# Patient Record
Sex: Female | Born: 1955 | Race: Black or African American | Hispanic: No | Marital: Married | Smoking: Never smoker
Health system: Southern US, Community
[De-identification: ages and names within clinical notes are randomized; demographics above are authoritative.]

## PROBLEM LIST (undated history)

## (undated) DIAGNOSIS — I5032 Chronic diastolic (congestive) heart failure: Secondary | ICD-10-CM

## (undated) DIAGNOSIS — I00 Rheumatic fever without heart involvement: Secondary | ICD-10-CM

## (undated) DIAGNOSIS — I071 Rheumatic tricuspid insufficiency: Secondary | ICD-10-CM

## (undated) DIAGNOSIS — I4819 Other persistent atrial fibrillation: Secondary | ICD-10-CM

## (undated) DIAGNOSIS — Z9289 Personal history of other medical treatment: Secondary | ICD-10-CM

## (undated) DIAGNOSIS — R0989 Other specified symptoms and signs involving the circulatory and respiratory systems: Secondary | ICD-10-CM

## (undated) DIAGNOSIS — K621 Rectal polyp: Secondary | ICD-10-CM

## (undated) DIAGNOSIS — E669 Obesity, unspecified: Secondary | ICD-10-CM

## (undated) DIAGNOSIS — Z954 Presence of other heart-valve replacement: Secondary | ICD-10-CM

## (undated) DIAGNOSIS — I700 Atherosclerosis of aorta: Secondary | ICD-10-CM

## (undated) DIAGNOSIS — I10 Essential (primary) hypertension: Secondary | ICD-10-CM

## (undated) DIAGNOSIS — R9389 Abnormal findings on diagnostic imaging of other specified body structures: Secondary | ICD-10-CM

## (undated) DIAGNOSIS — Z9889 Other specified postprocedural states: Secondary | ICD-10-CM

## (undated) DIAGNOSIS — E7800 Pure hypercholesterolemia, unspecified: Secondary | ICD-10-CM

## (undated) DIAGNOSIS — R9430 Abnormal result of cardiovascular function study, unspecified: Secondary | ICD-10-CM

## (undated) DIAGNOSIS — I4892 Unspecified atrial flutter: Secondary | ICD-10-CM

## (undated) DIAGNOSIS — Z923 Personal history of irradiation: Secondary | ICD-10-CM

## (undated) DIAGNOSIS — I4891 Unspecified atrial fibrillation: Secondary | ICD-10-CM

## (undated) DIAGNOSIS — R0602 Shortness of breath: Secondary | ICD-10-CM

## (undated) DIAGNOSIS — R011 Cardiac murmur, unspecified: Secondary | ICD-10-CM

## (undated) DIAGNOSIS — IMO0002 Reserved for concepts with insufficient information to code with codable children: Secondary | ICD-10-CM

## (undated) DIAGNOSIS — D649 Anemia, unspecified: Secondary | ICD-10-CM

## (undated) DIAGNOSIS — G4730 Sleep apnea, unspecified: Secondary | ICD-10-CM

## (undated) DIAGNOSIS — I517 Cardiomegaly: Secondary | ICD-10-CM

## (undated) DIAGNOSIS — Z8679 Personal history of other diseases of the circulatory system: Secondary | ICD-10-CM

## (undated) DIAGNOSIS — I340 Nonrheumatic mitral (valve) insufficiency: Secondary | ICD-10-CM

## (undated) DIAGNOSIS — C55 Malignant neoplasm of uterus, part unspecified: Secondary | ICD-10-CM

## (undated) DIAGNOSIS — J9801 Acute bronchospasm: Secondary | ICD-10-CM

## (undated) HISTORY — DX: Abnormal findings on diagnostic imaging of other specified body structures: R93.89

## (undated) HISTORY — DX: Acute bronchospasm: J98.01

## (undated) HISTORY — DX: Rheumatic tricuspid insufficiency: I07.1

## (undated) HISTORY — PX: TUBAL LIGATION: SHX77

## (undated) HISTORY — PX: CARDIAC CATHETERIZATION: SHX172

## (undated) HISTORY — DX: Personal history of irradiation: Z92.3

## (undated) HISTORY — DX: Obesity, unspecified: E66.9

## (undated) HISTORY — DX: Cardiomegaly: I51.7

## (undated) HISTORY — DX: Other specified symptoms and signs involving the circulatory and respiratory systems: R09.89

## (undated) HISTORY — DX: Pure hypercholesterolemia, unspecified: E78.00

## (undated) HISTORY — PX: NO PAST SURGERIES: SHX2092

## (undated) HISTORY — DX: Rheumatic fever without heart involvement: I00

## (undated) HISTORY — DX: Chronic diastolic (congestive) heart failure: I50.32

## (undated) HISTORY — DX: Cardiac murmur, unspecified: R01.1

## (undated) HISTORY — DX: Other persistent atrial fibrillation: I48.19

## (undated) HISTORY — DX: Rectal polyp: K62.1

## (undated) HISTORY — DX: Unspecified atrial fibrillation: I48.91

---

## 1996-01-11 DIAGNOSIS — J9801 Acute bronchospasm: Secondary | ICD-10-CM

## 1996-01-11 HISTORY — DX: Acute bronchospasm: J98.01

## 1999-02-16 ENCOUNTER — Encounter: Admission: RE | Admit: 1999-02-16 | Discharge: 1999-02-16 | Payer: Self-pay | Admitting: Family Medicine

## 1999-02-16 ENCOUNTER — Encounter: Payer: Self-pay | Admitting: Family Medicine

## 2000-04-25 ENCOUNTER — Encounter: Admission: RE | Admit: 2000-04-25 | Discharge: 2000-04-25 | Payer: Self-pay | Admitting: Family Medicine

## 2000-04-25 ENCOUNTER — Encounter: Payer: Self-pay | Admitting: Family Medicine

## 2001-04-16 ENCOUNTER — Other Ambulatory Visit: Admission: RE | Admit: 2001-04-16 | Discharge: 2001-04-16 | Payer: Self-pay | Admitting: Family Medicine

## 2002-07-01 ENCOUNTER — Other Ambulatory Visit: Admission: RE | Admit: 2002-07-01 | Discharge: 2002-07-01 | Payer: Self-pay | Admitting: Family Medicine

## 2002-11-11 ENCOUNTER — Encounter: Admission: RE | Admit: 2002-11-11 | Discharge: 2002-11-11 | Payer: Self-pay | Admitting: Family Medicine

## 2003-11-05 ENCOUNTER — Other Ambulatory Visit: Admission: RE | Admit: 2003-11-05 | Discharge: 2003-11-05 | Payer: Self-pay | Admitting: Family Medicine

## 2004-11-04 ENCOUNTER — Other Ambulatory Visit: Admission: RE | Admit: 2004-11-04 | Discharge: 2004-11-04 | Payer: Self-pay | Admitting: Family Medicine

## 2004-12-08 ENCOUNTER — Encounter: Admission: RE | Admit: 2004-12-08 | Discharge: 2004-12-08 | Payer: Self-pay | Admitting: Family Medicine

## 2005-05-24 ENCOUNTER — Other Ambulatory Visit: Admission: RE | Admit: 2005-05-24 | Discharge: 2005-05-24 | Payer: Self-pay | Admitting: Family Medicine

## 2005-12-22 ENCOUNTER — Encounter: Admission: RE | Admit: 2005-12-22 | Discharge: 2005-12-22 | Payer: Self-pay | Admitting: Family Medicine

## 2006-01-05 ENCOUNTER — Ambulatory Visit (HOSPITAL_COMMUNITY): Admission: RE | Admit: 2006-01-05 | Discharge: 2006-01-05 | Payer: Self-pay | Admitting: Interventional Cardiology

## 2006-01-09 ENCOUNTER — Encounter: Admission: RE | Admit: 2006-01-09 | Discharge: 2006-01-09 | Payer: Self-pay | Admitting: Interventional Cardiology

## 2006-11-13 ENCOUNTER — Other Ambulatory Visit: Admission: RE | Admit: 2006-11-13 | Discharge: 2006-11-13 | Payer: Self-pay | Admitting: Family Medicine

## 2007-01-25 ENCOUNTER — Encounter: Admission: RE | Admit: 2007-01-25 | Discharge: 2007-01-25 | Payer: Self-pay | Admitting: Sports Medicine

## 2007-11-26 ENCOUNTER — Other Ambulatory Visit: Admission: RE | Admit: 2007-11-26 | Discharge: 2007-11-26 | Payer: Self-pay | Admitting: Family Medicine

## 2008-01-28 ENCOUNTER — Encounter: Admission: RE | Admit: 2008-01-28 | Discharge: 2008-01-28 | Payer: Self-pay | Admitting: Family Medicine

## 2008-12-15 ENCOUNTER — Other Ambulatory Visit: Admission: RE | Admit: 2008-12-15 | Discharge: 2008-12-15 | Payer: Self-pay | Admitting: Family Medicine

## 2010-02-10 ENCOUNTER — Other Ambulatory Visit (HOSPITAL_COMMUNITY)
Admission: RE | Admit: 2010-02-10 | Discharge: 2010-02-10 | Disposition: A | Discharge status: Home / Self Care | Payer: 59 | Source: Ambulatory Visit | Attending: Advanced Practice Midwife | Admitting: Advanced Practice Midwife

## 2010-02-10 ENCOUNTER — Other Ambulatory Visit: Payer: Self-pay | Admitting: Physician Assistant

## 2010-02-10 DIAGNOSIS — Z01419 Encounter for gynecological examination (general) (routine) without abnormal findings: Secondary | ICD-10-CM | POA: Insufficient documentation

## 2010-05-28 NOTE — Cardiovascular Report (Signed)
[name], [name]                  ACCOUNT NO.:  [id number]   MEDICAL RECORD NO.:  [id number]          PATIENT TYPE:  OIB   LOCATION:  2899                         FACILITY:  [facility]   PHYSICIAN:  Corky Crafts, MDDATE OF BIRTH:  1956/01/04   DATE OF PROCEDURE:  01/05/2006  DATE OF DISCHARGE:                            CARDIAC CATHETERIZATION   REFERRING PHYSICIAN:  Dr. Bryan Lemma. [place].   PROCEDURES PERFORMED:  Left LV gram, left heart catheterization,  coronary angiogram, abdominal angiogram.   OPERATOR:  Corky Crafts, MD   INDICATIONS:  Abnormal stress test with dyspnea on exertion in a patient  with multiple risk factors including diabetes.   PROCEDURE NARRATIVE:  The risks and benefits of cardiac catheterization  were explained to the patient and informed consent was obtained.  The  patient was brought to the cath lab.  She was prepped and draped in the  usual sterile fashion.  The right groin was infiltrated 1% lidocaine.  A  6-French arterial sheath was placed into right femoral artery using  modified Seldinger technique.  Left coronary artery angiography was  performed using a JL-4 catheter.  The catheter was advanced to the  vessel ostium under fluoroscopic guidance.  Digital angiography was  performed in multiple projections using hand injection of contrast.  The  right coronary artery angiography was then performed the catheter was  advanced to the vessel ostium under fluoroscopic guidance.  Digital  angiography was performed in multiple projections using hand injection  of contrast the pigtail catheter was then advanced to the aortic valve  and across into the left ventricle.  A third-degree RAO power injection  of contrast was performed to image the left ventricle.  The catheter was  pulled back under continuous hemodynamic pressure monitoring.  The  catheter was then pulled back to the level of the renal arteries.  AP  projection power injection of  contrast was performed to visualize the  renal arteries and infrarenal abdominal aorta.  The sheath was removed  and a Angio-Seal was deployed for hemostasis.  The patient does a lot of  heavy lifting with her job, hopefully this will increase the stability  of her groin when she returns to work.   FINDINGS:  Left main is a large vessel and angiographically normal.  The  left circumflex is a large vessel and angiographically normal.  The OM-1  is a large vessel and also is angiographically normal.  The ramus was a  medium-sized vessel and angiographically normal.  The left anterior  descending is a large vessel and angiographically normal.  There is a  medium-sized first diagonal which is angiographically normal.  The right  coronary artery is a large dominant vessel and angiographically normal.  The left ventriculogram shows normal left ventricular function.  The  ejection fraction is 60%.  There is no significant mitral regurgitation.  The abdominal aorta shows no abdominal aortic aneurysm.  There are  single renal arteries bilaterally.  There is a 30% left ostial renal  artery stenosis.  The right renal artery is normal.   HEMODYNAMICS:  Left ventricular pressure 140/13 with an LVEDP of 24  mmHg.  Aortic pressure 140/80 with a mean aortic pressure of 105 mmHg.   IMPRESSION:  1. No significant coronary artery disease.  2. Increased left ventricular end-diastolic pressure.  3. Normal left ventricular systolic function.  4. Mild left renal artery stenosis   RECOMMENDATIONS:  Continue aggressive medical therapy including blood  pressure reduction.  I think she would benefit from a higher dose of  diuretic as well.  We will start this after she is recovered from her  catheterization.  She needs to continue aggressive risk factor  modification including blood sugar control and weight loss.  I would  also recommend an 81 mg aspirin if she has no other contraindications.  The patient  will follow-up with myself and Bryan Lemma. Manus Gunning, M.D.      Corky Crafts, MD  Electronically Signed     JSV/MEDQ  D:  01/05/2006  T:  01/05/2006  Job:  [id number]

## 2010-05-28 NOTE — H&P (Signed)
[name], [name]                  ACCOUNT NO.:  [id number]   MEDICAL RECORD NO.:  [id number]          PATIENT TYPE:  OIB   LOCATION:  2899                         FACILITY:  [facility]   PHYSICIAN:  Abelino Derrick, P.A.   DATE OF BIRTH:  27-Dec-1955   DATE OF ADMISSION:  01/05/2006  DATE OF DISCHARGE:  01/05/2006                              HISTORY & PHYSICAL   ADDENDUM:  Ms. Steenson was also on Ranexa 250 mg once a day at home.  I am not sure  about her metoprolol dose; our office records say 100 mg once a day, but  her records say 100 mg twice a day.      Abelino Derrick, P.ALenard Lance  D:  05/10/2006  T:  05/10/2006  Job:  [id number]

## 2010-09-11 HISTORY — PX: CARDIOVASCULAR STRESS TEST: SHX262

## 2010-09-17 ENCOUNTER — Inpatient Hospital Stay (HOSPITAL_COMMUNITY)
Admission: EM | Admit: 2010-09-17 | Discharge: 2010-09-21 | DRG: 308 | Disposition: A | Discharge status: Home / Self Care | Payer: 59 | Attending: Internal Medicine | Admitting: Internal Medicine

## 2010-09-17 ENCOUNTER — Emergency Department (HOSPITAL_COMMUNITY): Payer: 59

## 2010-09-17 DIAGNOSIS — E785 Hyperlipidemia, unspecified: Secondary | ICD-10-CM | POA: Diagnosis present

## 2010-09-17 DIAGNOSIS — Z794 Long term (current) use of insulin: Secondary | ICD-10-CM

## 2010-09-17 DIAGNOSIS — Z7901 Long term (current) use of anticoagulants: Secondary | ICD-10-CM

## 2010-09-17 DIAGNOSIS — I10 Essential (primary) hypertension: Secondary | ICD-10-CM | POA: Diagnosis present

## 2010-09-17 DIAGNOSIS — R82998 Other abnormal findings in urine: Secondary | ICD-10-CM | POA: Diagnosis present

## 2010-09-17 DIAGNOSIS — I4891 Unspecified atrial fibrillation: Principal | ICD-10-CM | POA: Diagnosis present

## 2010-09-17 DIAGNOSIS — IMO0001 Reserved for inherently not codable concepts without codable children: Secondary | ICD-10-CM | POA: Diagnosis present

## 2010-09-17 DIAGNOSIS — I509 Heart failure, unspecified: Secondary | ICD-10-CM | POA: Diagnosis present

## 2010-09-17 DIAGNOSIS — R599 Enlarged lymph nodes, unspecified: Secondary | ICD-10-CM | POA: Diagnosis present

## 2010-09-17 DIAGNOSIS — I5031 Acute diastolic (congestive) heart failure: Secondary | ICD-10-CM | POA: Diagnosis present

## 2010-09-17 DIAGNOSIS — Z79899 Other long term (current) drug therapy: Secondary | ICD-10-CM

## 2010-09-17 HISTORY — DX: Essential (primary) hypertension: I10

## 2010-09-17 LAB — URINALYSIS, ROUTINE W REFLEX MICROSCOPIC
Bilirubin Urine: NEGATIVE
Hgb urine dipstick: NEGATIVE
Ketones, ur: NEGATIVE mg/dL
Protein, ur: 30 mg/dL — AB
Specific Gravity, Urine: 1.032 — ABNORMAL HIGH (ref 1.005–1.030)
pH: 5 (ref 5.0–8.0)

## 2010-09-17 LAB — DIFFERENTIAL
Basophils Absolute: 0 10*3/uL (ref 0.0–0.1)
Eosinophils Absolute: 0.2 10*3/uL (ref 0.0–0.7)
Eosinophils Relative: 3 % (ref 0–5)
Lymphocytes Relative: 30 % (ref 12–46)
Monocytes Relative: 9 % (ref 3–12)
Neutro Abs: 4 10*3/uL (ref 1.7–7.7)
Neutrophils Relative %: 58 % (ref 43–77)

## 2010-09-17 LAB — CARDIAC PANEL(CRET KIN+CKTOT+MB+TROPI): Relative Index: 2.9 — ABNORMAL HIGH (ref 0.0–2.5)

## 2010-09-17 LAB — POCT I-STAT TROPONIN I

## 2010-09-17 LAB — COMPREHENSIVE METABOLIC PANEL
ALT: 30 U/L (ref 0–35)
Albumin: 3.5 g/dL (ref 3.5–5.2)
CO2: 27 mEq/L (ref 19–32)
Creatinine, Ser: 0.67 mg/dL (ref 0.50–1.10)
Glucose, Bld: 234 mg/dL — ABNORMAL HIGH (ref 70–99)
Potassium: 4 mEq/L (ref 3.5–5.1)
Sodium: 142 mEq/L (ref 135–145)

## 2010-09-17 LAB — CBC
HCT: 36.4 % (ref 36.0–46.0)
RDW: 12.9 % (ref 11.5–15.5)

## 2010-09-17 LAB — PRO B NATRIURETIC PEPTIDE: Pro B Natriuretic peptide (BNP): 1088 pg/mL — ABNORMAL HIGH (ref 0–125)

## 2010-09-17 LAB — URINE MICROSCOPIC-ADD ON

## 2010-09-17 LAB — GLUCOSE, CAPILLARY

## 2010-09-18 ENCOUNTER — Encounter (HOSPITAL_COMMUNITY): Payer: Self-pay | Admitting: Radiology

## 2010-09-18 ENCOUNTER — Inpatient Hospital Stay (HOSPITAL_COMMUNITY): Payer: 59

## 2010-09-18 LAB — PROTIME-INR
INR: 1.13 (ref 0.00–1.49)
Prothrombin Time: 14.7 seconds (ref 11.6–15.2)

## 2010-09-18 LAB — DIFFERENTIAL
Basophils Absolute: 0 10*3/uL (ref 0.0–0.1)
Basophils Relative: 0 % (ref 0–1)
Eosinophils Absolute: 0.2 10*3/uL (ref 0.0–0.7)
Eosinophils Relative: 3 % (ref 0–5)
Lymphocytes Relative: 32 % (ref 12–46)
Lymphs Abs: 2.4 10*3/uL (ref 0.7–4.0)
Monocytes Absolute: 0.6 10*3/uL (ref 0.1–1.0)
Monocytes Relative: 8 % (ref 3–12)
Neutro Abs: 4.2 10*3/uL (ref 1.7–7.7)
Neutrophils Relative %: 57 % (ref 43–77)

## 2010-09-18 LAB — CARDIAC PANEL(CRET KIN+CKTOT+MB+TROPI)
CK, MB: 3.4 ng/mL (ref 0.3–4.0)
Relative Index: 2.9 — ABNORMAL HIGH (ref 0.0–2.5)
Total CK: 118 U/L (ref 7–177)
Troponin I: <0.3 ng/mL

## 2010-09-18 LAB — HEPARIN LEVEL (UNFRACTIONATED)
Heparin Unfractionated: 0.28 [IU]/mL — ABNORMAL LOW (ref 0.30–0.70)
Heparin Unfractionated: 0.3 IU/mL (ref 0.30–0.70)
Heparin Unfractionated: 0.39 IU/mL (ref 0.30–0.70)

## 2010-09-18 LAB — GLUCOSE, CAPILLARY: Glucose-Capillary: 216 mg/dL — ABNORMAL HIGH (ref 70–99)

## 2010-09-18 LAB — LIPID PANEL
Cholesterol: 133 mg/dL (ref 0–200)
HDL: 25 mg/dL — ABNORMAL LOW
LDL Cholesterol: 88 mg/dL (ref 0–99)
Total CHOL/HDL Ratio: 5.3 ratio
Triglycerides: 100 mg/dL
VLDL: 20 mg/dL (ref 0–40)

## 2010-09-18 LAB — CBC
RDW: 13 % (ref 11.5–15.5)
WBC: 7.4 10*3/uL (ref 4.0–10.5)

## 2010-09-18 LAB — MAGNESIUM: Magnesium: 2 mg/dL (ref 1.5–2.5)

## 2010-09-18 LAB — COMPREHENSIVE METABOLIC PANEL
AST: 29 U/L (ref 0–37)
Albumin: 3.4 g/dL — ABNORMAL LOW (ref 3.5–5.2)
Alkaline Phosphatase: 110 U/L (ref 39–117)
BUN: 16 mg/dL (ref 6–23)
CO2: 24 mEq/L (ref 19–32)
Chloride: 106 mEq/L (ref 96–112)
Potassium: 4.4 mEq/L (ref 3.5–5.1)
Total Bilirubin: 0.3 mg/dL (ref 0.3–1.2)

## 2010-09-18 LAB — TSH: TSH: 1.864 u[IU]/mL (ref 0.350–4.500)

## 2010-09-18 LAB — APTT: aPTT: 61 seconds — ABNORMAL HIGH (ref 24–37)

## 2010-09-18 MED ORDER — IOHEXOL 300 MG/ML  SOLN
80.0000 mL | Freq: Once | INTRAMUSCULAR | Status: AC | PRN
  Administered 2010-09-18: 80 mL via INTRAVENOUS

## 2010-09-19 DIAGNOSIS — I509 Heart failure, unspecified: Secondary | ICD-10-CM

## 2010-09-19 DIAGNOSIS — R599 Enlarged lymph nodes, unspecified: Secondary | ICD-10-CM

## 2010-09-19 LAB — BASIC METABOLIC PANEL
BUN: 13 mg/dL (ref 6–23)
Creatinine, Ser: 0.6 mg/dL (ref 0.50–1.10)
GFR calc Af Amer: >60 mL/min (ref >60)
GFR calc non Af Amer: >60 mL/min (ref >60)

## 2010-09-19 LAB — URINE CULTURE: Culture  Setup Time: 201209081343

## 2010-09-19 LAB — GLUCOSE, CAPILLARY
Glucose-Capillary: 253 mg/dL — ABNORMAL HIGH (ref 70–99)
Glucose-Capillary: 254 mg/dL — ABNORMAL HIGH (ref 70–99)

## 2010-09-19 LAB — CBC
MCH: 31.3 pg (ref 26.0–34.0)
MCV: 91.5 fL (ref 78.0–100.0)
Platelets: 138 10*3/uL — ABNORMAL LOW (ref 150–400)
RDW: 12.9 % (ref 11.5–15.5)

## 2010-09-20 ENCOUNTER — Inpatient Hospital Stay (HOSPITAL_COMMUNITY): Payer: 59

## 2010-09-20 ENCOUNTER — Other Ambulatory Visit (HOSPITAL_COMMUNITY): Payer: 59

## 2010-09-20 LAB — GLUCOSE, CAPILLARY: Glucose-Capillary: 118 mg/dL — ABNORMAL HIGH (ref 70–99)

## 2010-09-20 NOTE — H&P (Signed)
[name], [name]                  ACCOUNT NO.:  [id number]  MEDICAL RECORD NO.:  [id number]  LOCATION:  [facility]                         FACILITY:  [facility]  PHYSICIAN:  Kathlen Mody, MD       DATE OF BIRTH:  1955/07/20  DATE OF ADMISSION:  09/17/2010 DATE OF DISCHARGE:                             HISTORY & PHYSICAL   PRIMARY CARE PHYSICIAN:  Dr. Gus Puma.  CHIEF COMPLAINT:  Chest pain, shortness of breath since last Friday.  HISTORY OF PRESENT ILLNESS:  This is a 55 year old lady with past medical history of hypertension, hyperlipidemia, and diabetes, came in with worsening chest pain and shortness of breath and palpitations, started last Friday.  The patient was cutting glass and she felt diaphoretic, had trouble breathing with some precordial chest pain, which resolved on rest.  The patient had similar complaints intermittently from Friday until today.  Today, she had worsening chest pain associated with shortness of breath and palpitations and diaphoresis lasted for a few minutes again at work.  She has orthopnea, uses about 3 pillows to sleep, denies any PND.  The patient denies any syncopal episodes.  Denies fever, chills, or cough.  The patient had a cardiac catheterization done in 2007, which was negative for any significant stenosis.  The patient also noticed bilateral pedal edema over the last 1-2 weeks.  The patient denies any nausea, vomiting, abdominal pain, diarrhea, or urinary complaints.  The patient denies any headache or blurry vision.  Denies any tingling or numbness.  Denies any focal weakness.  In the ER, the patient was found to be in atrial fibrillation with RVR.  She does not have a history of AFib.  She was started on IV Cardizem drip.  Her atrial fibrillation is rate controlled.  She is also started on IV heparin, and Cardiology consult from [facility] called and are on board.  REVIEW OF SYSTEMS:  See HPI, otherwise negative.  PAST MEDICAL HISTORY:   Hypertension, hyperlipidemia, and diabetes.  PAST SURGICAL HISTORY:  None.  SOCIAL HISTORY:  Denies smoking, ETOH, or recreational drug use.  ALLERGIES:  No known drug allergies.  HOME MEDICATIONS: 1. Albuterol. 2. Nebivolol 5 mg daily. 3. Lantus 20 mg at bedtime. 4. Lipitor. 5. Diovan 320 mg. 6. Fish oil 1 tablet daily.  PHYSICAL EXAMINATION:  VITAL SIGNS:  Temperature of 98.1; when she came in, her pulse rate was 127, pulse rate at this time is 76 per minute; blood pressure 127/82; respiratory rate 16; saturating 95% on room air. GENERAL:  She is alert, afebrile, oriented x3, in no acute distress. HEENT:  No JVD.  No scleral icterus. CARDIOVASCULAR:  S1, S2 heard.  Irregularly irregular.  No murmurs. RESPIRATORY:  Chest is clear to auscultation bilaterally. ABDOMEN:  Soft, nontender, nondistended.  Bowel sounds are heard. EXTREMITIES:  Pedal edema 1+ present.  No cyanosis or clubbing. NEUROLOGIC:  Intact.  No focal deficits.  DIAGNOSTIC STUDIES:  The patient had a 2-view chest x-ray, shows nodular hila suspicious for hilar and possibly mediastinal adenopathy. Considered CT of chest with contrast.  No pneumonia.  Stable mild cardiomegaly.  Questionable small right pleural effusion.  PERTINENT LABS:  The  patient had a CBC, which was within normal limits. Point-of-care troponins negative.  INR was 1.05.  Comprehensive metabolic panel was significant for a glucose of 234, Pro-BNP elevated with a value of 1088.  Urinalysis showed small leukocytes with many bacteria.  ASSESSMENT/PLAN:  New-onset atrial fibrillation, currently on Cardizem drip and heparin drip.  The patient is rate controlled.  Cardiology consult from [facility] on board.  The patient will be admitted to Telemetry.  Further recommendations as per our clinical course and recommendations from Cardiology.  Chest pain, shortness of breath, elevated Pro-BNP, and also rule out acute coronary syndrome, get 3  sets of cardiac enzymes and an echocardiogram.  She had a cardiac cath in 2007, which showed no significant coronary artery disease with normal left ventricular systolic function.  Abnormal chest x-ray with a possible mediastinal adenopathy.  We will get a CT chest with contrast to evaluate the mediastinal adenopathy.  Abnormal urinalysis.  Urinalysis showing small leukocytes with many bacteria.  She does not have any urinary symptoms right now.  We will get a UA and culture.  Empirically start her on IV Rocephin.  Hypertension, blood pressure controlled.  Continue with her home medications.  Hyperlipidemia.  Get a fasting lipid profile and continue with her Lipitor.  Diabetes.  I am going to get a hemoglobin A1c, continue with her 20 units of Lantus at bedtime, put her on a sliding scale with sensitive scale coverage.  Deep venous thrombosis prophylaxis, on IV heparin.  The patient is full code.          ______________________________ Kathlen Mody, MD    VA/MEDQ  D:  09/17/2010  T:  09/17/2010  Job:  [id number]  Electronically Signed by Kathlen Mody MD on 09/20/2010 02:14:35 PM

## 2010-09-21 ENCOUNTER — Inpatient Hospital Stay (HOSPITAL_COMMUNITY): Payer: 59

## 2010-09-21 LAB — GLUCOSE, CAPILLARY
Glucose-Capillary: 292 mg/dL — ABNORMAL HIGH (ref 70–99)
Glucose-Capillary: 260 mg/dL — ABNORMAL HIGH (ref 70–99)

## 2010-09-21 LAB — BASIC METABOLIC PANEL
BUN: 20 mg/dL (ref 6–23)
CO2: 31 mEq/L (ref 19–32)
Chloride: 105 mEq/L (ref 96–112)
Glucose, Bld: 261 mg/dL — ABNORMAL HIGH (ref 70–99)
Potassium: 4 mEq/L (ref 3.5–5.1)

## 2010-09-21 MED ORDER — TECHNETIUM TC 99M TETROFOSMIN IV KIT
10.0000 | PACK | Freq: Once | INTRAVENOUS | Status: AC | PRN
  Administered 2010-09-21: 10 via INTRAVENOUS

## 2010-09-21 MED ORDER — TECHNETIUM TC 99M TETROFOSMIN IV KIT
30.0000 | PACK | Freq: Once | INTRAVENOUS | Status: AC | PRN
  Administered 2010-09-21: 30 via INTRAVENOUS

## 2010-09-26 NOTE — Consult Note (Signed)
[name], GERVASI                  ACCOUNT NO.:  [id number]  MEDICAL RECORD NO.:  [id number]  LOCATION:  2027                         FACILITY:  [facility]  PHYSICIAN:  Armanda Magic, M.D.     DATE OF BIRTH:  September 08, 1955  DATE OF CONSULTATION:  09/18/2010 DATE OF DISCHARGE:                                CONSULTATION   REFERRING PHYSICIAN:  [facility], Dr. Cleotis Lema.  PRIMARY CARDIOLOGIST:  Corky Crafts, MD  CHIEF COMPLAINT:  Chest pain with atrial fibrillation with RVR.  HISTORY OF PRESENT ILLNESS:  This is a 55 year old African American female with a history of normal coronary arteries by cath in 2007, diabetes mellitus, hypertension, presented with sharp chest pain and shortness of breath.  She states that last Friday she was out, moving her yard and had sharp chest pain and problems breathing which resolved with rest.  She was diaphoretic at that time and said she came in and took shower and felt better after her shower.  She did not have any problems, whereas she had similar complaints intermittently from that day and still the day of admission.  Yesterday, she had worsening chest pain again sharp, associated shortness of breath and palpitations and diaphoresis, lasting a few minutes at work.  She said she was having some orthopnea and uses about 3 pillows to sleep, but had no PND.  She denied any dizziness or syncope, fever, chills, or cough.  She also has noted bilateral pedal edema over the past 1 or 2 weeks.  She presented to the emergency room with rapid AFib, was started on IV Cardizem drip and IV heparin.  We are now asked to consult for further evaluation.  PAST MEDICAL HISTORY:  Hypertension, dyslipidemia, diabetes.  PAST SURGICAL HISTORY:  None.  SOCIAL HISTORY:  She denies any tobacco or alcohol use.  No IV drug use.  ALLERGIES:  She has no known drug allergies.  FAMILY HISTORY:  Noncontributory.  HOME MEDICATION: 1. Albuterol neb. 2.  Bisoprolol 5 mg daily. 3. Lantus 20 mg at bedtime. 4. Lipitor. 5. Diovan 320 mg daily. 6. Fish oil tablets.  REVIEW OF SYSTEMS:  Otherwise stated in the HPI is negative.  PHYSICAL EXAMINATION:  VITAL SIGNS:  Blood pressure is 125/72 and heart rate 80. GENERAL:  She is afebrile with a well-developed, well-nourished female, in no acute distress. HEENT:  Benign. NECK:  Supple without lymphadenopathy.  Carotid upstrokes are +2 bilaterally.  No bruits LUNGS:  Clear to auscultation throughout. HEART:  Rate is regular.  No murmurs, rubs, or gallops.  Normal S1 and S2. ABDOMEN:  Benign. EXTREMITIES:  No edema.  LABORATORY DATA:  Sodium 139, potassium 4.4, chloride 106, bicarb 24, BUN 16, creatinine 0.93, white cell count 7.4, hemoglobin 12.2, hematocrit 36.5, and platelet count 153.  CPKs are 123 and 118.  MB 3.6, 3.4.  Troponin less than 0.3 x2.  BNP 1088.  Chest x-ray shows nodular hilum with questionable mediastinal adenopathy.  EKG shows atrial flutter with variable block.  No ST changes.  QTC 465 milliseconds.  ASSESSMENT: 1. New-onset atrial fibrillation, status post flutter of unknown     duration, rate controlled on  IV Cardizem. 2. Systemic anticoagulation, on IV heparin. 3. Chest pain atypical with negative cardiac enzymes and a history of     normal coronary arteries by cath in 2007. 4. Hypertension controlled. 5. Diabetes. 6. Acute diastolic heart failure secondary to new-onset atrial     fibrillation.  PLAN:  IV diuresis.  Check BNP and BMET in the morning.  Check 2-D echocardiogram to evaluate LV function, await results of chest CT.  If chest CT is okay and 2-D echocardiogram shows normal LV function, we will change IV heparin to Xarelto.  We will go ahead and stop IV Cardizem and put her on Cardizem CD 180 mg daily.     Armanda Magic, M.D.     TT/MEDQ  D:  09/18/2010  T:  09/18/2010  Job:  [id number]  cc:   Corky Crafts, MD  Electronically Signed by  Armanda Magic M.D. on 09/26/2010 03:17:24 PM

## 2010-10-15 ENCOUNTER — Encounter: Payer: Self-pay | Admitting: Emergency Medicine

## 2010-10-18 ENCOUNTER — Encounter: Payer: Self-pay | Admitting: Emergency Medicine

## 2010-10-18 ENCOUNTER — Ambulatory Visit (INDEPENDENT_AMBULATORY_CARE_PROVIDER_SITE_OTHER): Payer: 59 | Admitting: Emergency Medicine

## 2010-10-18 DIAGNOSIS — I10 Essential (primary) hypertension: Secondary | ICD-10-CM

## 2010-10-18 DIAGNOSIS — E7800 Pure hypercholesterolemia, unspecified: Secondary | ICD-10-CM | POA: Insufficient documentation

## 2010-10-18 DIAGNOSIS — R599 Enlarged lymph nodes, unspecified: Secondary | ICD-10-CM

## 2010-10-18 DIAGNOSIS — R590 Localized enlarged lymph nodes: Secondary | ICD-10-CM | POA: Insufficient documentation

## 2010-10-18 DIAGNOSIS — E119 Type 2 diabetes mellitus without complications: Secondary | ICD-10-CM | POA: Insufficient documentation

## 2010-10-18 DIAGNOSIS — J45909 Unspecified asthma, uncomplicated: Secondary | ICD-10-CM | POA: Insufficient documentation

## 2010-10-18 NOTE — Assessment & Plan Note (Signed)
By CT scan 9/12, etiology unclear.  - repeat CT scan now - plan FOB + EBUS if nodes persist

## 2010-10-18 NOTE — Patient Instructions (Signed)
Will repeat your CT scan of the chest Follow up with Dr Delton Coombes to review the results

## 2010-10-18 NOTE — Assessment & Plan Note (Signed)
-   plan PFT at future date

## 2010-10-18 NOTE — Progress Notes (Signed)
Subjective:    Patient ID: [name], female    DOB: 04-May-1955, 55 y.o.   MRN: [id number]  HPI 55 yo woman, hx HTN, DM, never smoker. Also carries dx asthma since 25's. Was admitted to [facility] 9/12 for A Fib + RVR. Part of the eval included CT-PA that showed no PE but paratracheal node and perihiliar nodes enlarged.    Review of Systems  Constitutional: Negative.  Negative for fever and unexpected weight change.  HENT: Negative.  Negative for ear pain, nosebleeds, congestion, sore throat, rhinorrhea, sneezing, trouble swallowing, dental problem, postnasal drip and sinus pressure.   Eyes: Negative.  Negative for redness and itching.  Respiratory: Positive for shortness of breath. Negative for cough, chest tightness and wheezing.   Cardiovascular: Positive for leg swelling. Negative for palpitations.  Gastrointestinal: Negative.  Negative for nausea and vomiting.  Genitourinary: Negative.  Negative for dysuria.  Musculoskeletal: Negative.  Negative for joint swelling.  Skin: Negative.  Negative for rash.  Neurological: Negative.  Negative for headaches.  Hematological: Negative.  Does not bruise/bleed easily.  Psychiatric/Behavioral: Negative.  Negative for dysphoric mood. The patient is not nervous/anxious.     Past Medical History  Diagnosis Date  . Hypertension   . Heart murmur   . Obesity   . Bronchospasm   . Carotid bruit   . Hypercholesterolemia   . Polyp of rectum   . Diabetes mellitus   . CHF (congestive heart failure)   . Abnormal chest CT   . Atrial fibrillation   . Pyuria      Family History  Problem Relation Age of Onset  . Asthma Mother      History   Social History  . Marital Status: Unknown    Spouse Name: N/A    Number of Children: N/A  . 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 Smoking status: Never Smoker   . Smokeless tobacco: Not on file  . Alcohol Use: No  .  Drug Use: No  . Sexually Active: Not on file   Other Topics Concern  . Not on file   Social History Narrative  . No narrative on file     No Known Allergies   Outpatient Prescriptions Prior to Visit  Medication Sig Dispense Refill  . albuterol (VENTOLIN HFA) 108 (90 BASE) MCG/ACT inhaler Inhale 2 puffs into the lungs every 6 (six) hours as needed.        [name] aspirin 81 MG tablet Take 81 mg by mouth daily.        . Calcium Carbonate (SUPER CALCIUM) 1500 MG TABS Take 1 tablet by mouth daily.        . Cholecalciferol (VITAMIN D) 1000 UNITS capsule Take 1,000 Units by mouth daily.        [name] diltiazem (CARDIZEM CD) 180 MG 24 hr capsule Take 180 mg by mouth daily.        . furosemide (LASIX) 20 MG tablet Take 20 mg by mouth daily.        . insulin glargine (LANTUS SOLOSTAR) 100 UNIT/ML injection 30 units daily. Increase by 5 units every 3 days until FBS is 120       . nebivolol (BYSTOLIC) 5 MG tablet Take 5 mg by mouth daily.        . Omega-3 Fatty Acids (FISH OIL) 1000 MG CAPS Take 1 capsule by mouth daily.        [name]  potassium chloride (KLOR-CON) 10 MEQ CR tablet Take 10 mEq by mouth daily.       . ramipril (ALTACE) 10 MG tablet Take 10 mg by mouth daily.        . Rivaroxaban 20 MG TABS Take 1 tablet by mouth daily.               Objective:   Physical Exam  Gen: Pleasant, obese woman, in no distress,  normal affect  ENT: No lesions,  mouth clear,  oropharynx clear, no postnasal drip  Neck: No JVD, no TMG, no carotid bruits  Lungs: No use of accessory muscles, no dullness to percussion, clear without rales or rhonchi  Cardiovascular: RRR, heart sounds normal, no murmur or gallops, no peripheral edema  Musculoskeletal: No deformities, no cyanosis or clubbing  Neuro: alert, non focal  Skin: Warm, no lesions or rashes     Assessment & Plan:  Mediastinal lymphadenopathy By CT scan 9/12, etiology unclear.  - repeat CT scan now - plan FOB + EBUS if nodes persist   Asthma -  plan PFT at future date

## 2010-10-20 NOTE — Discharge Summary (Signed)
NAMESHAYNAH, HUND                  ACCOUNT NO.:  [id number]  MEDICAL RECORD NO.:  [id number]  LOCATION:  2027                         FACILITY:  [facility]  PHYSICIAN:  Baltazar Najjar, MD     DATE OF BIRTH:  Aug 24, 1955  DATE OF ADMISSION:  09/17/2010 DATE OF DISCHARGE:                              DISCHARGE SUMMARY   FINAL DISCHARGE DIAGNOSES: 1. New onset atrial fibrillation. 2. Acute diastolic congestive heart failure. 3. Chest pain with negative cardiac enzymes and negative stress test     for ischemia. 4. Mediastinal/hilar adenopathy. 5. Diabetes mellitus type 2, uncontrolled. 6. History of hyperlipidemia.  CONSULTATIONS DURING THIS HOSPITALIZATION: 1. [facility]. 2. Pulmonary Service.  RADIOLOGY/IMAGING STUDIES: 1. Chest x-ray on September 17, 2010, showed nodular hilar suspicious     for hilar and possibly mediastinal adenopathy.  No pneumonia.     Stable mild cardiomegaly.  Questionable small right pleural     effusion. 2. CT of the chest with contrast done on September 18, 2010, showed     moderate-sized pleural effusion layered independently.  Mild     dependent atelectasis.  Hilar and mediastinal lymphadenopathy with     index node measured as above which was a right paratracheal node,     3.5 cm in diameter, right hilum node 2.4 cm in diameter.  No     primary pulmonary lesion is evident. 3. Lexiscan showed no inducible ischemia.  Calculated ejection     fraction of 65%.  Small fixed septal and lateral wall defect     without wall motion abnormality compatible with a small area of     scar.  BRIEF ADMITTING HISTORY:  Please refer to H and P for more details.  In summary, Ms. Shalae Belmonte is a 55 year old African American woman with a history of diabetes, hypertension, and hyperlipidemia who presented to the ER on September 17, 2010, with a chief complaint of chest pain and shortness of breath.  Please refer to H and P for more details.  HOSPITAL COURSE: 1.  The patient was admitted to the telemetry floor.  Her cardiac     enzymes were cycled, within normal range.  The patient was found to     have new-onset atrial fibrillation on EKG.  She was started     initially on heparin drip and Cardizem drip.  [facility] was     consulted and her medication was changed to diltiazem p.o. and     continued on p.o. Bystolic.  Xarelto was also added by the     Cardiology Service.  The patient is currently rate controlled. 2. Acute diastolic congestive heart failure.  The patient is currently     being diuresed with Lasix with improvement in her symptoms and     peripheral edema.  She had an echocardiogram done on September 18, 2010, that showed an EF of 55% to 60%.  PA pressure of 57 mmHg,     mildly aortic regurgitation and mild-to-moderate mitral valve     regurgitation. 3. Chest pain.  The patient had a stress test done today that showed     no inducible  ischemia, EF of 65%.  Dysmorphic septal and lateral     wall defect without wall motion abnormalities are compatible with a     small area of scar. 4. Mediastinal/hilar adenopathy.  Pulmonary Service was consulted and     they recommended outpatient followup in the office in 4 weeks to     recheck chest x-ray and possible followup CT, and if she has     persistent lymphadenopathy, then EBOS plus or minus mediastinoscopy     will be considered. 5. Diabetes mellitus type 2, uncontrolled.  Lantus dose is being     adjusted.  Lantus was increased to 25 units last night; however,     her CBG is still elevated in the 200 range.  Her hemoglobin A1c was     8.4.  Lantus was increased to 30 units q.h.s.  The patient is to     follow with her PCP for further adjustment of her insulin dose for     optimal glycemic control. 6. Pyuria.  The patient was treated with Rocephin empirically;     however, her urine culture was unremarkable.  Antibiotics will be     discontinued.  DISCHARGE MEDICATIONS: 1.  Diltiazem 180 mg CD/ER 1 tablet p.o. daily. 2. Lasix 20 mg p.o. daily. 3. Potassium chloride 10 mEq p.o. daily. 4. Xarelto at 20 mg p.o. daily with meals. 5. Lantus insulin 30 units subcutaneously q.p.m. 6. Bystolic 5 mg 1 tablet p.o. q.a.m. 7. Diovan 320 mg 1 tablet p.o. daily. 8. Fish oil 1 capsule p.o. daily. 9. Lipitor 40 mg p.o. q.h.s. 10.Ventolin 2 puffs inhaled q.4 h. P.r.n.  DISCHARGE INSTRUCTIONS: 1. The patient is to continue the above medications as prescribed. 2. The patient to follow with [facility], Dr. Eldridge Dace on     October 05, 2010, at 03:00 p.m. as arranged. 3. The patient advised to follow up with her PCP within 1 week of     discharge.  Her PCP Dr. Gus Puma. 4. The patient to follow with the pulmonary service for evaluation of     her mediastinal/hilar lymphadenopathy in 4 weeks.  She will be     provided with the phone numbers to call and make an appointment. 5. The patient is to report any worsening of symptoms or any new     symptoms to PCP or come back to the ER if needed.  Case and report of stress test discussed with Dr. Armanda Magic who cleared her for discharge to home today with arrangement for outpatient followup.  CONDITION ON DISCHARGE:  Improved.          ______________________________ Baltazar Najjar, MD     SA/MEDQ  D:  09/21/2010  T:  09/21/2010  Job:  [id number]  cc:   Dr. Floyce Stakes, MD Barbaraann Share, MD,FCCP  Electronically Signed by Hannah Beat MD on 10/20/2010 08:25:08 PM

## 2010-10-22 ENCOUNTER — Ambulatory Visit (INDEPENDENT_AMBULATORY_CARE_PROVIDER_SITE_OTHER)
Admission: RE | Admit: 2010-10-22 | Discharge: 2010-10-22 | Disposition: A | Discharge status: Home / Self Care | Payer: 59 | Source: Ambulatory Visit | Attending: Emergency Medicine | Admitting: Emergency Medicine

## 2010-10-22 DIAGNOSIS — J45909 Unspecified asthma, uncomplicated: Secondary | ICD-10-CM

## 2010-10-22 MED ORDER — IOHEXOL 300 MG/ML  SOLN
80.0000 mL | Freq: Once | INTRAMUSCULAR | Status: AC | PRN
  Administered 2010-10-22: 80 mL via INTRAVENOUS

## 2010-10-25 NOTE — Consult Note (Signed)
NAMEREGENE, MCCARTHY                  ACCOUNT NO.:  [id number]  MEDICAL RECORD NO.:  [id number]  LOCATION:  2027                         FACILITY:  [facility]  PHYSICIAN:  Barbaraann Share, MD,FCCPDATE OF BIRTH:  1955/12/28  DATE OF CONSULTATION:  09/19/2010 DATE OF DISCHARGE:                                CONSULTATION   HISTORY OF PRESENT ILLNESS:  The patient is a very pleasant 55 year old black female who I have been asked to see for mediastinal lymphadenopathy.  She was in her usual state of health with no pulmonary complaints until the end of August.  She began to have worsening shortness of breath as well as chest pain.  She ultimately presented to the emergency room and was found to have atrial fibrillation with a rapid ventricular response as well as congestive heart failure.  She underwent a CT of chest because of her chest pain, and was found to have bilateral pleural effusions, no parenchymal lung disease, and hilar and mediastinal lymphadenopathy.  There was no more peripheral lymphadenopathy noted.  The patient denies any history of sarcoid, any usual rashes, and again, had no pulmonary symptoms prior to this.  PAST MEDICAL HISTORY:  Significant for: 1. Hypertension. 2. Hyperlipidemia 3. History of diabetes.  SOCIAL HISTORY:  She has no history of smoking, alcohol use, or recreational drug use.  FAMILY HISTORY:  Noncontributory.  She has no known drug allergies.  REVIEW OF SYSTEMS:  A 14-point review of systems was unremarkable except for that listed in the history of present illness.  PHYSICAL EXAMINATION:  GENERAL:  She is an white female in no acute distress. VITAL SIGNS:  Blood pressure 120/76, pulse 69, temperature 98.2, respiratory rate is 22, her O2 sat is 97% on room air. HEENT:  Pupils are equal, round and reactive to light and accommodation. External muscles are intact.  Nares are patent without discharge. Oropharynx is clear. NECK:  Supple without JVD  or lymphadenopathies.  No palpable thyromegaly. CHEST:  Reveals decreased breath sounds in both bases with a few crackles, there was no wheezes noted. CARDIAC:  Reveals a regular rate and rhythm with a 2/6 systolic murmur. ABDOMEN:  Soft, nontender with good bowel sounds. GENITAL:  Not done and not indicated. RECTAL:  Not done and not indicated. BREAST:  Not done and not indicated. Extremities:  Lower extremity shows 1+ edema bilaterally, pulses were intact distally, no cyanosis were noted. NEUROLOGIC:  She was alert and oriented, move all four extremities without obvious deficits.  IMPRESSION:  Mediastinal and hilar lymphadenopathy.  This is most likely due to lymphatic engorgement associated with her congestive heart failure, but certainly I cannot exclude the possibility of sarcoidosis. Lymphoma is unlikely with no additional lymphadenopathy noted outside of her chest.  However, this cannot be 100% excluded.  At this point in time, I would recommend that she have her heart failure and atrial arrhythmia treated aggressively, and give her some time to resolve possible lymphatic engorgement.  If she continues to have significant lymphadenopathy at that point, she will need endobronchial ultrasound with biopsy versus mediastinoscopy.  SUGGESTIONS:  We would treat her atrial fibrillation and congestive heart failure aggressively.  The  patient is to follow up with me in the office in 4 weeks and we will recheck a chest x-ray and possibly a followup CT.  If she has significant persistent lymphadenopathy, will need EBUS plus or minus mediastinoscopy.     Barbaraann Share, MD,FCCP     [facility]  D:  09/19/2010  T:  09/20/2010  Job:  [id number]  cc:   [facility]  Electronically Signed by Marcelyn Bruins MDFCCP on 10/25/2010 01:21:26 PM

## 2010-11-08 ENCOUNTER — Ambulatory Visit (INDEPENDENT_AMBULATORY_CARE_PROVIDER_SITE_OTHER): Payer: 59 | Admitting: Emergency Medicine

## 2010-11-08 ENCOUNTER — Encounter: Payer: Self-pay | Admitting: Emergency Medicine

## 2010-11-08 DIAGNOSIS — R590 Localized enlarged lymph nodes: Secondary | ICD-10-CM

## 2010-11-08 DIAGNOSIS — J45909 Unspecified asthma, uncomplicated: Secondary | ICD-10-CM

## 2010-11-08 DIAGNOSIS — R599 Enlarged lymph nodes, unspecified: Secondary | ICD-10-CM

## 2010-11-08 NOTE — Patient Instructions (Signed)
We will set up a biopsy of your lymph nodes using Endobronchial ultrasound.  We will perform full breathing tests Use your albuterol 2 puffs as needed Follow up with Dr Delton Coombes in 1 month

## 2010-11-08 NOTE — Progress Notes (Signed)
  Subjective:    Patient ID: [name], female    DOB: 1955-10-21, 55 y.o.   MRN: [id number]  HPI 55 yo woman, hx HTN, DM, never smoker. Also carries dx asthma since 45's. Was admitted to [facility] 9/12 for A Fib + RVR. Part of the eval included CT-PA that showed no PE but paratracheal node and perihiliar nodes enlarged.   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 Returns after repeat scan done 10/18/10 as below, shows continued LAD with some decrease in size in the interval. Breathing has been OK, has only needed SABA once since last time.    Review of Systems  Constitutional: Negative.  Negative for fever and unexpected weight change.  HENT: Negative.  Negative for ear pain, nosebleeds, congestion, sore throat, rhinorrhea, sneezing, trouble swallowing, dental problem, postnasal drip and sinus pressure.   Eyes: Negative.  Negative for redness and itching.  Respiratory: Positive for shortness of breath. Negative for cough, chest tightness and wheezing.   Cardiovascular: Positive for leg swelling. Negative for palpitations.  Gastrointestinal: Negative.  Negative for nausea and vomiting.  Genitourinary: Negative.  Negative for dysuria.  Musculoskeletal: Negative.  Negative for joint swelling.  Skin: Negative.  Negative for rash.  Neurological: Negative.  Negative for headaches.  Hematological: Negative.  Does not bruise/bleed easily.  Psychiatric/Behavioral: Negative.  Negative for dysphoric mood. The patient is not nervous/anxious.     CT CHEST WITH CONTRAST  10/18/10:  Technique: Multidetector CT imaging of the chest was performed  following the standard protocol during bolus administration of  intravenous contrast.  Contrast: 80mL OMNIPAQUE IOHEXOL 300 MG/ML IV SOLN  Comparison: 09/18/2010  Findings: Mediastinal and bilateral hilar adenopathy again noted.  Right paratracheal lymph node has decreased from 3.6 cm in short  axis diameter 2.6 cm.  Prevascular adenopathy previously had a  short axis diameter of 1.9 cm compared 1.2 cm currently. Right  hilar adenopathy has a short axis diameter of 1.9 cm, compared to  1.6 cm currently.  Heart is mildly enlarged. Aorta is normal caliber.  Lungs clear. No effusions.  Visualized thyroid and chest wall soft tissues unremarkable.  Imaging into the upper abdomen shows no acute findings.  No acute bony abnormality. Minimal degenerative changes in the  thoracic spine.   IMPRESSION:  Continued mediastinal and bilateral hilar adenopathy, with slight  decrease in size of the index lymph nodes     Objective:   Physical Exam  Gen: Pleasant, obese woman, in no distress,  normal affect  ENT: No lesions,  mouth clear,  oropharynx clear, no postnasal drip  Neck: No JVD, no TMG, no carotid bruits  Lungs: No use of accessory muscles, no dullness to percussion, clear without rales or rhonchi  Cardiovascular: RRR, heart sounds normal, no murmur or gallops, no peripheral edema  Musculoskeletal: No deformities, no cyanosis or clubbing  Neuro: alert, non focal  Skin: Warm, no lesions or rashes     Assessment & Plan:  Asthma - will need full PFT.  - SABA prn   Mediastinal lymphadenopathy Smaller but still present on CT scan from 10/22/10.  - set up FOB + EBUS for needle bx's

## 2010-11-08 NOTE — Assessment & Plan Note (Signed)
Smaller but still present on CT scan from 10/22/10.  - set up FOB + EBUS for needle bx's

## 2010-11-08 NOTE — Assessment & Plan Note (Signed)
-   will need full PFT.  - SABA prn

## 2010-11-15 ENCOUNTER — Encounter (HOSPITAL_COMMUNITY): Payer: Self-pay | Admitting: Pharmacy Technician

## 2010-11-16 ENCOUNTER — Encounter (HOSPITAL_COMMUNITY): Payer: Self-pay

## 2010-11-16 ENCOUNTER — Encounter (HOSPITAL_COMMUNITY)
Admission: RE | Admit: 2010-11-16 | Discharge: 2010-11-16 | Disposition: A | Discharge status: Home / Self Care | Payer: 59 | Source: Ambulatory Visit | Attending: Thoracic Surgery | Admitting: Thoracic Surgery

## 2010-11-16 LAB — CBC
MCV: 89.1 fL (ref 78.0–100.0)
Platelets: 153 10*3/uL (ref 150–400)
RDW: 13.6 % (ref 11.5–15.5)
WBC: 6.2 10*3/uL (ref 4.0–10.5)

## 2010-11-16 LAB — BASIC METABOLIC PANEL
Calcium: 9.9 mg/dL (ref 8.4–10.5)
GFR calc non Af Amer: >90 mL/min (ref >90)
Glucose, Bld: 494 mg/dL — ABNORMAL HIGH (ref 70–99)
Sodium: 137 mEq/L (ref 135–145)

## 2010-11-16 LAB — APTT: aPTT: 40 seconds — ABNORMAL HIGH (ref 24–37)

## 2010-11-16 NOTE — Pre-Procedure Instructions (Addendum)
20 JAIYANA CANALE  11/16/2010   Your procedure is scheduled on:  11/24/10 Wednesday  Report to [facility] at 06:30 AM.  Call this number if you have problems the morning of surgery: [phone]   Remember:   Do not eat food:After Midnight.  Do not drink clear liquids: 4 Hours before arrival. (2:30 AM)  Take these medicines the morning of surgery with A SIP OF WATER: albuterol, diovan, nebivolol, taztia    Do not wear jewelry, make-up or nail polish.  Do not wear lotions, powders, or perfumes. You may wear deodorant.  Do not shave 48 hours prior to surgery.  Do not bring valuables to the hospital.  Contacts, dentures or bridgework may not be worn into surgery.  Leave suitcase in the car. After surgery it may be brought to your room.  For patients admitted to the hospital, checkout time is 11:00 AM the day of discharge.   Patients discharged the day of surgery will not be allowed to drive home.  Name and phone number of your driver: depending on time of discharge son, daughter, or husband will pick up.   Special Instructions: CHG Shower Use Special Wash: 1/2 bottle night before surgery and 1/2 bottle morning of surgery.   Please read over the following fact sheets that you were given: Pain Booklet, Coughing and Deep Breathing and Surgical Site Infection Prevention, Chlorhexidine Antiseptic information.

## 2010-11-23 NOTE — Consult Note (Signed)
Anesthesia:  55 year old female for EBUS on 11/24/10.  Hx + recent onset afib (Dr. Eldridge Dace), poorly controlled DM Type II ([name] [name], PA-C, [place]), HTN, obesity, hyperlipidemia, murmur, and bronchospasm 1998.  She was recently hospitalized in early September for CP and afib with RVR and acute diastolic HF.  Work-up included a chest CT which showed mediastinal/hilar LAD.  Stress test (in Epic) on 09/21/10 showed no inducible ischemia, EF 65%, small fixed septal and lateral wall defects c/w small areas of scar.  Echo doe 09/18/10 (found in E-chart) showed EF 55-60%, mild LVH, no LV regional wall motion abnormalities, mild AR, mild-mod MR, LA severely dilated, pulmonary peak pressure 57mm HG.  EKG noted.  Per Dr. Hoyle Barr last note, she has persistent afib.  Asked to review pre-operative labs.  Glucose 494, INR 2.1, PTT 40.  I was unable to reach patient by telephone, but medications list Xarelto.  I did not see that she was on Coumadin.  She is also on Lantus.  Her AIC in September was 8.4.  She will need coags and glucose rechecked on arrival.  I reviewed results with both Dr. Michelle Piper and Dr. Delton Coombes.  Disposition for OR will depend on follow-up lab results.

## 2010-11-24 ENCOUNTER — Encounter (HOSPITAL_COMMUNITY)
Admission: RE | Disposition: A | Discharge status: Home / Self Care | Payer: Self-pay | Source: Ambulatory Visit | Attending: Emergency Medicine

## 2010-11-24 ENCOUNTER — Other Ambulatory Visit: Payer: Self-pay | Admitting: Emergency Medicine

## 2010-11-24 ENCOUNTER — Encounter (HOSPITAL_COMMUNITY): Payer: Self-pay | Admitting: Vascular Surgery

## 2010-11-24 ENCOUNTER — Ambulatory Visit (HOSPITAL_COMMUNITY)
Admission: RE | Admit: 2010-11-24 | Discharge: 2010-11-24 | Disposition: A | Discharge status: Home / Self Care | Payer: 59 | Source: Ambulatory Visit | Attending: Emergency Medicine | Admitting: Emergency Medicine

## 2010-11-24 ENCOUNTER — Ambulatory Visit (HOSPITAL_COMMUNITY): Payer: 59 | Admitting: Vascular Surgery

## 2010-11-24 ENCOUNTER — Ambulatory Visit (HOSPITAL_COMMUNITY): Payer: 59

## 2010-11-24 ENCOUNTER — Encounter (HOSPITAL_COMMUNITY): Payer: Self-pay | Admitting: *Deleted

## 2010-11-24 ENCOUNTER — Encounter (HOSPITAL_COMMUNITY): Payer: Self-pay | Admitting: Anesthesiology

## 2010-11-24 DIAGNOSIS — R599 Enlarged lymph nodes, unspecified: Secondary | ICD-10-CM | POA: Insufficient documentation

## 2010-11-24 DIAGNOSIS — J45909 Unspecified asthma, uncomplicated: Secondary | ICD-10-CM | POA: Insufficient documentation

## 2010-11-24 DIAGNOSIS — I10 Essential (primary) hypertension: Secondary | ICD-10-CM | POA: Insufficient documentation

## 2010-11-24 DIAGNOSIS — E119 Type 2 diabetes mellitus without complications: Secondary | ICD-10-CM | POA: Insufficient documentation

## 2010-11-24 LAB — CBC
HCT: 43.7 % (ref 36.0–46.0)
Hemoglobin: 14.4 g/dL (ref 12.0–15.0)
RDW: 13.8 % (ref 11.5–15.5)
WBC: 6.5 10*3/uL (ref 4.0–10.5)

## 2010-11-24 LAB — COMPREHENSIVE METABOLIC PANEL
Alkaline Phosphatase: 113 U/L (ref 39–117)
BUN: 13 mg/dL (ref 6–23)
CO2: 26 mEq/L (ref 19–32)
Calcium: 9.9 mg/dL (ref 8.4–10.5)
GFR calc Af Amer: >90 mL/min (ref >90)
GFR calc non Af Amer: >90 mL/min (ref >90)
Glucose, Bld: 308 mg/dL — ABNORMAL HIGH (ref 70–99)
Total Protein: 7.4 g/dL (ref 6.0–8.3)

## 2010-11-24 LAB — PROTIME-INR
INR: 1.15 (ref 0.00–1.49)
Prothrombin Time: 14.9 seconds (ref 11.6–15.2)

## 2010-11-24 LAB — APTT: aPTT: 34 seconds (ref 24–37)

## 2010-11-24 LAB — GLUCOSE, CAPILLARY: Glucose-Capillary: 245 mg/dL — ABNORMAL HIGH (ref 70–99)

## 2010-11-24 SURGERY — BRONCHOSCOPY, WITH EBUS
Anesthesia: General | Wound class: Clean Contaminated

## 2010-11-24 MED ORDER — LACTATED RINGERS IV SOLN
INTRAVENOUS | Status: DC | PRN
  Administered 2010-11-24 (×2): via INTRAVENOUS

## 2010-11-24 MED ORDER — PHENYLEPHRINE HCL 10 MG/ML IJ SOLN
INTRAMUSCULAR | Status: DC | PRN
  Administered 2010-11-24: 80 ug via INTRAVENOUS

## 2010-11-24 MED ORDER — PROPOFOL 10 MG/ML IV EMUL
INTRAVENOUS | Status: DC | PRN
  Administered 2010-11-24: 150 mg via INTRAVENOUS

## 2010-11-24 MED ORDER — LACTATED RINGERS IV SOLN: INTRAVENOUS | Status: DC

## 2010-11-24 MED ORDER — NEOSTIGMINE METHYLSULFATE 1 MG/ML IJ SOLN
INTRAMUSCULAR | Status: DC | PRN
  Administered 2010-11-24: 5 mg via INTRAVENOUS

## 2010-11-24 MED ORDER — HYDROMORPHONE HCL PF 1 MG/ML IJ SOLN: 0.2500 mg | INTRAMUSCULAR | Status: DC | PRN

## 2010-11-24 MED ORDER — MORPHINE SULFATE 2 MG/ML IJ SOLN: 0.0500 mg/kg | INTRAMUSCULAR | Status: DC | PRN

## 2010-11-24 MED ORDER — FENTANYL CITRATE 0.05 MG/ML IJ SOLN
INTRAMUSCULAR | Status: DC | PRN
  Administered 2010-11-24: 100 ug via INTRAVENOUS

## 2010-11-24 MED ORDER — GLYCOPYRROLATE 0.2 MG/ML IJ SOLN
INTRAMUSCULAR | Status: DC | PRN
  Administered 2010-11-24: 1 mg via INTRAVENOUS

## 2010-11-24 MED ORDER — ONDANSETRON HCL 4 MG/2ML IJ SOLN
INTRAMUSCULAR | Status: DC | PRN
  Administered 2010-11-24: 4 mg via INTRAVENOUS

## 2010-11-24 MED ORDER — PROMETHAZINE HCL 25 MG/ML IJ SOLN: 6.2500 mg | INTRAMUSCULAR | Status: DC | PRN

## 2010-11-24 MED ORDER — VECURONIUM BROMIDE 10 MG IV SOLR
INTRAVENOUS | Status: DC | PRN
  Administered 2010-11-24: 6 mg via INTRAVENOUS

## 2010-11-24 MED ORDER — SODIUM CHLORIDE 0.9 % IR SOLN
Status: DC | PRN
  Administered 2010-11-24: 1000 mL

## 2010-11-24 MED ORDER — MIDAZOLAM HCL 5 MG/5ML IJ SOLN
INTRAMUSCULAR | Status: DC | PRN
  Administered 2010-11-24: 1 mg via INTRAVENOUS

## 2010-11-24 SURGICAL SUPPLY — 23 items
BRUSH CYTOL CELLEBRITY 1.5X140 (MISCELLANEOUS) IMPLANT
CANISTER SUCTION 2500CC (MISCELLANEOUS) ×2 IMPLANT
CLOTH BEACON ORANGE TIMEOUT ST (SAFETY) ×2 IMPLANT
CONT SPEC 4OZ CLIKSEAL STRL BL (MISCELLANEOUS) ×2 IMPLANT
COVER TABLE BACK 60X90 (DRAPES) ×2 IMPLANT
FORCEPS BIOP RJ4 1.8 (CUTTING FORCEPS) IMPLANT
GLOVE SKINSENSE STRL SZ6.0 (GLOVE) ×1 IMPLANT
GLOVE SURG SIGNA 7.5 PF LTX (GLOVE) ×2 IMPLANT
KIT ROOM TURNOVER OR (KITS) ×2 IMPLANT
MARKER SKIN DUAL TIP RULER LAB (MISCELLANEOUS) ×2 IMPLANT
NDL BIOPSY TRANSBRONCH 21G (NEEDLE) IMPLANT
NEEDLE BIOPSY TRANSBRONCH 21G (NEEDLE) IMPLANT
NEEDLE SYS SONOTIP II EBUSTBNA (NEEDLE) ×1 IMPLANT
NS IRRIG 1000ML POUR BTL (IV SOLUTION) ×2 IMPLANT
OIL SILICONE PENTAX (PARTS (SERVICE/REPAIRS)) ×1 IMPLANT
PAD ARMBOARD 7.5X6 YLW CONV (MISCELLANEOUS) ×2 IMPLANT
SPONGE GAUZE 4X4 12PLY (GAUZE/BANDAGES/DRESSINGS) ×1 IMPLANT
SYR 20CC LL (SYRINGE) ×2 IMPLANT
SYR 20ML ECCENTRIC (SYRINGE) ×5 IMPLANT
SYR 5ML LUER SLIP (SYRINGE) ×2 IMPLANT
TOWEL OR 17X24 6PK STRL BLUE (TOWEL DISPOSABLE) ×2 IMPLANT
TRAP SPECIMEN MUCOUS 40CC (MISCELLANEOUS) ×3 IMPLANT
TUBE CONNECTING 12X1/4 (SUCTIONS) ×2 IMPLANT

## 2010-11-24 NOTE — Anesthesia Procedure Notes (Addendum)
Performed by: Bronson Ing F   Procedure Name: Intubation Date/Time: 11/24/2010 8:50 AM Performed by: Carmela Rima Pre-anesthesia Checklist: Patient identified, Timeout performed, Emergency Drugs available, Suction available and Patient being monitored Patient Re-evaluated:Patient Re-evaluated prior to inductionOxygen Delivery Method: Circle System Utilized Preoxygenation: Pre-oxygenation with 100% oxygen Intubation Type: IV induction Ventilation: Mask ventilation without difficulty Laryngoscope Size: Mac and 3 Grade View: Grade I Tube type: Oral Tube size: 8.5 mm Number of attempts: 1 Placement Confirmation: ETT inserted through vocal cords under direct vision,  breath sounds checked- equal and bilateral,  positive ETCO2 and CO2 detector Secured at: 22 cm Tube secured with: Tape Dental Injury: Teeth and Oropharynx as per pre-operative assessment

## 2010-11-24 NOTE — Op Note (Signed)
Video Bronchoscopy with Endobronchial Ultrasound Procedure Note  Date of Operation: 11/24/2010   Pre-op Diagnosis: peritracheal and hilar adenopathy  Post-op Diagnosis: same  Surgeon: Levy Pupa  Assistants: none  Anesthesia: General endotracheal anesthesia  Operation: Flexible video fiberoptic bronchoscopy with endobronchial ultrasound and biopsies.  Estimated Blood Loss: 10cc  Complications: none  Indications and History: [name] is a 55 y.o. female with HIlar and mediastinal LAD noted on CT scan of the chest that failed to resolve on serial scans. We decided to pursue [name] needle bx's via FOB + EBUS.  The risks, benefits, complications, treatment options and expected outcomes were discussed with the patient.  The possibilities of pneumothorax, pneumonia, reaction to medication, pulmonary aspiration, perforation of a viscus, bleeding, failure to diagnose a condition and creating a complication requiring transfusion or operation were discussed with the patient who freely signed the consent.    Description of Procedure: The patient was examined in the preoperative area and history and data from the preprocedure consultation were reviewed. It was deemed appropriate to proceed.  The patient was taken to the OR, identified as [name] and the procedure verified as Flexible Video Fiberoptic Bronchoscopy.  A Time Out was held and the above information confirmed. After being taken to the operating room general anesthesia was initiated and the patient  was orally intubated. The video fiberoptic bronchoscope was introduced via the endotracheal tube and a general inspection was performed which showed some scant white mucous but normal airways. The standard scope was then withdrawn and the endobronchial ultrasound was used to identify and characterize the peritracheal, hilar lymph nodes. Inspection showed enlarged nodes at stations 11R, 10R and 4R. The station 7 node was normal in size.  Using real-time ultrasound guidance [name] needle biopsies were take from Station 11R, 10R, 4R, 7 nodes and were sent for cytology. The patient tolerated the procedure well without apparent complications. There was no significant blood loss. The bronchoscope was withdrawn. Anesthesia was reversed and the patient was taken to the PACU for recovery.   Samples: 1. [name] needle biopsies from 11R node 2. [name] needle biopsies from 10R node 3. [name] needle biopsies from 4R node 4. [name] needle biopsies from 7 node 5. RUL quantitative BAL (22cc of 60cc returned)  Plans:  The patient will be discharged from the PACU to home when recovered from anesthesia. We will review the cytology, pathology and microbiology results with the patient when they become available. Outpatient followup will be with Dr Delton Coombes.    [name] S. 11/24/2010 9:43 AM

## 2010-11-24 NOTE — Anesthesia Preprocedure Evaluation (Addendum)
Anesthesia Evaluation  Patient identified by MRN, date of birth, ID band Patient awake    Reviewed: Allergy & Precautions, H&P , NPO status , Patient's Chart, lab work & pertinent test results  Airway Mallampati: II TM Distance: >3 FB Neck ROM: Full    Dental  (+) Teeth Intact   Pulmonary asthma ,  clear to auscultation  Pulmonary exam normal       Cardiovascular hypertension, +CHF + Valvular Problems/Murmurs Regular Normal '07 Cath:  Normal coronaries, normal LVF, mild RA stenosis   Neuro/Psych    GI/Hepatic hiatal hernia, GERD-  Controlled,  Endo/Other  Poorly Controlled, Type 1, Insulin Dependent  Renal/GU      Musculoskeletal   Abdominal (+) obese,   Peds  Hematology   Anesthesia Other Findings   Reproductive/Obstetrics                       Anesthesia Physical Anesthesia Plan  ASA: III  Anesthesia Plan: General   Post-op Pain Management:    Induction: Intravenous  Airway Management Planned: Oral ETT  Additional Equipment:   Intra-op Plan:   Post-operative Plan: Extubation in OR  Informed Consent: I have reviewed the patients History and Physical, chart, labs and discussed the procedure including the risks, benefits and alternatives for the proposed anesthesia with the patient or authorized representative who has indicated his/her understanding and acceptance.   Dental advisory given  Plan Discussed with:   Anesthesia Plan Comments: (Plan routine monitors, GETA)        Anesthesia Quick Evaluation

## 2010-11-24 NOTE — Anesthesia Postprocedure Evaluation (Signed)
  Anesthesia Post-op Note  Patient: [name]  Procedure(s) Performed:  VIDEO BRONCHOSCOPY WITH ENDOBRONCHIAL ULTRASOUND - EBUS  Patient Location: PACU  Anesthesia Type: General  Level of Consciousness: awake and alert   Airway and Oxygen Therapy: Patient Spontanous Breathing and Patient connected to nasal cannula oxygen  Post-op Pain: none  Post-op Assessment: Post-op Vital signs reviewed, Patient's Cardiovascular Status Stable, Respiratory Function Stable, Patent Airway, No signs of Nausea or vomiting and Adequate PO intake  Post-op Vital Signs: Reviewed and stable  Complications: No apparent anesthesia complications

## 2010-11-24 NOTE — Consult Note (Signed)
Updated H&P  Please refer to the attached Consult note from 11/08/10.  Ms [name] has peritracheal and perihilar nodes that are persistent on CT scan. We have elected to perform EBUS and needle bx.  Her exam is unchanged, labs are acceptable to proceed. No barriers to procedure identified.  [name] 11/24/2010 8:25 AM     Subjective:   Patient ID: [name], female DOB: 1955/07/20, 55 y.o. MRN: [id number]  HPI  55 yo woman, hx HTN, DM, never smoker. Also carries dx asthma since 21's. Was admitted to [facility] 9/12 for A Fib + RVR. Part of the eval included CT-PA that showed no PE but paratracheal node and perihiliar nodes enlarged.  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 Returns after repeat scan done 10/18/10 as below, shows continued LAD with some decrease in size in the interval. Breathing has been OK, has only needed SABA once since last time.  Review of Systems  Constitutional: Negative. Negative for fever and unexpected weight change.  HENT: Negative. Negative for ear pain, nosebleeds, congestion, sore throat, rhinorrhea, sneezing, trouble swallowing, dental problem, postnasal drip and sinus pressure.  Eyes: Negative. Negative for redness and itching.  Respiratory: Positive for shortness of breath. Negative for cough, chest tightness and wheezing.  Cardiovascular: Positive for leg swelling. Negative for palpitations.  Gastrointestinal: Negative. Negative for nausea and vomiting.  Genitourinary: Negative. Negative for dysuria.  Musculoskeletal: Negative. Negative for joint swelling.  Skin: Negative. Negative for rash.  Neurological: Negative. Negative for headaches.  Hematological: Negative. Does not bruise/bleed easily.  Psychiatric/Behavioral: Negative. Negative for dysphoric mood. The patient is not nervous/anxious.  CT CHEST WITH CONTRAST 10/18/10:  Technique: Multidetector CT imaging of the chest was performed  following the standard  protocol during bolus administration of  intravenous contrast.  Contrast: 80mL OMNIPAQUE IOHEXOL 300 MG/ML IV SOLN  Comparison: 09/18/2010  Findings: Mediastinal and bilateral hilar adenopathy again noted.  Right paratracheal lymph node has decreased from 3.6 cm in short  axis diameter 2.6 cm. Prevascular adenopathy previously had a  short axis diameter of 1.9 cm compared 1.2 cm currently. Right  hilar adenopathy has a short axis diameter of 1.9 cm, compared to  1.6 cm currently.  Heart is mildly enlarged. Aorta is normal caliber.  Lungs clear. No effusions.  Visualized thyroid and chest wall soft tissues unremarkable.  Imaging into the upper abdomen shows no acute findings.  No acute bony abnormality. Minimal degenerative changes in the  thoracic spine.  IMPRESSION:  Continued mediastinal and bilateral hilar adenopathy, with slight  decrease in size of the index lymph nodes  Objective:   Physical Exam  Gen: Pleasant, obese woman, in no distress, normal affect  ENT: No lesions, mouth clear, oropharynx clear, no postnasal drip  Neck: No JVD, no TMG, no carotid bruits  Lungs: No use of accessory muscles, no dullness to percussion, clear without rales or rhonchi  Cardiovascular: RRR, heart sounds normal, no murmur or gallops, no peripheral edema  Musculoskeletal: No deformities, no cyanosis or clubbing  Neuro: alert, non focal  Skin: Warm, no lesions or rashes  Assessment & Plan:   Asthma  - will need full PFT.  - SABA prn  Mediastinal lymphadenopathy  Smaller but still present on CT scan from 10/22/10.  - set up FOB + EBUS for needle bx's    Asthma - [name], MD 11/08/2010 2:00 PM Signed  - will need full PFT.  - SABA prn  Mediastinal lymphadenopathy - [name], MD 11/08/2010 2:06 PM Signed  Smaller but still present on CT scan from 10/22/10.  - set up FOB + EBUS for needle bx's

## 2010-11-24 NOTE — Transfer of Care (Signed)
Immediate Anesthesia Transfer of Care Note  Patient: [name]  Procedure(s) Performed:  VIDEO BRONCHOSCOPY WITH ENDOBRONCHIAL ULTRASOUND - EBUS  Patient Location: PACU  Anesthesia Type: General  Level of Consciousness: awake, sedated and lethargic  Airway & Oxygen Therapy: Patient Spontanous Breathing and Patient connected to face mask oxygen  Post-op Assessment: Report given to PACU RN, Post -op Vital signs reviewed and stable and Patient moving all extremities  Post vital signs: Reviewed and stable  Complications: No apparent anesthesia complications

## 2010-11-26 LAB — CULTURE, RESPIRATORY W GRAM STAIN

## 2010-11-29 ENCOUNTER — Telehealth: Payer: Self-pay | Admitting: Emergency Medicine

## 2010-11-29 NOTE — Telephone Encounter (Signed)
Attempted to call pt regarding [name] needle bx's done last week. No answer on either line. The biopsies all show benign nodal tissue. I will attempt to call her back.

## 2010-12-01 NOTE — Telephone Encounter (Signed)
Spoke with patient today and explained that bx's are negative. We will meet in Dec to plan next steps for f/u.  [name] S.

## 2010-12-01 NOTE — Telephone Encounter (Signed)
Called pt's daughter Anell Barr to get the right phone # for pt = [phone]. Reviewd bx results w her - all bx's negative for malignancy. No answer - LMOM.

## 2010-12-13 ENCOUNTER — Encounter: Payer: 59 | Admitting: Emergency Medicine

## 2010-12-13 ENCOUNTER — Encounter: Payer: Self-pay | Admitting: Emergency Medicine

## 2010-12-24 LAB — FUNGUS CULTURE W SMEAR: Fungal Smear: NONE SEEN

## 2011-01-05 ENCOUNTER — Inpatient Hospital Stay (HOSPITAL_COMMUNITY)
Admission: EM | Admit: 2011-01-05 | Discharge: 2011-01-07 | DRG: 293 | Disposition: A | Discharge status: Home / Self Care | Payer: 59 | Attending: Interventional Cardiology | Admitting: Interventional Cardiology

## 2011-01-05 ENCOUNTER — Encounter (HOSPITAL_COMMUNITY): Payer: Self-pay | Admitting: Emergency Medicine

## 2011-01-05 ENCOUNTER — Other Ambulatory Visit: Payer: Self-pay

## 2011-01-05 ENCOUNTER — Emergency Department (HOSPITAL_COMMUNITY): Payer: 59

## 2011-01-05 DIAGNOSIS — R0902 Hypoxemia: Secondary | ICD-10-CM | POA: Diagnosis present

## 2011-01-05 DIAGNOSIS — Z794 Long term (current) use of insulin: Secondary | ICD-10-CM

## 2011-01-05 DIAGNOSIS — I509 Heart failure, unspecified: Secondary | ICD-10-CM | POA: Diagnosis present

## 2011-01-05 DIAGNOSIS — J811 Chronic pulmonary edema: Secondary | ICD-10-CM

## 2011-01-05 DIAGNOSIS — E119 Type 2 diabetes mellitus without complications: Secondary | ICD-10-CM | POA: Diagnosis present

## 2011-01-05 DIAGNOSIS — J45909 Unspecified asthma, uncomplicated: Secondary | ICD-10-CM | POA: Diagnosis present

## 2011-01-05 DIAGNOSIS — R0602 Shortness of breath: Secondary | ICD-10-CM | POA: Diagnosis present

## 2011-01-05 DIAGNOSIS — E7800 Pure hypercholesterolemia, unspecified: Secondary | ICD-10-CM | POA: Diagnosis present

## 2011-01-05 DIAGNOSIS — I4891 Unspecified atrial fibrillation: Secondary | ICD-10-CM

## 2011-01-05 DIAGNOSIS — I10 Essential (primary) hypertension: Secondary | ICD-10-CM | POA: Diagnosis present

## 2011-01-05 DIAGNOSIS — Z6836 Body mass index (BMI) 36.0-36.9, adult: Secondary | ICD-10-CM

## 2011-01-05 DIAGNOSIS — I519 Heart disease, unspecified: Secondary | ICD-10-CM | POA: Diagnosis present

## 2011-01-05 DIAGNOSIS — E669 Obesity, unspecified: Secondary | ICD-10-CM | POA: Diagnosis present

## 2011-01-05 DIAGNOSIS — I5023 Acute on chronic systolic (congestive) heart failure: Principal | ICD-10-CM | POA: Diagnosis present

## 2011-01-05 DIAGNOSIS — Z7982 Long term (current) use of aspirin: Secondary | ICD-10-CM

## 2011-01-05 HISTORY — DX: Unspecified atrial fibrillation: I48.91

## 2011-01-05 LAB — GLUCOSE, CAPILLARY: Glucose-Capillary: 231 mg/dL — ABNORMAL HIGH (ref 70–99)

## 2011-01-05 LAB — CBC
HCT: 41.1 % (ref 36.0–46.0)
Hemoglobin: 13.4 g/dL (ref 12.0–15.0)
MCH: 30.9 pg (ref 26.0–34.0)
MCHC: 32.6 g/dL (ref 30.0–36.0)
MCV: 94.7 fL (ref 78.0–100.0)

## 2011-01-05 LAB — COMPREHENSIVE METABOLIC PANEL
Albumin: 3.3 g/dL — ABNORMAL LOW (ref 3.5–5.2)
Albumin: 3.2 g/dL — ABNORMAL LOW (ref 3.5–5.2)
BUN: 13 mg/dL (ref 6–23)
BUN: 12 mg/dL (ref 6–23)
Creatinine, Ser: 0.72 mg/dL (ref 0.50–1.10)
Creatinine, Ser: 0.71 mg/dL (ref 0.50–1.10)
GFR calc Af Amer: >90 mL/min (ref >90)
Glucose, Bld: 302 mg/dL — ABNORMAL HIGH (ref 70–99)
Total Bilirubin: 0.5 mg/dL (ref 0.3–1.2)
Total Bilirubin: 0.7 mg/dL (ref 0.3–1.2)
Total Protein: 7.6 g/dL (ref 6.0–8.3)
Total Protein: 7.2 g/dL (ref 6.0–8.3)

## 2011-01-05 LAB — DIFFERENTIAL
Basophils Relative: 0 % (ref 0–1)
Eosinophils Absolute: 0.1 10*3/uL (ref 0.0–0.7)
Monocytes Absolute: 0.4 10*3/uL (ref 0.1–1.0)
Monocytes Relative: 6 % (ref 3–12)

## 2011-01-05 LAB — PROTIME-INR
INR: 1.27 (ref 0.00–1.49)
Prothrombin Time: 16.2 seconds — ABNORMAL HIGH (ref 11.6–15.2)

## 2011-01-05 LAB — MRSA PCR SCREENING: MRSA by PCR: NEGATIVE

## 2011-01-05 LAB — PRO B NATRIURETIC PEPTIDE: Pro B Natriuretic peptide (BNP): 656.3 pg/mL — ABNORMAL HIGH (ref 0–125)

## 2011-01-05 LAB — CARDIAC PANEL(CRET KIN+CKTOT+MB+TROPI)
CK, MB: 5.2 ng/mL — ABNORMAL HIGH (ref 0.3–4.0)
Total CK: 188 U/L — ABNORMAL HIGH (ref 7–177)

## 2011-01-05 MED ORDER — VITAMIN D3 25 MCG (1000 UNIT) PO TABS
1000.0000 [IU] | ORAL_TABLET | Freq: Every day | ORAL | Status: DC
  Filled 2011-01-05: qty 1

## 2011-01-05 MED ORDER — ASPIRIN 81 MG PO CHEW
81.0000 mg | CHEWABLE_TABLET | Freq: Every day | ORAL | Status: DC
  Filled 2011-01-05: qty 1

## 2011-01-05 MED ORDER — SODIUM CHLORIDE 0.9 % IV SOLN
250.0000 mL | INTRAVENOUS | Status: DC | PRN
  Administered 2011-01-05: 10 mL via INTRAVENOUS

## 2011-01-05 MED ORDER — CALCIUM CARBONATE 1250 (500 CA) MG PO TABS
1.0000 | ORAL_TABLET | Freq: Every day | ORAL | Status: DC
  Administered 2011-01-05 – 2011-01-07 (×3): 500 mg via ORAL
  Filled 2011-01-05 (×3): qty 1

## 2011-01-05 MED ORDER — RIVAROXABAN 20 MG PO TABS: 1.0000 | ORAL_TABLET | Freq: Every day | ORAL | Status: DC

## 2011-01-05 MED ORDER — RAMIPRIL 10 MG PO CAPS
10.0000 mg | ORAL_CAPSULE | Freq: Every day | ORAL | Status: DC
  Administered 2011-01-05: 10 mg via ORAL
  Filled 2011-01-05 (×2): qty 1

## 2011-01-05 MED ORDER — VITAMIN D 1000 UNITS PO CAPS
1000.0000 [IU] | ORAL_CAPSULE | Freq: Every day | ORAL | Status: DC
Start: 2011-01-05 — End: 2011-01-05

## 2011-01-05 MED ORDER — POTASSIUM CHLORIDE CRYS ER 10 MEQ PO TBCR
10.0000 meq | EXTENDED_RELEASE_TABLET | Freq: Every day | ORAL | Status: DC
  Administered 2011-01-05 – 2011-01-06 (×2): 10 meq via ORAL
  Filled 2011-01-05 (×3): qty 1

## 2011-01-05 MED ORDER — INSULIN GLARGINE 100 UNIT/ML ~~LOC~~ SOLN: 30.0000 [IU] | SUBCUTANEOUS | Status: DC

## 2011-01-05 MED ORDER — ONDANSETRON HCL 4 MG/2ML IJ SOLN: 4.0000 mg | Freq: Four times a day (QID) | INTRAMUSCULAR | Status: DC | PRN

## 2011-01-05 MED ORDER — INSULIN GLARGINE 100 UNIT/ML ~~LOC~~ SOLN
35.0000 [IU] | Freq: Every day | SUBCUTANEOUS | Status: DC
  Administered 2011-01-05 – 2011-01-06 (×2): 35 [IU] via SUBCUTANEOUS
  Filled 2011-01-05 (×5): qty 0.35

## 2011-01-05 MED ORDER — ASPIRIN 81 MG PO CHEW
81.0000 mg | CHEWABLE_TABLET | Freq: Every day | ORAL | Status: DC
  Administered 2011-01-05 – 2011-01-07 (×3): 81 mg via ORAL
  Filled 2011-01-05 (×2): qty 1

## 2011-01-05 MED ORDER — SODIUM CHLORIDE 0.9 % IJ SOLN
3.0000 mL | Freq: Two times a day (BID) | INTRAMUSCULAR | Status: DC
  Administered 2011-01-05 – 2011-01-06 (×2): 3 mL via INTRAVENOUS
  Administered 2011-01-06: 10:00:00 via INTRAVENOUS

## 2011-01-05 MED ORDER — NEBIVOLOL HCL 5 MG PO TABS
5.0000 mg | ORAL_TABLET | Freq: Every day | ORAL | Status: DC
Start: 2011-01-05 — End: 2011-01-07
  Administered 2011-01-05 – 2011-01-07 (×3): 5 mg via ORAL
  Filled 2011-01-05 (×4): qty 1

## 2011-01-05 MED ORDER — DILTIAZEM LOAD VIA INFUSION
10.0000 mg | Freq: Once | INTRAVENOUS | Status: AC
  Administered 2011-01-05: 10 mg via INTRAVENOUS
  Filled 2011-01-05: qty 10

## 2011-01-05 MED ORDER — OLMESARTAN MEDOXOMIL 40 MG PO TABS
40.0000 mg | ORAL_TABLET | Freq: Every day | ORAL | Status: DC
  Administered 2011-01-05 – 2011-01-07 (×3): 40 mg via ORAL
  Filled 2011-01-05 (×3): qty 1

## 2011-01-05 MED ORDER — INSULIN GLARGINE 100 UNIT/ML ~~LOC~~ SOLN
30.0000 [IU] | Freq: Every day | SUBCUTANEOUS | Status: DC
  Administered 2011-01-06: 30 [IU] via SUBCUTANEOUS
  Filled 2011-01-05 (×4): qty 0.3

## 2011-01-05 MED ORDER — ASPIRIN 81 MG PO TABS: 81.0000 mg | ORAL_TABLET | Freq: Every day | ORAL | Status: DC

## 2011-01-05 MED ORDER — RIVAROXABAN 10 MG PO TABS
20.0000 mg | ORAL_TABLET | Freq: Every day | ORAL | Status: DC
  Administered 2011-01-05 – 2011-01-06 (×2): 20 mg via ORAL
  Filled 2011-01-05 (×4): qty 2

## 2011-01-05 MED ORDER — NITROGLYCERIN IN D5W 200-5 MCG/ML-% IV SOLN
5.0000 ug/min | INTRAVENOUS | Status: DC
  Administered 2011-01-05: 5 ug/min via INTRAVENOUS

## 2011-01-05 MED ORDER — VITAMIN D3 25 MCG (1000 UNIT) PO TABS
1000.0000 [IU] | ORAL_TABLET | Freq: Every day | ORAL | Status: DC
  Administered 2011-01-05 – 2011-01-07 (×3): 1000 [IU] via ORAL
  Filled 2011-01-05 (×3): qty 1

## 2011-01-05 MED ORDER — ACETAMINOPHEN 325 MG PO TABS: 650.0000 mg | ORAL_TABLET | ORAL | Status: DC | PRN

## 2011-01-05 MED ORDER — FUROSEMIDE 10 MG/ML IJ SOLN
40.0000 mg | Freq: Two times a day (BID) | INTRAMUSCULAR | Status: DC
  Administered 2011-01-05 – 2011-01-06 (×2): 40 mg via INTRAVENOUS
  Filled 2011-01-05 (×4): qty 4

## 2011-01-05 MED ORDER — SODIUM CHLORIDE 0.9 % IJ SOLN: 3.0000 mL | INTRAMUSCULAR | Status: DC | PRN

## 2011-01-05 MED ORDER — ROSUVASTATIN CALCIUM 20 MG PO TABS
20.0000 mg | ORAL_TABLET | Freq: Every day | ORAL | Status: DC
  Administered 2011-01-05 – 2011-01-07 (×3): 20 mg via ORAL
  Filled 2011-01-05 (×3): qty 1

## 2011-01-05 MED ORDER — DILTIAZEM HCL 100 MG IV SOLR
5.0000 mg/h | INTRAVENOUS | Status: DC
  Administered 2011-01-05: 5 mg/h via INTRAVENOUS
  Filled 2011-01-05: qty 100

## 2011-01-05 NOTE — ED Notes (Signed)
X-ray called for portable chest and they said that they are on their way

## 2011-01-05 NOTE — ED Notes (Signed)
Started getting sob today,increasingly worse.history of aifb here on cpap. rr 32, bil upper and lower crackles.

## 2011-01-05 NOTE — ED Provider Notes (Signed)
History     CSN: [id number]  Arrival date & time 01/05/11  1124   None     Chief Complaint  Patient presents with  . Shortness of Breath    (Consider location/radiation/quality/duration/timing/severity/associated sxs/prior treatment) HPI Comments: Patient was recently admitted for afib.  This morning was walking from work to the car, then became acutely short of breath.  She is significant respiratory distress when ems arrived, and was eventually placed on bipap.  She denies cough or fever.  No chest pain.  Patient is a 55 y.o. female presenting with shortness of breath.  Shortness of Breath  The current episode started today. The problem occurs occasionally. The problem has been rapidly worsening. The problem is severe. The symptoms are relieved by nothing. The symptoms are aggravated by nothing. Associated symptoms include shortness of breath.    Past Medical History  Diagnosis Date  . Diabetes mellitus   . Asthma   . Hypertension   . Obesity   . Bronchospasm   . Carotid bruit   . Hypercholesterolemia   . Polyp of rectum   . Diabetes mellitus   . CHF (congestive heart failure)   . Abnormal chest CT   . Atrial fibrillation   . Pyuria   . Heart murmur     Past Surgical History  Procedure Date  . Cardiovascular stress test 09/2010  . Cardiac catheterization >5 years    Family History  Problem Relation Age of Onset  . Asthma Mother     History  Substance Use Topics  . Smoking status: Never Smoker   . Smokeless tobacco: Never Used  . Alcohol Use: No    OB History    Grav Para Term Preterm Abortions TAB SAB Ect Mult Living                  Review of Systems  Respiratory: Positive for shortness of breath.   All other systems reviewed and are negative.    Allergies  Review of patient's allergies indicates no known allergies.  Home Medications   Current Outpatient Rx  Name Route Sig Dispense Refill  . ALBUTEROL SULFATE HFA 108 (90 BASE) MCG/ACT  IN AERS Inhalation Inhale 2 puffs into the lungs every 6 (six) hours as needed. For shortness of breath    . ASPIRIN 81 MG PO TABS Oral Take 81 mg by mouth daily.     [name] CALCIUM CARBONATE 1500 MG PO TABS Oral Take 1 tablet by mouth daily.     [name] VITAMIN D 1000 UNITS PO CAPS Oral Take 1,000 Units by mouth daily.     [name] DIOVAN 320 MG PO TABS Oral Take 320 mg by mouth daily. Once daily    . FUROSEMIDE 20 MG PO TABS Oral Take 20 mg by mouth daily.     . INSULIN GLARGINE 100 UNIT/ML Scribner SOLN Subcutaneous Inject 35 Units into the skin at bedtime. 30 units daily. Increase by 5 units every 3 days until FBS is 120    . LIPITOR 40 MG PO TABS Oral Take 40 mg by mouth daily. Once daily    . NEBIVOLOL HCL 5 MG PO TABS Oral Take 5 mg by mouth daily.     [name] FISH OIL 1000 MG PO CAPS Oral Take 1 capsule by mouth daily.     [name] POTASSIUM CHLORIDE 10 MEQ PO TBCR Oral Take 10 mEq by mouth daily.     [name] RAMIPRIL 10 MG PO TABS Oral Take 10 mg by  mouth daily.     [name] RIVAROXABAN 20 MG PO TABS Oral Take 1 tablet by mouth daily.     [name] TAZTIA XT 180 MG PO CP24 Oral Take 180 mg by mouth Daily.      BP 147/101  Pulse 120  Resp 24  SpO2 100%  Physical Exam  Nursing note and vitals reviewed. Constitutional: She appears well-developed and well-nourished.       Appears anxious.  Neck: Normal range of motion. Neck supple.  Cardiovascular: Normal rate and regular rhythm.   No murmur heard. Pulmonary/Chest:       There are rales in the bases bilaterally.    Abdominal: Soft. Bowel sounds are normal. She exhibits no distension. There is no tenderness.  Musculoskeletal: Normal range of motion. She exhibits edema.       Edema is 2+  Lymphadenopathy:    She has no cervical adenopathy.  Skin: Skin is warm and dry.    ED Course  Procedures (including critical care time)   Labs Reviewed  CBC  DIFFERENTIAL  COMPREHENSIVE METABOLIC PANEL  CARDIAC PANEL(CRET KIN+CKTOT+MB+TROPI)  PRO B NATRIURETIC PEPTIDE   No results  found.   No diagnosis found.   Date: 01/05/2011  Rate: 114  Rhythm: Atrial Fibrillation  QRS Axis: normal  Intervals: normal  ST/T Wave abnormalities: normal  Conduction Disutrbances:none  Narrative Interpretation:   Old EKG Reviewed: unchanged    MDM  The patient arrived to the ED on Bipap which was removed and placed on 3 LNC.  This did not maintain her oxygen sats and she became dyspneic and hypoxic.  Workup shows what appears to be pulmonary edema.  I have consulted Dr. Mayford Knife from Cardiology who will come to see the patient.          Geoffery Lyons, MD 01/05/11 [phone]

## 2011-01-05 NOTE — H&P (Signed)
Admit date: 01/05/2011 Primary Cardiologist Dr. Eldridge Dace Chief complaint/reason for admission:Acute SOB, CHF, atrial fibrillation  HPI: This is a 55yo african [place] female with history of normal coronary arteries byt cath 2007, DM, HTN, CHF and atrial fibrillation who presented to the ER with complaints of SOB.  She says she has felt her heart beating irregular since her admit in September for afib and has also had some mild SOB but today after work she had severe SOB.  She denied any chest pain.  She called EMS and on their arrival she was hypoxic and they applied BiPAP.  In ER chest xray showed acute pulmonary edema.  We are now asked to admit.      PMH:    Past Medical History  Diagnosis Date  . Diabetes mellitus   . Asthma   . Hypertension   . Obesity   . Bronchospasm   . Carotid bruit   . Hypercholesterolemia   . Polyp of rectum   . Diabetes mellitus   . CHF (congestive heart failure) - Diastolic   . Abnormal chest CT   . Atrial fibrillation   . Pyuria   . Heart murmur Normal coronary arteries by cath 2007     PSH:    Past Surgical History  Procedure Date  . Cardiovascular stress test 09/2010  . Cardiac catheterization >5 years    ALLERGIES:   Review of patient's allergies indicates no known allergies.  Prior to Admit Meds:   (Not in a hospital admission) Family HX:    Family History  Problem Relation Age of Onset  . Asthma Mother    Social HX:    History   Social History  . Marital Status: Married    Spouse Name: N/A    Number of Children: N/A  . 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 Smoking status: Never Smoker   . Smokeless tobacco: Never Used  . Alcohol Use: No  . Drug Use: No  . Sexually Active:    Other Topics Concern  . Not on file   Social History Narrative   ** Merged History Encounter **      ROS:  All 11 ROS were addressed and are negative except  what is stated in the HPI  PHYSICAL EXAM Filed Vitals:   01/05/11 1357  BP: 136/87  Pulse: 115  Resp: 27   General: Well developed, well nourished, in no acute distress Head: Eyes PERRLA, No xanthomas.   Normal cephalic and atramatic  Lungs:   Crackles about 1/3 way up bilaterally. Heart:    S1 S2 Pulses are 2+ & equal .Ireegularly irregular            No carotid bruit. No JVD.  No abdominal bruits. No femoral bruits. Abdomen: Bowel sounds are positive, abdomen soft and non-tender without masses Extremities:   No clubbing, cyanosis or edema.  DP +1 Neuro: Alert and oriented X 3. Psych:  Good affect, responds appropriately   Labs:   Lab Results  Component Value Date   WBC 7.4 01/05/2011   HGB 13.4 01/05/2011   HCT 41.1 01/05/2011   MCV 94.7 01/05/2011   PLT 157 01/05/2011    Lab 01/05/11 1129  NA 139  K 3.5  CL 101  CO2 29  BUN 13  CREATININE 0.72  CALCIUM 9.1  PROT 7.6  BILITOT 0.5  ALKPHOS 122*  ALT 32  AST  31  GLUCOSE 302*   Lab Results  Component Value Date   CKTOTAL 188* 01/05/2011   CKMB 5.2* 01/05/2011   TROPONINI <0.30 01/05/2011   No results found for this basename: PTT   Lab Results  Component Value Date   INR 1.15 11/24/2010   INR 2.10* 11/16/2010   INR 1.13 09/18/2010     Lab Results  Component Value Date   CHOL 133 09/18/2010   Lab Results  Component Value Date   HDL 25* 09/18/2010   Lab Results  Component Value Date   LDLCALC 88 09/18/2010   Lab Results  Component Value Date   TRIG 100 09/18/2010   Lab Results  Component Value Date   CHOLHDL 5.3 09/18/2010       Radiology:    Study Result     *RADIOLOGY REPORT*  Clinical Data: Shortness of breath.  PORTABLE CHEST - 1 VIEW  Comparison: Chest x-ray 11/24/2010.  Findings: The heart is enlarged but stable. There is a diffuse  bilateral airspace process which could reflect pulmonary edema or  bilateral infiltrates. Small effusions are suspected. No  pneumothorax. The bony thorax  is intact.  IMPRESSION:  Bilateral airspace process, likely pulmonary edema. Bilateral  infiltrates are also possible.  Original Report Authenticated By: P. Loralie Champagne, M.D.      EKG:  Atrial fibrillation with RVR at 114bpm with no ST changes  ASSESSMENT:  1.  Acute on chronic diastolic heart failure most likely secondary to atrial fib with RVR 2.  Atrial fibrillation with RVR 3.  Normal coronary arteries by cath 2007 4.  DM 5.  HTN  PLAN:   1.  Admit to step down tele bed 2.  Cycle cardiac enzymes 3.  BiPAP per respiratory therapy 4.  IV Lasix 5.  DDimer 6.  BNP/BMET in am 7.  IV Cardizem gtt for heart rate control  Quintella Reichert, MD  01/05/2011  2:06 PM

## 2011-01-05 NOTE — Progress Notes (Signed)
Transferred patient to 2928. Patient was transferred off of Bipap.  Patient tolerating well.  RT will continue to monitor.

## 2011-01-05 NOTE — Progress Notes (Signed)
Took patient off of NIV.  Patient o2 sats are 100% on RA. Hr 63. bp 1324/89. Patient bbs are diminshed. Patients rr 18.  wob is a lot better than when patient arrived. Rt will continue to monitor.

## 2011-01-06 ENCOUNTER — Inpatient Hospital Stay (HOSPITAL_COMMUNITY): Payer: 59

## 2011-01-06 ENCOUNTER — Other Ambulatory Visit: Payer: Self-pay

## 2011-01-06 LAB — CARDIAC PANEL(CRET KIN+CKTOT+MB+TROPI)
CK, MB: 2.8 ng/mL (ref 0.3–4.0)
Relative Index: INVALID (ref 0.0–2.5)
Total CK: 127 U/L (ref 7–177)
Total CK: 87 U/L (ref 7–177)
Total CK: 99 U/L (ref 7–177)
Troponin I: <0.3 ng/mL (ref <0.30)

## 2011-01-06 LAB — GLUCOSE, CAPILLARY
Glucose-Capillary: 329 mg/dL — ABNORMAL HIGH (ref 70–99)
Glucose-Capillary: 406 mg/dL — ABNORMAL HIGH (ref 70–99)

## 2011-01-06 LAB — AFB CULTURE WITH SMEAR (NOT AT ARMC)

## 2011-01-06 LAB — BASIC METABOLIC PANEL WITH GFR
BUN: 13 mg/dL (ref 6–23)
CO2: 33 meq/L — ABNORMAL HIGH (ref 19–32)
Calcium: 9.6 mg/dL (ref 8.4–10.5)
Chloride: 98 meq/L (ref 96–112)
Creatinine, Ser: 0.71 mg/dL (ref 0.50–1.10)
GFR calc Af Amer: >90 mL/min (ref >90)
GFR calc non Af Amer: >90 mL/min (ref >90)
Glucose, Bld: 267 mg/dL — ABNORMAL HIGH (ref 70–99)
Potassium: 3.9 meq/L (ref 3.5–5.1)
Sodium: 139 meq/L (ref 135–145)

## 2011-01-06 LAB — TSH: TSH: 1.614 u[IU]/mL (ref 0.350–4.500)

## 2011-01-06 MED ORDER — INSULIN ASPART 100 UNIT/ML ~~LOC~~ SOLN
0.0000 [IU] | SUBCUTANEOUS | Status: DC
  Administered 2011-01-06 (×2): 15 [IU] via SUBCUTANEOUS
  Administered 2011-01-07: 5 [IU] via SUBCUTANEOUS
  Filled 2011-01-06: qty 3

## 2011-01-06 MED ORDER — DILTIAZEM HCL 60 MG PO TABS
60.0000 mg | ORAL_TABLET | Freq: Four times a day (QID) | ORAL | Status: DC
  Administered 2011-01-06 – 2011-01-07 (×6): 60 mg via ORAL
  Filled 2011-01-06 (×9): qty 1

## 2011-01-06 MED ORDER — FUROSEMIDE 40 MG PO TABS
40.0000 mg | ORAL_TABLET | Freq: Two times a day (BID) | ORAL | Status: DC
  Administered 2011-01-06 – 2011-01-07 (×2): 40 mg via ORAL
  Filled 2011-01-06 (×4): qty 1

## 2011-01-06 MED ORDER — FLECAINIDE ACETATE 100 MG PO TABS
100.0000 mg | ORAL_TABLET | Freq: Two times a day (BID) | ORAL | Status: DC
  Administered 2011-01-06 – 2011-01-07 (×3): 100 mg via ORAL
  Filled 2011-01-06 (×4): qty 1

## 2011-01-06 MED FILL — Furosemide Inj 10 MG/ML: INTRAMUSCULAR | Qty: 6 | Status: AC

## 2011-01-06 MED FILL — Albuterol Sulfate Soln Nebu 0.5% (5 MG/ML): RESPIRATORY_TRACT | Qty: 0.5 | Status: AC

## 2011-01-06 NOTE — Progress Notes (Signed)
Inpatient Diabetes Program Recommendations  AACE/ADA: New Consensus Statement on Inpatient Glycemic Control (2009)  Target Ranges:  Prepandial:   less than 140 mg/dL      Peak postprandial:   less than 180 mg/dL (1-2 hours)      Critically ill patients:  140 - 180 mg/dL    Inpatient Diabetes Program Recommendations Correction (SSI): ADD Novolog correction scale moderate TID+HS

## 2011-01-06 NOTE — Progress Notes (Signed)
UR Completed.  [name] 01/06/2011 [phone]  

## 2011-01-06 NOTE — Progress Notes (Signed)
SUBJECTIVE:  Feels better  OBJECTIVE:   Vitals:   Filed Vitals:   01/06/11 0500 01/06/11 0600 01/06/11 0700 01/06/11 0721  BP: 110/74 110/70 112/57   Pulse: 72 63 71   Temp:    97.4 F (36.3 C)  TempSrc:    Oral  Resp: 19 18 20    Height:      Weight: 106.1 kg (233 lb 14.5 oz)     SpO2: 100% 100% 100%    I&O's:   Intake/Output Summary (Last 24 hours) at 01/06/11 2130 Last data filed at 01/06/11 0600  Gross per 24 hour  Intake    744 ml  Output   4075 ml  Net  -3331 ml   TELEMETRY: Reviewed telemetry pt in AFib with controlled response:     PHYSICAL EXAM General: Well developed, well nourished, in no acute distress Head: Eyes PERRLA, No xanthomas.   Normal cephalic and atramatic  Lungs:   Scant basilar crackles Heart:  Irregularly irregular            . No JVD.  No abdominal bruits. No femoral bruits. Abdomen: Bowel sounds are positive, abdomen soft and non-tender without masses or                  Hernia's noted. Msk:  Back normal, normal gait. Normal strength and tone for age. Extremities:   1+ edema.  DP +1 Neuro: Alert and oriented X 3. Psych:  Good affect, responds appropriately   LABS: Basic Metabolic Panel:  Basename 01/05/11 1625 01/05/11 1129  NA 141 139  K 4.7 3.5  CL 104 101  CO2 30 29  GLUCOSE 265* 302*  BUN 12 13  CREATININE 0.71 0.72  CALCIUM 9.3 9.1  MG 1.9 --  PHOS -- --   Liver Function Tests:  [facility] 01/05/11 1625 01/05/11 1129  AST 26 31  ALT 30 32  ALKPHOS 107 122*  BILITOT 0.7 0.5  PROT 7.2 7.6  ALBUMIN 3.2* 3.3*   No results found for this basename: LIPASE:2,AMYLASE:2 in the last 72 hours CBC:  Basename 01/05/11 1129  WBC 7.4  NEUTROABS 5.0  HGB 13.4  HCT 41.1  MCV 94.7  PLT 157   Cardiac Enzymes:  Basename 01/05/11 2346 01/05/11 1138  CKTOTAL 127 188*  CKMB 4.1* 5.2*  CKMBINDEX -- --  TROPONINI <0.30 <0.30   BNP: No components found with this basename: POCBNP:3 D-Dimer: No results found for this basename:  DDIMER:2 in the last 72 hours Hemoglobin A1C: No results found for this basename: HGBA1C in the last 72 hours Fasting Lipid Panel: No results found for this basename: CHOL,HDL,LDLCALC,TRIG,CHOLHDL,LDLDIRECT in the last 72 hours Thyroid Function Tests:  Basename 01/05/11 1625  TSH 1.614  T4TOTAL --  T3FREE --  THYROIDAB --   Anemia Panel: No results found for this basename: VITAMINB12,FOLATE,FERRITIN,TIBC,IRON,RETICCTPCT in the last 72 hours Coag Panel:   Lab Results  Component Value Date   INR 1.27 01/05/2011   INR 1.15 11/24/2010   INR 2.10* 11/16/2010    RADIOLOGY: Dg Chest Port 1 View  01/06/2011  *RADIOLOGY REPORT*  Clinical Data: Shortness of breath  PORTABLE CHEST - 1 VIEW  Comparison: 01/05/2011; 11/24/2010  Findings:  Grossly unchanged enlarged cardiac silhouette and mediastinal contours.  The pulmonary vasculature remains indistinct with cephalization of flow.  Grossly unchanged small bilateral effusions and perihilar and basilar heterogeneous opacities.  No pneumothorax.  Unchanged bones.  IMPRESSION: 1.  Grossly unchanged findings of pulmonary edema. 2.  Bibasilar opacities, left  greater than right, possibly atelectasis though infection is not excluded.  Original Report Authenticated By: Waynard Reeds, M.D.   Dg Chest Portable 1 View  01/05/2011  *RADIOLOGY REPORT*  Clinical Data: Shortness of breath.  PORTABLE CHEST - 1 VIEW  Comparison: Chest x-ray 11/24/2010.  Findings: The heart is enlarged but stable.  There is a diffuse bilateral airspace process which could reflect pulmonary edema or bilateral infiltrates.  Small effusions are suspected.  No pneumothorax.  The bony thorax is intact.  IMPRESSION: Bilateral airspace process, likely pulmonary edema.  Bilateral infiltrates are also possible.  Original Report Authenticated By: P. Loralie Champagne, M.D.      ASSESSMENT: AFib with RVR, diastolic dysfunction; pulmonary edema.  PLAN:  1) AFib.  She does not tolerate AFib  with RVR.  Will start Flecainide to maintain NSR.  WIll wean IV diltiazem and start oral diltiazem.  No ischemia on nuclear study.  On Xarelto for stroke prevention. 2) Continue diuresis.  Low salt diet. Check BMEt.  Ruled out for MI. 3) Aggressive BP control.  D/C ramipril since already on ARB.   Beck Cofer S.  01/06/2011  8:14 AM

## 2011-01-07 LAB — BASIC METABOLIC PANEL
BUN: 17 mg/dL (ref 6–23)
CO2: 31 mEq/L (ref 19–32)
Calcium: 9.5 mg/dL (ref 8.4–10.5)
Creatinine, Ser: 0.76 mg/dL (ref 0.50–1.10)
GFR calc non Af Amer: >90 mL/min (ref >90)
Glucose, Bld: 96 mg/dL (ref 70–99)
Sodium: 140 mEq/L (ref 135–145)

## 2011-01-07 LAB — GLUCOSE, CAPILLARY
Glucose-Capillary: 104 mg/dL — ABNORMAL HIGH (ref 70–99)
Glucose-Capillary: 107 mg/dL — ABNORMAL HIGH (ref 70–99)
Glucose-Capillary: 201 mg/dL — ABNORMAL HIGH (ref 70–99)

## 2011-01-07 LAB — HEMOGLOBIN A1C: Mean Plasma Glucose: 309 mg/dL — ABNORMAL HIGH (ref <117)

## 2011-01-07 MED ORDER — POTASSIUM CHLORIDE CRYS ER 20 MEQ PO TBCR
40.0000 meq | EXTENDED_RELEASE_TABLET | Freq: Once | ORAL | Status: AC
  Administered 2011-01-07: 40 meq via ORAL
  Filled 2011-01-07: qty 2

## 2011-01-07 MED ORDER — FLECAINIDE ACETATE 100 MG PO TABS: 100.0000 mg | ORAL_TABLET | Freq: Two times a day (BID) | ORAL | Status: DC

## 2011-01-07 MED ORDER — DILTIAZEM HCL ER BEADS 240 MG PO CP24: 240.0000 mg | ORAL_CAPSULE | Freq: Every day | ORAL | Status: DC

## 2011-01-07 MED ORDER — FUROSEMIDE 40 MG PO TABS: 40.0000 mg | ORAL_TABLET | Freq: Every day | ORAL | Status: DC

## 2011-01-07 MED ORDER — POTASSIUM CHLORIDE 10 MEQ PO TBCR: 20.0000 meq | EXTENDED_RELEASE_TABLET | Freq: Every day | ORAL | Status: DC

## 2011-01-07 NOTE — Discharge Summary (Signed)
Patient ID: [name] MRN: [id number] DOB/AGE: March 06, 1955 55 y.o.  Admit date: 01/05/2011 Discharge date: 01/07/2011  Primary Discharge Diagnosis diastolic heart failure Secondary Discharge Diagnosis atrial fibrillation, hypertension, diabetes, obesity  Significant Diagnostic Studies: radiology: CXR: pulmonary edema  Consults: none  Hospital Course: The patient was admitted with shortness of breath.  She was found in atrial fibrillation with rapid ventricular response.  She was also severely hypoxic.  She required BiPAP while in the emergency room.  After diuresis, her breathing improved.  Her IV Lasix was switched to oral.  She was rate controlled with additional diltiazem.  Given that she is symptomatic when she is in atrial fibrillation, we decided to start her on antiarrhythmic to avoid atrial fibrillation future.  She was started on flecainide.  She tolerated this medicine well.  She remained rate-controlled A. fib.  There is no evidence of a prolonged QT interval.  Her potassium was somewhat low at times and this was supplemented.  This is likely related to her Lasix.  She wanted to go home on the day of discharge and felt ready.  Her shortness of breath had resolved completely.   Discharge Exam: Blood pressure 131/88, pulse 60, temperature 97.9 F (36.6 C), temperature source Oral, resp. rate 17, height 5\' 8"  (1.727 m), weight 103.7 kg (228 lb 9.9 oz), SpO2 98.00%.    Irregularly irregular, rate controlled Clear to auscultation bilaterally Soft nontender 1+ edema bilaterally in the lower extremities  Labs:   Lab Results  Component Value Date   WBC 7.4 01/05/2011   HGB 13.4 01/05/2011   HCT 41.1 01/05/2011   MCV 94.7 01/05/2011   PLT 157 01/05/2011    Lab 01/07/11 0505 01/05/11 1625  NA 140 --  K 3.3* --  CL 101 --  CO2 31 --  BUN 17 --  CREATININE 0.76 --  CALCIUM 9.5 --  PROT -- 7.2  BILITOT -- 0.7  ALKPHOS -- 107  ALT -- 30  AST -- 26  GLUCOSE 96 --    Lab Results  Component Value Date   CKTOTAL 87 01/06/2011   CKMB 2.8 01/06/2011   TROPONINI <0.30 01/06/2011    Lab Results  Component Value Date   CHOL 133 09/18/2010   Lab Results  Component Value Date   HDL 25* 09/18/2010   Lab Results  Component Value Date   LDLCALC 88 09/18/2010   Lab Results  Component Value Date   TRIG 100 09/18/2010   Lab Results  Component Value Date   CHOLHDL 5.3 09/18/2010   No results found for this basename: LDLDIRECT       EKG: Atrial fibrillation, no significant ST segment changes  FOLLOW UP PLANS AND APPOINTMENTS Discharge Orders    Future Appointments: Provider: Department: Dept Phone: Center:   01/14/2011 1:00 PM [facility] [phone] None   01/14/2011 2:15 PM Leslye Peer, MD [facility] [phone] None     Current Discharge Medication List    START taking these medications   Details  flecainide (TAMBOCOR) 100 MG tablet Take 1 tablet (100 mg total) by mouth every 12 (twelve) hours. Qty: 60 tablet, Refills: 11      CONTINUE these medications which have CHANGED   Details  diltiazem (TAZTIA XT) 240 MG 24 hr capsule Take 1 capsule (240 mg total) by mouth daily. Qty: 30 capsule, Refills: 11    furosemide (LASIX) 40 MG tablet Take 1 tablet (40 mg total) by mouth daily. Qty: 30 tablet,  Refills: 11    potassium chloride (KLOR-CON) 10 MEQ CR tablet Take 2 tablets (20 mEq total) by mouth daily. Qty: 60 tablet, Refills: 11      CONTINUE these medications which have NOT CHANGED   Details  albuterol (VENTOLIN HFA) 108 (90 BASE) MCG/ACT inhaler Inhale 2 puffs into the lungs every 6 (six) hours as needed. For shortness of breath    calcium carbonate (OS-CAL - DOSED IN MG OF ELEMENTAL CALCIUM) 1250 MG tablet Take 1 tablet by mouth daily.      Cholecalciferol (VITAMIN D) 1000 UNITS capsule Take 1,000 Units by mouth daily.     DIOVAN 320 MG tablet Take 320 mg by mouth daily. Once daily    insulin  glargine (LANTUS SOLOSTAR) 100 UNIT/ML injection Inject 30-35 Units into the skin See admin instructions. Inject 30 units once daily and inject 35 units daily at bedtime.    LIPITOR 40 MG tablet Take 40 mg by mouth daily. Once daily    nebivolol (BYSTOLIC) 5 MG tablet Take 5 mg by mouth daily.     Omega-3 Fatty Acids (FISH OIL) 1000 MG CAPS Take 1 capsule by mouth daily.     Rivaroxaban 20 MG TABS Take 1 tablet by mouth daily.       STOP taking these medications     aspirin 81 MG tablet      ramipril (ALTACE) 10 MG tablet        Follow-up Information    Follow up with Corky Crafts., MD in 1 week.   Contact information:   [address] [place] [address] [phone]          BRING ALL MEDICATIONS WITH YOU TO FOLLOW UP APPOINTMENTS  Time spent with patient to include physician time: 25 minutes Signed: Sota Hetz S. 01/07/2011, 8:45 AM

## 2011-01-07 NOTE — Progress Notes (Signed)
Pt ready for discharge per MD. VSS. IVs removed, catheters intact. Family at bedside. Pt discharged to home via wheelchair. Lamarr Lulas

## 2011-01-14 ENCOUNTER — Ambulatory Visit (INDEPENDENT_AMBULATORY_CARE_PROVIDER_SITE_OTHER): Payer: BC Managed Care – PPO | Admitting: Emergency Medicine

## 2011-01-14 ENCOUNTER — Encounter: Payer: Self-pay | Admitting: Emergency Medicine

## 2011-01-14 DIAGNOSIS — R599 Enlarged lymph nodes, unspecified: Secondary | ICD-10-CM

## 2011-01-14 DIAGNOSIS — J45909 Unspecified asthma, uncomplicated: Secondary | ICD-10-CM

## 2011-01-14 DIAGNOSIS — R590 Localized enlarged lymph nodes: Secondary | ICD-10-CM

## 2011-01-14 LAB — PULMONARY FUNCTION TEST

## 2011-01-14 NOTE — Assessment & Plan Note (Signed)
Likely mild. Her PFT's show largely restriction. Suspect her SOB has ben relayted to he Afib + RVR, now on flecainide + anticoag w xarelto.

## 2011-01-14 NOTE — Assessment & Plan Note (Signed)
Smaller on CT from 10/12, AND [name] needle bx';s negative on ENB - plan repeat scan in 10/2011

## 2011-01-14 NOTE — Patient Instructions (Signed)
Please continue to use your albuterol inhaler 2 puffs if needed for shortness of breath We will need to repeat your CT scan of the chest in October 2013.  Follow with Dr Delton Coombes in 6 months or sooner if you have any problems

## 2011-01-14 NOTE — Progress Notes (Signed)
PFT done today. 

## 2011-01-14 NOTE — Progress Notes (Signed)
  Subjective:    Patient ID: [name], female    DOB: 04/30/1955, 56 y.o.   MRN: [id number]  HPI 56 yo woman, hx HTN, DM, never smoker. Also carries dx asthma since 25's. Was admitted to [facility] 9/12 for A Fib + RVR. Part of the eval included CT-PA that showed no PE but paratracheal node and perihiliar nodes enlarged.   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 Returns after repeat scan done 10/18/10 as below, shows continued LAD with some decrease in size in the interval. Breathing has been OK, has only needed SABA once since last time.   ROV 01/14/11 -- hx ? asthma, A Fib RVR, mediastinal LAD, s/p [name] needle bx's >> showed normal lymphocytes. Has had some dyspnea, uses SABA prn. [facility] Fib + RVR - started flecainide and xarelto. PFFT today with restriction and possible obstruction.     Objective:   Physical Exam  Gen: Pleasant, obese woman, in no distress,  normal affect  ENT: No lesions,  mouth clear,  oropharynx clear, no postnasal drip  Neck: No JVD, no TMG, no carotid bruits  Lungs: No use of accessory muscles, no dullness to percussion, clear without rales or rhonchi  Cardiovascular: RRR, heart sounds normal, no murmur or gallops, no peripheral edema  Musculoskeletal: No deformities, no cyanosis or clubbing  Neuro: alert, non focal  Skin: Warm, no lesions or rashes     Assessment & Plan:  Mediastinal lymphadenopathy Smaller on CT from 10/12, AND [name] needle bx';s negative on ENB - plan repeat scan in 10/2011  Asthma Likely mild. Her PFT's show largely restriction. Suspect her SOB has ben relayted to he Afib + RVR, now on flecainide + anticoag w xarelto.

## 2011-02-18 ENCOUNTER — Encounter: Payer: Self-pay | Admitting: Internal Medicine

## 2011-02-24 ENCOUNTER — Ambulatory Visit (INDEPENDENT_AMBULATORY_CARE_PROVIDER_SITE_OTHER): Payer: BC Managed Care – PPO | Admitting: Internal Medicine

## 2011-02-24 VITALS — BP 138/72 | HR 166 | Ht 66.0 in | Wt 230.0 lb

## 2011-02-24 DIAGNOSIS — I509 Heart failure, unspecified: Secondary | ICD-10-CM

## 2011-02-24 DIAGNOSIS — R0989 Other specified symptoms and signs involving the circulatory and respiratory systems: Secondary | ICD-10-CM

## 2011-02-24 DIAGNOSIS — I4891 Unspecified atrial fibrillation: Secondary | ICD-10-CM

## 2011-02-24 DIAGNOSIS — R0683 Snoring: Secondary | ICD-10-CM

## 2011-02-24 MED ORDER — DILTIAZEM HCL ER BEADS 360 MG PO CP24: 240.0000 mg | ORAL_CAPSULE | Freq: Every day | ORAL | Status: DC

## 2011-02-24 MED ORDER — NEBIVOLOL HCL 10 MG PO TABS: 10.0000 mg | ORAL_TABLET | Freq: Every day | ORAL | Status: DC

## 2011-02-24 NOTE — Patient Instructions (Signed)
Your physician recommends that you schedule a follow-up appointment in: 6 weeks with Dr Johney Frame  Dr Hoyle Barr office is going to contact you regarding sleep study and a Cardioversion  Your physician has recommended you make the following change in your medication:  1) Increase Diltiazem to 360mg  daily 2) Increase Bystolic to 10mg  daily

## 2011-02-24 NOTE — Progress Notes (Signed)
Primary Care Physician: [name],[name], PA, PA Referring Physician:  Dr Marye Round is a 56 y.o. female with a h/o persistent atrial fibrillation who presents today for EP consultation.  She initially presented 9/12 to [facility] with CP and SOB.  She was found to have afib with RVR.  She was placed on cardizem for rate control as well as xarelto.  She did well until 12/12 when she returned with afib and RVR.  She presented with diastolic CHF for which she required BiPAP.  She was subsequently placed on flecainide.  She remains in afib with flecainide.  She has not been cardioverted previously.  Her exercise tolerance is chronically depressed.  She snores at night and is tired in the am.  Today, she denies symptoms of palpitations, chest pain, shortness of breath, orthopnea, PND, lower extremity edema, dizziness, presyncope, syncope, or neurologic sequela. The patient is tolerating medications without difficulties and is otherwise without complaint today.   Past Medical History  Diagnosis Date  . Diabetes mellitus   . Asthma   . Hypertension   . Obesity   . Chronic diastolic congestive heart failure   . Carotid bruit   . Hypercholesterolemia   . Polyp of rectum   . Abnormal chest CT   . Persistent atrial fibrillation   . Bronchospasm 1998   Past Surgical History  Procedure Date  . Cardiovascular stress test 09/2010  . Cardiac catheterization >5 years    Current Outpatient Prescriptions  Medication Sig Dispense Refill  . albuterol (VENTOLIN HFA) 108 (90 BASE) MCG/ACT inhaler Inhale 2 puffs into the lungs every 6 (six) hours as needed. For shortness of breath      . calcium carbonate (OS-CAL - DOSED IN MG OF ELEMENTAL CALCIUM) 1250 MG tablet Take 1 tablet by mouth daily.        . Cholecalciferol (VITAMIN D) 1000 UNITS capsule Take 1,000 Units by mouth daily.       [name] diltiazem (TAZTIA XT) 240 MG 24 hr capsule Take 1 capsule (240 mg total) by mouth daily.  30 capsule  11  .  DIOVAN 320 MG tablet Take 320 mg by mouth daily. Once daily      . flecainide (TAMBOCOR) 100 MG tablet Take 1 tablet (100 mg total) by mouth every 12 (twelve) hours.  60 tablet  11  . furosemide (LASIX) 40 MG tablet Take 1 tablet (40 mg total) by mouth daily.  30 tablet  11  . insulin glargine (LANTUS SOLOSTAR) 100 UNIT/ML injection Inject 30-35 Units into the skin See admin instructions. Inject 30 units once daily and inject 35 units daily at bedtime.      [name] LIPITOR 40 MG tablet Take 40 mg by mouth daily. Once daily      . nebivolol (BYSTOLIC) 5 MG tablet Take 5 mg by mouth daily.       . Omega-3 Fatty Acids (FISH OIL) 1000 MG CAPS Take 1 capsule by mouth daily.       . potassium chloride (KLOR-CON) 10 MEQ CR tablet Take 2 tablets (20 mEq total) by mouth daily.  60 tablet  11  . Rivaroxaban 20 MG TABS Take 1 tablet by mouth daily.         No Known Allergies  History   Social History  . Marital Status: Married    Spouse Name: N/A    Number of Children: N/A  . Years of Education: N/A   Occupational History  . 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 Smoking status: Never Smoker   . Smokeless tobacco: Never Used  . Alcohol Use: No  . Drug Use: No  . Sexually Active:    Other Topics Concern  . Not on file   Social History Narrative   Pt lives in [place] with spouse.  Works at [employer]. 2 grown children, 1 grandchild    Family History  Problem Relation Age of Onset  . Asthma Mother     ROS- All systems are reviewed and negative except as per the HPI above  Physical Exam: Filed Vitals:   02/24/11 1457  BP: 138/72  Pulse: 166  Height: 5\' 6"  (1.676 m)  Weight: 230 lb (104.327 kg)    GEN- The patient is overweight appearing, alert and oriented x 3 today.   Head- normocephalic, atraumatic Eyes-  Sclera clear, conjunctiva pink Ears- hearing intact Oropharynx- clear Neck- supple, no JVP Lymph- no cervical  lymphadenopathy Lungs- Clear to ausculation bilaterally, normal work of breathing Heart- irregular rate and rhythm,  GI- soft, NT, ND, + BS Extremities- no clubbing, cyanosis, or edema  EKG today reveals afib, V rate 166 bpm, nonspecific St/T changes  Assessment and Plan:

## 2011-02-28 ENCOUNTER — Telehealth: Payer: Self-pay | Admitting: Internal Medicine

## 2011-02-28 DIAGNOSIS — R0683 Snoring: Secondary | ICD-10-CM | POA: Insufficient documentation

## 2011-02-28 NOTE — Assessment & Plan Note (Signed)
The patient has persistent atrial fibrillation.  She has been initiated on flecainide but has not been cardioverted.  Her LA size is 57mm by recent echo suggesting probably longstanding afib.  I suspect that our ability to maintain sinus rhythm long term is quite limited. At this time, I would recommend cardioversion by Dr Eldridge Dace.  We will then determine whether she can be maintained in sinus on flecainide long term or whether she will require a more aggressive effort with amiodarone.  Ultimately, she may require a convergent procedure at [facility] or rate control as our long term strategy given her severe LA enlargement. I will increase rate control medications today.

## 2011-02-28 NOTE — Telephone Encounter (Signed)
Called pt back no answer  

## 2011-02-28 NOTE — Assessment & Plan Note (Signed)
Sleep study and weight loss advised She will need sleep study with Dr Mayford Knife arranged by Dr Eldridge Dace.

## 2011-02-28 NOTE — Telephone Encounter (Signed)
Pt having trouble getting her medication per insurance the dose is too high

## 2011-02-28 NOTE — Assessment & Plan Note (Signed)
Likely due to elevated V rates Increase bystolic and diltiazem today

## 2011-03-02 NOTE — Telephone Encounter (Signed)
She is to have a DCCV with Dr Nechama Guard and also arrange a sleep study through that office

## 2011-03-02 NOTE — Telephone Encounter (Signed)
Called again. No answer

## 2011-03-23 ENCOUNTER — Other Ambulatory Visit: Payer: Self-pay | Admitting: Interventional Cardiology

## 2011-03-23 ENCOUNTER — Other Ambulatory Visit: Payer: Self-pay | Admitting: Cardiology

## 2011-03-23 NOTE — H&P (Signed)
PRE CARDIOVERSION H&P.       HPI:  General:  [name] is a 56 yo female followed by Dr Eldridge Dace who was hospitalized with initial DX of PAF 09/2010 after admission with chest pain. At that time cardiac enzymes were negative and nuclear stress test without ischemia. Echocardiogram with EF 55-60%, PAP 57 and mild to moderate MR. She was later readmitted 12/12 due to diastolic heart failure with pulmonary edema. She was diuresed and started on Flecainide. After recent consultation with Dr Johney Frame, she had a sleep study with noted severe sleep apnea and plans to also proceed with a cardioversion from the Atrial fibrillation. She has been on Xarelto therapy since 9/12..  Patient denies chest pain, palpitations, dizziness, syncope, swelling, nor PND. She does have SOB when walking a long distance, walking into office without difficulty.       ROS:  as noted in HPi, no GI complaitns, no black or bloody BMs, no neurological changes, no headaches, no fever, chills, nor congestion,.       Medical History: HTN, Murmur, Carotid Bruits, ADOM - 05/2005, Bronchospasm - 1998, Obesity, Rectal polyps, High Cholesterol, A fib (2012), Chronic Diastolic Heart Failure.        Surgical History: BTL 1997, colonoscopy with polyp resection 10/2005 .        Hospitalization/Major Diagnostic Procedure: None .        Family History: Father: deceased 72 yrs Mother: NiDDM, Elevated BP  Negative from GI standpoint.       Social History:  General: History of smoking cigarettes: Never smoked. no Alcohol. Exercise: nothing structured. Occupation: employed, [employer]. Marital Status: married.        Medications: Diltiazem HCl ER Beads 240 MG Capsule Extended Release 24 Hour 1 capsule Once a day, Bystolic 5 MG Tablet 1 tablet Once a day, Diovan 320 MG Tablet 1 tablet Once a day, Flecainide Acetate 100 MG Tablet 1 tablet every 12 hrs, Xarelto 20 mg . tablet 1 tablet once a day, Lasix 40 MG Tablet 1 tablet (New rx  01/07/11) Once a day, Potassium Chloride 10 MEQ Capsule Extended Release 2 capsules Once a day, Lantus SoloStar 100 UNIT/ML Solution 30 units am/34 units pm Once a day, Ventolin HFA 108 (90 Base) MCG/ACT Aerosol Solution 2 puffs as needed every 4 hrs, Calcium 1500 MG Tablet 1 tablet with food Once a day, Vitamin D Capsule 1 capsule Once a day, Fish Oil 1000MG  Capsule 1 tablet once a day, One touch ultra test strips . strips use to check your blood sugar before your meal once a day, OneTouch Delica Lancets Delica Lancets as directed once a day, BD Pen Needle Nano U/F 32G X 4 MM Miscellaneous as directed with Lantus SoloStar once a day, Lipitor 40 MG Tablet 1 tablet Once a day, OneTouch Ultra Test Strip CHECK BLOOD SUGAR ONCE DAILY BEFORE A MEAL , Medication List reviewed and reconciled with the patient       Allergies: N.K.D.A.      Objective:     Vitals: Wt 227, Wt change 2 lb, Ht 66, BMI 36.63, Pulse sitting 80, BP sitting 130/90.       Examination:  General Examination:  GENERAL APPEARANCE alert, oriented, NAD, pleasant. HEAD: [place]/AT. EYES: EOMI, Conjunctiva clear, no drainage. LUNGS: clear to auscultation bilaterally, no wheezes, rhonchi, rales. HEART: irregularly irregular rhythm, regular rate,, normal S1S2. ABDOMEN: soft and not tender, non-distended, + bowels sounds. EXTREMITIES: trace pretibial edema. NEUROLOGIC EXAM: non-focal exam. PERIPHERAL PULSES: normal (  2+) bilaterally. PSYCH appropriate mood and affect .        Assessment:     Assessment:  1. Atrial fibrillation - 427.31 (Primary), for cardioversion later this week, remains in AF rate controlled  2. Essential hypertension, benign - 401.1  3. Hypercholesterolemia - 272.0  4. Sleep apnea - 780.57    Plan:     1. Atrial fibrillation Continue Diltiazem HCl ER Beads Capsule Extended Release 24 Hour, 240 MG, 1 capsule, Orally (New rx 12/28/10), Once a day ; Continue Bystolic Tablet, 5 MG, 1 tablet, Orally, Once a day ; Continue  Flecainide Acetate Tablet, 100 MG, 1 tablet, Orally (new ex 01/07/11), every 12 hrs ; Continue Xarelto 20 mg tablet, ., 1 tablet, orally, once a day .  LAB: Basic Metabolic    GLUCOSE 354 70-99 - mg/dL H   BUN 11 1-61 - mg/dL    CREATININE 0.96 0.45-4.09 - mg/dl    eGFR (NON-AFRICAN AMERICAN) 66 >60 - calc    eGFR (AFRICAN AMERICAN) 80 >60 - calc    SODIUM 135 136-145 - mmol/L L   POTASSIUM 3.9 3.5-5.5 - mmol/L    CHLORIDE 98 98-107 - mmol/L    C02 31 22-32 - mg/dL    ANION GAP 9.7 8.1-19.1 - mmol/L    CALCIUM 9.5 8.6-10.3 - mg/dL     [name] A 03/22/2011 04:35:18 PM > ok for cardioversion but please send to PCP for her hyperglycemia Harward,Amy 03/22/2011 04:37:57 PM > To Dr. Sherlyn Lick.    LAB: CBC with Diff    WBC 7.0 4.0-11.0 - K/ul    RBC 4.62 4.20-5.40 - M/uL    HGB 14.4 12.0-16.0 - g/dL    HCT 47.8 29.5-62.1 - %    MCH 31.2 27.0-33.0 - pg    MPV 9.5 7.5-10.7 - fL    MCV 95.1 81.0-99.0 - fL    MCHC 32.8 32.0-36.0 - g/dL    RDW 30.8 65.7-84.6 - %    PLT 157 150-400 - K/uL    NEUT % 58.8 43.3-71.9 - %    LYMPH% 30.9 16.8-43.5 - %    MONO % 8.3 4.6-12.4 - %    EOS % 1.6 0.0-7.8 - %    BASO % 0.4 0.0-1.0 - %    NEUT # 4.1 1.9-7.2 - K/uL    LYMPH# 2.20 1.10-2.70 - K/uL    MONO # 0.6 0.3-0.8 - K/uL    EOS # 0.1 0.0-0.6 - K/uL    BASO # 0.0 0.0-0.1 - K/uL     [name] A 03/22/2011 04:35:59 PM > ok for cardioversion    LAB: PT and PTT ([id number])    aPTT 33 24-33 - SEC    INR 1.3 0.8-1.2 - H   Prothrombin Time 13.5 9.1-12.0 - SEC H    [name] A 03/23/2011 12:06:19 PM > ok, on Xarelto for cardioversion    Reviewed Cardioversion procedure including potential risk including skin irritation, strokes, arrthymias, and reaction to sedatives, pt states she has not missed any of her Xarelto. she is willing to proceed with cardioversion, I have also encouraged her to follow up with 2nd portion of sleep study after OSA noted, she agrees to as Dr Johney Frame ordered.         2. Essential hypertension, benign Continue Diovan Tablet, 320 MG, 1 tablet, Orally, Once a day ; Continue Lasix Tablet, 40 MG, 1 tablet (New rx 01/07/11), Orally, Once a day ; Continue Potassium Chloride Capsule Extended Release, 10 MEQ, 2 capsules,  Orally (new rx 01/07/11), Once a day .       3. Hypercholesterolemia Continue Fish Oil Capsule, 1000MG , 1 tablet, Orally, once a day ; Continue Lipitor Tablet, 40 MG, 1 tablet, Orally, Once a day .       4. Others Continue Lantus SoloStar Solution, 100 UNIT/ML, 30 units am/34 units pm, Subcutaneous, Once a day .        Immunizations:        Labs:        Procedure Codes: 16109 ECL BMP, 85025 ECL CBC PLATELET DIFF, 60454 BLOOD COLLECTION ROUTINE VENIPUNCTURE       Preventive:         Follow Up: JV pening cardioversion (Reason: Atrial fibrillation, CHF)

## 2011-03-25 ENCOUNTER — Other Ambulatory Visit: Payer: Self-pay

## 2011-03-25 ENCOUNTER — Ambulatory Visit (HOSPITAL_COMMUNITY)
Admission: RE | Admit: 2011-03-25 | Discharge: 2011-03-25 | Disposition: A | Discharge status: Home / Self Care | Payer: BC Managed Care – PPO | Source: Ambulatory Visit | Attending: Interventional Cardiology | Admitting: Interventional Cardiology

## 2011-03-25 ENCOUNTER — Encounter (HOSPITAL_COMMUNITY)
Admission: RE | Disposition: A | Discharge status: Home / Self Care | Payer: Self-pay | Source: Ambulatory Visit | Attending: Interventional Cardiology

## 2011-03-25 ENCOUNTER — Encounter (HOSPITAL_COMMUNITY): Payer: Self-pay | Admitting: Anesthesiology

## 2011-03-25 ENCOUNTER — Ambulatory Visit (HOSPITAL_COMMUNITY): Payer: BC Managed Care – PPO | Admitting: Anesthesiology

## 2011-03-25 DIAGNOSIS — I4891 Unspecified atrial fibrillation: Secondary | ICD-10-CM | POA: Insufficient documentation

## 2011-03-25 HISTORY — PX: CARDIOVERSION: SHX1299

## 2011-03-25 SURGERY — CARDIOVERSION
Anesthesia: Monitor Anesthesia Care | Wound class: Clean

## 2011-03-25 MED ORDER — HYDROCORTISONE 1 % EX CREA: 1.0000 "application " | TOPICAL_CREAM | Freq: Three times a day (TID) | CUTANEOUS | Status: DC | PRN

## 2011-03-25 MED ORDER — PROPOFOL 10 MG/ML IV BOLUS
INTRAVENOUS | Status: DC | PRN
  Administered 2011-03-25: 80 mg via INTRAVENOUS

## 2011-03-25 MED ORDER — SODIUM CHLORIDE 0.9 % IJ SOLN: 3.0000 mL | INTRAMUSCULAR | Status: DC | PRN

## 2011-03-25 MED ORDER — SODIUM CHLORIDE 0.9 % IV SOLN: 250.0000 mL | INTRAVENOUS | Status: DC

## 2011-03-25 MED ORDER — SODIUM CHLORIDE 0.9 % IJ SOLN: 3.0000 mL | Freq: Two times a day (BID) | INTRAMUSCULAR | Status: DC

## 2011-03-25 NOTE — Discharge Instructions (Addendum)
Make sure to continue Xarelto and other meds without interruption  Electrical Cardioversion Cardioversion is the delivery of a jolt of electricity to change the rhythm of the heart. Sticky patches or metal paddles are placed on the chest to deliver the electricity from a special device. This is done to restore a normal rhythm. A rhythm that is too fast or not regular keeps the heart from pumping well. Compared to medicines used to change an abnormal rhythm, cardioversion is faster and works better. It is also unpleasant and may dislodge blood clots from the heart. WHEN WOULD THIS BE DONE?  In an emergency:   There is low or no blood pressure as a result of the heart rhythm.   Normal rhythm must be restored as fast as possible to protect the brain and heart from further damage.   It may save a life.   For less serious heart rhythms, such as atrial fibrillation or flutter, in which:   The heart is beating too fast or is not regular.   The heart is still able to pump enough blood, but not as well as it should.   Medicine to change the rhythm has not worked.   It is safe to wait in order to allow time for preparation.  LET YOUR CAREGIVER KNOW ABOUT:   Every medicine you are taking. It is very important to do this! Know when to take or stop taking any of them.   Any time in the past that you have felt your heart was not beating normally.  RISKS AND COMPLICATIONS   Clots may form in the chambers of the heart if it is beating too fast. These clots may be dislodged during the procedure and travel to other parts of the body.   There is risk of a stroke during and after the procedure if a clot moves. Blood thinners lower this risk.   You may have a special test of your heart (TEE) to make sure there are no clots in your heart.  BEFORE THE PROCEDURE   You may have some tests to see how well your heart is working.   You may start taking blood thinners so your blood does not clot as  easily.   Other drugs may be given to help your heart work better.  PROCEDURE (SCHEDULED)  The procedure is typically done in a hospital by a heart doctor (cardiologist).   You will be told when and where to go.   You may be given some medicine through an intravenous (IV) access to reduce discomfort and make you sleepy before the procedure.   Your whole body may move when the shock is delivered. Your chest may feel sore.   You may be able to go home after a few hours. Your heart rhythm will be watched to make sure it does not change.  HOME CARE INSTRUCTIONS   Only take medicine as directed by your caregiver. Be sure you understand how and when to take your medicine.   Learn how to feel your pulse and check it often.   Limit your activity for 48 hours.   Avoid caffeine and other stimulants as directed.  SEEK MEDICAL CARE IF:   You feel like your heart is beating too fast or your pulse is not regular.   You have any questions about your medicines.   You have bleeding that will not stop.  SEEK IMMEDIATE MEDICAL CARE IF:   You are dizzy or feel faint.   It  is hard to breathe or you feel short of breath.   There is a change in discomfort in your chest.   Your speech is slurred or you have trouble moving your arm or leg on one side.   You get a muscle cramp.   Your fingers or toes turn cold or blue.  MAKE SURE YOU:   Understand these instructions.   Will watch your condition.   Will get help right away if you are not doing well or get worse.  Document Released: 12/17/2001 Document Revised: 12/16/2010 Document Reviewed: 04/18/2007 [facility] Patient Information 2012 [place], [place].

## 2011-03-25 NOTE — Transfer of Care (Signed)
Immediate Anesthesia Transfer of Care Note  Patient: [name]  Procedure(s) Performed: Procedure(s) (LRB): CARDIOVERSION (N/A)  Patient Location: PACU and Short Stay  Anesthesia Type: MAC  Level of Consciousness: awake, alert , oriented and patient cooperative  Airway & Oxygen Therapy: Patient Spontanous Breathing and Patient connected to nasal cannula oxygen  Post-op Assessment: Report given to PACU RN and Post -op Vital signs reviewed and stable  Post vital signs: Reviewed and stable  Complications: No apparent anesthesia complications

## 2011-03-25 NOTE — Anesthesia Procedure Notes (Signed)
Procedure Name: MAC Date/Time: 03/25/2011 12:13 PM Performed by: Leona Singleton A Pre-anesthesia Checklist: Patient identified Patient Re-evaluated:Patient Re-evaluated prior to inductionOxygen Delivery Method: Ambu bag Preoxygenation: Pre-oxygenation with 100% oxygen Intubation Type: IV induction Ventilation: Mask ventilation without difficulty and Oral airway inserted - appropriate to patient size

## 2011-03-25 NOTE — Preoperative (Signed)
Beta Blockers   Reason not to administer Beta Blockers:Not Applicable 

## 2011-03-25 NOTE — CV Procedure (Signed)
Electrical Cardioversion Procedure Note [name] [id number] 01/08/1956  Procedure: Electrical Cardioversion Indications:  Atrial Fibrillation  Time Out: Verified patient identification, verified procedure,medications/allergies/relevent history reviewed, required imaging and test results available.  Performed  Procedure Details  The patient was NPO after midnight. Anesthesia was administered at the beside  by Dr. Malen Gauze with 80 mg of propofol.  Cardioversion was done with synchronized biphasic defibrillation with AP pads with 120 J and then successfully with 150 J.  The patient converted to normal sinus rhythm. The patient tolerated the procedure well.  Likely sleep apnea based on response to anesthesia.  IMPRESSION:  Successful cardioversion of atrial fibrillation.  Continue Xarelto for stroke prevention.    Earnstine Meinders S. 03/25/2011, 12:25PM

## 2011-03-25 NOTE — Anesthesia Preprocedure Evaluation (Addendum)
Anesthesia Evaluation  Patient identified by MRN, date of birth, ID band Patient awake    Reviewed: Allergy & Precautions, H&P , NPO status , Patient's Chart, lab work & pertinent test results, reviewed documented beta blocker date and time   History of Anesthesia Complications Negative for: history of anesthetic complications  Airway Mallampati: II TM Distance: >3 FB Neck ROM: Full    Dental  (+) Teeth Intact and Dental Advisory Given   Pulmonary asthma , sleep apnea (severe OSA; has not had fit test yet) ,          Cardiovascular Exercise Tolerance: Poor hypertension, Pt. on medications and Pt. on home beta blockers +CHF + dysrhythmias Atrial Fibrillation + Valvular Problems/Murmurs MR     Neuro/Psych negative neurological ROS  negative psych ROS   GI/Hepatic negative GI ROS, Neg liver ROS,   Endo/Other  Diabetes mellitus-, Type 2, Insulin DependentMorbid obesity  Renal/GU negative Renal ROS     Musculoskeletal negative musculoskeletal ROS (+)   Abdominal (+) + obese,   Peds negative pediatric ROS (+)  Hematology  (+) Blood dyscrasia (currently on blood thinner), ,   Anesthesia Other Findings   Reproductive/Obstetrics negative OB ROS                          Anesthesia Physical Anesthesia Plan  ASA: III  Anesthesia Plan: MAC   Post-op Pain Management:    Induction: Intravenous  Airway Management Planned: Mask  Additional Equipment:   Intra-op Plan:   Post-operative Plan:   Informed Consent: I have reviewed the patients History and Physical, chart, labs and discussed the procedure including the risks, benefits and alternatives for the proposed anesthesia with the patient or authorized representative who has indicated his/her understanding and acceptance.   Dental advisory given  Plan Discussed with: Anesthesiologist  Anesthesia Plan Comments:         Anesthesia Quick  Evaluation

## 2011-03-25 NOTE — H&P (Signed)
  Date of Initial H&P: 03/22/11  History reviewed, patient examined, no change in status, stable for surgery.

## 2011-03-27 NOTE — Anesthesia Postprocedure Evaluation (Signed)
  Anesthesia Post-op Note  Patient: [name]  Procedure(s) Performed: Procedure(s) (LRB): CARDIOVERSION (N/A)  Patient Location: Short Stay C  Anesthesia Type: MAC  Level of Consciousness: awake, alert  and oriented  Airway and Oxygen Therapy: Patient Spontanous Breathing  Post-op Pain: none  Post-op Assessment: Post-op Vital signs reviewed, Patient's Cardiovascular Status Stable, Respiratory Function Stable, Patent Airway and No signs of Nausea or vomiting  Post-op Vital Signs: Reviewed and stable  Complications: No apparent anesthesia complications

## 2011-03-29 ENCOUNTER — Encounter (HOSPITAL_COMMUNITY): Payer: Self-pay | Admitting: Interventional Cardiology

## 2011-04-04 ENCOUNTER — Encounter: Payer: Self-pay | Admitting: Internal Medicine

## 2011-04-04 ENCOUNTER — Ambulatory Visit (INDEPENDENT_AMBULATORY_CARE_PROVIDER_SITE_OTHER): Payer: BC Managed Care – PPO | Admitting: Internal Medicine

## 2011-04-04 VITALS — BP 125/85 | HR 111 | Resp 18 | Ht 67.0 in | Wt 220.1 lb

## 2011-04-04 DIAGNOSIS — I10 Essential (primary) hypertension: Secondary | ICD-10-CM

## 2011-04-04 DIAGNOSIS — I4891 Unspecified atrial fibrillation: Secondary | ICD-10-CM

## 2011-04-04 MED ORDER — NEBIVOLOL HCL 20 MG PO TABS: ORAL_TABLET | ORAL | Status: DC

## 2011-04-04 NOTE — Assessment & Plan Note (Addendum)
The patient has persistent atrial fibrillation.  Her LA size is 57mm by recent echo suggesting probably longstanding afib.  She has failed medical therapy with flecainide.  I have taken the liberty of stopping flecainide at this point. As she is minimally symptomatic, I would recommend rate control going forward.  If we are unable to achieve rate control, then we could consider amiodarone or convergent procedure at [facility] in the future.  She is not a candidate for typical endovascular afib ablation due to severe LA enlargement.  I will increase bystolic again today to 20mg  daily. She should continue long term anticoagulation.  I will see her again in 6 weeks.  IF she is doing well at that time, I would anticipate returning her care to Dr Eldridge Dace.

## 2011-04-04 NOTE — Progress Notes (Signed)
PCP:  [name],[name], PA, PA Primary Cardiologist:  Dr Eldridge Dace  The patient presents today for routine electrophysiology followup.  Since last being seen in our clinic, she was cardioverted by Dr Eldridge Dace.  Unfortunately, she has returned to an atypical atrial flutter at this time.  She is unaware of being out of rhythm today and does not recall feeling significantly better when in sinus rhythm.  Today, she denies symptoms of palpitations, chest pain, shortness of breath, lower extremity edema, dizziness, presyncope, syncope, or neurologic sequela.  The patient feels that she is tolerating medications without difficulties and is otherwise without complaint today.   Past Medical History  Diagnosis Date  . Diabetes mellitus   . Asthma   . Hypertension   . Obesity   . Chronic diastolic congestive heart failure   . Carotid bruit   . Hypercholesterolemia   . Polyp of rectum   . Abnormal chest CT   . Persistent atrial fibrillation   . Bronchospasm 1998  . Atrial enlargement, left     severe   Past Surgical History  Procedure Date  . Cardiovascular stress test 09/2010  . Cardiac catheterization >5 years  . Cardioversion 03/25/2011    Procedure: CARDIOVERSION;  Surgeon: Corky Crafts, MD;  Location: [facility] OR;  Service: Cardiovascular;  Laterality: N/A;    Current Outpatient Prescriptions  Medication Sig Dispense Refill  . albuterol (VENTOLIN HFA) 108 (90 BASE) MCG/ACT inhaler Inhale 2 puffs into the lungs every 6 (six) hours as needed. For shortness of breath      . calcium carbonate (OS-CAL - DOSED IN MG OF ELEMENTAL CALCIUM) 1250 MG tablet Take 1 tablet by mouth daily.        . Cholecalciferol (VITAMIN D) 1000 UNITS capsule Take 1,000 Units by mouth daily.       [name] diltiazem (TIAZAC) 360 MG 24 hr capsule Take 1 capsule (360 mg total) by mouth daily.  30 capsule  11  . DIOVAN 320 MG tablet Take 320 mg by mouth daily. Once daily      . furosemide (LASIX) 40 MG tablet Take 1 tablet (40  mg total) by mouth daily.  30 tablet  11  . hydrocortisone cream 1 % Apply 1 application topically 3 (three) times daily as needed (skin irritation).  30 g    . insulin glargine (LANTUS SOLOSTAR) 100 UNIT/ML injection Inject 30-35 Units into the skin See admin instructions. Inject 30 units once daily and inject 35 units daily at bedtime.      [name] LIPITOR 40 MG tablet Take 40 mg by mouth daily. Once daily      . nebivolol 20 MG TABS Take 2 by mouth daily  60 tablet  11  . Omega-3 Fatty Acids (FISH OIL) 1000 MG CAPS Take 1 capsule by mouth daily.       . potassium chloride (KLOR-CON) 10 MEQ CR tablet Take 2 tablets (20 mEq total) by mouth daily.  60 tablet  11  . Rivaroxaban 20 MG TABS Take 1 tablet by mouth daily.       [name] DISCONTD: nebivolol (BYSTOLIC) 10 MG tablet Take 1 tablet (10 mg total) by mouth daily.  30 tablet  11    No Known Allergies  History   Social History  . Marital Status: Married    Spouse Name: N/A    Number of Children: N/A  . 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 Smoking status: Never Smoker   . Smokeless tobacco: Never Used  . Alcohol Use: No  . Drug Use: No  . Sexually Active:    Other Topics Concern  . Not on file   Social History Narrative   Pt lives in [place] with spouse.  Works at [employer]. 2 grown children, 1 grandchild    Family History  Problem Relation Age of Onset  . Asthma Mother     Physical Exam: Filed Vitals:   04/04/11 1422  BP: 125/85  Pulse: 111  Resp: 18  Height: 5\' 7"  (1.702 m)  Weight: 220 lb 1.9 oz (99.846 kg)    GEN- The patient is well appearing, alert and oriented x 3 today.   Head- normocephalic, atraumatic Eyes-  Sclera clear, conjunctiva pink Ears- hearing intact Oropharynx- clear Neck- supple, no JVP Lymph- no cervical lymphadenopathy Lungs- Clear to ausculation bilaterally, normal work of breathing Heart-  irregular rate and rhythm, no murmurs, rubs or gallops, PMI not laterally displaced GI- soft, NT, ND, + BS Extremities- no clubbing, cyanosis, trace edema  ekg today reveals atypical atrial flutter, V rate 105 bpm,   Assessment and Plan:

## 2011-04-04 NOTE — Assessment & Plan Note (Signed)
Stable No change required today  

## 2011-04-04 NOTE — Patient Instructions (Signed)
Your physician recommends that you schedule a follow-up appointment in: 6 weeks with Dr Johney Frame  Your physician has recommended you make the following change in your medication:  1) Stop Flecainide 2) Increase Bystolic 20mg  daily

## 2011-04-07 ENCOUNTER — Ambulatory Visit: Payer: BC Managed Care – PPO | Admitting: Internal Medicine

## 2011-04-09 ENCOUNTER — Encounter: Payer: Self-pay | Admitting: Internal Medicine

## 2011-05-11 ENCOUNTER — Encounter: Payer: Self-pay | Admitting: Internal Medicine

## 2011-05-11 ENCOUNTER — Ambulatory Visit (INDEPENDENT_AMBULATORY_CARE_PROVIDER_SITE_OTHER): Payer: BC Managed Care – PPO | Admitting: Internal Medicine

## 2011-05-11 VITALS — BP 118/78 | HR 66 | Resp 18 | Ht 67.0 in | Wt 223.0 lb

## 2011-05-11 DIAGNOSIS — T50904A Poisoning by unspecified drugs, medicaments and biological substances, undetermined, initial encounter: Secondary | ICD-10-CM

## 2011-05-11 DIAGNOSIS — I4891 Unspecified atrial fibrillation: Secondary | ICD-10-CM

## 2011-05-11 NOTE — Patient Instructions (Signed)
Your physician recommends that you schedule a follow-up appointment as needed  

## 2011-05-15 ENCOUNTER — Encounter: Payer: Self-pay | Admitting: Internal Medicine

## 2011-05-15 NOTE — Assessment & Plan Note (Signed)
The patient has persistent atrial fibrillation.  Her LA size is 57mm by recent echo suggesting probably longstanding afib.  She should continue long term anticoagulation.  She is presently well rate controlled and asymptomatic.  I have made no changes today. She will continue to follow with Dr Eldridge Dace and I will see as needed.

## 2011-05-15 NOTE — Progress Notes (Signed)
PCP:  [name],[name], PA, PA Primary Cardiologist:  Dr Eldridge Dace  The patient presents today for routine electrophysiology followup.  She has done well with rate control.  Today, she denies symptoms of palpitations, chest pain, shortness of breath, lower extremity edema, dizziness, presyncope, syncope, or neurologic sequela.  The patient feels that she is tolerating medications without difficulties and is otherwise without complaint today.   Past Medical History  Diagnosis Date  . Diabetes mellitus   . Asthma   . Hypertension   . Obesity   . Chronic diastolic congestive heart failure   . Carotid bruit   . Hypercholesterolemia   . Polyp of rectum   . Abnormal chest CT   . Persistent atrial fibrillation   . Bronchospasm 1998  . Atrial enlargement, left     severe   Past Surgical History  Procedure Date  . Cardiovascular stress test 09/2010  . Cardiac catheterization >5 years  . Cardioversion 03/25/2011    Procedure: CARDIOVERSION;  Surgeon: Corky Crafts, MD;  Location: [facility] OR;  Service: Cardiovascular;  Laterality: N/A;    Current Outpatient Prescriptions  Medication Sig Dispense Refill  . albuterol (VENTOLIN HFA) 108 (90 BASE) MCG/ACT inhaler Inhale 2 puffs into the lungs every 6 (six) hours as needed. For shortness of breath      . calcium carbonate (OS-CAL - DOSED IN MG OF ELEMENTAL CALCIUM) 1250 MG tablet Take 1 tablet by mouth daily.        . Cholecalciferol (VITAMIN D) 1000 UNITS capsule Take 1,000 Units by mouth daily.       [name] diltiazem (CARDIZEM CD) 240 MG 24 hr capsule       . diltiazem (TIAZAC) 360 MG 24 hr capsule Take 1 capsule (360 mg total) by mouth daily.  30 capsule  11  . DIOVAN 320 MG tablet Take 320 mg by mouth daily. Once daily      . furosemide (LASIX) 40 MG tablet Take 1 tablet (40 mg total) by mouth daily.  30 tablet  11  . hydrocortisone cream 1 % Apply 1 application topically 3 (three) times daily as needed (skin irritation).  30 g    . insulin  glargine (LANTUS SOLOSTAR) 100 UNIT/ML injection Inject 30-35 Units into the skin See admin instructions. Inject 30 units once daily and inject 35 units daily at bedtime.      [name] JANUMET 50-1000 MG per tablet       . LIPITOR 40 MG tablet Take 40 mg by mouth daily. Once daily      . nebivolol 20 MG TABS Take 2 by mouth daily  60 tablet  11  . Omega-3 Fatty Acids (FISH OIL) 1000 MG CAPS Take 1 capsule by mouth daily.       . ONE TOUCH ULTRA TEST test strip       . potassium chloride (KLOR-CON) 10 MEQ CR tablet Take 2 tablets (20 mEq total) by mouth daily.  60 tablet  11  . Rivaroxaban 20 MG TABS Take 1 tablet by mouth daily.       [name] DISCONTD: flecainide (TAMBOCOR) 100 MG tablet Take 1 tablet (100 mg total) by mouth every 12 (twelve) hours.  60 tablet  11    No Known Allergies  History   Social History  . Marital Status: Married    Spouse Name: N/A    Number of Children: N/A  . 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 Smoking status: Never Smoker   . Smokeless tobacco: Never Used  . Alcohol Use: No  . Drug Use: No  . Sexually Active:    Other Topics Concern  . Not on file   Social History Narrative   Pt lives in [place] with spouse.  Works at [employer]. 2 grown children, 1 grandchild    Family History  Problem Relation Age of Onset  . Asthma Mother     Physical Exam: Filed Vitals:   05/11/11 1239  BP: 118/78  Pulse: 66  Resp: 18  Height: 5\' 7"  (1.702 m)  Weight: 223 lb (101.152 kg)    GEN- The patient is well appearing, alert and oriented x 3 today.   Head- normocephalic, atraumatic Eyes-  Sclera clear, conjunctiva pink Ears- hearing intact Oropharynx- clear Neck- supple, no JVP Lymph- no cervical lymphadenopathy Lungs- Clear to ausculation bilaterally, normal work of breathing Heart- irregular rate and rhythm, no murmurs, rubs or gallops, PMI not laterally  displaced GI- soft, NT, ND, + BS Extremities- no clubbing, cyanosis, trace edema  ekg today reveals afib, V rate 52 bpm,   Assessment and Plan:

## 2011-06-14 NOTE — Progress Notes (Signed)
Addended by: Reine Just on: 06/14/2011 07:08 PM   Modules accepted: Orders

## 2011-07-26 ENCOUNTER — Telehealth: Payer: Self-pay | Admitting: Emergency Medicine

## 2011-07-26 NOTE — Telephone Encounter (Signed)
Forward 4 pages to Dr. Levy Pupa for review on 07-26-11 ym

## 2011-12-19 ENCOUNTER — Inpatient Hospital Stay (HOSPITAL_COMMUNITY)
Admission: EM | Admit: 2011-12-19 | Discharge: 2011-12-21 | DRG: 127 | Disposition: A | Discharge status: Home / Self Care | Payer: BC Managed Care – PPO | Attending: Interventional Cardiology | Admitting: Interventional Cardiology

## 2011-12-19 ENCOUNTER — Emergency Department (HOSPITAL_COMMUNITY): Payer: BC Managed Care – PPO

## 2011-12-19 DIAGNOSIS — Z860100 Personal history of colon polyps, unspecified: Secondary | ICD-10-CM

## 2011-12-19 DIAGNOSIS — I10 Essential (primary) hypertension: Secondary | ICD-10-CM

## 2011-12-19 DIAGNOSIS — I5032 Chronic diastolic (congestive) heart failure: Secondary | ICD-10-CM | POA: Diagnosis present

## 2011-12-19 DIAGNOSIS — I5033 Acute on chronic diastolic (congestive) heart failure: Secondary | ICD-10-CM

## 2011-12-19 DIAGNOSIS — R739 Hyperglycemia, unspecified: Secondary | ICD-10-CM

## 2011-12-19 DIAGNOSIS — R0603 Acute respiratory distress: Secondary | ICD-10-CM

## 2011-12-19 DIAGNOSIS — E119 Type 2 diabetes mellitus without complications: Secondary | ICD-10-CM | POA: Diagnosis present

## 2011-12-19 DIAGNOSIS — G4733 Obstructive sleep apnea (adult) (pediatric): Secondary | ICD-10-CM | POA: Diagnosis present

## 2011-12-19 DIAGNOSIS — I4891 Unspecified atrial fibrillation: Secondary | ICD-10-CM

## 2011-12-19 DIAGNOSIS — E669 Obesity, unspecified: Secondary | ICD-10-CM | POA: Diagnosis present

## 2011-12-19 DIAGNOSIS — J45909 Unspecified asthma, uncomplicated: Secondary | ICD-10-CM | POA: Diagnosis present

## 2011-12-19 DIAGNOSIS — Z79899 Other long term (current) drug therapy: Secondary | ICD-10-CM

## 2011-12-19 DIAGNOSIS — I059 Rheumatic mitral valve disease, unspecified: Secondary | ICD-10-CM | POA: Diagnosis present

## 2011-12-19 DIAGNOSIS — I509 Heart failure, unspecified: Secondary | ICD-10-CM

## 2011-12-19 DIAGNOSIS — I340 Nonrheumatic mitral (valve) insufficiency: Secondary | ICD-10-CM

## 2011-12-19 DIAGNOSIS — Z794 Long term (current) use of insulin: Secondary | ICD-10-CM

## 2011-12-19 DIAGNOSIS — E7800 Pure hypercholesterolemia, unspecified: Secondary | ICD-10-CM | POA: Diagnosis present

## 2011-12-19 HISTORY — DX: Sleep apnea, unspecified: G47.30

## 2011-12-19 HISTORY — DX: Shortness of breath: R06.02

## 2011-12-19 HISTORY — DX: Nonrheumatic mitral (valve) insufficiency: I34.0

## 2011-12-19 LAB — COMPREHENSIVE METABOLIC PANEL
Albumin: 3.6 g/dL (ref 3.5–5.2)
Alkaline Phosphatase: 121 U/L — ABNORMAL HIGH (ref 39–117)
BUN: 12 mg/dL (ref 6–23)
Chloride: 102 mEq/L (ref 96–112)
Creatinine, Ser: 0.66 mg/dL (ref 0.50–1.10)
GFR calc Af Amer: >90 mL/min (ref >90)
Glucose, Bld: 266 mg/dL — ABNORMAL HIGH (ref 70–99)
Total Bilirubin: 0.7 mg/dL (ref 0.3–1.2)
Total Protein: 7.7 g/dL (ref 6.0–8.3)

## 2011-12-19 LAB — PRO B NATRIURETIC PEPTIDE
Pro B Natriuretic peptide (BNP): 1004 pg/mL — ABNORMAL HIGH (ref 0–125)
Pro B Natriuretic peptide (BNP): 1138 pg/mL — ABNORMAL HIGH (ref 0–125)

## 2011-12-19 LAB — POCT I-STAT, CHEM 8
BUN: 16 mg/dL (ref 6–23)
Calcium, Ion: 1.13 mmol/L (ref 1.12–1.23)
TCO2: 25 mmol/L (ref 0–100)

## 2011-12-19 LAB — CBC WITH DIFFERENTIAL/PLATELET
Basophils Absolute: 0 10*3/uL (ref 0.0–0.1)
Basophils Relative: 0 % (ref 0–1)
Eosinophils Absolute: 0.1 10*3/uL (ref 0.0–0.7)
Eosinophils Relative: 3 % (ref 0–5)
HCT: 42.6 % (ref 36.0–46.0)
HCT: 39.2 % (ref 36.0–46.0)
Hemoglobin: 12.6 g/dL (ref 12.0–15.0)
Lymphocytes Relative: 32 % (ref 12–46)
Lymphs Abs: 1.6 10*3/uL (ref 0.7–4.0)
MCH: 30 pg (ref 26.0–34.0)
MCHC: 32.1 g/dL (ref 30.0–36.0)
MCV: 94.9 fL (ref 78.0–100.0)
MCV: 93.3 fL (ref 78.0–100.0)
Monocytes Absolute: 0.6 10*3/uL (ref 0.1–1.0)
Monocytes Absolute: 0.3 10*3/uL (ref 0.1–1.0)
Monocytes Relative: 4 % (ref 3–12)
RBC: 4.2 MIL/uL (ref 3.87–5.11)
RDW: 13.8 % (ref 11.5–15.5)
WBC: 7.2 10*3/uL (ref 4.0–10.5)

## 2011-12-19 LAB — TSH: TSH: 2.479 u[IU]/mL (ref 0.350–4.500)

## 2011-12-19 LAB — PROTIME-INR: INR: 1.46 (ref 0.00–1.49)

## 2011-12-19 LAB — APTT: aPTT: 38 seconds — ABNORMAL HIGH (ref 24–37)

## 2011-12-19 LAB — POCT I-STAT TROPONIN I: Troponin i, poc: 0.01 ng/mL (ref 0.00–0.08)

## 2011-12-19 LAB — GLUCOSE, CAPILLARY

## 2011-12-19 MED ORDER — FUROSEMIDE 10 MG/ML IJ SOLN
40.0000 mg | Freq: Two times a day (BID) | INTRAMUSCULAR | Status: DC
  Administered 2011-12-19 – 2011-12-21 (×5): 40 mg via INTRAVENOUS
  Filled 2011-12-19 (×6): qty 4

## 2011-12-19 MED ORDER — SODIUM CHLORIDE 0.9 % IV SOLN: 250.0000 mL | INTRAVENOUS | Status: DC | PRN

## 2011-12-19 MED ORDER — NEBIVOLOL HCL 20 MG PO TABS: 1.0000 | ORAL_TABLET | Freq: Every day | ORAL | Status: DC

## 2011-12-19 MED ORDER — ASPIRIN 81 MG PO CHEW
324.0000 mg | CHEWABLE_TABLET | Freq: Once | ORAL | Status: AC
  Administered 2011-12-19: 324 mg via ORAL
  Filled 2011-12-19: qty 4

## 2011-12-19 MED ORDER — DILTIAZEM HCL ER BEADS 240 MG PO CP24
240.0000 mg | ORAL_CAPSULE | Freq: Every day | ORAL | Status: DC
  Administered 2011-12-19 – 2011-12-21 (×3): 240 mg via ORAL
  Filled 2011-12-19 (×3): qty 1

## 2011-12-19 MED ORDER — SITAGLIPTIN PHOS-METFORMIN HCL 50-1000 MG PO TABS: 1.0000 | ORAL_TABLET | Freq: Every day | ORAL | Status: DC

## 2011-12-19 MED ORDER — FUROSEMIDE 10 MG/ML IJ SOLN
INTRAMUSCULAR | Status: AC
  Administered 2011-12-19: 40 mg via INTRAVENOUS
  Filled 2011-12-19: qty 4

## 2011-12-19 MED ORDER — SODIUM CHLORIDE 0.9 % IJ SOLN: 3.0000 mL | INTRAMUSCULAR | Status: DC | PRN

## 2011-12-19 MED ORDER — IRBESARTAN 300 MG PO TABS
300.0000 mg | ORAL_TABLET | Freq: Every day | ORAL | Status: DC
  Administered 2011-12-19 – 2011-12-21 (×3): 300 mg via ORAL
  Filled 2011-12-19 (×3): qty 1

## 2011-12-19 MED ORDER — ATORVASTATIN CALCIUM 40 MG PO TABS
40.0000 mg | ORAL_TABLET | Freq: Every day | ORAL | Status: DC
  Administered 2011-12-19 – 2011-12-21 (×3): 40 mg via ORAL
  Filled 2011-12-19 (×3): qty 1

## 2011-12-19 MED ORDER — INSULIN GLARGINE 100 UNIT/ML ~~LOC~~ SOLN
35.0000 [IU] | Freq: Every day | SUBCUTANEOUS | Status: DC
  Administered 2011-12-19 – 2011-12-20 (×2): 35 [IU] via SUBCUTANEOUS

## 2011-12-19 MED ORDER — POTASSIUM CHLORIDE CRYS ER 20 MEQ PO TBCR
20.0000 meq | EXTENDED_RELEASE_TABLET | Freq: Every day | ORAL | Status: DC
  Administered 2011-12-19 – 2011-12-21 (×3): 20 meq via ORAL
  Filled 2011-12-19 (×3): qty 1

## 2011-12-19 MED ORDER — RIVAROXABAN 20 MG PO TABS
20.0000 mg | ORAL_TABLET | Freq: Every day | ORAL | Status: DC
  Administered 2011-12-19 – 2011-12-20 (×2): 20 mg via ORAL
  Filled 2011-12-19 (×2): qty 1

## 2011-12-19 MED ORDER — IPRATROPIUM BROMIDE 0.02 % IN SOLN
0.5000 mg | Freq: Once | RESPIRATORY_TRACT | Status: AC
  Administered 2011-12-19: 0.5 mg via RESPIRATORY_TRACT

## 2011-12-19 MED ORDER — VITAMIN D3 25 MCG (1000 UNIT) PO TABS
1000.0000 [IU] | ORAL_TABLET | Freq: Every day | ORAL | Status: DC
  Administered 2011-12-19 – 2011-12-21 (×3): 1000 [IU] via ORAL
  Filled 2011-12-19 (×3): qty 1

## 2011-12-19 MED ORDER — ALBUTEROL SULFATE (5 MG/ML) 0.5% IN NEBU
5.0000 mg | INHALATION_SOLUTION | Freq: Once | RESPIRATORY_TRACT | Status: AC
  Administered 2011-12-19: 5 mg via RESPIRATORY_TRACT

## 2011-12-19 MED ORDER — LINAGLIPTIN 5 MG PO TABS
5.0000 mg | ORAL_TABLET | Freq: Every day | ORAL | Status: DC
  Administered 2011-12-19 – 2011-12-21 (×3): 5 mg via ORAL
  Filled 2011-12-19 (×3): qty 1

## 2011-12-19 MED ORDER — ONDANSETRON HCL 4 MG/2ML IJ SOLN: 4.0000 mg | Freq: Four times a day (QID) | INTRAMUSCULAR | Status: DC | PRN

## 2011-12-19 MED ORDER — SODIUM CHLORIDE 0.9 % IV SOLN
Freq: Once | INTRAVENOUS | Status: AC
  Administered 2011-12-19: 05:00:00 via INTRAVENOUS

## 2011-12-19 MED ORDER — INSULIN GLARGINE 100 UNIT/ML ~~LOC~~ SOLN
30.0000 [IU] | Freq: Every day | SUBCUTANEOUS | Status: DC
  Administered 2011-12-19 – 2011-12-20 (×2): 30 [IU] via SUBCUTANEOUS

## 2011-12-19 MED ORDER — CALCIUM CARBONATE 1250 (500 CA) MG PO TABS
1.0000 | ORAL_TABLET | Freq: Every day | ORAL | Status: DC
  Administered 2011-12-19 – 2011-12-21 (×3): 500 mg via ORAL
  Filled 2011-12-19 (×4): qty 1

## 2011-12-19 MED ORDER — METFORMIN HCL 500 MG PO TABS
1000.0000 mg | ORAL_TABLET | Freq: Every day | ORAL | Status: DC
  Administered 2011-12-19 – 2011-12-21 (×3): 1000 mg via ORAL
  Filled 2011-12-19 (×4): qty 2

## 2011-12-19 MED ORDER — ALBUTEROL SULFATE HFA 108 (90 BASE) MCG/ACT IN AERS
2.0000 | INHALATION_SPRAY | Freq: Four times a day (QID) | RESPIRATORY_TRACT | Status: DC | PRN
  Filled 2011-12-19: qty 6.7

## 2011-12-19 MED ORDER — SODIUM CHLORIDE 0.9 % IJ SOLN
3.0000 mL | Freq: Two times a day (BID) | INTRAMUSCULAR | Status: DC
  Administered 2011-12-19 – 2011-12-21 (×4): 3 mL via INTRAVENOUS

## 2011-12-19 MED ORDER — NEBIVOLOL HCL 10 MG PO TABS
20.0000 mg | ORAL_TABLET | Freq: Every day | ORAL | Status: DC
  Administered 2011-12-19 – 2011-12-21 (×3): 20 mg via ORAL
  Filled 2011-12-19 (×3): qty 2

## 2011-12-19 MED ORDER — ACETAMINOPHEN 325 MG PO TABS: 650.0000 mg | ORAL_TABLET | ORAL | Status: DC | PRN

## 2011-12-19 MED ORDER — VITAMIN D 1000 UNITS PO CAPS: 1000.0000 [IU] | ORAL_CAPSULE | Freq: Every day | ORAL | Status: DC

## 2011-12-19 NOTE — ED Notes (Signed)
Pt calmer at present. Tolerating Bipap well

## 2011-12-19 NOTE — Progress Notes (Signed)
Patient taken off of BIPAP and placed on 2L [place].  Sats 100% HR 108 RR 22.  BBS clear and diminished in the bases.  Patient states that she is comfortable and her breathing feels normal to her.  RT will continue to monitor patient.  RN made aware.

## 2011-12-19 NOTE — ED Notes (Signed)
resp in pt to Bi PAP. Pt on BSC for BM and voiding

## 2011-12-19 NOTE — ED Provider Notes (Addendum)
History     CSN: [id number]  Arrival date & time 12/19/11  0423   First MD Initiated Contact with Patient 12/19/11 0424      Chief Complaint  Patient presents with  . Respiratory Distress    (Consider location/radiation/quality/duration/timing/severity/associated sxs/prior treatment) HPI Comments: Pt presents with acute Respiratory Distress - she states that last night at 11:00, she was watching the night time news and had no c/o - she then over the next couple of hours developed severe SOB which has been persistent, worse with laying down - not associated with a cough or fever.  She has mild leg swelling.  The sx are severe and she is unable to give history due to inability to speak 2/2 the severe SOB.  The MR shows that the pt does have Diastolic CHF as well as a long time hx of afib for which she is on xarelto and rate control with cardizem.    Level 5 caveat applies secondary to severe respiratory distress  The history is provided by the patient and medical records.    Past Medical History  Diagnosis Date  . Diabetes mellitus   . Asthma   . Hypertension   . Obesity   . Chronic diastolic congestive heart failure   . Carotid bruit   . Hypercholesterolemia   . Polyp of rectum   . Abnormal chest CT   . Persistent atrial fibrillation   . Bronchospasm 1998  . Atrial enlargement, left     severe    Past Surgical History  Procedure Date  . Cardiovascular stress test 09/2010  . Cardiac catheterization >5 years  . Cardioversion 03/25/2011    Procedure: CARDIOVERSION;  Surgeon: Corky Crafts, MD;  Location: [facility] OR;  Service: Cardiovascular;  Laterality: N/A;    Family History  Problem Relation Age of Onset  . Asthma Mother     History  Substance Use Topics  . Smoking status: Never Smoker   . Smokeless tobacco: Never Used  . Alcohol Use: No    OB History    Grav Para Term Preterm Abortions TAB SAB Ect Mult Living                  Review of Systems   Unable to perform ROS: Unstable vital signs    Allergies  Review of patient's allergies indicates no known allergies.  Home Medications   Current Outpatient Rx  Name  Route  Sig  Dispense  Refill  . ALBUTEROL SULFATE HFA 108 (90 BASE) MCG/ACT IN AERS   Inhalation   Inhale 2 puffs into the lungs every 6 (six) hours as needed. For shortness of breath         . CALCIUM CARBONATE 1250 MG PO TABS   Oral   Take 1 tablet by mouth daily.           [name] VITAMIN D 1000 UNITS PO CAPS   Oral   Take 1,000 Units by mouth daily.          [name] DILTIAZEM HCL ER BEADS 360 MG PO CP24   Oral   Take 1 capsule (360 mg total) by mouth daily.   30 capsule   11   . DIOVAN 320 MG PO TABS   Oral   Take 320 mg by mouth daily. Once daily         . FUROSEMIDE 40 MG PO TABS   Oral   Take 1 tablet (40 mg total) by mouth daily.  30 tablet   11   . INSULIN GLARGINE 100 UNIT/ML  SOLN   Subcutaneous   Inject 30-35 Units into the skin See admin instructions. Inject 30 units once daily and inject 35 units daily at bedtime.         [name] JANUMET 50-1000 MG PO TABS   Oral   Take 1 tablet by mouth daily.          [name] LIPITOR 40 MG PO TABS   Oral   Take 40 mg by mouth daily. Once daily         . NEBIVOLOL HCL 20 MG PO TABS   Oral   Take 2 tablets by mouth 2 (two) times daily. Take 2 by mouth daily         . FISH OIL 1000 MG PO CAPS   Oral   Take 1 capsule by mouth daily.          [name] POTASSIUM CHLORIDE CRYS ER 20 MEQ PO TBCR   Oral   Take 20 mEq by mouth daily.         [name] RIVAROXABAN 20 MG PO TABS   Oral   Take 1 tablet by mouth daily.            BP 115/77  Pulse 44  Temp 96.3 F (35.7 C) (Axillary)  Resp 23  SpO2 100%  Physical Exam  Nursing note and vitals reviewed. Constitutional:       Diaphoretic and in respiratory distress  HENT:       NCAT, OP clear, MMM  Eyes: Conjunctivae normal are normal. Right eye exhibits no discharge. Left eye exhibits no discharge. No  scleral icterus.  Neck:       Supple, no JVD (obese neck)  Cardiovascular:       afib with rvr, good pulses at radial arteries bilaterally, tachycardia  Pulmonary/Chest:       Diffuse rales, severe respiratory distress, speaks in 1-2 word sentences, increased WOB, accessory muscle use  Abdominal: Soft. Bowel sounds are normal. She exhibits no distension. There is no tenderness. There is no rebound.  Musculoskeletal: Normal range of motion. She exhibits edema ( 1+ pittind edema bialterall, no asymetry). She exhibits no tenderness.  Neurological: She is alert. Coordination normal.  Skin: Skin is warm. No rash noted. She is diaphoretic. No erythema.    ED Course  Procedures (including critical care time)  Labs Reviewed  APTT - Abnormal; Notable for the following:    aPTT 38 (*)     All other components within normal limits  PROTIME-INR - Abnormal; Notable for the following:    Prothrombin Time 17.3 (*)     All other components within normal limits  PRO B NATRIURETIC PEPTIDE - Abnormal; Notable for the following:    Pro B Natriuretic peptide (BNP) 1004.0 (*)     All other components within normal limits  POCT I-STAT, CHEM 8 - Abnormal; Notable for the following:    Glucose, Bld 319 (*)     Hemoglobin 15.3 (*)     All other components within normal limits  CBC WITH DIFFERENTIAL  POCT I-STAT TROPONIN I   Dg Chest Port 1 View  12/19/2011  *RADIOLOGY REPORT*  Clinical Data: Shortness of breath.  Respiratory distress.  PORTABLE CHEST - 1 VIEW  Comparison: 04/19/2011  Findings: Cardiac enlargement with bilateral perihilar and lower lung infiltration or edema.  No blunting of costophrenic angles. No pneumothorax.  Mediastinal contours appear intact.  Changes are progressing  since the previous study.  IMPRESSION: Cardiac enlargement with bilateral parenchymal infiltration or edema.   Original Report Authenticated By: Burman Nieves, M.D.      1. Respiratory distress   2. Congestive heart  failure   3.  Hyperglycemia     MDM  ED ECG REPORT  I personally interpreted this EKG   Date: 12/19/2011   Rate: 132  Rhythm: atrial fibrillation and with RVR  QRS Axis: normal  Intervals: normal  ST/T Wave abnormalities: nonspecific T wave changes  Conduction Disutrbances:none  Narrative Interpretation:   Old EKG Reviewed: c/w 03/25/11 NSR is replaced with afib with rvr  SEvere resp distress, requiring BiPAP secondary to her severe distress and hypoxia on nonrebreather. After BiPAP was initiated the patient had a significant improvement in her respiratory rate and effort, her pulse and her blood pressure which has come down to 136/84. Oxygen saturations are 100%.  Labs show a normal troponin, elevated BNP at 1000, normal blood counts and normal electrolytes and renal function. She is hyperglycemic at 319.  PA and lateral views of the chest were obtained by digital radiography. I have personally interpreted these x-rays and find her to be cardiomegaly with significant pulmonary edema bilaterally, no pneumothorax, no subdiaphragmatic air.  The patient will need to be admitted to the cardiology service. - discussed with Dr. Mayford Knife at [phone], waiting for oncoming team to admit  Pt much improved on last check at 0630.  CRITICAL CARE Performed by: Vida Roller   Total critical care time: 30  Critical care time was exclusive of separately billable procedures and treating other patients.  Critical care was necessary to treat or prevent imminent or life-threatening deterioration.  Critical care was time spent personally by me on the following activities: development of treatment plan with patient and/or surrogate as well as nursing, discussions with consultants, evaluation of patient's response to treatment, examination of patient, obtaining history from patient or surrogate, ordering and performing treatments and interventions, ordering and review of laboratory studies, ordering and  review of radiographic studies, pulse oximetry and re-evaluation of patient's condition.   Vida Roller, MD 12/19/11 1914  Vida Roller, MD 12/19/11 [phone]

## 2011-12-19 NOTE — ED Notes (Signed)
Pt had soft formed BM in BSC voided large amt in BSC. Assisted back to bed. Family members at bedside. Pt resting quietly. resp even and non labored. Bi Pap remains in place

## 2011-12-19 NOTE — H&P (Signed)
Admit date: 12/19/2011 Referring Physician  Dr. Hyacinth Meeker Primary Cardiologist  Dr. Eldridge Dace Chief complaint/reason for admission:  SOB  HPI: This is a 56yo AAF with a history of diastolic CHF, asthma, HTN, DM and chronic atrial fibrillation who was in her USOH until this am when she got up to go to work.  Around 3:30am when starting her car she started having severe SOB.  She denied any chest pain or nausea or diaphoresis.  She went on to work  but then became weak walking from her car and had to stop.  Her friend put her in her car and brought her to [facility] ER.  In ER she was given IV Lasix and started on BiPAP with significant improvement in symptoms.  Currently she is off BiPAP and breathing comfortably on O2.  She denies any LE edema.      PMH:    Past Medical History  Diagnosis Date  . Diabetes mellitus   . Asthma   . Hypertension   . Obesity   . Chronic diastolic congestive heart failure   . Carotid bruit   . Hypercholesterolemia   . Polyp of rectum   . Abnormal chest CT   . Persistent atrial fibrillation   . Bronchospasm 1998  . Atrial enlargement, left     severe    PSH:    Past Surgical History  Procedure Date  . Cardiovascular stress test 09/2010  . Cardiac catheterization >5 years  . Cardioversion 03/25/2011    Procedure: CARDIOVERSION;  Surgeon: Corky Crafts, MD;  Location: [facility] OR;  Service: Cardiovascular;  Laterality: N/A;    ALLERGIES:   Review of patient's allergies indicates no known allergies.  Prior to Admit Meds:   (Not in a hospital admission) Family HX:    Family History  Problem Relation Age of Onset  . Asthma Mother    Social HX:    History   Social History  . Marital Status: Married    Spouse Name: N/A    Number of Children: N/A  . 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 Smoking status: Never Smoker   . Smokeless tobacco: Never Used  . Alcohol Use: No  .  Drug Use: No  . Sexually Active:    Other Topics Concern  . Not on file   Social History Narrative   Pt lives in [place] with spouse.  Works at [employer]. 2 grown children, 1 grandchild     ROS:  All 11 ROS were addressed and are negative except what is stated in the HPI  PHYSICAL EXAM Filed Vitals:   12/19/11 0700  BP: 115/77  Pulse: 67  Temp:   Resp: 23   General: Well developed, well nourished, in no acute distress Head: Eyes PERRLA, No xanthomas.   Normal cephalic and atramatic  Lungs:   Crackles at bases bilaterally Heart:   HRRR S1 S2 Pulses are 2+ & equal.            No carotid bruit. No JVD.  No abdominal bruits. No femoral bruits. Abdomen: Bowel sounds are positive, abdomen soft and non-tender without masses Extremities:   No clubbing, cyanosis or edema.  DP +1 Neuro: Alert and oriented X 3. Psych:  Good affect, responds appropriately   Labs:   Lab Results  Component Value Date   WBC 7.2 12/19/2011   HGB 15.3* 12/19/2011   HCT 45.0  12/19/2011   MCV 94.9 12/19/2011   PLT 158 12/19/2011    Lab 12/19/11 0440  NA 143  K 4.0  CL 106  CO2 --  BUN 16  CREATININE 0.80  CALCIUM --  PROT --  BILITOT --  ALKPHOS --  ALT --  AST --  GLUCOSE 319*   Lab Results  Component Value Date   CKTOTAL 87 01/06/2011   CKMB 2.8 01/06/2011   TROPONINI <0.30 01/06/2011   No results found for this basename: PTT   Lab Results  Component Value Date   INR 1.46 12/19/2011   INR 1.27 01/05/2011   INR 1.15 11/24/2010     Lab Results  Component Value Date   CHOL 133 09/18/2010   Lab Results  Component Value Date   HDL 25* 09/18/2010   Lab Results  Component Value Date   LDLCALC 88 09/18/2010   Lab Results  Component Value Date   TRIG 100 09/18/2010   Lab Results  Component Value Date   CHOLHDL 5.3 09/18/2010   No results found for this basename: LDLDIRECT      Radiology:  *RADIOLOGY REPORT*  Clinical Data: Shortness of breath. Respiratory  distress.  PORTABLE CHEST - 1 VIEW  Comparison: 04/19/2011  Findings: Cardiac enlargement with bilateral perihilar and lower  lung infiltration or edema. No blunting of costophrenic angles.  No pneumothorax. Mediastinal contours appear intact. Changes are  progressing since the previous study.  IMPRESSION:  Cardiac enlargement with bilateral parenchymal infiltration or  edema.  Original Report Authenticated By: Burman Nieves, M.D.   EKG:  Atrial fibrillation with RVR in 130's  ASSESSMENT:  1.  Acute on chronic diastolic CHF 2.  Chronic Atrial fibrillation with RVR now rate controlled 3.  DM 4.  HTN 5.  Obesity 6.  Remote diagnosis on OSA but not on CPAP  PLAN:   1.  Admit to tele bed 2.  IV Lasix 3.  Follow I&O's and weights daily 4.  Continue current home meds 5.  Needs to get on CPAP - she never started it  Quintella Reichert, MD  12/19/2011  8:41 AM

## 2011-12-19 NOTE — ED Notes (Signed)
Pt states she woke up with severe shob. Pt pale and sweaty on arrival to ED with room air sat of 88%,

## 2011-12-19 NOTE — ED Notes (Signed)
NS infusing a KVO

## 2011-12-19 NOTE — ED Notes (Signed)
Pt sitting on BSC. States she wants to stay there because she thinks she has to void again.  Pt tolerating Bipap well. Family outside door

## 2011-12-19 NOTE — ED Notes (Signed)
Multiple family members at bedside

## 2011-12-19 NOTE — Progress Notes (Signed)
  Echocardiogram 2D Echocardiogram has been performed.  [name] FRANCES 12/19/2011, 12:25 PM

## 2011-12-19 NOTE — ED Notes (Signed)
Patient removed from bipap per RT.  Patient tolerating well, O2 sats 100 %

## 2011-12-20 ENCOUNTER — Encounter (HOSPITAL_COMMUNITY): Payer: Self-pay | Admitting: General Practice

## 2011-12-20 DIAGNOSIS — I340 Nonrheumatic mitral (valve) insufficiency: Secondary | ICD-10-CM | POA: Insufficient documentation

## 2011-12-20 LAB — GLUCOSE, CAPILLARY
Glucose-Capillary: 135 mg/dL — ABNORMAL HIGH (ref 70–99)
Glucose-Capillary: 126 mg/dL — ABNORMAL HIGH (ref 70–99)
Glucose-Capillary: 165 mg/dL — ABNORMAL HIGH (ref 70–99)

## 2011-12-20 LAB — BASIC METABOLIC PANEL
BUN: 13 mg/dL (ref 6–23)
Chloride: 106 mEq/L (ref 96–112)
Creatinine, Ser: 0.81 mg/dL (ref 0.50–1.10)
GFR calc Af Amer: >90 mL/min (ref >90)
Glucose, Bld: 140 mg/dL — ABNORMAL HIGH (ref 70–99)
Potassium: 4.1 mEq/L (ref 3.5–5.1)

## 2011-12-20 LAB — TROPONIN I: Troponin I: <0.3 ng/mL (ref <0.30)

## 2011-12-20 MED ORDER — RIVAROXABAN 20 MG PO TABS
20.0000 mg | ORAL_TABLET | Freq: Every day | ORAL | Status: DC
  Administered 2011-12-21: 20 mg via ORAL
  Filled 2011-12-20 (×2): qty 1

## 2011-12-20 MED ORDER — SODIUM CHLORIDE 0.9 % IV SOLN: INTRAVENOUS | Status: DC

## 2011-12-20 NOTE — Progress Notes (Signed)
SUBJECTIVE: Breathing better  OBJECTIVE:   Vitals:   Filed Vitals:   12/19/11 2124 12/20/11 0549 12/20/11 1029 12/20/11 1400  BP: 117/81 103/69 128/82 129/81  Pulse: 82 67  78  Temp: 97.5 F (36.4 C) 98 F (36.7 C)  98.2 F (36.8 C)  TempSrc: Oral Oral  Oral  Resp: 20 18  18   Height:      Weight:  106.414 kg (234 lb 9.6 oz)    SpO2: 98% 97%  100%   I&O's:   Intake/Output Summary (Last 24 hours) at 12/20/11 1859 Last data filed at 12/20/11 1300  Gross per 24 hour  Intake    683 ml  Output   1400 ml  Net   -717 ml   TELEMETRY: Reviewed telemetry pt in AFib     PHYSICAL EXAM General: Well developed, well nourished, in no acute distress Head: Eyes PERRLA, No xanthomas.   Normal cephalic and atramatic  Lungs:  Basilar crackles. Heart:   HRRR S1 S2 , 2/6 murmur systolic Abdomen: Bowel sounds are positive, abdomen soft and non-tender Msk:  . Normal strength and tone for age. Extremities:   No  edema.  DP +1 Neuro: Alert and oriented X 3. Psych:  Normal affect, responds appropriately   LABS: Basic Metabolic Panel:  Basename 12/20/11 0542 12/19/11 1143  NA 142 141  K 4.1 3.8  CL 106 102  CO2 30 28  GLUCOSE 140* 266*  BUN 13 12  CREATININE 0.81 0.66  CALCIUM 9.3 9.2  MG -- --  PHOS -- --   Liver Function Tests:  Basename 12/19/11 1143  AST 20  ALT 16  ALKPHOS 121*  BILITOT 0.7  PROT 7.7  ALBUMIN 3.6   No results found for this basename: LIPASE:2,AMYLASE:2 in the last 72 hours CBC:  Basename 12/19/11 1143 12/19/11 0440 12/19/11 0431  WBC 6.5 -- 7.2  NEUTROABS 4.6 -- 4.1  HGB 12.6 15.3* --  HCT 39.2 45.0 --  MCV 93.3 -- 94.9  PLT 146* -- 158   Cardiac Enzymes:  Basename 12/19/11 2318 12/19/11 1604 12/19/11 1145  CKTOTAL -- -- --  CKMB -- -- --  CKMBINDEX -- -- --  TROPONINI <0.30 <0.30 <0.30   BNP: No components found with this basename: POCBNP:3 D-Dimer: No results found for this basename: DDIMER:2 in the last 72 hours Hemoglobin  A1C: No results found for this basename: HGBA1C in the last 72 hours Fasting Lipid Panel: No results found for this basename: CHOL,HDL,LDLCALC,TRIG,CHOLHDL,LDLDIRECT in the last 72 hours Thyroid Function Tests:  Basename 12/19/11 1143  TSH 2.479  T4TOTAL --  T3FREE --  THYROIDAB --   Anemia Panel: No results found for this basename: VITAMINB12,FOLATE,FERRITIN,TIBC,IRON,RETICCTPCT in the last 72 hours Coag Panel:   Lab Results  Component Value Date   INR 1.46 12/19/2011   INR 1.27 01/05/2011   INR 1.15 11/24/2010    RADIOLOGY: Dg Chest Port 1 View  12/19/2011  *RADIOLOGY REPORT*  Clinical Data: Shortness of breath.  Respiratory distress.  PORTABLE CHEST - 1 VIEW  Comparison: 04/19/2011  Findings: Cardiac enlargement with bilateral perihilar and lower lung infiltration or edema.  No blunting of costophrenic angles. No pneumothorax.  Mediastinal contours appear intact.  Changes are progressing since the previous study.  IMPRESSION: Cardiac enlargement with bilateral parenchymal infiltration or edema.   Original Report Authenticated By: Burman Nieves, M.D.       ASSESSMENT: Significant MR, Diastolic dysfunction, AFib  PLAN:  Likely cause of pulmonary edema is MR in  combination with AFib.  Echo shows moderate to severe MR.  Will plan for TEE. Procedure explained. Questions answered.    COntinue Lasix.  Check BMet.  Rate control AFib.  Xarelto for stroke prevention.  Corky Crafts., MD  12/20/2011  6:59 PM

## 2011-12-20 NOTE — Progress Notes (Signed)
Inpatient Diabetes Program Recommendations  AACE/ADA: New Consensus Statement on Inpatient Glycemic Control (2013)  Target Ranges:  Prepandial:   less than 140 mg/dL      Peak postprandial:   less than 180 mg/dL (1-2 hours)      Critically ill patients:  140 - 180 mg/dL   Reason for Visit: Elevated blood sugars  Results for SALLE, BRANDLE (MRN [id number]) as of 12/20/2011 16:11  Ref. Range 01/07/2011 04:26 01/07/2011 07:23 03/25/2011 10:35 12/19/2011 10:42 12/19/2011 17:11 12/19/2011 21:22 12/20/2011 07:43 12/20/2011 11:16  Glucose-Capillary Latest Range: 70-99 mg/dL 119 (H) 147 (H) 829 (H) 238 (H) 182 (H) 246 (H) 135 (H) 126 (H)  Results for ELODIE, PANAMENO (MRN [id number]) as of 12/20/2011 16:11  Ref. Range 01/07/2011 05:05  Hemoglobin A1C Latest Range: <5.7 % 12.4 (H)    Inpatient Diabetes Program Recommendations HgbA1C: Check HgbA1C to assess glycemic control prior to hospitalization  Note: Will follow.

## 2011-12-20 NOTE — Plan of Care (Signed)
Problem: Phase II Progression Outcomes Goal: Begin discharge teaching Outcome: Completed/Met Date Met:  12/20/11 Discussed weighing every morning and fluid restriction.

## 2011-12-21 ENCOUNTER — Encounter (HOSPITAL_COMMUNITY)
Admission: EM | Disposition: A | Discharge status: Home / Self Care | Payer: Self-pay | Source: Home / Self Care | Attending: Interventional Cardiology

## 2011-12-21 ENCOUNTER — Encounter (HOSPITAL_COMMUNITY): Payer: Self-pay

## 2011-12-21 HISTORY — PX: TEE WITHOUT CARDIOVERSION: SHX5443

## 2011-12-21 LAB — GLUCOSE, CAPILLARY: Glucose-Capillary: 62 mg/dL — ABNORMAL LOW (ref 70–99)

## 2011-12-21 LAB — BASIC METABOLIC PANEL
BUN: 15 mg/dL (ref 6–23)
CO2: 31 mEq/L (ref 19–32)
Calcium: 9.4 mg/dL (ref 8.4–10.5)
Chloride: 105 mEq/L (ref 96–112)
Creatinine, Ser: 0.85 mg/dL (ref 0.50–1.10)
GFR calc Af Amer: 86 mL/min — ABNORMAL LOW (ref >90)

## 2011-12-21 SURGERY — ECHOCARDIOGRAM, TRANSESOPHAGEAL
Anesthesia: Moderate Sedation

## 2011-12-21 MED ORDER — MIDAZOLAM HCL 10 MG/2ML IJ SOLN
INTRAMUSCULAR | Status: DC | PRN
  Administered 2011-12-21 (×2): 2 mg via INTRAVENOUS

## 2011-12-21 MED ORDER — FENTANYL CITRATE 0.05 MG/ML IJ SOLN
INTRAMUSCULAR | Status: AC
  Filled 2011-12-21: qty 2

## 2011-12-21 MED ORDER — FUROSEMIDE 40 MG PO TABS: 40.0000 mg | ORAL_TABLET | Freq: Two times a day (BID) | ORAL | Status: DC

## 2011-12-21 MED ORDER — FUROSEMIDE 40 MG PO TABS
40.0000 mg | ORAL_TABLET | Freq: Two times a day (BID) | ORAL | Status: DC
  Filled 2011-12-21 (×2): qty 1

## 2011-12-21 MED ORDER — DIPHENHYDRAMINE HCL 50 MG/ML IJ SOLN
INTRAMUSCULAR | Status: AC
  Filled 2011-12-21: qty 1

## 2011-12-21 MED ORDER — FENTANYL CITRATE 0.05 MG/ML IJ SOLN
INTRAMUSCULAR | Status: DC | PRN
  Administered 2011-12-21 (×3): 25 ug via INTRAVENOUS

## 2011-12-21 MED ORDER — LIDOCAINE VISCOUS 2 % MT SOLN
OROMUCOSAL | Status: AC
  Filled 2011-12-21: qty 15

## 2011-12-21 MED ORDER — POTASSIUM CHLORIDE CRYS ER 20 MEQ PO TBCR: 20.0000 meq | EXTENDED_RELEASE_TABLET | Freq: Every day | ORAL | Status: DC

## 2011-12-21 MED ORDER — SODIUM CHLORIDE 0.9 % IV SOLN
INTRAVENOUS | Status: DC
  Administered 2011-12-21: 15:00:00 via INTRAVENOUS

## 2011-12-21 MED ORDER — MIDAZOLAM HCL 5 MG/ML IJ SOLN
INTRAMUSCULAR | Status: AC
  Filled 2011-12-21: qty 3

## 2011-12-21 MED ORDER — DEXTROSE 50 % IV SOLN
INTRAVENOUS | Status: AC
  Administered 2011-12-21: 50 mL
  Filled 2011-12-21: qty 50

## 2011-12-21 NOTE — Discharge Summary (Signed)
Patient ID: [name] MRN: [id number] DOB/AGE: [birth year] 56 y.o.  Admit date: 12/19/2011 Discharge date: 12/21/2011  Primary Discharge Diagnosis Acute diastolic heart failure Secondary Discharge Diagnosis Severe mitral regurgitation, atrial fibrillation, hypertension  Significant Diagnostic Studies: TEE on 12/21/11 showing severe mitral regurgitation, normal LV function, moderate TR, mild AI  Consults: None  Hospital Course: 56 year old who is admitted with flash pulmonary edema.  She has had an episode of this in the past and was thought to be due to atrial fibrillation and diastolic dysfunction.  Repeat echocardiogram revealed significant mitral regurgitation.  She was diuresed.  A transesophageal echo was performed revealing severe mitral regurgitation with some pulmonary vein flow reversal during systole.  Her heart rate was controlled.  She was weaned from oxygen.  She felt well and strongly requested to go home on December 11.  Given the findings from the TEE, we have outlined a plan including outpatient cardiac catheterization next week.  She will hold her anticoagulation for a few days prior to this.  We've increased her home dose of Lasix from 40 mg daily to 40 mg twice a day.  Continue rate control medicine.  Continue anticoagulation for stroke prevention.  We will obtain a consultation from Dr. Cornelius Moras regarding possible mitral valve repair and maze procedure.  She will contact our office if she has any further symptoms.  Our office will contact her to schedule the cath and for followup lab work.  Discharge Exam: Blood pressure 104/69, pulse 71, temperature 97.8 F (36.6 C), temperature source Oral, resp. rate 19, height 5\' 6"  (1.676 m), weight 105.235 kg (232 lb), SpO2 96.00%.  Labs:   Lab Results  Component Value Date   WBC 6.5 12/19/2011   HGB 12.6 12/19/2011   HCT 39.2 12/19/2011   MCV 93.3 12/19/2011   PLT 146* 12/19/2011    Lab 12/21/11 0647 12/19/11 1143  NA 142 --  K  3.9 --  CL 105 --  CO2 31 --  BUN 15 --  CREATININE 0.85 --  CALCIUM 9.4 --  PROT -- 7.7  BILITOT -- 0.7  ALKPHOS -- 121*  ALT -- 16  AST -- 20  GLUCOSE 106* --   Lab Results  Component Value Date   CKTOTAL 87 01/06/2011   CKMB 2.8 01/06/2011   TROPONINI <0.30 12/19/2011    Lab Results  Component Value Date   CHOL 133 09/18/2010   Lab Results  Component Value Date   HDL 25* 09/18/2010   Lab Results  Component Value Date   LDLCALC 88 09/18/2010   Lab Results  Component Value Date   TRIG 100 09/18/2010   Lab Results  Component Value Date   CHOLHDL 5.3 09/18/2010   No results found for this basename: LDLDIRECT      Radiology: bilateral pulmonary edema EKG: AFib with RVR  FOLLOW UP PLANS AND APPOINTMENTS    Medication List     As of 12/21/2011  5:01 PM    TAKE these medications         calcium carbonate 1250 MG tablet   Commonly known as: OS-CAL - dosed in mg of elemental calcium   Take 1 tablet by mouth daily.      diltiazem 360 MG 24 hr capsule   Commonly known as: TIAZAC   Take 1 capsule (360 mg total) by mouth daily.      DIOVAN 320 MG tablet   Generic drug: valsartan   Take 320 mg by mouth daily. Once daily  Fish Oil 1000 MG Caps   Take 1 capsule by mouth daily.      furosemide 40 MG tablet   Commonly known as: LASIX   Take 1 tablet (40 mg total) by mouth 2 (two) times daily.      JANUMET 50-1000 MG per tablet   Generic drug: sitaGLIPtan-metformin   Take 1 tablet by mouth daily.      LANTUS SOLOSTAR 100 UNIT/ML injection   Generic drug: insulin glargine   Inject 30-35 Units into the skin See admin instructions. Inject 30 units once daily and inject 35 units daily at bedtime.      LIPITOR 40 MG tablet   Generic drug: atorvastatin   Take 40 mg by mouth daily. Once daily      Nebivolol HCl 20 MG Tabs   Take 1 tablet by mouth daily. Take 2 by mouth daily      potassium chloride SA 20 MEQ tablet   Commonly known as: K-DUR,KLOR-CON   Take  1 tablet (20 mEq total) by mouth daily.      Rivaroxaban 20 MG Tabs   Commonly known as: XARELTO   Take 1 tablet by mouth daily.      VENTOLIN HFA 108 (90 BASE) MCG/ACT inhaler   Generic drug: albuterol   Inhale 2 puffs into the lungs every 6 (six) hours as needed. For shortness of breath      Vitamin D 1000 UNITS capsule   Take 1,000 Units by mouth daily.           Follow-up Information    Follow up with Corky Crafts., MD. In 5 days. (for labs)    Contact information:   [address] [place] [address] [phone]          BRING ALL MEDICATIONS WITH YOU TO FOLLOW UP APPOINTMENTS  Time spent with patient to include physician time:35 minutes going over test results, plan for cath and likely mitral valve repair in the near future. SignedCorky Crafts. 12/21/2011, 5:01 PM

## 2011-12-21 NOTE — CV Procedure (Signed)
TEE performed without complications.  Normal LV function.  Anterior leaflet MVP.  Severe mitral regurgitation, posteriorly directed with systolic flow reversal in the LUPV.  Moderate tricuspid regurgitation.  Mild aortic insufficiency.  Prominent eustacian valve.  Abdominal aorta not well seen.    Will need cath and consultation with surgeon regarding mitral valve repair.

## 2011-12-21 NOTE — Progress Notes (Signed)
SUBJECTIVE: Breathing better. No longer requiring oxygen  OBJECTIVE:   Vitals:   Filed Vitals:   12/20/11 1029 12/20/11 1400 12/20/11 2138 12/21/11 0554  BP: 128/82 129/81 114/78 121/87  Pulse:  78 63 64  Temp:  98.2 F (36.8 C) 97.4 F (36.3 C) 97.8 F (36.6 C)  TempSrc:  Oral Oral Oral  Resp:  18 18 18   Height:      Weight:    105.235 kg (232 lb)  SpO2:  100% 98% 98%   I&O's:    Intake/Output Summary (Last 24 hours) at 12/21/11 1013 Last data filed at 12/20/11 1900  Gross per 24 hour  Intake    680 ml  Output   1700 ml  Net  -1020 ml   TELEMETRY: Reviewed telemetry pt in AFib, 2.6 second pause noted.     PHYSICAL EXAM General: Well developed, well nourished, in no acute distress Head: Eyes PERRLA, No xanthomas.   Normal cephalic and atramatic  Lungs:  Basilar crackles. Heart:   HRRR S1 S2 , 2/6 murmur systolic Abdomen: Bowel sounds are positive, abdomen soft and non-tender Msk:  . Normal strength and tone for age. Extremities:   No  edema.  DP +1 Neuro: Alert and oriented X 3. Psych:  Normal affect, responds appropriately   LABS: Basic Metabolic Panel:  Basename 12/21/11 0647 12/20/11 0542  NA 142 142  K 3.9 4.1  CL 105 106  CO2 31 30  GLUCOSE 106* 140*  BUN 15 13  CREATININE 0.85 0.81  CALCIUM 9.4 9.3  MG -- --  PHOS -- --   Liver Function Tests:  Basename 12/19/11 1143  AST 20  ALT 16  ALKPHOS 121*  BILITOT 0.7  PROT 7.7  ALBUMIN 3.6   No results found for this basename: LIPASE:2,AMYLASE:2 in the last 72 hours CBC:  Basename 12/19/11 1143 12/19/11 0440 12/19/11 0431  WBC 6.5 -- 7.2  NEUTROABS 4.6 -- 4.1  HGB 12.6 15.3* --  HCT 39.2 45.0 --  MCV 93.3 -- 94.9  PLT 146* -- 158   Cardiac Enzymes:  Basename 12/19/11 2318 12/19/11 1604 12/19/11 1145  CKTOTAL -- -- --  CKMB -- -- --  CKMBINDEX -- -- --  TROPONINI <0.30 <0.30 <0.30   BNP: No components found with this basename: POCBNP:3 D-Dimer: No results found for this basename:  DDIMER:2 in the last 72 hours Hemoglobin A1C: No results found for this basename: HGBA1C in the last 72 hours Fasting Lipid Panel: No results found for this basename: CHOL,HDL,LDLCALC,TRIG,CHOLHDL,LDLDIRECT in the last 72 hours Thyroid Function Tests:  Basename 12/19/11 1143  TSH 2.479  T4TOTAL --  T3FREE --  THYROIDAB --   Anemia Panel: No results found for this basename: VITAMINB12,FOLATE,FERRITIN,TIBC,IRON,RETICCTPCT in the last 72 hours Coag Panel:   Lab Results  Component Value Date   INR 1.46 12/19/2011   INR 1.27 01/05/2011   INR 1.15 11/24/2010    RADIOLOGY: Dg Chest Port 1 View  12/19/2011  *RADIOLOGY REPORT*  Clinical Data: Shortness of breath.  Respiratory distress.  PORTABLE CHEST - 1 VIEW  Comparison: 04/19/2011  Findings: Cardiac enlargement with bilateral perihilar and lower lung infiltration or edema.  No blunting of costophrenic angles. No pneumothorax.  Mediastinal contours appear intact.  Changes are progressing since the previous study.  IMPRESSION: Cardiac enlargement with bilateral parenchymal infiltration or edema.   Original Report Authenticated By: Burman Nieves, M.D.       ASSESSMENT: Significant MR, Diastolic dysfunction, AFib  PLAN:  Likely cause  of pulmonary edema is MR in combination with AFib.  Echo shows moderate to severe MR.  Will plan for TEE today.    COntinue Lasix.  Check BMet.  Rate control AFib.  Xarelto for stroke prevention.  She would like to go home after the TEE.  Corky Crafts., MD  12/21/2011  10:13 AM

## 2011-12-21 NOTE — Progress Notes (Signed)
*  PRELIMINARY RESULTS* Echocardiogram Echocardiogram Transesophageal has been performed.  [name] 12/21/2011, 3:48 PM

## 2011-12-22 ENCOUNTER — Encounter (HOSPITAL_COMMUNITY): Payer: Self-pay | Admitting: Interventional Cardiology

## 2011-12-27 ENCOUNTER — Other Ambulatory Visit: Payer: Self-pay | Admitting: Interventional Cardiology

## 2011-12-28 ENCOUNTER — Other Ambulatory Visit: Payer: Self-pay | Admitting: Cardiology

## 2011-12-28 NOTE — H&P (Signed)
PRE CATH WORK UP/JV/MV REPAIR/LABS/SEE LINDA.       HPI:  General:  [name] is a 56 yo female followed by Dr Eldridge Dace with hx of Atrial fibrillation since 2012 and had cardioversion but return of the AFib.She also has hx of diastolic CHF with recent admissiuoin 12/19/11 due to acute heart failure/ pulmonary edema, Atrial fibrillation and noted severe Mitral regurgitation identified by TEE with normal LV function, moderate TR, She was diuresed. DR Eldridge Dace plans to proceed with cardiac cath later this week with possible mitral valve repair by Dr Cornelius Moras and MAZE procedure. She states she is feeling well at this time without SOB/PND, no chest pain, dizziness, syncope nor palpitaitons. Swellling is mild and unchanged. SHe denies any missed medications..        ROS:  as noted in HPI she denies any complaints, no GI problems no signs of bleeding, no fever, chills nor congestion, no neurological changes,.       Medical History: HTN, Murmur, Carotid Bruits, ADOM - 05/2005, Obesity, Rectal polyps, High Cholesterol, A fib (2012), Chronic Diastolic Heart Failure, severe OSA with AHI 71/hr now on BiPAP at 23/19cm H2O.        Surgical History: BTL 1997, colonoscopy with polyp resection 10/2005 .        Hospitalization/Major Diagnostic Procedure: None .        Family History: Father: deceased 52 yrs Mother: NiDDM, Elevated BP  Negative from GI standpoint.       Social History:  General: History of smoking cigarettes: Never smoked. no Alcohol. Exercise: nothing structured. Occupation: employed, [employer]. Marital Status: married.        Medications: Ventolin HFA 108 (90 Base) MCG/ACT Aerosol Solution 2 puffs as needed every 4 hrs, Vitamin D Capsule 1 capsule Once a day, One touch ultra test strips . strips use to check your blood sugar before your meal once a day, OneTouch Delica Lancets Delica Lancets as directed once a day, BD Pen Needle Nano U/F 32G X 4 MM Miscellaneous as directed with  Lantus SoloStar once a day, Lasix 40 MG Tablet 1 tablet BID, Potassium Chloride 20 MEQ Capsule Extended Release 1 capsule Once a day, Fish Oil 1000MG  Capsule 1 tablet once a day, Lipitor 40 MG Tablet 1 tablet Once a day, Diltiazem HCl ER Beads 360 mg Capsule Extended Release 24 Hour 1 capsule Once a day, Janumet 50-1000 MG Tablet 1 tablet with meals Once a day, Lantus SoloStar 100 UNIT/ML Solution 30 in am and 35 at night twice a day, Diovan 320 MG Tablet 1 tablet Once a day, Bystolic 20 MG Tablet 1 tablet Once a day, Xarelto 20 mg . tablet 1 tablet once a day, Oscal 500/200 D-3 500-200 MG-UNIT Tablet 1 tablet with food Once a day, Medication List reviewed and reconciled with the patient       Allergies: N.K.D.A.      Objective:     Vitals: Wt 238.4, Wt change 14.4 lb, Ht 66, BMI 38.47, Pulse sitting 76, BP sitting 110/80.       Examination:  General Examination:  GENERAL APPEARANCE alert, oriented, NAD, pleasant. NECK: soft bilateral bruits. LUNGS: clear to auscultation bilaterally, no wheezes, rhonchi, rales. HEART: no murmurs, irregularly irregular rhythm, regular rate,, normal S1S2. ABDOMEN: soft and not tender, non-distended, + bowel sounds. EXTREMITIES: bilateral 1+ pitting edema. NEUROLOGIC EXAM: non-focal exam. PSYCH appropriate mood and affect .  records and labs from admission 12/19/11 reviewed.       Assessment:  Assessment:  1. Mitral Regurgitation - 424.0 (Primary)  2. CHF - 428.0  3. Atrial fibrillation - 427.31  4. Hypertension - 401.9    Plan:     1. Mitral Regurgitation  LAB: Basic Metabolic    GLUCOSE 210 70-99 - mg/dL H   BUN 21 1-61 - mg/dL    CREATININE 0.96 0.45-4.09 - mg/dl    eGFR (NON-AFRICAN AMERICAN) 52 >60 - calc L   eGFR (AFRICAN AMERICAN) 63 >60 - calc    SODIUM 140 136-145 - mmol/L    POTASSIUM 4.1 3.5-5.5 - mmol/L    CHLORIDE 103 98-107 - mmol/L    C02 30 22-32 - mmol/L    ANION GAP 10.7 6.0-20.0 - mmol/L    CALCIUM 9.4 8.6-10.3 - mg/dL      Antonio Woodhams A 12/26/2011 04:57:27 PM > stable for cath, McVey,Linda 12/26/2011 05:11:17 PM >    LAB: CBC with Diff    WBC 7.8 4.0-11.0 - K/ul    RBC 3.85 4.20-5.40 - M/uL L   HGB 12.4 12.0-16.0 - g/dL    HCT 81.1 91.4-78.2 - % L   MCH 32.2 27.0-33.0 - pg    MPV 9.4 7.5-10.7 - fL    MCV 95.4 81.0-99.0 - fL    MCHC 33.8 32.0-36.0 - g/dL    RDW 95.6 21.3-08.6 - %    NRBC# 0.01 -    PLT 173 150-400 - K/uL    NEUT % 53.1 43.3-71.9 - %    NRBC% 0.10 - %    LYMPH% 33.8 16.8-43.5 - %    MONO % 9.9 4.6-12.4 - %    EOS % 2.8 0.0-7.8 - %    BASO % 0.4 0.0-1.0 - %    NEUT # 4.2 1.9-7.2 - K/uL    LYMPH# 2.70 1.10-2.70 - K/uL    MONO # 0.8 0.3-0.8 - K/uL    EOS # 0.2 0.0-0.6 - K/uL    BASO # 0.0 0.0-0.1 - K/uL     Roshonda Sperl A 12/26/2011 05:00:07 PM > ok     LAB: PT (Prothrombin Time) ([id number])     Prothrombin Time 16.2 9.1-12.0 - SEC H    INR 1.5 0.8-1.2 - H     Damonte Frieson A 12/27/2011 12:10:26 PM > ok, pt is to hold Xarelto for 3 days prior to cardiac cath.       2. CHF Continue Lasix Tablet, 40 MG, 1 tablet, Orally, BID ; Continue Potassium Chloride Capsule Extended Release, 20 MEQ, 1 capsule, Orally (new rx 01/07/11), Once a day ; Continue Diovan Tablet, 320 MG, 1 tablet, Orally, Once a day .       3. Atrial fibrillation Continue Diltiazem HCl ER Beads Capsule Extended Release 24 Hour, 360 mg, 1 capsule, Orally (New rx 12/28/10), Once a day ; Continue Xarelto 20 mg tablet, ., 1 tablet, orally, once a day to hold after tonight (12/16/11) for cardiac catheterization .  Risks and benefits of cardiac catheterization have been reviewed including risk of stroke, heart attack, death, bleeding, renal impariment and arterial damage. There was ample oppurtuny to answer questions. Alternatives were discussed. Patient understands and wishes to proceed. pt will not take Xarelto after tonight until after heart cath this week, then restart as Dr Eldridge Dace directs.       4. Hypertension  Continue Bystolic Tablet, 20 MG, 1 tablet, Orally, Once a day .        Procedures:  Venipuncture:  Venipuncture: Smith,Michele 12/26/2011 04:52:58 PM > , performed in  right arm.        Immunizations:        Labs:        Procedure Codes: 11914 ECL BMP, 85025 ECL CBC PLATELET DIFF, 78295 BLOOD COLLECTION ROUTINE VENIPUNCTURE       Preventive:         Follow Up: JV pending cath (Reason: Mitralk regurg, CHF)      Provider: Michaell Cowing. Emelda Fear, NP  Patient: [name], [name] DOB: [birth year] Date: 12/26/2011

## 2011-12-30 ENCOUNTER — Inpatient Hospital Stay (HOSPITAL_BASED_OUTPATIENT_CLINIC_OR_DEPARTMENT_OTHER)
Admission: RE | Admit: 2011-12-30 | Discharge: 2011-12-30 | Disposition: A | Discharge status: Home / Self Care | Payer: BC Managed Care – PPO | Source: Ambulatory Visit | Attending: Interventional Cardiology | Admitting: Interventional Cardiology

## 2011-12-30 ENCOUNTER — Encounter (HOSPITAL_BASED_OUTPATIENT_CLINIC_OR_DEPARTMENT_OTHER)
Admission: RE | Disposition: A | Discharge status: Home / Self Care | Payer: Self-pay | Source: Ambulatory Visit | Attending: Interventional Cardiology

## 2011-12-30 ENCOUNTER — Encounter (HOSPITAL_BASED_OUTPATIENT_CLINIC_OR_DEPARTMENT_OTHER): Payer: Self-pay | Admitting: Interventional Cardiology

## 2011-12-30 DIAGNOSIS — I2789 Other specified pulmonary heart diseases: Secondary | ICD-10-CM | POA: Insufficient documentation

## 2011-12-30 DIAGNOSIS — I10 Essential (primary) hypertension: Secondary | ICD-10-CM

## 2011-12-30 DIAGNOSIS — I340 Nonrheumatic mitral (valve) insufficiency: Secondary | ICD-10-CM

## 2011-12-30 DIAGNOSIS — Z01810 Encounter for preprocedural cardiovascular examination: Secondary | ICD-10-CM

## 2011-12-30 LAB — POCT I-STAT 3, VENOUS BLOOD GAS (G3P V)
Acid-base deficit: 1 mmol/L (ref 0.0–2.0)
Bicarbonate: 26 mEq/L — ABNORMAL HIGH (ref 20.0–24.0)
O2 Saturation: 43 %
O2 Saturation: 45 %
TCO2: 27 mmol/L (ref 0–100)

## 2011-12-30 LAB — POCT I-STAT 3, ART BLOOD GAS (G3+)
O2 Saturation: 87 %
pCO2 arterial: 41.4 mmHg (ref 35.0–45.0)
pH, Arterial: 7.366 (ref 7.350–7.450)
pO2, Arterial: 55 mmHg — ABNORMAL LOW (ref 80.0–100.0)

## 2011-12-30 LAB — POCT I-STAT GLUCOSE
Glucose, Bld: 166 mg/dL — ABNORMAL HIGH (ref 70–99)
Operator id: 221371

## 2011-12-30 SURGERY — JV LEFT AND RIGHT HEART CATHETERIZATION WITH CORONARY ANGIOGRAM
Anesthesia: Moderate Sedation

## 2011-12-30 MED ORDER — DIAZEPAM 5 MG PO TABS
5.0000 mg | ORAL_TABLET | ORAL | Status: AC
  Administered 2011-12-30: 5 mg via ORAL

## 2011-12-30 MED ORDER — SITAGLIPTIN PHOS-METFORMIN HCL 50-1000 MG PO TABS: 1.0000 | ORAL_TABLET | Freq: Every day | ORAL | Status: DC

## 2011-12-30 MED ORDER — RIVAROXABAN 20 MG PO TABS: 20.0000 mg | ORAL_TABLET | Freq: Every day | ORAL | Status: DC

## 2011-12-30 MED ORDER — ASPIRIN 81 MG PO CHEW: 81.0000 mg | CHEWABLE_TABLET | Freq: Every day | ORAL | Status: DC

## 2011-12-30 MED ORDER — SODIUM CHLORIDE 0.9 % IJ SOLN: 3.0000 mL | Freq: Two times a day (BID) | INTRAMUSCULAR | Status: DC

## 2011-12-30 MED ORDER — SODIUM CHLORIDE 0.9 % IV SOLN: 1.0000 mL/kg/h | INTRAVENOUS | Status: AC

## 2011-12-30 MED ORDER — SODIUM CHLORIDE 0.9 % IV SOLN: INTRAVENOUS | Status: DC

## 2011-12-30 MED ORDER — ASPIRIN 81 MG PO CHEW
324.0000 mg | CHEWABLE_TABLET | ORAL | Status: AC
  Administered 2011-12-30: 324 mg via ORAL

## 2011-12-30 MED ORDER — SODIUM CHLORIDE 0.9 % IV SOLN: 250.0000 mL | INTRAVENOUS | Status: DC | PRN

## 2011-12-30 MED ORDER — SODIUM CHLORIDE 0.9 % IJ SOLN: 3.0000 mL | INTRAMUSCULAR | Status: DC | PRN

## 2011-12-30 MED ORDER — ACETAMINOPHEN 325 MG PO TABS: 650.0000 mg | ORAL_TABLET | ORAL | Status: DC | PRN

## 2011-12-30 MED ORDER — ONDANSETRON HCL 4 MG/2ML IJ SOLN: 4.0000 mg | Freq: Four times a day (QID) | INTRAMUSCULAR | Status: DC | PRN

## 2011-12-30 NOTE — OR Nursing (Signed)
Discharge instructions reviewed and signed, pt stated understanding, ambulated in hall without difficulty, site level 0, transported to son's car via wheelchair 

## 2011-12-30 NOTE — OR Nursing (Signed)
Tegaderm and pressure dressing applied, site level 0, bedrest begins at 1015

## 2011-12-30 NOTE — CV Procedure (Addendum)
PROCEDURE:  Left heart catheterization with selective coronary angiography, left ventriculogram.  Right heart catheterization.  Abdominal aortogram.  INDICATIONS:   Mitral regurgitation, preoperative evaluation  The risks, benefits, and details of the procedure were explained to the patient.  The patient verbalized understanding and wanted to proceed.  Informed written consent was obtained.  PROCEDURE TECHNIQUE:  After Xylocaine anesthesia a 6F sheath was placed in the right femoral artery and a 7Fr in the with a single anterior needle wall stick.   Left coronary angiography was done using a Judkins L4 guide catheter.  Right coronary angiography was done using a Judkins R4 guide catheter.  Left ventriculography was done using a pigtail catheter.    CONTRAST:  Total of 100 cc.  COMPLICATIONS:  None.    HEMODYNAMICS:  Aortic pressure was 117/74; LV pressure was 115/17; LVEDP 26.  There was no gradient between the left ventricle and aorta.  RA 30/30, RV 60/17, RVEDP 26 mm Hg; PA pressure 56/25, PCWP 35/38, mPCWP 28 mm Hg.  PA sat 43%.  ANGIOGRAPHIC DATA:   The left main coronary artery is angiographically normal.  The left anterior descending artery is angiographically normal.  There is a medium sized diagonal which is widely patent.  The left circumflex artery is angiographically normal.  There is a large ramus vessel which is widely patent.  Large OM1 is angiographically normal.  The right coronary artery is a large dominant vessel which is angiographically normal.  LEFT VENTRICULOGRAM:  Left ventricular angiogram was done in the 30 RAO projection and revealed normal left ventricular wall motion and systolic function with an estimated ejection fraction of 60%.  LVEDP was 26 mmHg.  There is 3+ mitral regurgitation.  ABDOMINAL AORTOGRAM: No AAA.  No renal artery stenosis.  Bilateral single renal arteries appear widely patent.  IMPRESSIONS:  1. Normal left main coronary artery. 2. Normal  left anterior descending artery and its branches. 3. Normal left circumflex artery and its branches. 4. Normal right coronary artery. 5. Normal left ventricular systolic function.  LVEDP 26 mmHg.  Ejection fraction 60%. 6.  Moderate pulmonary hypertension.  CI 1.7.  RECOMMENDATION:  She will followup with Dr. Cornelius Moras for mitral valve repair.  No CAD.  Continue with medical therapy including diuretics.

## 2011-12-30 NOTE — OR Nursing (Signed)
Meal served 

## 2011-12-30 NOTE — H&P (Addendum)
  Date of Initial H&P: 12/28/11  History reviewed, patient examined, no change in status, stable for surgery.

## 2011-12-30 NOTE — OR Nursing (Signed)
Dr Varanasi at bedside to discuss results and treatment plan with pt and family 

## 2012-01-02 ENCOUNTER — Encounter: Payer: BC Managed Care – PPO | Admitting: Thoracic Surgery (Cardiothoracic Vascular Surgery)

## 2012-01-11 DIAGNOSIS — I639 Cerebral infarction, unspecified: Secondary | ICD-10-CM

## 2012-01-11 HISTORY — DX: Cerebral infarction, unspecified: I63.9

## 2012-01-16 ENCOUNTER — Encounter: Payer: Self-pay | Admitting: Thoracic Surgery (Cardiothoracic Vascular Surgery)

## 2012-01-16 ENCOUNTER — Institutional Professional Consult (permissible substitution) (INDEPENDENT_AMBULATORY_CARE_PROVIDER_SITE_OTHER): Payer: BC Managed Care – PPO | Admitting: Thoracic Surgery (Cardiothoracic Vascular Surgery)

## 2012-01-16 ENCOUNTER — Other Ambulatory Visit: Payer: Self-pay | Admitting: *Deleted

## 2012-01-16 VITALS — BP 147/93 | HR 78 | Resp 20 | Ht 66.0 in | Wt 238.0 lb

## 2012-01-16 DIAGNOSIS — I071 Rheumatic tricuspid insufficiency: Secondary | ICD-10-CM

## 2012-01-16 DIAGNOSIS — I509 Heart failure, unspecified: Secondary | ICD-10-CM

## 2012-01-16 DIAGNOSIS — I079 Rheumatic tricuspid valve disease, unspecified: Secondary | ICD-10-CM

## 2012-01-16 DIAGNOSIS — I4891 Unspecified atrial fibrillation: Secondary | ICD-10-CM

## 2012-01-16 DIAGNOSIS — I5033 Acute on chronic diastolic (congestive) heart failure: Secondary | ICD-10-CM

## 2012-01-16 DIAGNOSIS — I351 Nonrheumatic aortic (valve) insufficiency: Secondary | ICD-10-CM

## 2012-01-16 DIAGNOSIS — I340 Nonrheumatic mitral (valve) insufficiency: Secondary | ICD-10-CM

## 2012-01-16 DIAGNOSIS — E669 Obesity, unspecified: Secondary | ICD-10-CM

## 2012-01-16 DIAGNOSIS — I00 Rheumatic fever without heart involvement: Secondary | ICD-10-CM

## 2012-01-16 DIAGNOSIS — I359 Nonrheumatic aortic valve disorder, unspecified: Secondary | ICD-10-CM

## 2012-01-16 DIAGNOSIS — I272 Pulmonary hypertension, unspecified: Secondary | ICD-10-CM

## 2012-01-16 DIAGNOSIS — I059 Rheumatic mitral valve disease, unspecified: Secondary | ICD-10-CM

## 2012-01-16 DIAGNOSIS — I2789 Other specified pulmonary heart diseases: Secondary | ICD-10-CM

## 2012-01-16 HISTORY — DX: Rheumatic tricuspid insufficiency: I07.1

## 2012-01-16 HISTORY — DX: Rheumatic fever without heart involvement: I00

## 2012-01-16 HISTORY — DX: Obesity, unspecified: E66.9

## 2012-01-16 NOTE — Patient Instructions (Signed)
Schedule breathing function test.  Return with family in 3 weeks to make final plans for surgery

## 2012-01-16 NOTE — Progress Notes (Signed)
[address]            [name] [address]          [phone]     CARDIOTHORACIC SURGERY CONSULTATION REPORT  Referring Provider is Corky Crafts *MD PCP is Hulda Humphrey, NP  Chief Complaint  Patient presents with  . Mitral Regurgitation    Referral from Dr Eldridge Dace for surgical eval on severe MR and AI, Cardiac Cath on 12/30/11, Echo on 12/21/11  . Aortic Insuffiency  . Congestive Heart Failure    HPI:  Patient is an obese 57yo African American female from [place] with reported history of rheumatic fever during childhood and a long h/o heart murmur, who has been followed for several years by Dr Eldridge Dace with chronic diastolic congestive heart failure, mitral regurgitation, and chronic persistent atrial fibrillation.  The patient states that she first began to go downhill more than 10 years ago when she first began to experience chronic exertional shortness of breath. Prior to that she used to work 3 jobs and remain quite active physically. Over the past 10 years she describes slow gradual progression of worsening exertional shortness of breath and fatigue. In April 2012 she was first diagnosed with persistent atrial fibrillation. She apparently underwent cardioversion at that time but rapidly returned to atrial fibrillation. She states that over the past year or 2 symptoms have continued to get worse, and she was hospitalized a month ago with acute exacerbation of chronic diastolic heart failure with acute pulmonary edema. Transthoracic and subsequent transesophageal echocardiograms performed during that hospitalization documented the presence of severe mitral regurgitation. She was treated medically and later underwent cardiac catheterization that revealed normal coronary artery anatomy with no significant coronary artery disease but at least moderate pulmonary hypertension. The patient has been referred to consider elective surgical  intervention.  The patient currently describes chronic stable symptoms of severe exertional shortness of breath. She gets short of breath with very mild activity and is now having trouble getting through the day at work.. Over the last few weeks she has not had resting shortness of breath, although she did prior to her hospitalization last month. She describes chronic orthopnea denies any history of PND. She has chronic mild lower extremity edema that is somewhat improved on diuretic therapy. She denies abdominal swelling or bloating. She has never had any chest pain. She denies any palpitations, dizzy spells or syncope.  Past Medical History  Diagnosis Date  . Asthma   . Hypertension   . Obesity   . Chronic diastolic congestive heart failure   . Carotid bruit   . Hypercholesterolemia   . Polyp of rectum   . Abnormal chest CT   . Persistent atrial fibrillation   . Bronchospasm 1998  . Atrial enlargement, left     severe  . Heart murmur   . Shortness of breath   . Sleep apnea     DOES NOT HAVE CPAP  . Diabetes mellitus     insulin dependent  . Mitral regurgitation   . Tricuspid regurgitation 01/16/2012  . Rheumatic fever 01/16/2012    Reported during childhood  . Atrial fibrillation 01/05/2011    Chronic persistent, failed DCCV   . Obesity (BMI 30-39.9) 01/16/2012    Past Surgical History  Procedure Date  . Cardiovascular stress test 09/2010  . Cardiac catheterization >5 years  . Cardioversion 03/25/2011  Procedure: CARDIOVERSION;  Surgeon: Corky Crafts, MD;  Location: [facility] OR;  Service: Cardiovascular;  Laterality: N/A;  . Tee without cardioversion 12/21/2011    Procedure: TRANSESOPHAGEAL ECHOCARDIOGRAM (TEE);  Surgeon: Corky Crafts, MD;  Location: [facility] ENDOSCOPY;  Service: Cardiovascular;  Laterality: N/A;    Family History  Problem Relation Age of Onset  . Asthma Mother     History   Social History  . Marital Status: Married    Spouse Name: N/A    Number  of Children: N/A  . 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 Smoking status: Never Smoker   . Smokeless tobacco: Never Used  . Alcohol Use: No  . Drug Use: No  . Sexually Active:    Other Topics Concern  . Not on file   Social History Narrative   Pt lives in [place] with spouse.  Works at [employer]. 2 grown children, 1 grandchild    Current Outpatient Prescriptions  Medication Sig Dispense Refill  . albuterol (VENTOLIN HFA) 108 (90 BASE) MCG/ACT inhaler Inhale 2 puffs into the lungs every 6 (six) hours as needed. For shortness of breath      . calcium carbonate (OS-CAL - DOSED IN MG OF ELEMENTAL CALCIUM) 1250 MG tablet Take 1 tablet by mouth daily.        . Cholecalciferol (VITAMIN D) 1000 UNITS capsule Take 1,000 Units by mouth daily.       [name] diltiazem (TIAZAC) 360 MG 24 hr capsule Take 1 capsule (360 mg total) by mouth daily.  30 capsule  11  . DIOVAN 320 MG tablet Take 320 mg by mouth daily. Once daily      . furosemide (LASIX) 40 MG tablet Take 1 tablet (40 mg total) by mouth 2 (two) times daily.  60 tablet  11  . insulin glargine (LANTUS SOLOSTAR) 100 UNIT/ML injection Inject 30-35 Units into the skin See admin instructions. Inject 30 units once daily and inject 35 units daily at bedtime.      [name] LIPITOR 40 MG tablet Take 40 mg by mouth daily. Once daily      . Nebivolol HCl 20 MG TABS Take 1 tablet by mouth daily. Take 2 by mouth daily      . Omega-3 Fatty Acids (FISH OIL) 1000 MG CAPS Take 1 capsule by mouth daily.       . potassium chloride SA (K-DUR,KLOR-CON) 20 MEQ tablet Take 1 tablet (20 mEq total) by mouth daily.  30 tablet  11  . Rivaroxaban (XARELTO) 20 MG TABS Take 1 tablet (20 mg total) by mouth daily.  30 tablet    . sitaGLIPtan-metformin (JANUMET) 50-1000 MG per tablet Take 1 tablet by mouth daily.      . [DISCONTINUED] flecainide (TAMBOCOR) 100 MG tablet Take 1 tablet  (100 mg total) by mouth every 12 (twelve) hours.  60 tablet  11    No Known Allergies    Review of Systems:   General:  normal appetite, decreased energy, no weight gain, no weight loss, no fever  Cardiac:  no chest pain with exertion, no chest pain at rest, +SOB with mild exertion, occasional resting SOB, no PND, + orthopnea, no palpitations, + arrhythmia, + atrial fibrillation, + LE edema, no dizzy spells, no syncope  Respiratory:  + chronic shortness of breath, no home oxygen, no productive cough, no dry cough, no bronchitis, + wheezing, no hemoptysis,  no asthma, no pain with inspiration or cough, + sleep apnea, does not use CPAP at night  GI:   no difficulty swallowing, no reflux, no frequent heartburn, no hiatal hernia, no abdominal pain, no constipation, no diarrhea, no hematochezia, no hematemesis, no melena  GU:   no dysuria,  no frequency, no urinary tract infection, no hematuria, no kidney stones, no kidney disease  Vascular:  no pain suggestive of claudication, no pain in feet, no leg cramps, no varicose veins, no DVT, no non-healing foot ulcer  Neuro:   no stroke, no TIA's, no seizures, no headaches, no temporary blindness one eye,  no slurred speech, no peripheral neuropathy, no chronic pain, no instability of gait, no memory/cognitive dysfunction  Musculoskeletal: no arthritis, no joint swelling, no myalgias, no difficulty walking, normal mobility   Skin:   no rash, no itching, no skin infections, no pressure sores or ulcerations  Psych:   no anxiety, no depression, no nervousness, no unusual recent stress  Eyes:   no blurry vision, no floaters, no recent vision changes, + wears glasses   ENT:   no hearing loss, no loose or painful teeth, no dentures, last saw dentist last year - dentist aware of heart murmur  Hematologic:  no easy bruising, no abnormal bleeding, no clotting disorder, no frequent epistaxis  Endocrine:  + diabetes, does check CBG's at home     Physical  Exam:   BP 147/93  Pulse 78  Resp 20  Ht 5\' 6"  (1.676 m)  Wt 238 lb (107.956 kg)  BMI 38.41 kg/m2  SpO2 97%  General:  Obese but o/w  well-appearing  HEENT:  Unremarkable   Neck:   no JVD, no bruits, no adenopathy   Chest:   clear to auscultation, symmetrical breath sounds, no wheezes, no rhonchi   CV:   RRR, grade III/VI systolic murmur best LSB  Abdomen:  soft, non-tender, no masses   Extremities:  warm, well-perfused, pulses not palpable, mild bilateral LE edema  Rectal/GU  Deferred  Neuro:   Grossly non-focal and symmetrical throughout  Skin:   Clean and dry, no rashes, no breakdown   Diagnostic Tests:  Transthoracic Echocardiography  Patient:    [name], [name] MR #:       [id number] Study Date: 12/19/2011 Gender:     F Age:        47 Height: Weight: BSA: Pt. Status: Room:       3W41C    Deniece Portela, Traci  PERFORMING   [facility]  SONOGRAPHER  Silvano Bilis, RCS  ATTENDING    Lance Muss cc:  ------------------------------------------------------------ LV EF: 55%  ------------------------------------------------------------ Indications:      CHF - 428.0.  ------------------------------------------------------------ History:   PMH:   Dyspnea.  Atrial fibrillation.  Risk factors:  Hypertension. Diabetes mellitus. Obese. Dyslipidemia.  ------------------------------------------------------------ Study Conclusions  - Left ventricle: The cavity size was normal. There was mild   concentric hypertrophy. The estimated ejection fraction   was 55%. Wall motion was normal; there were no regional   wall motion abnormalities. Left ventricular diastolic   function parameters were normal. - Mitral valve: Moderate thickening of the anterior leaflet,   consistent with myxomatous proliferation. Moderate,   holosystolicprolapse, involving the anterior leaflet.   Moderate to severe regurgitation directed  eccentrically   and posteriorly. - Left atrium: The atrium was moderately dilated. - Right ventricle: The cavity size was moderately dilated. -  Right atrium: The atrium was mildly dilated. - Atrial septum: No defect or patent foramen ovale was   identified. - Tricuspid valve: Moderate regurgitation. - Pulmonary arteries: PA peak pressure: 63mm Hg (S). Impressions:  - The right ventricular systolic pressure was increased   consistent with moderate pulmonary hypertension. Transthoracic echocardiography.  M-mode, complete 2D, spectral Doppler, and color Doppler.  Blood pressure: 134/86.  Patient status:  Inpatient.  Location:  Bedside.   ------------------------------------------------------------  ------------------------------------------------------------ Left ventricle:  The cavity size was normal. There was mild concentric hypertrophy. The estimated ejection fraction was 55%. Wall motion was normal; there were no regional wall motion abnormalities. The transmitral flow pattern was normal. The deceleration time of the early transmitral flow velocity was normal. The pulmonary vein flow pattern was normal. The tissue Doppler parameters were normal. Left ventricular diastolic function parameters were normal.  ------------------------------------------------------------ Aortic valve:   Mildly thickened, mildly calcified leaflets.   ------------------------------------------------------------ Aorta:  The aorta was normal, not dilated, and non-diseased.   ------------------------------------------------------------ Mitral valve:   Moderate thickening of the anterior leaflet, consistent with myxomatous proliferation.  Moderate, holosystolicprolapse, involving the anterior leaflet. Doppler:   Moderate to severe regurgitation directed eccentrically and posteriorly.    Mean gradient: 16mm Hg (D). Peak gradient: 35mm Hg  (D).  ------------------------------------------------------------ Left atrium:  The atrium was moderately dilated.  ------------------------------------------------------------ Atrial septum:  No defect or patent foramen ovale was identified.  ------------------------------------------------------------ Right ventricle:  The cavity size was moderately dilated.   ------------------------------------------------------------ Pulmonic valve:    Structurally normal valve.   Cusp separation was normal.  Doppler:  Transvalvular velocity was within the normal range.  No regurgitation.  ------------------------------------------------------------ Tricuspid valve:   Structurally normal valve.   Leaflet separation was normal.  Doppler:  Transvalvular velocity was within the normal range.  Moderate regurgitation.  ------------------------------------------------------------ Right atrium:  The atrium was mildly dilated.  ------------------------------------------------------------ Pericardium:  The pericardium was normal in appearance.  ------------------------------------------------------------ Systemic veins: Inferior vena cava: The vessel was dilated; the respirophasic diameter changes were blunted (< 50%); findings are consistent with elevated central venous pressure.  ------------------------------------------------------------ Post procedure conclusions Ascending Aorta:  - The aorta was normal, not dilated, and non-diseased.  ------------------------------------------------------------  2D measurements        Normal  Doppler measurements   Normal Left ventricle                 Main pulmonary LVID ED,     41.5 mm   43-52   artery chord, PLAX                    Pressure, S    63 mm   =30 LVID ES,     27.1 mm   23-38                     Hg chord, PLAX                    Aortic valve FS, chord,     35 %    >29     Regurg vel,   417 cm/s ------ PLAX                            ED LVPW, ED     13.9 mm   ------  Regurg PHT    445 ms   ------ IVS/LVPW     0.91      <  1.3    Regurg         70 mm   ------ ratio, ED                      gradient, ED      Hg Ventricular septum             Mitral valve IVS, ED      12.6 mm   ------  Mean vel, D   180 cm/s ------ Aorta                          Mean           16 mm   ------ Root diam,     29 mm   ------  gradient, D       Hg ED                             Peak           35 mm   ------ Left atrium                    gradient, D       Hg AP dim         50 mm   ------  Annulus VTI  53.6 cm   ------                                Tricuspid valve                                Regurg peak   365 cm/s ------                                vel                                Peak RV-RA     53 mm   ------                                gradient, S       Hg                                Systemic veins                                Estimated      10 mm   ------                                CVP               Hg                                Right ventricle  Pressure, S    63 mm   <30                                                  Hg   ------------------------------------------------------------ Prepared and Electronically Authenticated by  Armanda Magic 2013-12-09T16:58:08.587    Transesophageal Echocardiography  Patient:    [name], [name] MR #:       [id number] Study Date: 12/21/2011 Gender:     F Age:        40 Height: Weight: BSA: Pt. Status: Room:       3W41C    Ethelle Lyon  PERFORMING   [facility]  SONOGRAPHER  Jeryl Columbia  ATTENDING    Lance Muss  [place], [place] cc:  ------------------------------------------------------------ LV EF: 55% -   60%  ------------------------------------------------------------ History:   PMH:  mitral regurgitation  ------------------------------------------------------------ Study  Conclusions  - Left ventricle: Systolic function was normal. The   estimated ejection fraction was in the range of 55% to   60%. - Aortic valve: Mild regurgitation. - Mitral valve: Moderate prolapse, involving the anterior   leaflet. Severe regurgitation. - Left atrium: The atrium was mildly to moderately dilated.   No evidence of thrombus in the atrial cavity or appendage. - Right atrium: The atrium was dilated. - Tricuspid valve: Moderate regurgitation. Transesophageal echocardiography.  2D and color Doppler. Patient status:  Inpatient.  Location:  Endoscopy.  ------------------------------------------------------------  ------------------------------------------------------------ Left ventricle:  Systolic function was normal. The estimated ejection fraction was in the range of 55% to 60%.  ------------------------------------------------------------ Aortic valve:   Trileaflet.  Doppler:   There was no stenosis.    Mild regurgitation.  ------------------------------------------------------------ Aorta:  Aortic arch: The aortic arch had no disease.  ------------------------------------------------------------ Mitral valve:   Moderately thickened leaflets .  Moderate prolapse, involving the anterior leaflet.  Doppler:   Severe regurgitation.  ------------------------------------------------------------ Left atrium:  The atrium was mildly to moderately dilated. No evidence of thrombus in the atrial cavity or appendage.   ------------------------------------------------------------ Pulmonary veins: Left upper pulmonary vein:  There was systolic flow reversal.  ------------------------------------------------------------ Pulmonic valve:    The valve appears to be grossly normal.   ------------------------------------------------------------ Tricuspid valve:   Mildly thickened leaflets.  Doppler: Moderate  regurgitation.  ------------------------------------------------------------ Right atrium:  The atrium was dilated.  ------------------------------------------------------------ Pericardium:  There was no pericardial effusion.   ------------------------------------------------------------ Prepared and Electronically Authenticated by  Everette Rank 2013-12-11T16:36:[id number]    CARDIAC CATHETERIZATION   PROCEDURE:  Left heart catheterization with selective coronary angiography, left ventriculogram.  Right heart catheterization.  Abdominal aortogram.  INDICATIONS:   Mitral regurgitation, preoperative evaluation  The risks, benefits, and details of the procedure were explained to the patient.  The patient verbalized understanding and wanted to proceed.  Informed written consent was obtained.  PROCEDURE TECHNIQUE:  After Xylocaine anesthesia a 39F sheath was placed in the right femoral artery and a 7Fr in the with a single anterior needle wall stick.   Left coronary angiography was done using a Judkins L4 guide catheter.  Right coronary angiography was done using a Judkins R4 guide catheter.  Left ventriculography was done using a pigtail catheter.      CONTRAST:  Total of 100 cc.  COMPLICATIONS:  None.    HEMODYNAMICS:  Aortic pressure was 117/74; LV  pressure was 115/17; LVEDP 26.  There was no gradient between the left ventricle and aorta.  RA 30/30, RV 60/17, RVEDP 26 mm Hg; PA pressure 56/25, PCWP 35/38, mPCWP 28 mm Hg.  PA sat 43%.  ANGIOGRAPHIC DATA:   The left main coronary artery is angiographically normal.  The left anterior descending artery is angiographically normal.  There is a medium sized diagonal which is widely patent.  The left circumflex artery is angiographically normal.  There is a large ramus vessel which is widely patent.  Large OM1 is angiographically normal.  The right coronary artery is a large dominant vessel which is angiographically normal.  LEFT  VENTRICULOGRAM:  Left ventricular angiogram was done in the 30 RAO projection and revealed normal left ventricular wall motion and systolic function with an estimated ejection fraction of 60%.  LVEDP was 26 mmHg.  There is 3+ mitral regurgitation.  ABDOMINAL AORTOGRAM: No AAA.  No renal artery stenosis.  Bilateral single renal arteries appear widely patent.  IMPRESSIONS:    1. Normal left main coronary artery.  2. Normal left anterior descending artery and its branches.  3. Normal left circumflex artery and its branches.  4. Normal right coronary artery.  5. Normal left ventricular systolic function.  LVEDP 26 mmHg.  Ejection fraction 60%. 6.  Moderate pulmonary hypertension.  CI 1.7.   Impression:  Patient has severe symptomatic mitral regurgitation with slow gradual progression of chronic diastolic congestive heart failure that is further complicated by the presence of chronic persistent atrial fibrillation, moderate tricuspid regurgitation, mild aortic insufficiency and obesity.  The patient has reported history of rheumatic fever during childhood, and functional anatomy of the mitral valve may be consistent with underlying rheumatic disease due to severe restriction of the posterior leaflet causing overriding or pseudo-prolapse of the anterior leaflet with posteriorly directed jet of regurgitation coursing around the severely dilated left atrium.  There remains a possibility that the valve could be repairable as the anterior leaflet is only mildly thickened and appears reasonably mobile. However, there remains a significant possibility that the valve would need to be replaced. Risks of surgery will be somewhat increased do to the patient's obesity and fairly significant chronic shortness of breath. She might benefit from concomitant Maze procedure.  Tricuspid valve repair may be indicated as well depending upon severity of tricuspid regurgitation and/or dilatation of the tricuspid  annulus.   Plan:  The rationale for elective mitral valve repair surgery has been explained, including a comparison between surgery and continued medical therapy with close follow-up.  The likelihood of successful and durable valve repair has been discussed with particular reference to the findings of their recent echocardiogram.  Based upon these findings and previous experience, I have quoted them a 50 percent likelihood of successful valve repair.  In the unlikely event that their valve cannot be successfully repaired, we discussed the possibility of replacing the mitral valve using a mechanical prosthesis with the attendant need for long-term anticoagulation versus the alternative of replacing it using a bioprosthetic tissue valve with its potential for late structural valve deterioration and failure, depending upon the patient's longevity.  Because of the patient's relatively young age I would tend to favor use of a mechanical prosthesis if the mitral valve cannot be repaired.  Alternative surgical approaches have been discussed, including a comparison between conventional sternotomy and minimally-invasive techniques.  The relative risks and benefits of each have been reviewed as they pertain to the patient's specific circumstances, and all of their  questions have been addressed.  The patient wants to wait a few weeks before proceeding with elective surgery so that she can make certain that appropriate arrangements will be in place at home and at work. We will plan to see her back in 3 weeks' time. I've asked that she have her husband and family come with her for her next office visit so that we can reviewed surgical options and make final plans at that time. All of her questions been addressed.    Salvatore Decent. Cornelius Moras, MD 01/16/2012 10:56 AM

## 2012-01-24 ENCOUNTER — Ambulatory Visit (HOSPITAL_COMMUNITY)
Admission: RE | Admit: 2012-01-24 | Discharge: 2012-01-24 | Disposition: A | Discharge status: Home / Self Care | Payer: BC Managed Care – PPO | Source: Ambulatory Visit | Attending: Thoracic Surgery (Cardiothoracic Vascular Surgery) | Admitting: Thoracic Surgery (Cardiothoracic Vascular Surgery)

## 2012-01-24 DIAGNOSIS — I340 Nonrheumatic mitral (valve) insufficiency: Secondary | ICD-10-CM

## 2012-01-24 DIAGNOSIS — Z01811 Encounter for preprocedural respiratory examination: Secondary | ICD-10-CM | POA: Insufficient documentation

## 2012-01-24 LAB — PULMONARY FUNCTION TEST

## 2012-01-24 MED ORDER — ALBUTEROL SULFATE (5 MG/ML) 0.5% IN NEBU
2.5000 mg | INHALATION_SOLUTION | Freq: Once | RESPIRATORY_TRACT | Status: AC
  Administered 2012-01-24: 2.5 mg via RESPIRATORY_TRACT

## 2012-02-06 ENCOUNTER — Ambulatory Visit (INDEPENDENT_AMBULATORY_CARE_PROVIDER_SITE_OTHER): Payer: BC Managed Care – PPO | Admitting: Thoracic Surgery (Cardiothoracic Vascular Surgery)

## 2012-02-06 ENCOUNTER — Encounter: Payer: Self-pay | Admitting: Thoracic Surgery (Cardiothoracic Vascular Surgery)

## 2012-02-06 ENCOUNTER — Other Ambulatory Visit: Payer: Self-pay | Admitting: *Deleted

## 2012-02-06 VITALS — BP 147/92 | HR 90 | Resp 18 | Ht 66.0 in | Wt 234.0 lb

## 2012-02-06 DIAGNOSIS — Z0279 Encounter for issue of other medical certificate: Secondary | ICD-10-CM

## 2012-02-06 DIAGNOSIS — I071 Rheumatic tricuspid insufficiency: Secondary | ICD-10-CM

## 2012-02-06 DIAGNOSIS — I079 Rheumatic tricuspid valve disease, unspecified: Secondary | ICD-10-CM

## 2012-02-06 DIAGNOSIS — I340 Nonrheumatic mitral (valve) insufficiency: Secondary | ICD-10-CM | POA: Insufficient documentation

## 2012-02-06 DIAGNOSIS — I059 Rheumatic mitral valve disease, unspecified: Secondary | ICD-10-CM

## 2012-02-06 DIAGNOSIS — I509 Heart failure, unspecified: Secondary | ICD-10-CM | POA: Insufficient documentation

## 2012-02-06 DIAGNOSIS — I4891 Unspecified atrial fibrillation: Secondary | ICD-10-CM

## 2012-02-06 MED ORDER — AMIODARONE HCL 200 MG PO TABS: 200.0000 mg | ORAL_TABLET | Freq: Two times a day (BID) | ORAL | Status: DC

## 2012-02-06 NOTE — Progress Notes (Signed)
[address]            [name] [address]          [phone]     CARDIOTHORACIC SURGERY OFFICE NOTE  Referring Provider is Corky Crafts., *MD PCP is Hulda Humphrey, NP   HPI:  Patient returns for followup of mitral regurgitation, tricuspid regurgitation, and atrial fibrillation. She was originally seen in consultation 3 weeks ago. Since then she reports that she remains clinically stable with significant exertional shortness of breath. She states that she's also been having more sensation of tightness across her chest. She denies resting shortness of breath. The tightness across her chest is mild and seems to be unrelated to physical activity. It seems to wax and wane sporadically. She has not had any dizzy spells or syncope. Remainder of her review of systems is unchanged from previously.   Current Outpatient Prescriptions  Medication Sig Dispense Refill  . albuterol (VENTOLIN HFA) 108 (90 BASE) MCG/ACT inhaler Inhale 2 puffs into the lungs every 6 (six) hours as needed. For shortness of breath      . calcium carbonate (OS-CAL - DOSED IN MG OF ELEMENTAL CALCIUM) 1250 MG tablet Take 1 tablet by mouth daily.        . Cholecalciferol (VITAMIN D) 1000 UNITS capsule Take 1,000 Units by mouth daily.       [name] diltiazem (TIAZAC) 360 MG 24 hr capsule Take 1 capsule (360 mg total) by mouth daily.  30 capsule  11  . DIOVAN 320 MG tablet Take 320 mg by mouth daily. Once daily      . furosemide (LASIX) 40 MG tablet Take 1 tablet (40 mg total) by mouth 2 (two) times daily.  60 tablet  11  . insulin glargine (LANTUS SOLOSTAR) 100 UNIT/ML injection Inject 30-35 Units into the skin See admin instructions. Inject 30 units once daily and inject 35 units daily at bedtime.      [name] LIPITOR 40 MG tablet Take 40 mg by mouth daily. Once daily      . Nebivolol HCl 20 MG TABS Take 1 tablet by mouth daily. Take 2 by mouth daily      . Omega-3 Fatty Acids (FISH OIL)  1000 MG CAPS Take 1 capsule by mouth daily.       . potassium chloride SA (K-DUR,KLOR-CON) 20 MEQ tablet Take 1 tablet (20 mEq total) by mouth daily.  30 tablet  11  . Rivaroxaban (XARELTO) 20 MG TABS Take 1 tablet (20 mg total) by mouth daily.  30 tablet    . sitaGLIPtan-metformin (JANUMET) 50-1000 MG per tablet Take 1 tablet by mouth daily.      . [DISCONTINUED] flecainide (TAMBOCOR) 100 MG tablet Take 1 tablet (100 mg total) by mouth every 12 (twelve) hours.  60 tablet  11      Physical Exam:   BP 147/92  Pulse 90  Resp 18  Ht 5\' 6"  (1.676 m)  Wt 234 lb (106.142 kg)  BMI 37.77 kg/m2  SpO2 96%  General:  Obese but well-appearing  Chest:   Clear to auscultation with a few basilar crackles  CV:   Regular rhythm with prominent systolic murmur  Incisions:  n/a  Abdomen:  Soft and nontender  Extremities:  Warm and well-perfused  Diagnostic Tests:  n/a    Impression:   Patient has severe symptomatic mitral regurgitation with slow  gradual progression of chronic diastolic congestive heart failure that is further complicated by the presence of chronic persistent atrial fibrillation, moderate tricuspid regurgitation, mild aortic insufficiency and obesity. The patient has reported history of rheumatic fever during childhood, and functional anatomy of the mitral valve may be consistent with underlying rheumatic disease due to severe restriction of the posterior leaflet causing overriding or pseudo-prolapse of the anterior leaflet with posteriorly directed jet of regurgitation coursing around the severely dilated left atrium. There remains a possibility that the valve could be repairable as the anterior leaflet is only mildly thickened and appears reasonably mobile. However, there remains a significant possibility that the valve would need to be replaced. Risks of surgery will be somewhat increased do to the patient's obesity and fairly significant chronic shortness of breath. She might benefit  from concomitant Maze procedure. Tricuspid valve repair may be indicated as well depending upon severity of tricuspid regurgitation and/or dilatation of the tricuspid annulus.    Plan:   I spent in excess of 30 minutes with the patient and her husband discussing the indications, risks, and potential benefits of surgery.  The rationale for elective mitral valve surgery has been explained, including a comparison between surgery and continued medical therapy with close follow-up. The likelihood of successful and durable valve repair has been discussed with particular reference to the findings of their recent echocardiogram. Based upon these findings and previous experience, I have quoted them a 50 percent likelihood of successful valve repair. In the event that their valve cannot be successfully repaired, we discussed the possibility of replacing the mitral valve using a mechanical prosthesis with the attendant need for long-term anticoagulation versus the alternative of replacing it using a bioprosthetic tissue valve with its potential for late structural valve deterioration and failure, depending upon the patient's longevity. Because of the patient's relatively young age I would tend to favor use of a mechanical prosthesis if the mitral valve cannot be repaired. Alternative surgical approaches have been discussed, including a comparison between conventional sternotomy and minimally-invasive techniques. The relative risks and benefits of each have been reviewed as they pertain to the patient's specific circumstances, and all of their questions have been addressed.  Because of her morbid obesity and significant pulmonary hypertension with signs of worsening right ventricular dysfunction, I feel that conventional sternotomy might be more prudent under the circumstances.  The patient and her husband understand and accept all potential associated risks of surgery including but not limited to risk of death, stroke,  myocardial infarction, congestive heart failure, respiratory failure, renal failure, bleeding requiring blood transfusion and/or reexploration, arrhythmia, heart block or bradycardia requiring permanent pacemaker, pneumonia, pleural effusion, wound infection, pulmonary embolus or other thromboembolic complication, chronic pain or other delayed complications including possible late complications related to valve repair or replacement.  All questions answered.  We plan to proceed with surgery on Wednesday, February 5. I have given the patient a prescription for amiodarone to begin one week prior to surgery. She has been instructed to stop taking Xarelto after she takes her dose this Friday.      Salvatore Decent. Cornelius Moras, MD 02/06/2012 10:18 AM

## 2012-02-06 NOTE — Patient Instructions (Signed)
Begin amiodarone 1 week prior to surgery (Wednesday)  Stop taking your blood thinner - Xarelto (rivaroxaban) - after taking your pill on Friday  Continue all other medications as currently prescribed through the night before surgery  On the morning of surgery take only diltiazem (Tiazac) and Nebivolol with a sip of water

## 2012-02-13 ENCOUNTER — Ambulatory Visit (HOSPITAL_COMMUNITY): Admission: RE | Admit: 2012-02-13 | Payer: BC Managed Care – PPO | Source: Ambulatory Visit

## 2012-02-13 ENCOUNTER — Encounter (HOSPITAL_COMMUNITY): Payer: Self-pay

## 2012-02-13 ENCOUNTER — Encounter (HOSPITAL_COMMUNITY)
Admission: RE | Admit: 2012-02-13 | Discharge: 2012-02-13 | Disposition: A | Discharge status: Home / Self Care | Payer: BC Managed Care – PPO | Source: Ambulatory Visit | Attending: Thoracic Surgery (Cardiothoracic Vascular Surgery) | Admitting: Thoracic Surgery (Cardiothoracic Vascular Surgery)

## 2012-02-13 ENCOUNTER — Ambulatory Visit (HOSPITAL_COMMUNITY)
Admission: RE | Admit: 2012-02-13 | Discharge: 2012-02-13 | Disposition: A | Discharge status: Home / Self Care | Payer: BC Managed Care – PPO | Source: Ambulatory Visit | Attending: Thoracic Surgery (Cardiothoracic Vascular Surgery) | Admitting: Thoracic Surgery (Cardiothoracic Vascular Surgery)

## 2012-02-13 ENCOUNTER — Encounter (HOSPITAL_COMMUNITY): Payer: Self-pay | Admitting: Pharmacy Technician

## 2012-02-13 VITALS — BP 139/95 | HR 108 | Temp 98.7°F | Resp 20 | Ht 67.0 in | Wt 229.3 lb

## 2012-02-13 DIAGNOSIS — I071 Rheumatic tricuspid insufficiency: Secondary | ICD-10-CM

## 2012-02-13 DIAGNOSIS — Z01818 Encounter for other preprocedural examination: Secondary | ICD-10-CM | POA: Insufficient documentation

## 2012-02-13 DIAGNOSIS — Z01812 Encounter for preprocedural laboratory examination: Secondary | ICD-10-CM | POA: Insufficient documentation

## 2012-02-13 DIAGNOSIS — E119 Type 2 diabetes mellitus without complications: Secondary | ICD-10-CM | POA: Insufficient documentation

## 2012-02-13 DIAGNOSIS — I4430 Unspecified atrioventricular block: Secondary | ICD-10-CM | POA: Insufficient documentation

## 2012-02-13 DIAGNOSIS — R9431 Abnormal electrocardiogram [ECG] [EKG]: Secondary | ICD-10-CM | POA: Insufficient documentation

## 2012-02-13 DIAGNOSIS — I059 Rheumatic mitral valve disease, unspecified: Secondary | ICD-10-CM

## 2012-02-13 DIAGNOSIS — I4891 Unspecified atrial fibrillation: Secondary | ICD-10-CM

## 2012-02-13 DIAGNOSIS — I10 Essential (primary) hypertension: Secondary | ICD-10-CM | POA: Insufficient documentation

## 2012-02-13 DIAGNOSIS — Z01810 Encounter for preprocedural cardiovascular examination: Secondary | ICD-10-CM

## 2012-02-13 HISTORY — DX: Personal history of other medical treatment: Z92.89

## 2012-02-13 LAB — CBC
HCT: 37.7 % (ref 36.0–46.0)
Hemoglobin: 12.2 g/dL (ref 12.0–15.0)
MCH: 29.7 pg (ref 26.0–34.0)
MCHC: 32.4 g/dL (ref 30.0–36.0)

## 2012-02-13 LAB — BLOOD GAS, ARTERIAL
Drawn by: 344381
TCO2: 27.4 mmol/L (ref 0–100)
pCO2 arterial: 40.7 mmHg (ref 35.0–45.0)
pH, Arterial: 7.423 (ref 7.350–7.450)

## 2012-02-13 LAB — HEMOGLOBIN A1C: Hgb A1c MFr Bld: 8.7 % — ABNORMAL HIGH (ref <5.7)

## 2012-02-13 LAB — URINE MICROSCOPIC-ADD ON

## 2012-02-13 LAB — URINALYSIS, ROUTINE W REFLEX MICROSCOPIC
Hgb urine dipstick: NEGATIVE
Nitrite: NEGATIVE
Specific Gravity, Urine: 1.026 (ref 1.005–1.030)
Urobilinogen, UA: 4 mg/dL — ABNORMAL HIGH (ref 0.0–1.0)

## 2012-02-13 LAB — COMPREHENSIVE METABOLIC PANEL
BUN: 17 mg/dL (ref 6–23)
Calcium: 9.4 mg/dL (ref 8.4–10.5)
GFR calc Af Amer: 88 mL/min — ABNORMAL LOW (ref >90)
Glucose, Bld: 133 mg/dL — ABNORMAL HIGH (ref 70–99)
Total Protein: 7.9 g/dL (ref 6.0–8.3)

## 2012-02-13 LAB — PROTIME-INR: INR: 1.05 (ref 0.00–1.49)

## 2012-02-13 LAB — SURGICAL PCR SCREEN: Staphylococcus aureus: NEGATIVE

## 2012-02-13 NOTE — Pre-Procedure Instructions (Signed)
MATTHEW PAIS  02/13/2012   Your procedure is scheduled on:  February 5th.                           Report to [facility] 6:30 AM.  Call this number if you have problems the morning of surgery: [phone]   Remember:   Do not eat food or drink liquids after midnight.    Take these medicines the morning of surgery with A SIP OF WATER: Amiodarone (Pacerone), Diltiazem (Tiazac), Nebivolol   Do not wear jewelry, make-up or nail polish.  Do not wear lotions, powders, or perfumes. You may wear deodorant.  Do not shave 48 hours prior to surgery. Men may shave face and neck.  Do not bring valuables to the hospital.  Contacts, dentures or bridgework may not be worn into surgery.  Leave suitcase in the car. After surgery it may be brought to your room.  For patients admitted to the hospital, checkout time is 11:00 AM the day of  discharge.   Patients discharged the day of surgery will not be allowed to drive  home.  Name and phone number of your driver: NA  Special Instructions: Shower using CHG 2 nights before surgery and the night before surgery.  If you shower the day of surgery use CHG.  Use special wash - you have one bottle of CHG for all showers.  You should use approximately 1/3 of the bottle for each shower.   Please read over the following fact sheets that you were given: Pain Booklet, Coughing and Deep Breathing and Surgical Site Infection Prevention

## 2012-02-13 NOTE — Progress Notes (Signed)
I have requested stress test from [facility]  and sleep study from [place] at [facility].

## 2012-02-13 NOTE — Pre-Procedure Instructions (Signed)
MURL ZOGG  02/13/2012   Your procedure is scheduled on:  Wednesday , February 5th.  Report to [facility] at 6:30 AM.  Call this number if you have problems the morning of surgery: [phone]   Remember:   Do not eat food or drink liquids after midnight.    Take these medicines the morning of surgery with A SIP OF WATER: Amiodarone (Pacerone) Diltiazem (Tiazac), Diltiazem (Tiazac), Nebivolol .   Do not wear jewelry, make-up or nail polish.  Do not wear lotions, powders, or perfumes. You may wear deodorant.  Do not shave 48 hours prior to surgery. Men may shave face and neck.  Do not bring valuables to the hospital.  Contacts, dentures or bridgework may not be worn into surgery.  Leave suitcase in the car. After surgery it may be brought to your room.  For patients admitted to the hospital, checkout time is 11:00 AM the day of  discharge.   Patients discharged the day of surgery will not be allowed to drive home.  Name and phone number of your driver: NA   Special Instructions: Shower using CHG 2 nights before surgery and the night before surgery.  If you shower the day of surgery use CHG.  Use special wash - you have one bottle of CHG for all showers.  You should use approximately 1/3 of the bottle for each shower.   Please read over the following fact sheets that you were given: Pain Booklet, Coughing and Deep Breathing, Blood Transfusion Information and Surgical Site Infection Prevention

## 2012-02-14 MED ORDER — EPINEPHRINE HCL 1 MG/ML IJ SOLN
0.5000 ug/min | INTRAVENOUS | Status: DC
  Filled 2012-02-14: qty 4

## 2012-02-14 MED ORDER — SODIUM CHLORIDE 0.9 % IV SOLN
INTRAVENOUS | Status: AC
  Administered 2012-02-15: 2.9 [IU]/h via INTRAVENOUS
  Filled 2012-02-14: qty 1

## 2012-02-14 MED ORDER — VANCOMYCIN HCL 10 G IV SOLR
1250.0000 mg | INTRAVENOUS | Status: AC
  Administered 2012-02-15: 1250 mg via INTRAVENOUS
  Filled 2012-02-14 (×2): qty 1250

## 2012-02-14 MED ORDER — POTASSIUM CHLORIDE 2 MEQ/ML IV SOLN
80.0000 meq | INTRAVENOUS | Status: DC
  Filled 2012-02-14: qty 40

## 2012-02-14 MED ORDER — MAGNESIUM SULFATE 50 % IJ SOLN
40.0000 meq | INTRAMUSCULAR | Status: DC
  Filled 2012-02-14: qty 10

## 2012-02-14 MED ORDER — GLUTARALDEHYDE 0.625% SOAKING SOLUTION
Freq: Once | TOPICAL | Status: DC
  Filled 2012-02-14: qty 50

## 2012-02-14 MED ORDER — DOPAMINE-DEXTROSE 3.2-5 MG/ML-% IV SOLN
2.0000 ug/kg/min | INTRAVENOUS | Status: AC
  Administered 2012-02-15: 3 ug/kg/min via INTRAVENOUS
  Filled 2012-02-14: qty 250

## 2012-02-14 MED ORDER — PHENYLEPHRINE HCL 10 MG/ML IJ SOLN
30.0000 ug/min | INTRAVENOUS | Status: AC
  Administered 2012-02-15: 25 ug/min via INTRAVENOUS
  Administered 2012-02-15: 35 ug/min via INTRAVENOUS
  Filled 2012-02-14: qty 2

## 2012-02-14 MED ORDER — DEXTROSE 5 % IV SOLN
1.5000 g | INTRAVENOUS | Status: AC
  Administered 2012-02-15: 1.5 g via INTRAVENOUS
  Administered 2012-02-15: .75 g via INTRAVENOUS
  Filled 2012-02-14 (×2): qty 1.5

## 2012-02-14 MED ORDER — DEXTROSE 5 % IV SOLN
750.0000 mg | INTRAVENOUS | Status: DC
  Filled 2012-02-14: qty 750

## 2012-02-14 MED ORDER — NITROGLYCERIN IN D5W 200-5 MCG/ML-% IV SOLN
2.0000 ug/min | INTRAVENOUS | Status: DC
  Filled 2012-02-14: qty 250

## 2012-02-14 MED ORDER — AMINOCAPROIC ACID 250 MG/ML IV SOLN
INTRAVENOUS | Status: AC
  Administered 2012-02-15: 13:00:00 via INTRAVENOUS
  Administered 2012-02-15: 69.8 mL/h via INTRAVENOUS
  Filled 2012-02-14: qty 40

## 2012-02-14 MED ORDER — DEXMEDETOMIDINE HCL IN NACL 400 MCG/100ML IV SOLN
0.1000 ug/kg/h | INTRAVENOUS | Status: AC
  Administered 2012-02-15: 0.3 ug/kg/h via INTRAVENOUS
  Administered 2012-02-15: 15:00:00 via INTRAVENOUS
  Filled 2012-02-14: qty 100

## 2012-02-14 MED ORDER — VERAPAMIL HCL 2.5 MG/ML IV SOLN
INTRAVENOUS | Status: DC
  Filled 2012-02-14: qty 2.5

## 2012-02-14 NOTE — Consult Note (Signed)
Anesthesia Chart Review:  Patient is a 57 year old female scheduled for MV repair versus replacement, TV repair and MAZE on 02/15/12 by Dr. Cornelius Moras.  Other history includes recurrent afib/flutter s/p cardioversion, obesity, non-smoker, DM2, Rheumatic fever, asthma, OSA, normal coronaries and LV function with moderate pulmonary HTN by 12/2011 cath. PCP is Dr. Cristopher Peru.  Cardiologist is Dr. Eldridge Dace.  Preoperative labs, CXR noted.  Carotid duplex showed no ICA stenosis.  PFTs showed an FEV1 1.34 (57%).  EKG shows a-flutter at 106 bpm, cannot rule out anterior infarct.  Rate is faster, but otherwise not significantly changed since 01/26/11 Deboraha Sprang).  Recent 2D echo, TEE, cardiac cath are in Epic for review.   Dr. Cornelius Moras has evaluated patient and felt she is an appropriate candidate for the above mentioned procedure.  She will be evaluated by her assigned anesthesiologist on the day of surgery.  If no significant changes then would anticipate she could proceed as planned.  Shonna Chock, PA-C 02/14/12 1043

## 2012-02-14 NOTE — H&P (Signed)
CARDIOTHORACIC SURGERY HISTORY AND PHYSICAL EXAM  Referring Provider is Corky Crafts *MD PCP is Hulda Humphrey, NP    Chief Complaint   Patient presents with   .  Mitral Regurgitation       Referral from Dr Eldridge Dace for surgical eval on severe MR and AI, Cardiac Cath on 12/30/11, Echo on 12/21/11   .  Aortic Insuffiency   .  Congestive Heart Failure     HPI:  Patient is an obese 57yo African American female from [place] with reported history of rheumatic fever during childhood and a long h/o heart murmur, who has been followed for several years by Dr Eldridge Dace with chronic diastolic congestive heart failure, mitral regurgitation, and chronic persistent atrial fibrillation.  The patient states that she first began to go downhill more than 10 years ago when she first began to experience chronic exertional shortness of breath. Prior to that she used to work 3 jobs and remain quite active physically. Over the past 10 years she describes slow gradual progression of worsening exertional shortness of breath and fatigue. In April 2012 she was first diagnosed with persistent atrial fibrillation. She apparently underwent cardioversion at that time but rapidly returned to atrial fibrillation. She states that over the past year or 2 symptoms have continued to get worse, and she was hospitalized a month ago with acute exacerbation of chronic diastolic heart failure with acute pulmonary edema. Transthoracic and subsequent transesophageal echocardiograms performed during that hospitalization documented the presence of severe mitral regurgitation. She was treated medically and later underwent cardiac catheterization that revealed normal coronary artery anatomy with no significant coronary artery disease but at least moderate pulmonary hypertension. The patient was referred to consider elective surgical intervention.  The patient currently describes chronic stable symptoms of severe  exertional shortness of breath. She gets short of breath with very mild activity and is now having trouble getting through the day at work.. Over the last few weeks she has not had resting shortness of breath, although she did prior to her hospitalization last month. She describes chronic orthopnea denies any history of PND. She has chronic mild lower extremity edema that is somewhat improved on diuretic therapy. She denies abdominal swelling or bloating. She has never had any chest pain. She denies any palpitations,    Past Medical History  Diagnosis Date  . Asthma   . Hypertension   . Obesity   . Chronic diastolic congestive heart failure   . Carotid bruit   . Hypercholesterolemia   . Polyp of rectum   . Abnormal chest CT   . Persistent atrial fibrillation   . Bronchospasm 1998  . Atrial enlargement, left     severe  . Heart murmur   . Sleep apnea     DOES NOT HAVE CPAP  . Diabetes mellitus     insulin dependent  . Mitral regurgitation   . Tricuspid regurgitation 01/16/2012  . Rheumatic fever 01/16/2012    Reported during childhood  . Atrial fibrillation 01/05/2011    Chronic persistent, failed DCCV   . Obesity (BMI 30-39.9) 01/16/2012  . Shortness of breath     with exertion  . History of blood transfusion     Past Surgical History  Procedure Date  . Cardiovascular stress test 09/2010  . Cardiac catheterization >5 years  . Cardioversion 03/25/2011    Procedure: CARDIOVERSION;  Surgeon: Corky Crafts, MD;  [facility];  Service: Cardiovascular;  Laterality:  N/A;  . Tee without cardioversion 12/21/2011    Procedure: TRANSESOPHAGEAL ECHOCARDIOGRAM (TEE);  Surgeon: Corky Crafts, MD;  Location: [facility] ENDOSCOPY;  Service: Cardiovascular;  Laterality: N/A;  . No past surgeries     Family History  Problem Relation Age of Onset  . Asthma Mother     Social History History  Substance Use Topics  . Smoking status: Never Smoker   . Smokeless tobacco: Never Used    . Alcohol Use: No    Prior to Admission medications   Medication Sig Start Date End Date Taking? Authorizing Provider  albuterol (PROVENTIL HFA;VENTOLIN HFA) 108 (90 BASE) MCG/ACT inhaler Inhale 2 puffs into the lungs every 6 (six) hours as needed.    Historical Provider, MD  amiodarone (PACERONE) 200 MG tablet Take 1 tablet (200 mg total) by mouth 2 (two) times daily. Begin 7 days prior to surgery. 02/06/12   Purcell Nails, MD  calcium carbonate (OS-CAL - DOSED IN MG OF ELEMENTAL CALCIUM) 1250 MG tablet Take 1 tablet by mouth daily.      Historical Provider, MD  Cholecalciferol (VITAMIN D) 1000 UNITS capsule Take 1,000 Units by mouth daily.     Historical Provider, MD  diltiazem (TIAZAC) 360 MG 24 hr capsule Take 1 capsule (360 mg total) by mouth daily. 02/24/11   Hillis Range, MD  DIOVAN 320 MG tablet Take 320 mg by mouth daily. Once daily 10/26/10   Historical Provider, MD  furosemide (LASIX) 40 MG tablet Take 1 tablet (40 mg total) by mouth 2 (two) times daily. 12/21/11   Corky Crafts, MD  insulin glargine (LANTUS SOLOSTAR) 100 UNIT/ML injection Inject 30-35 Units into the skin See admin instructions. Inject 30 units once daily and inject 35 units daily at bedtime.    Historical Provider, MD  LIPITOR 40 MG tablet Take 40 mg by mouth every evening. Once daily 10/20/10   Historical Provider, MD  Nebivolol HCl 20 MG TABS Take 20 mg by mouth daily.  04/04/11   Hillis Range, MD  Omega-3 Fatty Acids (FISH OIL) 1000 MG CAPS Take 1 capsule by mouth daily.     Historical Provider, MD  potassium chloride SA (K-DUR,KLOR-CON) 20 MEQ tablet Take 1 tablet (20 mEq total) by mouth daily. 12/21/11   Corky Crafts, MD  Rivaroxaban (XARELTO) 20 MG TABS Take 1 tablet (20 mg total) by mouth daily. 01/01/12   Corky Crafts, MD  sitaGLIPtan-metformin (JANUMET) 50-1000 MG per tablet Take 1 tablet by mouth daily. 01/01/12   Corky Crafts, MD    No Known Allergies   Review of  Systems:              General:                      normal appetite, decreased energy, no weight gain, no weight loss, no fever             Cardiac:                      no chest pain with exertion, no chest pain at rest, +SOB with mild exertion, occasional resting SOB, no PND, + orthopnea, no palpitations, + arrhythmia, + atrial fibrillation, + LE edema, no dizzy spells, no syncope             Respiratory:                + chronic shortness of breath, no home  oxygen, no productive cough, no dry cough, no bronchitis, + wheezing, no hemoptysis, no asthma, no pain with inspiration or cough, + sleep apnea, does not use CPAP at night             GI:                                no difficulty swallowing, no reflux, no frequent heartburn, no hiatal hernia, no abdominal pain, no constipation, no diarrhea, no hematochezia, no hematemesis, no melena             GU:                              no dysuria,  no frequency, no urinary tract infection, no hematuria, no kidney stones, no kidney disease             Vascular:                     no pain suggestive of claudication, no pain in feet, no leg cramps, no varicose veins, no DVT, no non-healing foot ulcer             Neuro:                         no stroke, no TIA's, no seizures, no headaches, no temporary blindness one eye,  no slurred speech, no peripheral neuropathy, no chronic pain, no instability of gait, no memory/cognitive dysfunction             Musculoskeletal:         no arthritis, no joint swelling, no myalgias, no difficulty walking, normal mobility               Skin:                            no rash, no itching, no skin infections, no pressure sores or ulcerations             Psych:                         no anxiety, no depression, no nervousness, no unusual recent stress             Eyes:                           no blurry vision, no floaters, no recent vision changes, + wears glasses               ENT:                            no  hearing loss, no loose or painful teeth, no dentures, last saw dentist last year - dentist aware of heart murmur             Hematologic:               no easy bruising, no abnormal bleeding, no clotting disorder, no frequent epistaxis             Endocrine:                   + diabetes, does  check CBG's at home                           Physical Exam:              BP 147/93  Pulse 78  Resp 20  Ht 5\' 6"  (1.676 m)  Wt 238 lb (107.956 kg)  BMI 38.41 kg/m2  SpO2 97%             General:                      Obese but o/w  well-appearing             HEENT:                       Unremarkable               Neck:                           no JVD, no bruits, no adenopathy               Chest:                         clear to auscultation, symmetrical breath sounds, no wheezes, no rhonchi               CV:                              RRR, grade III/VI systolic murmur best LSB             Abdomen:                    soft, non-tender, no masses               Extremities:                 warm, well-perfused, pulses not palpable, mild bilateral LE edema             Rectal/GU                   Deferred             Neuro:                         Grossly non-focal and symmetrical throughout             Skin:                            Clean and dry, no rashes, no breakdown   Diagnostic Tests:  Transthoracic Echocardiography  Patient:    Honestii, Marton MR #:       [id number] Study Date: 12/19/2011 Gender:     F Age:        25 Height: Weight: BSA: Pt. Status: Room:       3W41C    Deniece Portela, Traci  PERFORMING   [facility]  SONOGRAPHER  Silvano Bilis, RCS  ATTENDING    Lance Muss cc:  ------------------------------------------------------------ LV EF: 55%  ------------------------------------------------------------ Indications:      CHF - 428.0.  ------------------------------------------------------------  History:   PMH:    Dyspnea.  Atrial fibrillation.  Risk factors:  Hypertension. Diabetes mellitus. Obese. Dyslipidemia.  ------------------------------------------------------------ Study Conclusions  - Left ventricle: The cavity size was normal. There was mild   concentric hypertrophy. The estimated ejection fraction   was 55%. Wall motion was normal; there were no regional   wall motion abnormalities. Left ventricular diastolic   function parameters were normal. - Mitral valve: Moderate thickening of the anterior leaflet,   consistent with myxomatous proliferation. Moderate,   holosystolicprolapse, involving the anterior leaflet.   Moderate to severe regurgitation directed eccentrically   and posteriorly. - Left atrium: The atrium was moderately dilated. - Right ventricle: The cavity size was moderately dilated. - Right atrium: The atrium was mildly dilated. - Atrial septum: No defect or patent foramen ovale was   identified. - Tricuspid valve: Moderate regurgitation. - Pulmonary arteries: PA peak pressure: 63mm Hg (S). Impressions:  - The right ventricular systolic pressure was increased   consistent with moderate pulmonary hypertension. Transthoracic echocardiography.  M-mode, complete 2D, spectral Doppler, and color Doppler.  Blood pressure: 134/86.  Patient status:  Inpatient.  Location:  Bedside.   ------------------------------------------------------------  ------------------------------------------------------------ Left ventricle:  The cavity size was normal. There was mild concentric hypertrophy. The estimated ejection fraction was 55%. Wall motion was normal; there were no regional wall motion abnormalities. The transmitral flow pattern was normal. The deceleration time of the early transmitral flow velocity was normal. The pulmonary vein flow pattern was normal. The tissue Doppler parameters were normal. Left ventricular diastolic function parameters were  normal.  ------------------------------------------------------------ Aortic valve:   Mildly thickened, mildly calcified leaflets.   ------------------------------------------------------------ Aorta:  The aorta was normal, not dilated, and non-diseased.   ------------------------------------------------------------ Mitral valve:   Moderate thickening of the anterior leaflet, consistent with myxomatous proliferation.  Moderate, holosystolicprolapse, involving the anterior leaflet. Doppler:   Moderate to severe regurgitation directed eccentrically and posteriorly.    Mean gradient: 16mm Hg (D). Peak gradient: 35mm Hg (D).  ------------------------------------------------------------ Left atrium:  The atrium was moderately dilated.  ------------------------------------------------------------ Atrial septum:  No defect or patent foramen ovale was identified.  ------------------------------------------------------------ Right ventricle:  The cavity size was moderately dilated.   ------------------------------------------------------------ Pulmonic valve:    Structurally normal valve.   Cusp separation was normal.  Doppler:  Transvalvular velocity was within the normal range.  No regurgitation.  ------------------------------------------------------------ Tricuspid valve:   Structurally normal valve.   Leaflet separation was normal.  Doppler:  Transvalvular velocity was within the normal range.  Moderate regurgitation.  ------------------------------------------------------------ Right atrium:  The atrium was mildly dilated.  ------------------------------------------------------------ Pericardium:  The pericardium was normal in appearance.  ------------------------------------------------------------ Systemic veins: Inferior vena cava: The vessel was dilated; the respirophasic diameter changes were blunted (< 50%); findings are consistent with elevated central  venous pressure.  ------------------------------------------------------------ Post procedure conclusions Ascending Aorta:  - The aorta was normal, not dilated, and non-diseased.  ------------------------------------------------------------  2D measurements        Normal  Doppler measurements   Normal Left ventricle                 Main pulmonary LVID ED,     41.5 mm   43-52   artery chord, PLAX                    Pressure, S    63 mm   =30 LVID ES,     27.1 mm   23-38  Hg chord, PLAX                    Aortic valve FS, chord,     35 %    >29     Regurg vel,   417 cm/s ------ PLAX                           ED LVPW, ED     13.9 mm   ------  Regurg PHT    445 ms   ------ IVS/LVPW     0.91      <1.3    Regurg         70 mm   ------ ratio, ED                      gradient, ED      Hg Ventricular septum             Mitral valve IVS, ED      12.6 mm   ------  Mean vel, D   180 cm/s ------ Aorta                          Mean           16 mm   ------ Root diam,     29 mm   ------  gradient, D       Hg ED                             Peak           35 mm   ------ Left atrium                    gradient, D       Hg AP dim         50 mm   ------  Annulus VTI  53.6 cm   ------                                Tricuspid valve                                Regurg peak   365 cm/s ------                                vel                                Peak RV-RA     53 mm   ------                                gradient, S       Hg                                Systemic veins  Estimated      10 mm   ------                                CVP               Hg                                Right ventricle                                Pressure, S    63 mm   <30                                                  Hg   ------------------------------------------------------------ Prepared and Electronically Authenticated by  Armanda Magic 2013-12-09T16:58:[id number]    Transesophageal Echocardiography  Patient:    Denaisha, Swango MR #:       [id number] Study Date: 12/21/2011 Gender:     F Age:        1 Height: Weight: BSA: Pt. Status: Room:       3W41C    Ethelle Lyon  PERFORMING   [facility]  SONOGRAPHER  Jeryl Columbia  ATTENDING    Lance Muss  [place], [place] cc:  ------------------------------------------------------------ LV EF: 55% -   60%  ------------------------------------------------------------ History:   PMH:  mitral regurgitation  ------------------------------------------------------------ Study Conclusions  - Left ventricle: Systolic function was normal. The   estimated ejection fraction was in the range of 55% to   60%. - Aortic valve: Mild regurgitation. - Mitral valve: Moderate prolapse, involving the anterior   leaflet. Severe regurgitation. - Left atrium: The atrium was mildly to moderately dilated.   No evidence of thrombus in the atrial cavity or appendage. - Right atrium: The atrium was dilated. - Tricuspid valve: Moderate regurgitation. Transesophageal echocardiography.  2D and color Doppler. Patient status:  Inpatient.  Location:  Endoscopy.  ------------------------------------------------------------  ------------------------------------------------------------ Left ventricle:  Systolic function was normal. The estimated ejection fraction was in the range of 55% to 60%.  ------------------------------------------------------------ Aortic valve:   Trileaflet.  Doppler:   There was no stenosis.    Mild regurgitation.  ------------------------------------------------------------ Aorta:  Aortic arch: The aortic arch had no disease.  ------------------------------------------------------------ Mitral valve:   Moderately thickened leaflets .  Moderate prolapse, involving the anterior leaflet.  Doppler:    Severe regurgitation.  ------------------------------------------------------------ Left atrium:  The atrium was mildly to moderately dilated. No evidence of thrombus in the atrial cavity or appendage.   ------------------------------------------------------------ Pulmonary veins: Left upper pulmonary vein:  There was systolic flow reversal.  ------------------------------------------------------------ Pulmonic valve:    The valve appears to be grossly normal.   ------------------------------------------------------------ Tricuspid valve:   Mildly thickened leaflets.  Doppler: Moderate regurgitation.  ------------------------------------------------------------ Right atrium:  The atrium was dilated.  ------------------------------------------------------------ Pericardium:  There was no pericardial effusion.   ------------------------------------------------------------ Prepared and Electronically Authenticated by  Everette Rank 2013-12-11T16:36:[id number]    CARDIAC CATHETERIZATION   PROCEDURE:  Left heart catheterization with selective coronary angiography, left ventriculogram.  Right heart catheterization.  Abdominal aortogram.  INDICATIONS:  Mitral regurgitation, preoperative evaluation  The risks, benefits, and details of the procedure were explained to the patient.  The patient verbalized understanding and wanted to proceed.  Informed written consent was obtained.  PROCEDURE TECHNIQUE:  After Xylocaine anesthesia a 56F sheath was placed in the right femoral artery and a 7Fr in the with a single anterior needle wall stick.   Left coronary angiography was done using a Judkins L4 guide catheter.  Right coronary angiography was done using a Judkins R4 guide catheter.  Left ventriculography was done using a pigtail catheter.      CONTRAST:  Total of 100 cc.  COMPLICATIONS:  None.    HEMODYNAMICS:  Aortic pressure was 117/74; LV pressure was 115/17; LVEDP 26.  There was no  gradient between the left ventricle and aorta.  RA 30/30, RV 60/17, RVEDP 26 mm Hg; PA pressure 56/25, PCWP 35/38, mPCWP 28 mm Hg.  PA sat 43%.  ANGIOGRAPHIC DATA:   The left main coronary artery is angiographically normal.  The left anterior descending artery is angiographically normal.  There is a medium sized diagonal which is widely patent.  The left circumflex artery is angiographically normal.  There is a large ramus vessel which is widely patent.  Large OM1 is angiographically normal.  The right coronary artery is a large dominant vessel which is angiographically normal.  LEFT VENTRICULOGRAM:  Left ventricular angiogram was done in the 30 RAO projection and revealed normal left ventricular wall motion and systolic function with an estimated ejection fraction of 60%.  LVEDP was 26 mmHg.  There is 3+ mitral regurgitation.  ABDOMINAL AORTOGRAM: No AAA.  No renal artery stenosis.  Bilateral single renal arteries appear widely patent.  IMPRESSIONS:    1. Normal left main coronary artery.  2. Normal left anterior descending artery and its branches.  3. Normal left circumflex artery and its branches.  4. Normal right coronary artery.  5. Normal left ventricular systolic function.  LVEDP 26 mmHg.  Ejection fraction 60%. 6.  Moderate pulmonary hypertension.  CI 1.7.   Impression:   Patient has severe symptomatic mitral regurgitation with slow gradual progression of chronic diastolic congestive heart failure that is further complicated by the presence of chronic persistent atrial fibrillation, moderate tricuspid regurgitation, mild aortic insufficiency and obesity. The patient has reported history of rheumatic fever during childhood, and functional anatomy of the mitral valve may be consistent with underlying rheumatic disease due to severe restriction of the posterior leaflet causing overriding or pseudo-prolapse of the anterior leaflet with posteriorly directed jet of regurgitation coursing  around the severely dilated left atrium. There remains a possibility that the valve could be repairable as the anterior leaflet is only mildly thickened and appears reasonably mobile. However, there remains a significant possibility that the valve would need to be replaced. Risks of surgery will be somewhat increased do to the patient's obesity and fairly significant chronic shortness of breath. She might benefit from concomitant Maze procedure. Tricuspid valve repair may be indicated as well depending upon severity of tricuspid regurgitation and/or dilatation of the tricuspid annulus.    Plan:   I spent in excess of 30 minutes with the patient and her husband discussing the indications, risks, and potential benefits of surgery.  The rationale for elective mitral valve surgery has been explained, including a comparison between surgery and continued medical therapy with close follow-up. The likelihood of successful and durable valve repair has been discussed with particular reference to the findings of their recent echocardiogram.  Based upon these findings and previous experience, I have quoted them a 50 percent likelihood of successful valve repair. In the event that their valve cannot be successfully repaired, we discussed the possibility of replacing the mitral valve using a mechanical prosthesis with the attendant need for long-term anticoagulation versus the alternative of replacing it using a bioprosthetic tissue valve with its potential for late structural valve deterioration and failure, depending upon the patient's longevity. Because of the patient's relatively young age I would tend to favor use of a mechanical prosthesis if the mitral valve cannot be repaired. Alternative surgical approaches have been discussed, including a comparison between conventional sternotomy and minimally-invasive techniques. The relative risks and benefits of each have been reviewed as they pertain to the patient's specific  circumstances, and all of their questions have been addressed. Because of her morbid obesity and significant pulmonary hypertension with signs of worsening right ventricular dysfunction, I feel that conventional sternotomy might be more prudent under the circumstances.  The patient and her husband understand and accept all potential associated risks of surgery including but not limited to risk of death, stroke, myocardial infarction, congestive heart failure, respiratory failure, renal failure, bleeding requiring blood transfusion and/or reexploration, arrhythmia, heart block or bradycardia requiring permanent pacemaker, pneumonia, pleural effusion, wound infection, pulmonary embolus or other thromboembolic complication, chronic pain or other delayed complications including possible late complications related to valve repair or replacement.  All questions answered.  We plan to proceed with surgery on Wednesday, February 5. I have given the patient a prescription for amiodarone to begin one week prior to surgery. She has been instructed to stop taking Xarelto after she takes her dose this Friday.      Salvatore Decent. Cornelius Moras, MD

## 2012-02-15 ENCOUNTER — Inpatient Hospital Stay (HOSPITAL_COMMUNITY): Payer: BC Managed Care – PPO | Admitting: Vascular Surgery

## 2012-02-15 ENCOUNTER — Encounter (HOSPITAL_COMMUNITY)
Admission: RE | Disposition: A | Discharge status: Home / Self Care | Payer: Self-pay | Source: Ambulatory Visit | Attending: Thoracic Surgery (Cardiothoracic Vascular Surgery)

## 2012-02-15 ENCOUNTER — Inpatient Hospital Stay (HOSPITAL_COMMUNITY): Payer: BC Managed Care – PPO

## 2012-02-15 ENCOUNTER — Encounter (HOSPITAL_COMMUNITY): Payer: Self-pay | Admitting: Thoracic Surgery (Cardiothoracic Vascular Surgery)

## 2012-02-15 ENCOUNTER — Encounter (HOSPITAL_COMMUNITY): Payer: Self-pay | Admitting: Vascular Surgery

## 2012-02-15 ENCOUNTER — Inpatient Hospital Stay (HOSPITAL_COMMUNITY)
Admission: RE | Admit: 2012-02-15 | Discharge: 2012-02-25 | DRG: 545 | Disposition: A | Discharge status: Home / Self Care | Payer: BC Managed Care – PPO | Source: Ambulatory Visit | Attending: Thoracic Surgery (Cardiothoracic Vascular Surgery) | Admitting: Thoracic Surgery (Cardiothoracic Vascular Surgery)

## 2012-02-15 ENCOUNTER — Encounter (HOSPITAL_COMMUNITY): Payer: Self-pay | Admitting: *Deleted

## 2012-02-15 DIAGNOSIS — G4730 Sleep apnea, unspecified: Secondary | ICD-10-CM | POA: Diagnosis present

## 2012-02-15 DIAGNOSIS — J40 Bronchitis, not specified as acute or chronic: Secondary | ICD-10-CM | POA: Diagnosis present

## 2012-02-15 DIAGNOSIS — I509 Heart failure, unspecified: Secondary | ICD-10-CM | POA: Diagnosis present

## 2012-02-15 DIAGNOSIS — I498 Other specified cardiac arrhythmias: Secondary | ICD-10-CM | POA: Diagnosis not present

## 2012-02-15 DIAGNOSIS — I5033 Acute on chronic diastolic (congestive) heart failure: Secondary | ICD-10-CM | POA: Diagnosis not present

## 2012-02-15 DIAGNOSIS — Z7901 Long term (current) use of anticoagulants: Secondary | ICD-10-CM

## 2012-02-15 DIAGNOSIS — E119 Type 2 diabetes mellitus without complications: Secondary | ICD-10-CM | POA: Diagnosis present

## 2012-02-15 DIAGNOSIS — I079 Rheumatic tricuspid valve disease, unspecified: Secondary | ICD-10-CM

## 2012-02-15 DIAGNOSIS — D62 Acute posthemorrhagic anemia: Secondary | ICD-10-CM | POA: Diagnosis not present

## 2012-02-15 DIAGNOSIS — J45909 Unspecified asthma, uncomplicated: Secondary | ICD-10-CM | POA: Diagnosis present

## 2012-02-15 DIAGNOSIS — Z952 Presence of prosthetic heart valve: Secondary | ICD-10-CM

## 2012-02-15 DIAGNOSIS — Z860100 Personal history of colon polyps, unspecified: Secondary | ICD-10-CM

## 2012-02-15 DIAGNOSIS — I10 Essential (primary) hypertension: Secondary | ICD-10-CM | POA: Diagnosis present

## 2012-02-15 DIAGNOSIS — E7800 Pure hypercholesterolemia, unspecified: Secondary | ICD-10-CM | POA: Diagnosis present

## 2012-02-15 DIAGNOSIS — Z794 Long term (current) use of insulin: Secondary | ICD-10-CM

## 2012-02-15 DIAGNOSIS — E669 Obesity, unspecified: Secondary | ICD-10-CM | POA: Diagnosis present

## 2012-02-15 DIAGNOSIS — I059 Rheumatic mitral valve disease, unspecified: Secondary | ICD-10-CM

## 2012-02-15 DIAGNOSIS — Z9889 Other specified postprocedural states: Secondary | ICD-10-CM

## 2012-02-15 DIAGNOSIS — I071 Rheumatic tricuspid insufficiency: Secondary | ICD-10-CM

## 2012-02-15 DIAGNOSIS — Z23 Encounter for immunization: Secondary | ICD-10-CM

## 2012-02-15 DIAGNOSIS — I4891 Unspecified atrial fibrillation: Secondary | ICD-10-CM

## 2012-02-15 DIAGNOSIS — Z954 Presence of other heart-valve replacement: Secondary | ICD-10-CM

## 2012-02-15 DIAGNOSIS — R5381 Other malaise: Secondary | ICD-10-CM | POA: Diagnosis not present

## 2012-02-15 DIAGNOSIS — Z6830 Body mass index (BMI) 30.0-30.9, adult: Secondary | ICD-10-CM

## 2012-02-15 DIAGNOSIS — Z8679 Personal history of other diseases of the circulatory system: Secondary | ICD-10-CM

## 2012-02-15 DIAGNOSIS — E876 Hypokalemia: Secondary | ICD-10-CM | POA: Diagnosis not present

## 2012-02-15 DIAGNOSIS — I080 Rheumatic disorders of both mitral and aortic valves: Principal | ICD-10-CM | POA: Diagnosis present

## 2012-02-15 HISTORY — DX: Personal history of other diseases of the circulatory system: Z86.79

## 2012-02-15 HISTORY — DX: Other specified postprocedural states: Z98.890

## 2012-02-15 HISTORY — PX: MAZE: SHX5063

## 2012-02-15 HISTORY — PX: INTRAOPERATIVE TRANSESOPHAGEAL ECHOCARDIOGRAM: SHX5062

## 2012-02-15 HISTORY — DX: Presence of other heart-valve replacement: Z95.4

## 2012-02-15 HISTORY — PX: TRICUSPID VALVE REPLACEMENT: SHX816

## 2012-02-15 HISTORY — PX: MITRAL VALVE REPLACEMENT: SHX147

## 2012-02-15 LAB — POCT I-STAT 3, ART BLOOD GAS (G3+)
Bicarbonate: 22.6 mEq/L (ref 20.0–24.0)
Bicarbonate: 25 mEq/L — ABNORMAL HIGH (ref 20.0–24.0)
O2 Saturation: 99 %
O2 Saturation: 91 %
TCO2: 26 mmol/L (ref 0–100)
pCO2 arterial: 30.1 mmHg — ABNORMAL LOW (ref 35.0–45.0)
pCO2 arterial: 36.7 mmHg (ref 35.0–45.0)
pCO2 arterial: 45.5 mmHg — ABNORMAL HIGH (ref 35.0–45.0)
pH, Arterial: 7.358 (ref 7.350–7.450)
pO2, Arterial: 162 mmHg — ABNORMAL HIGH (ref 80.0–100.0)
pO2, Arterial: 62 mmHg — ABNORMAL LOW (ref 80.0–100.0)

## 2012-02-15 LAB — PROTIME-INR
INR: 1.46 (ref 0.00–1.49)
Prothrombin Time: 17.3 seconds — ABNORMAL HIGH (ref 11.6–15.2)

## 2012-02-15 LAB — POCT I-STAT 4, (NA,K, GLUC, HGB,HCT)
Glucose, Bld: 139 mg/dL — ABNORMAL HIGH (ref 70–99)
Glucose, Bld: 153 mg/dL — ABNORMAL HIGH (ref 70–99)
Glucose, Bld: 163 mg/dL — ABNORMAL HIGH (ref 70–99)
Glucose, Bld: 165 mg/dL — ABNORMAL HIGH (ref 70–99)
HCT: 36 % (ref 36.0–46.0)
HCT: 28 % — ABNORMAL LOW (ref 36.0–46.0)
HCT: 34 % — ABNORMAL LOW (ref 36.0–46.0)
HCT: 26 % — ABNORMAL LOW (ref 36.0–46.0)
HCT: 26 % — ABNORMAL LOW (ref 36.0–46.0)
Hemoglobin: 12.2 g/dL (ref 12.0–15.0)
Hemoglobin: 11.6 g/dL — ABNORMAL LOW (ref 12.0–15.0)
Hemoglobin: 8.2 g/dL — ABNORMAL LOW (ref 12.0–15.0)
Potassium: 4.2 mEq/L (ref 3.5–5.1)
Potassium: 4 mEq/L (ref 3.5–5.1)
Potassium: 4 mEq/L (ref 3.5–5.1)
Potassium: 4.6 mEq/L (ref 3.5–5.1)
Sodium: 137 mEq/L (ref 135–145)
Sodium: 138 mEq/L (ref 135–145)
Sodium: 138 mEq/L (ref 135–145)

## 2012-02-15 LAB — GLUCOSE, CAPILLARY: Glucose-Capillary: 195 mg/dL — ABNORMAL HIGH (ref 70–99)

## 2012-02-15 LAB — CBC
Platelets: 120 10*3/uL — ABNORMAL LOW (ref 150–400)
Platelets: 113 10*3/uL — ABNORMAL LOW (ref 150–400)
RBC: 3.41 MIL/uL — ABNORMAL LOW (ref 3.87–5.11)
RDW: 13.1 % (ref 11.5–15.5)
RDW: 12.8 % (ref 11.5–15.5)
WBC: 8.9 10*3/uL (ref 4.0–10.5)
WBC: 8.2 10*3/uL (ref 4.0–10.5)

## 2012-02-15 LAB — CREATININE, SERUM
Creatinine, Ser: 0.6 mg/dL (ref 0.50–1.10)
GFR calc non Af Amer: >90 mL/min (ref >90)

## 2012-02-15 LAB — APTT: aPTT: 41 seconds — ABNORMAL HIGH (ref 24–37)

## 2012-02-15 LAB — MAGNESIUM: Magnesium: 3 mg/dL — ABNORMAL HIGH (ref 1.5–2.5)

## 2012-02-15 SURGERY — TRICUSPID VALVE REPAIR
Anesthesia: General | Site: Chest | Wound class: Clean

## 2012-02-15 MED ORDER — MORPHINE SULFATE 2 MG/ML IJ SOLN
1.0000 mg | INTRAMUSCULAR | Status: AC | PRN
  Filled 2012-02-15: qty 1

## 2012-02-15 MED ORDER — METOPROLOL TARTRATE 12.5 MG HALF TABLET
12.5000 mg | ORAL_TABLET | Freq: Two times a day (BID) | ORAL | Status: DC
  Administered 2012-02-16 – 2012-02-18 (×2): 12.5 mg via ORAL
  Filled 2012-02-15 (×9): qty 1

## 2012-02-15 MED ORDER — FENTANYL CITRATE 0.05 MG/ML IJ SOLN
INTRAMUSCULAR | Status: DC | PRN
  Administered 2012-02-15: 250 ug via INTRAVENOUS
  Administered 2012-02-15: 150 ug via INTRAVENOUS
  Administered 2012-02-15 (×2): 250 ug via INTRAVENOUS
  Administered 2012-02-15: 50 ug via INTRAVENOUS
  Administered 2012-02-15 (×2): 250 ug via INTRAVENOUS
  Administered 2012-02-15: 50 ug via INTRAVENOUS
  Administered 2012-02-15: 250 ug via INTRAVENOUS

## 2012-02-15 MED ORDER — PROPOFOL 10 MG/ML IV BOLUS
INTRAVENOUS | Status: DC | PRN
  Administered 2012-02-15: 90 mg via INTRAVENOUS

## 2012-02-15 MED ORDER — VECURONIUM BROMIDE 10 MG IV SOLR
INTRAVENOUS | Status: DC | PRN
  Administered 2012-02-15 (×2): 10 mg via INTRAVENOUS

## 2012-02-15 MED ORDER — ONDANSETRON HCL 4 MG/2ML IJ SOLN: 4.0000 mg | Freq: Four times a day (QID) | INTRAMUSCULAR | Status: DC | PRN

## 2012-02-15 MED ORDER — LACTATED RINGERS IV SOLN: INTRAVENOUS | Status: DC

## 2012-02-15 MED ORDER — ASPIRIN EC 325 MG PO TBEC
325.0000 mg | DELAYED_RELEASE_TABLET | Freq: Every day | ORAL | Status: DC
  Administered 2012-02-16: 325 mg via ORAL
  Filled 2012-02-15 (×2): qty 1

## 2012-02-15 MED ORDER — SUCCINYLCHOLINE CHLORIDE 20 MG/ML IJ SOLN
INTRAMUSCULAR | Status: DC | PRN
  Administered 2012-02-15: 150 mg via INTRAVENOUS

## 2012-02-15 MED ORDER — ASPIRIN 81 MG PO CHEW: 324.0000 mg | CHEWABLE_TABLET | Freq: Every day | ORAL | Status: DC

## 2012-02-15 MED ORDER — DEXTROSE 5 % IV SOLN
1.5000 g | Freq: Two times a day (BID) | INTRAVENOUS | Status: DC
  Administered 2012-02-15 – 2012-02-16 (×3): 1.5 g via INTRAVENOUS
  Filled 2012-02-15 (×4): qty 1.5

## 2012-02-15 MED ORDER — ACETAMINOPHEN 10 MG/ML IV SOLN
1000.0000 mg | Freq: Once | INTRAVENOUS | Status: AC
  Administered 2012-02-15: 1000 mg via INTRAVENOUS
  Filled 2012-02-15: qty 100

## 2012-02-15 MED ORDER — LIDOCAINE HCL (CARDIAC) 20 MG/ML IV SOLN
INTRAVENOUS | Status: DC | PRN
  Administered 2012-02-15: 100 mg via INTRAVENOUS

## 2012-02-15 MED ORDER — POTASSIUM CHLORIDE 10 MEQ/50ML IV SOLN
10.0000 meq | INTRAVENOUS | Status: AC
  Administered 2012-02-15 (×3): 10 meq via INTRAVENOUS

## 2012-02-15 MED ORDER — NITROGLYCERIN IN D5W 200-5 MCG/ML-% IV SOLN: 0.0000 ug/min | INTRAVENOUS | Status: DC

## 2012-02-15 MED ORDER — DOCUSATE SODIUM 100 MG PO CAPS
200.0000 mg | ORAL_CAPSULE | Freq: Every day | ORAL | Status: DC
  Administered 2012-02-16 – 2012-02-24 (×9): 200 mg via ORAL
  Filled 2012-02-15 (×10): qty 2

## 2012-02-15 MED ORDER — DOPAMINE-DEXTROSE 3.2-5 MG/ML-% IV SOLN: 0.0000 ug/kg/min | INTRAVENOUS | Status: DC

## 2012-02-15 MED ORDER — LACTATED RINGERS IV SOLN
INTRAVENOUS | Status: DC | PRN
  Administered 2012-02-15: 08:00:00 via INTRAVENOUS

## 2012-02-15 MED ORDER — BISACODYL 10 MG RE SUPP: 10.0000 mg | Freq: Every day | RECTAL | Status: DC

## 2012-02-15 MED ORDER — CALCIUM CHLORIDE 10 % IV SOLN
1.0000 g | Freq: Once | INTRAVENOUS | Status: AC | PRN
  Filled 2012-02-15: qty 10

## 2012-02-15 MED ORDER — ROCURONIUM BROMIDE 100 MG/10ML IV SOLN
INTRAVENOUS | Status: DC | PRN
  Administered 2012-02-15 (×2): 50 mg via INTRAVENOUS

## 2012-02-15 MED ORDER — MIDAZOLAM HCL 5 MG/5ML IJ SOLN
INTRAMUSCULAR | Status: DC | PRN
  Administered 2012-02-15 (×2): 2 mg via INTRAVENOUS
  Administered 2012-02-15: 3 mg via INTRAVENOUS
  Administered 2012-02-15: 1 mg via INTRAVENOUS
  Administered 2012-02-15: 2 mg via INTRAVENOUS

## 2012-02-15 MED ORDER — MIDAZOLAM HCL 2 MG/2ML IJ SOLN: 2.0000 mg | INTRAMUSCULAR | Status: DC | PRN

## 2012-02-15 MED ORDER — MILRINONE IN DEXTROSE 20 MG/100ML IV SOLN
0.0000 ug/kg/min | INTRAVENOUS | Status: DC
  Administered 2012-02-16: 0.2 ug/kg/min via INTRAVENOUS
  Filled 2012-02-15: qty 100

## 2012-02-15 MED ORDER — INSULIN REGULAR BOLUS VIA INFUSION
0.0000 [IU] | Freq: Three times a day (TID) | INTRAVENOUS | Status: DC
  Administered 2012-02-16: 6 [IU] via INTRAVENOUS
  Filled 2012-02-15: qty 10

## 2012-02-15 MED ORDER — PHENYLEPHRINE HCL 10 MG/ML IJ SOLN
0.0000 ug/min | INTRAMUSCULAR | Status: DC
  Filled 2012-02-15: qty 2

## 2012-02-15 MED ORDER — MORPHINE SULFATE 2 MG/ML IJ SOLN
2.0000 mg | INTRAMUSCULAR | Status: DC | PRN
  Administered 2012-02-16 (×2): 2 mg via INTRAVENOUS
  Filled 2012-02-15: qty 1

## 2012-02-15 MED ORDER — ALBUTEROL SULFATE HFA 108 (90 BASE) MCG/ACT IN AERS
INHALATION_SPRAY | RESPIRATORY_TRACT | Status: DC | PRN
  Administered 2012-02-15: 2 via RESPIRATORY_TRACT

## 2012-02-15 MED ORDER — ALBUMIN HUMAN 5 % IV SOLN
250.0000 mL | INTRAVENOUS | Status: AC | PRN
  Administered 2012-02-15 (×3): 250 mL via INTRAVENOUS
  Filled 2012-02-15: qty 500

## 2012-02-15 MED ORDER — SODIUM CHLORIDE 0.9 % IJ SOLN
OROMUCOSAL | Status: DC | PRN
  Administered 2012-02-15 (×6): via TOPICAL

## 2012-02-15 MED ORDER — METOPROLOL TARTRATE 25 MG/10 ML ORAL SUSPENSION
12.5000 mg | Freq: Two times a day (BID) | ORAL | Status: DC
  Filled 2012-02-15 (×3): qty 5

## 2012-02-15 MED ORDER — BISACODYL 5 MG PO TBEC
10.0000 mg | DELAYED_RELEASE_TABLET | Freq: Every day | ORAL | Status: DC
  Administered 2012-02-16 – 2012-02-21 (×6): 10 mg via ORAL
  Filled 2012-02-15 (×7): qty 2

## 2012-02-15 MED ORDER — MILRINONE IN DEXTROSE 20 MG/100ML IV SOLN
0.1250 ug/kg/min | INTRAVENOUS | Status: AC
  Administered 2012-02-15: .2 ug/kg/min via INTRAVENOUS
  Filled 2012-02-15: qty 100

## 2012-02-15 MED ORDER — DEXMEDETOMIDINE HCL IN NACL 200 MCG/50ML IV SOLN: 0.1000 ug/kg/h | INTRAVENOUS | Status: DC

## 2012-02-15 MED ORDER — DEXMEDETOMIDINE HCL IN NACL 200 MCG/50ML IV SOLN
0.4000 ug/kg/h | INTRAVENOUS | Status: DC
  Filled 2012-02-15: qty 50

## 2012-02-15 MED ORDER — INSULIN REGULAR HUMAN 100 UNIT/ML IJ SOLN
INTRAMUSCULAR | Status: DC
  Administered 2012-02-16: 0.6 [IU]/h via INTRAVENOUS
  Filled 2012-02-15: qty 1

## 2012-02-15 MED ORDER — ACETAMINOPHEN 160 MG/5ML PO SOLN
975.0000 mg | Freq: Four times a day (QID) | ORAL | Status: DC
  Administered 2012-02-16: 975 mg
  Filled 2012-02-15: qty 40.6

## 2012-02-15 MED ORDER — OXYCODONE HCL 5 MG PO TABS
5.0000 mg | ORAL_TABLET | ORAL | Status: DC | PRN
  Administered 2012-02-16 – 2012-02-17 (×5): 5 mg via ORAL
  Administered 2012-02-17 – 2012-02-18 (×5): 10 mg via ORAL
  Administered 2012-02-19 – 2012-02-20 (×6): 5 mg via ORAL
  Administered 2012-02-20 – 2012-02-23 (×9): 10 mg via ORAL
  Administered 2012-02-23: 5 mg via ORAL
  Administered 2012-02-23 – 2012-02-24 (×3): 10 mg via ORAL
  Administered 2012-02-24: 5 mg via ORAL
  Administered 2012-02-25: 10 mg via ORAL
  Filled 2012-02-15: qty 1
  Filled 2012-02-15 (×2): qty 2
  Filled 2012-02-15: qty 1
  Filled 2012-02-15 (×2): qty 2
  Filled 2012-02-15: qty 1
  Filled 2012-02-15: qty 2
  Filled 2012-02-15 (×4): qty 1
  Filled 2012-02-15 (×4): qty 2
  Filled 2012-02-15 (×3): qty 1
  Filled 2012-02-15 (×2): qty 2
  Filled 2012-02-15: qty 1
  Filled 2012-02-15 (×2): qty 2
  Filled 2012-02-15: qty 1
  Filled 2012-02-15 (×6): qty 2

## 2012-02-15 MED ORDER — PROTAMINE SULFATE 10 MG/ML IV SOLN
INTRAVENOUS | Status: DC | PRN
  Administered 2012-02-15 (×3): 50 mg via INTRAVENOUS
  Administered 2012-02-15: 30 mg via INTRAVENOUS
  Administered 2012-02-15 (×2): 50 mg via INTRAVENOUS

## 2012-02-15 MED ORDER — MAGNESIUM SULFATE 40 MG/ML IJ SOLN
4.0000 g | Freq: Once | INTRAMUSCULAR | Status: AC
  Administered 2012-02-15: 4 g via INTRAVENOUS
  Filled 2012-02-15: qty 100

## 2012-02-15 MED ORDER — PANTOPRAZOLE SODIUM 40 MG PO TBEC
40.0000 mg | DELAYED_RELEASE_TABLET | Freq: Every day | ORAL | Status: DC
  Administered 2012-02-17 – 2012-02-25 (×9): 40 mg via ORAL
  Filled 2012-02-15 (×9): qty 1

## 2012-02-15 MED ORDER — SODIUM CHLORIDE 0.9 % IR SOLN
Status: DC | PRN
  Administered 2012-02-15: 6000 mL

## 2012-02-15 MED ORDER — METOPROLOL TARTRATE 12.5 MG HALF TABLET: 12.5000 mg | ORAL_TABLET | Freq: Once | ORAL | Status: DC

## 2012-02-15 MED ORDER — METOPROLOL TARTRATE 1 MG/ML IV SOLN: 2.5000 mg | INTRAVENOUS | Status: DC | PRN

## 2012-02-15 MED ORDER — SODIUM CHLORIDE 0.9 % IJ SOLN
3.0000 mL | Freq: Two times a day (BID) | INTRAMUSCULAR | Status: DC
  Administered 2012-02-16 (×2): 3 mL via INTRAVENOUS

## 2012-02-15 MED ORDER — ARTIFICIAL TEARS OP OINT
TOPICAL_OINTMENT | OPHTHALMIC | Status: DC | PRN
  Administered 2012-02-15: 1 via OPHTHALMIC

## 2012-02-15 MED ORDER — AMINOCAPROIC ACID 250 MG/ML IV SOLN
0.5000 g/h | INTRAVENOUS | Status: DC
  Filled 2012-02-15: qty 20

## 2012-02-15 MED ORDER — SODIUM CHLORIDE 0.9 % IV SOLN: INTRAVENOUS | Status: DC

## 2012-02-15 MED ORDER — VANCOMYCIN HCL IN DEXTROSE 1-5 GM/200ML-% IV SOLN
1000.0000 mg | Freq: Once | INTRAVENOUS | Status: AC
  Administered 2012-02-15: 1000 mg via INTRAVENOUS
  Filled 2012-02-15: qty 200

## 2012-02-15 MED ORDER — CHLORHEXIDINE GLUCONATE 4 % EX LIQD: 30.0000 mL | CUTANEOUS | Status: DC

## 2012-02-15 MED ORDER — SODIUM CHLORIDE 0.9 % IJ SOLN: 3.0000 mL | INTRAMUSCULAR | Status: DC | PRN

## 2012-02-15 MED ORDER — ACETAMINOPHEN 500 MG PO TABS
1000.0000 mg | ORAL_TABLET | Freq: Four times a day (QID) | ORAL | Status: AC
  Administered 2012-02-16 – 2012-02-20 (×18): 1000 mg via ORAL
  Filled 2012-02-15 (×18): qty 2

## 2012-02-15 MED ORDER — FAMOTIDINE IN NACL 20-0.9 MG/50ML-% IV SOLN
20.0000 mg | Freq: Two times a day (BID) | INTRAVENOUS | Status: AC
  Administered 2012-02-15 (×2): 20 mg via INTRAVENOUS
  Filled 2012-02-15: qty 50

## 2012-02-15 MED ORDER — SODIUM CHLORIDE 0.9 % IV SOLN: 250.0000 mL | INTRAVENOUS | Status: DC | PRN

## 2012-02-15 MED ORDER — HEPARIN SODIUM (PORCINE) 1000 UNIT/ML IJ SOLN
INTRAMUSCULAR | Status: DC | PRN
  Administered 2012-02-15: 31000 [IU] via INTRAVENOUS

## 2012-02-15 MED ORDER — SODIUM CHLORIDE 0.45 % IV SOLN: INTRAVENOUS | Status: DC

## 2012-02-15 SURGICAL SUPPLY — 105 items
ADAPTER CARDIO PERF ANTE/RETRO (ADAPTER) ×6 IMPLANT
ADPR PRFSN 84XANTGRD RTRGD (ADAPTER) ×4
APPLICATOR COTTON TIP 6IN STRL (MISCELLANEOUS) IMPLANT
ATTRACTOMAT 16X20 MAGNETIC DRP (DRAPES) ×6 IMPLANT
BAG DECANTER FOR FLEXI CONT (MISCELLANEOUS) ×6 IMPLANT
BLADE STERNUM SYSTEM 6 (BLADE) ×6 IMPLANT
BLADE SURG 11 STRL SS (BLADE) ×6 IMPLANT
BOOT SUTURE AID YELLOW STND (SUTURE) ×1 IMPLANT
CANISTER SUCTION 2500CC (MISCELLANEOUS) ×6 IMPLANT
CANN PRFSN 3/8X14X24FR PCFC (MISCELLANEOUS)
CANN PRFSN 3/8XCNCT ST RT ANG (MISCELLANEOUS)
CANNULA GUNDRY RCSP 15FR (MISCELLANEOUS) ×3 IMPLANT
CANNULA PRFSN 3/8X14X24FR PCFC (MISCELLANEOUS) IMPLANT
CANNULA PRFSN 3/8XCNCT RT ANG (MISCELLANEOUS) IMPLANT
CANNULA VEN MTL TIP RT (MISCELLANEOUS)
CARDIOBLATE CARDIAC ABLATION (MISCELLANEOUS)
CATH FOLEY 2WAY SLVR  5CC 14FR (CATHETERS)
CATH FOLEY 2WAY SLVR 5CC 14FR (CATHETERS) IMPLANT
CATH ROBINSON RED A/P 18FR (CATHETERS) ×1 IMPLANT
CATH THORACIC 28FR RT ANG (CATHETERS) IMPLANT
CATH THORACIC 36FR (CATHETERS) IMPLANT
CLAMP ISOLATOR SYNERGY LG (MISCELLANEOUS) ×5 IMPLANT
CLIP FOGARTY SPRING 6M (CLIP) IMPLANT
CLOTH BEACON ORANGE TIMEOUT ST (SAFETY) ×6 IMPLANT
CONN 1/2X1/2X1/2  BEN (MISCELLANEOUS) ×3
CONN 1/2X1/2X1/2 BEN (MISCELLANEOUS) ×4 IMPLANT
CONN 3/8X1/2 ST GISH (MISCELLANEOUS) ×12 IMPLANT
CONN ST 1/4X3/8  BEN (MISCELLANEOUS) ×1
CONN ST 1/4X3/8 BEN (MISCELLANEOUS) IMPLANT
CONNECTOR 1/2X3/8X1/2 3 WAY (MISCELLANEOUS) ×1
CONNECTOR 1/2X3/8X1/2 3WAY (MISCELLANEOUS) IMPLANT
COVER SURGICAL LIGHT HANDLE (MISCELLANEOUS) ×12 IMPLANT
CRADLE DONUT ADULT HEAD (MISCELLANEOUS) ×6 IMPLANT
DEVICE CARDIOBLATE CARDIAC ABL (MISCELLANEOUS) IMPLANT
DRAIN CHANNEL 32F RND 10.7 FF (WOUND CARE) ×3 IMPLANT
DRAPE CARDIOVASCULAR INCISE (DRAPES) ×3
DRAPE SLUSH/WARMER DISC (DRAPES) ×3 IMPLANT
DRAPE SRG 135X102X78XABS (DRAPES) ×2 IMPLANT
DRSG COVADERM 4X14 (GAUZE/BANDAGES/DRESSINGS) ×6 IMPLANT
ELECT REM PT RETURN 9FT ADLT (ELECTROSURGICAL) ×12
ELECTRODE REM PT RTRN 9FT ADLT (ELECTROSURGICAL) ×8 IMPLANT
GLOVE BIO SURGEON STRL SZ 6 (GLOVE) IMPLANT
GLOVE BIO SURGEON STRL SZ 6.5 (GLOVE) IMPLANT
GLOVE BIO SURGEON STRL SZ7 (GLOVE) IMPLANT
GLOVE BIO SURGEON STRL SZ7.5 (GLOVE) IMPLANT
GLOVE ORTHO TXT STRL SZ7.5 (GLOVE) ×6 IMPLANT
GOWN STRL NON-REIN LRG LVL3 (GOWN DISPOSABLE) ×24 IMPLANT
HEMOSTAT POWDER SURGIFOAM 1G (HEMOSTASIS) ×15 IMPLANT
INSERT FOGARTY XLG (MISCELLANEOUS) ×3 IMPLANT
KIT BASIN OR (CUSTOM PROCEDURE TRAY) ×6 IMPLANT
KIT DILATOR VASC 18G NDL (KITS) ×1 IMPLANT
KIT PAIN CUSTOM (MISCELLANEOUS) IMPLANT
KIT ROOM TURNOVER OR (KITS) ×6 IMPLANT
KIT SUCTION CATH 14FR (SUCTIONS) ×14 IMPLANT
LINE VENT (MISCELLANEOUS) ×2 IMPLANT
LOOP VESSEL SUPERMAXI WHITE (MISCELLANEOUS) ×3 IMPLANT
MARKER GRAFT CORONARY BYPASS (MISCELLANEOUS) IMPLANT
NS IRRIG 1000ML POUR BTL (IV SOLUTION) ×27 IMPLANT
PACK OPEN HEART (CUSTOM PROCEDURE TRAY) ×6 IMPLANT
PAD ARMBOARD 7.5X6 YLW CONV (MISCELLANEOUS) ×12 IMPLANT
PROBE CRYO2-ABLATION MALLABLE (MISCELLANEOUS) ×2 IMPLANT
SET CARDIOPLEGIA MPS 5001102 (MISCELLANEOUS) ×1 IMPLANT
SET IRRIG TUBING LAPAROSCOPIC (IRRIGATION / IRRIGATOR) ×3 IMPLANT
SPONGE GAUZE 4X4 12PLY (GAUZE/BANDAGES/DRESSINGS) ×12 IMPLANT
SPONGE LAP 4X18 X RAY DECT (DISPOSABLE) ×1 IMPLANT
SUCKER INTRACARDIAC WEIGHTED (SUCKER) ×6 IMPLANT
SUT BONE WAX W31G (SUTURE) ×3 IMPLANT
SUT ETHIBOND (SUTURE) ×1 IMPLANT
SUT ETHIBOND 2 0 SH (SUTURE) ×15 IMPLANT
SUT ETHIBOND 2 0 SH 36X2 (SUTURE) ×8 IMPLANT
SUT ETHIBOND 2 0 V4 (SUTURE) IMPLANT
SUT ETHIBOND 2 0V4 GREEN (SUTURE) IMPLANT
SUT ETHIBOND 2-0 RB-1 WHT (SUTURE) ×2 IMPLANT
SUT ETHIBOND 4 0 TF (SUTURE) IMPLANT
SUT ETHIBOND 5 0 C 1 30 (SUTURE) ×6 IMPLANT
SUT ETHIBOND NAB MH 2-0 36IN (SUTURE) ×1 IMPLANT
SUT ETHIBOND X763 2 0 SH 1 (SUTURE) ×7 IMPLANT
SUT MNCRL AB 3-0 PS2 18 (SUTURE) ×6 IMPLANT
SUT PDS AB 1 CTX 36 (SUTURE) ×6 IMPLANT
SUT PROLENE 3 0 SH 1 (SUTURE) ×6 IMPLANT
SUT PROLENE 3 0 SH DA (SUTURE) ×3 IMPLANT
SUT PROLENE 4 0 RB 1 (SUTURE) ×21
SUT PROLENE 4 0 SH DA (SUTURE) ×12 IMPLANT
SUT PROLENE 4-0 RB1 .5 CRCL 36 (SUTURE) ×8 IMPLANT
SUT SILK  1 MH (SUTURE) ×1
SUT SILK 1 MH (SUTURE) ×2 IMPLANT
SUT STEEL 6MS V (SUTURE) IMPLANT
SUT STEEL STERNAL CCS#1 18IN (SUTURE) IMPLANT
SUT STEEL SZ 6 DBL 3X14 BALL (SUTURE) IMPLANT
SUT VIC AB 1 CTX 18 (SUTURE) ×1 IMPLANT
SUT VIC AB 1 CTX 36 (SUTURE) ×6
SUT VIC AB 1 CTX36XBRD ANBCTR (SUTURE) IMPLANT
SUT VIC AB 2-0 CTX 27 (SUTURE) IMPLANT
SUT VIC AB 3-0 SH 8-18 (SUTURE) ×3 IMPLANT
SYS ATRICLIP LAA EXCLUSION 40 (Clip) ×1 IMPLANT
SYS ATRICLIP LAA EXCLUSION 45 (CLIP) IMPLANT
SYSTEM ANNULOPLASTY MC3 (Orthopedic Implant) ×1 IMPLANT
SYSTEM SAHARA CHEST DRAIN ATS (WOUND CARE) ×6 IMPLANT
TOWEL OR 17X24 6PK STRL BLUE (TOWEL DISPOSABLE) ×9 IMPLANT
TOWEL OR 17X26 10 PK STRL BLUE (TOWEL DISPOSABLE) ×9 IMPLANT
TRAY FOLEY IC TEMP SENS 14FR (CATHETERS) ×3 IMPLANT
TUBING INSUFFLATION 10FT LAP (TUBING) ×6 IMPLANT
UNDERPAD 30X30 INCONTINENT (UNDERPADS AND DIAPERS) ×6 IMPLANT
VALVE MITRAL 31MM (Prosthesis & Implant Heart) ×1 IMPLANT
WATER STERILE IRR 1000ML POUR (IV SOLUTION) ×12 IMPLANT

## 2012-02-15 NOTE — Progress Notes (Signed)
Metoprolol not given this am due to pt. taking  her beta blocker this am .

## 2012-02-15 NOTE — Preoperative (Signed)
Beta Blockers   Reason not to administer Beta Blockers:Not Applicable 

## 2012-02-15 NOTE — Anesthesia Preprocedure Evaluation (Addendum)
Anesthesia Evaluation  Patient identified by MRN, date of birth, ID band Patient awake    Reviewed: Allergy & Precautions, H&P , NPO status , Patient's Chart, lab work & pertinent test results  History of Anesthesia Complications Negative for: history of anesthetic complications  Airway Mallampati: II TM Distance: >3 FB Neck ROM: Full    Dental  (+) Teeth Intact and Dental Advisory Given   Pulmonary shortness of breath and with exertion, asthma , sleep apnea ,    + wheezing      Cardiovascular hypertension, +CHF + dysrhythmias Atrial Fibrillation Rhythm:Irregular Rate:Tachycardia     Neuro/Psych negative neurological ROS  negative psych ROS   GI/Hepatic negative GI ROS, Neg liver ROS,   Endo/Other  diabetes, Insulin DependentMorbid obesity  Renal/GU negative Renal ROS     Musculoskeletal   Abdominal   Peds  Hematology negative hematology ROS (+)   Anesthesia Other Findings   Reproductive/Obstetrics                          Anesthesia Physical Anesthesia Plan  ASA: IV  Anesthesia Plan: General   Post-op Pain Management:    Induction: Intravenous  Airway Management Planned: Oral ETT  Additional Equipment: Arterial line, 3D TEE, Ultrasound Guidance Line Placement and PA Cath  Intra-op Plan:   Post-operative Plan: Post-operative intubation/ventilation  Informed Consent:   Plan Discussed with:   Anesthesia Plan Comments:         Anesthesia Quick Evaluation

## 2012-02-15 NOTE — Transfer of Care (Signed)
Immediate Anesthesia Transfer of Care Note  Patient: [name]  Procedure(s) Performed: Procedure(s) (LRB) with comments: TRICUSPID VALVE REPAIR (N/A) MAZE (N/A) INTRAOPERATIVE TRANSESOPHAGEAL ECHOCARDIOGRAM (N/A) MITRAL VALVE (MV) REPLACEMENT (N/A)  Patient Location: SICU  Anesthesia Type:General  Level of Consciousness: sedated and Patient remains intubated per anesthesia plan  Airway & Oxygen Therapy: Patient remains intubated per anesthesia plan and Patient placed on Ventilator (see vital sign flow sheet for setting)  Post-op Assessment: Report given to PACU RN and Post -op Vital signs reviewed and stable  Post vital signs: Reviewed and stable  Complications: No apparent anesthesia complications

## 2012-02-15 NOTE — Interval H&P Note (Signed)
History and Physical Interval Note:  02/15/2012 8:00 AM  [name]  has presented today for surgery, with the diagnosis of MR TR A-fib  The various methods of treatment have been discussed with the patient and family. After consideration of risks, benefits and other options for treatment, the patient has consented to  Procedure(s) (LRB) with comments: MITRAL VALVE REPAIR (MVR) (N/A) - or replacement TRICUSPID VALVE REPAIR (N/A) MAZE (N/A) INTRAOPERATIVE TRANSESOPHAGEAL ECHOCARDIOGRAM (N/A) as a surgical intervention .  The patient's history has been reviewed, patient examined, no change in status, stable for surgery.  I have reviewed the patient's chart and labs.  Questions were answered to the patient's satisfaction.     Jarmaine Ehrler H

## 2012-02-15 NOTE — Op Note (Addendum)
CARDIOTHORACIC SURGERY OPERATIVE NOTE  Date of Procedure:  02/15/2012  Preoperative Diagnosis:   Severe Mitral Regurgitation  Moderate Tricuspid Regurgitation  Chronic Persistent Atrial Fibrillation  Postoperative Diagnosis: Same   Procedure:   Mitral Valve Replacement  Sorin [employer] Optiform mechanical prosthesis (size 31mm, catalog #[id number], serial #[id number])  Tricuspid Valve Repair  Edwards mc3 ring annuloplasty (size 28mm, model #4900, serial #[id number])  Maze Procedure   complete biatrial lesion set using bipolar radiofrequency and cryothermy ablation with clipping of left atrial appendage    Surgeon: Salvatore Decent. 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  The patient states that she first began to go downhill more than 10 years ago when she first began to experience chronic exertional shortness of breath. Prior to that she used to work 3 jobs and remain quite active physically. Over the past 10 years she describes slow gradual progression of worsening exertional shortness of breath and fatigue. In April 2012 she was first diagnosed with persistent atrial fibrillation. She apparently underwent cardioversion at that time but rapidly returned to atrial fibrillation. She states that over the past year or 2 symptoms have continued to get worse, and she was hospitalized a month  ago with acute exacerbation of chronic diastolic heart failure with acute pulmonary edema. Transthoracic and subsequent transesophageal echocardiograms performed during that hospitalization documented the presence of severe mitral regurgitation. She was treated medically and later underwent cardiac catheterization that revealed normal coronary artery anatomy with no significant coronary artery disease but at least moderate pulmonary hypertension. The patient was referred to consider elective surgical intervention.  The patient has been seen in consultation and counseled at length regarding the indications, risks and potential benefits of surgery.  All questions have been answered, and the patient provides full informed consent for the operation as described.      DETAILS OF THE OPERATIVE PROCEDURE  The patient is brought to the operating room on the above mentioned date and central monitoring was established by the anesthesia team including placement of Swan-Ganz catheter and radial arterial line. The patient is placed in the supine position on the operating table.  Intravenous antibiotics are administered. General endotracheal anesthesia is induced uneventfully. A Foley catheter is placed.  Baseline transesophageal echocardiogram was performed.  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 There is type IIIa dysfunction of the mitral valve with severe restriction of both the anterior and posterior leaflets. Both leaflets are severely thickened and fibrotic. The posterior leaflet is essentially immobile. There is left atrial enlargement. There is normal left ventricular systolic function. The aortic valve is tricuspid. There is mild aortic insufficiency. There is moderate tricuspid regurgitation with moderate dilatation of the tricuspid annulus.  The patient's chest, abdomen, both groins, and both lower extremities are prepared and draped in a sterile  manner. A time out procedure is performed.  A median sternotomy incision was performed and the pericardium is  opened. The ascending aorta is normal in appearance. The right common femoral vein is cannulated using the Seldinger technique and a guidewire advanced into the right atrium using TEE guidance.  The patient is heparinized systemically and the femoral vein cannulated using a 22 Fr long femoral venous cannula.  The ascending aorta is cannulated for cardiopulmonary bypass.  Adequate heparinization is verified.   A retrograde cardioplegia cannula is placed through the right atrium into the coronary sinus.   The entire pre-bypass portion of the operation was notable for stable hemodynamics.  Cardiopulmonary bypass was begun and the surface of the heart is inspected.  A second venous cannula is placed directly into the superior vena cava.   A cardioplegia cannula is placed in the ascending aorta.  A temperature probe was placed in the interventricular septum.  The patient is cooled to 28C systemic temperature.  The aortic cross clamp is applied and cold blood cardioplegia is delivered initially in an antegrade fashion through the aortic root.   Supplemental cardioplegia is given retrograde through the coronary sinus catheter.  Iced saline slush is applied for topical hypothermia.  The initial cardioplegic arrest is rapid with early diastolic arrest.  Repeat doses of cardioplegia are administered intermittently throughout the entire cross clamp portion of the operation through the aortic root and through the coronary sinus catheter in order to maintain completely flat electrocardiogram and septal myocardial temperature below 15C.  Myocardial protection was felt to be excellent.  The Atricure bipolar radiofrequency ablation clamp is used for all radiofrequency ablation lesions for the maze procedure.  The heart is retracted towards the surgeon's side and the left sided pulmonary veins exposed.  An  elliptical ablation lesion is created around the base of the left sided pulmonary veins.  A similar elliptical lesion was created around the base of the left atrial appendage.  The left atrial appendage was obliterated by deploying a 40 mm Atricure left atrial appendage clip across the appendage.  The heart was replaced into the pericardial sac.  A left atriotomy incision was performed through the interatrial groove and extended partially across the back wall of the left atrium after opening the oblique sinus inferiorly.  The floor of the left atrium and the mitral valve were exposed using a self-retaining retractor.  The mitral valve was inspected and notable for classical rheumatic mitral valve disease with severe fibrosis and scarring of both the anterior and posterior leaflets.  The posterior leaflet was essentially frozen and completely immobile. There was severe calcification near the anterior commissure. There is severe thickening and foreshortening of all the subvalvular apparatus. Mitral valve repair is felt not likely to be durable.  An ablation lesion was placed around the right sided pulmonary veins using the bipolar clamp with one limb of the clamp along the endocardial surface and one along the epicardial surface posteriorly.  A bipolar ablation lesion was placed across the dome of the left atrium from the cephalad apex of the atriotomy incision to reach the cephalad apex of the elliptical lesion around the left sided pulmonary veins.  A similar bipolar lesion was placed across the back wall of the left atrium from the caudad apex of the atriotomy incision to reach the caudad apex of the elliptical lesion around the left sided pulmonary veins, thereby completing a box.  Finally another bipolar lesion was placed across the back wall of the left atrium from the caudad apex of the atriotomy incision towards the posterior mitral valve annulus.  This lesion  was completed along the endocardial surface  onto the posterior mitral annulus with a 3 minute duration cryothermy lesion, followed by a second cryothermy lesion along the posterior epicardial surface of the left atrium to the coronary sinus.  This completes the entire left side lesion set of the Cox maze procedure.  The anterior leaflet of the mitral valve was excised sharply, leaving a small rim of the free margin and the associated primary chords.  The posterior leaflet split in the midline.  The mitral annulus was sized to accept a 31 mm prosthesis.  The left ventricle was irrigated with copious cold saline solution.  Mitral valve replacement was performed using interrupted horizontal mattress 2-0 Ethibond pledgeted sutures with pledgets in the supraannular position.  The remaining portions of the anterior leaflet were incorporated into the suture line laterally, thereby preserving chords to both the anterior and posterior leaflet.  A Sorin [employer] Optiform mechanical heart valve (size 31 mm, catalog #[id number], serial # [id number]) was implanted uneventfully. The valve was tested to make sure that the leaflets open close easily without any sign of impingement on the subvalvular apparatus.  The atriotomy was closed using a 2-layer closure of running 3-0 Prolene suture after placing a sump drain across the mitral valve to serve as a left ventricular vent.    The inferior vena cava cannula was pulled down until the tip was just below the junction between the right atrium and the inferior vena cava. An oblique incision is made in the right atrium. Traction sutures are placed to facilitate exposure of the tricuspid valve. The tricuspid valve is inspected carefully. The tricuspid valve leaflets appear normal with normal mobility and no sign of any fibrosis or thickening.   The right sided components of the Cox maze IV procedure is performed.  A bipolar clamp is utilized to create a linear lesion from the posterior apex of the atriotomy incision to  reach the posterior portion of the superior vena cava. A second lesion is placed in the opposite direction from the posterior apex of the atriotomy incision to reach the posterior portion of the inferior vena cava.  The tip of the right atrial appendage is amputated. Another linear lesion is placed from the tip of the right atrial appendage directing towards the lateral wall of the right atrium approximately half the way to the oblique atriotomy incision. The cryotherapy probe was then utilized to place across from a lesion from the anterior apex of the atriotomy incision along the endocardial surface of the right atrium to reach the tricuspid annulus at the 2 o'clock position. One final cryotherapy lesion is then placed through the open tip of the right atrial appendage along the endocardial surface towards the acute margin of the heart to reach the tricuspid annulus at the 10 o'clock position. This completes the right side lesion set.  One final dose of warm retrograde "hot shot" cardioplegia was administered retrograde through the coronary sinus catheter while all air was evacuated through the aortic root.  The aortic cross clamp was removed after a total cross clamp time of 130 minutes.  Tricuspid ring annuloplasty is performed using interrupted 2-0 Ethibond horizontal mattress sutures placed circumferentially around the tricuspid annulus with exception of the area immediately below the triangle of Koch. The valve was sized to accept a 28mm annuloplasty ring based upon the overall surface area of the combined anterior and posterior leaflets. An [facility] 3 annuloplasty ring (size 28mm, model #4900, serial [id number]) is implanted uneventfully. After  completion of the annuloplasty the valve was tested with saline and appears to be competent. The right atriotomy incision is closed using a 2 layer closure of running 4-0 Prolene suture.  Epicardial pacing wires are fixed to the right ventricular outflow tract  and to the right atrial appendage. The patient is rewarmed to 37C temperature. The aortic and left ventricular vents are removed.  The patient is weaned and disconnected from cardiopulmonary bypass.  The patient's rhythm at separation from bypass was AV paced.  The patient was weaned from cardioplegic bypass on low dose milrinone and dopamine infusions.  Total cardiopulmonary bypass time for the operation was 204 minutes.  Followup transesophageal echocardiogram performed after separation from bypass revealed a well-seated mechanical mitral valve prosthesis that was functioning normally and without any sign of perivalvular leak.  There was a well-seated tricuspid annuloplasty ring with mild residual tricuspid regurgitation.  Left ventricular function was unchanged from preoperatively.  The aortic and superior vena cava cannula were removed uneventfully. Protamine was administered to reverse the anticoagulation. The femoral venous cannula was removed and manual pressure held on the groin for 30 minutes.  The mediastinum and pleural space were inspected for hemostasis and irrigated with saline solution. The patient was transfused 2 packs of adult platelets and 2 units fresh frozen plasma for thrombocytopenia and coagulopathy following reversal of heparin with protamine.  The mediastinum was drained using 2 chest tubes placed through separate stab incisions inferiorly.  The soft tissues anterior to the aorta were reapproximated loosely. The sternum is closed with double strength sternal wire. The soft tissues anterior to the sternum were closed in multiple layers and the skin is closed with a running subcuticular skin closure.  The patient tolerated the procedure well and is transported to the surgical intensive care in stable condition. There are no intraoperative complications. All sponge instrument and needle counts are verified correct at completion of the operation.  The post-bypass portion of the  operation was notable for stable rhythm and hemodynamics.    Salvatore Decent. Cornelius Moras MD

## 2012-02-15 NOTE — Anesthesia Postprocedure Evaluation (Signed)
  Anesthesia Post-op Note  Patient: [name]  Procedure(s) Performed: Procedure(s) (LRB) with comments: TRICUSPID VALVE REPAIR (N/A) MAZE (N/A) INTRAOPERATIVE TRANSESOPHAGEAL ECHOCARDIOGRAM (N/A) MITRAL VALVE (MV) REPLACEMENT (N/A)  Patient Location: SICU  Anesthesia Type:General  Level of Consciousness: sedated and Patient remains intubated per anesthesia plan  Airway and Oxygen Therapy: Patient remains intubated per anesthesia plan and Patient placed on Ventilator (see vital sign flow sheet for setting)  Post-op Pain: pt sedated, remains intubated  Post-op Assessment: Post-op Vital signs reviewed, Patient's Cardiovascular Status Stable, Respiratory Function Stable and Patent Airway  Post-op Vital Signs: Reviewed and stable  Complications: No apparent anesthesia complications

## 2012-02-15 NOTE — Brief Op Note (Signed)
02/15/2012  3:39 PM  PATIENT:  [name]  57 y.o. female  PRE-OPERATIVE DIAGNOSIS:   Mitral Regurgitation  Tricuspid Regurgitation  Chronic Persistent Atrial Fibrillation  POST-OPERATIVE DIAGNOSIS:    Mitral Regurgitation  Tricuspid Regurgitation  Chronic Persistent Atrial Fibrillation  PROCEDURE:  Procedure(s) (LRB) with comments: TRICUSPID VALVE REPAIR (N/A) MAZE (N/A) INTRAOPERATIVE TRANSESOPHAGEAL ECHOCARDIOGRAM (N/A) MITRAL VALVE (MV) REPLACEMENT (N/A)  SURGEON:    Purcell Nails, MD  ASSISTANTS:  Coral Ceo, PA-C  ANESTHESIA:   Remonia Richter, MD  CROSSCLAMP TIME:   130'  CARDIOPULMONARY BYPASS TIME: 204'  FINDINGS:  Rheumatic heart disease  Type IIIA mitral valve dysfunction with severe mitral regurgitation  Moderate tricuspid regurgitation  Normal LV systolic function  COMPLICATIONS: none  PATIENT DISPOSITION:   TO SICU IN STABLE CONDITION  OWEN,CLARENCE H 02/15/2012 3:39 PM

## 2012-02-15 NOTE — Anesthesia Procedure Notes (Signed)
Procedure Name: Intubation Date/Time: 02/15/2012 8:46 AM Performed by: Gayla Medicus Pre-anesthesia Checklist: Timeout performed, Patient identified, Emergency Drugs available, Suction available and Patient being monitored Patient Re-evaluated:Patient Re-evaluated prior to inductionOxygen Delivery Method: Circle system utilized Preoxygenation: Pre-oxygenation with 100% oxygen Intubation Type: IV induction Ventilation: Mask ventilation without difficulty Laryngoscope Size: Mac and 3 Grade View: Grade I Tube type: Subglottic suction tube Tube size: 8.0 mm Number of attempts: 1 Airway Equipment and Method: Stylet Placement Confirmation: ETT inserted through vocal cords under direct vision,  positive ETCO2 and breath sounds checked- equal and bilateral Secured at: 21 cm Tube secured with: Tape Dental Injury: Teeth and Oropharynx as per pre-operative assessment

## 2012-02-15 NOTE — Progress Notes (Signed)
  Echocardiogram Echocardiogram Transesophageal (OR) has been performed.  [name] 02/15/2012, 10:45 AM

## 2012-02-15 NOTE — Progress Notes (Signed)
TCTS BRIEF SICU PROGRESS NOTE  Day of Surgery  S/P Procedure(s) (LRB): TRICUSPID VALVE REPAIR (N/A) MAZE (N/A) INTRAOPERATIVE TRANSESOPHAGEAL ECHOCARDIOGRAM (N/A) MITRAL VALVE (MV) REPLACEMENT (N/A)   Sedated on vent AV paced w/ stable hemodynamics Chest tube output minimal UOP excellent Labs okay  Plan: Continue routine early postop  [name] H 02/15/2012 6:09 PM

## 2012-02-16 ENCOUNTER — Inpatient Hospital Stay (HOSPITAL_COMMUNITY): Payer: BC Managed Care – PPO

## 2012-02-16 LAB — GLUCOSE, CAPILLARY
Glucose-Capillary: 106 mg/dL — ABNORMAL HIGH (ref 70–99)
Glucose-Capillary: 109 mg/dL — ABNORMAL HIGH (ref 70–99)
Glucose-Capillary: 110 mg/dL — ABNORMAL HIGH (ref 70–99)
Glucose-Capillary: 125 mg/dL — ABNORMAL HIGH (ref 70–99)
Glucose-Capillary: 128 mg/dL — ABNORMAL HIGH (ref 70–99)
Glucose-Capillary: 129 mg/dL — ABNORMAL HIGH (ref 70–99)
Glucose-Capillary: 131 mg/dL — ABNORMAL HIGH (ref 70–99)
Glucose-Capillary: 135 mg/dL — ABNORMAL HIGH (ref 70–99)
Glucose-Capillary: 152 mg/dL — ABNORMAL HIGH (ref 70–99)
Glucose-Capillary: 81 mg/dL (ref 70–99)
Glucose-Capillary: 91 mg/dL (ref 70–99)
Glucose-Capillary: 92 mg/dL (ref 70–99)
Glucose-Capillary: 93 mg/dL (ref 70–99)

## 2012-02-16 LAB — CBC
HCT: 28.2 % — ABNORMAL LOW (ref 36.0–46.0)
MCH: 30.4 pg (ref 26.0–34.0)
MCHC: 33.1 g/dL (ref 30.0–36.0)
MCHC: 33.7 g/dL (ref 30.0–36.0)
Platelets: 112 10*3/uL — ABNORMAL LOW (ref 150–400)
RDW: 13.2 % (ref 11.5–15.5)

## 2012-02-16 LAB — PREPARE FRESH FROZEN PLASMA: Unit division: 0

## 2012-02-16 LAB — POCT I-STAT 3, ART BLOOD GAS (G3+)
Acid-base deficit: 2 mmol/L (ref 0.0–2.0)
Bicarbonate: 23 mEq/L (ref 20.0–24.0)
Bicarbonate: 23 mEq/L (ref 20.0–24.0)
O2 Saturation: 90 %
Patient temperature: 36.8
TCO2: 24 mmol/L (ref 0–100)
pCO2 arterial: 42.2 mmHg (ref 35.0–45.0)
pCO2 arterial: 46.1 mmHg — ABNORMAL HIGH (ref 35.0–45.0)
pCO2 arterial: 43.6 mmHg (ref 35.0–45.0)
pH, Arterial: 7.321 — ABNORMAL LOW (ref 7.350–7.450)
pH, Arterial: 7.335 — ABNORMAL LOW (ref 7.350–7.450)
pH, Arterial: 7.35 (ref 7.350–7.450)
pO2, Arterial: 70 mmHg — ABNORMAL LOW (ref 80.0–100.0)
pO2, Arterial: 67 mmHg — ABNORMAL LOW (ref 80.0–100.0)

## 2012-02-16 LAB — MAGNESIUM: Magnesium: 2.1 mg/dL (ref 1.5–2.5)

## 2012-02-16 LAB — PREPARE PLATELET PHERESIS: Unit division: 0

## 2012-02-16 LAB — POCT I-STAT, CHEM 8
BUN: 13 mg/dL (ref 6–23)
Calcium, Ion: 1.17 mmol/L (ref 1.12–1.23)
Chloride: 106 mEq/L (ref 96–112)
HCT: 30 % — ABNORMAL LOW (ref 36.0–46.0)
Hemoglobin: 10.2 g/dL — ABNORMAL LOW (ref 12.0–15.0)
Potassium: 4.3 mEq/L (ref 3.5–5.1)
Sodium: 137 mEq/L (ref 135–145)
TCO2: 25 mmol/L (ref 0–100)
TCO2: 24 mmol/L (ref 0–100)

## 2012-02-16 LAB — BASIC METABOLIC PANEL
Calcium: 8.2 mg/dL — ABNORMAL LOW (ref 8.4–10.5)
GFR calc non Af Amer: >90 mL/min (ref >90)
Glucose, Bld: 150 mg/dL — ABNORMAL HIGH (ref 70–99)
Sodium: 139 mEq/L (ref 135–145)

## 2012-02-16 LAB — HEMOGLOBIN AND HEMATOCRIT, BLOOD: Hemoglobin: 8.3 g/dL — ABNORMAL LOW (ref 12.0–15.0)

## 2012-02-16 LAB — CREATININE, SERUM: GFR calc non Af Amer: 79 mL/min — ABNORMAL LOW (ref >90)

## 2012-02-16 MED ORDER — AMIODARONE HCL 200 MG PO TABS
200.0000 mg | ORAL_TABLET | Freq: Two times a day (BID) | ORAL | Status: DC
  Administered 2012-02-16 – 2012-02-17 (×3): 200 mg via ORAL
  Filled 2012-02-16 (×4): qty 1

## 2012-02-16 MED ORDER — INSULIN ASPART 100 UNIT/ML ~~LOC~~ SOLN
0.0000 [IU] | SUBCUTANEOUS | Status: DC
  Administered 2012-02-16 (×2): 2 [IU] via SUBCUTANEOUS

## 2012-02-16 MED ORDER — INSULIN DETEMIR 100 UNIT/ML ~~LOC~~ SOLN
30.0000 [IU] | Freq: Two times a day (BID) | SUBCUTANEOUS | Status: DC
  Administered 2012-02-16 (×2): 30 [IU] via SUBCUTANEOUS

## 2012-02-16 MED ORDER — MORPHINE SULFATE 2 MG/ML IJ SOLN
2.0000 mg | INTRAMUSCULAR | Status: DC | PRN
  Administered 2012-02-16 – 2012-02-17 (×3): 2 mg via INTRAVENOUS
  Filled 2012-02-16 (×4): qty 1

## 2012-02-16 MED ORDER — AZITHROMYCIN 500 MG PO TABS
500.0000 mg | ORAL_TABLET | Freq: Every day | ORAL | Status: AC
  Administered 2012-02-16: 500 mg via ORAL
  Filled 2012-02-16: qty 1

## 2012-02-16 MED ORDER — AZITHROMYCIN 250 MG PO TABS
250.0000 mg | ORAL_TABLET | Freq: Every day | ORAL | Status: AC
  Administered 2012-02-17 – 2012-02-20 (×4): 250 mg via ORAL
  Filled 2012-02-16 (×4): qty 1

## 2012-02-16 MED ORDER — FUROSEMIDE 10 MG/ML IJ SOLN
20.0000 mg | Freq: Four times a day (QID) | INTRAMUSCULAR | Status: AC
  Administered 2012-02-16 (×3): 20 mg via INTRAVENOUS
  Filled 2012-02-16: qty 2

## 2012-02-16 MED ORDER — WARFARIN SODIUM 2.5 MG PO TABS
2.5000 mg | ORAL_TABLET | Freq: Every day | ORAL | Status: DC
  Administered 2012-02-16: 2.5 mg via ORAL
  Filled 2012-02-16 (×2): qty 1

## 2012-02-16 MED ORDER — WARFARIN - PHYSICIAN DOSING INPATIENT
Freq: Every day | Status: DC
  Administered 2012-02-18: 18:00:00

## 2012-02-16 MED FILL — Sodium Bicarbonate IV Soln 8.4%: INTRAVENOUS | Qty: 50 | Status: AC

## 2012-02-16 MED FILL — Sodium Chloride Irrigation Soln 0.9%: Qty: 3000 | Status: AC

## 2012-02-16 MED FILL — Magnesium Sulfate Inj 50%: INTRAMUSCULAR | Qty: 10 | Status: AC

## 2012-02-16 MED FILL — Heparin Sodium (Porcine) Inj 1000 Unit/ML: INTRAMUSCULAR | Qty: 30 | Status: AC

## 2012-02-16 MED FILL — Mannitol IV Soln 20%: INTRAVENOUS | Qty: 500 | Status: AC

## 2012-02-16 MED FILL — Sodium Chloride IV Soln 0.9%: INTRAVENOUS | Qty: 1000 | Status: AC

## 2012-02-16 MED FILL — Electrolyte-R (PH 7.4) Solution: INTRAVENOUS | Qty: 5000 | Status: AC

## 2012-02-16 MED FILL — Potassium Chloride Inj 2 mEq/ML: INTRAVENOUS | Qty: 40 | Status: AC

## 2012-02-16 MED FILL — Heparin Sodium (Porcine) Inj 1000 Unit/ML: INTRAMUSCULAR | Qty: 10 | Status: AC

## 2012-02-16 MED FILL — Lidocaine HCl IV Inj 20 MG/ML: INTRAVENOUS | Qty: 5 | Status: AC

## 2012-02-16 NOTE — Plan of Care (Signed)
Problem: Phase II Progression Outcomes Goal: Patient extubated within - Outcome: Completed/Met Date Met:  02/16/12 Extubated within 12 hours

## 2012-02-16 NOTE — Progress Notes (Signed)
T. CTS  Out of bed today in chair with wet cough Fever 102 this morning now afebrile Antibiotics continue because of fever and suspected tracheal bronchitis Maintaining sinus rhythm on low-dose dopamine , milrinone wean off Evening labs adequate with potassium 4.5 hematocrit 28 glucose 137

## 2012-02-16 NOTE — Progress Notes (Signed)
   CARDIOTHORACIC SURGERY PROGRESS NOTE   R1 Day Post-Op Procedure(s) (LRB): TRICUSPID VALVE REPAIR (N/A) MAZE (N/A) INTRAOPERATIVE TRANSESOPHAGEAL ECHOCARDIOGRAM (N/A) MITRAL VALVE (MV) REPLACEMENT (N/A)  Subjective: Looks good.  Minimal pain in chest.  Breathing comfortably and denies SOB.  + non-productive cough and low grade fever overnight, now reporting cough and low grade fever last week PTA!!  No nausea.  Objective: Vital signs: BP Readings from Last 1 Encounters:  02/16/12 115/48   Pulse Readings from Last 1 Encounters:  02/16/12 91   Resp Readings from Last 1 Encounters:  02/16/12 24   Temp Readings from Last 1 Encounters:  02/16/12 101.5 F (38.6 C) Core (Comment)    Hemodynamics: PAP: (41-63)/(21-38) 55/26 mmHg CO:  [3.2 L/min-5 L/min] 5 L/min CI:  [1.5 L/min/m2-2.4 L/min/m2] 2.4 L/min/m2  Physical Exam:  Rhythm:   Sinus 70's  Breath sounds: Few rhonchi  Heart sounds:  RRR  Incisions:  Dressings dry, intact  Abdomen:  soft  Extremities:  Warm, well perfused   Intake/Output from previous day: 02/05 0701 - 02/06 0700 In: 8170.5 [I.V.:4835.5; Blood:1865; IV Piggyback:1470] Out: 5290 [Urine:3570; Blood:1400; Chest Tube:320] Intake/Output this shift:    Lab Results:  Basename 02/16/12 0430 02/15/12 2201 02/15/12 2200  WBC 7.6 -- 8.2  HGB 9.4* 10.2* --  HCT 28.4* 30.0* --  PLT 112* -- 113*   BMET:  Basename 02/16/12 0430 02/15/12 2201 02/13/12 1516  NA 139 141 --  K 4.3 4.3 --  CL 107 106 --  CO2 23 -- 23  GLUCOSE 150* 100* --  BUN 10 9 --  CREATININE 0.62 0.70 --  CALCIUM 8.2* -- 9.4    CBG (last 3)   Basename 02/16/12 0430 02/16/12 0336 02/16/12 0233  GLUCAP 152* 106* 109*   ABG    Component Value Date/Time   PHART 7.338* 02/16/2012 0432   HCO3 23.0 02/16/2012 0432   TCO2 24 02/16/2012 0432   ACIDBASEDEF 2.0 02/16/2012 0432   O2SAT 90.0 02/16/2012 0432   CXR: Mild CHF and bibasilar atelectasis  Assessment/Plan: S/P Procedure(s)  (LRB): TRICUSPID VALVE REPAIR (N/A) MAZE (N/A) INTRAOPERATIVE TRANSESOPHAGEAL ECHOCARDIOGRAM (N/A) MITRAL VALVE (MV) REPLACEMENT (N/A)  Doing well POD1 Maintaining NSR w/ stable hemodynamics Expected post op acute blood loss anemia, mild, stable Expected post op volume excess w/ chronic diastolic CHF PTA Type II diabetes mellitus, excellent glycemic control on insulin drip Cough and low grade fever, ? preop tracheobronchitis Morbid obesity   Mobilize  Wean drips  Start diuretics  Resume amiodarone  Add levemir insulin and wean drip  Start coumadin  Empiric azithromycin    [name] H 02/16/2012 7:54 AM

## 2012-02-16 NOTE — Progress Notes (Deleted)
Wean attempt #1.  Patient was awake to speech, follow commands, was able to hold her head off the bed.  She met the weaning criteria (no bleedings, hemodynamic stable, CI > 1.8).  Vent settings from [facility] 50/5/12/500 were changed to 40/4 and then CPAP.    ABG results:  pH 7.32 PCO2 53.7 PO2 61 BE -1 HCO3 27.8 SaO2 89  Per ABG, patient did not meet weaning parameters.  Will return patient to vent support at 50/5/12/550 per RT

## 2012-02-16 NOTE — Procedures (Signed)
Extubation Procedure Note  Patient Details:   Name: [name] DOB: July 14, 1955 MRN: [id number]   Airway Documentation:     Evaluation  O2 sats: stable throughout Complications: No apparent complications Patient did tolerate procedure well. Bilateral Breath Sounds: Rhonchi Suctioning: Oral Yes Placed on 5l [place] for Sats. NIF -40 VC 700 with good efforts. Patient has lung history.  ABG    Component Value Date/Time   PHART 7.350 02/16/2012 0318   PCO2ART 42.2 02/16/2012 0318   PO2ART 71.0* 02/16/2012 0318   HCO3 23.0 02/16/2012 0318   TCO2 24 02/16/2012 0318   ACIDBASEDEF 2.0 02/16/2012 0318   O2SAT 92.0 02/16/2012 0318     Vilinda Blanks, Burna Cash 02/16/2012, 4:25 AM

## 2012-02-16 NOTE — Progress Notes (Signed)
Wean Attempt #3  Patient is fully awake, fully responsive. Met all weaning criteria.  ABG obtained, results WNL, except SaO2 was 92. Per discussing with RT, decided to extubate patient. Patient extubated, tolerated well, administered oxygen at 5L via nasal cannula, sats were > than 93%.  Patient did IS, achieved 500.  Will continue to encourage use of IS.  Results for AUDREA, BOLTE (MRN [id number]) as of 02/16/2012 04:44  Ref. Range 02/16/2012 03:18  Sample type No range found ARTERIAL  pH, Arterial Latest Range: 7.350-7.450  7.350  pCO2 arterial Latest Range: 35.0-45.0 mmHg 42.2  pO2, Arterial Latest Range: 80.0-100.0 mmHg 71.0 (L)  Bicarbonate Latest Range: 20.0-24.0 mEq/L 23.0  TCO2 Latest Range: 0-100 mmol/L 24  Acid-base deficit Latest Range: 0.0-2.0 mmol/L 2.0  O2 Saturation No range found 92.0  Patient temperature No range found 38.3 C

## 2012-02-16 NOTE — Progress Notes (Signed)
Wean Attempt # 2 Patient was awake to speech, follow commands, was able to hold her head off the bed. She met the weaning criteria (no bleedings, hemodynamic stable, CI > 1.8). Vent settings from [facility] 50/5/12/550 were changed to 40/4 and then CPAP.  ABG results below, per ABG, did not take patient off vent, discussed with other RNs and RT, will put patient back on vent support at 40/5/12/600   ABG Results  Ref. Range 02/16/2012 00:56  Sample type No range found ARTERIAL  pH, Arterial Latest Range: 7.350-7.450  7.335 (L)  pCO2 arterial Latest Range: 35.0-45.0 mmHg 46.1 (H)  pO2, Arterial Latest Range: 80.0-100.0 mmHg 70.0 (L)  Bicarbonate Latest Range: 20.0-24.0 mEq/L 24.4 (H)  TCO2 Latest Range: 0-100 mmol/L 26  Acid-base deficit Latest Range: 0.0-2.0 mmol/L 1.0  O2 Saturation No range found 92.0  Patient temperature No range found 37.6 C

## 2012-02-16 NOTE — Progress Notes (Signed)
Wean attempt #1. Patient was awake to speech, follow commands, was able to hold her head off the bed. She met the weaning criteria (no bleedings, hemodynamic stable, CI > 1.8). Vent settings from [facility] 50/5/12/500 were changed to 40/4 and then CPAP.  ABG results:  pH 7.32  PCO2 53.7  PO2 61  BE -1  HCO3 27.8  SaO2 89  Per ABG, patient did not meet weaning parameters. Will return patient to vent support at 50/5/12/550 per RT.  Will continue to keep patient engaged, awake, and will re-attempt once patient is more awake

## 2012-02-17 ENCOUNTER — Inpatient Hospital Stay (HOSPITAL_COMMUNITY): Payer: BC Managed Care – PPO

## 2012-02-17 ENCOUNTER — Encounter (HOSPITAL_COMMUNITY): Payer: Self-pay | Admitting: Thoracic Surgery (Cardiothoracic Vascular Surgery)

## 2012-02-17 LAB — CBC
HCT: 27.8 % — ABNORMAL LOW (ref 36.0–46.0)
Hemoglobin: 9 g/dL — ABNORMAL LOW (ref 12.0–15.0)
MCV: 93.3 fL (ref 78.0–100.0)
RBC: 2.98 MIL/uL — ABNORMAL LOW (ref 3.87–5.11)
RDW: 13.3 % (ref 11.5–15.5)
WBC: 9.7 10*3/uL (ref 4.0–10.5)

## 2012-02-17 LAB — GLUCOSE, CAPILLARY
Glucose-Capillary: 108 mg/dL — ABNORMAL HIGH (ref 70–99)
Glucose-Capillary: 109 mg/dL — ABNORMAL HIGH (ref 70–99)
Glucose-Capillary: 75 mg/dL (ref 70–99)

## 2012-02-17 LAB — PROTIME-INR: INR: 1.32 (ref 0.00–1.49)

## 2012-02-17 LAB — BASIC METABOLIC PANEL
CO2: 26 mEq/L (ref 19–32)
Chloride: 102 mEq/L (ref 96–112)
Creatinine, Ser: 1 mg/dL (ref 0.50–1.10)
GFR calc Af Amer: 71 mL/min — ABNORMAL LOW (ref >90)
Potassium: 4.3 mEq/L (ref 3.5–5.1)
Sodium: 136 mEq/L (ref 135–145)

## 2012-02-17 MED ORDER — ENOXAPARIN SODIUM 40 MG/0.4ML ~~LOC~~ SOLN
40.0000 mg | SUBCUTANEOUS | Status: DC
  Administered 2012-02-17 – 2012-02-19 (×3): 40 mg via SUBCUTANEOUS
  Filled 2012-02-17 (×4): qty 0.4

## 2012-02-17 MED ORDER — AMIODARONE HCL 200 MG PO TABS
400.0000 mg | ORAL_TABLET | Freq: Two times a day (BID) | ORAL | Status: DC
  Administered 2012-02-17 – 2012-02-23 (×13): 400 mg via ORAL
  Filled 2012-02-17 (×15): qty 2

## 2012-02-17 MED ORDER — INSULIN DETEMIR 100 UNIT/ML ~~LOC~~ SOLN: 20.0000 [IU] | Freq: Two times a day (BID) | SUBCUTANEOUS | Status: DC

## 2012-02-17 MED ORDER — SODIUM CHLORIDE 0.9 % IJ SOLN
10.0000 mL | Freq: Two times a day (BID) | INTRAMUSCULAR | Status: DC
  Administered 2012-02-17 – 2012-02-21 (×7): 10 mL

## 2012-02-17 MED ORDER — SODIUM CHLORIDE 0.9 % IJ SOLN: 3.0000 mL | INTRAMUSCULAR | Status: DC | PRN

## 2012-02-17 MED ORDER — VANCOMYCIN HCL IN DEXTROSE 1-5 GM/200ML-% IV SOLN
1000.0000 mg | Freq: Two times a day (BID) | INTRAVENOUS | Status: DC
  Administered 2012-02-17 – 2012-02-19 (×6): 1000 mg via INTRAVENOUS
  Filled 2012-02-17 (×8): qty 200

## 2012-02-17 MED ORDER — WARFARIN SODIUM 5 MG PO TABS
5.0000 mg | ORAL_TABLET | Freq: Every day | ORAL | Status: DC
  Administered 2012-02-17 – 2012-02-20 (×4): 5 mg via ORAL
  Filled 2012-02-17 (×5): qty 1

## 2012-02-17 MED ORDER — SODIUM CHLORIDE 0.9 % IJ SOLN
10.0000 mL | INTRAMUSCULAR | Status: DC | PRN
  Administered 2012-02-18: 10 mL
  Filled 2012-02-17: qty 10

## 2012-02-17 MED ORDER — FUROSEMIDE 10 MG/ML IJ SOLN
10.0000 mg/h | INTRAVENOUS | Status: DC
  Administered 2012-02-17: 10 mg/h via INTRAVENOUS
  Filled 2012-02-17 (×5): qty 25

## 2012-02-17 MED ORDER — SODIUM CHLORIDE 0.9 % IJ SOLN
3.0000 mL | Freq: Two times a day (BID) | INTRAMUSCULAR | Status: DC
  Administered 2012-02-17: 3 mL via INTRAVENOUS

## 2012-02-17 MED ORDER — INSULIN ASPART 100 UNIT/ML ~~LOC~~ SOLN
0.0000 [IU] | Freq: Three times a day (TID) | SUBCUTANEOUS | Status: DC
  Administered 2012-02-17 (×2): 2 [IU] via SUBCUTANEOUS
  Administered 2012-02-18: 4 [IU] via SUBCUTANEOUS
  Administered 2012-02-18 – 2012-02-19 (×2): 2 [IU] via SUBCUTANEOUS
  Administered 2012-02-19: 4 [IU] via SUBCUTANEOUS
  Administered 2012-02-21: 2 [IU] via SUBCUTANEOUS

## 2012-02-17 MED ORDER — ASPIRIN EC 81 MG PO TBEC
81.0000 mg | DELAYED_RELEASE_TABLET | Freq: Every day | ORAL | Status: DC
  Administered 2012-02-17 – 2012-02-23 (×7): 81 mg via ORAL
  Filled 2012-02-17 (×8): qty 1

## 2012-02-17 MED ORDER — SODIUM CHLORIDE 0.9 % IV SOLN
250.0000 mL | INTRAVENOUS | Status: DC | PRN
  Administered 2012-02-20: 250 mL via INTRAVENOUS

## 2012-02-17 MED ORDER — MOVING RIGHT ALONG BOOK
Freq: Once | Status: AC
  Administered 2012-02-17: 11:00:00
  Filled 2012-02-17: qty 1

## 2012-02-17 MED ORDER — TRAMADOL HCL 50 MG PO TABS: 50.0000 mg | ORAL_TABLET | ORAL | Status: DC | PRN

## 2012-02-17 MED ORDER — INSULIN DETEMIR 100 UNIT/ML ~~LOC~~ SOLN
20.0000 [IU] | Freq: Two times a day (BID) | SUBCUTANEOUS | Status: DC
  Administered 2012-02-17 – 2012-02-24 (×16): 20 [IU] via SUBCUTANEOUS

## 2012-02-17 MED ORDER — DEXTROSE 5 % IV SOLN
1.0000 g | INTRAVENOUS | Status: AC
  Administered 2012-02-17 – 2012-02-23 (×7): 1 g via INTRAVENOUS
  Filled 2012-02-17 (×7): qty 10

## 2012-02-17 NOTE — Progress Notes (Addendum)
CARDIOTHORACIC SURGERY PROGRESS NOTE   R2 Days Post-Op Procedure(s) (LRB): TRICUSPID VALVE REPAIR (N/A) MAZE (N/A) INTRAOPERATIVE TRANSESOPHAGEAL ECHOCARDIOGRAM (N/A) MITRAL VALVE (MV) REPLACEMENT (N/A)  Subjective: Reports feeling well.  Minimal pain.  Denies SOB.  Still with wet sounding cough but not coughing anything up.  Objective: Vital signs: BP Readings from Last 1 Encounters:  02/17/12 117/63   Pulse Readings from Last 1 Encounters:  02/17/12 70   Resp Readings from Last 1 Encounters:  02/17/12 19   Temp Readings from Last 1 Encounters:  02/17/12 97.8 F (36.6 C) Oral    Hemodynamics:    Physical Exam:  Rhythm:   Sinus 70's  Breath sounds: Coarse bilateral rhonchi  Heart sounds:  RRR  Incisions:  Dressings dry  Abdomen:  soft  Extremities:  Warm, well perfused   Intake/Output from previous day: 02/06 0701 - 02/07 0700 In: 933 [P.O.:200; I.V.:627; IV Piggyback:106] Out: 4098 [JXBJY:7829; Chest Tube:170] Intake/Output this shift: Total I/O In: 140 [P.O.:120; I.V.:20] Out: 30 [Urine:30]  Lab Results:  Basename 02/17/12 0400 02/16/12 1704 02/16/12 1700  WBC 9.7 -- 11.2*  HGB 9.0* 9.5* --  HCT 27.8* 28.0* --  PLT 106* -- 113*   BMET:  Basename 02/17/12 0400 02/16/12 1704 02/16/12 0430  NA 136 137 --  K 4.3 4.3 --  CL 102 106 --  CO2 26 -- 23  GLUCOSE 82 137* --  BUN 19 13 --  CREATININE 1.00 1.00 --  CALCIUM 8.6 -- 8.2*    CBG (last 3)   Basename 02/17/12 0747 02/17/12 0421 02/16/12 2347  GLUCAP 68* 75 108*   ABG    Component Value Date/Time   PHART 7.338* 02/16/2012 0432   HCO3 23.0 02/16/2012 0432   TCO2 24 02/16/2012 1704   ACIDBASEDEF 2.0 02/16/2012 0432   O2SAT 90.0 02/16/2012 0432   CXR: *RADIOLOGY REPORT*  Clinical Data: Postop CABG (tricuspid valve repair)  PORTABLE CHEST - 1 VIEW  Comparison: 02/16/2012; 02/13/2012; 02/19/2011  Findings:  Grossly unchanged enlarged cardiac silhouette and mediastinal  contours with  prominence of the central pulmonary vasculature.  Interval removal of PA catheter with vascular sheath tip projected  over the superior/mid SVC. Minimally improved aeration of the left  mid lung. Pulmonary vasculature otherwise remains indistinct.  Likely unchanged trace left-sided effusion. No definite  pneumothorax. Unchanged bones.  IMPRESSION:  1. Interval removal of PA catheter with gastric sheath tip  projecting at the superior/mid SVC.  2. Minimal improved aeration of the left mid lung with persistent  findings of pulmonary edema and trace right-sided effusion.  Original Report Authenticated By: Tacey Ruiz, MD   Assessment/Plan: S/P Procedure(s) (LRB): TRICUSPID VALVE REPAIR (N/A) MAZE (N/A) INTRAOPERATIVE TRANSESOPHAGEAL ECHOCARDIOGRAM (N/A) MITRAL VALVE (MV) REPLACEMENT (N/A)  Overall stable POD2 Maintaining NSR w/ stable BP off all drips Expected post op acute blood loss anemia, mild, stable Expected post op volume excess with long h/o chronic diastolic CHF, not diuresing much Probable tracheobronchitis Type II diabetes mellitus w/ excellent glycemic control Morbid obesity   Mobilize  Will try lasix drip  Leave foley in place with lasix drip  Continue amiodarone and metoprolol  Restart ARB (Diovan) in 2-3 days if renal function stable and BP will allow  Start Vanc + Rocephin for presumed preop tracheobronchitis and check sputum culture  Continue coumadin for mechanical mitral valve, 5 mg daily for now  Low dose lovenox until INR > 1.8  Continue levemir and change CBG's to ac/hs, restart oral agents in a  few days once stable  Keep in ICU today  Place PICC for IV access    [name] H 02/17/2012 10:09 AM

## 2012-02-17 NOTE — Care Management Note (Signed)
    Page 1 of 2   02/24/2012     4:37:18 PM   CARE MANAGEMENT NOTE 02/24/2012  Patient:  TAMARRA, GEISELMAN   Account Number:  [id number]  Date Initiated:  02/16/2012  Documentation initiated by:  [facility]  Subjective/Objective Assessment:   Post op TVR, Maze and AVR.     Action/Plan:   PTA, PT INDEPENDENT, LIVES WITH SPOUSE.   Anticipated DC Date:  02/25/2012   Anticipated DC Plan:  HOME W HOME HEALTH SERVICES      DC Planning Services  CM consult      Choice offered to / List presented to:     DME arranged  Levan Hurst      DME agency  [facility]        Status of service:  Completed, signed off Medicare Important Message given?   (If response is "NO", the following Medicare IM given date fields will be blank) Date Medicare IM given:   Date Additional Medicare IM given:    Discharge Disposition:  HOME/SELF CARE  Per UR Regulation:  Reviewed for med. necessity/level of care/duration of stay  If discussed at Long Length of Stay Meetings, dates discussed:    Comments:  ContactBarbra, Miner Daughter [phone]                [place], [place] [phone]                Seckinger,Wilbert Spouse [phone]  02/24/12 Asuna Peth,RN,BSN [phone] PT REQUESTS RW FOR HOME.  REFERRAL TO [facility] FOR DME NEEDS.  02-20-12 10:30am Avie Arenas, RNBSN [phone] Fluid overload - has been on lasix drip - plan for dcing drip today and placing on po.  02-17-12 2pm Avie Arenas, RNBSN [phone] Sleepy - plan for discharge is to go home with husband and children who will be with her 24/7. CM will continue to follow for further dc needs.  02-16-12 Milagros Loll, RNBSN [phone] Post op yesterday - extubated this am.  CM will continue to follow for progression.

## 2012-02-17 NOTE — Progress Notes (Signed)
RT assessment completed 02/16/12.  No further respiratory interventions needed at this time.

## 2012-02-17 NOTE — Progress Notes (Signed)
POD # 2  BP 106/45  Pulse 69  Temp 99.9 F (37.7 C) (Oral)  Resp 25  Ht 5\' 6"  (1.676 m)  Wt 244 lb 8 oz (110.904 kg)  BMI 39.46 kg/m2  SpO2 99%   Intake/Output Summary (Last 24 hours) at 02/17/12 1741 Last data filed at 02/17/12 1700  Gross per 24 hour  Intake 1781.9 ml  Output    940 ml  Net  841.9 ml    Pulmonary status still marginal  On lasix gtt

## 2012-02-17 NOTE — Progress Notes (Signed)
ANTIBIOTIC CONSULT NOTE - INITIAL  Pharmacy Consult for Vancomycin Indication: suspected pre-op tracheobronchitis?  No Known Allergies  Patient Measurements: Height: 5\' 6"  (167.6 cm) Weight: 244 lb 8 oz (110.904 kg) IBW/kg (Calculated) : 59.3  Vital Signs: Temp: 97.8 F (36.6 C) (02/07 0749) Temp src: Oral (02/07 0749) BP: 117/63 mmHg (02/07 0900) Pulse Rate: 70  (02/07 0900) Intake/Output from previous day: 02/06 0701 - 02/07 0700 In: 933 [P.O.:200; I.V.:627; IV Piggyback:106] Out: 1610 [RUEAV:4098; Chest Tube:170] Intake/Output from this shift: Total I/O In: 420 [P.O.:360; I.V.:60] Out: 90 [Urine:90]  Labs:  Basename 02/17/12 0400 02/16/12 1704 02/16/12 1700 02/16/12 0430  WBC 9.7 -- 11.2* 7.6  HGB 9.0* 9.5* 9.5* --  PLT 106* -- 113* 112*  LABCREA -- -- -- --  CREATININE 1.00 1.00 0.81 --   Estimated Creatinine Clearance: 78.3 ml/min (by C-G formula based on Cr of 1).  Microbiology: Recent Results (from the past 720 hour(s))  SURGICAL PCR SCREEN     Status: Normal   Collection Time   02/13/12  3:01 PM      Component Value Range Status Comment   MRSA, PCR NEGATIVE  NEGATIVE Final    Staphylococcus aureus NEGATIVE  NEGATIVE Final     Medical History: Past Medical History  Diagnosis Date  . Asthma   . Hypertension   . Obesity   . Chronic diastolic congestive heart failure   . Carotid bruit   . Hypercholesterolemia   . Polyp of rectum   . Abnormal chest CT   . Persistent atrial fibrillation   . Bronchospasm 1998  . Atrial enlargement, left     severe  . Heart murmur   . Sleep apnea     DOES NOT HAVE CPAP  . Diabetes mellitus     insulin dependent  . Mitral regurgitation   . Tricuspid regurgitation 01/16/2012  . Rheumatic fever 01/16/2012    Reported during childhood  . Atrial fibrillation 01/05/2011    Chronic persistent, failed DCCV   . Obesity (BMI 30-39.9) 01/16/2012  . Shortness of breath     with exertion  . History of blood transfusion   . 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 S/P Maze operation for atrial fibrillation 02/15/2012    Complete biatrial lesion set using bipolar radiofrequency and cryothermy ablation with clipping of LA appendage   Assessment: 56 YOF s/p MVR and tricuspid valve repair on 2/5 now on Vancomycin per RX dosing, Rocephin, and Azithromycin for presumed preop tracheobronchitis. WBC is wnl. Patient is afebrile. Sputum culture is pending.   Antibiotic Hx: Vanc 2/5>>2/6; 2/7>> Ceftaz 2/5>>2/6 CTX 2/7 >> Azith 2/6 >> 2/10  Goal of Therapy:  Vancomycin trough level 10-15 mcg/ml  Plan:  1. Vancomycin 1g IV q12h.  2. Follow-up ability to narrow abx.  3. Vancomycin trough at Css if continue.  4. Follow-up culture, renal function, and clinical status.   Fayne Norrie 02/17/2012,10:40 AM

## 2012-02-17 NOTE — Progress Notes (Signed)
Peripherally Inserted Central Catheter/Midline Placement  The IV Nurse has discussed with the patient and/or persons authorized to consent for the patient, the purpose of this procedure and the potential benefits and risks involved with this procedure.  The benefits include less needle sticks, lab draws from the catheter and patient may be discharged home with the catheter.  Risks include, but not limited to, infection, bleeding, blood clot (thrombus formation), and puncture of an artery; nerve damage and irregular heat beat.  Alternatives to this procedure were also discussed.  PICC/Midline Placement Documentation        [name] 02/17/2012, 11:15 AM

## 2012-02-18 ENCOUNTER — Inpatient Hospital Stay (HOSPITAL_COMMUNITY): Payer: BC Managed Care – PPO

## 2012-02-18 LAB — CBC
HCT: 25.6 % — ABNORMAL LOW (ref 36.0–46.0)
Hemoglobin: 8.4 g/dL — ABNORMAL LOW (ref 12.0–15.0)
MCH: 31.1 pg (ref 26.0–34.0)
MCHC: 32.8 g/dL (ref 30.0–36.0)
MCV: 94.8 fL (ref 78.0–100.0)
RBC: 2.7 MIL/uL — ABNORMAL LOW (ref 3.87–5.11)

## 2012-02-18 LAB — BASIC METABOLIC PANEL
BUN: 24 mg/dL — ABNORMAL HIGH (ref 6–23)
CO2: 29 mEq/L (ref 19–32)
Calcium: 8.4 mg/dL (ref 8.4–10.5)
Creatinine, Ser: 0.96 mg/dL (ref 0.50–1.10)
GFR calc non Af Amer: 64 mL/min — ABNORMAL LOW (ref >90)
Glucose, Bld: 87 mg/dL (ref 70–99)

## 2012-02-18 LAB — GLUCOSE, CAPILLARY
Glucose-Capillary: 79 mg/dL (ref 70–99)
Glucose-Capillary: 168 mg/dL — ABNORMAL HIGH (ref 70–99)
Glucose-Capillary: 91 mg/dL (ref 70–99)

## 2012-02-18 LAB — PROTIME-INR: Prothrombin Time: 15.7 seconds — ABNORMAL HIGH (ref 11.6–15.2)

## 2012-02-18 LAB — EXPECTORATED SPUTUM ASSESSMENT W GRAM STAIN, RFLX TO RESP C

## 2012-02-18 MED ORDER — POTASSIUM CHLORIDE 10 MEQ/50ML IV SOLN
10.0000 meq | INTRAVENOUS | Status: AC
  Administered 2012-02-18 (×3): 10 meq via INTRAVENOUS
  Filled 2012-02-18 (×2): qty 50

## 2012-02-18 MED ORDER — POTASSIUM CHLORIDE 10 MEQ/50ML IV SOLN
INTRAVENOUS | Status: AC
  Administered 2012-02-18: 16:00:00
  Filled 2012-02-18: qty 50

## 2012-02-18 NOTE — Progress Notes (Signed)
Pt to receive Amiodarone 400mg  po this pm. Pt currently being paced AAI 70, SB 50's underneath. Called Dr Dorris Fetch to verify that pt is to receive Amiodarone, Dr Dorris Fetch said it's okay to administer Amiodarone as ordered.

## 2012-02-18 NOTE — Progress Notes (Signed)
3 Days Post-Op Procedure(s) (LRB): TRICUSPID VALVE REPAIR (N/A) MAZE (N/A) INTRAOPERATIVE TRANSESOPHAGEAL ECHOCARDIOGRAM (N/A) MITRAL VALVE (MV) REPLACEMENT (N/A) Subjective: Feels a little better today  Objective: Vital signs in last 24 hours: Temp:  [98 F (36.7 [name])-99.9 F (37.7 [name])] 99 F (37.2 [name]) (02/08 0803) Pulse Rate:  [40-74] 72 (02/08 0800) Cardiac Rhythm:  [-] Atrial paced (02/08 0800) Resp:  [17-30] 21 (02/08 0800) BP: (84-126)/(37-90) 107/46 mmHg (02/08 0800) SpO2:  [88 %-100 %] 99 % (02/08 0800) Arterial Line BP: (133)/(60) 133/60 mmHg (02/07 0900) Weight:  [236 lb 15.9 oz (107.5 kg)] 236 lb 15.9 oz (107.5 kg) (02/08 0500)  Hemodynamic parameters for last 24 hours:    Intake/Output from previous day: 02/07 0701 - 02/08 0700 In: 2360 [P.O.:970; I.V.:940; IV Piggyback:450] Out: 1415 [Urine:1415] Intake/Output this shift: Total I/O In: 280 [P.O.:240; I.V.:40] Out: 125 [Urine:125]  General appearance: alert and no distress Heart: regular rate and rhythm Lungs: diminished breath sounds bilaterally  Lab Results:  Recent Labs  02/17/12 0400 02/18/12 0401  WBC 9.7 9.6  HGB 9.0* 8.4*  HCT 27.8* 25.6*  PLT 106* 111*   BMET:  Recent Labs  02/17/12 0400 02/18/12 0401  NA 136 134*  K 4.3 3.8  CL 102 99  CO2 26 29  GLUCOSE 82 87  BUN 19 24*  CREATININE 1.00 0.96  CALCIUM 8.6 8.4    PT/INR:  Recent Labs  02/18/12 0401  LABPROT 15.7*  INR 1.28   ABG    Component Value Date/Time   PHART 7.338* 02/16/2012 0432   HCO3 23.0 02/16/2012 0432   TCO2 24 02/16/2012 1704   ACIDBASEDEF 2.0 02/16/2012 0432   O2SAT 90.0 02/16/2012 0432   CBG (last 3)   Recent Labs  02/17/12 2035 02/17/12 2347 02/18/12 0801  GLUCAP 124* 133* 79    Assessment/Plan: S/P Procedure(s) (LRB): TRICUSPID VALVE REPAIR (N/A) MAZE (N/A) INTRAOPERATIVE TRANSESOPHAGEAL ECHOCARDIOGRAM (N/A) MITRAL VALVE (MV) REPLACEMENT (N/A) -POD # 3 MVR, TV repair, maze CV- some bradycardia  last night- a paced  Coumadin for mechanical valve  RESP- pulmonary hygiene  Presumed pneumonia- on Vanco + Fortaz- sputum culture pending  RENAL- UO 75- 125/ hr on lasix gtt  Deconditioning- OOB ambulate   LOS: 3 days    [name] 02/18/2012

## 2012-02-18 NOTE — Progress Notes (Signed)
Feels a little better this PM  BP 99/63  Pulse 70  Temp(Src) 98.6 F (37 C) (Oral)  Resp 24  Ht 5\' 6"  (1.676 m)  Wt 236 lb 15.9 oz (107.5 kg)  BMI 38.27 kg/m2  SpO2 100%   Intake/Output Summary (Last 24 hours) at 02/18/12 1802 Last data filed at 02/18/12 1700  Gross per 24 hour  Intake   2600 ml  Output   1855 ml  Net    745 ml    Modest diuresis with 10 mg/ hr lasix gtt

## 2012-02-19 ENCOUNTER — Inpatient Hospital Stay (HOSPITAL_COMMUNITY): Payer: BC Managed Care – PPO

## 2012-02-19 LAB — TYPE AND SCREEN
DAT, IgG: NEGATIVE
Donor AG Type: NEGATIVE
PT AG Type: NEGATIVE
Unit division: 0
Unit division: 0
Unit division: 0

## 2012-02-19 LAB — GLUCOSE, CAPILLARY
Glucose-Capillary: 136 mg/dL — ABNORMAL HIGH (ref 70–99)
Glucose-Capillary: 108 mg/dL — ABNORMAL HIGH (ref 70–99)
Glucose-Capillary: 98 mg/dL (ref 70–99)

## 2012-02-19 LAB — BASIC METABOLIC PANEL
BUN: 22 mg/dL (ref 6–23)
Creatinine, Ser: 0.79 mg/dL (ref 0.50–1.10)
GFR calc Af Amer: >90 mL/min (ref >90)
GFR calc non Af Amer: >90 mL/min (ref >90)
Potassium: 3.9 mEq/L (ref 3.5–5.1)

## 2012-02-19 LAB — CBC
HCT: 25.3 % — ABNORMAL LOW (ref 36.0–46.0)
MCHC: 32 g/dL (ref 30.0–36.0)
MCV: 94.8 fL (ref 78.0–100.0)
Platelets: 122 10*3/uL — ABNORMAL LOW (ref 150–400)
RDW: 13.2 % (ref 11.5–15.5)

## 2012-02-19 LAB — PROTIME-INR: INR: 1.64 — ABNORMAL HIGH (ref 0.00–1.49)

## 2012-02-19 MED ORDER — DEXTROMETHORPHAN POLISTIREX 30 MG/5ML PO LQCR
30.0000 mg | Freq: Four times a day (QID) | ORAL | Status: DC | PRN
  Filled 2012-02-19: qty 5

## 2012-02-19 MED ORDER — METOPROLOL TARTRATE 25 MG PO TABS
25.0000 mg | ORAL_TABLET | Freq: Two times a day (BID) | ORAL | Status: DC
  Administered 2012-02-19 – 2012-02-25 (×10): 25 mg via ORAL
  Filled 2012-02-19 (×14): qty 1

## 2012-02-19 MED ORDER — POTASSIUM CHLORIDE CRYS ER 20 MEQ PO TBCR
20.0000 meq | EXTENDED_RELEASE_TABLET | Freq: Two times a day (BID) | ORAL | Status: DC
  Administered 2012-02-19 – 2012-02-23 (×10): 20 meq via ORAL
  Filled 2012-02-19 (×13): qty 1

## 2012-02-19 MED ORDER — GUAIFENESIN ER 600 MG PO TB12
1200.0000 mg | ORAL_TABLET | Freq: Two times a day (BID) | ORAL | Status: DC
  Administered 2012-02-19 – 2012-02-25 (×13): 1200 mg via ORAL
  Filled 2012-02-19 (×14): qty 2

## 2012-02-19 NOTE — Progress Notes (Signed)
Ambulated twice today  BP 92/64  Pulse 137  Temp(Src) 97.8 F (36.6 C) (Oral)  Resp 18  Ht 5\' 6"  (1.676 m)  Wt 234 lb 2.1 oz (106.2 kg)  BMI 37.81 kg/m2  SpO2 99%   Intake/Output Summary (Last 24 hours) at 02/19/12 1831 Last data filed at 02/19/12 1700  Gross per 24 hour  Intake   1600 ml  Output   2815 ml  Net  -1215 ml    Diuresing better  Looks better

## 2012-02-19 NOTE — Progress Notes (Signed)
4 Days Post-Op Procedure(s) (LRB): TRICUSPID VALVE REPAIR (N/A) MAZE (N/A) INTRAOPERATIVE TRANSESOPHAGEAL ECHOCARDIOGRAM (N/A) MITRAL VALVE (MV) REPLACEMENT (N/A) Subjective: Feels better but still having difficulty clearing mucous  Objective: Vital signs in last 24 hours: Temp:  [98.2 F (36.8 C)-99.4 F (37.4 C)] 98.2 F (36.8 C) (02/09 0804) Pulse Rate:  [61-147] 95 (02/09 0800) Cardiac Rhythm:  [-] Ventricular paced (02/09 0800) Resp:  [16-27] 20 (02/09 0800) BP: (87-122)/(38-71) 106/67 mmHg (02/09 0800) SpO2:  [93 %-100 %] 100 % (02/09 0800) Weight:  [234 lb 2.1 oz (106.2 kg)] 234 lb 2.1 oz (106.2 kg) (02/09 0600)  Hemodynamic parameters for last 24 hours:    Intake/Output from previous day: 02/08 0701 - 02/09 0700 In: 2390 [P.O.:1060; I.V.:730; IV Piggyback:600] Out: 2175 [Urine:2175] Intake/Output this shift: Total I/O In: 30 [I.V.:30] Out: 350 [Urine:350]  General appearance: alert and no distress Heart: irregularly irregular rhythm Lungs: congested with mild rhochi bilaterally  Lab Results:  Recent Labs  02/18/12 0401 02/19/12 0414  WBC 9.6 8.4  HGB 8.4* 8.1*  HCT 25.6* 25.3*  PLT 111* 122*   BMET:  Recent Labs  02/18/12 0401 02/19/12 0414  NA 134* 137  K 3.8 3.9  CL 99 100  CO2 29 31  GLUCOSE 87 112*  BUN 24* 22  CREATININE 0.96 0.79  CALCIUM 8.4 8.5    PT/INR:  Recent Labs  02/19/12 0414  LABPROT 18.9*  INR 1.64*   ABG    Component Value Date/Time   PHART 7.338* 02/16/2012 0432   HCO3 23.0 02/16/2012 0432   TCO2 24 02/16/2012 1704   ACIDBASEDEF 2.0 02/16/2012 0432   O2SAT 90.0 02/16/2012 0432   CBG (last 3)   Recent Labs  02/18/12 1731 02/18/12 2156 02/19/12 0803  GLUCAP 91 140* 179*    Assessment/Plan: S/P Procedure(s) (LRB): TRICUSPID VALVE REPAIR (N/A) MAZE (N/A) INTRAOPERATIVE TRANSESOPHAGEAL ECHOCARDIOGRAM (N/A) MITRAL VALVE (MV) REPLACEMENT (N/A) - CV- in a fib on amiodarone and lopressor- rate is controlled.  Increase lopressor  Anticoagulation for mechanical valve- INR increasing - continue coumadin  RESP- on Vanco and fortaz, still with a lot of secretions- will add delsyn and mucinex  RENAL- starting to diurese, continue lasix gtt, creatinine OK  DM- CBG control adequate  Increase mobilization   LOS: 4 days    Lizette Pazos C 02/19/2012

## 2012-02-20 ENCOUNTER — Inpatient Hospital Stay (HOSPITAL_COMMUNITY): Payer: BC Managed Care – PPO

## 2012-02-20 LAB — CBC
Platelets: 142 10*3/uL — ABNORMAL LOW (ref 150–400)
RBC: 2.52 MIL/uL — ABNORMAL LOW (ref 3.87–5.11)
RDW: 13.2 % (ref 11.5–15.5)
WBC: 7.8 10*3/uL (ref 4.0–10.5)

## 2012-02-20 LAB — VANCOMYCIN, TROUGH: Vancomycin Tr: 13.7 ug/mL (ref 10.0–20.0)

## 2012-02-20 LAB — GLUCOSE, CAPILLARY
Glucose-Capillary: 104 mg/dL — ABNORMAL HIGH (ref 70–99)
Glucose-Capillary: 100 mg/dL — ABNORMAL HIGH (ref 70–99)

## 2012-02-20 LAB — CULTURE, RESPIRATORY W GRAM STAIN

## 2012-02-20 LAB — BASIC METABOLIC PANEL
CO2: 34 mEq/L — ABNORMAL HIGH (ref 19–32)
Chloride: 99 mEq/L (ref 96–112)
GFR calc Af Amer: >90 mL/min (ref >90)
Potassium: 3.3 mEq/L — ABNORMAL LOW (ref 3.5–5.1)

## 2012-02-20 LAB — PROTIME-INR: INR: 2.26 — ABNORMAL HIGH (ref 0.00–1.49)

## 2012-02-20 MED ORDER — FUROSEMIDE 10 MG/ML IJ SOLN
40.0000 mg | Freq: Two times a day (BID) | INTRAMUSCULAR | Status: DC
  Administered 2012-02-20 – 2012-02-21 (×3): 40 mg via INTRAVENOUS
  Filled 2012-02-20 (×5): qty 4

## 2012-02-20 MED ORDER — POTASSIUM CHLORIDE 10 MEQ/50ML IV SOLN
10.0000 meq | INTRAVENOUS | Status: AC
  Administered 2012-02-20 (×3): 10 meq via INTRAVENOUS
  Filled 2012-02-20: qty 150

## 2012-02-20 MED ORDER — VANCOMYCIN HCL 10 G IV SOLR
1250.0000 mg | Freq: Two times a day (BID) | INTRAVENOUS | Status: DC
  Administered 2012-02-20 – 2012-02-24 (×8): 1250 mg via INTRAVENOUS
  Filled 2012-02-20 (×9): qty 1250

## 2012-02-20 NOTE — Progress Notes (Addendum)
TCTS DAILY PROGRESS NOTE                   [address]            [place] [address]          [phone]      5 Days Post-Op Procedure(s) (LRB): TRICUSPID VALVE REPAIR (N/A) MAZE (N/A) INTRAOPERATIVE TRANSESOPHAGEAL ECHOCARDIOGRAM (N/A) MITRAL VALVE (MV) REPLACEMENT (N/A)  Total Length of Stay:  LOS: 5 days   Subjective: OOB in chair.  Stable weekend.  Walked in halls, +Productive cough, but improving.  No additional complaints.   Objective: Vital signs in last 24 hours: Temp:  [97.6 F (36.4 C)-98.5 F (36.9 C)] 97.6 F (36.4 C) (02/10 0400) Pulse Rate:  [48-95] 67 (02/10 0700) Cardiac Rhythm:  [-] Atrial fibrillation (02/10 0300) Resp:  [16-27] 16 (02/10 0700) BP: (86-124)/(45-104) 114/54 mmHg (02/10 0700) SpO2:  [97 %-100 %] 100 % (02/10 0700) Weight:  [231 lb 4.2 oz (104.9 kg)] 231 lb 4.2 oz (104.9 kg) (02/10 0500)  Filed Weights   02/18/12 0500 02/19/12 0600 02/20/12 0500  Weight: 236 lb 15.9 oz (107.5 kg) 234 lb 2.1 oz (106.2 kg) 231 lb 4.2 oz (104.9 kg)    Weight change: -2 lb 13.9 oz (-1.3 kg)   CBGs 136-108-98-165    Intake/Output from previous day: 02/09 0701 - 02/10 0700 In: 1590 [P.O.:480; I.V.:660; IV Piggyback:450] Out: 3735 [Urine:3735]  Intake/Output this shift:    Current Meds: Scheduled Meds: . acetaminophen  1,000 mg Oral Q6H  . amiodarone  400 mg Oral BID  . aspirin EC  81 mg Oral Daily  . azithromycin  250 mg Oral Daily  . bisacodyl  10 mg Oral Daily   Or  . bisacodyl  10 mg Rectal Daily  . cefTRIAXone (ROCEPHIN)  IV  1 g Intravenous Q24H  . docusate sodium  200 mg Oral Daily  . enoxaparin (LOVENOX) injection  40 mg Subcutaneous Q24H  . guaiFENesin  1,200 mg Oral BID  . insulin aspart  0-24 Units Subcutaneous TID AC & HS  . insulin detemir  20 Units Subcutaneous BID  . metoprolol tartrate  25 mg Oral BID  . pantoprazole  40 mg Oral Daily  . potassium chloride  20 mEq Oral BID  . sodium chloride  10-40 mL  Intracatheter Q12H  . sodium chloride  3 mL Intravenous Q12H  . vancomycin  1,000 mg Intravenous Q12H  . warfarin  5 mg Oral q1800  . Warfarin - Physician Dosing Inpatient   Does not apply q1800   Continuous Infusions: . furosemide (LASIX) infusion 10 mg/hr (02/20/12 0200)   PRN Meds:.sodium chloride, dextromethorphan, morphine injection, oxyCODONE, sodium chloride, sodium chloride, traMADol   Physical Exam: General appearance: alert, cooperative and no distress Heart: regular rate and rhythm Lungs: Few coarse BS bilaterally Extremities: Mild LE edema Wound: Clean and dry  Lab Results: CBC: Recent Labs  02/19/12 0414 02/20/12 0413  WBC 8.4 7.8  HGB 8.1* 7.6*  HCT 25.3* 23.8*  PLT 122* 142*   BMET:  Recent Labs  02/19/12 0414 02/20/12 0413  NA 137 138  K 3.9 3.3*  CL 100 99  CO2 31 34*  GLUCOSE 112* 165*  BUN 22 14  CREATININE 0.79 0.64  CALCIUM 8.5 8.3*    PT/INR:  Recent Labs  02/20/12 0413  LABPROT 24.0*  INR 2.26*   Radiology: Dg Chest Port 1 View  02/19/2012  *RADIOLOGY REPORT*  Clinical Data:  Follow up pulmonary edema  PORTABLE CHEST - 1 VIEW  Comparison: Prior chest x-ray 02/18/2012  Findings: Stable position of right upper extremity PICC.  The catheter tip projects over the lower SVC.  Surgical changes of prior median sternotomy and metallic valve replacement.  Stable cardiomegaly.  Similar to slightly improved pulmonary edema. Central vascular congestion persists.  IMPRESSION:  1.  Similar to slightly improved interstitial edema with persistent pulmonary vascular congestion. 2.  Stable cardiomegaly   Original Report Authenticated By: Malachy Moan, M.D.    Sputum Cx= few Candida albicans  Assessment/Plan: S/P Procedure(s) (LRB): TRICUSPID VALVE REPAIR (N/A) MAZE (N/A) INTRAOPERATIVE TRANSESOPHAGEAL ECHOCARDIOGRAM (N/A) MITRAL VALVE (MV) REPLACEMENT (N/A) CV- Maintaining rate controlled AF. Continue Amio, Lopressor, Coumadin for mechanical  valve. Vol overload- excellent diuresis.  Will d/c IV Lasix and switch to 40 bid. Hypokalemia- Replace K+. Pulm- continue pulm toilet,IS.  On Vanc/Rocephin. May need to add Diflucan- will d/w MD. Expected postop blood loss anemia- monitor.  H/H down slightly today. Deconditioning -CRPI. Leave in unit one more day, continue Foley for diuresis.       [name] H 02/20/2012 7:43 AM

## 2012-02-20 NOTE — Progress Notes (Signed)
Patient ID: [name], female   DOB: 07/23/1955, 57 y.o.   MRN: [id number]                   [address]            [place] [address]          [phone]     5 Days Post-Op Procedure(s) (LRB): TRICUSPID VALVE REPAIR (N/A) MAZE (N/A) INTRAOPERATIVE TRANSESOPHAGEAL ECHOCARDIOGRAM (N/A) MITRAL VALVE (MV) REPLACEMENT (N/A)  Total Length of Stay:  LOS: 5 days  BP 111/63  Pulse 93  Temp(Src) 97.9 F (36.6 C) (Oral)  Resp 15  Ht 5\' 6"  (1.676 m)  Wt 231 lb 4.2 oz (104.9 kg)  BMI 37.34 kg/m2  SpO2 100%  .Intake/Output     02/09 0701 - 02/10 0700 02/10 0701 - 02/11 0700   P.O. 480 300   I.V. (mL/kg) 660 (6.3) 80 (0.8)   IV Piggyback 450 450   Total Intake(mL/kg) 1590 (15.2) 830 (7.9)   Urine (mL/kg/hr) 3735 (1.5) 1430 (1.2)   Stool  2 (0)   Total Output 3735 1432   Net -2145 -602        Stool Occurrence 2 x          Lab Results  Component Value Date   WBC 7.8 02/20/2012   HGB 7.6* 02/20/2012   HCT 23.8* 02/20/2012   PLT 142* 02/20/2012   GLUCOSE 165* 02/20/2012   CHOL 133 09/18/2010   TRIG 100 09/18/2010   HDL 25* 09/18/2010   LDLCALC 88 09/18/2010   ALT 13 02/13/2012   AST 24 02/13/2012   NA 138 02/20/2012   K 3.3* 02/20/2012   CL 99 02/20/2012   CREATININE 0.64 02/20/2012   BUN 14 02/20/2012   CO2 34* 02/20/2012   TSH 2.479 12/19/2011   INR 2.26* 02/20/2012   HGBA1C 8.7* 02/13/2012  off lasix drip Up to chair On coumadin Lab Results  Component Value Date   INR 2.26* 02/20/2012   INR 1.64* 02/19/2012   INR 1.28 02/18/2012       Delight Ovens MD  Beeper [phone] Office [phone] 02/20/2012 6:34 PM

## 2012-02-20 NOTE — Progress Notes (Signed)
ANTIBIOTIC CONSULT NOTE - FOLLOW UP  Pharmacy Consult for vancomycin Indication: suspected pre-op tracheobronchitis?  No Known Allergies  Patient Measurements: Height: 5\' 6"  (167.6 cm) Weight: 231 lb 4.2 oz (104.9 kg) IBW/kg (Calculated) : 59.3   Vital Signs: Temp: 98 F (36.7 C) (02/10 0749) Temp src: Oral (02/10 0749) BP: 118/67 mmHg (02/10 1000) Pulse Rate: 93 (02/10 1000) Intake/Output from previous day: 02/09 0701 - 02/10 0700 In: 1590 [P.O.:480; I.V.:660; IV Piggyback:450] Out: 3735 [Urine:3735] Intake/Output from this shift: Total I/O In: 360 [P.O.:150; I.V.:60; IV Piggyback:150] Out: 1200 [Urine:1200]  Labs:  Recent Labs  02/18/12 0401 02/19/12 0414 02/20/12 0413  WBC 9.6 8.4 7.8  HGB 8.4* 8.1* 7.6*  PLT 111* 122* 142*  CREATININE 0.96 0.79 0.64   Estimated Creatinine Clearance: 94.9 ml/min (by C-G formula based on Cr of 0.64).  Recent Labs  02/20/12 0952  VANCOTROUGH 13.7     Microbiology: Recent Results (from the past 720 hour(s))  CULTURE, EXPECTORATED SPUTUM-ASSESSMENT     Status: None   Collection Time    02/18/12  2:26 AM      Result Value Range Status   Specimen Description SPUTUM   Final   Special Requests NONE   Final   Sputum evaluation     Final   Value: THIS SPECIMEN IS ACCEPTABLE. RESPIRATORY CULTURE REPORT TO FOLLOW.   Report Status 02/18/2012 FINAL   Final  CULTURE, RESPIRATORY (NON-EXPECTORATED)     Status: None   Collection Time    02/18/12  2:26 AM      Result Value Range Status   Specimen Description SPUTUM   Final   Special Requests NONE   Final   Gram Stain PENDING   Incomplete   Culture MODERATE CANDIDA ALBICANS   Final   Report Status PENDING   Incomplete    Anti-infectives   Start     Dose/Rate Route Frequency Ordered Stop   02/17/12 1130  cefTRIAXone (ROCEPHIN) 1 g in dextrose 5 % 50 mL IVPB     1 g 100 mL/hr over 30 Minutes Intravenous Every 24 hours 02/17/12 1022 02/24/12 1129   02/17/12 1100  vancomycin  (VANCOCIN) IVPB 1000 mg/200 mL premix     1,000 mg 200 mL/hr over 60 Minutes Intravenous Every 12 hours 02/17/12 1035     02/17/12 1000  azithromycin (ZITHROMAX) tablet 250 mg     250 mg Oral Daily 02/16/12 0801 02/20/12 1003   02/16/12 1000  azithromycin (ZITHROMAX) tablet 500 mg     500 mg Oral Daily 02/16/12 0801 02/16/12 0939   02/15/12 2345  vancomycin (VANCOCIN) IVPB 1000 mg/200 mL premix     1,000 mg 200 mL/hr over 60 Minutes Intravenous  Once 02/15/12 1610 02/15/12 2313   02/15/12 2200  cefUROXime (ZINACEF) 1.5 g in dextrose 5 % 50 mL IVPB  Status:  Discontinued     1.5 g 100 mL/hr over 30 Minutes Intravenous Every 12 hours 02/15/12 1610 02/17/12 1017   02/15/12 0400  vancomycin (VANCOCIN) 1,250 mg in sodium chloride 0.9 % 250 mL IVPB     1,250 mg 166.7 mL/hr over 90 Minutes Intravenous To Surgery 02/14/12 1620 02/15/12 0830   02/15/12 0400  cefUROXime (ZINACEF) 1.5 g in dextrose 5 % 50 mL IVPB     1.5 g 100 mL/hr over 30 Minutes Intravenous To Surgery 02/14/12 1620 02/15/12 1429   02/15/12 0400  cefUROXime (ZINACEF) 750 mg in dextrose 5 % 50 mL IVPB  Status:  Discontinued  750 mg 100 mL/hr over 30 Minutes Intravenous To Surgery 02/14/12 1620 02/15/12 1535      Assessment: Patient is a 57 y.o F on vancomycin day #6 for suspected PNA.  Patient is afebrile, wbc wnl, and all cultures have been negative thus far.  Renal function is stable.  Vancomycin trough now back slightly subtherapeutic at 13.7.  Goal of Therapy:  Vancomycin trough level 15-20 mcg/ml  Plan:  1) Increase vancomycin dose to 1250mg  IV q12  Chinmayi Rumer P 02/20/2012,12:35 PM

## 2012-02-21 LAB — PROTIME-INR
INR: 3.17 — ABNORMAL HIGH (ref 0.00–1.49)
Prothrombin Time: 30.8 seconds — ABNORMAL HIGH (ref 11.6–15.2)

## 2012-02-21 LAB — GLUCOSE, CAPILLARY
Glucose-Capillary: 71 mg/dL (ref 70–99)
Glucose-Capillary: 89 mg/dL (ref 70–99)
Glucose-Capillary: 97 mg/dL (ref 70–99)

## 2012-02-21 LAB — BASIC METABOLIC PANEL
Chloride: 101 mEq/L (ref 96–112)
Creatinine, Ser: 0.53 mg/dL (ref 0.50–1.10)
GFR calc Af Amer: >90 mL/min (ref >90)
Potassium: 3.7 mEq/L (ref 3.5–5.1)
Sodium: 140 mEq/L (ref 135–145)

## 2012-02-21 LAB — CBC
HCT: 25.4 % — ABNORMAL LOW (ref 36.0–46.0)
RBC: 2.68 MIL/uL — ABNORMAL LOW (ref 3.87–5.11)
RDW: 13.4 % (ref 11.5–15.5)
WBC: 9.3 10*3/uL (ref 4.0–10.5)

## 2012-02-21 MED ORDER — MOVING RIGHT ALONG BOOK
Freq: Once | Status: AC
  Administered 2012-02-21: 18:00:00
  Filled 2012-02-21: qty 1

## 2012-02-21 MED ORDER — SODIUM CHLORIDE 0.9 % IJ SOLN
3.0000 mL | INTRAMUSCULAR | Status: DC | PRN
  Administered 2012-02-24: 10 mL via INTRAVENOUS

## 2012-02-21 MED ORDER — LINAGLIPTIN 5 MG PO TABS
5.0000 mg | ORAL_TABLET | Freq: Every day | ORAL | Status: DC
  Administered 2012-02-22 – 2012-02-25 (×4): 5 mg via ORAL
  Filled 2012-02-21 (×5): qty 1

## 2012-02-21 MED ORDER — METFORMIN HCL ER 500 MG PO TB24
1000.0000 mg | ORAL_TABLET | Freq: Every day | ORAL | Status: DC
  Administered 2012-02-22 – 2012-02-25 (×4): 1000 mg via ORAL
  Filled 2012-02-21 (×5): qty 2

## 2012-02-21 MED ORDER — WARFARIN SODIUM 2.5 MG PO TABS
2.5000 mg | ORAL_TABLET | Freq: Every day | ORAL | Status: DC
  Filled 2012-02-21 (×2): qty 1

## 2012-02-21 MED ORDER — FLUCONAZOLE 100 MG PO TABS
100.0000 mg | ORAL_TABLET | Freq: Every day | ORAL | Status: AC
  Administered 2012-02-21 – 2012-02-25 (×5): 100 mg via ORAL
  Filled 2012-02-21 (×5): qty 1

## 2012-02-21 MED ORDER — FUROSEMIDE 40 MG PO TABS
40.0000 mg | ORAL_TABLET | Freq: Two times a day (BID) | ORAL | Status: DC
  Administered 2012-02-21 – 2012-02-25 (×8): 40 mg via ORAL
  Filled 2012-02-21 (×10): qty 1

## 2012-02-21 MED ORDER — SITAGLIPTIN PHOS-METFORMIN HCL 50-1000 MG PO TABS: 1.0000 | ORAL_TABLET | Freq: Every day | ORAL | Status: DC

## 2012-02-21 MED ORDER — SODIUM CHLORIDE 0.9 % IV SOLN: 250.0000 mL | INTRAVENOUS | Status: DC | PRN

## 2012-02-21 MED ORDER — SODIUM CHLORIDE 0.9 % IJ SOLN: 3.0000 mL | Freq: Two times a day (BID) | INTRAMUSCULAR | Status: DC

## 2012-02-21 NOTE — Progress Notes (Addendum)
[facility] DAILY PROGRESS NOTE                   [address]            [place] [address]          [phone]      6 Days Post-Op Procedure(s) (LRB): TRICUSPID VALVE REPAIR (N/A) MAZE (N/A) INTRAOPERATIVE TRANSESOPHAGEAL ECHOCARDIOGRAM (N/A) MITRAL VALVE (MV) REPLACEMENT (N/A)  Total Length of Stay:  LOS: 6 days   Subjective: Feels well this am. Not coughing as much.  Walked several times yesterday and already walked this morning. In SR this am.   Objective: Vital signs in last 24 hours: Temp:  [97.4 F (36.3 C)-98.5 F (36.9 C)] 97.4 F (36.3 C) (02/11 0741) Pulse Rate:  [78-111] 95 (02/11 0700) Cardiac Rhythm:  [-] Atrial fibrillation (02/11 0400) Resp:  [15-31] 16 (02/11 0700) BP: (87-126)/(45-78) 101/62 mmHg (02/11 0700) SpO2:  [92 %-100 %] 100 % (02/11 0700) Weight:  [228 lb 13.4 oz (103.8 kg)] 228 lb 13.4 oz (103.8 kg) (02/11 0600)  Filed Weights   02/19/12 0600 02/20/12 0500 02/21/12 0600  Weight: 234 lb 2.1 oz (106.2 kg) 231 lb 4.2 oz (104.9 kg) 228 lb 13.4 oz (103.8 kg)  2/11 Wt= 103.8 kg  Weight change: -2 lb 6.8 oz (-1.1 kg)   CBGs: 97-100-86     Intake/Output from previous day: 02/10 0701 - 02/11 0700 In: 1785 [P.O.:880; I.V.:205; IV Piggyback:700] Out: 2238 [Urine:2235; Stool:3]  Intake/Output this shift:    Current Meds: Scheduled Meds: . amiodarone  400 mg Oral BID  . aspirin EC  81 mg Oral Daily  . bisacodyl  10 mg Oral Daily   Or  . bisacodyl  10 mg Rectal Daily  . cefTRIAXone (ROCEPHIN)  IV  1 g Intravenous Q24H  . docusate sodium  200 mg Oral Daily  . furosemide  40 mg Intravenous BID  . guaiFENesin  1,200 mg Oral BID  . insulin aspart  0-24 Units Subcutaneous TID AC & HS  . insulin detemir  20 Units Subcutaneous BID  . metoprolol tartrate  25 mg Oral BID  . pantoprazole  40 mg Oral Daily  . potassium chloride  20 mEq Oral BID  . sodium chloride  10-40 mL Intracatheter Q12H  . sodium chloride  3 mL Intravenous Q12H    . vancomycin  1,250 mg Intravenous Q12H  . warfarin  5 mg Oral q1800  . Warfarin - Physician Dosing Inpatient   Does not apply q1800   Continuous Infusions:  PRN Meds:.sodium chloride, dextromethorphan, morphine injection, oxyCODONE, sodium chloride, sodium chloride, traMADol   Physical Exam: General appearance: alert, cooperative and no distress Heart: regular rate and rhythm Lungs: rhonchi bilaterally Extremities: edema Mild bilateral LE Wound: Clean and dry    Lab Results: CBC: Recent Labs  02/20/12 0413 02/21/12 0453  WBC 7.8 9.3  HGB 7.6* 8.1*  HCT 23.8* 25.4*  PLT 142* 177   BMET:  Recent Labs  02/20/12 0413 02/21/12 0453  NA 138 140  K 3.3* 3.7  CL 99 101  CO2 34* 33*  GLUCOSE 165* 86  BUN 14 11  CREATININE 0.64 0.53  CALCIUM 8.3* 8.8    PT/INR:  Recent Labs  02/21/12 0453  LABPROT 30.8*  INR 3.17*     Radiology: Dg Chest Port 1 View  02/20/2012  *RADIOLOGY REPORT*  Clinical Data: Follow up atelectasis.  Status post tricuspid valve repair.  PORTABLE CHEST - 1  VIEW  Comparison: One-view chest 02/19/2012.  Findings: Postoperative changes of the heart are again noted including a tricuspid valve repair and clipping of the atrial appendage.  The right-sided PICC line is stable in position.  The lung volumes remain low.  Lung volumes are slightly improved with some improvement in bibasilar atelectasis as well.  Mild pulmonary vascular congestion persists.  IMPRESSION:  1.  Slightly improved aeration at the lung bases with mild persistent atelectasis. 2.  Support apparatus is stable. 3.  Persistent low lung volumes and mild pulmonary vascular congestion.   Original Report Authenticated By: Marin Roberts, M.D.      Assessment/Plan: S/P Procedure(s) (LRB): TRICUSPID VALVE REPAIR (N/A) MAZE (N/A) INTRAOPERATIVE TRANSESOPHAGEAL ECHOCARDIOGRAM (N/A) MITRAL VALVE (MV) REPLACEMENT (N/A)  CV- In and out of AF overnight, but in SR this am.  SBPs have been  low, 80s-110s, but she is tolerating it well.  May need to decrease Lopressor dose.    Anticoagulation- INR therapeutic.  May need to hold dose so we can d/c pacing wires.  Vol overload- diuresing well with IV Lasix.  Hypokalemia- replace K+.  DM- sugars stable on Levemir.  Restart po meds as able.  Expected postop blood loss anemia- H/H stable.  Pulm- Sputum cx + Moderate candida albicans.  Will add Diflucan. She is still on Vanc/Rocephin/Zithromax.  Hopefully can d/c Foley, transfer to [facility] today.  Continue CRPI, pulm toilet.       COLLINS,GINA H 02/21/2012 7:42 AM  Patient seen and examined. Agree with above Will change lasix to PO Dc foley Hold coumadin today Restart at 2.5 mg daily tomorrow

## 2012-02-22 ENCOUNTER — Inpatient Hospital Stay (HOSPITAL_COMMUNITY): Payer: BC Managed Care – PPO

## 2012-02-22 LAB — BASIC METABOLIC PANEL
CO2: 32 mEq/L (ref 19–32)
Calcium: 8.9 mg/dL (ref 8.4–10.5)
Creatinine, Ser: 0.58 mg/dL (ref 0.50–1.10)
GFR calc Af Amer: >90 mL/min (ref >90)
GFR calc non Af Amer: >90 mL/min (ref >90)
Sodium: 138 mEq/L (ref 135–145)

## 2012-02-22 LAB — CBC
MCH: 30.5 pg (ref 26.0–34.0)
MCV: 95 fL (ref 78.0–100.0)
Platelets: 211 10*3/uL (ref 150–400)
RBC: 2.59 MIL/uL — ABNORMAL LOW (ref 3.87–5.11)
RDW: 13.5 % (ref 11.5–15.5)
WBC: 10.3 10*3/uL (ref 4.0–10.5)

## 2012-02-22 LAB — PROTIME-INR: Prothrombin Time: 34.6 seconds — ABNORMAL HIGH (ref 11.6–15.2)

## 2012-02-22 LAB — GLUCOSE, CAPILLARY
Glucose-Capillary: 100 mg/dL — ABNORMAL HIGH (ref 70–99)
Glucose-Capillary: 86 mg/dL (ref 70–99)
Glucose-Capillary: 94 mg/dL (ref 70–99)

## 2012-02-22 MED ORDER — IRBESARTAN 150 MG PO TABS
150.0000 mg | ORAL_TABLET | Freq: Every day | ORAL | Status: DC
  Administered 2012-02-22 – 2012-02-25 (×4): 150 mg via ORAL
  Filled 2012-02-22 (×4): qty 1

## 2012-02-22 MED ORDER — ATORVASTATIN CALCIUM 40 MG PO TABS
40.0000 mg | ORAL_TABLET | Freq: Every evening | ORAL | Status: DC
  Administered 2012-02-22 – 2012-02-24 (×3): 40 mg via ORAL
  Filled 2012-02-22 (×4): qty 1

## 2012-02-22 NOTE — Progress Notes (Signed)
Pt and belongings transferred to 2035. Family updated. [name]

## 2012-02-22 NOTE — Progress Notes (Addendum)
7 Days Post-Op Procedure(s) (LRB): TRICUSPID VALVE REPAIR (N/A) MAZE (N/A) INTRAOPERATIVE TRANSESOPHAGEAL ECHOCARDIOGRAM (N/A) MITRAL VALVE (MV) REPLACEMENT (N/A) Subjective:  [name] has no complaints this morning.  She is ambulating +BM  Objective: Vital signs in last 24 hours: Temp:  [97.7 F (36.5 C)-98.4 F (36.9 C)] 98 F (36.7 C) (02/12 0440) Pulse Rate:  [93-102] 95 (02/12 0440) Cardiac Rhythm:  [-] Normal sinus rhythm (02/11 2030) Resp:  [18-23] 18 (02/12 0440) BP: (112-132)/(65-85) 132/82 mmHg (02/12 0440) SpO2:  [93 %-100 %] 98 % (02/12 0440) Weight:  [226 lb 4.8 oz (102.649 kg)] 226 lb 4.8 oz (102.649 kg) (02/12 0440)  Intake/Output from previous day: 02/11 0701 - 02/12 0700 In: 250 [P.O.:120; I.V.:30; IV Piggyback:100] Out: 770 [Urine:770]  General appearance: alert, cooperative and no distress Heart: regular rate and rhythm Lungs: clear to auscultation bilaterally Abdomen: soft, non-tender; bowel sounds normal; no masses,  no organomegaly Extremities: edema 1-2+ Wound: clean and dry  Lab Results:  Recent Labs  02/21/12 0453 02/22/12 0540  WBC 9.3 10.3  HGB 8.1* 7.9*  HCT 25.4* 24.6*  PLT 177 211   BMET:  Recent Labs  02/21/12 0453 02/22/12 0540  NA 140 138  K 3.7 3.8  CL 101 102  CO2 33* 32  GLUCOSE 86 101*  BUN 11 11  CREATININE 0.53 0.58  CALCIUM 8.8 8.9    PT/INR:  Recent Labs  02/22/12 0540  LABPROT 34.6*  INR 3.71*   ABG    Component Value Date/Time   PHART 7.338* 02/16/2012 0432   HCO3 23.0 02/16/2012 0432   TCO2 24 02/16/2012 1704   ACIDBASEDEF 2.0 02/16/2012 0432   O2SAT 90.0 02/16/2012 0432   CBG (last 3)   Recent Labs  02/21/12 1656 02/21/12 2157 02/22/12 0608  GLUCAP 89 97 100*    Assessment/Plan: S/P Procedure(s) (LRB): TRICUSPID VALVE REPAIR (N/A) MAZE (N/A) INTRAOPERATIVE TRANSESOPHAGEAL ECHOCARDIOGRAM (N/A) MITRAL VALVE (MV) REPLACEMENT (N/A)  1. CV- Previous Atrial Fibrillation, currently NSR on  Amiodarone, Lopressor 2. Pulm- + Candidiasis on sputum culture, on Vanc, Rocephin, Zithromax and Diflucan 3. INR 3.71- will decrease Coumadin to 2.5 mg daily 4. Expected Blood Loss Anemia- Hgb 7.9, stable 5. DM-sugars well controlled 6. Volume Overload- + LE edema, weight stable continue Lasix 7. Dispo- patient doing well, continue current care   LOS: 7 days    [name] 02/22/2012  I have seen and examined the patient and agree with the assessment and plan as outlined.  [name] 02/22/2012 9:44 AM

## 2012-02-22 NOTE — Progress Notes (Signed)
Pt INR elevated. Notfied MD on call; orders to hold evening dose of coumadin. Will continue to monitor.

## 2012-02-22 NOTE — Progress Notes (Signed)
CARDIAC REHAB PHASE I   PRE:  Rate/Rhythm: 98 SR    BP: sitting 124/75    SaO2: 94 RA  MODE:  Ambulation: 400 ft   POST:  Rate/Rhythm: 93 SR    BP: sitting 120/70     SaO2: 93 RA  Tolerated well with RW. Steady. Congested, not productive. No major c/o but pt quiet. To recliner, will f/u. [id number]  Harriet Masson CES, ACSM

## 2012-02-23 LAB — GLUCOSE, CAPILLARY
Glucose-Capillary: 91 mg/dL (ref 70–99)
Glucose-Capillary: 102 mg/dL — ABNORMAL HIGH (ref 70–99)

## 2012-02-23 LAB — PROTIME-INR
INR: 2.59 — ABNORMAL HIGH (ref 0.00–1.49)
Prothrombin Time: 26.5 seconds — ABNORMAL HIGH (ref 11.6–15.2)

## 2012-02-23 MED ORDER — ALTEPLASE 2 MG IJ SOLR
2.0000 mg | Freq: Once | INTRAMUSCULAR | Status: AC
  Administered 2012-02-23: 2 mg
  Filled 2012-02-23: qty 2

## 2012-02-23 MED ORDER — WARFARIN SODIUM 4 MG PO TABS
4.0000 mg | ORAL_TABLET | Freq: Every day | ORAL | Status: DC
  Administered 2012-02-23 – 2012-02-24 (×2): 4 mg via ORAL
  Filled 2012-02-23 (×3): qty 1

## 2012-02-23 NOTE — Progress Notes (Signed)
ANTIBIOTIC CONSULT NOTE - FOLLOW UP  Pharmacy Consult for vancomycin Indication: suspected pre-op tracheobronchitis?  No Known Allergies  Patient Measurements: Height: 5\' 6"  (167.6 cm) Weight: 227 lb 11.8 oz (103.3 kg) IBW/kg (Calculated) : 59.3   Vital Signs: Temp: 97.9 F (36.6 C) (02/13 0621) Temp src: Oral (02/13 0621) BP: 132/71 mmHg (02/13 0621) Pulse Rate: 93 (02/13 0621) Intake/Output from previous day: 02/12 0701 - 02/13 0700 In: 120 [P.O.:120] Out: 200 [Urine:200] Intake/Output from this shift:    Labs:  Recent Labs  02/21/12 0453 02/22/12 0540  WBC 9.3 10.3  HGB 8.1* 7.9*  PLT 177 211  CREATININE 0.53 0.58   Estimated Creatinine Clearance: 94.2 ml/min (by C-G formula based on Cr of 0.58).  Recent Labs  02/20/12 0952  VANCOTROUGH 13.7     Microbiology: 2/8 Sputum = moderate candida albicans  Antibiotics:  Vanc 2/5>>2/6; 2/7>> Ceftaz 2/5>>2/6 Azith 2/6 >> 2/10 CTX 2/7 >> stop date 2/14 Diflucan 2/11 >> stop date 2/16   Assessment: Patient is a 57 y.o F on vancomycin day #9 for suspected PNA.  Patient is afebrile, wbc wnl, and only positive culture is for candida for which patient is also receiving floconazole for 5 days.  Renal function is stable.    Noted Rocephin has stop date of 2/14.  Goal of Therapy:  Vancomycin trough level 15-20 mcg/ml  Plan:  No change to Vancomycin dose at this time.  Please address length of therapy.  Consider discontinuing 2/14 with completion of Rocephin course as well.  [employer], Pharm.D., BCPS Clinical Pharmacist Pager [phone] 02/23/2012 9:31 AM

## 2012-02-23 NOTE — Progress Notes (Signed)
Brief Nutrition Note  Malnutrition Screening Tool result is inaccurate.  Please consult if nutrition needs are identified.  Maureen Chatters, RD, LDN Pager #: [phone] After-Hours Pager #: [phone]

## 2012-02-23 NOTE — Progress Notes (Addendum)
8 Days Post-Op Procedure(s) (LRB): TRICUSPID VALVE REPAIR (N/A) MAZE (N/A) INTRAOPERATIVE TRANSESOPHAGEAL ECHOCARDIOGRAM (N/A) MITRAL VALVE (MV) REPLACEMENT (N/A) Subjective:  Ms. Evans continues to complain of cough.  She has been unable to cough anything up. +BM  Objective: Vital signs in last 24 hours: Temp:  [97.6 F (36.4 C)-99.6 F (37.6 C)] 97.9 F (36.6 C) (02/13 0621) Pulse Rate:  [84-97] 93 (02/13 0621) Cardiac Rhythm:  [-] Junctional rhythm (02/12 2045) Resp:  [18-19] 18 (02/13 0621) BP: (110-137)/(67-86) 132/71 mmHg (02/13 0621) SpO2:  [92 %-100 %] 100 % (02/13 0621) Weight:  [227 lb 11.8 oz (103.3 kg)] 227 lb 11.8 oz (103.3 kg) (02/13 0619)  Intake/Output from previous day: 02/12 0701 - 02/13 0700 In: 120 [P.O.:120] Out: 200 [Urine:200]  General appearance: alert, cooperative and no distress Heart: regular rate and rhythm Lungs: clear to auscultation bilaterally Abdomen: soft, non-tender; bowel sounds normal; no masses,  no organomegaly Wound: clean and dry  Lab Results:  Recent Labs  02/21/12 0453 02/22/12 0540  WBC 9.3 10.3  HGB 8.1* 7.9*  HCT 25.4* 24.6*  PLT 177 211   BMET:  Recent Labs  02/21/12 0453 02/22/12 0540  NA 140 138  K 3.7 3.8  CL 101 102  CO2 33* 32  GLUCOSE 86 101*  BUN 11 11  CREATININE 0.53 0.58  CALCIUM 8.8 8.9    PT/INR:  Recent Labs  02/22/12 0540  LABPROT 34.6*  INR 3.71*   ABG    Component Value Date/Time   PHART 7.338* 02/16/2012 0432   HCO3 23.0 02/16/2012 0432   TCO2 24 02/16/2012 1704   ACIDBASEDEF 2.0 02/16/2012 0432   O2SAT 90.0 02/16/2012 0432   CBG (last 3)   Recent Labs  02/22/12 1711 02/22/12 2154 02/23/12 0616  GLUCAP 86 94 91    Assessment/Plan: S/P Procedure(s) (LRB): TRICUSPID VALVE REPAIR (N/A) MAZE (N/A) INTRAOPERATIVE TRANSESOPHAGEAL ECHOCARDIOGRAM (N/A) MITRAL VALVE (MV) REPLACEMENT (N/A)  1. CV- Episodes of Atrial Fibrillation, currently NSR on Amiodaorone, Lopressor, Avapro 2.  Pulm- + Candida in sputum, continue Diflucan, IV ABX, will order Mucinex for cough 3. INR pending- patient did not receive dose of Coumadin last night, will order based on INR results 4. DM- CBGs well controlled 5. Volume Overload- weight stable, + LE edema, continue Lasix 6. Dispo- if INR has decreased will remove EPW today, continue current care   LOS: 8 days    BARRETT, ERIN 02/23/2012  I have seen and examined the patient and agree with the assessment and plan as outlined.  INR 2.6.  Will restart coumadin and plan to d/c pacing wires tomorrow after checking INR.  D/C antibiotics after 7 day course (tomorrow except for Diflucan)  OWEN,CLARENCE H 02/23/2012 10:53 AM

## 2012-02-23 NOTE — Progress Notes (Signed)
CARDIAC REHAB PHASE I   PRE:  Rate/Rhythm: 91 afib    BP: sitting 120/80    SaO2: 92  RA  MODE:  Ambulation: 450 ft   POST:  Rate/Rhythm: 97 afib    BP: sitting 130/80     SaO2: 85 RA -->92 RA  Steady with RW. Continues with rattling cough. Pt seems reluctant to cough until production. SaO2 low after walk, increased to 92 with rest. Encouraged productive cough, IS, flutter.  [id number]  Harriet Masson CES, ACSM

## 2012-02-23 NOTE — Progress Notes (Signed)
Pt ambulated in hallway 650 ft with no SOB or other complaints. Pt back in chair resting. Will continue to monitor.

## 2012-02-24 ENCOUNTER — Inpatient Hospital Stay (HOSPITAL_COMMUNITY): Payer: BC Managed Care – PPO

## 2012-02-24 LAB — CBC
HCT: 25.9 % — ABNORMAL LOW (ref 36.0–46.0)
Hemoglobin: 8.1 g/dL — ABNORMAL LOW (ref 12.0–15.0)
MCH: 29.7 pg (ref 26.0–34.0)
MCV: 94.9 fL (ref 78.0–100.0)
Platelets: 271 10*3/uL (ref 150–400)
RBC: 2.73 MIL/uL — ABNORMAL LOW (ref 3.87–5.11)
WBC: 9.5 10*3/uL (ref 4.0–10.5)

## 2012-02-24 LAB — GLUCOSE, CAPILLARY
Glucose-Capillary: 69 mg/dL — ABNORMAL LOW (ref 70–99)
Glucose-Capillary: 111 mg/dL — ABNORMAL HIGH (ref 70–99)
Glucose-Capillary: 111 mg/dL — ABNORMAL HIGH (ref 70–99)
Glucose-Capillary: 84 mg/dL (ref 70–99)

## 2012-02-24 LAB — BASIC METABOLIC PANEL
BUN: 10 mg/dL (ref 6–23)
CO2: 30 mEq/L (ref 19–32)
Calcium: 8.9 mg/dL (ref 8.4–10.5)
Chloride: 98 mEq/L (ref 96–112)
Creatinine, Ser: 0.73 mg/dL (ref 0.50–1.10)
Glucose, Bld: 72 mg/dL (ref 70–99)

## 2012-02-24 LAB — PRO B NATRIURETIC PEPTIDE: Pro B Natriuretic peptide (BNP): 1163 pg/mL — ABNORMAL HIGH (ref 0–125)

## 2012-02-24 MED ORDER — POTASSIUM CHLORIDE CRYS ER 20 MEQ PO TBCR
20.0000 meq | EXTENDED_RELEASE_TABLET | Freq: Two times a day (BID) | ORAL | Status: DC
  Administered 2012-02-24 – 2012-02-25 (×2): 20 meq via ORAL
  Filled 2012-02-24 (×3): qty 1

## 2012-02-24 MED ORDER — METOPROLOL TARTRATE 25 MG PO TABS: 25.0000 mg | ORAL_TABLET | Freq: Two times a day (BID) | ORAL | Status: DC

## 2012-02-24 MED ORDER — PNEUMOCOCCAL VAC POLYVALENT 25 MCG/0.5ML IJ INJ
0.5000 mL | INJECTION | INTRAMUSCULAR | Status: DC
  Filled 2012-02-24: qty 0.5

## 2012-02-24 MED ORDER — DEXTROMETHORPHAN POLISTIREX 30 MG/5ML PO LQCR: 30.0000 mg | Freq: Four times a day (QID) | ORAL | Status: DC | PRN

## 2012-02-24 MED ORDER — AMIODARONE HCL 200 MG PO TABS
200.0000 mg | ORAL_TABLET | Freq: Two times a day (BID) | ORAL | Status: DC
  Administered 2012-02-24 – 2012-02-25 (×3): 200 mg via ORAL
  Filled 2012-02-24 (×4): qty 1

## 2012-02-24 MED ORDER — OXYCODONE HCL 5 MG PO TABS: 5.0000 mg | ORAL_TABLET | ORAL | Status: DC | PRN

## 2012-02-24 MED ORDER — AMIODARONE HCL 200 MG PO TABS: ORAL_TABLET | ORAL | Status: DC

## 2012-02-24 MED ORDER — POTASSIUM CHLORIDE CRYS ER 20 MEQ PO TBCR
40.0000 meq | EXTENDED_RELEASE_TABLET | Freq: Once | ORAL | Status: AC
  Administered 2012-02-24: 40 meq via ORAL

## 2012-02-24 MED ORDER — WARFARIN SODIUM 4 MG PO TABS: 4.0000 mg | ORAL_TABLET | Freq: Every day | ORAL | Status: DC

## 2012-02-24 NOTE — Progress Notes (Signed)
CARDIAC REHAB PHASE I   PRE:  Rate/Rhythm: 91 afib    BP: sitting 120/70    SaO2: 96 RA left hand  MODE:  Ambulation: 550 ft   POST:  Rate/Rhythm: 106    BP: sitting 126/70     SaO2: 96 RA  Steady with RW. No c/o. At times SaO2 seemed low, in 80s however feel it was inaccurate. Mostly mid to high 90s. Would like RW for home. Ed completed and requests her [facility] be sent to [facility]. Will send referral however it seems pt will need encouragement to do program. [id number]  Harriet Masson CES, ACSM

## 2012-02-24 NOTE — Progress Notes (Signed)
Mistaken entry

## 2012-02-24 NOTE — Progress Notes (Addendum)
HYPOGLYCEMIC NOTE:  CBG: 67--- Administered orange juice Re-check CBG: 69--- Crackers given and breakfast was arriving at time of re-check  Will pass on to dayshift to re-check CBG again.  Arva Chafe

## 2012-02-24 NOTE — Progress Notes (Signed)
EPW removed per protocol pt tolerated well, wire ends intact. 122/70  Heart rate 87, O2 sat 93% 2liters. Instructed bedrest for 1 hour, routine vital began. Will monitor closely. Egbert Garibaldi rn

## 2012-02-24 NOTE — Progress Notes (Addendum)
9 Days Post-Op Procedure(s) (LRB): TRICUSPID VALVE REPAIR (N/A) MAZE (N/A) INTRAOPERATIVE TRANSESOPHAGEAL ECHOCARDIOGRAM (N/A) MITRAL VALVE (MV) REPLACEMENT (N/A) Subjective:  [name] has no new complaints.  Continues to have a cough  Objective: Vital signs in last 24 hours: Temp:  [98.3 F (36.8 C)-98.7 F (37.1 C)] 98.6 F (37 C) (02/14 0435) Pulse Rate:  [93-101] 97 (02/14 0435) Cardiac Rhythm:  [-] Atrial fibrillation (02/13 2015) Resp:  [14-18] 15 (02/14 0435) BP: (95-144)/(69-83) 123/69 mmHg (02/14 0435) SpO2:  [94 %-96 %] 96 % (02/14 0435) Weight:  [227 lb 4.7 oz (103.1 kg)] 227 lb 4.7 oz (103.1 kg) (02/14 0435)  Intake/Output from previous day: 02/13 0701 - 02/14 0700 In: 480 [P.O.:480] Out: -   General appearance: alert, cooperative and no distress Heart: regular rate and rhythm Lungs: clear to auscultation bilaterally Abdomen: soft, non-tender; bowel sounds normal; no masses,  no organomegaly Extremities: edema trace Wound: clean and dr  Lab Results:  Recent Labs  02/22/12 0540 02/24/12 0600  WBC 10.3 9.5  HGB 7.9* 8.1*  HCT 24.6* 25.9*  PLT 211 271   BMET:  Recent Labs  02/22/12 0540 02/24/12 0600  NA 138 137  K 3.8 3.8  CL 102 98  CO2 32 30  GLUCOSE 101* 72  BUN 11 10  CREATININE 0.58 0.73  CALCIUM 8.9 8.9    PT/INR:  Recent Labs  02/24/12 0600  LABPROT 25.7*  INR 2.48*   ABG    Component Value Date/Time   PHART 7.338* 02/16/2012 0432   HCO3 23.0 02/16/2012 0432   TCO2 24 02/16/2012 1704   ACIDBASEDEF 2.0 02/16/2012 0432   O2SAT 90.0 02/16/2012 0432   CBG (last 3)   Recent Labs  02/23/12 1126 02/23/12 1620 02/23/12 2142  GLUCAP 102* 93 90    Assessment/Plan: S/P Procedure(s) (LRB): TRICUSPID VALVE REPAIR (N/A) MAZE (N/A) INTRAOPERATIVE TRANSESOPHAGEAL ECHOCARDIOGRAM (N/A) MITRAL VALVE (MV) REPLACEMENT (N/A)  1. CV- Previous A.Fib, currently NSR on Amiodarone, Lopressor, Avapro 2. Pulm-+ candida in sputum, will continue  Diflucan, pulm toilet 3. INR 2.48- will continue 4 mg tonight 4. DM- CBGs controlled 5. Volume Overload- weight stable, continue diuresis 6. Dispo- will d/c pacing wires today, will d/c Vanc and Rocephin- patient doing well, likely d/c in AM  LOS: 9 days    BARRETT, ERIN 02/24/2012  I have seen and examined the patient and agree with the assessment and plan as outlined.  Tentatively for d/c home in am.  Purcell Nails 02/24/2012 8:18 AM

## 2012-02-24 NOTE — Discharge Summary (Signed)
Physician Discharge Summary  Patient ID: [name] MRN: [id number] DOB/AGE: 04/21/1955 57 y.o.  Admit date: 02/15/2012 Discharge date: 02/24/2012  Admission Diagnoses:  Patient Active Problem List  Diagnosis  . Hypertension  . Diabetes mellitus  . Hypercholesterolemia  . Mediastinal lymphadenopathy  . Asthma  . Atrial fibrillation  . Acute CHF (congestive heart failure)  . Snoring  . Acute on chronic diastolic CHF (congestive heart failure)  . Mitral regurgitation  . Tricuspid regurgitation  . Rheumatic fever  . Obesity (BMI 30-39.9)  . CHF (congestive heart failure)  . MR (mitral regurgitation)   Discharge Diagnoses:   Patient Active Problem List  Diagnosis  . Hypertension  . Diabetes mellitus  . Hypercholesterolemia  . Mediastinal lymphadenopathy  . Asthma  . Atrial fibrillation  . Acute CHF (congestive heart failure)  . Snoring  . Acute on chronic diastolic CHF (congestive heart failure)  . Mitral regurgitation  . Tricuspid regurgitation  . Rheumatic fever  . Obesity (BMI 30-39.9)  . CHF (congestive heart failure)  . MR (mitral regurgitation)  . S/P mitral valve replacement with metallic valve  . S/P tricuspid valve repair  . S/P Maze operation for atrial fibrillation   Discharged Condition: good  History of Present Illness:   [name] is a 57 yo African American female with long standing history of heart murmur, believed to be related to rheumatic fever as a child.  The patient has been routinely followed by Dr. Eldridge Dace due to chronic diastolic CHF, Mitral Regurgitation and persistent Atrial Fibrillation which developed in April 2012 and has been resistant to cardioversion attempts.  The patients states that she has noticed a gradual decline in her daily activity over the past 10 years.  She states that she has noticed a gradual progression of exertional shortness of breath and fatigue.  However over the past year the patient states that her 2 symptoms have  gotten worse.  These symptoms prompted hospitalization late last year with an acute CHF exacerbation and acute pulmonary edema.  During that time patient underwent Echocardiography which showed severe mitral regurgitation.  The patient was treated medically and later underwent cardiac catheterization which revealed normal coronaries but moderate pulmonary hypertension was present.  Due to these findings and the progression of her symptoms she was referred to [facility] for possible surgical intervention.  She was initially evaluated by Dr. Cornelius Moras on 01/16/2012 at which time the patient continued to complain of her chronic fatigue and shortness of breath symptoms.  Her imaging was reviewed by Dr. Cornelius Moras who felt the patient would benefit from surgical intervention.  The risks and benefits of valve repair vs replacement were explained to the patient and she was agreeable to proceed with surgery.  However, she wished to wait several weeks to make arrangements at home.  Her surgery was tentatively scheduled for Friday 02/15/2012 and would consist of Tricuspid Valve Repair, Mitral Valve repair, possible replacement and MAZE procedure.    Hospital Course:   [name] presented to [facility] on February 15, 2012.  She was taken to the operating room and underwent Mitral Valve Replacement utilizing a 31 mm Sorin Carbomedics Optiform mechanical prosthesis, Tricuspid Valve Repair utilizing a 28 mm mc3 ring annuloplasty, and MAZE procedure with complete bi-atrial lesion set with bipolar radiofrequency and cryothermy ablation with clipping of left atrial appendage.  The patient tolerated the procedure well and was taken to the SICU in stable condition.  The patient was extubated early morning after surgery.  The patient developed low grade fever overnight and cough with sputum production.  Sputum culture was sent and patient was placed on empiric antibiotics.  During her ICU stay she was weaned off all cardiac drips.  Her chest  tubes and arterial lines were removed without difficulty.  She was started on Coumadin for her mechanical Mitral Valve Replacement.  Patient had problems with CHF post operatively and she was aggressively diuresed with Lasix drip.  She was transitioned off insulin drip with good blood sugar control with home insulin and oral medication regimen.  Patient developed bradycardia requiring atrial pacing.  She later developed Atrial Fibrillation and was treated with Amiodarone and Lopressor.  Once patient was medically stable she was transferred to the step down unit in stable condition.  Her sputum culture was positive for candida and she was placed on Diflucan.  The patient is doing well.  She has continued to have episodes of Atrial Fibrillation.  Her INR was supra-therapeutic and her coumadin dose was decreased.  Her pacing wires were removed without difficulty.  The patient has completed a 7 day course of IV Vancomycin and Rocephin for Bronchitis coverage and will complete a course of Diflucan prior to discharge.  She is medically stable at this time.  Her INR is therapeutic at 2.46.  She is ambulating and tolerating her diet without difficulty.  Should no further issues arise we will plan for discharge home in the next 24-48 hours.  She will follow up with Dr. Cornelius Moras in 3 weeks with a chest xray prior to her appointment.  She will need to have a PT/INR checked on Monday at Dr. Hoyle Barr office.  She will also need to set up a 2-4 week follow up visit.                Significant Diagnostic Studies:   Angiography:   HEMODYNAMICS: Aortic pressure was 117/74; LV pressure was 115/17; LVEDP 26. There was no gradient between the left ventricle and aorta. RA 30/30, RV 60/17, RVEDP 26 mm Hg; PA pressure 56/25, PCWP 35/38, mPCWP 28 mm Hg. PA sat 43%.  ANGIOGRAPHIC DATA: The left main coronary artery is angiographically normal.  The left anterior descending artery is angiographically normal. There is a medium sized  diagonal which is widely patent.  The left circumflex artery is angiographically normal. There is a large ramus vessel which is widely patent. Large OM1 is angiographically normal.  The right coronary artery is a large dominant vessel which is angiographically normal.  LEFT VENTRICULOGRAM: Left ventricular angiogram was done in the 30 RAO projection and revealed normal left ventricular wall motion and systolic function with an estimated ejection fraction of 60%. LVEDP was 26 mmHg. There is 3+ mitral regurgitation.   ABDOMINAL AORTOGRAM: No AAA. No renal artery stenosis. Bilateral single renal arteries appear widely patent.  ECHOCARDIOGRAM:   - Left ventricle: Systolic function was normal. The estimated ejection fraction was in the range of 55% to 60%. - Aortic valve: Mild regurgitation. - Mitral valve: Moderate prolapse, involving the anterior leaflet. Severe regurgitation. - Left atrium: The atrium was mildly to moderately dilated. No evidence of thrombus in the atrial cavity or appendage. - Right atrium: The atrium was dilated. - Tricuspid valve: Moderate regurgitation. Transesophageal echocardiography. 2D and color Doppler. Patient status: Inpatient. Location: Endoscopy.  Treatments: surgery:   Procedure:  Mitral Valve Replacement Sorin Carbomedics Optiform mechanical prosthesis (size 31mm, catalog #[id number], serial #[id number])  Tricuspid Valve Repair Edwards mc3 ring annuloplasty (size 28mm, model #  [id number], serial [id number])  Maze Procedure  complete biatrial lesion set using bipolar radiofrequency and cryothermy ablation with clipping of left atrial appendage   Disposition: 01-Home or Self Care  The patient has been discharged on:   1.Beta Blocker:  Yes [  x ]                              No   [   ]                              If No, reason:  2.Ace Inhibitor/ARB: Yes [x   ]                                     No  [    ]                                     If No,  reason:  3.Statin:   Yes [  x ]                  No  [   ]                  If No, reason:  4.Ecasa:  Yes  [   ]                  No   [ x  ]                  If No, reason: On Coumadin, no CAD     Medication List    STOP taking these medications       diltiazem 360 MG 24 hr capsule  Commonly known as:  TIAZAC     Nebivolol HCl 20 MG Tabs     Rivaroxaban 20 MG Tabs  Commonly known as:  XARELTO      TAKE these medications       albuterol 108 (90 BASE) MCG/ACT inhaler  Commonly known as:  PROVENTIL HFA;VENTOLIN HFA  Inhale 2 puffs into the lungs every 6 (six) hours as needed.     amiodarone 200 MG tablet  Commonly known as:  PACERONE  Take 200 mg by mouth twice daily for 7 days, Then decrease to 200 mg once daily     calcium carbonate 1250 MG tablet  Commonly known as:  OS-CAL - dosed in mg of elemental calcium  Take 1 tablet by mouth daily.     dextromethorphan 30 MG/5ML liquid  Commonly known as:  DELSYM  Take 5 mLs (30 mg total) by mouth every 6 (six) hours as needed for cough.     DIOVAN 320 MG tablet  Generic drug:  valsartan  Take 320 mg by mouth daily. Once daily     Fish Oil 1000 MG Caps  Take 1 capsule by mouth daily.     furosemide 40 MG tablet  Commonly known as:  LASIX  Take 1 tablet (40 mg total) by mouth 2 (two) times daily.     LANTUS SOLOSTAR 100 UNIT/ML injection  Generic drug:  insulin glargine  Inject 30-35 Units into the skin See admin instructions. Inject 30 units once daily and inject 35  units daily at bedtime.     LIPITOR 40 MG tablet  Generic drug:  atorvastatin  Take 40 mg by mouth every evening. Once daily     metoprolol tartrate 25 MG tablet  Commonly known as:  LOPRESSOR  Take 1 tablet (25 mg total) by mouth 2 (two) times daily.     oxyCODONE 5 MG immediate release tablet  Commonly known as:  Oxy IR/ROXICODONE  Take 1-2 tablets (5-10 mg total) by mouth every 4 (four) hours as needed.     potassium chloride SA 20 MEQ  tablet  Commonly known as:  K-DUR,KLOR-CON  Take 1 tablet (20 mEq total) by mouth daily.     sitaGLIPtan-metformin 50-1000 MG per tablet  Commonly known as:  JANUMET  Take 1 tablet by mouth daily.     Vitamin D 1000 UNITS capsule  Take 1,000 Units by mouth daily.     warfarin 4 MG tablet  Commonly known as:  COUMADIN  Take 1 tablet (4 mg total) by mouth daily at 6 PM.           Follow-up Information   Follow up with Purcell Nails, MD In 3 weeks. (Office will contact you with appointment)    Contact information:   [address] [employer] [place] [address] [phone]       Follow up with [facility] In 3 weeks. (Please get CXR one hour prior to your appointment with Dr. Cornelius Moras)    Contact information:   [place]       Follow up with Corky Crafts., MD. (Please contact office to set up PT/INR draw for Monday 02/27/2012)    Contact information:   [address] [place] [address] [phone]       Schedule an appointment as soon as possible for a visit with Corky Crafts., MD. (Contact office to set up a 2-4 week follow up appointment)    Contact information:   [address] [place] [address] [phone]       Signed: Lowella Dandy 02/24/2012, 8:33 AM

## 2012-02-25 LAB — PROTIME-INR: Prothrombin Time: 25.3 seconds — ABNORMAL HIGH (ref 11.6–15.2)

## 2012-02-25 NOTE — Progress Notes (Signed)
[address]            [place] [address]          [phone]    10 Days Post-Op  Procedure(s) (LRB): TRICUSPID VALVE REPAIR (N/A) MAZE (N/A) INTRAOPERATIVE TRANSESOPHAGEAL ECHOCARDIOGRAM (N/A) MITRAL VALVE (MV) REPLACEMENT (N/A) Subjective: Feels ok, cough somewhat improved   Objective  Telemetry afib with controlled rate  Temp:  [98.5 F (36.9 C)] 98.5 F (36.9 C) (02/15 0645) Pulse Rate:  [82-99] 82 (02/15 0645) Resp:  [18] 18 (02/15 0645) BP: (99-140)/(50-80) 140/77 mmHg (02/15 0645) SpO2:  [93 %-95 %] 95 % (02/15 0645)   Intake/Output Summary (Last 24 hours) at 02/25/12 0735 Last data filed at 02/24/12 1652  Gross per 24 hour  Intake    480 ml  Output      0 ml  Net    480 ml       General appearance: alert, cooperative and no distress Heart: regular rate and rhythm Lungs: clear to auscultation bilaterally Abdomen: benign Extremities: + edema Wound: incisions healing well  Lab Results:  Recent Labs  02/24/12 0600  NA 137  K 3.8  CL 98  CO2 30  GLUCOSE 72  BUN 10  CREATININE 0.73  CALCIUM 8.9   No results found for this basename: AST, ALT, ALKPHOS, BILITOT, PROT, ALBUMIN,  in the last 72 hours No results found for this basename: LIPASE, AMYLASE,  in the last 72 hours  Recent Labs  02/24/12 0600  WBC 9.5  HGB 8.1*  HCT 25.9*  MCV 94.9  PLT 271   No results found for this basename: CKTOTAL, CKMB, TROPONINI,  in the last 72 hours No components found with this basename: POCBNP,  No results found for this basename: DDIMER,  in the last 72 hours No results found for this basename: HGBA1C,  in the last 72 hours No results found for this basename: CHOL, HDL, LDLCALC, TRIG, CHOLHDL,  in the last 72 hours No results found for this basename: TSH, T4TOTAL, FREET3, T3FREE, THYROIDAB,  in the last 72 hours No results found for this basename: VITAMINB12, FOLATE, FERRITIN, TIBC, IRON, RETICCTPCT,  in the last 72  hours  Medications: Scheduled . amiodarone  200 mg Oral BID  . atorvastatin  40 mg Oral QPM  . bisacodyl  10 mg Oral Daily   Or  . bisacodyl  10 mg Rectal Daily  . docusate sodium  200 mg Oral Daily  . fluconazole  100 mg Oral Daily  . furosemide  40 mg Oral BID  . guaiFENesin  1,200 mg Oral BID  . insulin aspart  0-24 Units Subcutaneous TID AC & HS  . insulin detemir  20 Units Subcutaneous BID  . irbesartan  150 mg Oral Daily  . linagliptin  5 mg Oral Q breakfast   And  . metFORMIN  1,000 mg Oral Q breakfast  . metoprolol tartrate  25 mg Oral BID  . pantoprazole  40 mg Oral Daily  . pneumococcal 23 valent vaccine  0.5 mL Intramuscular Tomorrow-1000  . potassium chloride  20 mEq Oral BID  . sodium chloride  3 mL Intravenous Q12H  . warfarin  4 mg Oral q1800  . Warfarin - Physician Dosing Inpatient   Does not apply q1800     Radiology/Studies:  Dg Chest 2 View  02/24/2012  *RADIOLOGY REPORT*  Clinical Data: History of  atelectasis and congestive heart failure. Follow-up.  CHEST - 2 VIEW  Comparison: 02/22/2012.  Findings: Tip of right PICC terminates in the superior vena cava. No pneumothorax is evident.  Cardiac silhouette is slightly enlarged and appears stable.  Previous median sternotomy and valvular replacement have been performed.  Atrial appendage clip is unchanged.  Mild vascular congestion pattern is seen with upper lobe vascular prominence.  There is minimal basilar atelectasis. No consolidation or pleural effusion is evident.  No skeletal lesion is evident.  IMPRESSION: PICC in place.  Postoperative changes are described above.  Minimal basilar atelectasis.  Mild vascular congestion pattern with upper lobe vascular prominence.  No alveolar pulmonary edema or pleural effusion is evident.  No new abnormality is evident.   Original Report Authenticated By: Onalee Hua Call     INR:2.43 Will add last result for INR, ABG once components are confirmed Will add last 4 CBG results once  components are confirmed  Assessment/Plan: S/P Procedure(s) (LRB): TRICUSPID VALVE REPAIR (N/A) MAZE (N/A) INTRAOPERATIVE TRANSESOPHAGEAL ECHOCARDIOGRAM (N/A) MITRAL VALVE (MV) REPLACEMENT (N/A)  1. Doing well, stable for D/C      LOS: 10 days    Weston Fulco E 2/15/20147:35 AM

## 2012-02-25 NOTE — Progress Notes (Signed)
CARDIAC REHAB PHASE I   PRE:  Rate/Rhythm: 92 AFIB  BP:  Supine:   Sitting: 118/60  Standing:    SaO2: 94 % RA  MODE:  Ambulation: 550 ft   POST:  Rate/Rhythem: 104 AFIB  BP:  Supine:   Sitting: 124/66  Standing:    SaO2: 96 % RA  Patient tolerated well with no compliants. Ambulated independently with rolling walker with staff beside her. Back to bedside with call bell in reach.  [phone]  Adiba Fargnoli, Toni Amend

## 2012-02-25 NOTE — Progress Notes (Signed)
Chest tube sutures d/c at this time per MD order; steri strips applied; no drainage noted; pt tolerated well; pt to d/c home today with husband; will cont. To monitor.

## 2012-03-14 ENCOUNTER — Other Ambulatory Visit: Payer: Self-pay | Admitting: *Deleted

## 2012-03-14 DIAGNOSIS — I059 Rheumatic mitral valve disease, unspecified: Secondary | ICD-10-CM

## 2012-03-19 ENCOUNTER — Ambulatory Visit (INDEPENDENT_AMBULATORY_CARE_PROVIDER_SITE_OTHER): Payer: Self-pay | Admitting: Thoracic Surgery (Cardiothoracic Vascular Surgery)

## 2012-03-19 ENCOUNTER — Encounter: Payer: Self-pay | Admitting: Thoracic Surgery (Cardiothoracic Vascular Surgery)

## 2012-03-19 ENCOUNTER — Ambulatory Visit
Admission: RE | Admit: 2012-03-19 | Discharge: 2012-03-19 | Disposition: A | Discharge status: Home / Self Care | Payer: BC Managed Care – PPO | Source: Ambulatory Visit | Attending: Thoracic Surgery (Cardiothoracic Vascular Surgery) | Admitting: Thoracic Surgery (Cardiothoracic Vascular Surgery)

## 2012-03-19 VITALS — BP 136/90 | HR 100 | Resp 20 | Ht 66.0 in | Wt 211.0 lb

## 2012-03-19 DIAGNOSIS — Z954 Presence of other heart-valve replacement: Secondary | ICD-10-CM

## 2012-03-19 DIAGNOSIS — Z952 Presence of prosthetic heart valve: Secondary | ICD-10-CM | POA: Insufficient documentation

## 2012-03-19 DIAGNOSIS — I059 Rheumatic mitral valve disease, unspecified: Secondary | ICD-10-CM

## 2012-03-19 DIAGNOSIS — I079 Rheumatic tricuspid valve disease, unspecified: Secondary | ICD-10-CM

## 2012-03-19 DIAGNOSIS — Z9889 Other specified postprocedural states: Secondary | ICD-10-CM

## 2012-03-19 NOTE — Progress Notes (Signed)
[address]            [name] [address]          [phone]     CARDIOTHORACIC SURGERY OFFICE NOTE  Referring Carlota Philley is Corky Crafts., *MD PCP is Hulda Humphrey, NP   HPI:  Patient returns for followup status post mitral valve replacement using a mechanical prosthesis, tricuspid valve repair, and Maze procedure on 02/15/2012. Her early postoperative recovery in the hospital was somewhat slow notable for the development of productive cough, possible tracheobronchitis, and atrial fibrillation. Since hospital discharge she has done remarkably well. She has had her prothrombin time checked and Coumadin dose adjusted through the [facility] Cardiology office. She returns for office for routine followup today and reports that she is getting along very well. She states that her breathing is much better than it had been for months prior to her surgery and she is just a lighted without good she feels at this point. She still has mild soreness across her chest but she is using pain relievers primarily just to help her sleep at night. She denies any tachypalpitations or dizzy spells. Her lower extremity edema has resolved. She is sleeping well. Her appetite is good. Overall she reports feeling quite good and much better than she did prior to surgery.   Current Outpatient Prescriptions  Medication Sig Dispense Refill  . albuterol (PROVENTIL HFA;VENTOLIN HFA) 108 (90 BASE) MCG/ACT inhaler Inhale 2 puffs into the lungs every 6 (six) hours as needed.      [name] amiodarone (PACERONE) 200 MG tablet Take 200 mg by mouth twice daily for 7 days, Then decrease to 200 mg once daily  60 tablet  1  . calcium carbonate (OS-CAL - DOSED IN MG OF ELEMENTAL CALCIUM) 1250 MG tablet Take 1 tablet by mouth daily.        . Cholecalciferol (VITAMIN D) 1000 UNITS capsule Take 1,000 Units by mouth daily.       [name] dextromethorphan (DELSYM) 30 MG/5ML liquid Take 5 mLs (30 mg total) by  mouth every 6 (six) hours as needed for cough.  89 mL    . DIOVAN 320 MG tablet Take 320 mg by mouth daily. Once daily      . furosemide (LASIX) 40 MG tablet Take 1 tablet (40 mg total) by mouth 2 (two) times daily.  60 tablet  11  . insulin glargine (LANTUS SOLOSTAR) 100 UNIT/ML injection Inject 30-35 Units into the skin See admin instructions. Inject 30 units once daily and inject 35 units daily at bedtime.      [name] LIPITOR 40 MG tablet Take 40 mg by mouth every evening. Once daily      . metoprolol tartrate (LOPRESSOR) 25 MG tablet Take 1 tablet (25 mg total) by mouth 2 (two) times daily.  60 tablet  1  . Omega-3 Fatty Acids (FISH OIL) 1000 MG CAPS Take 1 capsule by mouth daily.       [name] oxyCODONE (OXY IR/ROXICODONE) 5 MG immediate release tablet Take 1-2 tablets (5-10 mg total) by mouth every 4 (four) hours as needed.  50 tablet  0  . potassium chloride SA (K-DUR,KLOR-CON) 20 MEQ tablet Take 1 tablet (20 mEq total) by mouth daily.  30 tablet  11  . sitaGLIPtan-metformin (JANUMET) 50-1000 MG per tablet Take 1 tablet by mouth daily.      [name] warfarin (COUMADIN) 4  MG tablet Take 1 tablet (4 mg total) by mouth daily at 6 PM.  30 tablet  1  . [DISCONTINUED] flecainide (TAMBOCOR) 100 MG tablet Take 1 tablet (100 mg total) by mouth every 12 (twelve) hours.  60 tablet  11   No current facility-administered medications for this visit.   Facility-Administered Medications Ordered in Other Visits  Medication Dose Route Frequency Truly Stankiewicz Last Rate Last Dose  . glutaraldehyde 0.625% soaking solution   Topical Once Purcell Nails, MD          Physical Exam:   BP 136/90  Pulse 100  Resp 20  Ht 5\' 6"  (1.676 m)  Wt 211 lb (95.709 kg)  BMI 34.07 kg/m2  SpO2 96%  General:  Well-appearing  Chest:   Clear to auscultation with symmetrical breath sounds  CV:   Regular rate and rhythm with mechanical heart sounds, no murmur appreciated  Incisions:  Clean and dry and healing nicely, sternum is  stable  Abdomen:  Soft and nontender  Extremities:  Warm and well-perfused with no lower extremity edema  Diagnostic Tests:  2 channel telemetry rhythm strip demonstrates what appears to be sinus rhythm   *RADIOLOGY REPORT*  Clinical Data: History of heart surgery.  CHEST - 2 VIEW  Comparison: 02/24/2012.  Findings: Trachea is midline. Heart is enlarged, stable. Right  PICC has been removed. Lungs are somewhat low in volume but clear.  No pleural fluid.  IMPRESSION:  Low lung volumes without acute finding.  Original Report Authenticated By: Leanna Battles, M.D.   Impression:  Patient is doing very well just one month status post mitral valve replacement with a mechanical prosthesis, tricuspid valve repair, and Maze procedure.  Plan:  I've encouraged patient to continue to gradually increase her physical activity as tolerated, but I have reminded her to refrain from any sort of heavy lifting or strenuous use of her arms or shoulders for least another 2 months. I've strongly encourage her to enroll in the cardiac rehabilitation program. She will continue to followup with Dr. Eldridge Dace and to have her Coumadin dose monitored and adjusted through the [facility] Cardiology office.  All of her questions been addressed. We will have the patient return in 2-3 months for routine followup and rhythm check. At some point it would be reasonable to check a followup echocardiogram, but we will defer this to Dr. Eldridge Dace.   Salvatore Decent. Cornelius Moras, MD 03/19/2012 4:21 PM

## 2012-03-19 NOTE — Patient Instructions (Addendum)
The patient should continue to avoid any heavy lifting or strenuous use of arms or shoulders for at least a total of three months from the time of surgery.  Discontinue taking amiodarone when your current prescription runs out.    Start cardiac rehab

## 2012-04-14 ENCOUNTER — Inpatient Hospital Stay (HOSPITAL_COMMUNITY)
Admission: EM | Admit: 2012-04-14 | Discharge: 2012-04-19 | DRG: 533 | Disposition: A | Discharge status: Home / Self Care | Payer: BC Managed Care – PPO | Attending: Internal Medicine | Admitting: Internal Medicine

## 2012-04-14 ENCOUNTER — Encounter (HOSPITAL_COMMUNITY): Payer: Self-pay | Admitting: Emergency Medicine

## 2012-04-14 ENCOUNTER — Emergency Department (HOSPITAL_COMMUNITY): Payer: BC Managed Care – PPO

## 2012-04-14 DIAGNOSIS — J45909 Unspecified asthma, uncomplicated: Secondary | ICD-10-CM | POA: Diagnosis present

## 2012-04-14 DIAGNOSIS — E785 Hyperlipidemia, unspecified: Secondary | ICD-10-CM | POA: Diagnosis present

## 2012-04-14 DIAGNOSIS — I4892 Unspecified atrial flutter: Secondary | ICD-10-CM | POA: Diagnosis present

## 2012-04-14 DIAGNOSIS — Z952 Presence of prosthetic heart valve: Secondary | ICD-10-CM

## 2012-04-14 DIAGNOSIS — I509 Heart failure, unspecified: Secondary | ICD-10-CM | POA: Diagnosis present

## 2012-04-14 DIAGNOSIS — I071 Rheumatic tricuspid insufficiency: Secondary | ICD-10-CM

## 2012-04-14 DIAGNOSIS — Y831 Surgical operation with implant of artificial internal device as the cause of abnormal reaction of the patient, or of later complication, without mention of misadventure at the time of the procedure: Secondary | ICD-10-CM | POA: Diagnosis present

## 2012-04-14 DIAGNOSIS — E7800 Pure hypercholesterolemia, unspecified: Secondary | ICD-10-CM

## 2012-04-14 DIAGNOSIS — T82897A Other specified complication of cardiac prosthetic devices, implants and grafts, initial encounter: Secondary | ICD-10-CM | POA: Diagnosis present

## 2012-04-14 DIAGNOSIS — I6350 Cerebral infarction due to unspecified occlusion or stenosis of unspecified cerebral artery: Secondary | ICD-10-CM

## 2012-04-14 DIAGNOSIS — Y92009 Unspecified place in unspecified non-institutional (private) residence as the place of occurrence of the external cause: Secondary | ICD-10-CM

## 2012-04-14 DIAGNOSIS — I10 Essential (primary) hypertension: Secondary | ICD-10-CM | POA: Diagnosis present

## 2012-04-14 DIAGNOSIS — I340 Nonrheumatic mitral (valve) insufficiency: Secondary | ICD-10-CM

## 2012-04-14 DIAGNOSIS — T45515A Adverse effect of anticoagulants, initial encounter: Secondary | ICD-10-CM | POA: Diagnosis present

## 2012-04-14 DIAGNOSIS — Z954 Presence of other heart-valve replacement: Secondary | ICD-10-CM

## 2012-04-14 DIAGNOSIS — R791 Abnormal coagulation profile: Secondary | ICD-10-CM | POA: Diagnosis present

## 2012-04-14 DIAGNOSIS — I099 Rheumatic heart disease, unspecified: Secondary | ICD-10-CM | POA: Diagnosis present

## 2012-04-14 DIAGNOSIS — I428 Other cardiomyopathies: Secondary | ICD-10-CM | POA: Diagnosis present

## 2012-04-14 DIAGNOSIS — I5042 Chronic combined systolic (congestive) and diastolic (congestive) heart failure: Secondary | ICD-10-CM | POA: Diagnosis present

## 2012-04-14 DIAGNOSIS — I5033 Acute on chronic diastolic (congestive) heart failure: Secondary | ICD-10-CM

## 2012-04-14 DIAGNOSIS — R590 Localized enlarged lymph nodes: Secondary | ICD-10-CM

## 2012-04-14 DIAGNOSIS — Z9889 Other specified postprocedural states: Secondary | ICD-10-CM

## 2012-04-14 DIAGNOSIS — E669 Obesity, unspecified: Secondary | ICD-10-CM | POA: Diagnosis present

## 2012-04-14 DIAGNOSIS — E876 Hypokalemia: Secondary | ICD-10-CM | POA: Diagnosis present

## 2012-04-14 DIAGNOSIS — Z8679 Personal history of other diseases of the circulatory system: Secondary | ICD-10-CM

## 2012-04-14 DIAGNOSIS — Z6835 Body mass index (BMI) 35.0-35.9, adult: Secondary | ICD-10-CM

## 2012-04-14 DIAGNOSIS — I4891 Unspecified atrial fibrillation: Secondary | ICD-10-CM | POA: Diagnosis present

## 2012-04-14 DIAGNOSIS — I00 Rheumatic fever without heart involvement: Secondary | ICD-10-CM

## 2012-04-14 DIAGNOSIS — Z7901 Long term (current) use of anticoagulants: Secondary | ICD-10-CM

## 2012-04-14 DIAGNOSIS — R0683 Snoring: Secondary | ICD-10-CM

## 2012-04-14 DIAGNOSIS — I447 Left bundle-branch block, unspecified: Secondary | ICD-10-CM | POA: Diagnosis present

## 2012-04-14 DIAGNOSIS — I6340 Cerebral infarction due to embolism of unspecified cerebral artery: Principal | ICD-10-CM | POA: Diagnosis present

## 2012-04-14 DIAGNOSIS — E119 Type 2 diabetes mellitus without complications: Secondary | ICD-10-CM | POA: Diagnosis present

## 2012-04-14 DIAGNOSIS — Z794 Long term (current) use of insulin: Secondary | ICD-10-CM

## 2012-04-14 DIAGNOSIS — I639 Cerebral infarction, unspecified: Secondary | ICD-10-CM | POA: Diagnosis present

## 2012-04-14 DIAGNOSIS — I051 Rheumatic mitral insufficiency: Secondary | ICD-10-CM | POA: Diagnosis present

## 2012-04-14 DIAGNOSIS — Z79899 Other long term (current) drug therapy: Secondary | ICD-10-CM

## 2012-04-14 HISTORY — DX: Unspecified atrial flutter: I48.92

## 2012-04-14 LAB — COMPREHENSIVE METABOLIC PANEL
Alkaline Phosphatase: 123 U/L — ABNORMAL HIGH (ref 39–117)
BUN: 10 mg/dL (ref 6–23)
Chloride: 102 mEq/L (ref 96–112)
GFR calc Af Amer: >90 mL/min (ref >90)
Glucose, Bld: 147 mg/dL — ABNORMAL HIGH (ref 70–99)
Potassium: 3.2 mEq/L — ABNORMAL LOW (ref 3.5–5.1)
Total Bilirubin: 0.7 mg/dL (ref 0.3–1.2)

## 2012-04-14 LAB — DIFFERENTIAL
Eosinophils Absolute: 0.2 10*3/uL (ref 0.0–0.7)
Lymphs Abs: 1.4 10*3/uL (ref 0.7–4.0)
Monocytes Relative: 11 % (ref 3–12)
Neutro Abs: 3.7 10*3/uL (ref 1.7–7.7)
Neutrophils Relative %: 63 % (ref 43–77)

## 2012-04-14 LAB — CBC
HCT: 34.5 % — ABNORMAL LOW (ref 36.0–46.0)
Hemoglobin: 11.3 g/dL — ABNORMAL LOW (ref 12.0–15.0)
MCH: 28.6 pg (ref 26.0–34.0)
RBC: 3.95 MIL/uL (ref 3.87–5.11)

## 2012-04-14 LAB — POCT I-STAT, CHEM 8
Calcium, Ion: 1.17 mmol/L (ref 1.12–1.23)
HCT: 37 % (ref 36.0–46.0)
TCO2: 25 mmol/L (ref 0–100)

## 2012-04-14 LAB — POCT I-STAT TROPONIN I: Troponin i, poc: 0.04 ng/mL (ref 0.00–0.08)

## 2012-04-14 LAB — PROTIME-INR: INR: 1.84 — ABNORMAL HIGH (ref 0.00–1.49)

## 2012-04-14 LAB — TROPONIN I: Troponin I: <0.3 ng/mL (ref <0.30)

## 2012-04-14 MED ORDER — SENNOSIDES-DOCUSATE SODIUM 8.6-50 MG PO TABS
1.0000 | ORAL_TABLET | Freq: Every evening | ORAL | Status: DC | PRN
  Filled 2012-04-14: qty 1

## 2012-04-14 MED ORDER — ATORVASTATIN CALCIUM 40 MG PO TABS
40.0000 mg | ORAL_TABLET | Freq: Every evening | ORAL | Status: DC
  Filled 2012-04-14 (×2): qty 1

## 2012-04-14 MED ORDER — HYDRALAZINE HCL 20 MG/ML IJ SOLN: 10.0000 mg | Freq: Three times a day (TID) | INTRAMUSCULAR | Status: DC | PRN

## 2012-04-14 MED ORDER — HEPARIN (PORCINE) IN NACL 100-0.45 UNIT/ML-% IJ SOLN
1600.0000 [IU]/h | INTRAMUSCULAR | Status: DC
  Administered 2012-04-14: 1400 [IU]/h via INTRAVENOUS
  Administered 2012-04-14: 1100 [IU]/h via INTRAVENOUS
  Administered 2012-04-15 (×2): 1600 [IU]/h via INTRAVENOUS
  Filled 2012-04-14 (×2): qty 250

## 2012-04-14 MED ORDER — INSULIN ASPART 100 UNIT/ML ~~LOC~~ SOLN
0.0000 [IU] | Freq: Three times a day (TID) | SUBCUTANEOUS | Status: DC
  Administered 2012-04-16 – 2012-04-17 (×3): 1 [IU] via SUBCUTANEOUS
  Administered 2012-04-17 – 2012-04-18 (×2): 2 [IU] via SUBCUTANEOUS
  Administered 2012-04-19: 1 [IU] via SUBCUTANEOUS

## 2012-04-14 MED ORDER — METOPROLOL TARTRATE 25 MG PO TABS
25.0000 mg | ORAL_TABLET | Freq: Two times a day (BID) | ORAL | Status: DC
  Administered 2012-04-15 (×2): 25 mg via ORAL
  Filled 2012-04-14 (×5): qty 1

## 2012-04-14 MED ORDER — SODIUM CHLORIDE 0.9 % IV SOLN
INTRAVENOUS | Status: DC
  Administered 2012-04-14 – 2012-04-15 (×2): via INTRAVENOUS

## 2012-04-14 MED ORDER — METOPROLOL TARTRATE 1 MG/ML IV SOLN
5.0000 mg | Freq: Four times a day (QID) | INTRAVENOUS | Status: DC | PRN
  Filled 2012-04-14: qty 5

## 2012-04-14 MED ORDER — ALBUTEROL SULFATE HFA 108 (90 BASE) MCG/ACT IN AERS
2.0000 | INHALATION_SPRAY | Freq: Four times a day (QID) | RESPIRATORY_TRACT | Status: DC | PRN
  Filled 2012-04-14: qty 6.7

## 2012-04-14 MED ORDER — ASPIRIN 300 MG RE SUPP
300.0000 mg | Freq: Once | RECTAL | Status: AC
  Administered 2012-04-14: 300 mg via RECTAL
  Filled 2012-04-14: qty 1

## 2012-04-14 MED ORDER — POTASSIUM CHLORIDE 10 MEQ/100ML IV SOLN
10.0000 meq | INTRAVENOUS | Status: AC
  Administered 2012-04-14 (×2): 10 meq via INTRAVENOUS
  Filled 2012-04-14 (×2): qty 100

## 2012-04-14 MED ORDER — AMIODARONE HCL 200 MG PO TABS
200.0000 mg | ORAL_TABLET | Freq: Every day | ORAL | Status: DC
  Filled 2012-04-14: qty 1

## 2012-04-14 NOTE — Consult Note (Addendum)
Referring Physician: Dr. Rennis Chris    Chief Complaint:  New-onset speech difficulty and right-sided weakness  HPI: [name] is an 57 y.o. 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 Patient was last known well at 9 AM today. Has no previous history of stroke nor TIA. Patient has been on Coumadin for anticoagulation. INR was 1.84 today. As such she was not deemed a candidate for intravenous thrombolytic therapy with TPA. NIH stroke score was 8.  LSN: 9 AM on 04/14/2012 tPA Given: No: INR 1.84 MRankin: 2  Past Medical History  Diagnosis Date  . Asthma   . Hypertension   . Obesity   . Chronic diastolic congestive heart failure   . Carotid bruit   . Hypercholesterolemia   . Polyp of rectum   . Abnormal chest CT   . Persistent atrial fibrillation   . Bronchospasm 1998  . Atrial enlargement, left     severe  . Heart murmur   . Sleep apnea     DOES NOT HAVE CPAP  . Diabetes mellitus     insulin dependent  . Mitral regurgitation   . Tricuspid regurgitation 01/16/2012  . Rheumatic fever 01/16/2012    Reported during childhood  . Atrial fibrillation 01/05/2011    Chronic persistent, failed DCCV   . Obesity (BMI 30-39.9) 01/16/2012  . Shortness of breath     with exertion  . History of blood transfusion   . 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 S/P Maze operation for atrial fibrillation 02/15/2012    Complete biatrial lesion set using bipolar radiofrequency and cryothermy ablation with clipping of LA appendage    Family History  Problem Relation Age of Onset  . Asthma Mother      Medications: I have reviewed the patient's current  medications. Prior to Admission:  Proventil inhaler 2 puffs every 6 hours as needed Pacerone 200 mg daily Os-Cal one tablet daily Vitamin D 1000 units daily Dextromethorphan 30 mg every 6 hours as needed for cough Diovan 320 mg per day Lasix 40 mg twice a day Lantus 30 units in the a.m. and 35 units at bedtime Lipitor 40 mg per day Lopressor 25 mg twice a day Omega-3 fatty acids 1000 mg daily Oxycodone 5 mg one to 2 tablets every 4 hours as needed for pain Potassium chloride 20 mEq per day Janumet 50-100 one tablet daily Coumadin 4 mg each evening Tambocor 100 mg every 12 hours  Physical Examination: Blood pressure 132/84, pulse 113, temperature 98.5 F (36.9 C), temperature source Oral, resp. rate 25, SpO2 99.00%.  Neurologic Examination: Mental Status: Alert, moderately severe expressive aphasia; no receptive aphasia. Able to follow commands without difficulty.  Cranial Nerves: II-Visual fields were normal. III/IV/VI-Pupils were equal and reacted. Extraocular movements were full and conjugate.    V/VII-no facial numbness; moderate right lower facial weakness facial weakness. VIII-normal. Maeola Sarah is slightly slurred. XII-midline tongue extension Motor: Mild weakness with drift against gravity of right upper and lower extremities; moderate weakness of right hand grip compared to left. Motor exam otherwise unremarkable. Sensory: Normal throughout. Deep Tendon Reflexes: 2+ and symmetric. Plantars: Flexor bilaterally Cerebellar: Moderately impaired finger to nose and heel-to-shin testing on the right; normal  on the left. Carotid auscultation: Normal  Ct Head (brain) Wo Contrast  04/14/2012  *RADIOLOGY REPORT*  Clinical Data: Right-sided facial droop, right-sided weakness and lethargy.  CT HEAD WITHOUT CONTRAST  Technique:  Contiguous axial images were obtained from the base of the skull through the vertex without contrast.  Comparison: None.  Findings: The brain demonstrates no  evidence of hemorrhage, infarction, edema, mass effect, extra-axial fluid collection, hydrocephalus or mass lesion.  The skull is unremarkable.  IMPRESSION: No acute findings.   Original Report Authenticated By: Irish Lack, M.D.     Assessment: 57 y.o. 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 Patient is on Coumadin for anticoagulation and INR is slightly subtherapeutic.  Stroke Risk Factors - atrial fibrillation, diabetes mellitus and hypertension  Plan: 1. HgbA1c, fasting lipid panel 2. MRI, MRA  of the brain without contrast 3. PT consult, OT consult, Speech consult 4. Echocardiogram 5. Carotid dopplers 6. Prophylactic therapy-Anticoagulation: Coumadin-per Pharmacy; i.v. Heparin bridge until INR is therapeutic.   C.R. Roseanne Reno, MD [facility] [phone]  04/14/2012, 1:17 PM

## 2012-04-14 NOTE — Code Documentation (Signed)
Code stroke called at 1220, patient arrived to [facility] ED via private vehicle at 1218, EDP seen at 1219, patient in CT scan at 1230, lab at 1230, pharmacy notified for TPA at 1257, and TPA cancelled at 1305.  Stroke team at 1232.  NIHSS 9, LSN at 0900, patient was found by family unable to speak and with right side weakness.

## 2012-04-14 NOTE — ED Provider Notes (Signed)
History     CSN: [id number]  Arrival date & time 04/14/12  1210   First MD Initiated Contact with Patient 04/14/12 1228      Chief Complaint  Patient presents with  . Code Stroke    (Consider location/radiation/quality/duration/timing/severity/associated sxs/prior treatment) HPI Comments: Pt presents to the ED with son for right sided weakness.  Last seen normal around 8:30am when she was eating breakfast.  Micah Flesher outside to gather her laundry from the back porch as she normally does.  Son came home to find her sitting on the couch, naked, and unable to put the rest of her clothes on.  He got her dressed and she continued to have weakness of her right arm- was unable to put on her jacket without assistance.  She was able to ambulate to the car.  Speech is slurred and not clearly understood.  Son notes this is far below her baseline daily functioning.  Recent MAZE procedure for AFIB along with mitral and tricuspid valve replacement 02/15/12 by Dr. Cornelius Moras.  Currently on coumadin.  The history is provided by a relative.    Past Medical History  Diagnosis Date  . Asthma   . Hypertension   . Obesity   . Chronic diastolic congestive heart failure   . Carotid bruit   . Hypercholesterolemia   . Polyp of rectum   . Abnormal chest CT   . Persistent atrial fibrillation   . Bronchospasm 1998  . Atrial enlargement, left     severe  . Heart murmur   . Sleep apnea     DOES NOT HAVE CPAP  . Diabetes mellitus     insulin dependent  . Mitral regurgitation   . Tricuspid regurgitation 01/16/2012  . Rheumatic fever 01/16/2012    Reported during childhood  . Atrial fibrillation 01/05/2011    Chronic persistent, failed DCCV   . Obesity (BMI 30-39.9) 01/16/2012  . Shortness of breath     with exertion  . History of blood transfusion   . 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 S/P Maze operation for atrial fibrillation 02/15/2012    Complete biatrial lesion set using bipolar radiofrequency and cryothermy ablation with clipping of LA appendage    Past Surgical History  Procedure Laterality Date  . Cardiovascular stress test  09/2010  . Cardiac catheterization  >5 years  . Cardioversion  03/25/2011    Procedure: CARDIOVERSION;  Surgeon: Corky Crafts, MD;  Location: [facility] OR;  Service: Cardiovascular;  Laterality: N/A;  . Tee without cardioversion  12/21/2011    Procedure: TRANSESOPHAGEAL ECHOCARDIOGRAM (TEE);  Surgeon: Corky Crafts, MD;  Location: [facility] ENDOSCOPY;  Service: Cardiovascular;  Laterality: N/A;  . No past surgeries    . Tricuspid valve replacement  02/15/2012    Procedure: TRICUSPID VALVE REPAIR;  Surgeon: Purcell Nails, MD;  Location: [facility] OR;  Service: Open Heart Surgery;  Laterality: N/A;  . Maze  02/15/2012    Procedure: MAZE;  Surgeon: Purcell Nails, MD;  Location: [facility] OR;  Service: Open Heart Surgery;  Laterality: N/A;  . Intraoperative transesophageal echocardiogram  02/15/2012    Procedure: INTRAOPERATIVE TRANSESOPHAGEAL ECHOCARDIOGRAM;  Surgeon: Purcell Nails, MD;  Location: [facility] OR;  Service: Open Heart Surgery;  Laterality: N/A;  . Mitral valve replacement  02/15/2012    Procedure: MITRAL VALVE (MV) REPLACEMENT;  Surgeon: Purcell Nails, MD;  Location: [facility] OR;  Service: Open Heart Surgery;  Laterality: N/A;    Family History  Problem Relation Age of Onset  . Asthma Mother     History  Substance Use Topics  . Smoking status: Never Smoker   . Smokeless tobacco: Never Used  . Alcohol Use: No    OB History   Grav Para Term Preterm Abortions TAB SAB Ect Mult Living                  Review of Systems  Neurological: Positive for weakness.  All other systems reviewed and are negative.    Allergies  Review of patient's allergies indicates no known allergies.  Home Medications   Current  Outpatient Rx  Name  Route  Sig  Dispense  Refill  . albuterol (PROVENTIL HFA;VENTOLIN HFA) 108 (90 BASE) MCG/ACT inhaler   Inhalation   Inhale 2 puffs into the lungs every 6 (six) hours as needed.         [name] amiodarone (PACERONE) 200 MG tablet      Take 200 mg by mouth twice daily for 7 days, Then decrease to 200 mg once daily   60 tablet   1   . calcium carbonate (OS-CAL - DOSED IN MG OF ELEMENTAL CALCIUM) 1250 MG tablet   Oral   Take 1 tablet by mouth daily.           . Cholecalciferol (VITAMIN D) 1000 UNITS capsule   Oral   Take 1,000 Units by mouth daily.          [name] dextromethorphan (DELSYM) 30 MG/5ML liquid   Oral   Take 5 mLs (30 mg total) by mouth every 6 (six) hours as needed for cough.   89 mL      . DIOVAN 320 MG tablet   Oral   Take 320 mg by mouth daily. Once daily         . furosemide (LASIX) 40 MG tablet   Oral   Take 1 tablet (40 mg total) by mouth 2 (two) times daily.   60 tablet   11   . insulin glargine (LANTUS SOLOSTAR) 100 UNIT/ML injection   Subcutaneous   Inject 30-35 Units into the skin See admin instructions. Inject 30 units once daily and inject 35 units daily at bedtime.         [name] LIPITOR 40 MG tablet   Oral   Take 40 mg by mouth every evening. Once daily         . metoprolol tartrate (LOPRESSOR) 25 MG tablet   Oral   Take 1 tablet (25 mg total) by mouth 2 (two) times daily.   60 tablet   1   . Omega-3 Fatty Acids (FISH OIL) 1000 MG CAPS   Oral   Take 1 capsule by mouth daily.          [name] oxyCODONE (OXY IR/ROXICODONE) 5 MG immediate release tablet   Oral   Take 1-2 tablets (5-10 mg total) by mouth every 4 (four) hours as needed.   50 tablet   0   . potassium chloride SA (K-DUR,KLOR-CON) 20 MEQ tablet   Oral   Take 1 tablet (20 mEq total) by mouth daily.   30 tablet   11   . sitaGLIPtan-metformin (JANUMET) 50-1000 MG per tablet   Oral   Take 1 tablet by mouth daily.         [name] warfarin (COUMADIN) 4 MG  tablet   Oral   Take 1 tablet (4 mg total) by mouth daily at 6 PM.   30 tablet   1     BP 156/110  Pulse 117  Temp(Src) 98.6 F (37 C) (Oral)  Resp 16  SpO2 99%  Physical Exam  Nursing note and vitals reviewed. Constitutional: She is oriented to person, place, and time. She appears well-developed and well-nourished.  HENT:  Head: Normocephalic and atraumatic.  Mouth/Throat: Oropharynx is clear and moist.  Eyes: Conjunctivae and EOM are normal. Pupils are equal, round, and reactive to light.  Neck: Normal range of motion. Neck supple.  Cardiovascular: Normal rate, regular rhythm and normal heart sounds.   Pulmonary/Chest: Effort normal and breath sounds normal.  Lymphadenopathy:    She has no cervical adenopathy.  Neurological: She is alert and oriented to person, place, and time. No sensory deficit.  R sided UE weakness and facial droop  Skin: Skin is warm and dry.  Psychiatric: She has a normal mood and affect. Her speech is slurred.    ED Course  Procedures (including critical care time)   Date: 04/14/2012  Rate: 115  Rhythm: normal sinus rhythm  QRS Axis: normal  Intervals: PR prolonged  ST/T Wave abnormalities: normal  Conduction Disutrbances:first-degree A-V block  and left bundle branch block  Narrative Interpretation:   Old EKG Reviewed: unchanged    Labs Reviewed  PROTIME-INR - Abnormal; Notable for the following:    Prothrombin Time 20.6 (*)    INR 1.84 (*)    All other components within normal limits  APTT - Abnormal; Notable for the following:    aPTT 43 (*)    All other components within normal limits  CBC - Abnormal; Notable for the following:    Hemoglobin 11.3 (*)    HCT 34.5 (*)    All other components within normal limits  COMPREHENSIVE METABOLIC PANEL - Abnormal; Notable for the following:    Potassium 3.2 (*)    Glucose, Bld 147 (*)    Alkaline Phosphatase 123 (*)    All other components within normal limits  GLUCOSE, CAPILLARY -  Abnormal; Notable for the following:    Glucose-Capillary 145 (*)    All other components within normal limits  POCT I-STAT, CHEM 8 - Abnormal; Notable for the following:    Potassium 3.4 (*)    Glucose, Bld 152 (*)    All other components within normal limits  DIFFERENTIAL  TROPONIN I  POCT I-STAT TROPONIN I   Ct Head (brain) Wo Contrast  04/14/2012  *RADIOLOGY REPORT*  Clinical Data: Right-sided facial droop, right-sided weakness and lethargy.  CT HEAD WITHOUT CONTRAST  Technique:  Contiguous axial images were obtained from the base of the skull through the vertex without contrast.  Comparison: None.  Findings: The brain demonstrates no evidence of hemorrhage, infarction, edema, mass effect, extra-axial fluid collection, hydrocephalus or mass lesion.  The skull is unremarkable.  IMPRESSION: No acute findings.   Original Report Authenticated By: Irish Lack, M.D.      1. Stroke   2. Atrial fibrillation   3. Diabetes mellitus   4. Hypertension   5. CHF (congestive heart failure)   6. CVA (cerebral infarction)   7. S/P mitral valve replacement with metallic valve       MDM   Code stroke called upon arrival.  Spoke with Dr. Roseanne Reno- pt is not a tpa candidate due to current coumadin therapy.  Continues to have R facial droop with slurred  speech.  Pt will be admitted for further evaluation.       Garlon Hatchet, PA-C 04/14/12 1909

## 2012-04-14 NOTE — ED Notes (Signed)
Per son- pt woke up and had breakfast with her husband and was normal he left at 9am and she was still normal then her son found her semi-lethargic, right sided facial droop and having a hard time walking. Pt takes coumadin.

## 2012-04-14 NOTE — ED Provider Notes (Signed)
Level V caveat urgently for intervention. History is obtained from patient's son and daughter Patient was seen normal a.m. today. Developed right-sided weakness and difficulty getting dressed this morning per her family member. Patient offers no complaint on exam she is alert HEENT exam there is a right sided mouth droop. Heart regular rate and rhythm neurologic alert awake follows commands right-sided central cranial nerve 7 deficit other cranial nerves II through XII grossly intact. Speech is slurred motor strength is 5 over 5 overall  Code stroke called  Spoke with Dr. Roseanne Reno to evaluate patient. Patient is not a candidate for TPA in light of current Coumadin therapy.. At 1:10 PM patient remains a phasic. She follows simple commands. Continues to have right mouth droop moves all extremities exam essentially unchanged  Results for orders placed during the hospital encounter of 04/14/12  PROTIME-INR      Result Value Range   Prothrombin Time 20.6 (*) 11.6 - 15.2 seconds   INR 1.84 (*) 0.00 - 1.49  APTT      Result Value Range   aPTT 43 (*) 24 - 37 seconds  CBC      Result Value Range   WBC 5.9  4.0 - 10.5 K/uL   RBC 3.95  3.87 - 5.11 MIL/uL   Hemoglobin 11.3 (*) 12.0 - 15.0 g/dL   HCT 16.1 (*) 09.6 - 04.5 %   MCV 87.3  78.0 - 100.0 fL   MCH 28.6  26.0 - 34.0 pg   MCHC 32.8  30.0 - 36.0 g/dL   RDW 40.9  81.1 - 91.4 %   Platelets 195  150 - 400 K/uL  DIFFERENTIAL      Result Value Range   Neutrophils Relative 63  43 - 77 %   Neutro Abs 3.7  1.7 - 7.7 K/uL   Lymphocytes Relative 23  12 - 46 %   Lymphs Abs 1.4  0.7 - 4.0 K/uL   Monocytes Relative 11  3 - 12 %   Monocytes Absolute 0.6  0.1 - 1.0 K/uL   Eosinophils Relative 3  0 - 5 %   Eosinophils Absolute 0.2  0.0 - 0.7 K/uL   Basophils Relative 0  0 - 1 %   Basophils Absolute 0.0  0.0 - 0.1 K/uL  COMPREHENSIVE METABOLIC PANEL      Result Value Range   Sodium 137  135 - 145 mEq/L   Potassium 3.2 (*) 3.5 - 5.1 mEq/L   Chloride  102  96 - 112 mEq/L   CO2 25  19 - 32 mEq/L   Glucose, Bld 147 (*) 70 - 99 mg/dL   BUN 10  6 - 23 mg/dL   Creatinine, Ser 7.82  0.50 - 1.10 mg/dL   Calcium 9.3  8.4 - 95.6 mg/dL   Total Protein 8.2  6.0 - 8.3 g/dL   Albumin 3.6  3.5 - 5.2 g/dL   AST 27  0 - 37 U/L   ALT 20  0 - 35 U/L   Alkaline Phosphatase 123 (*) 39 - 117 U/L   Total Bilirubin 0.7  0.3 - 1.2 mg/dL   GFR calc non Af Amer >90  >90 mL/min   GFR calc Af Amer >90  >90 mL/min  TROPONIN I      Result Value Range   Troponin I <0.30  <0.30 ng/mL  GLUCOSE, CAPILLARY      Result Value Range   Glucose-Capillary 145 (*) 70 - 99 mg/dL  POCT I-STAT, CHEM  8      Result Value Range   Sodium 143  135 - 145 mEq/L   Potassium 3.4 (*) 3.5 - 5.1 mEq/L   Chloride 104  96 - 112 mEq/L   BUN 9  6 - 23 mg/dL   Creatinine, Ser 1.61  0.50 - 1.10 mg/dL   Glucose, Bld 096 (*) 70 - 99 mg/dL   Calcium, Ion 0.45  4.09 - 1.23 mmol/L   TCO2 25  0 - 100 mmol/L   Hemoglobin 12.6  12.0 - 15.0 g/dL   HCT 81.1  91.4 - 78.2 %  POCT I-STAT TROPONIN I      Result Value Range   Troponin i, poc 0.04  0.00 - 0.08 ng/mL   Comment 3            Dg Chest 2 View  03/19/2012  *RADIOLOGY REPORT*  Clinical Data: History of heart surgery.  CHEST - 2 VIEW  Comparison: 02/24/2012.  Findings: Trachea is midline.  Heart is enlarged, stable.  Right PICC has been removed.  Lungs are somewhat low in volume but clear. No pleural fluid.  IMPRESSION: Low lung volumes without acute finding.   Original Report Authenticated By: Leanna Battles, M.D.    Ct Head (brain) Wo Contrast  04/14/2012  *RADIOLOGY REPORT*  Clinical Data: Right-sided facial droop, right-sided weakness and lethargy.  CT HEAD WITHOUT CONTRAST  Technique:  Contiguous axial images were obtained from the base of the skull through the vertex without contrast.  Comparison: None.  Findings: The brain demonstrates no evidence of hemorrhage, infarction, edema, mass effect, extra-axial fluid collection,  hydrocephalus or mass lesion.  The skull is unremarkable.  IMPRESSION: No acute findings.   Original Report Authenticated By: Irish Lack, M.D.     Spoke with Dr. Carmell Austria, plan admit to telemetry service. Note pt did not pass swallowing screen Diagnosis#1acute stroke #2 hyperglycemia CRITICAL CARE Performed by: Doug Sou   Total critical care time: 30 minute  Critical care time was exclusive of separately billable procedures and treating other patients.  Critical care was necessary to treat or prevent imminent or life-threatening deterioration.  Critical care was time spent personally by me on the following activities: development of treatment plan with patient and/or surrogate as well as nursing, discussions with consultants, evaluation of patient's response to treatment, examination of patient, obtaining history from patient or surrogate, ordering and performing treatments and interventions, ordering and review of laboratory studies, ordering and review of radiographic studies, pulse oximetry and re-evaluation of patient's condition.  Doug Sou, MD 04/14/12 [phone]

## 2012-04-14 NOTE — Progress Notes (Signed)
Pt has not voided since before admission to ED this afternoon.  Pt does not feel need to void now.  Bladder scan done, 120cc in bladder.  Will continue to monitor.  Roselie Awkward, RN

## 2012-04-14 NOTE — Progress Notes (Signed)
ANTICOAGULATION CONSULT NOTE - Initial Consult  Pharmacy Consult for UFH Indication: h/o afib/MVR  No Known Allergies  Patient Measurements: Height: 5' 6.14" (168 cm) Weight: 210 lb 15.7 oz (95.7 kg) IBW/kg (Calculated) : 59.63 Heparin Dosing Weight: 81kg   Vital Signs: Temp: 98.5 F (36.9 C) (04/05 1313) Temp src: Oral (04/05 1224) BP: 135/95 mmHg (04/05 1400) Pulse Rate: 113 (04/05 1400)  Labs:  Recent Labs  04/14/12 1225 04/14/12 1240  HGB 11.3* 12.6  HCT 34.5* 37.0  PLT 195  --   APTT 43*  --   LABPROT 20.6*  --   INR 1.84*  --   CREATININE 0.76 0.80  TROPONINI <0.30  --     Estimated Creatinine Clearance: 90.6 ml/min (by C-G formula based on Cr of 0.8).   Medical History: Past Medical History  Diagnosis Date  . Asthma   . Hypertension   . Obesity   . Chronic diastolic congestive heart failure   . Carotid bruit   . Hypercholesterolemia   . Polyp of rectum   . Abnormal chest CT   . Persistent atrial fibrillation   . Bronchospasm 1998  . Atrial enlargement, left     severe  . Heart murmur   . Sleep apnea     DOES NOT HAVE CPAP  . Diabetes mellitus     insulin dependent  . Mitral regurgitation   . Tricuspid regurgitation 01/16/2012  . Rheumatic fever 01/16/2012    Reported during childhood  . Atrial fibrillation 01/05/2011    Chronic persistent, failed DCCV   . Obesity (BMI 30-39.9) 01/16/2012  . Shortness of breath     with exertion  . History of blood transfusion   . 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 S/P Maze operation for atrial fibrillation 02/15/2012    Complete biatrial lesion set using bipolar radiofrequency and cryothermy ablation with clipping of LA appendage    Medications:   (Not in a hospital admission)  Assessment: 57 y/o female patient admitted with acute right sided  weakness and speech difficulty, found to have probable left MCA ischemic infarction, not TPA candidate given chronic coumadin treatment. INR currently subtherapeutic, will bridge with heparin , no bolus.  Goal of Therapy:  Heparin level 0.3-0.5 units/ml Monitor platelets by anticoagulation protocol: Yes   Plan:  Begin heparin gtt at 1100 units/hr, check 6 hour heparin level with daily cbc and heparin level.  Verlene Mayer, PharmD, BCPS Pager [phone] 04/14/2012,3:09 PM

## 2012-04-14 NOTE — Consult Note (Signed)
CARDIOLOGY CONSULT NOTE      Primary Care Physician: Hulda Humphrey, NP Referring Physician:  Dr Sunnie Nielsen  Admit Date: 04/14/2012  Reason for consultation:  Stroke, afib, s/p MVR  [name] is a 57 y.o. female with a h/o persistent atrial fibrillation, rheumatic heart disease, and recently valve surgery who presents with acute CVA.  She has a longstanding history of rheumatic heart disease and atrial fibrillation.  She underwent mitral valve replacement with a Sorin Carbomedics Optiform mechanical prosthesis (size 31mm), tricuspid valve repair- Edwards mc3 ring annuloplasty (size 28mm), and complete biatrial lesion set MAZE using bipolar radiofrequency and cryothermy ablation with clipping of left atrial appendage on 02/15/12.  She did reasonable well post operatively and was discharged.  She now presents with sudden onset of dysphasia and right sided weakness.  This began at 9am today.  She feels that this is slowly resolving.  She was found to have a subtherapeutic INR upon arrival but no indication for lytics.  She is admitted for further management.   Today, she denies symptoms of palpitations, chest pain, shortness of breath, lower extremity edema, dizziness, presyncope, or syncope. The patient is otherwise without complaint today.   Past Medical History  Diagnosis Date  . Asthma   . Hypertension   . Obesity   . Chronic diastolic congestive heart failure   . Carotid bruit   . Hypercholesterolemia   . Polyp of rectum   . Abnormal chest CT   . Persistent atrial fibrillation   . Bronchospasm 1998  . Atrial enlargement, left     severe  . Heart murmur   . Sleep apnea     DOES NOT HAVE CPAP  . Diabetes mellitus     insulin dependent  . Mitral regurgitation   . Tricuspid regurgitation 01/16/2012  . Rheumatic fever 01/16/2012    Reported during childhood  . Atrial fibrillation 01/05/2011    Chronic persistent, failed DCCV   . Obesity (BMI 30-39.9) 01/16/2012  . Shortness of  breath     with exertion  . History of blood transfusion   . 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 S/P Maze operation for atrial fibrillation 02/15/2012    Complete biatrial lesion set using bipolar radiofrequency and cryothermy ablation with clipping of LA appendage   Past Surgical History  Procedure Laterality Date  . Cardiovascular stress test  09/2010  . Cardiac catheterization  >5 years  . Cardioversion  03/25/2011    Procedure: CARDIOVERSION;  Surgeon: Corky Crafts, MD;  Location: [facility] OR;  Service: Cardiovascular;  Laterality: N/A;  . Tee without cardioversion  12/21/2011    Procedure: TRANSESOPHAGEAL ECHOCARDIOGRAM (TEE);  Surgeon: Corky Crafts, MD;  Location: [facility] ENDOSCOPY;  Service: Cardiovascular;  Laterality: N/A;  . No past surgeries    . Tricuspid valve replacement  02/15/2012    Procedure: TRICUSPID VALVE REPAIR;  Surgeon: Purcell Nails, MD;  Location: [facility] OR;  Service: Open Heart Surgery;  Laterality: N/A;  . Maze  02/15/2012    Procedure: MAZE;  Surgeon: Purcell Nails, MD;  Location: [facility] OR;  Service: Open Heart Surgery;  Laterality: N/A;  . Intraoperative transesophageal echocardiogram  02/15/2012    Procedure: INTRAOPERATIVE TRANSESOPHAGEAL ECHOCARDIOGRAM;  Surgeon: Purcell Nails, MD;  Location: [facility] OR;  Service: Open Heart Surgery;  Laterality: N/A;  . Mitral  valve replacement  02/15/2012    Procedure: MITRAL VALVE (MV) REPLACEMENT;  Surgeon: Purcell Nails, MD;  Location: [facility] OR;  Service: Open Heart Surgery;  Laterality: N/A;    . amiodarone  200 mg Oral Daily  . atorvastatin  40 mg Oral QPM  . insulin aspart  0-9 Units Subcutaneous TID WC  . metoprolol tartrate  25 mg Oral BID  . potassium chloride  10 mEq Intravenous Q1 Hr x 2   . heparin      No Known Allergies  History   Social History  .  Marital Status: Married    Spouse Name: N/A    Number of Children: N/A  . 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 Smoking status: Never Smoker   . Smokeless tobacco: Never Used  . Alcohol Use: No  . Drug Use: No  . Sexually Active:    Other Topics Concern  . Not on file   Social History Narrative   Pt lives in [place] with spouse.  Works at [employer]. 2 grown children, 1 grandchild    Family History  Problem Relation Age of Onset  . Asthma Mother     ROS- All systems are reviewed and negative except as per the HPI above  Physical Exam: Telemetry: Filed Vitals:   04/14/12 1430 04/14/12 1445 04/14/12 1500 04/14/12 1540  BP: 136/89 130/80 133/95 143/91  Pulse: 112 113 113 115  Temp:    98 F (36.7 C)  TempSrc:    Oral  Resp: 19 24 25 23   Height:    5\' 6"  (1.676 m)  Weight:    216 lb 14.9 oz (98.4 kg)  SpO2: 99% 98% 98% 99%    GEN- The patient is ill appearing, alert and oriented x 3 today.   Head- normocephalic, atraumatic, obvious facial droop (R) Eyes-  Sclera clear, conjunctiva pink Ears- hearing intact Oropharynx- clear Neck- supple, no JVP Lungs- Clear to ausculation bilaterally, normal work of breathing Heart- tachycardic regular rhythm, crisp mechanical S1, no rubs/ gallops GI- soft, NT, ND, + BS Extremities- no clubbing, cyanosis, or edema MS- no significant deformity or atrophy Skin- no rash or lesion Psych- euthymic mood, full affect Neuro- moderate R sided facial droop, mild weakness of R upper and lower extremities  EKG:  Probably atypical atrial flutter, LBBB  Labs:   Lab Results  Component Value Date   WBC 5.9 04/14/2012   HGB 12.6 04/14/2012   HCT 37.0 04/14/2012   MCV 87.3 04/14/2012   PLT 195 04/14/2012    Recent Labs Lab 04/14/12 1225 04/14/12 1240  NA 137 143  K 3.2* 3.4*  CL 102 104  CO2 25  --   BUN 10 9  CREATININE 0.76 0.80    CALCIUM 9.3  --   PROT 8.2  --   BILITOT 0.7  --   ALKPHOS 123*  --   ALT 20  --   AST 27  --   GLUCOSE 147* 152*   Lab Results  Component Value Date   CKTOTAL 87 01/06/2011   CKMB 2.8 01/06/2011   TROPONINI <0.30 04/14/2012    Lab Results  Component Value Date   CHOL 133 09/18/2010   Lab Results  Component Value Date   HDL 25* 09/18/2010   Lab Results  Component Value Date   LDLCALC 88 09/18/2010   Lab Results  Component Value Date  TRIG 100 09/18/2010   Lab Results  Component Value Date   CHOLHDL 5.3 09/18/2010   No results found for this basename: LDLDIRECT    Echo:  pending  ASSESSMENT AND PLAN:   1. Acute CVA The patient presents with acute CVA.  She and her family feel that she is gradually improving.  Her head CT is without hemorrhage and per Dr Zannie Kehr note, she is a candidate for heparin bridge until INR is therapeutic.  I would therefore favor heparin bridge as long as neurology feels this is ok. Goal INR 2.5-3.5.  2. Mechanical MV/ s/p tricuspid repair/ Maze The patient present is s/p recent valve surgery.  Her INR is subtherapeutic, though she is hemodynamically stable and her cardiac exam is ok.  Will order an echo to evaluate her prosthesis. Heparin drip as above if ok with neurology.  3. Afib/ atypical flutter She appears to be in atypical atrial flutter at this time.  Not a candidate for cardioversion given recent CVA and subtherapeutic INR.  Will continue to follow clinically.   She is s/p LAA ligation.  Hold amiodarone.  Restart metoprolol and coumadin once taking POs.  I would not treat tachycardia unless HR >130s or she becomes symptomatic.  At that point,we could use prn lopressor IV or cardizem while NPO.   Hillis Range, MD 04/14/2012  4:34 PM

## 2012-04-14 NOTE — ED Notes (Signed)
CBG 145  

## 2012-04-14 NOTE — H&P (Signed)
[facility] History and Physical  [name] ZOX:[id number] DOB: 08/01/1955 DOA: 04/14/2012  Referring physician: Dr Ethelda Chick PCP: Hulda Humphrey, NP  Specialists: Dr Roseanne Reno, Dr Eldridge Dace.   Chief Complaint: slurred speech, right arm weakness.   HPI: [name] is a 57 y.o. 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 Probably last time she was seen normal was at 8: 50. She was found by her son sitting in the couch, weak, unable to speak. Patient was initially a code stroke, patient was not candidate for TPA due to elevated INR at 1.8. During my evaluation patient has expressive and dysarthric aphasia. She thought that her daughter was her sister. She is following some command. She denies chest pain and SOB.   Review of Systems: unable to obtain due to medical condition.   Past Medical History  Diagnosis Date  . Asthma   . Hypertension   . Obesity   . Chronic diastolic congestive heart failure   . Carotid bruit   . Hypercholesterolemia   . Polyp of rectum   . Abnormal chest CT   . Persistent atrial fibrillation   . Bronchospasm 1998  . Atrial enlargement, left     severe  . Heart murmur   . Sleep apnea     DOES NOT HAVE CPAP  . Diabetes mellitus     insulin dependent  . Mitral regurgitation   . Tricuspid regurgitation 01/16/2012  . Rheumatic fever 01/16/2012    Reported during childhood  . Atrial fibrillation 01/05/2011    Chronic persistent, failed DCCV   . Obesity (BMI 30-39.9) 01/16/2012  . Shortness of breath     with exertion  . History of blood transfusion   . 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 S/P Maze operation for atrial  fibrillation 02/15/2012    Complete biatrial lesion set using bipolar radiofrequency and cryothermy ablation with clipping of LA appendage   Past Surgical History  Procedure Laterality Date  . Cardiovascular stress test  09/2010  . Cardiac catheterization  >5 years  . Cardioversion  03/25/2011    Procedure: CARDIOVERSION;  Surgeon: Corky Crafts, MD;  Location: [facility] OR;  Service: Cardiovascular;  Laterality: N/A;  . Tee without cardioversion  12/21/2011    Procedure: TRANSESOPHAGEAL ECHOCARDIOGRAM (TEE);  Surgeon: Corky Crafts, MD;  Location: [facility] ENDOSCOPY;  Service: Cardiovascular;  Laterality: N/A;  . No past surgeries    . Tricuspid valve replacement  02/15/2012    Procedure: TRICUSPID VALVE REPAIR;  Surgeon: Purcell Nails, MD;  Location: [facility] OR;  Service: Open Heart Surgery;  Laterality: N/A;  . Maze  02/15/2012    Procedure: MAZE;  Surgeon: Purcell Nails, MD;  Location: [facility] OR;  Service: Open Heart Surgery;  Laterality: N/A;  . Intraoperative transesophageal echocardiogram  02/15/2012    Procedure: INTRAOPERATIVE TRANSESOPHAGEAL ECHOCARDIOGRAM;  Surgeon: Purcell Nails, MD;  Location: [facility] OR;  Service: Open Heart Surgery;  Laterality: N/A;  . Mitral valve replacement  02/15/2012    Procedure: MITRAL VALVE (MV) REPLACEMENT;  Surgeon: Purcell Nails, MD;  Location: [facility] OR;  Service: Open Heart Surgery;  Laterality: N/A;   Social History:  reports that she  has never smoked. She has never used smokeless tobacco. She reports that she does not drink alcohol or use illicit drugs.   No Known Allergies  Family History  Problem Relation Age of Onset  . Asthma Mother     Prior to Admission medications   Medication Sig Start Date End Date Taking? Authorizing Provider  albuterol (PROVENTIL HFA;VENTOLIN HFA) 108 (90 BASE) MCG/ACT inhaler Inhale 2 puffs into the lungs every 6 (six) hours as needed. For wheezing   Yes Historical Provider, MD  amiodarone (PACERONE) 200 MG tablet Take 200 mg  by mouth See admin instructions. Take 200 mg by mouth twice daily for 7 days, Then decrease to 200 mg once daily 02/24/12  Yes Erin Barrett, PA-C  calcium carbonate (OS-CAL - DOSED IN MG OF ELEMENTAL CALCIUM) 1250 MG tablet Take 1 tablet by mouth daily.     Yes Historical Provider, MD  Cholecalciferol (VITAMIN D) 1000 UNITS capsule Take 1,000 Units by mouth daily.    Yes Historical Provider, MD  DIOVAN 320 MG tablet Take 320 mg by mouth daily.  10/26/10  Yes Historical Provider, MD  furosemide (LASIX) 40 MG tablet Take 1 tablet (40 mg total) by mouth 2 (two) times daily. 12/21/11  Yes Corky Crafts, MD  insulin glargine (LANTUS SOLOSTAR) 100 UNIT/ML injection Inject 30-35 Units into the skin See admin instructions. Inject 30 units once daily and inject 35 units daily at bedtime.   Yes Historical Provider, MD  LIPITOR 40 MG tablet Take 40 mg by mouth every evening. Once daily 10/20/10  Yes Historical Provider, MD  metoprolol tartrate (LOPRESSOR) 25 MG tablet Take 1 tablet (25 mg total) by mouth 2 (two) times daily. 02/24/12  Yes Erin Barrett, PA-C  Omega-3 Fatty Acids (FISH OIL) 1000 MG CAPS Take 1 capsule by mouth daily.    Yes Historical Provider, MD  oxyCODONE (OXY IR/ROXICODONE) 5 MG immediate release tablet Take 5-10 mg by mouth every 4 (four) hours as needed. For pain 02/24/12  Yes Erin Barrett, PA-C  potassium chloride SA (K-DUR,KLOR-CON) 20 MEQ tablet Take 1 tablet (20 mEq total) by mouth daily. 12/21/11  Yes Corky Crafts, MD  sitaGLIPtan-metformin (JANUMET) 50-1000 MG per tablet Take 1 tablet by mouth daily. 01/01/12  Yes Corky Crafts, MD  warfarin (COUMADIN) 4 MG tablet Take by mouth See admin instructions. Current dose of coumadin unknown per family 02/24/12  Yes Lowella Dandy, PA-C   Physical Exam: Filed Vitals:   04/14/12 1315 04/14/12 1330 04/14/12 1345 04/14/12 1400  BP: 132/84 127/80 132/92 135/95  Pulse: 113 114 114 113  Temp:      TempSrc:      Resp: 25 26 25  29   SpO2: 99% 98% 100% 99%     General: No distress.   Eyes: PERLA.   ENT: No tonsillar enlargement, no erythema.   Neck: supple, no thyromegaly.   Cardiovascular: S 1, S 2 RRR, click sound mechanical valve.   Respiratory: sporadic ronchus, no wheezes, sporadic crackles.   Abdomen: BS present, soft, NT, ND.  Skin: No rashes.   Musculoskeletal: no edema.   Psychiatric: Flat affect.  Neurologic: Expressive aphasia and dysarthria, right arm drop, 3/5. Right facial drop. LE 5/5.   Labs on Admission:  Basic Metabolic Panel:  Recent Labs Lab 04/14/12 1225 04/14/12 1240  NA 137 143  K 3.2* 3.4*  CL 102 104  CO2 25  --   GLUCOSE 147* 152*  BUN 10 9  CREATININE 0.76 0.80  CALCIUM 9.3  --    Liver Function Tests:  Recent Labs Lab 04/14/12 1225  AST 27  ALT 20  ALKPHOS 123*  BILITOT 0.7  PROT 8.2  ALBUMIN 3.6   CBC:  Recent Labs Lab 04/14/12 1225 04/14/12 1240  WBC 5.9  --   NEUTROABS 3.7  --   HGB 11.3* 12.6  HCT 34.5* 37.0  MCV 87.3  --   PLT 195  --    Cardiac Enzymes:  Recent Labs Lab 04/14/12 1225  TROPONINI <0.30    BNP (last 3 results)  Recent Labs  12/19/11 0431 12/19/11 1145 02/24/12 0600  PROBNP 1004.0* 1138.0* 1163.0*   CBG:  Recent Labs Lab 04/14/12 1251  GLUCAP 145*    Radiological Exams on Admission: Ct Head (brain) Wo Contrast  04/14/2012  *RADIOLOGY REPORT*  Clinical Data: Right-sided facial droop, right-sided weakness and lethargy.  CT HEAD WITHOUT CONTRAST  Technique:  Contiguous axial images were obtained from the base of the skull through the vertex without contrast.  Comparison: None.  Findings: The brain demonstrates no evidence of hemorrhage, infarction, edema, mass effect, extra-axial fluid collection, hydrocephalus or mass lesion.  The skull is unremarkable.  IMPRESSION: No acute findings.   Original Report Authenticated By: Irish Lack, M.D.     EKG: Independently reviewed. Sinus tachycardia.    Assessment/Plan Principal Problem:   CVA (cerebral infarction) Active Problems:   Hypertension   Diabetes mellitus   Atrial fibrillation   CHF (congestive heart failure)   S/P mitral valve replacement with metallic valve  1-Acute Stroke: In a patient with Mechanical valve, INR not therapeutic at 1.8. Admit to step down unit. Patient will be on IV heparin until INR at goal. When she pass swallow evaluation need to resume coumadin. Will order ECHO, carotid doppler. Will review with MRI department if patient valve is compatible with MRI. PT, OT speech evaluation. If patient fail swallow evaluation will need to consider NG tube. Permissive HTN in setting acute stroke. Will do IV hydralazine for SBP more than 200, diastolic more than 110.   2-S/P Mechanical Mitral Valve last month/ A fib, Diastolic HF: Ordered ECHO, I asked cardio to read ECHO and informed them of patient admission. Continue with metoprolol, amiodarone. Hold lasix until patient tolerating oral. NSL. Heparin Gtt per pharmacy.   3-Diabetes: Hold long acting while NPO. SSI.   4.Hypokalemia: Replace with KCl times 2 runs.     Family Communication: Care discussed with Daughter who was at bedside.  Disposition Plan: Expect 3 to 4 days.   Time spent: 70 minutes.   Chetara Kropp [facility] Pager [phone]  If 7PM-7AM, please contact night-coverage [email or web address] Password [facility] 04/14/2012, 2:53 PM

## 2012-04-14 NOTE — Progress Notes (Signed)
ANTICOAGULATION CONSULT NOTE - Follow-up Consult  Pharmacy Consult for UFH Indication: h/o afib/MVR  No Known Allergies  Patient Measurements: Height: 5\' 6"  (167.6 cm) Weight: 216 lb 14.9 oz (98.4 kg) IBW/kg (Calculated) : 59.3 Heparin Dosing Weight: 81kg   Vital Signs: Temp: 97.7 F (36.5 C) (04/05 2009) Temp src: Oral (04/05 2009) BP: 146/101 mmHg (04/05 2009) Pulse Rate: 115 (04/05 2009)  Labs:  Recent Labs  04/14/12 1225 04/14/12 1240 04/14/12 2213  HGB 11.3* 12.6  --   HCT 34.5* 37.0  --   PLT 195  --   --   APTT 43*  --   --   LABPROT 20.6*  --   --   INR 1.84*  --   --   HEPARINUNFRC  --   --  0.16*  CREATININE 0.76 0.80  --   TROPONINI <0.30  --   --     Estimated Creatinine Clearance: 91.7 ml/min (by C-G formula based on Cr of 0.8).   Medical History: Past Medical History  Diagnosis Date  . Asthma   . Hypertension   . Obesity   . Chronic diastolic congestive heart failure   . Carotid bruit   . Hypercholesterolemia   . Polyp of rectum   . Abnormal chest CT   . Persistent atrial fibrillation   . Bronchospasm 1998  . Atrial enlargement, left     severe  . Heart murmur   . Sleep apnea     DOES NOT HAVE CPAP  . Diabetes mellitus     insulin dependent  . Mitral regurgitation   . Tricuspid regurgitation 01/16/2012  . Rheumatic fever 01/16/2012    Reported during childhood  . Atrial fibrillation 01/05/2011    Chronic persistent, failed DCCV   . Obesity (BMI 30-39.9) 01/16/2012  . Shortness of breath     with exertion  . History of blood transfusion   . 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 S/P Maze operation for atrial fibrillation 02/15/2012    Complete biatrial lesion set using bipolar radiofrequency and cryothermy ablation with clipping of LA appendage    Medications:  Prescriptions  prior to admission  Medication Sig Dispense Refill  . albuterol (PROVENTIL HFA;VENTOLIN HFA) 108 (90 BASE) MCG/ACT inhaler Inhale 2 puffs into the lungs every 6 (six) hours as needed. For wheezing      . amiodarone (PACERONE) 200 MG tablet Take 200 mg by mouth daily.      . calcium carbonate (OS-CAL - DOSED IN MG OF ELEMENTAL CALCIUM) 1250 MG tablet Take 1 tablet by mouth daily.        . Cholecalciferol (VITAMIN D) 1000 UNITS capsule Take 1,000 Units by mouth daily.       [name] DIOVAN 320 MG tablet Take 320 mg by mouth daily.       . furosemide (LASIX) 40 MG tablet Take 1 tablet (40 mg total) by mouth 2 (two) times daily.  60 tablet  11  . insulin glargine (LANTUS SOLOSTAR) 100 UNIT/ML injection Inject 30-35 Units into the skin See admin instructions. Inject 30 units once daily and inject 35 units daily at bedtime.      [name] LIPITOR 40 MG tablet Take 40 mg by mouth every evening. Once daily      . metoprolol tartrate (LOPRESSOR) 25 MG tablet Take 1 tablet (25  mg total) by mouth 2 (two) times daily.  60 tablet  1  . Omega-3 Fatty Acids (FISH OIL) 1000 MG CAPS Take 1 capsule by mouth daily.       [name] oxyCODONE (OXY IR/ROXICODONE) 5 MG immediate release tablet Take 5-10 mg by mouth every 4 (four) hours as needed. For pain      . potassium chloride SA (K-DUR,KLOR-CON) 20 MEQ tablet Take 1 tablet (20 mEq total) by mouth daily.  30 tablet  11  . sitaGLIPtan-metformin (JANUMET) 50-1000 MG per tablet Take 1 tablet by mouth daily.      [name] warfarin (COUMADIN) 4 MG tablet Take by mouth See admin instructions. 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 INR currently subtherapeutic, on heparin bridge, no bolus.  Heparin level (0.16) is below-goal on 1100 units/hr. No problem with line / infusion and no bleeding  per RN.   Goal of Therapy:  Heparin level 0.3-0.5 units/ml Monitor platelets by anticoagulation protocol: Yes   Plan:  1. Increase IV heparin to 1400 units/hr.  2. Heparin level in 6 hours.   Lorre Munroe, PharmD 04/14/2012,10:55 PM

## 2012-04-15 ENCOUNTER — Inpatient Hospital Stay (HOSPITAL_COMMUNITY): Payer: BC Managed Care – PPO

## 2012-04-15 LAB — LIPID PANEL
Cholesterol: 169 mg/dL (ref 0–200)
HDL: 27 mg/dL — ABNORMAL LOW (ref >39)
Triglycerides: 78 mg/dL (ref <150)

## 2012-04-15 LAB — GLUCOSE, CAPILLARY
Glucose-Capillary: 87 mg/dL (ref 70–99)
Glucose-Capillary: 94 mg/dL (ref 70–99)
Glucose-Capillary: 82 mg/dL (ref 70–99)

## 2012-04-15 LAB — CBC
HCT: 33.7 % — ABNORMAL LOW (ref 36.0–46.0)
Hemoglobin: 11 g/dL — ABNORMAL LOW (ref 12.0–15.0)
RBC: 3.84 MIL/uL — ABNORMAL LOW (ref 3.87–5.11)
RDW: 14.1 % (ref 11.5–15.5)
WBC: 6 10*3/uL (ref 4.0–10.5)

## 2012-04-15 LAB — HEPARIN LEVEL (UNFRACTIONATED)
Heparin Unfractionated: 0.24 IU/mL — ABNORMAL LOW (ref 0.30–0.70)
Heparin Unfractionated: 0.55 IU/mL (ref 0.30–0.70)

## 2012-04-15 MED ORDER — IRBESARTAN 300 MG PO TABS
300.0000 mg | ORAL_TABLET | Freq: Every day | ORAL | Status: DC
  Administered 2012-04-16 – 2012-04-19 (×4): 300 mg via ORAL
  Filled 2012-04-15 (×4): qty 1

## 2012-04-15 MED ORDER — WARFARIN SODIUM 4 MG PO TABS
8.0000 mg | ORAL_TABLET | Freq: Once | ORAL | Status: AC
  Administered 2012-04-15: 8 mg via ORAL
  Filled 2012-04-15: qty 2

## 2012-04-15 MED ORDER — POTASSIUM CHLORIDE 20 MEQ/15ML (10%) PO LIQD
20.0000 meq | Freq: Every day | ORAL | Status: DC
  Administered 2012-04-15 – 2012-04-19 (×5): 20 meq via ORAL
  Filled 2012-04-15 (×5): qty 15

## 2012-04-15 MED ORDER — HEPARIN (PORCINE) IN NACL 100-0.45 UNIT/ML-% IJ SOLN
1300.0000 [IU]/h | INTRAMUSCULAR | Status: DC
  Administered 2012-04-16: 1500 [IU]/h via INTRAVENOUS
  Administered 2012-04-16: 1300 [IU]/h via INTRAVENOUS
  Administered 2012-04-17: 1250 [IU]/h via INTRAVENOUS
  Administered 2012-04-18 – 2012-04-19 (×3): 1300 [IU]/h via INTRAVENOUS
  Filled 2012-04-15 (×8): qty 250

## 2012-04-15 MED ORDER — WARFARIN - PHARMACIST DOSING INPATIENT
Freq: Every day | Status: DC
  Administered 2012-04-18: 18:00:00

## 2012-04-15 MED ORDER — HYDRALAZINE HCL 20 MG/ML IJ SOLN: 10.0000 mg | Freq: Three times a day (TID) | INTRAMUSCULAR | Status: DC | PRN

## 2012-04-15 MED ORDER — POTASSIUM CHLORIDE CRYS ER 20 MEQ PO TBCR: 20.0000 meq | EXTENDED_RELEASE_TABLET | Freq: Every day | ORAL | Status: DC

## 2012-04-15 MED ORDER — ATORVASTATIN CALCIUM 80 MG PO TABS
80.0000 mg | ORAL_TABLET | Freq: Every evening | ORAL | Status: DC
  Administered 2012-04-15 – 2012-04-18 (×4): 80 mg via ORAL
  Filled 2012-04-15 (×6): qty 1

## 2012-04-15 MED ORDER — AMIODARONE HCL 200 MG PO TABS
200.0000 mg | ORAL_TABLET | Freq: Every day | ORAL | Status: DC
  Filled 2012-04-15: qty 1

## 2012-04-15 NOTE — Procedures (Signed)
Objective Swallowing Evaluation: Bedside swallow evaluation  Patient Details  Name: [name] MRN: [id number] Date of Birth: Nov 04, 1955  Today's Date: 04/15/2012 Time: 1120-1140 SLP Time Calculation (min): 20 min  Past Medical History:  Past Medical History  Diagnosis Date  . Asthma   . Hypertension   . Obesity   . Chronic diastolic congestive heart failure   . Carotid bruit   . Hypercholesterolemia   . Polyp of rectum   . Abnormal chest CT   . Persistent atrial fibrillation   . Bronchospasm 1998  . Atrial enlargement, left     severe  . Heart murmur   . Sleep apnea     DOES NOT HAVE CPAP  . Diabetes mellitus     insulin dependent  . Mitral regurgitation   . Tricuspid regurgitation 01/16/2012  . Rheumatic fever 01/16/2012    Reported during childhood  . Atrial fibrillation 01/05/2011    Chronic persistent, failed DCCV   . Obesity (BMI 30-39.9) 01/16/2012  . Shortness of breath     with exertion  . History of blood transfusion   . 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 S/P Maze operation for atrial fibrillation 02/15/2012    Complete biatrial lesion set using bipolar radiofrequency and cryothermy ablation with clipping of LA appendage   Past Surgical History:  Past Surgical History  Procedure Laterality Date  . Cardiovascular stress test  09/2010  . Cardiac catheterization  >5 years  . Cardioversion  03/25/2011    Procedure: CARDIOVERSION;  Surgeon: Corky Crafts, MD;  Location: [facility] OR;  Service: Cardiovascular;  Laterality: N/A;  . Tee without cardioversion  12/21/2011    Procedure: TRANSESOPHAGEAL ECHOCARDIOGRAM (TEE);  Surgeon: Corky Crafts, MD;  Location: [facility] ENDOSCOPY;  Service: Cardiovascular;  Laterality: N/A;  . No past surgeries    . Tricuspid valve replacement  02/15/2012    Procedure: TRICUSPID  VALVE REPAIR;  Surgeon: Purcell Nails, MD;  Location: [facility] OR;  Service: Open Heart Surgery;  Laterality: N/A;  . Maze  02/15/2012    Procedure: MAZE;  Surgeon: Purcell Nails, MD;  Location: [facility] OR;  Service: Open Heart Surgery;  Laterality: N/A;  . Intraoperative transesophageal echocardiogram  02/15/2012    Procedure: INTRAOPERATIVE TRANSESOPHAGEAL ECHOCARDIOGRAM;  Surgeon: Purcell Nails, MD;  Location: [facility] OR;  Service: Open Heart Surgery;  Laterality: N/A;  . Mitral valve replacement  02/15/2012    Procedure: MITRAL VALVE (MV) REPLACEMENT;  Surgeon: Purcell Nails, MD;  Location: [facility] OR;  Service: Open Heart Surgery;  Laterality: N/A;   HPI:    [name] is a 56 y.o. 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 Probably last time she was seen normal was at 8: 50. She was found by her son sitting in the couch, weak, unable to speak. Patient was initially a code stroke, patient was not candidate for TPA due to elevated INR at 1.8. During my evaluation patient has expressive and dysarthric aphasia. She thought that her daughter was her sister. MRI: Scattered left hemispheric/middle cerebral artery distribution, infarcts involving portions of the left insula/peripercular region and left frontal lobe.  Objective assessment of MBS indicated due to  results of BSE to assess risk for aspiration.      Assessment / Plan / Recommendation Clinical Impression  Dysphagia Diagnosis: Mild oral phase dysphagia;Mild pharyngeal phase dysphagia;Moderate cervical esophageal phase dysphagia Mild sensory motor oral dysphagia marked by decreased lingual strength and coordination resulting in delay in oral transit, oral holding, and piecemeal swallows.   Mild pharyngeal dysphagia with deep penetration to cords initial swallow of thin liquid barium by cup  due to incomplete airway closure.  Patient sensed penetration with subsequent effective throat clear.   Pharyngeal swallow improved as evaluation proceeded as only one instance of penetration occurring out of multiple trials of thin.  Minimal pharyngeal residue at CP segment s/p swallow of puree, thin and solid trials.  Brief esophageal screen indicates stasis in thoracic esophagus with backflow of puree and thin liquids to cervical esophagus.  Incomplete transit with whole barium tablet with stasis in thoracic esophagus.  Cued dry swallows and sips of thin liquid ineffective in completing transit.  Recommend to proceed with Dysphagia 3 diet consistency with thin liquids with full supervision due to cognitive status.  Recommend modified diet mainly due to oral phase dysphagia.  ST to follow in acute care setting for diet tolerance and possible advancement.  No repeat MBS warranted at this time for diet advancement.  Patient may benefit from GI consult to assess esophageal functioning.     Treatment Recommendation  Therapy as outlined in treatment plan below    Diet Recommendation Dysphagia 3 (Mechanical Soft);Thin liquid   Liquid Administration via: Cup;Straw Medication Administration: Crushed with puree Supervision: Patient able to self feed;Full supervision/cueing for compensatory strategies Compensations: Slow rate;Small sips/bites;Check for pocketing;Multiple dry swallows after each bite/sip Postural Changes and/or Swallow Maneuvers: Out of bed for meals;Seated upright 90 degrees;Upright 30-60 min after meal    Other  Recommendations Recommended Consults: Consider GI evaluation Oral Care Recommendations: Oral care QID   Follow Up Recommendations  Outpatient SLP    Frequency and Duration min 2x/week  2 weeks       SLP Swallow Goals Patient will utilize recommended strategies during swallow to increase swallowing safety with: Modified independent assistance   General Date of Onset:  04/14/12 Type of Study: Bedside swallow evaluation Reason for Referral: Objectively evaluate swallowing function Diet Prior to this Study: NPO Temperature Spikes Noted: No Respiratory Status: Room air History of Recent Intubation: No Behavior/Cognition: Alert;Cooperative;Pleasant mood;Requires cueing Oral Cavity - Dentition: Adequate natural dentition Oral Motor / Sensory Function: Impaired - see Bedside swallow eval Self-Feeding Abilities: Needs assist;Able to feed self Patient Positioning: Upright in chair Baseline Vocal Quality: Hoarse;Low vocal intensity Volitional Cough: Strong Volitional Swallow: Able to elicit Anatomy: Within functional limits Pharyngeal Secretions: Not observed secondary MBS    Reason for Referral Objectively evaluate swallowing function   Oral Phase Oral Preparation/Oral Phase Oral Phase: Impaired Oral - Nectar Oral - Nectar Cup: Weak lingual manipulation;Holding of bolus;Piecemeal swallowing;Lingual/palatal residue;Delayed oral transit Oral - Thin Oral - Thin Cup: Weak lingual manipulation;Delayed oral transit;Lingual/palatal residue;Piecemeal swallowing;Holding of bolus;Reduced posterior propulsion Oral - Solids Oral - Puree: Weak lingual manipulation;Reduced posterior propulsion;Holding of bolus;Piecemeal swallowing;Lingual/palatal residue;Delayed oral transit Oral - Regular: Impaired mastication;Weak lingual manipulation;Incomplete tongue to palate contact;Holding of bolus;Piecemeal swallowing;Lingual/palatal residue;Delayed oral transit Oral - Pill: Weak lingual manipulation;Holding of bolus;Delayed oral transit   Pharyngeal Phase Pharyngeal Phase Pharyngeal Phase: Impaired Pharyngeal - Nectar Pharyngeal - Nectar Cup: Premature spillage to valleculae;Reduced pharyngeal peristalsis;Reduced airway/laryngeal closure;Reduced tongue base retraction;Pharyngeal residue - cp segment Pharyngeal - Thin Pharyngeal -  Thin Cup: Premature spillage to  valleculae;Reduced airway/laryngeal closure;Penetration/Aspiration before swallow;Penetration/Aspiration during swallow;Trace aspiration;Pharyngeal residue - cp segment Penetration/Aspiration details (thin cup): Material enters airway, CONTACTS cords then ejected out Pharyngeal - Thin Straw: Premature spillage to valleculae;Reduced pharyngeal peristalsis;Reduced tongue base retraction;Reduced airway/laryngeal closure Pharyngeal - Solids Pharyngeal - Puree: Premature spillage to valleculae;Reduced tongue base retraction;Pharyngeal residue - cp segment;Pharyngeal residue - pyriform sinuses Pharyngeal - Regular: Premature spillage to valleculae;Reduced tongue base retraction;Pharyngeal residue - cp segment Pharyngeal - Pill: Delayed swallow initiation  Cervical Esophageal Phase    GO    Cervical Esophageal Phase Cervical Esophageal Phase: Impaired Cervical Esophageal Phase - Thin Thin Cup: Esophageal backflow into cervical esophagus;Prominent cricopharyngeal segment;Reduced cricopharyngeal relaxation Cervical Esophageal Phase - Solids Regular: Reduced cricopharyngeal relaxation;Prominent cricopharyngeal segment;Esophageal backflow into cervical esophagus Pill: Esophageal backflow into cervical esophagus;Reduced cricopharyngeal relaxation;Prominent cricopharyngeal segment        Moreen Fowler MS, CCC-SLP [phone] [facility] 04/15/2012, 2:32 PM

## 2012-04-15 NOTE — Progress Notes (Signed)
  Echocardiogram 2D Echocardiogram has been performed.  Niyonna Betsill FRANCES 04/15/2012, 5:28 PM

## 2012-04-15 NOTE — Progress Notes (Signed)
ANTICOAGULATION CONSULT NOTE - Follow-up Consult  Pharmacy Consult for UFH Indication: h/o afib/MVR  No Known Allergies  Patient Measurements: Height: 5\' 6"  (167.6 cm) Weight: 216 lb 14.9 oz (98.4 kg) IBW/kg (Calculated) : 59.3 Heparin Dosing Weight: 81kg   Vital Signs: Temp: 97.6 F (36.4 C) (04/06 0425) Temp src: Oral (04/06 0425) BP: 163/103 mmHg (04/06 0425) Pulse Rate: 113 (04/06 0425)  Labs:  Recent Labs  04/14/12 1225 04/14/12 1240 04/14/12 2213 04/15/12 0530  HGB 11.3* 12.6  --  11.0*  HCT 34.5* 37.0  --  33.7*  PLT 195  --   --  210  APTT 43*  --   --   --   LABPROT 20.6*  --   --   --   INR 1.84*  --   --   --   HEPARINUNFRC  --   --  0.16* 0.24*  CREATININE 0.76 0.80  --   --   TROPONINI <0.30  --   --   --     Estimated Creatinine Clearance: 91.7 ml/min (by C-G formula based on Cr of 0.8).   Medical History: Past Medical History  Diagnosis Date  . Asthma   . Hypertension   . Obesity   . Chronic diastolic congestive heart failure   . Carotid bruit   . Hypercholesterolemia   . Polyp of rectum   . Abnormal chest CT   . Persistent atrial fibrillation   . Bronchospasm 1998  . Atrial enlargement, left     severe  . Heart murmur   . Sleep apnea     DOES NOT HAVE CPAP  . Diabetes mellitus     insulin dependent  . Mitral regurgitation   . Tricuspid regurgitation 01/16/2012  . Rheumatic fever 01/16/2012    Reported during childhood  . Atrial fibrillation 01/05/2011    Chronic persistent, failed DCCV   . Obesity (BMI 30-39.9) 01/16/2012  . Shortness of breath     with exertion  . History of blood transfusion   . 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 S/P Maze operation for atrial fibrillation 02/15/2012    Complete biatrial lesion set using bipolar radiofrequency and cryothermy ablation with  clipping of LA appendage    Medications:  Prescriptions prior to admission  Medication Sig Dispense Refill  . albuterol (PROVENTIL HFA;VENTOLIN HFA) 108 (90 BASE) MCG/ACT inhaler Inhale 2 puffs into the lungs every 6 (six) hours as needed. For wheezing      . amiodarone (PACERONE) 200 MG tablet Take 200 mg by mouth daily.      . calcium carbonate (OS-CAL - DOSED IN MG OF ELEMENTAL CALCIUM) 1250 MG tablet Take 1 tablet by mouth daily.        . Cholecalciferol (VITAMIN D) 1000 UNITS capsule Take 1,000 Units by mouth daily.       [name] DIOVAN 320 MG tablet Take 320 mg by mouth daily.       . furosemide (LASIX) 40 MG tablet Take 1 tablet (40 mg total) by mouth 2 (two) times daily.  60 tablet  11  . insulin glargine (LANTUS SOLOSTAR) 100 UNIT/ML injection Inject 30-35 Units into the skin See admin instructions. Inject 30 units once daily and inject 35 units daily at bedtime.      [name] LIPITOR 40 MG tablet Take 40 mg by  mouth every evening. Once daily      . metoprolol tartrate (LOPRESSOR) 25 MG tablet Take 1 tablet (25 mg total) by mouth 2 (two) times daily.  60 tablet  1  . Omega-3 Fatty Acids (FISH OIL) 1000 MG CAPS Take 1 capsule by mouth daily.       [name] oxyCODONE (OXY IR/ROXICODONE) 5 MG immediate release tablet Take 5-10 mg by mouth every 4 (four) hours as needed. For pain      . potassium chloride SA (K-DUR,KLOR-CON) 20 MEQ tablet Take 1 tablet (20 mEq total) by mouth daily.  30 tablet  11  . sitaGLIPtan-metformin (JANUMET) 50-1000 MG per tablet Take 1 tablet by mouth daily.      [name] warfarin (COUMADIN) 4 MG tablet Take by mouth See admin instructions. 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 INR currently subtherapeutic, on heparin bridge, no bolus.  Heparin level (0.16) is below-goal on  1400 units/hr. No problem with line / infusion and no bleeding per RN.   Goal of Therapy:  Heparin level 0.3-0.5 units/ml Monitor platelets by anticoagulation protocol: Yes   Plan:  1. Increase IV heparin to 1600 units/hr.  2. Heparin level in 6 hours.   Lorre Munroe, PharmD 04/15/2012,6:33 AM

## 2012-04-15 NOTE — Progress Notes (Signed)
Called with report from MRI head by Dr. Constance Goltz.  Results paged to Dr. Sharon Seller.  Roselie Awkward, RN

## 2012-04-15 NOTE — Progress Notes (Signed)
Primary Cardiologist:  Dr Eldridge Dace  Pt is presently in MRI and I have therefore not examined her today.  Telemetry reveals atypical atrial flutter with LBBB.  V rates remain elevated.  She is on a heparin drip and awaiting clearance to take POs before restarting coumadin.  Echo is pending. For now, continue current regimen.  Will more aggressively control heart rates once taking POs.  I dont think that she requires IV cardizem gtt at this time.  Dr Eldridge Dace to follow.

## 2012-04-15 NOTE — Evaluation (Signed)
Physical Therapy Evaluation Patient Details Name: [name] MRN: [id number] DOB: November 24, 1955 Today's Date: 04/15/2012 Time: 0810-0829 PT Time Calculation (min): 19 min  PT Assessment / Plan / Recommendation Clinical Impression  pt presents with CVA.  pt demos R sided deficits with decreased strength and coordination.  Feel pt would benefit from SLP for cognitive eval.  pt would benefit from OPPT.      PT Assessment  Patient needs continued PT services    Follow Up Recommendations  Outpatient PT    Does the patient have the potential to tolerate intense rehabilitation      Barriers to Discharge None      Equipment Recommendations  None recommended by PT    Recommendations for Other Services Speech consult   Frequency Min 4X/week    Precautions / Restrictions Precautions Precautions: Fall Restrictions Weight Bearing Restrictions: No   Pertinent Vitals/Pain Denies pain.        Mobility  Bed Mobility Bed Mobility: Not assessed Transfers Transfers: Sit to Stand;Stand to Sit Sit to Stand: 5: Supervision;With upper extremity assist;From bed Stand to Sit: 5: Supervision;With upper extremity assist;To chair/3-in-1;With armrests Details for Transfer Assistance: demos good safety, but favors R UE.   Ambulation/Gait Ambulation/Gait Assistance: 5: Supervision Ambulation Distance (Feet): 180 Feet Assistive device: None Ambulation/Gait Assistance Details: pt moves slowly with slight decreased DF on R LE.  pt able to find her room after ambulating, but difficulty with room number.   Gait Pattern: Step-through pattern;Decreased stride length;Decreased dorsiflexion - right Stairs: No Wheelchair Mobility Wheelchair Mobility: No Modified Rankin (Stroke Patients Only) Pre-Morbid Rankin Score: No symptoms Modified Rankin: Moderate disability    Exercises     PT Diagnosis: Difficulty walking  PT Problem List: Decreased strength;Decreased activity tolerance;Decreased  balance;Decreased mobility;Decreased coordination;Decreased cognition;Decreased knowledge of use of DME PT Treatment Interventions: DME instruction;Gait training;Stair training;Functional mobility training;Therapeutic exercise;Therapeutic activities;Balance training;Neuromuscular re-education;Cognitive remediation;Patient/family education   PT Goals Acute Rehab PT Goals PT Goal Formulation: With patient Time For Goal Achievement: 04/29/12 Potential to Achieve Goals: Good Pt will go Supine/Side to Sit: with modified independence PT Goal: Supine/Side to Sit - Progress: Goal set today Pt will go Sit to Supine/Side: with modified independence PT Goal: Sit to Supine/Side - Progress: Goal set today Pt will go Sit to Stand: with modified independence PT Goal: Sit to Stand - Progress: Goal set today Pt will go Stand to Sit: with modified independence PT Goal: Stand to Sit - Progress: Goal set today Pt will Ambulate: >150 feet;with modified independence PT Goal: Ambulate - Progress: Goal set today Pt will Go Up / Down Stairs: 3-5 stairs;with modified independence PT Goal: Up/Down Stairs - Progress: Goal set today  Visit Information  Last PT Received On: 04/15/12 Assistance Needed: +1    Subjective Data  Subjective: I feel better.   Patient Stated Goal: Home   Prior Functioning  Home Living Lives With: Family Available Help at Discharge: Family;Available 24 hours/day Type of Home: House Home Access: Stairs to enter [employer] of Steps: 2 Entrance Stairs-Rails: None Home Layout: One level Home Adaptive Equipment: None Prior Function Level of Independence: Independent Able to Take Stairs?: Yes Driving: Yes Communication Communication: Expressive difficulties (A little slurred.  )    Cognition  Cognition Overall Cognitive Status: Difficult to assess Difficult to assess due to: Impaired communication Arousal/Alertness: Awake/alert Orientation Level: Appears intact for  tasks assessed Behavior During Session: [facility] for tasks performed Cognition - Other Comments: pt appears to have intact cognition, however initially  stated the year was 1984, but as soon as she said it tried to correct herself and needed increased time to say 2014.  ? if this is more due to speech deficit.  pt able to find room while out ambulating, but unable to state room number until able to read number on sign.      Extremity/Trunk Assessment Right Lower Extremity Assessment RLE ROM/Strength/Tone: Deficits RLE ROM/Strength/Tone Deficits: AROM WFL, strength grossly 4/5 RLE Sensation: WFL - Light Touch RLE Coordination: Deficits RLE Coordination Deficits: Decreased coordination with toe tapping and heel to shin.   Left Lower Extremity Assessment LLE ROM/Strength/Tone: WFL for tasks assessed LLE Sensation: WFL - Light Touch Trunk Assessment Trunk Assessment: Normal   Balance Balance Balance Assessed: Yes High Level Balance High Level Balance Activites: Turns;Head turns (Stepping over obstacles)  End of Session PT - End of Session Equipment Utilized During Treatment: Gait belt Activity Tolerance: Patient tolerated treatment well Patient left: in chair;with call bell/phone within reach Nurse Communication: Mobility status  GP     Sunny Schlein, [place] [phone] 04/15/2012, 9:15 AM

## 2012-04-15 NOTE — Progress Notes (Addendum)
ANTICOAGULATION CONSULT NOTE - Follow-up Consult  Pharmacy Consult for UFH Indication: h/o afib/MVR  No Known Allergies  Patient Measurements: Height: 5\' 6"  (167.6 cm) Weight: 216 lb 14.9 oz (98.4 kg) IBW/kg (Calculated) : 59.3 Heparin Dosing Weight: 81kg   Vital Signs: Temp: 98.5 F (36.9 C) (04/06 1200) Temp src: Oral (04/06 1200) BP: 148/102 mmHg (04/06 1200) Pulse Rate: 117 (04/06 1200)  Labs:  Recent Labs  04/14/12 1225 04/14/12 1240 04/14/12 2213 04/15/12 0530 04/15/12 1245  HGB 11.3* 12.6  --  11.0*  --   HCT 34.5* 37.0  --  33.7*  --   PLT 195  --   --  210  --   APTT 43*  --   --   --   --   LABPROT 20.6*  --   --   --   --   INR 1.84*  --   --   --   --   HEPARINUNFRC  --   --  0.16* 0.24* 0.63  CREATININE 0.76 0.80  --   --   --   TROPONINI <0.30  --   --   --   --     Estimated Creatinine Clearance: 91.7 ml/min (by C-G formula based on Cr of 0.8).   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 INR currently subtherapeutic, on heparin bridge, no bolus.  Heparin level (0.63) is at goal on 1600 units/hr.   Goal of Therapy:  Heparin level 0.3-0.5 units/ml Monitor platelets by anticoagulation protocol: Yes   Plan:  -Decrease heparin to 1500 units/hr -Recheck a level in 6 hrs  Harland German, Pharm D 04/15/2012 1:27 PM    Ok to resume coumadin as taking po. Home dose is 6mg  daily except 4mg  on Saturday. Patient admitted with subtherapeutic INR with heparin bridge. Will give 1.5x pta dose, 8mg  today and f/u daily protime.  Verlene Mayer, PharmD, BCPS Pager [phone]

## 2012-04-15 NOTE — Evaluation (Signed)
Clinical/Bedside Swallow Evaluation Patient Details  Name: [name] MRN: [id number] Date of Birth: 1955-07-14  Today's Date: 04/15/2012 Time: 1478-2956 SLP Time Calculation (min): 15 min  Past Medical History:  Past Medical History  Diagnosis Date  . Asthma   . Hypertension   . Obesity   . Chronic diastolic congestive heart failure   . Carotid bruit   . Hypercholesterolemia   . Polyp of rectum   . Abnormal chest CT   . Persistent atrial fibrillation   . Bronchospasm 1998  . Atrial enlargement, left     severe  . Heart murmur   . Sleep apnea     DOES NOT HAVE CPAP  . Diabetes mellitus     insulin dependent  . Mitral regurgitation   . Tricuspid regurgitation 01/16/2012  . Rheumatic fever 01/16/2012    Reported during childhood  . Atrial fibrillation 01/05/2011    Chronic persistent, failed DCCV   . Obesity (BMI 30-39.9) 01/16/2012  . Shortness of breath     with exertion  . History of blood transfusion   . 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 S/P Maze operation for atrial fibrillation 02/15/2012    Complete biatrial lesion set using bipolar radiofrequency and cryothermy ablation with clipping of LA appendage   Past Surgical History:  Past Surgical History  Procedure Laterality Date  . Cardiovascular stress test  09/2010  . Cardiac catheterization  >5 years  . Cardioversion  03/25/2011    Procedure: CARDIOVERSION;  Surgeon: Corky Crafts, MD;  Location: [facility] OR;  Service: Cardiovascular;  Laterality: N/A;  . Tee without cardioversion  12/21/2011    Procedure: TRANSESOPHAGEAL ECHOCARDIOGRAM (TEE);  Surgeon: Corky Crafts, MD;  Location: [facility] ENDOSCOPY;  Service: Cardiovascular;  Laterality: N/A;  . No past surgeries    . Tricuspid valve replacement  02/15/2012    Procedure: TRICUSPID VALVE REPAIR;  Surgeon:  Purcell Nails, MD;  Location: [facility] OR;  Service: Open Heart Surgery;  Laterality: N/A;  . Maze  02/15/2012    Procedure: MAZE;  Surgeon: Purcell Nails, MD;  Location: [facility] OR;  Service: Open Heart Surgery;  Laterality: N/A;  . Intraoperative transesophageal echocardiogram  02/15/2012    Procedure: INTRAOPERATIVE TRANSESOPHAGEAL ECHOCARDIOGRAM;  Surgeon: Purcell Nails, MD;  Location: [facility] OR;  Service: Open Heart Surgery;  Laterality: N/A;  . Mitral valve replacement  02/15/2012    Procedure: MITRAL VALVE (MV) REPLACEMENT;  Surgeon: Purcell Nails, MD;  Location: [facility] OR;  Service: Open Heart Surgery;  Laterality: N/A;   HPI:    [name] is a 57 y.o. 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 Probably last time she was seen normal was at 8: 50. She was found by her son sitting in the couch, weak, unable to speak. Patient was initially a code stroke, patient was not candidate for TPA due to elevated INR at 1.8. During my evaluation patient has expressive and dysarthric aphasia. She thought that her daughter was her sister. She is following some command. She denies chest pain and SOB. MRI pending.  Patient referred for BSE per Stroke protocol.      Assessment / Plan / Recommendation  Clinical Impression  Moderate oropharyngeal dysphagia marked by weakness and decreased sensory.   Delay in initiation with reduced hyoid laryngeal elevation resulting in throat clears s/p swallow of all consistencies indicating possible penetration from residue.  Recommend to proceed with objective evaluation of MBS to assess risk for aspiration and determine safest, PO diet with necessary swallow strategies.  MBS to be completed this am.      Aspiration Risk  Moderate    Diet Recommendation NPO        Other  Recommendations Recommended Consults:  MBS   Follow Up Recommendations  Outpatient SLP    Frequency and Duration min 2x/week  2 weeks   Pertinent Vitals/Pain RR fluctuated to 20's to 30 during PO trials.     SLP Swallow Goals  Pending results of MBS    Swallow Study Prior Functional Status  Type of Home: House Lives With: Family Available Help at Discharge: Family;Available 24 hours/day    General Date of Onset: 04/14/12 Type of Study: Bedside swallow evaluation Diet Prior to this Study: NPO Temperature Spikes Noted: No Respiratory Status: Room air History of Recent Intubation: No Behavior/Cognition: Alert;Cooperative;Pleasant mood;Requires cueing Oral Cavity - Dentition: Adequate natural dentition Self-Feeding Abilities: Able to feed self;Needs assist Patient Positioning: Upright in chair Baseline Vocal Quality: Hoarse;Low vocal intensity Volitional Cough: Weak Volitional Swallow: Able to elicit    Oral/Motor/Sensory Function Overall Oral Motor/Sensory Function: Impaired Labial ROM: Reduced right Labial Symmetry: Abnormal symmetry right Labial Strength: Reduced Labial Sensation: Reduced Lingual ROM: Reduced right Lingual Symmetry: Abnormal symmetry right Lingual Strength: Reduced Lingual Sensation: Reduced Facial ROM: Reduced right Facial Symmetry: Right droop Facial Strength: Reduced Facial Sensation: Reduced Velum: Within Functional Limits Mandible: Within Functional Limits   Ice Chips Ice chips: Impaired Presentation: Self Fed Pharyngeal Phase Impairments: Decreased hyoid-laryngeal movement;Throat Clearing - Delayed   Thin Liquid Thin Liquid: Impaired Presentation: Cup;Spoon Pharyngeal  Phase Impairments: Suspected delayed Swallow;Decreased hyoid-laryngeal movement;Multiple swallows;Change in Vital Signs    Nectar Thick Nectar Thick Liquid: Not tested   Honey Thick Honey Thick Liquid: Not tested   Puree Puree: Impaired Presentation: Self Fed;Spoon Oral Phase Impairments: Reduced labial  seal;Reduced lingual movement/coordination Oral Phase Functional Implications: Oral holding Pharyngeal Phase Impairments: Suspected delayed Swallow;Decreased hyoid-laryngeal movement;Multiple swallows;Throat Clearing - Delayed   Solid   GO    Solid: Impaired Presentation: Self Fed Oral Phase Impairments: Reduced labial seal;Reduced lingual movement/coordination Oral Phase Functional Implications: Right anterior spillage;Oral residue Pharyngeal Phase Impairments: Suspected delayed Swallow;Decreased hyoid-laryngeal movement;Multiple swallows;Throat Clearing - Delayed      Moreen Fowler MS, CCC-SLP [phone] [facility] 04/15/2012,10:08 AM

## 2012-04-15 NOTE — ED Provider Notes (Signed)
Medical screening examination/treatment/procedure(s) were conducted as a shared visit with non-physician practitioner(s) and myself.  I personally evaluated the patient during the encounter  [name], MD 04/15/12 0657 

## 2012-04-15 NOTE — Progress Notes (Signed)
[facility] Progress Note [name] TEAM 1 - Stepdown/ICU TEAM   [name] ZOX:[id number] DOB: 03-18-1955 DOA: 04/14/2012 PCP: Hulda Humphrey, NP  Brief narrative: 58 y.o. 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 She was found by her son sitting on the couch weak and unable to speak. Patient was initially a code stroke but was not a candidate for TPA due to elevated INR at 1.8. During the initial evaluation the patient had expressive aphasia. She thought her daughter was her sister. She was following some simple commands. She denied chest pain and SOB.   Assessment/Plan:  Acute CVA - Scattered left hemispheric/middle cerebral artery distribution infarcts involving left insula/periopercular region and left frontal lobe Stroke Team following - w/u underway - INR was not therapeutic at the time of admission - currently covering with IV heparin - cleared for diet so will resume coumadin - begin PT/OT/SLP tx - carotid dopplers pending, as is echo  Hypertension Permissive HTN at this time - follow trend   Diabetes mellitus Poorly controlled w/ HbA1c of 8.7 - CBG well controlled as inpatient at this time   Hx of Chronic Atrial fibrillation / atypical flutter Care as per Cardiology - S/p MAZE at time of valve surgery   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  Consultants: Stroke team Cardiology  Procedures: 4/6 - TTE - results pending 4/6 - carotid Dopplers - results  pending  Antibiotics: None  DVT prophylaxis: IV heparin  HPI/Subjective: The patient is awake alert and oriented.  Her speech is slurred but is reportedly significantly improved since presentation.  She continues to report significant weakness in her right arm and leg.  She denies chest pain fevers chills nausea or vomiting.  Objective: Blood pressure 148/102, pulse 117, temperature 98.5 F (36.9 C), temperature source Oral, resp. rate 28, height 5\' 6"  (1.676 m), weight 98.4 kg (216 lb 14.9 oz), SpO2 100.00%.  Intake/Output Summary (Last 24 hours) at 04/15/12 1542 Last data filed at 04/15/12 1200  Gross per 24 hour  Intake 1176.31 ml  Output    300 ml  Net 876.31 ml   Exam: General: No acute respiratory distress Lungs: Clear to auscultation bilaterally without wheezes or crackles Cardiovascular: Regular rate with metallic systolic click Abdomen: Nontender, nondistended, soft, bowel sounds positive, no rebound, no ascites, no appreciable mass Extremities: No significant cyanosis, clubbing, or edema bilateral lower extremities Neuro:  Alert and oriented x4, tongue deviates to the right, appreciable loss of strength in right arm and leg, some slurring of speech but speech is intelligible  Data Reviewed: Basic Metabolic Panel:  Recent Labs Lab 04/14/12 1225 04/14/12 1240  NA 137 143  K 3.2* 3.4*  CL 102 104  CO2 25  --   GLUCOSE 147* 152*  BUN 10 9  CREATININE 0.76 0.80  CALCIUM 9.3  --    Liver Function Tests:  Recent Labs Lab 04/14/12 1225  AST 27  ALT 20  ALKPHOS 123*  BILITOT 0.7  PROT 8.2  ALBUMIN 3.6   CBC:  Recent Labs Lab 04/14/12 1225 04/14/12 1240 04/15/12 0530  WBC 5.9  --  6.0  NEUTROABS 3.7  --   --   HGB 11.3* 12.6 11.0*  HCT 34.5* 37.0 33.7*  MCV 87.3  --  87.8  PLT 195  --  210   Cardiac Enzymes:  Recent Labs Lab 04/14/12 1225  TROPONINI <0.30   BNP (last 3 results)  Recent Labs  12/19/11 0431 12/19/11 1145 02/24/12 0600   PROBNP 1004.0* 1138.0* 1163.0*   CBG:  Recent Labs Lab 04/14/12 1251 04/14/12 1707 04/14/12 2148 04/15/12 0827 04/15/12 1158  GLUCAP 145* 98 87 94 82    Recent Results (from the past 240 hour(s))  MRSA PCR SCREENING     Status: None   Collection Time    04/14/12  5:40 PM      Result Value Range Status   MRSA by PCR NEGATIVE  NEGATIVE Final   Comment:            The GeneXpert MRSA Assay (FDA     approved for NASAL specimens     only), is one component of a     comprehensive MRSA colonization     surveillance program. It is not     intended to diagnose MRSA     infection nor to guide or     monitor treatment for     MRSA infections.     Studies:  Recent x-ray studies have been reviewed in detail by the Attending Physician  Scheduled Meds:  Scheduled Meds: . atorvastatin  80 mg Oral QPM  . insulin aspart  0-9 Units Subcutaneous TID WC  . metoprolol tartrate  25 mg Oral BID   Continuous Infusions: . sodium chloride 40 mL/hr at 04/15/12 1200  . heparin 1,500 Units/hr (04/15/12 1357)    Time spent on care of this patient:   [facility]  [facility] Office  [phone] Pager - Text Page per Loretha Stapler as per below:  On-Call/Text Page:      [email or web address]      password [email or web address]  If 7PM-7AM, please contact night-coverage [email or web address] Password [email or web address] 04/15/2012, 3:42 PM   LOS: 1 day

## 2012-04-15 NOTE — Progress Notes (Signed)
ANTICOAGULATION CONSULT NOTE - Follow-up Consult  Pharmacy Consult for UFH Indication: h/o afib/MVR  No Known Allergies  Patient Measurements: Height: 5\' 6"  (167.6 cm) Weight: 216 lb 14.9 oz (98.4 kg) IBW/kg (Calculated) : 59.3 Heparin Dosing Weight: 81kg   Vital Signs: Temp: 97.3 F (36.3 C) (04/06 1936) Temp src: Oral (04/06 1936) BP: 144/100 mmHg (04/06 1936) Pulse Rate: 110 (04/06 1600)  Labs:  Recent Labs  04/14/12 1225 04/14/12 1240  04/15/12 0530 04/15/12 1245 04/15/12 2003  HGB 11.3* 12.6  --  11.0*  --   --   HCT 34.5* 37.0  --  33.7*  --   --   PLT 195  --   --  210  --   --   APTT 43*  --   --   --   --   --   LABPROT 20.6*  --   --   --   --   --   INR 1.84*  --   --   --   --   --   HEPARINUNFRC  --   --   < > 0.24* 0.63 0.55  CREATININE 0.76 0.80  --   --   --   --   TROPONINI <0.30  --   --   --   --   --   < > = values in this interval not displayed.  Estimated Creatinine Clearance: 91.7 ml/min (by C-G formula based on Cr of 0.8).   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 INR currently subtherapeutic, on heparin bridge.  Xa slightly above goal and trending down, will continue at current rate.  Goal of Therapy:  Heparin level 0.3-0.5 units/ml Monitor platelets by anticoagulation protocol: Yes   Plan:  Continue gtt at 1500 units/hr and f/u in am.  Verlene Mayer, PharmD, BCPS Pager [phone]   04/15/2012 8:59 PM

## 2012-04-15 NOTE — Progress Notes (Signed)
Stroke Team Progress Note  HISTORY [name] is an 57 y.o. 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 Patient was last known well at 9 AM today. Has no previous history of stroke nor TIA. Patient has been on Coumadin for anticoagulation. INR was 1.84 today. As such she was not deemed a candidate for intravenous thrombolytic therapy with TPA. NIH stroke score was 8.  LSN: 9 AM on 04/14/2012  tPA Given: No: INR 1.84  MRankin: 2  . She was admitted to the neuro stepdown unit for further evaluation and treatment.  SUBJECTIVE Her husband is at the bedside.  Overall she feels her condition is rapidly improving. She still has some word dysfluency and hand weakness. She has been started on iv heparin bridging for suboptimal INR  OBJECTIVE Most recent Vital Signs: Filed Vitals:   04/14/12 2300 04/14/12 2323 04/15/12 0425 04/15/12 0800  BP: 139/102 139/102 163/103 156/108  Pulse:  114 113 116  Temp: 97.9 F (36.6 C) 97.9 F (36.6 C) 97.6 F (36.4 C) 98.4 F (36.9 C)  TempSrc: Oral Oral Oral Oral  Resp:  17 25 28   Height:      Weight:      SpO2:  100% 97% 98%   CBG (last 3)   Recent Labs  04/14/12 1251  GLUCAP 145*    IV Fluid Intake:   . sodium chloride 40 mL/hr at 04/15/12 0800  . heparin 1,600 Units/hr (04/15/12 0800)    MEDICATIONS  . atorvastatin  40 mg Oral QPM  . insulin aspart  0-9 Units Subcutaneous TID WC  . metoprolol tartrate  25 mg Oral BID   PRN:  albuterol, hydrALAZINE, metoprolol, senna-docusate  Diet:  NPO   Activity:  Up with assistance  DVT Prophylaxis:  Iv heparin  CLINICALLY SIGNIFICANT STUDIES Basic Metabolic Panel:  Recent Labs Lab 04/14/12 1225 04/14/12 1240  NA 137 143  K 3.2* 3.4*  CL 102 104  CO2 25  --   GLUCOSE 147* 152*  BUN 10 9   CREATININE 0.76 0.80  CALCIUM 9.3  --    Liver Function Tests:  Recent Labs Lab 04/14/12 1225  AST 27  ALT 20  ALKPHOS 123*  BILITOT 0.7  PROT 8.2  ALBUMIN 3.6   CBC:  Recent Labs Lab 04/14/12 1225 04/14/12 1240 04/15/12 0530  WBC 5.9  --  6.0  NEUTROABS 3.7  --   --   HGB 11.3* 12.6 11.0*  HCT 34.5* 37.0 33.7*  MCV 87.3  --  87.8  PLT 195  --  210   Coagulation:  Recent Labs Lab 04/14/12 1225  LABPROT 20.6*  INR 1.84*   Cardiac Enzymes:  Recent Labs Lab 04/14/12 1225  TROPONINI <0.30   Urinalysis: No results found for this basename: COLORURINE, APPERANCEUR, LABSPEC, PHURINE, GLUCOSEU, HGBUR, BILIRUBINUR, KETONESUR, PROTEINUR, UROBILINOGEN, NITRITE, LEUKOCYTESUR,  in the last 168 hours Lipid Panel    Component Value Date/Time   CHOL 169 04/15/2012 0530   TRIG 78 04/15/2012 0530   HDL 27* 04/15/2012 0530   CHOLHDL 6.3 04/15/2012 0530   VLDL 16 04/15/2012 0530   LDLCALC 126* 04/15/2012 0530   HgbA1C  Lab Results  Component Value Date   HGBA1C 8.7* 02/13/2012    Urine Drug Screen:   No results found for this basename: labopia, cocainscrnur, labbenz, amphetmu, thcu, labbarb  Alcohol Level: No results found for this basename: ETH,  in the last 168 hours  Dg Chest 2 View  04/15/2012  *RADIOLOGY REPORT*  Clinical Data: History of stroke.  CHEST - 2 VIEW  Comparison: 03/19/2012  Findings: Patient has had median sternotomy and valve replacement. Heart is enlarged.  There is diffuse, interstitial pulmonary edema. No focal consolidations or definite pleural effusions are identified.  IMPRESSION: Cardiomegaly and interstitial edema.   Original Report Authenticated By: Norva Pavlov, M.D.    Ct Head (brain) Wo Contrast  04/14/2012  *RADIOLOGY REPORT*  Clinical Data: Right-sided facial droop, right-sided weakness and lethargy.  CT HEAD WITHOUT CONTRAST  Technique:  Contiguous axial images were obtained from the base of the skull through the vertex without contrast.   Comparison: None.  Findings: The brain demonstrates no evidence of hemorrhage, infarction, edema, mass effect, extra-axial fluid collection, hydrocephalus or mass lesion.  The skull is unremarkable.  IMPRESSION: No acute findings.   Original Report Authenticated By: Irish Lack, M.D.      MRI of the brain  ordered  MRA of the brain  ordered  2D Echocardiogram  ordered  Carotid Doppler  ordered  CXR  ordered  EKG  Sinus tachycardia Left axis deviation Left bundle branch block Therapy Recommendations pending  Physical Exam  Pleasant middle aged african american lady not in distress.Awake alert. Afebrile. Head is nontraumatic. Neck is supple without bruit. Hearing is normal. Cardiac exam no murmur or gallop but mechanical heart sounds heard.. Lungs are clear to auscultation. Distal pulses are well felt. Neurological exam ; Awake  Alert oriented x 3. Mildly nonfluentspeech and language.Good comprehension, naming and mildly impaired repitition..Eye movements full without nystagmus.Fundi were not visualized. Vision acuity and fields appear normal. Hearing is normal. Palatal movements are normal. Face symmetric. Tongue midline. Normal strength, tone, reflexes and coordination. Diminished fine finger movements on right and orbits left over right upper extremity.. Normal sensation. Gait deferred. NIHSS 2 ASSESSMENT [name] is a 57 y.o. female presenting with  Aphasia and right sided weakness likely from left MCA branch embolic infarct from mechanical heart valve, atrial fibrillation and suboptimal anticoagulation  .  On warfarin prior to admission. Now on heparin for secondary stroke prevention. Patient with resultant  Mild expressive aphasia and right hand weakness. Work up underway.    h/o persistent atrial fibrillation, rheumatic heart disease, and recently valve surgery  She has a longstanding history of rheumatic heart disease and atrial fibrillation. She underwent mitral valve  replacement with a Sorin Carbomedics Optiform mechanical prosthesis (size 31mm), tricuspid valve repair- Edwards mc3 ring annuloplasty (size 28mm), and complete biatrial lesion set MAZE using bipolar radiofrequency and cryothermy ablation with clipping of left atrial appendage on 02/15/12     Hospital day # 1  TREATMENT/PLAN  Continue   heparin for now and start warfarin when able to swallow safely for secondary stroke prevention for her mechanical heart valve and atrial fibrillation  Check MRI, Echo, dopplers  Increase dose of statin given LDL 124.aggressive sugar control given HbA1c of 8.7%  Mobilize out of bed as tolerated. PT/OT and ST consults  Delia Heady, MD 04/15/2012 9:00 AM

## 2012-04-16 ENCOUNTER — Encounter (HOSPITAL_COMMUNITY): Payer: Self-pay | Admitting: Interventional Cardiology

## 2012-04-16 DIAGNOSIS — I4892 Unspecified atrial flutter: Secondary | ICD-10-CM | POA: Insufficient documentation

## 2012-04-16 LAB — BASIC METABOLIC PANEL
BUN: 14 mg/dL (ref 6–23)
Creatinine, Ser: 0.79 mg/dL (ref 0.50–1.10)
GFR calc Af Amer: >90 mL/min (ref >90)
GFR calc non Af Amer: >90 mL/min (ref >90)
Potassium: 4.2 mEq/L (ref 3.5–5.1)

## 2012-04-16 LAB — GLUCOSE, CAPILLARY
Glucose-Capillary: 116 mg/dL — ABNORMAL HIGH (ref 70–99)
Glucose-Capillary: 102 mg/dL — ABNORMAL HIGH (ref 70–99)

## 2012-04-16 LAB — CBC
HCT: 33.8 % — ABNORMAL LOW (ref 36.0–46.0)
MCHC: 32.5 g/dL (ref 30.0–36.0)
MCV: 87.8 fL (ref 78.0–100.0)
Platelets: 207 10*3/uL (ref 150–400)
RDW: 14.4 % (ref 11.5–15.5)
WBC: 6.5 10*3/uL (ref 4.0–10.5)

## 2012-04-16 LAB — HEPARIN LEVEL (UNFRACTIONATED)
Heparin Unfractionated: 0.61 IU/mL (ref 0.30–0.70)
Heparin Unfractionated: 0.57 IU/mL (ref 0.30–0.70)

## 2012-04-16 LAB — MAGNESIUM: Magnesium: 2.2 mg/dL (ref 1.5–2.5)

## 2012-04-16 MED ORDER — METOPROLOL TARTRATE 50 MG PO TABS
50.0000 mg | ORAL_TABLET | Freq: Two times a day (BID) | ORAL | Status: DC
  Administered 2012-04-16 – 2012-04-17 (×4): 50 mg via ORAL
  Filled 2012-04-16 (×7): qty 1

## 2012-04-16 MED ORDER — WARFARIN SODIUM 6 MG PO TABS
6.0000 mg | ORAL_TABLET | Freq: Once | ORAL | Status: AC
  Administered 2012-04-16: 6 mg via ORAL
  Filled 2012-04-16 (×2): qty 1

## 2012-04-16 MED ORDER — WARFARIN SODIUM 5 MG PO TABS
5.0000 mg | ORAL_TABLET | Freq: Once | ORAL | Status: DC
  Filled 2012-04-16: qty 1

## 2012-04-16 MED ORDER — ACETAMINOPHEN 325 MG PO TABS
650.0000 mg | ORAL_TABLET | Freq: Four times a day (QID) | ORAL | Status: DC | PRN
  Administered 2012-04-16 – 2012-04-17 (×2): 650 mg via ORAL
  Filled 2012-04-16 (×2): qty 2

## 2012-04-16 MED ORDER — INSULIN GLARGINE 100 UNIT/ML ~~LOC~~ SOLN
10.0000 [IU] | Freq: Every day | SUBCUTANEOUS | Status: DC
  Administered 2012-04-16 – 2012-04-18 (×3): 10 [IU] via SUBCUTANEOUS
  Filled 2012-04-16 (×5): qty 0.1

## 2012-04-16 MED ORDER — HYDRALAZINE HCL 20 MG/ML IJ SOLN: 10.0000 mg | INTRAMUSCULAR | Status: DC | PRN

## 2012-04-16 NOTE — Progress Notes (Signed)
Stroke Team Progress Note  HISTORY [name] is an 57 y.o. 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 Patient was last known well at 9 AM today. Has no previous history of stroke nor TIA. Patient has been on Coumadin for anticoagulation. INR was 1.84 today. As such she was not deemed a candidate for intravenous thrombolytic therapy with TPA. NIH stroke score was 8. She was admitted to the neuro stepdown unit for further evaluation and treatment.  SUBJECTIVE Patient at bedside. No complaints. Feels her language is improving.  OBJECTIVE Most recent Vital Signs: Filed Vitals:   04/15/12 2323 04/16/12 0337 04/16/12 0700 04/16/12 0803  BP: 126/83 129/81 139/99   Pulse:  110    Temp: 97.3 F (36.3 C) 98.1 F (36.7 C)  97.9 F (36.6 C)  TempSrc: Oral Oral  Oral  Resp: 25 28 14    Height:      Weight:      SpO2: 100% 98%     CBG (last 3)   Recent Labs  04/15/12 1158 04/15/12 1638 04/15/12 2207  GLUCAP 82 93 162*   IV Fluid Intake:   . sodium chloride 40 mL/hr at 04/16/12 0600  . heparin 1,500 Units/hr (04/16/12 0600)   MEDICATIONS  . atorvastatin  80 mg Oral QPM  . insulin aspart  0-9 Units Subcutaneous TID WC  . irbesartan  300 mg Oral Daily  . metoprolol tartrate  50 mg Oral BID  . potassium chloride  20 mEq Oral Daily  . Warfarin - Pharmacist Dosing Inpatient   Does not apply q1800   PRN:  hydrALAZINE, metoprolol, senna-docusate  Diet:  Dysphagia  3 thin liquids Activity:  Up with assistance  DVT Prophylaxis:  Iv heparin, coumadin  CLINICALLY SIGNIFICANT STUDIES Basic Metabolic Panel:   Recent Labs Lab 04/14/12 1225 04/14/12 1240 04/16/12 0450  NA 137 143 137  K 3.2* 3.4* 4.2  CL 102 104 106  CO2 25  --  23  GLUCOSE 147* 152* 130*  BUN 10 9 14   CREATININE 0.76  0.80 0.79  CALCIUM 9.3  --  9.4  MG  --   --  2.2   Liver Function Tests:   Recent Labs Lab 04/14/12 1225  AST 27  ALT 20  ALKPHOS 123*  BILITOT 0.7  PROT 8.2  ALBUMIN 3.6   CBC:  Recent Labs Lab 04/14/12 1225  04/15/12 0530 04/16/12 0450  WBC 5.9  --  6.0 6.5  NEUTROABS 3.7  --   --   --   HGB 11.3*  < > 11.0* 11.0*  HCT 34.5*  < > 33.7* 33.8*  MCV 87.3  --  87.8 87.8  PLT 195  --  210 207  < > = values in this interval not displayed. Coagulation:   Recent Labs Lab 04/14/12 1225 04/16/12 0450  LABPROT 20.6* 22.3*  INR 1.84* 2.05*   Cardiac Enzymes:   Recent Labs Lab 04/14/12 1225  TROPONINI <0.30   Urinalysis: No results found for this basename: COLORURINE, APPERANCEUR, LABSPEC, PHURINE, GLUCOSEU, HGBUR, BILIRUBINUR, KETONESUR, PROTEINUR, UROBILINOGEN, NITRITE, LEUKOCYTESUR,  in the last 168 hours Lipid Panel    Component Value Date/Time   CHOL 169 04/15/2012 0530   TRIG 78 04/15/2012 0530   HDL 27* 04/15/2012 0530   CHOLHDL 6.3 04/15/2012 0530   VLDL 16 04/15/2012 0530   LDLCALC 126*  04/15/2012 0530   HgbA1C  Lab Results  Component Value Date   HGBA1C 6.9* 04/15/2012   Urine Drug Screen:   No results found for this basename: labopia,  cocainscrnur,  labbenz,  amphetmu,  thcu,  labbarb    Alcohol Level: No results found for this basename: ETH,  in the last 168 hours  CT Head 04/14/2012 No acute findings.     MRI of the brain  04/15/2012   Scattered left hemispheric/middle cerebral artery distribution infarcts involving portions of the left insula/periopercular region and left frontal lobe.    MRA of the brain  04/15/2012   Intracranial atherosclerotic type changes as detailed above.  Most significant abnormality is that involving the posterior cerebral arteries (more notable on the right).  Decreased number of visualized left middle cerebral artery branch vessels consistent with the patient's acute left middle cerebral artery distribution infarct.  No significant  stenosis of the M1 segment of the left middle cerebral artery.  Narrowing of the supraclinoid aspect of the left internal carotid artery.   2D Echocardiogram    Carotid Doppler    CXR  04/15/2012  Cardiomegaly and interstitial edema.    EKG  Sinus tachycardia. Left axis deviation. Left bundle branch block  Therapy Recommendations   Physical Exam  Pleasant middle aged african american lady not in distress.Awake alert. Afebrile. Head is nontraumatic. Neck is supple without bruit. Hearing is normal. Cardiac exam no murmur or gallop but mechanical heart sounds heard.. Lungs are clear to auscultation. Distal pulses are well felt. Neurological exam ; Awake  Alert oriented x 3. Mildly nonfluentspeech and language.Good comprehension, naming and mildly impaired repitition..Eye movements full without nystagmus.Fundi were not visualized. Vision acuity and fields appear normal. Hearing is normal. Palatal movements are normal. Face symmetric. Tongue midline. Normal strength, tone, reflexes and coordination. Diminished fine finger movements on right and orbits left over right upper extremity.. Normal sensation. Gait deferred.    ASSESSMENT [name] is a 57 y.o. female presenting with  Aphasia and right sided weakness secondary to scattered infarcts in the left insula/periopercular and frontal lobe. 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  On warfarin prior to admission. Now on warfarin and heparin for secondary stroke prevention. Patient with resultant  mild expressive aphasia and right hand weakness. Work up underway.  Hyperlipidemia, LDL 126, on lipitor, goal LDL < 100 (<70 given diabetes) Diabetes, HgbA1c 6.9  h/o persistent atrial fibrillation, on coumadin PRA  rheumatic heart disease  recently valve surgery 02/15/12 - mechanical mitral valve replacement, tricuspid valve repair, and complete biatrial lesion set MAZE using bipolar  radiofrequency and cryothermy ablation with clipping of left atrial appendage  Hospital day # 2  TREATMENT/PLAN  Continue  warfarin and heparin until goal INR 2.5-3 given mechanical heart valve  F/u 2d Echo, dopplers  PT/OT and ST consults  Ok to transfer to floor from neuro standpoint  Annie Main, MSN, RN, ANVP-BC, ANP-BC, GNP-BC [facility] Pager: [phone] 04/16/2012 8:46 AM  I have personally obtained a history, examined the patient, evaluated imaging results, and formulated the assessment and plan of care. I agree with the above.   Delia Heady, MD

## 2012-04-16 NOTE — Evaluation (Signed)
Occupational Therapy Evaluation Patient Details Name: [name] MRN: [id number] DOB: 1955/07/24 Today's Date: 04/16/2012 Time: 1111-1130 OT Time Calculation (min): 19 min  OT Assessment / Plan / Recommendation Clinical Impression  57 yo female admitted with Rt side weakness and aphasia with results revealing scattering of infarcts in Lt insula/ periopercular and frontal lobe. Ot to sign off. Ot to defer to outpatient    OT Assessment  All further OT needs can be met in the next venue of care    Follow Up Recommendations  Outpatient OT    Barriers to Discharge      Equipment Recommendations  None recommended by OT    Recommendations for Other Services    Frequency       Precautions / Restrictions Precautions Precautions: Fall   Pertinent Vitals/Pain No pain    ADL  Grooming: Wash/dry hands;Wash/dry face;Teeth care;Modified independent Where Assessed - Grooming: Unsupported standing Upper Body Bathing: Chest;Right arm;Left arm;Abdomen;Modified independent Where Assessed - Upper Body Bathing: Unsupported sit to stand Lower Body Bathing: Modified independent Where Assessed - Lower Body Bathing: Unsupported sit to stand Upper Body Dressing: Modified independent Where Assessed - Upper Body Dressing: Unsupported standing Toilet Transfer: Modified independent Toilet Transfer Method: Sit to Barista: Regular height toilet Toileting - Clothing Manipulation and Hygiene: Modified independent Where Assessed - Engineer, mining and Hygiene: Sit to stand from 3-in-1 or toilet Equipment Used: Gait belt Transfers/Ambulation Related to ADLs: Pt ambulated Mod I to the bathroom ADL Comments: Recommend follow up at outpatient for Rt hand weakness and aphasia. Pt completed full ADL without deficits at sink. Ot to sign off acutely    OT Diagnosis:    OT Problem List:   OT Treatment Interventions:     OT Goals    Visit Information  Last OT  Received On: 04/16/12 Assistance Needed: +1    Subjective Data  Subjective: "I haven't even thought about it" [name]   Prior Functioning     Home Living Lives With: Family Available Help at Discharge: Family;Available 24 hours/day Type of Home: House Home Access: Stairs to enter [employer] of Steps: 2 Entrance Stairs-Rails: None Home Layout: One level Home Adaptive Equipment: None Prior Function Level of Independence: Independent Able to Take Stairs?: Yes Driving: Yes Communication Communication: Expressive difficulties Dominant Hand: Right         Vision/Perception Vision - History Baseline Vision: No visual deficits Patient Visual Report: No change from baseline   Cognition  Cognition Overall Cognitive Status: Appears within functional limits for tasks assessed/performed Arousal/Alertness: Awake/alert Orientation Level: Appears intact for tasks assessed Behavior During Session: [facility] for tasks performed Cognition - Other Comments: no specifically tested- pt able to state family located in room and awareness to speech deficits    Extremity/Trunk Assessment Right Upper Extremity Assessment RUE ROM/Strength/Tone: Deficits RUE ROM/Strength/Tone Deficits: brunstrom VI RUE Sensation: WFL - Light Touch RUE Coordination: WFL - gross/fine motor Left Upper Extremity Assessment LUE ROM/Strength/Tone: Within functional levels LUE Sensation: WFL - Light Touch LUE Coordination: WFL - gross/fine motor Trunk Assessment Trunk Assessment: Normal     Mobility Bed Mobility Bed Mobility: Not assessed (sitting on EOB) Transfers Transfers: Sit to Stand;Stand to Sit Sit to Stand: 6: Modified independent (Device/Increase time);With upper extremity assist;From bed Stand to Sit: 6: Modified independent (Device/Increase time);Without upper extremity assist;To bed     Exercise     Balance Balance Balance  Assessed: Yes Dynamic Standing  Balance Dynamic Standing - Balance Support: Left upper extremity supported;During functional activity Dynamic Standing - Level of Assistance: 6: Modified independent (Device/Increase time) (left hand on sink for peri care) Dynamic Standing - Balance Activities: Other (comment) (bathing)   End of Session OT - End of Session Activity Tolerance: Patient tolerated treatment well Patient left: with call bell/phone within reach;in bed Nurse Communication: Mobility status;Precautions  GO     [name] 04/16/2012, 11:43 AM Pager: [phone]

## 2012-04-16 NOTE — Progress Notes (Signed)
Physical Therapy Treatment Patient Details Name: [name] MRN: [id number] DOB: 10-30-1955 Today's Date: 04/16/2012 Time: 1308-6578 PT Time Calculation (min): 36 min  PT Assessment / Plan / Recommendation Comments on Treatment Session  Patient with moderate fall risk per Sharlene Motts balance test (48/56,) and on dynamic gait index (16/21, excluded stair portion.)  Feel she may have some cognitive deficits and noted order for SLP cognitive eval.  Will need outpatient PT to address balance and activity tolerance issues.  PT to follow acutely.    Follow Up Recommendations  Outpatient PT           Equipment Recommendations  None recommended by PT       Frequency Min 4X/week   Plan Discharge plan remains appropriate    Precautions / Restrictions Precautions Precautions: Fall   Pertinent Vitals/Pain No complaints of pain    Mobility  Bed Mobility Bed Mobility: Not assessed Details for Bed Mobility Assistance: sitting edge of bed visiting with family Transfers Sit to Stand: 7: Independent;Without upper extremity assist;From bed;From chair/3-in-1 Stand to Sit: 7: Independent;To bed;To chair/3-in-1;Without upper extremity assist Details for Transfer Assistance: sit<>stand from bed, chair and bed<>chair; cues to try without UE assist  Ambulation/Gait Ambulation/Gait Assistance: 5: Supervision Ambulation Distance (Feet): 120 Feet Assistive device: None Ambulation/Gait Assistance Details: performed limited DGI (did not do stair portion,) pt c/o fatigue and did not walk any farther Gait velocity: slow pace, not measured today (despite cue to walk as fast as you can)    Exercises Other Exercises Other Exercises: Educated in warning signs of stroke.     PT Goals Acute Rehab PT Goals Pt will go Sit to Stand: with modified independence PT Goal: Sit to Stand - Progress: Met Pt will go Stand to Sit: with modified independence PT Goal: Stand to Sit - Progress: Met Pt will Ambulate: >150  feet;with modified independence PT Goal: Ambulate - Progress: Progressing toward goal  Visit Information  Last PT Received On: 04/16/12 Assistance Needed: +1    Subjective Data  Subjective: I am tired.   Cognition  Cognition Overall Cognitive Status: Impaired Area of Impairment: Attention Arousal/Alertness: Awake/alert Orientation Level: Appears intact for tasks assessed Behavior During Session: [facility] for tasks performed Current Attention Level: Sustained Attention - Other Comments: seems distracted needing repeated questions at times to answer sufficiently.  Occurred later in session when pt began to complain about fatigue Cognition - Other Comments: no specifically tested- pt able to state family located in room and awareness to speech deficits    Balance  Balance Balance Assessed: Yes Dynamic Standing Balance Dynamic Standing - Balance Support: Left upper extremity supported;During functional activity Dynamic Standing - Level of Assistance: 6: Modified independent (Device/Increase time) (left hand on sink for peri care) Dynamic Standing - Balance Activities: Other (comment) (bathing) Standardized Balance Assessment Standardized Balance Assessment: Berg Balance Test;Dynamic Gait Index Berg Balance Test Sit to Stand: Able to stand without using hands and stabilize independently Standing Unsupported: Able to stand safely 2 minutes Sitting with Back Unsupported but Feet Supported on Floor or Stool: Able to sit safely and securely 2 minutes Stand to Sit: Sits safely with minimal use of hands Transfers: Able to transfer safely, minor use of hands Standing Unsupported with Eyes Closed: Able to stand 10 seconds safely Standing Unsupported with Feet Together: Able to place feet together independently and stand 1 minute safely From Standing, Reach Forward with Outstretched Arm: Can reach confidently >25 cm (10") From Standing Position, Pick up Object from Floor:  Able to pick up shoe,  needs supervision From Standing Position, Turn to Look Behind Over each Shoulder: Looks behind one side only/other side shows less weight shift (decreased with looking over right shoulder) Turn 360 Degrees: Able to turn 360 degrees safely but slowly Standing Unsupported, Alternately Place Feet on Step/Stool: Able to complete >2 steps/needs minimal assist Standing Unsupported, One Foot in Front: Able to place foot tandem independently and hold 30 seconds Standing on One Leg: Able to lift leg independently and hold 5-10 seconds Total Score: 48/56 Dynamic Gait Index Level Surface: Mild Impairment Change in Gait Speed: Moderate Impairment Gait with Horizontal Head Turns: Normal Gait with Vertical Head Turns: Normal Gait and Pivot Turn: Mild Impairment Step Over Obstacle: Mild Impairment Step Around Obstacles: Normal Steps:  (not tested)  Score: 16/21 (due to stair portion excluded)  End of Session PT - End of Session Equipment Utilized During Treatment: Gait belt Activity Tolerance: Patient tolerated treatment well Patient left: in bed;with call bell/phone within reach;with family/visitor present   GP     [facility] 04/16/2012, 2:35 PM Sheran Lawless, PT [phone] 04/16/2012

## 2012-04-16 NOTE — Progress Notes (Signed)
Occupational Therapy Discharge Patient Details Name: [name] MRN: [id number] DOB: 12-Apr-1955 Today's Date: 04/16/2012 Time: 1191-4782 OT Time Calculation (min): 19 min  Patient discharged from OT services secondary to goals met and no further OT needs identified.  Please see latest therapy progress note for current level of functioning and progress toward goals.    Progress and discharge plan discussed with patient and/or caregiver: Patient/Caregiver agrees with plan  Defer to outpatient  GO     Lucile Shutters Pager: [phone]  04/16/2012, 11:43 AM

## 2012-04-16 NOTE — Progress Notes (Signed)
ANTICOAGULATION CONSULT NOTE - Follow Up Consult  Pharmacy Consult for Heparin Indication: History atrial fibrillation / MVR/ L-MCA ischemic infarction  Heparin Dosing Weight: 81 kg  Labs:  Recent Labs  04/15/12 2003 04/16/12 0450 04/16/12 1837  HGB  --  11.0*  --   HCT  --  33.8*  --   PLT  --  207  --   HEPARINUNFRC 0.55 0.61 0.57   Infusions:  . heparin 1,400 Units/hr (04/16/12 1237)   Assessment:  57 y/o female on Heparin infusion for L-MCA infarction with a history of atrial fibrillation and MVR.  Heparin level 0.57 units/ml on Heparin rate of 1400 units/hr.  Heparin level slightly above goal range.  Goal of Therapy:  Heparin level 0.3 - 0.5.   Plan:  Reduce Heparin to 1300 units/hr.  Next Heparin level with AM labs.   Lloyd Ayo, Deetta Perla.D 04/16/2012, 8:12 PM

## 2012-04-16 NOTE — Progress Notes (Signed)
SUBJECTIVE:  Feels better.    OBJECTIVE:   Vitals:   Filed Vitals:   04/15/12 2323 04/16/12 0337 04/16/12 0700 04/16/12 0803  BP: 126/83 129/81 139/99   Pulse:  110    Temp: 97.3 F (36.3 C) 98.1 F (36.7 C)  97.9 F (36.6 C)  TempSrc: Oral Oral  Oral  Resp: 25 28 14    Height:      Weight:      SpO2: 100% 98%     I&O's:   Intake/Output Summary (Last 24 hours) at 04/16/12 1610 Last data filed at 04/15/12 1700  Gross per 24 hour  Intake 980.95 ml  Output      0 ml  Net 980.95 ml   TELEMETRY: Reviewed telemetry pt in atrial flutter with 2:1 block:     PHYSICAL EXAM General: Well developed, well nourished, in no acute distress, speech slightly slurred Head:   Normal cephalic and atramatic  Lungs:   Clear bilaterally to auscultation and percussion. Heart:  Tachycardic, crisp S1, normal S2 Abdomen:  abdomen soft and non-tender  Extremities:  No edema.   Neuro: Alert and oriented X 3. Psych:  Normal affect, responds appropriately   LABS: Basic Metabolic Panel:  Recent Labs  96/04/[birth year] 1225 04/14/12 1240 04/16/12 0450  NA 137 143 137  K 3.2* 3.4* 4.2  CL 102 104 106  CO2 25  --  23  GLUCOSE 147* 152* 130*  BUN 10 9 14   CREATININE 0.76 0.80 0.79  CALCIUM 9.3  --  9.4  MG  --   --  2.2   Liver Function Tests:  Recent Labs  04/14/12 1225  AST 27  ALT 20  ALKPHOS 123*  BILITOT 0.7  PROT 8.2  ALBUMIN 3.6   No results found for this basename: LIPASE, AMYLASE,  in the last 72 hours CBC:  Recent Labs  04/14/12 1225  04/15/12 0530 04/16/12 0450  WBC 5.9  --  6.0 6.5  NEUTROABS 3.7  --   --   --   HGB 11.3*  < > 11.0* 11.0*  HCT 34.5*  < > 33.7* 33.8*  MCV 87.3  --  87.8 87.8  PLT 195  --  210 207  < > = values in this interval not displayed. Cardiac Enzymes:  Recent Labs  04/14/12 1225  TROPONINI <0.30   BNP: No components found with this basename: POCBNP,  D-Dimer: No results found for this basename: DDIMER,  in the last 72  hours Hemoglobin A1C:  Recent Labs  04/15/12 0530  HGBA1C 6.9*   Fasting Lipid Panel:  Recent Labs  04/15/12 0530  CHOL 169  HDL 27*  LDLCALC 126*  TRIG 78  CHOLHDL 6.3   Thyroid Function Tests: No results found for this basename: TSH, T4TOTAL, FREET3, T3FREE, THYROIDAB,  in the last 72 hours Anemia Panel: No results found for this basename: VITAMINB12, FOLATE, FERRITIN, TIBC, IRON, RETICCTPCT,  in the last 72 hours Coag Panel:   Lab Results  Component Value Date   INR 2.05* 04/16/2012   INR 1.84* 04/14/2012   INR 2.43* 02/25/2012    RADIOLOGY: Dg Chest 2 View  04/15/2012  *RADIOLOGY REPORT*  Clinical Data: History of stroke.  CHEST - 2 VIEW  Comparison: 03/19/2012  Findings: Patient has had median sternotomy and valve replacement. Heart is enlarged.  There is diffuse, interstitial pulmonary edema. No focal consolidations or definite pleural effusions are identified.  IMPRESSION: Cardiomegaly and interstitial edema.   Original Report Authenticated By: Lanora Manis  Manson Passey, M.D.    Dg Chest 2 View  03/19/2012  *RADIOLOGY REPORT*  Clinical Data: History of heart surgery.  CHEST - 2 VIEW  Comparison: 02/24/2012.  Findings: Trachea is midline.  Heart is enlarged, stable.  Right PICC has been removed.  Lungs are somewhat low in volume but clear. No pleural fluid.  IMPRESSION: Low lung volumes without acute finding.   Original Report Authenticated By: Leanna Battles, M.D.    Ct Head (brain) Wo Contrast  04/14/2012  *RADIOLOGY REPORT*  Clinical Data: Right-sided facial droop, right-sided weakness and lethargy.  CT HEAD WITHOUT CONTRAST  Technique:  Contiguous axial images were obtained from the base of the skull through the vertex without contrast.  Comparison: None.  Findings: The brain demonstrates no evidence of hemorrhage, infarction, edema, mass effect, extra-axial fluid collection, hydrocephalus or mass lesion.  The skull is unremarkable.  IMPRESSION: No acute findings.   Original Report  Authenticated By: Irish Lack, M.D.    Mr [facility] Wo Contrast  04/15/2012  *RADIOLOGY REPORT*  Clinical Data:  Atrial fibrillation.  Valve replacement.  Diabetic hypertensive patient.  Developed slurred speech and right arm weakness.  MRI BRAIN WITHOUT CONTRAST MRA HEAD WITHOUT CONTRAST  Technique: Multiplanar, multiecho pulse sequences of the brain and surrounding structures were obtained according to standard protocol without intravenous contrast.  Angiographic images of the head were obtained using MRA technique without contrast.  Comparison: 04/14/2012 head CT.  No comparison brain MR.  MRI HEAD  Findings:  Motion degraded exam.  Scattered left hemispheric/middle cerebral artery distribution infarcts involving portions of the left insula/periopercular region and left frontal lobe.  Punctate blood breakdown products posterior left frontal lobe is not associated with acute infarct and may be related to the prior tiny area of hemorrhagic ischemia.  No other areas of intracranial hemorrhage.  Small vessel disease type changes most notable left periventricular region.  Mild atrophy without hydrocephalus.  No intracranial mass lesion detected on this unenhanced exam.  Retention cyst left maxillary sinus.  Minimal opacification anterior left ethmoid sinus.  Minimal exophthalmos.  IMPRESSION: Scattered left hemispheric/middle cerebral artery distribution infarcts involving portions of the left insula/periopercular region and left frontal lobe.  Please see above.  MRA HEAD  Findings: Ectatic cavernous and supraclinoid segment of the internal carotid artery bilaterally with areas of mild narrowing (most notable left supraclinoid internal carotid artery just proximal to the carotid terminus).  Mild to moderate narrowing of the A1 segment of the anterior cerebral artery bilaterally.  Middle cerebral artery branch vessel irregularity bilaterally. Decreased number of visualized left middle cerebral artery branches  consistent with the patient's acute infarct.  M1 segment of the left middle cerebral artery does not appear significantly narrowed.  Right vertebral artery is dominant.  Narrowing of the distal left vertebral artery.  Mild irregularity of the PICA bilaterally.  Mild narrowing mid aspect of the basilar artery.  Nonvisualization AICAs.  High-grade stenosis proximal right posterior cerebral artery with abrupt occlusion of the right posterior cerebral artery proximal to the distal branches.  Moderate to marked narrowing at the bifurcation of the left posterior cerebral artery.  Mild to moderate narrowing superior cerebellar arteries bilaterally.  No aneurysm or vascular malformation is noted.  IMPRESSION: Intracranial atherosclerotic type changes as detailed above.  Most significant abnormality is that involving the posterior cerebral arteries (more notable on the right).  Decreased number of visualized left middle cerebral artery branch vessels consistent with the patient's acute left middle cerebral artery distribution infarct.  No significant stenosis of the M1 segment of the left middle cerebral artery.  Narrowing of the supraclinoid aspect of the left internal carotid artery.  Please see above.  Critical Value/emergent results were called by telephone at the time of interpretation on 04/15/2012 at 11:42 a.m. to [facility] the patient's nurse, who verbally acknowledged these results.   Original Report Authenticated By: Lacy Duverney, M.D.    Mr Brain Wo Contrast  04/15/2012  *RADIOLOGY REPORT*  Clinical Data:  Atrial fibrillation.  Valve replacement.  Diabetic hypertensive patient.  Developed slurred speech and right arm weakness.  MRI BRAIN WITHOUT CONTRAST MRA HEAD WITHOUT CONTRAST  Technique: Multiplanar, multiecho pulse sequences of the brain and surrounding structures were obtained according to standard protocol without intravenous contrast.  Angiographic images of the head were obtained using MRA technique  without contrast.  Comparison: 04/14/2012 head CT.  No comparison brain MR.  MRI HEAD  Findings:  Motion degraded exam.  Scattered left hemispheric/middle cerebral artery distribution infarcts involving portions of the left insula/periopercular region and left frontal lobe.  Punctate blood breakdown products posterior left frontal lobe is not associated with acute infarct and may be related to the prior tiny area of hemorrhagic ischemia.  No other areas of intracranial hemorrhage.  Small vessel disease type changes most notable left periventricular region.  Mild atrophy without hydrocephalus.  No intracranial mass lesion detected on this unenhanced exam.  Retention cyst left maxillary sinus.  Minimal opacification anterior left ethmoid sinus.  Minimal exophthalmos.  IMPRESSION: Scattered left hemispheric/middle cerebral artery distribution infarcts involving portions of the left insula/periopercular region and left frontal lobe.  Please see above.  MRA HEAD  Findings: Ectatic cavernous and supraclinoid segment of the internal carotid artery bilaterally with areas of mild narrowing (most notable left supraclinoid internal carotid artery just proximal to the carotid terminus).  Mild to moderate narrowing of the A1 segment of the anterior cerebral artery bilaterally.  Middle cerebral artery branch vessel irregularity bilaterally. Decreased number of visualized left middle cerebral artery branches consistent with the patient's acute infarct.  M1 segment of the left middle cerebral artery does not appear significantly narrowed.  Right vertebral artery is dominant.  Narrowing of the distal left vertebral artery.  Mild irregularity of the PICA bilaterally.  Mild narrowing mid aspect of the basilar artery.  Nonvisualization AICAs.  High-grade stenosis proximal right posterior cerebral artery with abrupt occlusion of the right posterior cerebral artery proximal to the distal branches.  Moderate to marked narrowing at the  bifurcation of the left posterior cerebral artery.  Mild to moderate narrowing superior cerebellar arteries bilaterally.  No aneurysm or vascular malformation is noted.  IMPRESSION: Intracranial atherosclerotic type changes as detailed above.  Most significant abnormality is that involving the posterior cerebral arteries (more notable on the right).  Decreased number of visualized left middle cerebral artery branch vessels consistent with the patient's acute left middle cerebral artery distribution infarct.  No significant stenosis of the M1 segment of the left middle cerebral artery.  Narrowing of the supraclinoid aspect of the left internal carotid artery.  Please see above.  Critical Value/emergent results were called by telephone at the time of interpretation on 04/15/2012 at 11:42 a.m. to [facility] the patient's nurse, who verbally acknowledged these results.   Original Report Authenticated By: Lacy Duverney, M.D.    Dg Swallowing Func-speech Pathology  04/15/2012  Lacinda Axon, CCC-SLP     04/15/2012  2:51 PM Objective Swallowing Evaluation: Bedside swallow evaluation  Patient Details  Name: [name] MRN: [id number] Date of Birth: [birth year]  Today's Date: 04/15/2012 Time: 1120-1140 SLP Time Calculation (min): 20 min  Past Medical History:  Past Medical History  Diagnosis Date  . Asthma   . Hypertension   . Obesity   . Chronic diastolic congestive heart failure   . Carotid bruit   . Hypercholesterolemia   . Polyp of rectum   . Abnormal chest CT   . Persistent atrial fibrillation   . Bronchospasm 1998  . Atrial enlargement, left     severe  . Heart murmur   . Sleep apnea     DOES NOT HAVE CPAP  . Diabetes mellitus     insulin dependent  . Mitral regurgitation   . Tricuspid regurgitation 01/16/2012  . Rheumatic fever 01/16/2012    Reported during childhood  . Atrial fibrillation 01/05/2011    Chronic persistent, failed DCCV   . Obesity (BMI 30-39.9) 01/16/2012  . Shortness of breath     with exertion  . History of  blood transfusion   . 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 S/P Maze operation for atrial fibrillation 02/15/2012    Complete biatrial lesion set using bipolar radiofrequency and  cryothermy ablation with clipping of LA appendage   Past Surgical History:  Past Surgical History  Procedure Laterality Date  . Cardiovascular stress test  09/2010  . Cardiac catheterization  >5 years  . Cardioversion  03/25/2011    Procedure: CARDIOVERSION;  Surgeon: Corky Crafts, MD;   Location: [facility] OR;  Service: Cardiovascular;  Laterality: N/A;  . Tee without cardioversion  12/21/2011    Procedure: TRANSESOPHAGEAL ECHOCARDIOGRAM (TEE);  Surgeon:  Corky Crafts, MD;  Location: [facility] ENDOSCOPY;  Service:  Cardiovascular;  Laterality: N/A;  . No past surgeries    . Tricuspid valve replacement  02/15/2012    Procedure: TRICUSPID VALVE REPAIR;  Surgeon: Purcell Nails,  MD;  Location: [facility] OR;  Service: Open Heart Surgery;  Laterality:  N/A;  . Maze  02/15/2012    Procedure: MAZE;  Surgeon: Purcell Nails, MD;  Location: [facility]  OR;  Service: Open Heart Surgery;  Laterality: N/A;  . Intraoperative transesophageal echocardiogram  02/15/2012    Procedure: INTRAOPERATIVE TRANSESOPHAGEAL ECHOCARDIOGRAM;   Surgeon: Purcell Nails, MD;  Location: [facility] OR;  Service: Open  Heart Surgery;  Laterality: N/A;  . Mitral valve replacement  02/15/2012    Procedure: MITRAL VALVE (MV) REPLACEMENT;  Surgeon: Purcell Nails, MD;  Location: [facility] OR;  Service: Open Heart Surgery;   Laterality: N/A;   HPI:    ANABELEN KAMINSKY is a 57 y.o. 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  Probably last time she was seen normal was at 8: 50. She was   found by her son sitting in the couch, weak, unable to speak.  Patient was initially a code stroke, patient was not candidate  for TPA due to elevated INR at 1.8. During my evaluation patient  has expressive and dysarthric aphasia. She thought that her  daughter was her sister. MRI: Scattered left hemispheric/middle  cerebral artery distribution, infarcts involving portions of the  left insula/peripercular  region and left frontal lobe.  Objective  assessment of MBS indicated due to results of BSE to assess risk  for aspiration.      Assessment / Plan / Recommendation Clinical Impression  Dysphagia Diagnosis: Mild oral phase dysphagia;Mild pharyngeal  phase dysphagia;Moderate cervical esophageal phase dysphagia Mild sensory motor oral dysphagia marked by decreased lingual  strength and coordination resulting in delay in oral transit,  oral holding, and piecemeal swallows.   Mild pharyngeal dysphagia  with deep penetration to cords initial swallow of thin liquid  barium by cup due to incomplete airway closure.  Patient sensed  penetration with subsequent effective throat clear.   Pharyngeal  swallow improved as evaluation proceeded as only one instance of  penetration occurring out of multiple trials of thin.  Minimal  pharyngeal residue at CP segment s/p swallow of puree, thin and  solid trials.  Brief esophageal screen indicates stasis in  thoracic esophagus with backflow of puree and thin liquids to  cervical esophagus.  Incomplete transit with whole barium tablet  with stasis in thoracic esophagus.  Cued dry swallows and sips of  thin liquid ineffective in completing transit.  Recommend to  proceed with Dysphagia 3 diet consistency with thin liquids with  full supervision due to cognitive status.  Recommend modified  diet mainly due to oral phase dysphagia.  ST to follow in acute  care setting for diet tolerance and possible advancement.  No  repeat MBS warranted at this time for diet advancement.  Patient  may  benefit from GI consult to assess esophageal functioning.     Treatment Recommendation  Therapy as outlined in treatment plan below    Diet Recommendation Dysphagia 3 (Mechanical Soft);Thin liquid   Liquid Administration via: Cup;Straw Medication Administration: Crushed with puree Supervision: Patient able to self feed;Full supervision/cueing  for compensatory strategies Compensations: Slow rate;Small sips/bites;Check for  pocketing;Multiple dry swallows after each bite/sip Postural Changes and/or Swallow Maneuvers: Out of bed for  meals;Seated upright 90 degrees;Upright 30-60 min after meal    Other  Recommendations Recommended Consults: Consider GI  evaluation Oral Care Recommendations: Oral care QID   Follow Up Recommendations  Outpatient SLP    Frequency and Duration min 2x/week  2 weeks       SLP Swallow Goals Patient will utilize recommended strategies during swallow to  increase swallowing safety with: Modified independent assistance   General Date of Onset: 04/14/12 Type of Study: Bedside swallow evaluation Reason for Referral: Objectively evaluate swallowing function Diet Prior to this Study: NPO Temperature Spikes Noted: No Respiratory Status: Room air History of Recent Intubation: No Behavior/Cognition: Alert;Cooperative;Pleasant mood;Requires  cueing Oral Cavity - Dentition: Adequate natural dentition Oral Motor / Sensory Function: Impaired - see Bedside swallow  eval Self-Feeding Abilities: Needs assist;Able to feed self Patient Positioning: Upright in chair Baseline Vocal Quality: Hoarse;Low vocal intensity Volitional Cough: Strong Volitional Swallow: Able to elicit Anatomy: Within functional limits Pharyngeal Secretions: Not observed secondary MBS    Reason for Referral Objectively evaluate swallowing function   Oral Phase Oral Preparation/Oral Phase Oral Phase: Impaired Oral - Nectar Oral - Nectar Cup: Weak lingual manipulation;Holding of  bolus;Piecemeal swallowing;Lingual/palatal residue;Delayed  oral  transit Oral - Thin Oral - Thin Cup: Weak lingual manipulation;Delayed oral  transit;Lingual/palatal residue;Piecemeal swallowing;Holding of  bolus;Reduced posterior propulsion Oral - Solids Oral - Puree: Weak lingual manipulation;Reduced posterior  propulsion;Holding of bolus;Piecemeal swallowing;Lingual/palatal  residue;Delayed oral transit Oral - Regular: Impaired mastication;Weak lingual  manipulation;Incomplete tongue to palate contact;Holding of  bolus;Piecemeal  swallowing;Lingual/palatal residue;Delayed oral  transit Oral - Pill: Weak lingual manipulation;Holding of bolus;Delayed  oral transit   Pharyngeal Phase Pharyngeal Phase Pharyngeal Phase: Impaired Pharyngeal - Nectar Pharyngeal - Nectar Cup: Premature spillage to valleculae;Reduced  pharyngeal peristalsis;Reduced airway/laryngeal closure;Reduced  tongue base retraction;Pharyngeal residue - cp segment Pharyngeal - Thin Pharyngeal - Thin Cup: Premature spillage to valleculae;Reduced  airway/laryngeal closure;Penetration/Aspiration before  swallow;Penetration/Aspiration during swallow;Trace  aspiration;Pharyngeal residue - cp segment Penetration/Aspiration details (thin cup): Material enters  airway, CONTACTS cords then ejected out Pharyngeal - Thin Straw: Premature spillage to valleculae;Reduced  pharyngeal peristalsis;Reduced tongue base retraction;Reduced  airway/laryngeal closure Pharyngeal - Solids Pharyngeal - Puree: Premature spillage to valleculae;Reduced  tongue base retraction;Pharyngeal residue - cp segment;Pharyngeal  residue - pyriform sinuses Pharyngeal - Regular: Premature spillage to valleculae;Reduced  tongue base retraction;Pharyngeal residue - cp segment Pharyngeal - Pill: Delayed swallow initiation  Cervical Esophageal Phase    GO    Cervical Esophageal Phase Cervical Esophageal Phase: Impaired Cervical Esophageal Phase - Thin Thin Cup: Esophageal backflow into cervical esophagus;Prominent  cricopharyngeal segment;Reduced  cricopharyngeal relaxation Cervical Esophageal Phase - Solids Regular: Reduced cricopharyngeal relaxation;Prominent  cricopharyngeal segment;Esophageal backflow into cervical  esophagus Pill: Esophageal backflow into cervical esophagus;Reduced  cricopharyngeal relaxation;Prominent cricopharyngeal segment        Moreen Fowler MS, CCC-SLP [phone] [facility] 04/15/2012, 2:32 PM        ASSESSMENT: CVA s/p mechanical MVR, TV repair; atrial flutter on tele per EP  PLAN:  Target INR 2.5-3.0 given mitral valve replacement and CVA.  Valve sounds normal on exam.  Rate control for AFlutter at this time.  Increase metoprolol.  Use calcium channel blocker if BP becomes significantly elevated.  Would have to be anticoagulated for a month prior to any attempt at cardioversion.  COntinue heparin until INR > 2.5.  Corky Crafts., MD  04/16/2012  8:21 AM

## 2012-04-16 NOTE — Progress Notes (Signed)
Carotid dopplers canceled as pre-CABG dopplers performed Feb 2014 were WNL.  Annie Main, MSN, RN, ANVP-BC, ANP-BC, [facility] Pager: [phone] 04/16/2012 2:39 PM   . I agree with the above. Delia Heady, MD

## 2012-04-16 NOTE — Progress Notes (Signed)
Pt to TX to N-32, VSS, called report.

## 2012-04-16 NOTE — Progress Notes (Signed)
[facility] Progress Note [place] TEAM 1 - Stepdown/ICU TEAM   [name] AVW:[id number] DOB: 31-May-1955 DOA: 04/14/2012 PCP: Hulda Humphrey, NP  Brief narrative: 57 y.o. 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 She was found by her son sitting on the couch weak and unable to speak. Patient was initially a code stroke but was not a candidate for TPA due to elevated INR at 1.8. During the initial evaluation the patient had expressive aphasia. She thought her daughter was her sister. She was following some simple commands. She denied chest pain or SOB.   Assessment/Plan:  Acute CVA - Scattered left hemispheric/middle cerebral artery distribution infarcts involving left insula/periopercular region and left frontal lobe Stroke Team following - w/u underway - INR was not therapeutic at the time of admission - currently covering with IV heparin - cleared for diet so esumed coumadin - PT/OT/SLP tx - carotid dopplers pending - stable for transfer to tele bed today   Hypertension Well controlled at the present time   Diabetes mellitus Poorly controlled w/ HbA1c of 8.7 - CBG controlled as inpatient at this time - follow trend and educate on need for strict control   Hx of Chronic Atrial fibrillation / atypical flutter Care as per Cardiology - S/p MAZE at time of valve surgery   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 - Cardiology is following - pt is on BB and ARB  Obesity  HLD Stroke Team has adjusted medical tx in  response to elevated LDL  Code Status: FULL Family Communication: Spoke with patient and daughter at bedside Disposition Plan: transfer to neuro unit on tele   Consultants: Stroke team Cardiology  Procedures: 4/6 - TTE - Systolic function was severely reduced. The estimated ejection fraction was in the range of 20% to 25%. Diffuse hypokinesis. 4/6 - carotid Dopplers - results pending  Antibiotics: None  DVT prophylaxis: IV heparin >> coumadin  HPI/Subjective: The patient feels that her speech is improved today.  She has begun to walk with physical therapy.  She denies chest pain shortness of breath fevers or chills.  Objective: Blood pressure 139/99, pulse 110, temperature 97.9 F (36.6 C), temperature source Oral, resp. rate 14, height 5\' 6"  (1.676 m), weight 98.4 kg (216 lb 14.9 oz), SpO2 98.00%.  Intake/Output Summary (Last 24 hours) at 04/16/12 1223 Last data filed at 04/16/12 0900  Gross per 24 hour  Intake 1636.95 ml  Output      0 ml  Net 1636.95 ml   Exam: General: No acute respiratory distress Lungs: Clear to auscultation bilaterally without wheezes or crackles Cardiovascular: Regular rate with metallic systolic click Abdomen: Nontender, nondistended, soft, bowel sounds positive, no rebound, no ascites, no appreciable mass Extremities: No significant cyanosis, clubbing, or edema bilateral lower extremities Neuro:  Alert and oriented x4  Data Reviewed: Basic Metabolic Panel:  Recent Labs Lab 04/14/12 1225 04/14/12 1240 04/16/12 0450  NA 137 143 137  K 3.2* 3.4* 4.2  CL 102 104 106  CO2 25  --  23  GLUCOSE 147* 152* 130*  BUN 10 9 14   CREATININE 0.76 0.80 0.79  CALCIUM 9.3  --  9.4  MG  --   --  2.2   Liver Function Tests:  Recent Labs Lab 04/14/12 1225  AST 27  ALT 20  ALKPHOS 123*  BILITOT 0.7  PROT 8.2  ALBUMIN 3.6   CBC:  Recent Labs Lab 04/14/12 1225 04/14/12 1240 04/15/12 0530 04/16/12 0450  WBC 5.9  --  6.0 6.5  NEUTROABS  3.7  --   --   --   HGB 11.3* 12.6 11.0* 11.0*  HCT 34.5* 37.0 33.7* 33.8*  MCV 87.3  --  87.8 87.8  PLT 195  --  210 207   Cardiac Enzymes:  Recent Labs Lab 04/14/12 1225  TROPONINI <0.30   BNP (last 3 results)  Recent Labs  12/19/11 0431 12/19/11 1145 02/24/12 0600  PROBNP 1004.0* 1138.0* 1163.0*   CBG:  Recent Labs Lab 04/15/12 0827 04/15/12 1158 04/15/12 1638 04/15/12 2207 04/16/12 0806  GLUCAP 94 82 93 162* 122*    Recent Results (from the past 240 hour(s))  MRSA PCR SCREENING     Status: None   Collection Time    04/14/12  5:40 PM      Result Value Range Status   MRSA by PCR NEGATIVE  NEGATIVE Final   Comment:            The GeneXpert MRSA Assay (FDA     approved for NASAL specimens     only), is one component of a     comprehensive MRSA colonization     surveillance program. It is not     intended to diagnose MRSA     infection nor to guide or     monitor treatment for     MRSA infections.     Studies:  Recent x-ray studies have been reviewed in detail by the Attending Physician  Scheduled Meds:  Scheduled Meds: . atorvastatin  80 mg Oral QPM  . insulin aspart  0-9 Units Subcutaneous TID WC  . irbesartan  300 mg Oral Daily  . metoprolol tartrate  50 mg Oral BID  . potassium chloride  20 mEq Oral Daily  . warfarin  5 mg Oral ONCE-1800  . Warfarin - Pharmacist Dosing Inpatient   Does not apply q1800   Continuous Infusions: . sodium chloride 40 mL/hr at 04/16/12 0900  . heparin 1,500 Units/hr (04/16/12 0900)    Time spent on care of this patient:   [facility]  [facility] Office  [phone] Pager - Text Page per Loretha Stapler as per below:  On-Call/Text Page:      [email or web address]      password [email or web address]  If 7PM-7AM, please contact night-coverage [email or web address] Password [email or web address] 04/16/2012, 12:23 PM   LOS: 2 days

## 2012-04-16 NOTE — Progress Notes (Addendum)
ANTICOAGULATION CONSULT NOTE - Follow-up Consult  Pharmacy Consult for UFH Indication: h/o afib/MVR  No Known Allergies  Patient Measurements: Height: 5\' 6"  (167.6 cm) Weight: 216 lb 14.9 oz (98.4 kg) IBW/kg (Calculated) : 59.3 Heparin Dosing Weight: 81kg   Vital Signs: Temp: 97.9 F (36.6 C) (04/07 0803) Temp src: Oral (04/07 0803) BP: 139/99 mmHg (04/07 0700) Pulse Rate: 110 (04/07 0337)  Labs:  Recent Labs  04/14/12 1225 04/14/12 1240  04/15/12 0530 04/15/12 1245 04/15/12 2003 04/16/12 0450  HGB 11.3* 12.6  --  11.0*  --   --  11.0*  HCT 34.5* 37.0  --  33.7*  --   --  33.8*  PLT 195  --   --  210  --   --  207  APTT 43*  --   --   --   --   --   --   LABPROT 20.6*  --   --   --   --   --  22.3*  INR 1.84*  --   --   --   --   --  2.05*  HEPARINUNFRC  --   --   < > 0.24* 0.63 0.55 0.61  CREATININE 0.76 0.80  --   --   --   --  0.79  TROPONINI <0.30  --   --   --   --   --   --   < > = values in this interval not displayed.  Estimated Creatinine Clearance: 91.7 ml/min (by C-G formula based on Cr of 0.79).   Assessment: Heparin level = 0.63 on IV heparin 1500 units/hr in this 57 y/o female with  left MCA ischemic infarction.  On chronic coumadin treatment PTA for afib/MVR. INR was subtherapeutic on admit 04/14/12. Today is therapeutic, INR  = 2.05. Continues on heparin bridge.  PLTC 207K,  H/H 11.0/33.8.   Home dose is 6mg  daily except 4mg  on Saturday.  Last taken on 04/13/12 PTA.  No dose on 4/5. Dose of 8mg  given yesterday on 04/15/12. INR therapeutic today 2.05 No bleeding noted.    Goal of Therapy:  Heparin level 0.3-0.5 units/ml Monitor platelets by anticoagulation protocol: Yes INR 2.5 - 3.5   Plan:  Decrease Heparin IV rate to 1400 units/hr. Check 6 hr Heparin level Coumadin 5mg  x 1 Daily heparin level , INR and CBC  Noah Delaine, RPh Clinical Pharmacist Pager: [phone]  04/16/2012 12:04 PM   Addendum:  INR goal 2.5-3.5 per cardiology Change  Coumadin dose today to 6mg  po x1  Noah Delaine, RPh Clinical Pharmacist Pager: [phone] 04/16/2012 12:29 PM

## 2012-04-16 NOTE — Evaluation (Signed)
Speech Language Pathology Evaluation and Dysphagia treatment Patient Details Name: [name] MRN: [id number] DOB: 1955/10/30 Today's Date: 04/16/2012 Time: 4098-1191 SLP Time Calculation (min): 15 min; dysphagia treatment 1415-1425 - 10 min  Problem List:  Patient Active Problem List  Diagnosis  . Hypertension  . Diabetes mellitus  . Hypercholesterolemia  . Mediastinal lymphadenopathy  . Asthma  . Atrial fibrillation  . Acute CHF (congestive heart failure)  . Snoring  . Acute on chronic diastolic CHF (congestive heart failure)  . Mitral regurgitation  . Tricuspid regurgitation  . Rheumatic fever  . Obesity (BMI 30-39.9)  . CHF (congestive heart failure)  . MR (mitral regurgitation)  . S/P mitral valve replacement with metallic valve  . S/P tricuspid valve repair  . S/P Maze operation for atrial fibrillation  . S/P TVR (tricuspid valve repair)  . S/P MVR (mitral valve replacement)  . CVA (cerebral infarction)  . Atrial flutter   Past Medical History:  Past Medical History  Diagnosis Date  . Asthma   . Hypertension   . Obesity   . Chronic diastolic congestive heart failure   . Carotid bruit   . Hypercholesterolemia   . Polyp of rectum   . Abnormal chest CT   . Persistent atrial fibrillation   . Bronchospasm 1998  . Atrial enlargement, left     severe  . Heart murmur   . Sleep apnea     DOES NOT HAVE CPAP  . Diabetes mellitus     insulin dependent  . Mitral regurgitation   . Tricuspid regurgitation 01/16/2012  . Rheumatic fever 01/16/2012    Reported during childhood  . Atrial fibrillation 01/05/2011    Chronic persistent, failed DCCV   . Obesity (BMI 30-39.9) 01/16/2012  . Shortness of breath     with exertion  . History of blood transfusion   . 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 S/P Maze operation  for atrial fibrillation 02/15/2012    Complete biatrial lesion set using bipolar radiofrequency and cryothermy ablation with clipping of LA appendage  . Atrial flutter    Past Surgical History:  Past Surgical History  Procedure Laterality Date  . Cardiovascular stress test  09/2010  . Cardiac catheterization  >5 years  . Cardioversion  03/25/2011    Procedure: CARDIOVERSION;  Surgeon: Corky Crafts, MD;  Location: [facility] OR;  Service: Cardiovascular;  Laterality: N/A;  . Tee without cardioversion  12/21/2011    Procedure: TRANSESOPHAGEAL ECHOCARDIOGRAM (TEE);  Surgeon: Corky Crafts, MD;  Location: [facility] ENDOSCOPY;  Service: Cardiovascular;  Laterality: N/A;  . No past surgeries    . Tricuspid valve replacement  02/15/2012    Procedure: TRICUSPID VALVE REPAIR;  Surgeon: Purcell Nails, MD;  Location: [facility] OR;  Service: Open Heart Surgery;  Laterality: N/A;  . Maze  02/15/2012    Procedure: MAZE;  Surgeon: Purcell Nails, MD;  Location: [facility] OR;  Service: Open Heart Surgery;  Laterality: N/A;  . Intraoperative transesophageal echocardiogram  02/15/2012    Procedure: INTRAOPERATIVE TRANSESOPHAGEAL ECHOCARDIOGRAM;  Surgeon: Purcell Nails, MD;  Location: [facility] OR;  Service: Open Heart Surgery;  Laterality: N/A;  . Mitral valve replacement  02/15/2012    Procedure: MITRAL VALVE (MV) REPLACEMENT;  Surgeon: Purcell Nails, MD;  Location: [facility] OR;  Service: Open Heart Surgery;  Laterality: N/A;  HPI:  57 y.o. 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  CT revealed acute left CVA - Scattered left hemispheric/middle cerebral artery distribution infarcts involving left insula/periopercular region and left frontal lobe   Assessment / Plan / Recommendation Clinical Impression  Pt presents with a mild-mod agrammatic aphasia with decreased use of syntax, decreased flexibility/range of   expressive output; mild deficits in auditory comprehension.  Pt will benefit from f/u speech-language therapy in next venue of care (OT rec OP).  Will follow while hospitalized to address education/basic communication needs.        Dysphagia treatment:    F/u after MBS: pt able to convey basic precautions to maintain safety with POs- reviewed results/rationale.  Self-feeding; consumed thin liquids with min assist for precautions; no clinical signs of penetration present today.  Recommend continue Dysphagia 3 with thin liquids.      Follow Up Recommendations  Outpatient SLP    Frequency and Duration min 2x/week  1 week      SLP Goals  SLP Goals Potential to Achieve Goals: Good SLP Goal #1: Pt will describe pictured items with mod cues to use complete sentences with 80% accuracy. SLP Goal #2: Pt will follow two-step commands with 80% accuracy and min assist.  Dysphagia goals:  Progressing toward goals  SLP Evaluation Prior Functioning  Cognitive/Linguistic Baseline: Within functional limits Type of Home: House Lives With: Family Available Help at Discharge: Family;Available 24 hours/day Vocation: Other (comment) (works in Pharmacologist at [employer])   Cognition  Overall Cognitive Status: Appears within functional limits for tasks assessed Arousal/Alertness: Awake/alert    Comprehension  Auditory Comprehension Overall Auditory Comprehension: Impaired Yes/No Questions: Impaired Complex Questions: 50-74% accurate Commands: Impaired Two Step Basic Commands: 75-100% accurate Visual Recognition/Discrimination Discrimination: Exceptions to [employer] Drawings: Other (comment) (80% accuracy) Reading Comprehension Reading Status: Within funtional limits    Expression Expression Primary Mode of Expression: Verbal Verbal Expression Overall Verbal Expression: Impaired Initiation: No impairment Automatic Speech: Name;Social Response;Counting Level of  Generative/Spontaneous Verbalization: Phrase Repetition: No impairment Naming: Impairment Responsive: 76-100% accurate Black/White Line Drawings: Other (comment) (80% accuracy) Divergent: 25-49% accurate Written Expression Dominant Hand: Right Written Expression: Not tested   Oral / Motor Oral Motor/Sensory Function Overall Oral Motor/Sensory Function: Impaired Motor Speech Overall Motor Speech: Impaired Articulation: Impaired Level of Impairment: Phrase Intelligibility: Intelligibility reduced Conversation: 75-100% accurate Motor Speech Errors: Not applicable        Blenda Mounts Laurice 04/16/2012, 2:39 PM

## 2012-04-17 DIAGNOSIS — I5033 Acute on chronic diastolic (congestive) heart failure: Secondary | ICD-10-CM

## 2012-04-17 LAB — PROTIME-INR
INR: 2.56 — ABNORMAL HIGH (ref 0.00–1.49)
Prothrombin Time: 26.3 seconds — ABNORMAL HIGH (ref 11.6–15.2)

## 2012-04-17 LAB — CBC
Hemoglobin: 10.8 g/dL — ABNORMAL LOW (ref 12.0–15.0)
MCH: 28.6 pg (ref 26.0–34.0)
Platelets: 210 10*3/uL (ref 150–400)
RBC: 3.77 MIL/uL — ABNORMAL LOW (ref 3.87–5.11)
WBC: 6.8 10*3/uL (ref 4.0–10.5)

## 2012-04-17 LAB — GLUCOSE, CAPILLARY
Glucose-Capillary: 109 mg/dL — ABNORMAL HIGH (ref 70–99)
Glucose-Capillary: 85 mg/dL (ref 70–99)

## 2012-04-17 LAB — HEPARIN LEVEL (UNFRACTIONATED): Heparin Unfractionated: 0.51 IU/mL (ref 0.30–0.70)

## 2012-04-17 MED ORDER — FUROSEMIDE 10 MG/ML IJ SOLN
40.0000 mg | Freq: Once | INTRAMUSCULAR | Status: AC
  Administered 2012-04-17: 40 mg via INTRAVENOUS
  Filled 2012-04-17: qty 4

## 2012-04-17 MED ORDER — WARFARIN SODIUM 4 MG PO TABS
4.0000 mg | ORAL_TABLET | Freq: Once | ORAL | Status: AC
  Administered 2012-04-17: 4 mg via ORAL
  Filled 2012-04-17: qty 1

## 2012-04-17 NOTE — Progress Notes (Signed)
Physical Therapy Treatment Patient Details Name: [name] MRN: [id number] DOB: 24-Dec-1955 Today's Date: 04/17/2012 Time: 4098-1191 PT Time Calculation (min): 24 min  PT Assessment / Plan / Recommendation Comments on Treatment Session  Pt made excellent progress with mobility. Pt reports some difficulty with Right hand grip weakness and stiffness in R LE. Pt is currently Independent with mobility on the unit. Pt was able to tolerate some dynamic challanges to balance without difficulty. Pt was able to complete a path finding task and follow multi-step commands without difficulty. Pt scored a 54/56 on the Berg balance test. Pt is currently safe to ambulate in the room or the hall without assistance. Educated pt on home exercise program for LE strengthening. Recommend d/c home and do not feel pt needs any follow-up PT services at this time. Pt given a HEP and demonstrates good understanding. Pt is d/c from acute PT services due to goals of Independence met.    Follow Up Recommendations  No PT follow up     Does the patient have the potential to tolerate intense rehabilitation     Barriers to Discharge        Equipment Recommendations  None recommended by PT    Recommendations for Other Services    Frequency     Plan Discharge plan remains appropriate    Precautions / Restrictions     Pertinent Vitals/Pain     Mobility  Bed Mobility Bed Mobility: Not assessed Transfers Transfers: Sit to Stand;Stand to Sit Sit to Stand: 7: Independent Stand to Sit: 7: Independent Ambulation/Gait Ambulation/Gait Assistance: 7: Independent Ambulation Distance (Feet): 325 Feet Assistive device: None Gait Pattern: Within Functional Limits Gait velocity: good Stairs: Yes Stairs Assistance: 6: Modified independent (Device/Increase time) Number of Stairs: 3    Exercises General Exercises - Lower Extremity Hip ABduction/ADduction: AROM;Strengthening;Supine;20 reps;Both Straight Leg Raises:  AROM;Strengthening;Both;20 reps;Supine Hip Flexion/Marching: AROM;Strengthening;Both;20 reps;Standing Heel Raises: AROM;Strengthening;Both;20 reps;Standing Mini-Sqauts: AROM;Strengthening;Both;20 reps;Standing   PT Diagnosis:    PT Problem List:   PT Treatment Interventions:     PT Goals Acute Rehab PT Goals PT Goal: Supine/Side to Sit - Progress: Met PT Goal: Sit to Supine/Side - Progress: Met PT Goal: Sit to Stand - Progress: Met PT Goal: Stand to Sit - Progress: Met PT Goal: Ambulate - Progress: Met PT Goal: Up/Down Stairs - Progress: Met  Visit Information  Last PT Received On: 04/17/12    Subjective Data  Subjective: I feel like I can go home.   Cognition  Cognition Overall Cognitive Status: Appears within functional limits for tasks assessed/performed Arousal/Alertness: Awake/alert Orientation Level: Appears intact for tasks assessed Behavior During Session: [employer] for tasks performed Following Commands: Follows multi-step commands consistently Cognition - Other Comments: pt able to complete a path finding task without difficulty    Balance  Balance Balance Assessed: Yes Dynamic Standing Balance Dynamic Standing - Balance Support: No upper extremity supported Dynamic Standing - Level of Assistance: 7: Independent Berg Balance Test Sit to Stand: Able to stand without using hands and stabilize independently Standing Unsupported: Able to stand safely 2 minutes Sitting with Back Unsupported but Feet Supported on Floor or Stool: Able to sit safely and securely 2 minutes Stand to Sit: Sits safely with minimal use of hands Transfers: Able to transfer safely, minor use of hands Standing Unsupported with Eyes Closed: Able to stand 10 seconds safely Standing Ubsupported with Feet Together: Able to place feet together independently and stand 1 minute safely From Standing, Reach Forward with Outstretched Arm:  Can reach confidently >25 cm (10") From Standing Position, Pick up  Object from Floor: Able to pick up shoe safely and easily From Standing Position, Turn to Look Behind Over each Shoulder: Looks behind from both sides and weight shifts well Turn 360 Degrees: Able to turn 360 degrees safely in 4 seconds or less Standing Unsupported, Alternately Place Feet on Step/Stool: Able to stand independently and complete 8 steps >20 seconds Standing Unsupported, One Foot in Front: Able to place foot tandem independently and hold 30 seconds Standing on One Leg: Able to lift leg independently and hold 5-10 seconds Total Score: 54 High Level Balance High Level Balance Activites: Direction changes;Turns;Sudden stops;Head turns High Level Balance Comments: Independent  End of Session PT - End of Session Equipment Utilized During Treatment: Gait belt Activity Tolerance: Patient tolerated treatment well Patient left: in chair Nurse Communication: Other (comment) (pt is independent with mobility)   GP     Greggory Stallion 04/17/2012, 9:05 AM

## 2012-04-17 NOTE — Progress Notes (Signed)
SUBJECTIVE:  Feels better.  PT signed off.  OBJECTIVE:   Vitals:   Filed Vitals:   04/16/12 1630 04/16/12 2137 04/17/12 0252 04/17/12 0744  BP: 121/91 129/91 132/87 114/89  Pulse: 106 106 107 83  Temp: 97.4 F (36.3 C) 97.9 F (36.6 C) 97.9 F (36.6 C) 97.6 F (36.4 C)  TempSrc: Oral Oral Oral Oral  Resp: 17 16 16 16   Height:      Weight:      SpO2: 94% 100% 93% 100%   I&O's:    Intake/Output Summary (Last 24 hours) at 04/17/12 0957 Last data filed at 04/17/12 0700  Gross per 24 hour  Intake    120 ml  Output      0 ml  Net    120 ml   TELEMETRY: Reviewed telemetry pt in atrial flutter with 2:1 block:     PHYSICAL EXAM General: Well developed, well nourished, in no acute distress, speech slightly slurred Head:   Normal cephalic and atramatic  Lungs:   Clear bilaterally to auscultation and percussion. Heart:  Rate controlled, crisp S1 click, normal S2 Abdomen:  abdomen soft and non-tender  Extremities:  No edema.   Neuro: Alert and oriented X 3. Psych:  Normal affect, responds appropriately   LABS: Basic Metabolic Panel:  Recent Labs  16/10/96 1225 04/14/12 1240 04/16/12 0450  NA 137 143 137  K 3.2* 3.4* 4.2  CL 102 104 106  CO2 25  --  23  GLUCOSE 147* 152* 130*  BUN 10 9 14   CREATININE 0.76 0.80 0.79  CALCIUM 9.3  --  9.4  MG  --   --  2.2   Liver Function Tests:  Recent Labs  04/14/12 1225  AST 27  ALT 20  ALKPHOS 123*  BILITOT 0.7  PROT 8.2  ALBUMIN 3.6   No results found for this basename: LIPASE, AMYLASE,  in the last 72 hours CBC:  Recent Labs  04/14/12 1225  04/16/12 0450 04/17/12 0500  WBC 5.9  < > 6.5 6.8  NEUTROABS 3.7  --   --   --   HGB 11.3*  < > 11.0* 10.8*  HCT 34.5*  < > 33.8* 33.2*  MCV 87.3  < > 87.8 88.1  PLT 195  < > 207 210  < > = values in this interval not displayed. Cardiac Enzymes:  Recent Labs  04/14/12 1225  TROPONINI <0.30   BNP: No components found with this basename: POCBNP,  D-Dimer: No  results found for this basename: DDIMER,  in the last 72 hours Hemoglobin A1C:  Recent Labs  04/15/12 0530  HGBA1C 6.9*   Fasting Lipid Panel:  Recent Labs  04/15/12 0530  CHOL 169  HDL 27*  LDLCALC 126*  TRIG 78  CHOLHDL 6.3   Thyroid Function Tests: No results found for this basename: TSH, T4TOTAL, FREET3, T3FREE, THYROIDAB,  in the last 72 hours Anemia Panel: No results found for this basename: VITAMINB12, FOLATE, FERRITIN, TIBC, IRON, RETICCTPCT,  in the last 72 hours Coag Panel:   Lab Results  Component Value Date   INR 2.56* 04/17/2012   INR 2.05* 04/16/2012   INR 1.84* 04/14/2012    RADIOLOGY: Dg Chest 2 View  04/15/2012  *RADIOLOGY REPORT*  Clinical Data: History of stroke.  CHEST - 2 VIEW  Comparison: 03/19/2012  Findings: Patient has had median sternotomy and valve replacement. Heart is enlarged.  There is diffuse, interstitial pulmonary edema. No focal consolidations or definite pleural effusions are  identified.  IMPRESSION: Cardiomegaly and interstitial edema.   Original Report Authenticated By: Norva Pavlov, M.D.    Dg Chest 2 View  03/19/2012  *RADIOLOGY REPORT*  Clinical Data: History of heart surgery.  CHEST - 2 VIEW  Comparison: 02/24/2012.  Findings: Trachea is midline.  Heart is enlarged, stable.  Right PICC has been removed.  Lungs are somewhat low in volume but clear. No pleural fluid.  IMPRESSION: Low lung volumes without acute finding.   Original Report Authenticated By: Leanna Battles, M.D.    Ct Head (brain) Wo Contrast  04/14/2012  *RADIOLOGY REPORT*  Clinical Data: Right-sided facial droop, right-sided weakness and lethargy.  CT HEAD WITHOUT CONTRAST  Technique:  Contiguous axial images were obtained from the base of the skull through the vertex without contrast.  Comparison: None.  Findings: The brain demonstrates no evidence of hemorrhage, infarction, edema, mass effect, extra-axial fluid collection, hydrocephalus or mass lesion.  The skull is  unremarkable.  IMPRESSION: No acute findings.   Original Report Authenticated By: Irish Lack, M.D.    Mr [facility] Wo Contrast  04/15/2012  *RADIOLOGY REPORT*  Clinical Data:  Atrial fibrillation.  Valve replacement.  Diabetic hypertensive patient.  Developed slurred speech and right arm weakness.  MRI BRAIN WITHOUT CONTRAST MRA HEAD WITHOUT CONTRAST  Technique: Multiplanar, multiecho pulse sequences of the brain and surrounding structures were obtained according to standard protocol without intravenous contrast.  Angiographic images of the head were obtained using MRA technique without contrast.  Comparison: 04/14/2012 head CT.  No comparison brain MR.  MRI HEAD  Findings:  Motion degraded exam.  Scattered left hemispheric/middle cerebral artery distribution infarcts involving portions of the left insula/periopercular region and left frontal lobe.  Punctate blood breakdown products posterior left frontal lobe is not associated with acute infarct and may be related to the prior tiny area of hemorrhagic ischemia.  No other areas of intracranial hemorrhage.  Small vessel disease type changes most notable left periventricular region.  Mild atrophy without hydrocephalus.  No intracranial mass lesion detected on this unenhanced exam.  Retention cyst left maxillary sinus.  Minimal opacification anterior left ethmoid sinus.  Minimal exophthalmos.  IMPRESSION: Scattered left hemispheric/middle cerebral artery distribution infarcts involving portions of the left insula/periopercular region and left frontal lobe.  Please see above.  MRA HEAD  Findings: Ectatic cavernous and supraclinoid segment of the internal carotid artery bilaterally with areas of mild narrowing (most notable left supraclinoid internal carotid artery just proximal to the carotid terminus).  Mild to moderate narrowing of the A1 segment of the anterior cerebral artery bilaterally.  Middle cerebral artery branch vessel irregularity bilaterally. Decreased  number of visualized left middle cerebral artery branches consistent with the patient's acute infarct.  M1 segment of the left middle cerebral artery does not appear significantly narrowed.  Right vertebral artery is dominant.  Narrowing of the distal left vertebral artery.  Mild irregularity of the PICA bilaterally.  Mild narrowing mid aspect of the basilar artery.  Nonvisualization AICAs.  High-grade stenosis proximal right posterior cerebral artery with abrupt occlusion of the right posterior cerebral artery proximal to the distal branches.  Moderate to marked narrowing at the bifurcation of the left posterior cerebral artery.  Mild to moderate narrowing superior cerebellar arteries bilaterally.  No aneurysm or vascular malformation is noted.  IMPRESSION: Intracranial atherosclerotic type changes as detailed above.  Most significant abnormality is that involving the posterior cerebral arteries (more notable on the right).  Decreased number of visualized left middle cerebral artery  branch vessels consistent with the patient's acute left middle cerebral artery distribution infarct.  No significant stenosis of the M1 segment of the left middle cerebral artery.  Narrowing of the supraclinoid aspect of the left internal carotid artery.  Please see above.  Critical Value/emergent results were called by telephone at the time of interpretation on 04/15/2012 at 11:42 a.m. to [facility] the patient's nurse, who verbally acknowledged these results.   Original Report Authenticated By: Lacy Duverney, M.D.    Mr Brain Wo Contrast  04/15/2012  *RADIOLOGY REPORT*  Clinical Data:  Atrial fibrillation.  Valve replacement.  Diabetic hypertensive patient.  Developed slurred speech and right arm weakness.  MRI BRAIN WITHOUT CONTRAST MRA HEAD WITHOUT CONTRAST  Technique: Multiplanar, multiecho pulse sequences of the brain and surrounding structures were obtained according to standard protocol without intravenous contrast.  Angiographic  images of the head were obtained using MRA technique without contrast.  Comparison: 04/14/2012 head CT.  No comparison brain MR.  MRI HEAD  Findings:  Motion degraded exam.  Scattered left hemispheric/middle cerebral artery distribution infarcts involving portions of the left insula/periopercular region and left frontal lobe.  Punctate blood breakdown products posterior left frontal lobe is not associated with acute infarct and may be related to the prior tiny area of hemorrhagic ischemia.  No other areas of intracranial hemorrhage.  Small vessel disease type changes most notable left periventricular region.  Mild atrophy without hydrocephalus.  No intracranial mass lesion detected on this unenhanced exam.  Retention cyst left maxillary sinus.  Minimal opacification anterior left ethmoid sinus.  Minimal exophthalmos.  IMPRESSION: Scattered left hemispheric/middle cerebral artery distribution infarcts involving portions of the left insula/periopercular region and left frontal lobe.  Please see above.  MRA HEAD  Findings: Ectatic cavernous and supraclinoid segment of the internal carotid artery bilaterally with areas of mild narrowing (most notable left supraclinoid internal carotid artery just proximal to the carotid terminus).  Mild to moderate narrowing of the A1 segment of the anterior cerebral artery bilaterally.  Middle cerebral artery branch vessel irregularity bilaterally. Decreased number of visualized left middle cerebral artery branches consistent with the patient's acute infarct.  M1 segment of the left middle cerebral artery does not appear significantly narrowed.  Right vertebral artery is dominant.  Narrowing of the distal left vertebral artery.  Mild irregularity of the PICA bilaterally.  Mild narrowing mid aspect of the basilar artery.  Nonvisualization AICAs.  High-grade stenosis proximal right posterior cerebral artery with abrupt occlusion of the right posterior cerebral artery proximal to the  distal branches.  Moderate to marked narrowing at the bifurcation of the left posterior cerebral artery.  Mild to moderate narrowing superior cerebellar arteries bilaterally.  No aneurysm or vascular malformation is noted.  IMPRESSION: Intracranial atherosclerotic type changes as detailed above.  Most significant abnormality is that involving the posterior cerebral arteries (more notable on the right).  Decreased number of visualized left middle cerebral artery branch vessels consistent with the patient's acute left middle cerebral artery distribution infarct.  No significant stenosis of the M1 segment of the left middle cerebral artery.  Narrowing of the supraclinoid aspect of the left internal carotid artery.  Please see above.  Critical Value/emergent results were called by telephone at the time of interpretation on 04/15/2012 at 11:42 a.m. to [facility] the patient's nurse, who verbally acknowledged these results.   Original Report Authenticated By: Lacy Duverney, M.D.    Dg Swallowing Func-speech Pathology  04/15/2012  Neldon Labella Dankof, CCC-SLP  04/15/2012  2:51 PM Objective Swallowing Evaluation: Bedside swallow evaluation  Patient Details  Name: NAYLIN BURKLE MRN: [id number] Date of Birth: 1955/02/02  Today's Date: 04/15/2012 Time: 1120-1140 SLP Time Calculation (min): 20 min  Past Medical History:  Past Medical History  Diagnosis Date  . Asthma   . Hypertension   . Obesity   . Chronic diastolic congestive heart failure   . Carotid bruit   . Hypercholesterolemia   . Polyp of rectum   . Abnormal chest CT   . Persistent atrial fibrillation   . Bronchospasm 1998  . Atrial enlargement, left     severe  . Heart murmur   . Sleep apnea     DOES NOT HAVE CPAP  . Diabetes mellitus     insulin dependent  . Mitral regurgitation   . Tricuspid regurgitation 01/16/2012  . Rheumatic fever 01/16/2012    Reported during childhood  . Atrial fibrillation 01/05/2011    Chronic persistent, failed DCCV   . Obesity (BMI 30-39.9) 01/16/2012  .  Shortness of breath     with exertion  . History of blood transfusion   . 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 S/P Maze operation for atrial fibrillation 02/15/2012    Complete biatrial lesion set using bipolar radiofrequency and  cryothermy ablation with clipping of LA appendage   Past Surgical History:  Past Surgical History  Procedure Laterality Date  . Cardiovascular stress test  09/2010  . Cardiac catheterization  >5 years  . Cardioversion  03/25/2011    Procedure: CARDIOVERSION;  Surgeon: Corky Crafts, MD;   Location: [facility] OR;  Service: Cardiovascular;  Laterality: N/A;  . Tee without cardioversion  12/21/2011    Procedure: TRANSESOPHAGEAL ECHOCARDIOGRAM (TEE);  Surgeon:  Corky Crafts, MD;  Location: [facility] ENDOSCOPY;  Service:  Cardiovascular;  Laterality: N/A;  . No past surgeries    . Tricuspid valve replacement  02/15/2012    Procedure: TRICUSPID VALVE REPAIR;  Surgeon: Purcell Nails,  MD;  Location: [facility] OR;  Service: Open Heart Surgery;  Laterality:  N/A;  . Maze  02/15/2012    Procedure: MAZE;  Surgeon: Purcell Nails, MD;  Location: [facility]  OR;  Service: Open Heart Surgery;  Laterality: N/A;  . Intraoperative transesophageal echocardiogram  02/15/2012    Procedure: INTRAOPERATIVE TRANSESOPHAGEAL ECHOCARDIOGRAM;   Surgeon: Purcell Nails, MD;  Location: [facility] OR;  Service: Open  Heart Surgery;  Laterality: N/A;  . Mitral valve replacement  02/15/2012    Procedure: MITRAL VALVE (MV) REPLACEMENT;  Surgeon: Purcell Nails, MD;  Location: [facility] OR;  Service: Open Heart Surgery;   Laterality: N/A;   HPI:    [name] is a 57 y.o. 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  Probably  last time she was seen normal was at 8: 50. She was  found by her son sitting in the couch, weak, unable to speak.  Patient was initially a code stroke, patient was not candidate  for TPA due to elevated INR at 1.8. During my evaluation patient  has expressive and dysarthric aphasia. She thought that her  daughter was her sister. MRI: Scattered  left hemispheric/middle  cerebral artery distribution, infarcts involving portions of the  left insula/peripercular region and left frontal lobe.  Objective  assessment of MBS indicated due to results of BSE to assess risk  for aspiration.      Assessment / Plan / Recommendation Clinical Impression  Dysphagia Diagnosis: Mild oral phase dysphagia;Mild pharyngeal  phase dysphagia;Moderate cervical esophageal phase dysphagia Mild sensory motor oral dysphagia marked by decreased lingual  strength and coordination resulting in delay in oral transit,  oral holding, and piecemeal swallows.   Mild pharyngeal dysphagia  with deep penetration to cords initial swallow of thin liquid  barium by cup due to incomplete airway closure.  Patient sensed  penetration with subsequent effective throat clear.   Pharyngeal  swallow improved as evaluation proceeded as only one instance of  penetration occurring out of multiple trials of thin.  Minimal  pharyngeal residue at CP segment s/p swallow of puree, thin and  solid trials.  Brief esophageal screen indicates stasis in  thoracic esophagus with backflow of puree and thin liquids to  cervical esophagus.  Incomplete transit with whole barium tablet  with stasis in thoracic esophagus.  Cued dry swallows and sips of  thin liquid ineffective in completing transit.  Recommend to  proceed with Dysphagia 3 diet consistency with thin liquids with  full supervision due to cognitive status.  Recommend modified  diet mainly due to oral phase dysphagia.  ST to follow in acute  care setting for diet tolerance and possible advancement.  No  repeat MBS  warranted at this time for diet advancement.  Patient  may benefit from GI consult to assess esophageal functioning.     Treatment Recommendation  Therapy as outlined in treatment plan below    Diet Recommendation Dysphagia 3 (Mechanical Soft);Thin liquid   Liquid Administration via: Cup;Straw Medication Administration: Crushed with puree Supervision: Patient able to self feed;Full supervision/cueing  for compensatory strategies Compensations: Slow rate;Small sips/bites;Check for  pocketing;Multiple dry swallows after each bite/sip Postural Changes and/or Swallow Maneuvers: Out of bed for  meals;Seated upright 90 degrees;Upright 30-60 min after meal    Other  Recommendations Recommended Consults: Consider GI  evaluation Oral Care Recommendations: Oral care QID   Follow Up Recommendations  Outpatient SLP    Frequency and Duration min 2x/week  2 weeks       SLP Swallow Goals Patient will utilize recommended strategies during swallow to  increase swallowing safety with: Modified independent assistance   General Date of Onset: 04/14/12 Type of Study: Bedside swallow evaluation Reason for Referral: Objectively evaluate swallowing function Diet Prior to this Study: NPO Temperature Spikes Noted: No Respiratory Status: Room air History of Recent Intubation: No Behavior/Cognition: Alert;Cooperative;Pleasant mood;Requires  cueing Oral Cavity - Dentition: Adequate natural dentition Oral Motor / Sensory Function: Impaired - see Bedside swallow  eval Self-Feeding Abilities: Needs assist;Able to feed self Patient Positioning: Upright in chair Baseline Vocal Quality: Hoarse;Low vocal intensity Volitional Cough: Strong Volitional Swallow: Able to elicit Anatomy: Within functional limits Pharyngeal Secretions: Not observed secondary MBS    Reason for Referral Objectively evaluate swallowing function   Oral Phase Oral Preparation/Oral Phase Oral Phase: Impaired Oral - Nectar Oral - Nectar Cup: Weak lingual manipulation;Holding of   bolus;Piecemeal swallowing;Lingual/palatal residue;Delayed oral  transit Oral - Thin Oral - Thin Cup: Weak lingual manipulation;Delayed oral  transit;Lingual/palatal residue;Piecemeal swallowing;Holding of  bolus;Reduced posterior propulsion Oral - Solids Oral - Puree: Weak lingual manipulation;Reduced posterior  propulsion;Holding of bolus;Piecemeal swallowing;Lingual/palatal  residue;Delayed oral transit Oral -  Regular: Impaired mastication;Weak lingual  manipulation;Incomplete tongue to palate contact;Holding of  bolus;Piecemeal swallowing;Lingual/palatal residue;Delayed oral  transit Oral - Pill: Weak lingual manipulation;Holding of bolus;Delayed  oral transit   Pharyngeal Phase Pharyngeal Phase Pharyngeal Phase: Impaired Pharyngeal - Nectar Pharyngeal - Nectar Cup: Premature spillage to valleculae;Reduced  pharyngeal peristalsis;Reduced airway/laryngeal closure;Reduced  tongue base retraction;Pharyngeal residue - cp segment Pharyngeal - Thin Pharyngeal - Thin Cup: Premature spillage to valleculae;Reduced  airway/laryngeal closure;Penetration/Aspiration before  swallow;Penetration/Aspiration during swallow;Trace  aspiration;Pharyngeal residue - cp segment Penetration/Aspiration details (thin cup): Material enters  airway, CONTACTS cords then ejected out Pharyngeal - Thin Straw: Premature spillage to valleculae;Reduced  pharyngeal peristalsis;Reduced tongue base retraction;Reduced  airway/laryngeal closure Pharyngeal - Solids Pharyngeal - Puree: Premature spillage to valleculae;Reduced  tongue base retraction;Pharyngeal residue - cp segment;Pharyngeal  residue - pyriform sinuses Pharyngeal - Regular: Premature spillage to valleculae;Reduced  tongue base retraction;Pharyngeal residue - cp segment Pharyngeal - Pill: Delayed swallow initiation  Cervical Esophageal Phase    GO    Cervical Esophageal Phase Cervical Esophageal Phase: Impaired Cervical Esophageal Phase - Thin Thin Cup: Esophageal backflow into cervical  esophagus;Prominent  cricopharyngeal segment;Reduced cricopharyngeal relaxation Cervical Esophageal Phase - Solids Regular: Reduced cricopharyngeal relaxation;Prominent  cricopharyngeal segment;Esophageal backflow into cervical  esophagus Pill: Esophageal backflow into cervical esophagus;Reduced  cricopharyngeal relaxation;Prominent cricopharyngeal segment        Moreen Fowler MS, CCC-SLP [phone] [facility] 04/15/2012, 2:32 PM        ASSESSMENT: CVA s/p mechanical MVR, TV repair; atrial flutter on tele per EP  PLAN:  Target INR 2.5-3.0 given mitral valve replacement and CVA.  Valve sounds normal on exam.  Continue heparin today as INR may drop slightly when heparin stopped.  2.56 today.  Rate control for AFlutter at this time.  Continue metoprolol.  Use calcium channel blocker if BP becomes significantly elevated.  Would have to be anticoagulated for a month prior to any attempt at cardioversion.  Cardiomyopathy diagnosed by echo.  Likely related to MVR and loss of low resistance outflow (mitral regurgitation).  Evidence of fluid overload on echo.  WIll diurese with lasix to avoid CHF sx.   Corky Crafts., MD  04/17/2012  9:57 AM

## 2012-04-17 NOTE — Progress Notes (Signed)
Physical Therapy Discharge Patient Details Name: [name] MRN: [id number] DOB: June 22, 1955 Today's Date: 04/17/2012 Time: 8295-6213 PT Time Calculation (min): 24 min  Patient discharged from PT services secondary to goals met and no further PT needs identified.  Please see latest therapy progress note for current level of functioning and progress toward goals.    Progress and discharge plan discussed with patient and/or caregiver: Patient/Caregiver agrees with plan  GP     Greggory Stallion 04/17/2012, 9:07 AM

## 2012-04-17 NOTE — Progress Notes (Signed)
Patient ID: [name]  female  GEX:[id number]    DOB: 1955/03/09    DOA: 04/14/2012  PCP: Hulda Humphrey, NP  Assessment/Plan: Principal Problem:   CVA (cerebral infarction) Active Problems:   Hypertension   Diabetes mellitus   Atrial fibrillation   CHF (congestive heart failure)   S/P mitral valve replacement with metallic valve  Acute CVA - Scattered left hemispheric/middle cerebral artery distribution infarcts involving left insula/periopercular region and left frontal lobe - MRI of the brain showed scattered left hemispheric/MCA distribution infarcts involving portions of the left insula/opercular region and left frontal lobe  - MRA brain showed intracranial atherosclerotic type changes, decreased number of visualized left MCA artery branch vessels - 2-D echo showed EF of 20-25% with no source of embolus, diffuse hypokinesis - Carotid Doppler in 2/14 were normal  Hypertension - stable  Diabetes mellitus  Poorly controlled w/ HbA1c of 8.7 - CBG controlled as inpatient at this time   Hx of Chronic Atrial fibrillation / atypical flutter  Care as per Cardiology - S/p MAZE at time of valve surgery   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 -  Cardiology is following, on BB and ARB, given IV Lasix today   Obesity  HLD  Stroke Team has adjusted medical tx in response to elevated LDL   DVT Prophylaxis:  Code Status:  Disposition: Tomorrow if INR stays above 2.5    Subjective: No specific complaints, daughter and granddaughter at bed  side  Objective: Weight change:   Intake/Output Summary (Last 24 hours) at 04/17/12 1612 Last data filed at 04/17/12 0700  Gross per 24 hour  Intake    120 ml  Output      0 ml  Net    120 ml   Blood pressure 111/88, pulse 80, temperature 97.9 F (36.6 C), temperature source Oral, resp. rate 18, height 5\' 6"  (1.676 m), weight 98.4 kg (216 lb 14.9 oz), SpO2 95.00%.  Physical Exam: General: Alert and awake, oriented x3, not in any acute distress. HEENT: anicteric sclera, pupils reactive to light and accommodation, EOMI CVS: S1-S2 clear, no murmur rubs or gallops Chest: clear to auscultation bilaterally, no wheezing, rales or rhonchi Abdomen: soft nontender, nondistended, normal bowel sounds, no organomegaly Extremities: no cyanosis, clubbing or edema noted bilaterally Neuro: Cranial nerves II-XII intact, no focal neurological deficits  Lab Results: Basic Metabolic Panel:  Recent Labs Lab 04/14/12 1225 04/14/12 1240 04/16/12 0450  NA 137 143 137  K 3.2* 3.4* 4.2  CL 102 104 106  CO2 25  --  23  GLUCOSE 147* 152* 130*  BUN 10 9 14   CREATININE 0.76 0.80 0.79  CALCIUM 9.3  --  9.4  MG  --   --  2.2   Liver Function Tests:  Recent Labs Lab 04/14/12 1225  AST 27  ALT 20  ALKPHOS 123*  BILITOT 0.7  PROT 8.2  ALBUMIN 3.6   No results found for this basename: LIPASE, AMYLASE,  in the last 168 hours No results found for this  basename: AMMONIA,  in the last 168 hours CBC:  Recent Labs Lab 04/14/12 1225  04/16/12 0450 04/17/12 0500  WBC 5.9  < > 6.5 6.8  NEUTROABS 3.7  --   --   --   HGB 11.3*  < > 11.0* 10.8*  HCT 34.5*  < > 33.8* 33.2*  MCV 87.3  < > 87.8 88.1  PLT 195  < > 207 210  < > = values in this interval not displayed. Cardiac Enzymes:  Recent Labs Lab 04/14/12 1225  TROPONINI <0.30   BNP: No components found with this basename: POCBNP,  CBG:  Recent Labs Lab 04/16/12 1150 04/16/12 1637 04/16/12 2139 04/17/12 0741 04/17/12 1159   GLUCAP 131* 116* 102* 109* 173*     Micro Results: Recent Results (from the past 240 hour(s))  MRSA PCR SCREENING     Status: None   Collection Time    04/14/12  5:40 PM      Result Value Range Status   MRSA by PCR NEGATIVE  NEGATIVE Final   Comment:            The GeneXpert MRSA Assay (FDA     approved for NASAL specimens     only), is one component of a     comprehensive MRSA colonization     surveillance program. It is not     intended to diagnose MRSA     infection nor to guide or     monitor treatment for     MRSA infections.    Studies/Results: Dg Chest 2 View  04/15/2012  *RADIOLOGY REPORT*  Clinical Data: History of stroke.  CHEST - 2 VIEW  Comparison: 03/19/2012  Findings: Patient has had median sternotomy and valve replacement. Heart is enlarged.  There is diffuse, interstitial pulmonary edema. No focal consolidations or definite pleural effusions are identified.  IMPRESSION: Cardiomegaly and interstitial edema.   Original Report Authenticated By: Norva Pavlov, M.D.    Dg Chest 2 View  03/19/2012  *RADIOLOGY REPORT*  Clinical Data: History of heart surgery.  CHEST - 2 VIEW  Comparison: 02/24/2012.  Findings: Trachea is midline.  Heart is enlarged, stable.  Right PICC has been removed.  Lungs are somewhat low in volume but clear. No pleural fluid.  IMPRESSION: Low lung volumes without acute finding.   Original Report Authenticated By: Leanna Battles, M.D.    Ct Head (brain) Wo Contrast  04/14/2012  *RADIOLOGY REPORT*  Clinical Data: Right-sided facial droop, right-sided weakness and lethargy.  CT HEAD WITHOUT CONTRAST  Technique:  Contiguous axial images were obtained from the base of the skull through the vertex without contrast.  Comparison: None.  Findings: The brain demonstrates no evidence of hemorrhage, infarction, edema, mass effect, extra-axial fluid collection, hydrocephalus or mass lesion.  The skull is unremarkable.  IMPRESSION: No acute findings.   Original  Report Authenticated By: Irish Lack, M.D.    Mr [facility] Wo Contrast  04/15/2012  *RADIOLOGY REPORT*  Clinical Data:  Atrial fibrillation.  Valve replacement.  Diabetic hypertensive patient.  Developed slurred speech and right arm weakness.  MRI BRAIN WITHOUT CONTRAST MRA HEAD WITHOUT CONTRAST  Technique: Multiplanar, multiecho pulse sequences of the brain and surrounding structures were obtained according to standard protocol without intravenous contrast.  Angiographic images of the head were obtained using MRA technique without contrast.  Comparison: 04/14/2012 head CT.  No comparison brain MR.  MRI HEAD  Findings:  Motion degraded exam.  Scattered left hemispheric/middle cerebral artery distribution infarcts involving  portions of the left insula/periopercular region and left frontal lobe.  Punctate blood breakdown products posterior left frontal lobe is not associated with acute infarct and may be related to the prior tiny area of hemorrhagic ischemia.  No other areas of intracranial hemorrhage.  Small vessel disease type changes most notable left periventricular region.  Mild atrophy without hydrocephalus.  No intracranial mass lesion detected on this unenhanced exam.  Retention cyst left maxillary sinus.  Minimal opacification anterior left ethmoid sinus.  Minimal exophthalmos.  IMPRESSION: Scattered left hemispheric/middle cerebral artery distribution infarcts involving portions of the left insula/periopercular region and left frontal lobe.  Please see above.  MRA HEAD  Findings: Ectatic cavernous and supraclinoid segment of the internal carotid artery bilaterally with areas of mild narrowing (most notable left supraclinoid internal carotid artery just proximal to the carotid terminus).  Mild to moderate narrowing of the A1 segment of the anterior cerebral artery bilaterally.  Middle cerebral artery branch vessel irregularity bilaterally. Decreased number of visualized left middle cerebral artery  branches consistent with the patient's acute infarct.  M1 segment of the left middle cerebral artery does not appear significantly narrowed.  Right vertebral artery is dominant.  Narrowing of the distal left vertebral artery.  Mild irregularity of the PICA bilaterally.  Mild narrowing mid aspect of the basilar artery.  Nonvisualization AICAs.  High-grade stenosis proximal right posterior cerebral artery with abrupt occlusion of the right posterior cerebral artery proximal to the distal branches.  Moderate to marked narrowing at the bifurcation of the left posterior cerebral artery.  Mild to moderate narrowing superior cerebellar arteries bilaterally.  No aneurysm or vascular malformation is noted.  IMPRESSION: Intracranial atherosclerotic type changes as detailed above.  Most significant abnormality is that involving the posterior cerebral arteries (more notable on the right).  Decreased number of visualized left middle cerebral artery branch vessels consistent with the patient's acute left middle cerebral artery distribution infarct.  No significant stenosis of the M1 segment of the left middle cerebral artery.  Narrowing of the supraclinoid aspect of the left internal carotid artery.  Please see above.  Critical Value/emergent results were called by telephone at the time of interpretation on 04/15/2012 at 11:42 a.m. to [facility] the patient's nurse, who verbally acknowledged these results.   Original Report Authenticated By: Lacy Duverney, M.D.    Mr Brain Wo Contrast  04/15/2012  *RADIOLOGY REPORT*  Clinical Data:  Atrial fibrillation.  Valve replacement.  Diabetic hypertensive patient.  Developed slurred speech and right arm weakness.  MRI BRAIN WITHOUT CONTRAST MRA HEAD WITHOUT CONTRAST  Technique: Multiplanar, multiecho pulse sequences of the brain and surrounding structures were obtained according to standard protocol without intravenous contrast.  Angiographic images of the head were obtained using MRA  technique without contrast.  Comparison: 04/14/2012 head CT.  No comparison brain MR.  MRI HEAD  Findings:  Motion degraded exam.  Scattered left hemispheric/middle cerebral artery distribution infarcts involving portions of the left insula/periopercular region and left frontal lobe.  Punctate blood breakdown products posterior left frontal lobe is not associated with acute infarct and may be related to the prior tiny area of hemorrhagic ischemia.  No other areas of intracranial hemorrhage.  Small vessel disease type changes most notable left periventricular region.  Mild atrophy without hydrocephalus.  No intracranial mass lesion detected on this unenhanced exam.  Retention cyst left maxillary sinus.  Minimal opacification anterior left ethmoid sinus.  Minimal exophthalmos.  IMPRESSION: Scattered left hemispheric/middle cerebral artery distribution infarcts involving portions of  the left insula/periopercular region and left frontal lobe.  Please see above.  MRA HEAD  Findings: Ectatic cavernous and supraclinoid segment of the internal carotid artery bilaterally with areas of mild narrowing (most notable left supraclinoid internal carotid artery just proximal to the carotid terminus).  Mild to moderate narrowing of the A1 segment of the anterior cerebral artery bilaterally.  Middle cerebral artery branch vessel irregularity bilaterally. Decreased number of visualized left middle cerebral artery branches consistent with the patient's acute infarct.  M1 segment of the left middle cerebral artery does not appear significantly narrowed.  Right vertebral artery is dominant.  Narrowing of the distal left vertebral artery.  Mild irregularity of the PICA bilaterally.  Mild narrowing mid aspect of the basilar artery.  Nonvisualization AICAs.  High-grade stenosis proximal right posterior cerebral artery with abrupt occlusion of the right posterior cerebral artery proximal to the distal branches.  Moderate to marked narrowing  at the bifurcation of the left posterior cerebral artery.  Mild to moderate narrowing superior cerebellar arteries bilaterally.  No aneurysm or vascular malformation is noted.  IMPRESSION: Intracranial atherosclerotic type changes as detailed above.  Most significant abnormality is that involving the posterior cerebral arteries (more notable on the right).  Decreased number of visualized left middle cerebral artery branch vessels consistent with the patient's acute left middle cerebral artery distribution infarct.  No significant stenosis of the M1 segment of the left middle cerebral artery.  Narrowing of the supraclinoid aspect of the left internal carotid artery.  Please see above.  Critical Value/emergent results were called by telephone at the time of interpretation on 04/15/2012 at 11:42 a.m. to [facility] the patient's nurse, who verbally acknowledged these results.   Original Report Authenticated By: Lacy Duverney, M.D.    Dg Swallowing Func-speech Pathology  04/15/2012  Lacinda Axon, CCC-SLP     04/15/2012  2:51 PM Objective Swallowing Evaluation: Bedside swallow evaluation  Patient Details  Name: [name] MRN: [id number] Date of Birth: 12-15-1955  Today's Date: 04/15/2012 Time: 1120-1140 SLP Time Calculation (min): 20 min  Past Medical History:  Past Medical History  Diagnosis Date  . Asthma   . Hypertension   . Obesity   . Chronic diastolic congestive heart failure   . Carotid bruit   . Hypercholesterolemia   . Polyp of rectum   . Abnormal chest CT   . Persistent atrial fibrillation   . Bronchospasm 1998  . Atrial enlargement, left     severe  . Heart murmur   . Sleep apnea     DOES NOT HAVE CPAP  . Diabetes mellitus     insulin dependent  . Mitral regurgitation   . Tricuspid regurgitation 01/16/2012  . Rheumatic fever 01/16/2012    Reported during childhood  . Atrial fibrillation 01/05/2011    Chronic persistent, failed DCCV   . Obesity (BMI 30-39.9) 01/16/2012  . Shortness of breath     with exertion  . History  of blood transfusion   . 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 S/P Maze operation for atrial fibrillation 02/15/2012    Complete biatrial lesion set using bipolar radiofrequency and  cryothermy ablation with clipping of LA appendage   Past Surgical History:  Past Surgical History  Procedure Laterality Date  . Cardiovascular stress test  09/2010  . Cardiac catheterization  >5 years  .  Cardioversion  03/25/2011    Procedure: CARDIOVERSION;  Surgeon: Corky Crafts, MD;   Location: [facility] OR;  Service: Cardiovascular;  Laterality: N/A;  . Tee without cardioversion  12/21/2011    Procedure: TRANSESOPHAGEAL ECHOCARDIOGRAM (TEE);  Surgeon:  Corky Crafts, MD;  Location: [facility] ENDOSCOPY;  Service:  Cardiovascular;  Laterality: N/A;  . No past surgeries    . Tricuspid valve replacement  02/15/2012    Procedure: TRICUSPID VALVE REPAIR;  Surgeon: Purcell Nails,  MD;  Location: [facility] OR;  Service: Open Heart Surgery;  Laterality:  N/A;  . Maze  02/15/2012    Procedure: MAZE;  Surgeon: Purcell Nails, MD;  Location: [facility]  OR;  Service: Open Heart Surgery;  Laterality: N/A;  . Intraoperative transesophageal echocardiogram  02/15/2012    Procedure: INTRAOPERATIVE TRANSESOPHAGEAL ECHOCARDIOGRAM;   Surgeon: Purcell Nails, MD;  Location: [facility] OR;  Service: Open  Heart Surgery;  Laterality: N/A;  . Mitral valve replacement  02/15/2012    Procedure: MITRAL VALVE (MV) REPLACEMENT;  Surgeon: Purcell Nails, MD;  Location: [facility] OR;  Service: Open Heart Surgery;   Laterality: N/A;   HPI:    [name] is a 57 y.o. 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  Probably last time she was seen normal was at 8: 50. She was   found by her son sitting in the couch, weak, unable to speak.  Patient was initially a code stroke, patient was not candidate  for TPA due to elevated INR at 1.8. During my evaluation patient  has expressive and dysarthric aphasia. She thought that her  daughter was her sister. MRI: Scattered left hemispheric/middle  cerebral artery distribution, infarcts involving portions of the  left insula/peripercular region and left frontal lobe.  Objective  assessment of MBS indicated due to results of BSE to assess risk  for aspiration.      Assessment / Plan / Recommendation Clinical Impression  Dysphagia Diagnosis: Mild oral phase dysphagia;Mild pharyngeal  phase dysphagia;Moderate cervical esophageal phase dysphagia Mild sensory motor oral dysphagia marked by decreased lingual  strength and coordination resulting in delay in oral transit,  oral holding, and piecemeal swallows.   Mild pharyngeal dysphagia  with deep penetration to cords initial swallow of thin liquid  barium by cup due to incomplete airway closure.  Patient sensed  penetration with subsequent effective throat clear.   Pharyngeal  swallow improved as evaluation proceeded as only one instance of  penetration occurring out of multiple trials of thin.  Minimal  pharyngeal residue at CP segment s/p swallow of puree, thin and  solid trials.  Brief esophageal screen indicates stasis in  thoracic esophagus with backflow of puree and thin liquids to  cervical esophagus.  Incomplete transit with whole barium tablet  with stasis in thoracic esophagus.  Cued dry swallows and sips of  thin liquid ineffective in completing transit.  Recommend to  proceed with Dysphagia 3 diet consistency with thin liquids with  full supervision due to cognitive status.  Recommend modified  diet mainly due to oral phase dysphagia.  ST to follow in acute  care setting for diet tolerance and possible advancement.  No  repeat MBS warranted at this time for diet advancement.  Patient  may  benefit from GI consult to assess esophageal functioning.  Treatment Recommendation  Therapy as outlined in treatment plan below    Diet Recommendation Dysphagia 3 (Mechanical Soft);Thin liquid   Liquid Administration via: Cup;Straw Medication Administration: Crushed with puree Supervision: Patient able to self feed;Full supervision/cueing  for compensatory strategies Compensations: Slow rate;Small sips/bites;Check for  pocketing;Multiple dry swallows after each bite/sip Postural Changes and/or Swallow Maneuvers: Out of bed for  meals;Seated upright 90 degrees;Upright 30-60 min after meal    Other  Recommendations Recommended Consults: Consider GI  evaluation Oral Care Recommendations: Oral care QID   Follow Up Recommendations  Outpatient SLP    Frequency and Duration min 2x/week  2 weeks       SLP Swallow Goals Patient will utilize recommended strategies during swallow to  increase swallowing safety with: Modified independent assistance   General Date of Onset: 04/14/12 Type of Study: Bedside swallow evaluation Reason for Referral: Objectively evaluate swallowing function Diet Prior to this Study: NPO Temperature Spikes Noted: No Respiratory Status: Room air History of Recent Intubation: No Behavior/Cognition: Alert;Cooperative;Pleasant mood;Requires  cueing Oral Cavity - Dentition: Adequate natural dentition Oral Motor / Sensory Function: Impaired - see Bedside swallow  eval Self-Feeding Abilities: Needs assist;Able to feed self Patient Positioning: Upright in chair Baseline Vocal Quality: Hoarse;Low vocal intensity Volitional Cough: Strong Volitional Swallow: Able to elicit Anatomy: Within functional limits Pharyngeal Secretions: Not observed secondary MBS    Reason for Referral Objectively evaluate swallowing function   Oral Phase Oral Preparation/Oral Phase Oral Phase: Impaired Oral - Nectar Oral - Nectar Cup: Weak lingual manipulation;Holding of  bolus;Piecemeal swallowing;Lingual/palatal residue;Delayed  oral  transit Oral - Thin Oral - Thin Cup: Weak lingual manipulation;Delayed oral  transit;Lingual/palatal residue;Piecemeal swallowing;Holding of  bolus;Reduced posterior propulsion Oral - Solids Oral - Puree: Weak lingual manipulation;Reduced posterior  propulsion;Holding of bolus;Piecemeal swallowing;Lingual/palatal  residue;Delayed oral transit Oral - Regular: Impaired mastication;Weak lingual  manipulation;Incomplete tongue to palate contact;Holding of  bolus;Piecemeal swallowing;Lingual/palatal residue;Delayed oral  transit Oral - Pill: Weak lingual manipulation;Holding of bolus;Delayed  oral transit   Pharyngeal Phase Pharyngeal Phase Pharyngeal Phase: Impaired Pharyngeal - Nectar Pharyngeal - Nectar Cup: Premature spillage to valleculae;Reduced  pharyngeal peristalsis;Reduced airway/laryngeal closure;Reduced  tongue base retraction;Pharyngeal residue - cp segment Pharyngeal - Thin Pharyngeal - Thin Cup: Premature spillage to valleculae;Reduced  airway/laryngeal closure;Penetration/Aspiration before  swallow;Penetration/Aspiration during swallow;Trace  aspiration;Pharyngeal residue - cp segment Penetration/Aspiration details (thin cup): Material enters  airway, CONTACTS cords then ejected out Pharyngeal - Thin Straw: Premature spillage to valleculae;Reduced  pharyngeal peristalsis;Reduced tongue base retraction;Reduced  airway/laryngeal closure Pharyngeal - Solids Pharyngeal - Puree: Premature spillage to valleculae;Reduced  tongue base retraction;Pharyngeal residue - cp segment;Pharyngeal  residue - pyriform sinuses Pharyngeal - Regular: Premature spillage to valleculae;Reduced  tongue base retraction;Pharyngeal residue - cp segment Pharyngeal - Pill: Delayed swallow initiation  Cervical Esophageal Phase    GO    Cervical Esophageal Phase Cervical Esophageal Phase: Impaired Cervical Esophageal Phase - Thin Thin Cup: Esophageal backflow into cervical esophagus;Prominent  cricopharyngeal segment;Reduced  cricopharyngeal relaxation Cervical Esophageal Phase - Solids Regular: Reduced cricopharyngeal relaxation;Prominent  cricopharyngeal segment;Esophageal backflow into cervical  esophagus Pill: Esophageal backflow into cervical esophagus;Reduced  cricopharyngeal relaxation;Prominent cricopharyngeal segment        Moreen Fowler MS, CCC-SLP [phone] [facility] 04/15/2012, 2:32 PM      Medications: Scheduled Meds: . atorvastatin  80 mg Oral QPM  . insulin aspart  0-9 Units Subcutaneous TID WC  . insulin glargine  10 Units Subcutaneous QHS  . irbesartan  300 mg Oral Daily  . metoprolol tartrate  50 mg Oral BID  . potassium chloride  20 mEq Oral Daily  . warfarin  4 mg Oral ONCE-1800  . Warfarin - Pharmacist Dosing Inpatient   Does not apply q1800      LOS: 3 days   Aasha Dina M.D. [facility] 04/17/2012, 4:12 PM Pager: [phone]  If 7PM-7AM, please contact night-coverage [email or web address] Password [email or web address]

## 2012-04-17 NOTE — Progress Notes (Signed)
   CARE MANAGEMENT NOTE 04/17/2012  Patient:  [name], [name]   Account Number:  [id number]  Date Initiated:  04/17/2012  Documentation initiated by:  [facility]  Subjective/Objective Assessment:   CVA     Action/Plan:   Anticipated DC Date:  04/18/2012   Anticipated DC Plan:  HOME W HOME HEALTH SERVICES      DC Planning Services  CM consult  OP Neuro Rehab      Choice offered to / List presented to:             Status of service:  Completed, signed off Medicare Important Message given?   (If response is "NO", the following Medicare IM given date fields will be blank) Date Medicare IM given:   Date Additional Medicare IM given:    Discharge Disposition:  HOME/SELF CARE  Per UR Regulation:    If discussed at Long Length of Stay Meetings, dates discussed:    Comments:  04/17/2012 1600 NCM spoke to pt and provided directions to [facility] for SLP. Faxed referral to [facility] for SLP. Isidoro Donning RN CCM Case Mgmt phone [phone]

## 2012-04-17 NOTE — Progress Notes (Signed)
Stroke Team Progress Note  HISTORY [name] is an 57 y.o. 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 Patient was last known well at 9 AM today. Has no previous history of stroke nor TIA. Patient has been on Coumadin for anticoagulation. INR was 1.84 today. As such she was not deemed a candidate for intravenous thrombolytic therapy with TPA. NIH stroke score was 8. She was admitted to the neuro stepdown unit for further evaluation and treatment.  SUBJECTIVE Patient in chair at bedside, daughter and granddaughter at bedside.  OBJECTIVE Most recent Vital Signs: Filed Vitals:   04/16/12 1630 04/16/12 2137 04/17/12 0252 04/17/12 0744  BP: 121/91 129/91 132/87 114/89  Pulse: 106 106 107 83  Temp: 97.4 F (36.3 C) 97.9 F (36.6 C) 97.9 F (36.6 C) 97.6 F (36.4 C)  TempSrc: Oral Oral Oral Oral  Resp: 17 16 16 16   Height:      Weight:      SpO2: 94% 100% 93% 100%   CBG (last 3)   Recent Labs  04/16/12 1150 04/16/12 1637 04/16/12 2139  GLUCAP 131* 116* 102*   IV Fluid Intake:   . sodium chloride 10 mL/hr at 04/17/12 0700  . heparin 1,250 Units/hr (04/17/12 0829)   MEDICATIONS  . atorvastatin  80 mg Oral QPM  . insulin aspart  0-9 Units Subcutaneous TID WC  . insulin glargine  10 Units Subcutaneous QHS  . irbesartan  300 mg Oral Daily  . metoprolol tartrate  50 mg Oral BID  . potassium chloride  20 mEq Oral Daily  . warfarin  4 mg Oral ONCE-1800  . Warfarin - Pharmacist Dosing Inpatient   Does not apply q1800   PRN:  acetaminophen, hydrALAZINE, metoprolol, senna-docusate  Diet:  Dysphagia  3 thin liquids Activity:  Up with assistance  DVT Prophylaxis:  Iv heparin, coumadin  CLINICALLY SIGNIFICANT STUDIES Basic Metabolic Panel:   Recent Labs Lab 04/14/12 1225 04/14/12 1240  04/16/12 0450  NA 137 143 137  K 3.2* 3.4* 4.2  CL 102 104 106  CO2 25  --  23  GLUCOSE 147* 152* 130*  BUN 10 9 14   CREATININE 0.76 0.80 0.79  CALCIUM 9.3  --  9.4  MG  --   --  2.2   Liver Function Tests:   Recent Labs Lab 04/14/12 1225  AST 27  ALT 20  ALKPHOS 123*  BILITOT 0.7  PROT 8.2  ALBUMIN 3.6   CBC:  Recent Labs Lab 04/14/12 1225  04/16/12 0450 04/17/12 0500  WBC 5.9  < > 6.5 6.8  NEUTROABS 3.7  --   --   --   HGB 11.3*  < > 11.0* 10.8*  HCT 34.5*  < > 33.8* 33.2*  MCV 87.3  < > 87.8 88.1  PLT 195  < > 207 210  < > = values in this interval not displayed. Coagulation:   Recent Labs Lab 04/14/12 1225 04/16/12 0450 04/17/12 0500  LABPROT 20.6* 22.3* 26.3*  INR 1.84* 2.05* 2.56*   Cardiac Enzymes:   Recent Labs Lab 04/14/12 1225  TROPONINI <0.30   Urinalysis: No results found for this basename: COLORURINE, APPERANCEUR, LABSPEC, PHURINE, GLUCOSEU, HGBUR, BILIRUBINUR, KETONESUR, PROTEINUR, UROBILINOGEN, NITRITE, LEUKOCYTESUR,  in the last 168 hours Lipid Panel    Component Value Date/Time   CHOL 169 04/15/2012 0530   TRIG  78 04/15/2012 0530   HDL 27* 04/15/2012 0530   CHOLHDL 6.3 04/15/2012 0530   VLDL 16 04/15/2012 0530   LDLCALC 126* 04/15/2012 0530   HgbA1C  Lab Results  Component Value Date   HGBA1C 6.9* 04/15/2012   Urine Drug Screen:   No results found for this basename: labopia,  cocainscrnur,  labbenz,  amphetmu,  thcu,  labbarb    Alcohol Level: No results found for this basename: ETH,  in the last 168 hours  CT Head 04/14/2012 No acute findings.     MRI of the brain  04/15/2012   Scattered left hemispheric/middle cerebral artery distribution infarcts involving portions of the left insula/periopercular region and left frontal lobe.    MRA of the brain  04/15/2012   Intracranial atherosclerotic type changes as detailed above.  Most significant abnormality is that involving the posterior cerebral arteries (more notable on the right).  Decreased  number of visualized left middle cerebral artery branch vessels consistent with the patient's acute left middle cerebral artery distribution infarct.  No significant stenosis of the M1 segment of the left middle cerebral artery.  Narrowing of the supraclinoid aspect of the left internal carotid artery.   2D Echocardiogram  EF 20-25% with no source of embolus. Diffuse hypokinesis.  Carotid Doppler  Normal 02/2012. No need to repeat  CXR  04/15/2012  Cardiomegaly and interstitial edema.    EKG  Sinus tachycardia. Left axis deviation. Left bundle branch block  Therapy Recommendations no PT needs  Physical Exam  Pleasant middle aged african american lady not in distress.Awake alert. Afebrile. Head is nontraumatic. Neck is supple without bruit. Hearing is normal. Cardiac exam no murmur or gallop but mechanical heart sounds heard.. Lungs are clear to auscultation. Distal pulses are well felt. Neurological exam ; Awake  Alert oriented x 3. Mildly nonfluentspeech and language.Good comprehension, naming and mildly impaired repitition..Eye movements full without nystagmus.Fundi were not visualized. Vision acuity and fields appear normal. Hearing is normal. Palatal movements are normal. Face symmetric. Tongue midline. Normal strength, tone, reflexes and coordination. Diminished fine finger movements on right and orbits left over right upper extremity.. Normal sensation. Gait deferred.    ASSESSMENT [name] is a 57 y.o. female presenting with  Aphasia and right sided weakness secondary to scattered infarcts in the left insula/periopercular and frontal lobe. 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  On warfarin prior to admission. Now on warfarin and heparin for secondary stroke prevention. Patient with resultant  mild expressive aphasia and right hand weakness. Work up completed.  Hyperlipidemia, LDL 126, on lipitor, goal LDL < 100 (<70 given  diabetes) Diabetes, HgbA1c 6.9  h/o persistent atrial fibrillation, on coumadin PRA  rheumatic heart disease  recently valve surgery 02/15/12 - mechanical mitral valve replacement, tricuspid valve repair, and complete biatrial lesion set MAZE using bipolar radiofrequency and cryothermy ablation with clipping of left atrial appendage  EF 20-25%  Hospital day # 3  TREATMENT/PLAN  Continue  warfarin at goal INR 2.5-3 (2.56 today) for mechanical heart valve  Outpatient ST (I ordered) No further stroke workup indicated. Patient has a 10-15% risk of having another stroke over the next year, the highest risk is within 2 weeks of the most recent stroke/TIA (risk of having a stroke following a stroke or TIA is the same). Ongoing risk factor control by Primary Care Physician Stroke Service will sign off. Please call should any needs arise. Follow up with Dr. Pearlean Brownie, Stroke  Clinic, in 2 months. D/w patient, daughter and Dr Berneda Rose, MSN, RN, ANVP-BC, ANP-BC, GNP-BC [facility] Pager: [phone] 04/17/2012 9:42 AM I have personally obtained a history, examined the patient, evaluated imaging results, and formulated the assessment and plan of care. I agree with the above.   Delia Heady, MD

## 2012-04-17 NOTE — Progress Notes (Signed)
ANTICOAGULATION CONSULT NOTE - Follow-up Consult  Pharmacy Consult for UFH / Coumadin Indication: h/o afib/MVR  No Known Allergies  Labs:  Recent Labs  04/14/12 1225 04/14/12 1240  04/15/12 0530  04/16/12 0450 04/16/12 1837 04/17/12 0500  HGB 11.3* 12.6  --  11.0*  --  11.0*  --  10.8*  HCT 34.5* 37.0  --  33.7*  --  33.8*  --  33.2*  PLT 195  --   --  210  --  207  --  210  APTT 43*  --   --   --   --   --   --   --   LABPROT 20.6*  --   --   --   --  22.3*  --  26.3*  INR 1.84*  --   --   --   --  2.05*  --  2.56*  HEPARINUNFRC  --   --   < > 0.24*  < > 0.61 0.57 0.51  CREATININE 0.76 0.80  --   --   --  0.79  --   --   TROPONINI <0.30  --   --   --   --   --   --   --   < > = values in this interval not displayed.  Estimated Creatinine Clearance: 91.7 ml/min (by C-G formula based on Cr of 0.79).   Assessment: 57 year old female admitted with acute CVA.  History of MV valve and valve repair with afib.  Heparin level therapeutic, INR = 2.56  Goal of Therapy:  Heparin level 0.3-0.5 units/ml Monitor platelets by anticoagulation protocol: Yes INR 2.5 - 3.5   Plan:  Decrease heparin IV rate to 1250 units/hr (discontinue?) Coumadin 4 mg po x 1 dose today Follow up AM  Thank you. Okey Regal, PharmD [phone]

## 2012-04-18 LAB — CBC
Hemoglobin: 10.7 g/dL — ABNORMAL LOW (ref 12.0–15.0)
MCV: 88.5 fL (ref 78.0–100.0)
Platelets: 211 10*3/uL (ref 150–400)
RBC: 3.74 MIL/uL — ABNORMAL LOW (ref 3.87–5.11)
WBC: 5.9 10*3/uL (ref 4.0–10.5)

## 2012-04-18 LAB — GLUCOSE, CAPILLARY: Glucose-Capillary: 152 mg/dL — ABNORMAL HIGH (ref 70–99)

## 2012-04-18 MED ORDER — WARFARIN SODIUM 6 MG PO TABS
6.0000 mg | ORAL_TABLET | Freq: Once | ORAL | Status: DC
  Filled 2012-04-18: qty 1

## 2012-04-18 MED ORDER — FUROSEMIDE 10 MG/ML IJ SOLN
40.0000 mg | Freq: Once | INTRAMUSCULAR | Status: AC
  Administered 2012-04-18: 40 mg via INTRAVENOUS
  Filled 2012-04-18: qty 4

## 2012-04-18 MED ORDER — WARFARIN SODIUM 4 MG PO TABS
8.0000 mg | ORAL_TABLET | Freq: Once | ORAL | Status: AC
  Administered 2012-04-18: 8 mg via ORAL
  Filled 2012-04-18: qty 2

## 2012-04-18 MED ORDER — METOPROLOL TARTRATE 100 MG PO TABS
100.0000 mg | ORAL_TABLET | Freq: Two times a day (BID) | ORAL | Status: DC
Start: 2012-04-18 — End: 2012-04-19
  Administered 2012-04-18 – 2012-04-19 (×3): 100 mg via ORAL
  Filled 2012-04-18 (×4): qty 1

## 2012-04-18 NOTE — Progress Notes (Signed)
Patient ID: [name]  female  RUE:[id number]    DOB: 1955/06/09    DOA: 04/14/2012  PCP: Hulda Humphrey, NP  Assessment/Plan: Principal Problem:   CVA (cerebral infarction) Active Problems:   Hypertension   Diabetes mellitus   Atrial fibrillation   CHF (congestive heart failure)   S/P mitral valve replacement with metallic valve  Acute CVA - Scattered left hemispheric/middle cerebral artery distribution infarcts involving left insula/periopercular region and left frontal lobe - MRI of the brain showed scattered left hemispheric/MCA distribution infarcts involving portions of the left insula/opercular region and left frontal lobe  - MRA brain showed intracranial atherosclerotic type changes, decreased number of visualized left MCA artery branch vessels - 2-D echo showed EF of 20-25% with no source of embolus, diffuse hypokinesis - Carotid Doppler in 2/14 were normal - on coumadin   Hypertension - stable  Diabetes mellitus  Poorly controlled w/ HbA1c of 8.7 - CBG controlled as inpatient at this time   Hx of Chronic Atrial fibrillation / atypical flutter  Care as per Cardiology - S/p MAZE at time of valve surgery   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   Newly appreciated systolic CHF  TTE reveals EF of 20-25% - this is new since Dec 2013 when TEE revealed EF of 55-60% - perhaps this is simply relate to the valve surgery? -  Cardiology is following, on BB and ARB,   Obesity  HLD  Stroke Team has adjusted medical tx in response to elevated LDL   DVT Prophylaxis:  Code Status:  Disposition:     Subjective: No specific complaints, denies any chest pain shortness of breath fevers or chills  Objective: Weight change:    Intake/Output Summary (Last 24 hours) at 04/18/12 1509 Last data filed at 04/18/12 1300  Gross per 24 hour  Intake    240 ml  Output      2 ml  Net    238 ml   Blood pressure 134/96, pulse 81, temperature 97.9 F (36.6 C), temperature source Oral, resp. rate 18, height 5\' 6"  (1.676 m), weight 98.4 kg (216 lb 14.9 oz), SpO2 100.00%.  Physical Exam: General: Alert and awake, oriented x3, not in any acute distress. CVS: S1-S2 clear, no murmur rubs or gallops Chest: clear to auscultation bilaterally, no wheezing, rales or rhonchi Abdomen: soft nontender, nondistended, normal bowel sounds, no organomegaly Extremities: no cyanosis, clubbing, trace edema noted bilaterally   Lab Results: Basic Metabolic Panel:  Recent Labs Lab 04/14/12 1225 04/14/12 1240 04/16/12 0450  NA 137 143 137  K 3.2* 3.4* 4.2  CL 102 104 106  CO2 25  --  23  GLUCOSE 147* 152* 130*  BUN 10 9 14   CREATININE 0.76 0.80 0.79  CALCIUM 9.3  --  9.4  MG  --   --  2.2   Liver Function Tests:  Recent Labs Lab 04/14/12 1225  AST 27  ALT 20  ALKPHOS 123*  BILITOT 0.7  PROT 8.2  ALBUMIN 3.6   No results found for this basename: LIPASE, AMYLASE,  in the last 168 hours No results found for this basename: AMMONIA,  in the last 168 hours CBC:  Recent Labs Lab 04/14/12 1225  04/17/12 0500 04/18/12  0635  WBC 5.9  < > 6.8 5.9  NEUTROABS 3.7  --   --   --   HGB 11.3*  < > 10.8* 10.7*  HCT 34.5*  < > 33.2* 33.1*  MCV 87.3  < > 88.1 88.5  PLT 195  < > 210 211  < > = values in this interval not displayed. Cardiac Enzymes:  Recent Labs Lab 04/14/12 1225  TROPONINI <0.30   BNP: No components found with this basename: POCBNP,  CBG:  Recent Labs Lab 04/17/12 1159 04/17/12 1638 04/17/12 2204 04/18/12 0645 04/18/12 1133  GLUCAP 173* 130* 85 109* 152*     Micro Results: Recent Results (from the past 240 hour(s))  MRSA PCR SCREENING     Status: None   Collection Time    04/14/12  5:40 PM       Result Value Range Status   MRSA by PCR NEGATIVE  NEGATIVE Final   Comment:            The GeneXpert MRSA Assay (FDA     approved for NASAL specimens     only), is one component of a     comprehensive MRSA colonization     surveillance program. It is not     intended to diagnose MRSA     infection nor to guide or     monitor treatment for     MRSA infections.    Studies/Results: Dg Chest 2 View  04/15/2012  *RADIOLOGY REPORT*  Clinical Data: History of stroke.  CHEST - 2 VIEW  Comparison: 03/19/2012  Findings: Patient has had median sternotomy and valve replacement. Heart is enlarged.  There is diffuse, interstitial pulmonary edema. No focal consolidations or definite pleural effusions are identified.  IMPRESSION: Cardiomegaly and interstitial edema.   Original Report Authenticated By: Norva Pavlov, M.D.    Dg Chest 2 View  03/19/2012  *RADIOLOGY REPORT*  Clinical Data: History of heart surgery.  CHEST - 2 VIEW  Comparison: 02/24/2012.  Findings: Trachea is midline.  Heart is enlarged, stable.  Right PICC has been removed.  Lungs are somewhat low in volume but clear. No pleural fluid.  IMPRESSION: Low lung volumes without acute finding.   Original Report Authenticated By: Leanna Battles, M.D.    Ct Head (brain) Wo Contrast  04/14/2012  *RADIOLOGY REPORT*  Clinical Data: Right-sided facial droop, right-sided weakness and lethargy.  CT HEAD WITHOUT CONTRAST  Technique:  Contiguous axial images were obtained from the base of the skull through the vertex without contrast.  Comparison: None.  Findings: The brain demonstrates no evidence of hemorrhage, infarction, edema, mass effect, extra-axial fluid collection, hydrocephalus or mass lesion.  The skull is unremarkable.  IMPRESSION: No acute findings.   Original Report Authenticated By: Irish Lack, M.D.    Mr [facility] Wo Contrast  04/15/2012  *RADIOLOGY REPORT*  Clinical Data:  Atrial fibrillation.  Valve replacement.  Diabetic  hypertensive patient.  Developed slurred speech and right arm weakness.  MRI BRAIN WITHOUT CONTRAST MRA HEAD WITHOUT CONTRAST  Technique: Multiplanar, multiecho pulse sequences of the brain and surrounding structures were obtained according to standard protocol without intravenous contrast.  Angiographic images of the head were obtained using MRA technique without contrast.  Comparison: 04/14/2012 head CT.  No comparison brain MR.  MRI HEAD  Findings:  Motion degraded exam.  Scattered left hemispheric/middle cerebral artery distribution infarcts involving portions of the left insula/periopercular region and left frontal lobe.  Punctate blood breakdown products posterior left frontal lobe  is not associated with acute infarct and may be related to the prior tiny area of hemorrhagic ischemia.  No other areas of intracranial hemorrhage.  Small vessel disease type changes most notable left periventricular region.  Mild atrophy without hydrocephalus.  No intracranial mass lesion detected on this unenhanced exam.  Retention cyst left maxillary sinus.  Minimal opacification anterior left ethmoid sinus.  Minimal exophthalmos.  IMPRESSION: Scattered left hemispheric/middle cerebral artery distribution infarcts involving portions of the left insula/periopercular region and left frontal lobe.  Please see above.  MRA HEAD  Findings: Ectatic cavernous and supraclinoid segment of the internal carotid artery bilaterally with areas of mild narrowing (most notable left supraclinoid internal carotid artery just proximal to the carotid terminus).  Mild to moderate narrowing of the A1 segment of the anterior cerebral artery bilaterally.  Middle cerebral artery branch vessel irregularity bilaterally. Decreased number of visualized left middle cerebral artery branches consistent with the patient's acute infarct.  M1 segment of the left middle cerebral artery does not appear significantly narrowed.  Right vertebral artery is dominant.   Narrowing of the distal left vertebral artery.  Mild irregularity of the PICA bilaterally.  Mild narrowing mid aspect of the basilar artery.  Nonvisualization AICAs.  High-grade stenosis proximal right posterior cerebral artery with abrupt occlusion of the right posterior cerebral artery proximal to the distal branches.  Moderate to marked narrowing at the bifurcation of the left posterior cerebral artery.  Mild to moderate narrowing superior cerebellar arteries bilaterally.  No aneurysm or vascular malformation is noted.  IMPRESSION: Intracranial atherosclerotic type changes as detailed above.  Most significant abnormality is that involving the posterior cerebral arteries (more notable on the right).  Decreased number of visualized left middle cerebral artery branch vessels consistent with the patient's acute left middle cerebral artery distribution infarct.  No significant stenosis of the M1 segment of the left middle cerebral artery.  Narrowing of the supraclinoid aspect of the left internal carotid artery.  Please see above.  Critical Value/emergent results were called by telephone at the time of interpretation on 04/15/2012 at 11:42 a.m. to [facility] the patient's nurse, who verbally acknowledged these results.   Original Report Authenticated By: Lacy Duverney, M.D.    Mr Brain Wo Contrast  04/15/2012  *RADIOLOGY REPORT*  Clinical Data:  Atrial fibrillation.  Valve replacement.  Diabetic hypertensive patient.  Developed slurred speech and right arm weakness.  MRI BRAIN WITHOUT CONTRAST MRA HEAD WITHOUT CONTRAST  Technique: Multiplanar, multiecho pulse sequences of the brain and surrounding structures were obtained according to standard protocol without intravenous contrast.  Angiographic images of the head were obtained using MRA technique without contrast.  Comparison: 04/14/2012 head CT.  No comparison brain MR.  MRI HEAD  Findings:  Motion degraded exam.  Scattered left hemispheric/middle cerebral artery  distribution infarcts involving portions of the left insula/periopercular region and left frontal lobe.  Punctate blood breakdown products posterior left frontal lobe is not associated with acute infarct and may be related to the prior tiny area of hemorrhagic ischemia.  No other areas of intracranial hemorrhage.  Small vessel disease type changes most notable left periventricular region.  Mild atrophy without hydrocephalus.  No intracranial mass lesion detected on this unenhanced exam.  Retention cyst left maxillary sinus.  Minimal opacification anterior left ethmoid sinus.  Minimal exophthalmos.  IMPRESSION: Scattered left hemispheric/middle cerebral artery distribution infarcts involving portions of the left insula/periopercular region and left frontal lobe.  Please see above.  MRA HEAD  Findings: Ectatic cavernous  and supraclinoid segment of the internal carotid artery bilaterally with areas of mild narrowing (most notable left supraclinoid internal carotid artery just proximal to the carotid terminus).  Mild to moderate narrowing of the A1 segment of the anterior cerebral artery bilaterally.  Middle cerebral artery branch vessel irregularity bilaterally. Decreased number of visualized left middle cerebral artery branches consistent with the patient's acute infarct.  M1 segment of the left middle cerebral artery does not appear significantly narrowed.  Right vertebral artery is dominant.  Narrowing of the distal left vertebral artery.  Mild irregularity of the PICA bilaterally.  Mild narrowing mid aspect of the basilar artery.  Nonvisualization AICAs.  High-grade stenosis proximal right posterior cerebral artery with abrupt occlusion of the right posterior cerebral artery proximal to the distal branches.  Moderate to marked narrowing at the bifurcation of the left posterior cerebral artery.  Mild to moderate narrowing superior cerebellar arteries bilaterally.  No aneurysm or vascular malformation is noted.   IMPRESSION: Intracranial atherosclerotic type changes as detailed above.  Most significant abnormality is that involving the posterior cerebral arteries (more notable on the right).  Decreased number of visualized left middle cerebral artery branch vessels consistent with the patient's acute left middle cerebral artery distribution infarct.  No significant stenosis of the M1 segment of the left middle cerebral artery.  Narrowing of the supraclinoid aspect of the left internal carotid artery.  Please see above.  Critical Value/emergent results were called by telephone at the time of interpretation on 04/15/2012 at 11:42 a.m. to [facility] the patient's nurse, who verbally acknowledged these results.   Original Report Authenticated By: Lacy Duverney, M.D.    Dg Swallowing Func-speech Pathology  04/15/2012  Lacinda Axon, CCC-SLP     04/15/2012  2:51 PM Objective Swallowing Evaluation: Bedside swallow evaluation  Patient Details  Name: [name] MRN: [id number] Date of Birth: 08/28/1955  Today's Date: 04/15/2012 Time: 1120-1140 SLP Time Calculation (min): 20 min  Past Medical History:  Past Medical History  Diagnosis Date  . Asthma   . Hypertension   . Obesity   . Chronic diastolic congestive heart failure   . Carotid bruit   . Hypercholesterolemia   . Polyp of rectum   . Abnormal chest CT   . Persistent atrial fibrillation   . Bronchospasm 1998  . Atrial enlargement, left     severe  . Heart murmur   . Sleep apnea     DOES NOT HAVE CPAP  . Diabetes mellitus     insulin dependent  . Mitral regurgitation   . Tricuspid regurgitation 01/16/2012  . Rheumatic fever 01/16/2012    Reported during childhood  . Atrial fibrillation 01/05/2011    Chronic persistent, failed DCCV   . Obesity (BMI 30-39.9) 01/16/2012  . Shortness of breath     with exertion  . History of blood transfusion   . 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 S/P Maze operation for atrial fibrillation 02/15/2012    Complete biatrial lesion set using bipolar radiofrequency and  cryothermy ablation with clipping of LA appendage   Past Surgical History:  Past Surgical History  Procedure Laterality Date  . Cardiovascular stress test  09/2010  . Cardiac catheterization  >5 years  . Cardioversion  03/25/2011    Procedure: CARDIOVERSION;  Surgeon: Corky Crafts, MD;   Location:  [facility] OR;  Service: Cardiovascular;  Laterality: N/A;  . Tee without cardioversion  12/21/2011    Procedure: TRANSESOPHAGEAL ECHOCARDIOGRAM (TEE);  Surgeon:  Corky Crafts, MD;  Location: [facility] ENDOSCOPY;  Service:  Cardiovascular;  Laterality: N/A;  . No past surgeries    . Tricuspid valve replacement  02/15/2012    Procedure: TRICUSPID VALVE REPAIR;  Surgeon: Purcell Nails,  MD;  Location: [facility] OR;  Service: Open Heart Surgery;  Laterality:  N/A;  . Maze  02/15/2012    Procedure: MAZE;  Surgeon: Purcell Nails, MD;  Location: [facility]  OR;  Service: Open Heart Surgery;  Laterality: N/A;  . Intraoperative transesophageal echocardiogram  02/15/2012    Procedure: INTRAOPERATIVE TRANSESOPHAGEAL ECHOCARDIOGRAM;   Surgeon: Purcell Nails, MD;  Location: [facility] OR;  Service: Open  Heart Surgery;  Laterality: N/A;  . Mitral valve replacement  02/15/2012    Procedure: MITRAL VALVE (MV) REPLACEMENT;  Surgeon: Purcell Nails, MD;  Location: [facility] OR;  Service: Open Heart Surgery;   Laterality: N/A;   HPI:    [name] is a 57 y.o. 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  Probably last time she was seen normal was at 8: 50. She was  found by her son sitting in the couch, weak, unable to speak.  Patient was initially a code stroke, patient was not candidate  for TPA due to elevated INR at 1.8.  During my evaluation patient  has expressive and dysarthric aphasia. She thought that her  daughter was her sister. MRI: Scattered left hemispheric/middle  cerebral artery distribution, infarcts involving portions of the  left insula/peripercular region and left frontal lobe.  Objective  assessment of MBS indicated due to results of BSE to assess risk  for aspiration.      Assessment / Plan / Recommendation Clinical Impression  Dysphagia Diagnosis: Mild oral phase dysphagia;Mild pharyngeal  phase dysphagia;Moderate cervical esophageal phase dysphagia Mild sensory motor oral dysphagia marked by decreased lingual  strength and coordination resulting in delay in oral transit,  oral holding, and piecemeal swallows.   Mild pharyngeal dysphagia  with deep penetration to cords initial swallow of thin liquid  barium by cup due to incomplete airway closure.  Patient sensed  penetration with subsequent effective throat clear.   Pharyngeal  swallow improved as evaluation proceeded as only one instance of  penetration occurring out of multiple trials of thin.  Minimal  pharyngeal residue at CP segment s/p swallow of puree, thin and  solid trials.  Brief esophageal screen indicates stasis in  thoracic esophagus with backflow of puree and thin liquids to  cervical esophagus.  Incomplete transit with whole barium tablet  with stasis in thoracic esophagus.  Cued dry swallows and sips of  thin liquid ineffective in completing transit.  Recommend to  proceed with Dysphagia 3 diet consistency with thin liquids with  full supervision due to cognitive status.  Recommend modified  diet mainly due to oral phase dysphagia.  ST to follow in acute  care setting for diet tolerance and possible advancement.  No  repeat MBS warranted at this time for diet advancement.  Patient  may benefit from GI consult to assess esophageal functioning.     Treatment Recommendation  Therapy as outlined in treatment plan below    Diet Recommendation  Dysphagia  3 (Mechanical Soft);Thin liquid   Liquid Administration via: Cup;Straw Medication Administration: Crushed with puree Supervision: Patient able to self feed;Full supervision/cueing  for compensatory strategies Compensations: Slow rate;Small sips/bites;Check for  pocketing;Multiple dry swallows after each bite/sip Postural Changes and/or Swallow Maneuvers: Out of bed for  meals;Seated upright 90 degrees;Upright 30-60 min after meal    Other  Recommendations Recommended Consults: Consider GI  evaluation Oral Care Recommendations: Oral care QID   Follow Up Recommendations  Outpatient SLP    Frequency and Duration min 2x/week  2 weeks       SLP Swallow Goals Patient will utilize recommended strategies during swallow to  increase swallowing safety with: Modified independent assistance   General Date of Onset: 04/14/12 Type of Study: Bedside swallow evaluation Reason for Referral: Objectively evaluate swallowing function Diet Prior to this Study: NPO Temperature Spikes Noted: No Respiratory Status: Room air History of Recent Intubation: No Behavior/Cognition: Alert;Cooperative;Pleasant mood;Requires  cueing Oral Cavity - Dentition: Adequate natural dentition Oral Motor / Sensory Function: Impaired - see Bedside swallow  eval Self-Feeding Abilities: Needs assist;Able to feed self Patient Positioning: Upright in chair Baseline Vocal Quality: Hoarse;Low vocal intensity Volitional Cough: Strong Volitional Swallow: Able to elicit Anatomy: Within functional limits Pharyngeal Secretions: Not observed secondary MBS    Reason for Referral Objectively evaluate swallowing function   Oral Phase Oral Preparation/Oral Phase Oral Phase: Impaired Oral - Nectar Oral - Nectar Cup: Weak lingual manipulation;Holding of  bolus;Piecemeal swallowing;Lingual/palatal residue;Delayed oral  transit Oral - Thin Oral - Thin Cup: Weak lingual manipulation;Delayed oral  transit;Lingual/palatal residue;Piecemeal swallowing;Holding of  bolus;Reduced  posterior propulsion Oral - Solids Oral - Puree: Weak lingual manipulation;Reduced posterior  propulsion;Holding of bolus;Piecemeal swallowing;Lingual/palatal  residue;Delayed oral transit Oral - Regular: Impaired mastication;Weak lingual  manipulation;Incomplete tongue to palate contact;Holding of  bolus;Piecemeal swallowing;Lingual/palatal residue;Delayed oral  transit Oral - Pill: Weak lingual manipulation;Holding of bolus;Delayed  oral transit   Pharyngeal Phase Pharyngeal Phase Pharyngeal Phase: Impaired Pharyngeal - Nectar Pharyngeal - Nectar Cup: Premature spillage to valleculae;Reduced  pharyngeal peristalsis;Reduced airway/laryngeal closure;Reduced  tongue base retraction;Pharyngeal residue - cp segment Pharyngeal - Thin Pharyngeal - Thin Cup: Premature spillage to valleculae;Reduced  airway/laryngeal closure;Penetration/Aspiration before  swallow;Penetration/Aspiration during swallow;Trace  aspiration;Pharyngeal residue - cp segment Penetration/Aspiration details (thin cup): Material enters  airway, CONTACTS cords then ejected out Pharyngeal - Thin Straw: Premature spillage to valleculae;Reduced  pharyngeal peristalsis;Reduced tongue base retraction;Reduced  airway/laryngeal closure Pharyngeal - Solids Pharyngeal - Puree: Premature spillage to valleculae;Reduced  tongue base retraction;Pharyngeal residue - cp segment;Pharyngeal  residue - pyriform sinuses Pharyngeal - Regular: Premature spillage to valleculae;Reduced  tongue base retraction;Pharyngeal residue - cp segment Pharyngeal - Pill: Delayed swallow initiation  Cervical Esophageal Phase    GO    Cervical Esophageal Phase Cervical Esophageal Phase: Impaired Cervical Esophageal Phase - Thin Thin Cup: Esophageal backflow into cervical esophagus;Prominent  cricopharyngeal segment;Reduced cricopharyngeal relaxation Cervical Esophageal Phase - Solids Regular: Reduced cricopharyngeal relaxation;Prominent  cricopharyngeal segment;Esophageal backflow into  cervical  esophagus Pill: Esophageal backflow into cervical esophagus;Reduced  cricopharyngeal relaxation;Prominent cricopharyngeal segment        Moreen Fowler MS, CCC-SLP [phone] [facility] 04/15/2012, 2:32 PM      Medications: Scheduled Meds: . atorvastatin  80 mg Oral QPM  . insulin aspart  0-9 Units Subcutaneous TID WC  . insulin glargine  10 Units Subcutaneous QHS  . irbesartan  300 mg Oral Daily  . metoprolol tartrate  100 mg Oral BID  . potassium chloride  20 mEq Oral Daily  .  warfarin  6 mg Oral ONCE-1800  . Warfarin - Pharmacist Dosing Inpatient   Does not apply q1800      LOS: 4 days   Shravya Wickwire M.D. [facility] 04/18/2012, 3:09 PM Pager: [phone]  If 7PM-7AM, please contact night-coverage [email or web address] Password [email or web address]

## 2012-04-18 NOTE — Progress Notes (Signed)
ANTICOAGULATION CONSULT NOTE - Follow-up Consult  Pharmacy Consult for UFH / Coumadin Indication: h/o afib/MVR  No Known Allergies  Labs:  Recent Labs  04/16/12 0450 04/16/12 1837 04/17/12 0500 04/18/12 0635  HGB 11.0*  --  10.8* 10.7*  HCT 33.8*  --  33.2* 33.1*  PLT 207  --  210 211  LABPROT 22.3*  --  26.3* 25.3*  INR 2.05*  --  2.56* 2.43*  HEPARINUNFRC 0.61 0.57 0.51 0.26*  CREATININE 0.79  --   --   --     Estimated Creatinine Clearance: 91.7 ml/min (by C-G formula based on Cr of 0.79).   Assessment: 57 year old female admitted with acute CVA.  History of MV valve and valve repair with afib.  Heparin level = 0.26, INR = 2.43  Goal of Therapy:  Heparin level 0.3-0.5 units/ml Monitor platelets by anticoagulation protocol: Yes INR 2.5 - 3.5   Plan:  Increase heparin IV rate to 1275 units/hr  Coumadin 6 mg po x 1 dose today Follow up AM if not discharged today  Thank you. Okey Regal, PharmD [phone]

## 2012-04-18 NOTE — Progress Notes (Signed)
Speech Language Pathology Treatment Patient Details Name: [name] MRN: [id number] DOB: Feb 01, 1955 Today's Date: 04/18/2012 Time: 1242-1309 SLP Time Calculation (min): 27 min  Assessment / Plan / Recommendation Clinical Impression  F/u for aphasia and dysphagia tx.  Pt demonstrates improved expressive communication, with increased use of grammatic form and improved divergence during naming tasks.  She described pictures using complete sentences independently.  Comprehension also improved with increased accuracy following multistep commands.  Right oral-motor deficits persist - pt is tolerating a dysphagia 3 diet with thin liquids with no signs of airway compromise.    Recommend advancing diet to regular/thin; OPSLP f/u  - pt has made improvements but her communication is not back to baseline per her description.    SLP Plan  Continue with current plan of care    Pertinent Vitals/Pain No pain  SLP Goals  SLP Goals Potential to Achieve Goals: Good SLP Goal #1: Pt will describe pictured items with mod cues to use complete sentences with 80% accuracy. SLP Goal #1 - Progress: Met SLP Goal #2: Pt will follow two-step commands with 80% accuracy and min assist. SLP Goal #2 - Progress: Progressing toward goal  General Temperature Spikes Noted: No Respiratory Status: Room air Behavior/Cognition: Alert;Cooperative;Pleasant mood Oral Cavity - Dentition: Adequate natural dentition Patient Positioning: Upright in chair  Oral Cavity - Oral Hygiene Does patient have any of the following "at risk" factors?: Other - dysphagia Patient is AT RISK - Oral Care Protocol followed (see row info): Yes   Treatment Treatment focused on: Aphasia;Other (comment) (dysphagia) Skilled Treatment: language facilitation; clinical observation of oropharyngeal swallow   Jaqulyn Chancellor L. Samson Frederic, [place] CCC/SLP Pager [phone]      Blenda Mounts Laurice 04/18/2012, 2:16 PM

## 2012-04-18 NOTE — Progress Notes (Signed)
SUBJECTIVE:  Feels better.  PT signed off.  She felt that she urinated a lot yesterday.  OBJECTIVE:   Vitals:   Filed Vitals:   04/17/12 1815 04/17/12 2104 04/18/12 0143 04/18/12 0620  BP: 135/90 133/93 133/89 139/93  Pulse: 86 83 78 82  Temp: 98.1 F (36.7 C) 98.3 F (36.8 C) 98.1 F (36.7 C) 97.7 F (36.5 C)  TempSrc: Oral Oral Oral Oral  Resp: 18 16 16 16   Height:      Weight:      SpO2: 100% 99% 97% 100%   I&O's:   No intake or output data in the 24 hours ending 04/18/12 0957 TELEMETRY: Reviewed telemetry pt in rate controlled atrial flutter     PHYSICAL EXAM General: Well developed, well nourished, in no acute distress, speech improved Head:   Normal cephalic and atramatic  Lungs:   Clear bilaterally to auscultation and percussion. Heart:  Rate controlled, crisp S1 click, normal S2 Abdomen:  abdomen soft and non-tender  Extremities:  Trace pretibial edema bilaterally Neuro: Alert and oriented X 3. Psych:  Normal affect, responds appropriately   LABS: Basic Metabolic Panel:  Recent Labs  16/10/96 0450  NA 137  K 4.2  CL 106  CO2 23  GLUCOSE 130*  BUN 14  CREATININE 0.79  CALCIUM 9.4  MG 2.2   Liver Function Tests: No results found for this basename: AST, ALT, ALKPHOS, BILITOT, PROT, ALBUMIN,  in the last 72 hours No results found for this basename: LIPASE, AMYLASE,  in the last 72 hours CBC:  Recent Labs  04/17/12 0500 04/18/12 0635  WBC 6.8 5.9  HGB 10.8* 10.7*  HCT 33.2* 33.1*  MCV 88.1 88.5  PLT 210 211   Cardiac Enzymes: No results found for this basename: CKTOTAL, CKMB, CKMBINDEX, TROPONINI,  in the last 72 hours BNP: No components found with this basename: POCBNP,  D-Dimer: No results found for this basename: DDIMER,  in the last 72 hours Hemoglobin A1C: No results found for this basename: HGBA1C,  in the last 72 hours Fasting Lipid Panel: No results found for this basename: CHOL, HDL, LDLCALC, TRIG, CHOLHDL, LDLDIRECT,  in the  last 72 hours Thyroid Function Tests: No results found for this basename: TSH, T4TOTAL, FREET3, T3FREE, THYROIDAB,  in the last 72 hours Anemia Panel: No results found for this basename: VITAMINB12, FOLATE, FERRITIN, TIBC, IRON, RETICCTPCT,  in the last 72 hours Coag Panel:   Lab Results  Component Value Date   INR 2.43* 04/18/2012   INR 2.56* 04/17/2012   INR 2.05* 04/16/2012    RADIOLOGY: Dg Chest 2 View  04/15/2012  *RADIOLOGY REPORT*  Clinical Data: History of stroke.  CHEST - 2 VIEW  Comparison: 03/19/2012  Findings: Patient has had median sternotomy and valve replacement. Heart is enlarged.  There is diffuse, interstitial pulmonary edema. No focal consolidations or definite pleural effusions are identified.  IMPRESSION: Cardiomegaly and interstitial edema.   Original Report Authenticated By: Norva Pavlov, M.D.    Dg Chest 2 View  03/19/2012  *RADIOLOGY REPORT*  Clinical Data: History of heart surgery.  CHEST - 2 VIEW  Comparison: 02/24/2012.  Findings: Trachea is midline.  Heart is enlarged, stable.  Right PICC has been removed.  Lungs are somewhat low in volume but clear. No pleural fluid.  IMPRESSION: Low lung volumes without acute finding.   Original Report Authenticated By: Leanna Battles, M.D.    Ct Head (brain) Wo Contrast  04/14/2012  *RADIOLOGY REPORT*  Clinical Data: Right-sided  facial droop, right-sided weakness and lethargy.  CT HEAD WITHOUT CONTRAST  Technique:  Contiguous axial images were obtained from the base of the skull through the vertex without contrast.  Comparison: None.  Findings: The brain demonstrates no evidence of hemorrhage, infarction, edema, mass effect, extra-axial fluid collection, hydrocephalus or mass lesion.  The skull is unremarkable.  IMPRESSION: No acute findings.   Original Report Authenticated By: Irish Lack, M.D.    Mr [facility] Wo Contrast  04/15/2012  *RADIOLOGY REPORT*  Clinical Data:  Atrial fibrillation.  Valve replacement.  Diabetic  hypertensive patient.  Developed slurred speech and right arm weakness.  MRI BRAIN WITHOUT CONTRAST MRA HEAD WITHOUT CONTRAST  Technique: Multiplanar, multiecho pulse sequences of the brain and surrounding structures were obtained according to standard protocol without intravenous contrast.  Angiographic images of the head were obtained using MRA technique without contrast.  Comparison: 04/14/2012 head CT.  No comparison brain MR.  MRI HEAD  Findings:  Motion degraded exam.  Scattered left hemispheric/middle cerebral artery distribution infarcts involving portions of the left insula/periopercular region and left frontal lobe.  Punctate blood breakdown products posterior left frontal lobe is not associated with acute infarct and may be related to the prior tiny area of hemorrhagic ischemia.  No other areas of intracranial hemorrhage.  Small vessel disease type changes most notable left periventricular region.  Mild atrophy without hydrocephalus.  No intracranial mass lesion detected on this unenhanced exam.  Retention cyst left maxillary sinus.  Minimal opacification anterior left ethmoid sinus.  Minimal exophthalmos.  IMPRESSION: Scattered left hemispheric/middle cerebral artery distribution infarcts involving portions of the left insula/periopercular region and left frontal lobe.  Please see above.  MRA HEAD  Findings: Ectatic cavernous and supraclinoid segment of the internal carotid artery bilaterally with areas of mild narrowing (most notable left supraclinoid internal carotid artery just proximal to the carotid terminus).  Mild to moderate narrowing of the A1 segment of the anterior cerebral artery bilaterally.  Middle cerebral artery branch vessel irregularity bilaterally. Decreased number of visualized left middle cerebral artery branches consistent with the patient's acute infarct.  M1 segment of the left middle cerebral artery does not appear significantly narrowed.  Right vertebral artery is dominant.   Narrowing of the distal left vertebral artery.  Mild irregularity of the PICA bilaterally.  Mild narrowing mid aspect of the basilar artery.  Nonvisualization AICAs.  High-grade stenosis proximal right posterior cerebral artery with abrupt occlusion of the right posterior cerebral artery proximal to the distal branches.  Moderate to marked narrowing at the bifurcation of the left posterior cerebral artery.  Mild to moderate narrowing superior cerebellar arteries bilaterally.  No aneurysm or vascular malformation is noted.  IMPRESSION: Intracranial atherosclerotic type changes as detailed above.  Most significant abnormality is that involving the posterior cerebral arteries (more notable on the right).  Decreased number of visualized left middle cerebral artery branch vessels consistent with the patient's acute left middle cerebral artery distribution infarct.  No significant stenosis of the M1 segment of the left middle cerebral artery.  Narrowing of the supraclinoid aspect of the left internal carotid artery.  Please see above.  Critical Value/emergent results were called by telephone at the time of interpretation on 04/15/2012 at 11:42 a.m. to [facility] the patient's nurse, who verbally acknowledged these results.   Original Report Authenticated By: Lacy Duverney, M.D.    Mr Brain Wo Contrast  04/15/2012  *RADIOLOGY REPORT*  Clinical Data:  Atrial fibrillation.  Valve replacement.  Diabetic hypertensive patient.  Developed slurred speech and right arm weakness.  MRI BRAIN WITHOUT CONTRAST MRA HEAD WITHOUT CONTRAST  Technique: Multiplanar, multiecho pulse sequences of the brain and surrounding structures were obtained according to standard protocol without intravenous contrast.  Angiographic images of the head were obtained using MRA technique without contrast.  Comparison: 04/14/2012 head CT.  No comparison brain MR.  MRI HEAD  Findings:  Motion degraded exam.  Scattered left hemispheric/middle cerebral artery  distribution infarcts involving portions of the left insula/periopercular region and left frontal lobe.  Punctate blood breakdown products posterior left frontal lobe is not associated with acute infarct and may be related to the prior tiny area of hemorrhagic ischemia.  No other areas of intracranial hemorrhage.  Small vessel disease type changes most notable left periventricular region.  Mild atrophy without hydrocephalus.  No intracranial mass lesion detected on this unenhanced exam.  Retention cyst left maxillary sinus.  Minimal opacification anterior left ethmoid sinus.  Minimal exophthalmos.  IMPRESSION: Scattered left hemispheric/middle cerebral artery distribution infarcts involving portions of the left insula/periopercular region and left frontal lobe.  Please see above.  MRA HEAD  Findings: Ectatic cavernous and supraclinoid segment of the internal carotid artery bilaterally with areas of mild narrowing (most notable left supraclinoid internal carotid artery just proximal to the carotid terminus).  Mild to moderate narrowing of the A1 segment of the anterior cerebral artery bilaterally.  Middle cerebral artery branch vessel irregularity bilaterally. Decreased number of visualized left middle cerebral artery branches consistent with the patient's acute infarct.  M1 segment of the left middle cerebral artery does not appear significantly narrowed.  Right vertebral artery is dominant.  Narrowing of the distal left vertebral artery.  Mild irregularity of the PICA bilaterally.  Mild narrowing mid aspect of the basilar artery.  Nonvisualization AICAs.  High-grade stenosis proximal right posterior cerebral artery with abrupt occlusion of the right posterior cerebral artery proximal to the distal branches.  Moderate to marked narrowing at the bifurcation of the left posterior cerebral artery.  Mild to moderate narrowing superior cerebellar arteries bilaterally.  No aneurysm or vascular malformation is noted.   IMPRESSION: Intracranial atherosclerotic type changes as detailed above.  Most significant abnormality is that involving the posterior cerebral arteries (more notable on the right).  Decreased number of visualized left middle cerebral artery branch vessels consistent with the patient's acute left middle cerebral artery distribution infarct.  No significant stenosis of the M1 segment of the left middle cerebral artery.  Narrowing of the supraclinoid aspect of the left internal carotid artery.  Please see above.  Critical Value/emergent results were called by telephone at the time of interpretation on 04/15/2012 at 11:42 a.m. to [facility] the patient's nurse, who verbally acknowledged these results.   Original Report Authenticated By: Lacy Duverney, M.D.    Dg Swallowing Func-speech Pathology  04/15/2012  Lacinda Axon, CCC-SLP     04/15/2012  2:51 PM Objective Swallowing Evaluation: Bedside swallow evaluation  Patient Details  Name: [name] MRN: [id number] Date of Birth: Nov 01, 1955  Today's Date: 04/15/2012 Time: 1120-1140 SLP Time Calculation (min): 20 min  Past Medical History:  Past Medical History  Diagnosis Date  . Asthma   . Hypertension   . Obesity   . Chronic diastolic congestive heart failure   . Carotid bruit   . Hypercholesterolemia   . Polyp of rectum   . Abnormal chest CT   . Persistent atrial fibrillation   . Bronchospasm 1998  . Atrial enlargement, left  severe  . Heart murmur   . Sleep apnea     DOES NOT HAVE CPAP  . Diabetes mellitus     insulin dependent  . Mitral regurgitation   . Tricuspid regurgitation 01/16/2012  . Rheumatic fever 01/16/2012    Reported during childhood  . Atrial fibrillation 01/05/2011    Chronic persistent, failed DCCV   . Obesity (BMI 30-39.9) 01/16/2012  . Shortness of breath     with exertion  . History of blood transfusion   . 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 S/P Maze operation for atrial fibrillation 02/15/2012    Complete biatrial lesion set using bipolar radiofrequency and  cryothermy ablation with clipping of LA appendage   Past Surgical History:  Past Surgical History  Procedure Laterality Date  . Cardiovascular stress test  09/2010  . Cardiac catheterization  >5 years  . Cardioversion  03/25/2011    Procedure: CARDIOVERSION;  Surgeon: Corky Crafts, MD;   Location: [facility] OR;  Service: Cardiovascular;  Laterality: N/A;  . Tee without cardioversion  12/21/2011    Procedure: TRANSESOPHAGEAL ECHOCARDIOGRAM (TEE);  Surgeon:  Corky Crafts, MD;  Location: [facility] ENDOSCOPY;  Service:  Cardiovascular;  Laterality: N/A;  . No past surgeries    . Tricuspid valve replacement  02/15/2012    Procedure: TRICUSPID VALVE REPAIR;  Surgeon: Purcell Nails,  MD;  Location: [facility] OR;  Service: Open Heart Surgery;  Laterality:  N/A;  . Maze  02/15/2012    Procedure: MAZE;  Surgeon: Purcell Nails, MD;  Location[facility]  OR;  Service: Open Heart Surgery;  Laterality: N/A;  . Intraoperative transesophageal echocardiogram  02/15/2012    Procedure: INTRAOPERATIVE TRANSESOPHAGEAL ECHOCARDIOGRAM;   Surgeon: Purcell Nails, MD;  Location: [facility] OR;  Service: Open  Heart Surgery;  Laterality: N/A;  . Mitral valve replacement  02/15/2012    Procedure: MITRAL VALVE (MV) REPLACEMENT;  Surgeon: Purcell Nails, MD;  Location: [facility] OR;  Service: Open Heart Surgery;   Laterality: N/A;   HPI:    [name] is a 57 y.o. 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  Probably last time she was seen normal was at 8: 50. She was  found by her son sitting in the couch, weak, unable to speak.  Patient was initially a code stroke, patient was not candidate  for TPA due to elevated INR at 1.8.  During my evaluation patient  has expressive and dysarthric aphasia. She thought that her  daughter was her sister. MRI: Scattered left hemispheric/middle  cerebral artery distribution, infarcts involving portions of the  left insula/peripercular region and left frontal lobe.  Objective  assessment of MBS indicated due to results of BSE to assess risk  for aspiration.      Assessment / Plan / Recommendation Clinical Impression  Dysphagia Diagnosis: Mild oral phase dysphagia;Mild pharyngeal  phase dysphagia;Moderate cervical esophageal phase dysphagia Mild sensory motor oral dysphagia marked by decreased lingual  strength and coordination resulting in delay in oral transit,  oral holding, and piecemeal swallows.   Mild pharyngeal dysphagia  with deep penetration to cords initial swallow of thin liquid  barium  by cup due to incomplete airway closure.  Patient sensed  penetration with subsequent effective throat clear.   Pharyngeal  swallow improved as evaluation proceeded as only one instance of  penetration occurring out of multiple trials of thin.  Minimal  pharyngeal residue at CP segment s/p swallow of puree, thin and  solid trials.  Brief esophageal screen indicates stasis in  thoracic esophagus with backflow of puree and thin liquids to  cervical esophagus.  Incomplete transit with whole barium tablet  with stasis in thoracic esophagus.  Cued dry swallows and sips of  thin liquid ineffective in completing transit.  Recommend to  proceed with Dysphagia 3 diet consistency with thin liquids with  full supervision due to cognitive status.  Recommend modified  diet mainly due to oral phase dysphagia.  ST to follow in acute  care setting for diet tolerance and possible advancement.  No  repeat MBS warranted at this time for diet advancement.  Patient  may benefit from GI consult to assess esophageal functioning.     Treatment Recommendation  Therapy as outlined in treatment plan below    Diet Recommendation Dysphagia  3 (Mechanical Soft);Thin liquid   Liquid Administration via: Cup;Straw Medication Administration: Crushed with puree Supervision: Patient able to self feed;Full supervision/cueing  for compensatory strategies Compensations: Slow rate;Small sips/bites;Check for  pocketing;Multiple dry swallows after each bite/sip Postural Changes and/or Swallow Maneuvers: Out of bed for  meals;Seated upright 90 degrees;Upright 30-60 min after meal    Other  Recommendations Recommended Consults: Consider GI  evaluation Oral Care Recommendations: Oral care QID   Follow Up Recommendations  Outpatient SLP    Frequency and Duration min 2x/week  2 weeks       SLP Swallow Goals Patient will utilize recommended strategies during swallow to  increase swallowing safety with: Modified independent assistance   General Date of Onset: 04/14/12 Type of Study: Bedside swallow evaluation Reason for Referral: Objectively evaluate swallowing function Diet Prior to this Study: NPO Temperature Spikes Noted: No Respiratory Status: Room air History of Recent Intubation: No Behavior/Cognition: Alert;Cooperative;Pleasant mood;Requires  cueing Oral Cavity - Dentition: Adequate natural dentition Oral Motor / Sensory Function: Impaired - see Bedside swallow  eval Self-Feeding Abilities: Needs assist;Able to feed self Patient Positioning: Upright in chair Baseline Vocal Quality: Hoarse;Low vocal intensity Volitional Cough: Strong Volitional Swallow: Able to elicit Anatomy: Within functional limits Pharyngeal Secretions: Not observed secondary MBS    Reason for Referral Objectively evaluate swallowing function   Oral Phase Oral Preparation/Oral Phase Oral Phase: Impaired Oral - Nectar Oral - Nectar Cup: Weak lingual manipulation;Holding of  bolus;Piecemeal swallowing;Lingual/palatal residue;Delayed oral  transit Oral - Thin Oral - Thin Cup: Weak lingual manipulation;Delayed oral  transit;Lingual/palatal residue;Piecemeal swallowing;Holding of  bolus;Reduced  posterior propulsion Oral - Solids Oral - Puree: Weak lingual manipulation;Reduced posterior  propulsion;Holding of bolus;Piecemeal swallowing;Lingual/palatal  residue;Delayed oral transit Oral - Regular: Impaired mastication;Weak lingual  manipulation;Incomplete tongue to palate contact;Holding of  bolus;Piecemeal swallowing;Lingual/palatal residue;Delayed oral  transit Oral - Pill: Weak lingual manipulation;Holding of bolus;Delayed  oral transit   Pharyngeal Phase Pharyngeal Phase Pharyngeal Phase: Impaired Pharyngeal - Nectar Pharyngeal - Nectar Cup: Premature spillage to valleculae;Reduced  pharyngeal peristalsis;Reduced airway/laryngeal closure;Reduced  tongue base retraction;Pharyngeal residue - cp segment Pharyngeal - Thin Pharyngeal - Thin Cup: Premature spillage to valleculae;Reduced  airway/laryngeal closure;Penetration/Aspiration before  swallow;Penetration/Aspiration during swallow;Trace  aspiration;Pharyngeal residue - cp segment Penetration/Aspiration details (thin cup): Material enters  airway, CONTACTS cords then ejected out Pharyngeal - Thin Straw: Premature spillage  to valleculae;Reduced  pharyngeal peristalsis;Reduced tongue base retraction;Reduced  airway/laryngeal closure Pharyngeal - Solids Pharyngeal - Puree: Premature spillage to valleculae;Reduced  tongue base retraction;Pharyngeal residue - cp segment;Pharyngeal  residue - pyriform sinuses Pharyngeal - Regular: Premature spillage to valleculae;Reduced  tongue base retraction;Pharyngeal residue - cp segment Pharyngeal - Pill: Delayed swallow initiation  Cervical Esophageal Phase    GO    Cervical Esophageal Phase Cervical Esophageal Phase: Impaired Cervical Esophageal Phase - Thin Thin Cup: Esophageal backflow into cervical esophagus;Prominent  cricopharyngeal segment;Reduced cricopharyngeal relaxation Cervical Esophageal Phase - Solids Regular: Reduced cricopharyngeal relaxation;Prominent  cricopharyngeal segment;Esophageal backflow into  cervical  esophagus Pill: Esophageal backflow into cervical esophagus;Reduced  cricopharyngeal relaxation;Prominent cricopharyngeal segment        Moreen Fowler MS, CCC-SLP [phone] [facility] 04/15/2012, 2:32 PM        ASSESSMENT: CVA s/p mechanical MVR, TV repair; atrial flutter on tele per EP  PLAN:  Target INR 2.5-3.5 given mitral valve replacement and CVA.  Valve sounds normal on exam.  Continue heparin today as INR may drop slightly when heparin stopped.  2.4 today.  Would like to see INR about 2.7 before heparin is stopped.  We'll plan for check of INR early next week as an outpatient.  I stressed the importance of not eating too many vitamin K rich foods.  Of note, she was on Coumadin 6 mg daily at home when her INR dropped prior to the CVA.  Pharmacy currently dosing Coumadin.  She may need 8 mg of Coumadin at least several days a week to maintain higher INR target.  Rate control for AFlutter at this time.  Increase metoprolol.  Use calcium channel blocker if BP becomes significantly elevated.  Would have to be anticoagulated for a month prior to any attempt at cardioversion.  Cardiomyopathy diagnosed by echo.  Likely related to MVR and loss of low resistance outflow (mitral regurgitation).  Evidence of fluid overload on echo.  WIll diurese with lasix to avoid CHF sx. titrate beta blocker.  Corky Crafts., MD  04/18/2012  9:57 AM

## 2012-04-19 LAB — BASIC METABOLIC PANEL
BUN: 15 mg/dL (ref 6–23)
Calcium: 9.6 mg/dL (ref 8.4–10.5)
GFR calc Af Amer: 84 mL/min — ABNORMAL LOW (ref >90)
GFR calc non Af Amer: 73 mL/min — ABNORMAL LOW (ref >90)
Glucose, Bld: 141 mg/dL — ABNORMAL HIGH (ref 70–99)
Sodium: 139 mEq/L (ref 135–145)

## 2012-04-19 LAB — PROTIME-INR: Prothrombin Time: 29.4 seconds — ABNORMAL HIGH (ref 11.6–15.2)

## 2012-04-19 LAB — GLUCOSE, CAPILLARY: Glucose-Capillary: 123 mg/dL — ABNORMAL HIGH (ref 70–99)

## 2012-04-19 LAB — CBC
MCV: 89.8 fL (ref 78.0–100.0)
Platelets: 220 10*3/uL (ref 150–400)
RBC: 3.83 MIL/uL — ABNORMAL LOW (ref 3.87–5.11)
RDW: 15.2 % (ref 11.5–15.5)
WBC: 5.4 10*3/uL (ref 4.0–10.5)

## 2012-04-19 MED ORDER — WARFARIN SODIUM 4 MG PO TABS: 8.0000 mg | ORAL_TABLET | Freq: Every day | ORAL | Status: DC

## 2012-04-19 MED ORDER — WARFARIN SODIUM 6 MG PO TABS: 6.0000 mg | ORAL_TABLET | Freq: Every day | ORAL | Status: DC

## 2012-04-19 MED ORDER — WARFARIN SODIUM 6 MG PO TABS
6.0000 mg | ORAL_TABLET | Freq: Once | ORAL | Status: DC
  Filled 2012-04-19: qty 1

## 2012-04-19 NOTE — Progress Notes (Signed)
Pt avs and d/c instruction given Iv removed . Prescription for caumadin given and  Follow up appointment reinforced the patient and daughter verbalized good understanding. Condition at discharge is . Azzie Roup RN

## 2012-04-19 NOTE — Discharge Summary (Signed)
Physician Discharge Summary  Patient ID: [name] MRN: [id number] DOB/AGE: 1955-11-10 57 y.o.  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 Hypertension . Diabetes mellitus . new systolic CHF (congestive heart failure) . 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 Eldridge Dace  Discharge Instructions: Please note that your coumadin dose has been changed. Take 6mg  from Monday to Friday. 8mg  on Saturday and Sunday. PT/INR check on Monday 04/23/12.  Discharge Medications:   Medication List    STOP taking these medications       amiodarone 200 MG tablet  Commonly known as:  PACERONE      TAKE these medications       albuterol 108 (90 BASE) MCG/ACT inhaler  Commonly known as:  PROVENTIL HFA;VENTOLIN HFA  Inhale 2 puffs into the lungs every 6 (six) hours as needed. For wheezing     calcium carbonate 1250 MG tablet  Commonly known as:  OS-CAL - dosed in mg of elemental calcium  Take 1 tablet by mouth daily.     DIOVAN 320 MG tablet  Generic drug:  valsartan  Take 320 mg by mouth daily.     Fish Oil 1000 MG Caps  Take 1 capsule by mouth daily.     furosemide 40 MG tablet  Commonly known as:  LASIX  Take 1 tablet (40 mg total) by mouth 2 (two) times daily.     LANTUS SOLOSTAR 100 UNIT/ML injection  Generic drug:  insulin glargine  Inject 30-35 Units into the skin See admin instructions. Inject 30 units once daily and inject 35 units daily at bedtime.     LIPITOR 40 MG tablet  Generic drug:  atorvastatin  Take 40 mg by mouth every evening. Once daily     metoprolol tartrate 25 MG tablet  Commonly  known as:  LOPRESSOR  Take 1 tablet (25 mg total) by mouth 2 (two) times daily.     oxyCODONE 5 MG immediate release tablet  Commonly known as:  Oxy IR/ROXICODONE  Take 5-10 mg by mouth every 4 (four) hours as needed. For pain     potassium chloride SA 20 MEQ tablet  Commonly known as:  K-DUR,KLOR-CON  Take 1 tablet (20 mEq total) by mouth daily.     sitaGLIPtan-metformin 50-1000 MG per tablet  Commonly known as:  JANUMET  Take 1 tablet by mouth daily.     Vitamin D 1000 UNITS capsule  Take 1,000 Units by mouth daily.     warfarin 6 MG tablet  Commonly known as:  COUMADIN  Take 1 tablet (6 mg total) by mouth daily. Take MONDAY TO Friday     warfarin 4 MG tablet  Commonly known as:  COUMADIN  Take 2 tablets (8 mg total) by mouth daily. On SATURDAYS AND SUNDAYS         Brief H and P: For complete details please refer to admission H and P, but in brief [name] is a 57 y.o. 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 Probably last time she was seen normal was at 8: 50. She was found by her son sitting in the couch, weak, unable to speak. Patient was initially a code stroke, patient was not candidate for TPA due to elevated INR at 1.8. Patient was admitted for stroke workup  Hospital Course:  [name] is an 57 y.o. female with a history of diabetes mellitus, hypertension, hyperlipidemia, atrial fibrillation, and mitral valve replacement and tricuspid valve repair in January 2014, presented with acute onset speech difficulty and right-sided weakness. Patient was last known well at 9 AM on the day of admission. She had no previous history of stroke nor TIA. Patient has been on Coumadin for anticoagulation. INR was 1.84 today. As such she was not deemed a candidate for intravenous thrombolytic therapy with TPA.  NIH stroke score was 8. She was admitted to the neuro stepdown unit for further evaluation and treatment. 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 Eldridge Dace  Hypertension - stable  Diabetes mellitus  Poorly controlled w/ HbA1c of 8.7  Hx of Chronic Atrial fibrillation / atypical flutter  - S/p MAZE at time of valve surgery. Patient would have to be adequately treated for a month prior to any attempt at cardioversion. Amiodarone was discontinued by cardiology.   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 Marquis Lunch and recommended 6 mg for Monday-Friday and 8 mg for 2 days. Repeat INR check on 04/23/2012  Newly appreciated systolic CHF  TTE reveals EF of 20-25% - this is new since Dec 2013 when TEE revealed EF of 55-60% . She was closely followed by cardiology, she was diuresed, discharged Lasix 80 mg daily, beta blocker and ARB. She has followup appointment with Dr. Eldridge Dace.   Day of Discharge BP 143/85  Pulse 91  Temp(Src) 98 F (36.7 C) (Oral)  Resp 16  Ht 5\' 6"  (1.676 m)  Wt 98.4 kg (216 lb 14.9 oz)  BMI 35.03 kg/m2  SpO2 100%  Physical Exam: General: Alert and awake oriented x3 not in any acute distress. HEENT: anicteric sclera, pupils reactive to light  and accommodation CVS: S1-S2 clear no murmur rubs or gallops Chest: clear to auscultation bilaterally, no wheezing rales or rhonchi Abdomen: soft nontender, nondistended, normal bowel sounds Extremities: no cyanosis, clubbing or edema noted bilaterally    The results of significant diagnostics from this hospitalization (including imaging, microbiology, ancillary and laboratory) are listed below for reference.    LAB RESULTS: Basic Metabolic Panel:  Recent Labs Lab 04/16/12 0450 04/19/12 0740  NA 137 139  K 4.2 4.1  CL 106 106  CO2 23 26  GLUCOSE 130* 141*  BUN 14 15  CREATININE 0.79 0.87  CALCIUM 9.4 9.6  MG 2.2  --    Liver  Function Tests:  Recent Labs Lab 04/14/12 1225  AST 27  ALT 20  ALKPHOS 123*  BILITOT 0.7  PROT 8.2  ALBUMIN 3.6   No results found for this basename: LIPASE, AMYLASE,  in the last 168 hours No results found for this basename: AMMONIA,  in the last 168 hours CBC:  Recent Labs Lab 04/14/12 1225  04/18/12 0635 04/19/12 0740  WBC 5.9  < > 5.9 5.4  NEUTROABS 3.7  --   --   --   HGB 11.3*  < > 10.7* 10.9*  HCT 34.5*  < > 33.1* 34.4*  MCV 87.3  < > 88.5 89.8  PLT 195  < > 211 220  < > = values in this interval not displayed. Cardiac Enzymes:  Recent Labs Lab 04/14/12 1225  TROPONINI <0.30   BNP: No components found with this basename: POCBNP,  CBG:  Recent Labs Lab 04/18/12 2136 04/19/12 0725  GLUCAP 111* 123*    Significant Diagnostic Studies:  Dg Chest 2 View  04/15/2012  *RADIOLOGY REPORT*  Clinical Data: History of stroke.  CHEST - 2 VIEW  Comparison: 03/19/2012  Findings: Patient has had median sternotomy and valve replacement. Heart is enlarged.  There is diffuse, interstitial pulmonary edema. No focal consolidations or definite pleural effusions are identified.  IMPRESSION: Cardiomegaly and interstitial edema.   Original Report Authenticated By: Norva Pavlov, M.D.    Ct Head (brain) Wo Contrast  04/14/2012   *RADIOLOGY REPORT*  Clinical Data: Right-sided facial droop, right-sided weakness and lethargy.  CT HEAD WITHOUT CONTRAST  Technique:  Contiguous axial images were obtained from the base of the skull through the vertex without contrast.  Comparison: None.  Findings: The brain demonstrates no evidence of hemorrhage, infarction, edema, mass effect, extra-axial fluid collection, hydrocephalus or mass lesion.  The skull is unremarkable.  IMPRESSION: No acute findings.   Original Report Authenticated By: Irish Lack, M.D.    Mr [facility] Wo Contrast  04/15/2012  *RADIOLOGY REPORT*  Clinical Data:  Atrial fibrillation.  Valve replacement.  Diabetic hypertensive patient.  Developed slurred speech and right arm weakness.  MRI BRAIN WITHOUT CONTRAST MRA HEAD WITHOUT CONTRAST  Technique: Multiplanar, multiecho pulse sequences of the brain and surrounding structures were obtained according to standard protocol without intravenous contrast.  Angiographic images of the head were obtained using MRA technique without contrast.  Comparison: 04/14/2012 head CT.  No comparison brain MR.  MRI HEAD  Findings:  Motion degraded exam.  Scattered left hemispheric/middle cerebral artery distribution infarcts involving portions of the left insula/periopercular region and left frontal lobe.  Punctate blood breakdown products posterior left frontal lobe is not associated with acute infarct and may be related to the prior tiny area of hemorrhagic ischemia.  No other areas of intracranial hemorrhage.  Small vessel disease type changes most notable left periventricular region.  Mild atrophy without hydrocephalus.  No intracranial mass lesion detected on this unenhanced exam.  Retention cyst left maxillary sinus.  Minimal opacification anterior left ethmoid sinus.  Minimal exophthalmos.  IMPRESSION: Scattered left hemispheric/middle cerebral artery distribution infarcts involving portions of the left insula/periopercular region and left  frontal lobe.  Please see above.  MRA HEAD  Findings: Ectatic cavernous and supraclinoid segment of the internal carotid artery bilaterally with areas of mild narrowing (most notable left supraclinoid internal carotid artery just proximal to the carotid terminus).  Mild to moderate narrowing of the A1 segment of the anterior cerebral artery bilaterally.  Middle cerebral artery branch vessel irregularity  bilaterally. Decreased number of visualized left middle cerebral artery branches consistent with the patient's acute infarct.  M1 segment of the left middle cerebral artery does not appear significantly narrowed.  Right vertebral artery is dominant.  Narrowing of the distal left vertebral artery.  Mild irregularity of the PICA bilaterally.  Mild narrowing mid aspect of the basilar artery.  Nonvisualization AICAs.  High-grade stenosis proximal right posterior cerebral artery with abrupt occlusion of the right posterior cerebral artery proximal to the distal branches.  Moderate to marked narrowing at the bifurcation of the left posterior cerebral artery.  Mild to moderate narrowing superior cerebellar arteries bilaterally.  No aneurysm or vascular malformation is noted.  IMPRESSION: Intracranial atherosclerotic type changes as detailed above.  Most significant abnormality is that involving the posterior cerebral arteries (more notable on the right).  Decreased number of visualized left middle cerebral artery branch vessels consistent with the patient's acute left middle cerebral artery distribution infarct.  No significant stenosis of the M1 segment of the left middle cerebral artery.  Narrowing of the supraclinoid aspect of the left internal carotid artery.  Please see above.  Critical Value/emergent results were called by telephone at the time of interpretation on 04/15/2012 at 11:42 a.m. to [facility] the patient's nurse, who verbally acknowledged these results.   Original Report Authenticated By: Lacy Duverney, M.D.     Mr Brain Wo Contrast  04/15/2012  *RADIOLOGY REPORT*  Clinical Data:  Atrial fibrillation.  Valve replacement.  Diabetic hypertensive patient.  Developed slurred speech and right arm weakness.  MRI BRAIN WITHOUT CONTRAST MRA HEAD WITHOUT CONTRAST  Technique: Multiplanar, multiecho pulse sequences of the brain and surrounding structures were obtained according to standard protocol without intravenous contrast.  Angiographic images of the head were obtained using MRA technique without contrast.  Comparison: 04/14/2012 head CT.  No comparison brain MR.  MRI HEAD  Findings:  Motion degraded exam.  Scattered left hemispheric/middle cerebral artery distribution infarcts involving portions of the left insula/periopercular region and left frontal lobe.  Punctate blood breakdown products posterior left frontal lobe is not associated with acute infarct and may be related to the prior tiny area of hemorrhagic ischemia.  No other areas of intracranial hemorrhage.  Small vessel disease type changes most notable left periventricular region.  Mild atrophy without hydrocephalus.  No intracranial mass lesion detected on this unenhanced exam.  Retention cyst left maxillary sinus.  Minimal opacification anterior left ethmoid sinus.  Minimal exophthalmos.  IMPRESSION: Scattered left hemispheric/middle cerebral artery distribution infarcts involving portions of the left insula/periopercular region and left frontal lobe.  Please see above.  MRA HEAD  Findings: Ectatic cavernous and supraclinoid segment of the internal carotid artery bilaterally with areas of mild narrowing (most notable left supraclinoid internal carotid artery just proximal to the carotid terminus).  Mild to moderate narrowing of the A1 segment of the anterior cerebral artery bilaterally.  Middle cerebral artery branch vessel irregularity bilaterally. Decreased number of visualized left middle cerebral artery branches consistent with the patient's acute infarct.  M1  segment of the left middle cerebral artery does not appear significantly narrowed.  Right vertebral artery is dominant.  Narrowing of the distal left vertebral artery.  Mild irregularity of the PICA bilaterally.  Mild narrowing mid aspect of the basilar artery.  Nonvisualization AICAs.  High-grade stenosis proximal right posterior cerebral artery with abrupt occlusion of the right posterior cerebral artery proximal to the distal branches.  Moderate to marked narrowing at the bifurcation of the left posterior cerebral  artery.  Mild to moderate narrowing superior cerebellar arteries bilaterally.  No aneurysm or vascular malformation is noted.  IMPRESSION: Intracranial atherosclerotic type changes as detailed above.  Most significant abnormality is that involving the posterior cerebral arteries (more notable on the right).  Decreased number of visualized left middle cerebral artery branch vessels consistent with the patient's acute left middle cerebral artery distribution infarct.  No significant stenosis of the M1 segment of the left middle cerebral artery.  Narrowing of the supraclinoid aspect of the left internal carotid artery.  Please see above.  Critical Value/emergent results were called by telephone at the time of interpretation on 04/15/2012 at 11:42 a.m. to [facility] the patient's nurse, who verbally acknowledged these results.   Original Report Authenticated By: Lacy Duverney, M.D.    Dg Swallowing Func-speech Pathology  04/15/2012  Lacinda Axon, CCC-SLP     04/15/2012  2:51 PM Objective Swallowing Evaluation: Bedside swallow evaluation  Patient Details  Name: JEANELL MANGAN MRN: [id number] Date of Birth: 10/31/1955  Today's Date: 04/15/2012 Time: 1120-1140 SLP Time Calculation (min): 20 min  Past Medical History:  Past Medical History  Diagnosis Date  . Asthma   . Hypertension   . Obesity   . Chronic diastolic congestive heart failure   . Carotid bruit   . Hypercholesterolemia   . Polyp of rectum   . Abnormal  chest CT   . Persistent atrial fibrillation   . Bronchospasm 1998  . Atrial enlargement, left     severe  . Heart murmur   . Sleep apnea     DOES NOT HAVE CPAP  . Diabetes mellitus     insulin dependent  . Mitral regurgitation   . Tricuspid regurgitation 01/16/2012  . Rheumatic fever 01/16/2012    Reported during childhood  . Atrial fibrillation 01/05/2011    Chronic persistent, failed DCCV   . Obesity (BMI 30-39.9) 01/16/2012  . Shortness of breath     with exertion  . History of blood transfusion   . 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 S/P Maze operation for atrial fibrillation 02/15/2012    Complete biatrial lesion set using bipolar radiofrequency and  cryothermy ablation with clipping of LA appendage   Past Surgical History:  Past Surgical History  Procedure Laterality Date  . Cardiovascular stress test  09/2010  . Cardiac catheterization  >5 years  . Cardioversion  03/25/2011    Procedure: CARDIOVERSION;  Surgeon: Corky Crafts, MD;   Location: [facility] OR;  Service: Cardiovascular;  Laterality: N/A;  . Tee without cardioversion  12/21/2011    Procedure: TRANSESOPHAGEAL ECHOCARDIOGRAM (TEE);  Surgeon:  Corky Crafts, MD;  Location: [facility] ENDOSCOPY;  Service:  Cardiovascular;  Laterality: N/A;  . No past surgeries    . Tricuspid valve replacement  02/15/2012    Procedure: TRICUSPID VALVE REPAIR;  Surgeon: Purcell Nails,  MD;  Location: [facility] OR;  Service: Open Heart Surgery;  Laterality:  N/A;  . Maze  02/15/2012    Procedure: MAZE;  Surgeon: Purcell Nails, MD;  Location: [facility]  OR;  Service: Open Heart Surgery;  Laterality: N/A;  . Intraoperative transesophageal echocardiogram  02/15/2012    Procedure: INTRAOPERATIVE TRANSESOPHAGEAL ECHOCARDIOGRAM;   Surgeon: Purcell Nails, MD;  Location: [facility] OR;  Service: Open  Heart Surgery;  Laterality: N/A;  . Mitral valve  replacement  02/15/2012    Procedure: MITRAL VALVE (MV) REPLACEMENT;  Surgeon: Purcell Nails, MD;  Location: [facility] OR;  Service: Open Heart Surgery;   Laterality: N/A;   HPI:    KIYONNA TORTORELLI is a 57 y.o. 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  Probably last time she was seen normal was at 8: 50. She was  found by her son sitting in the couch, weak, unable to speak.  Patient was initially a code stroke, patient was not candidate  for TPA due to elevated INR at 1.8. During my evaluation patient  has expressive and dysarthric aphasia. She thought that her  daughter was her sister. MRI: Scattered left hemispheric/middle  cerebral artery distribution, infarcts involving portions of the  left insula/peripercular region and left frontal lobe.  Objective  assessment of MBS indicated due to results of BSE to assess risk  for aspiration.      Assessment / Plan / Recommendation Clinical Impression  Dysphagia Diagnosis: Mild oral phase dysphagia;Mild pharyngeal  phase dysphagia;Moderate cervical esophageal phase dysphagia Mild sensory motor oral dysphagia marked by decreased lingual  strength and coordination resulting in delay in oral transit,  oral holding, and piecemeal swallows.   Mild pharyngeal dysphagia  with deep penetration to cords initial swallow of thin liquid  barium by cup due to incomplete airway closure.  Patient sensed  penetration with subsequent effective throat clear.   Pharyngeal  swallow improved as evaluation proceeded as only one instance of  penetration occurring out of multiple trials of thin.  Minimal  pharyngeal residue at CP segment s/p swallow of puree, thin and  solid trials.  Brief esophageal screen indicates stasis in  thoracic esophagus with backflow of puree and thin liquids to  cervical esophagus.  Incomplete transit  with whole barium tablet  with stasis in thoracic esophagus.  Cued dry swallows and sips of  thin liquid ineffective in completing transit.  Recommend to  proceed with Dysphagia 3 diet consistency with thin liquids with  full supervision due to cognitive status.  Recommend modified  diet mainly due to oral phase dysphagia.  ST to follow in acute  care setting for diet tolerance and possible advancement.  No  repeat MBS warranted at this time for diet advancement.  Patient  may benefit from GI consult to assess esophageal functioning.     Treatment Recommendation  Therapy as outlined in treatment plan below    Diet Recommendation Dysphagia 3 (Mechanical Soft);Thin liquid   Liquid Administration via: Cup;Straw Medication Administration: Crushed with puree Supervision: Patient able to self feed;Full supervision/cueing  for compensatory strategies Compensations: Slow rate;Small sips/bites;Check for  pocketing;Multiple dry swallows after each bite/sip Postural Changes and/or Swallow Maneuvers: Out of bed for  meals;Seated upright 90 degrees;Upright 30-60 min after meal    Other  Recommendations Recommended Consults: Consider GI  evaluation Oral Care Recommendations: Oral care QID   Follow Up Recommendations  Outpatient SLP    Frequency and Duration min 2x/week  2 weeks       SLP Swallow Goals Patient will utilize recommended strategies during swallow to  increase swallowing safety with: Modified independent assistance   General Date of Onset: 04/14/12 Type of Study: Bedside swallow evaluation Reason for Referral: Objectively evaluate swallowing function Diet Prior to this Study: NPO Temperature Spikes Noted:  No Respiratory Status: Room air History of Recent Intubation: No Behavior/Cognition: Alert;Cooperative;Pleasant mood;Requires  cueing Oral Cavity - Dentition: Adequate natural dentition Oral Motor / Sensory Function: Impaired - see Bedside swallow  eval Self-Feeding Abilities: Needs assist;Able to feed self Patient  Positioning: Upright in chair Baseline Vocal Quality: Hoarse;Low vocal intensity Volitional Cough: Strong Volitional Swallow: Able to elicit Anatomy: Within functional limits Pharyngeal Secretions: Not observed secondary MBS    Reason for Referral Objectively evaluate swallowing function   Oral Phase Oral Preparation/Oral Phase Oral Phase: Impaired Oral - Nectar Oral - Nectar Cup: Weak lingual manipulation;Holding of  bolus;Piecemeal swallowing;Lingual/palatal residue;Delayed oral  transit Oral - Thin Oral - Thin Cup: Weak lingual manipulation;Delayed oral  transit;Lingual/palatal residue;Piecemeal swallowing;Holding of  bolus;Reduced posterior propulsion Oral - Solids Oral - Puree: Weak lingual manipulation;Reduced posterior  propulsion;Holding of bolus;Piecemeal swallowing;Lingual/palatal  residue;Delayed oral transit Oral - Regular: Impaired mastication;Weak lingual  manipulation;Incomplete tongue to palate contact;Holding of  bolus;Piecemeal swallowing;Lingual/palatal residue;Delayed oral  transit Oral - Pill: Weak lingual manipulation;Holding of bolus;Delayed  oral transit   Pharyngeal Phase Pharyngeal Phase Pharyngeal Phase: Impaired Pharyngeal - Nectar Pharyngeal - Nectar Cup: Premature spillage to valleculae;Reduced  pharyngeal peristalsis;Reduced airway/laryngeal closure;Reduced  tongue base retraction;Pharyngeal residue - cp segment Pharyngeal - Thin Pharyngeal - Thin Cup: Premature spillage to valleculae;Reduced  airway/laryngeal closure;Penetration/Aspiration before  swallow;Penetration/Aspiration during swallow;Trace  aspiration;Pharyngeal residue - cp segment Penetration/Aspiration details (thin cup): Material enters  airway, CONTACTS cords then ejected out Pharyngeal - Thin Straw: Premature spillage to valleculae;Reduced  pharyngeal peristalsis;Reduced tongue base retraction;Reduced  airway/laryngeal closure Pharyngeal - Solids Pharyngeal - Puree: Premature spillage to valleculae;Reduced  tongue base  retraction;Pharyngeal residue - cp segment;Pharyngeal  residue - pyriform sinuses Pharyngeal - Regular: Premature spillage to valleculae;Reduced  tongue base retraction;Pharyngeal residue - cp segment Pharyngeal - Pill: Delayed swallow initiation  Cervical Esophageal Phase    GO    Cervical Esophageal Phase Cervical Esophageal Phase: Impaired Cervical Esophageal Phase - Thin Thin Cup: Esophageal backflow into cervical esophagus;Prominent  cricopharyngeal segment;Reduced cricopharyngeal relaxation Cervical Esophageal Phase - Solids Regular: Reduced cricopharyngeal relaxation;Prominent  cricopharyngeal segment;Esophageal backflow into cervical  esophagus Pill: Esophageal backflow into cervical esophagus;Reduced  cricopharyngeal relaxation;Prominent cricopharyngeal segment        Moreen Fowler MS, CCC-SLP [phone] [facility] 04/15/2012, 2:32 PM      2D ECHO: 04/15/2012 Study Conclusions  - Left ventricle: There was mild focal basal hypertrophy of the septum. Systolic function was severely reduced. The estimated ejection fraction was in the range of 20% to 25%. Diffuse hypokinesis. - Ventricular septum: Septal motion showed paradox. - Aortic valve: Mild regurgitation. - Mitral valve: A mechanical prosthesis was present. - Left atrium: The atrium was moderately to severely dilated. - Right ventricle: Systolic function was mildly reduced. - Right atrium: The atrium was mildly dilated. - Pulmonary arteries: Systolic pressure was moderately increased. PA peak pressure: 45mm Hg (S). - Pericardium, extracardiac: A trivial pericardial effusion was identified posterior to the heart.     Disposition and Follow-up:     Discharge Orders   Future Appointments Provider Department Dept Phone   04/30/2012 10:30 AM [facility] [phone]   05/28/2012 1:00 PM Purcell Nails, MD [facility]  [phone]   Future Orders Complete By Expires     (HEART FAILURE PATIENTS) Call MD:  Anytime you have any of the following symptoms: 1) 3 pound weight gain in 24 hours or 5 pounds in 1 week 2) shortness of  breath, with or without a dry hacking cough 3) swelling in the hands, feet or stomach 4) if you have to sleep on extra pillows at night in order to breathe.  As directed     Diet Carb Modified  As directed     Discharge instructions  As directed     Comments:      Please note that your coumadin dose has been changed. Take 6mg  from Monday to Friday. 8mg  on Saturday and Sunday. PT/INR check on Monday 04/23/12.    Increase activity slowly  As directed         DISPOSITION: Home  DIET: Heart healthy diet ACTIVITY: As tolerated TESTS THAT NEED FOLLOW-UP PT/INR on 04/23/12  DISCHARGE FOLLOW-UP Follow-up Information   Follow up with Gates Rigg, MD. Schedule an appointment as soon as possible for a visit in 2 months. (stroke clinic)    Contact information:   [address] [place] [address] [phone]       Follow up with Corky Crafts., MD On 04/28/2012. (please call office to confirm appointment time.)    Contact information:   [address] [place] [address] [phone]       Time spent on Discharge: 40 mins  Signed:   Eddison Searls M.D. [facility] 04/19/2012, 3:58 PM Pager: [phone]

## 2012-04-19 NOTE — Progress Notes (Signed)
SUBJECTIVE:  Feels better.  Wants to go home.  OBJECTIVE:   Vitals:   Filed Vitals:   04/18/12 1720 04/18/12 2139 04/19/12 0159 04/19/12 0600  BP: 151/91 136/99 132/87 144/91  Pulse: 84 81 85 83  Temp: 97.7 F (36.5 C) 97.7 F (36.5 C) 98.4 F (36.9 C) 97.8 F (36.6 C)  TempSrc: Oral Oral Oral Oral  Resp: 18 18 18 18   Height:      Weight:      SpO2: 100% 94% 99% 97%   I&O's:    Intake/Output Summary (Last 24 hours) at 04/19/12 1610 Last data filed at 04/18/12 2100  Gross per 24 hour  Intake    720 ml  Output      5 ml  Net    715 ml   TELEMETRY: Reviewed telemetry pt in rate controlled atrial flutter     PHYSICAL EXAM General: Well developed, well nourished, in no acute distress, speech improved Head:   Normal cephalic and atramatic  Lungs:   Clear bilaterally to auscultation and percussion. Heart:  Rate controlled, crisp S1 click, normal S2 Abdomen:  abdomen soft and non-tender  Extremities:  Trace pretibial edema bilaterally Neuro: Alert and oriented X 3. Psych:  Normal affect, responds appropriately   LABS: Basic Metabolic Panel:  Recent Labs  96/04/54 0740  NA 139  K 4.1  CL 106  CO2 26  GLUCOSE 141*  BUN 15  CREATININE 0.87  CALCIUM 9.6   Liver Function Tests: No results found for this basename: AST, ALT, ALKPHOS, BILITOT, PROT, ALBUMIN,  in the last 72 hours No results found for this basename: LIPASE, AMYLASE,  in the last 72 hours CBC:  Recent Labs  04/18/12 0635 04/19/12 0740  WBC 5.9 5.4  HGB 10.7* 10.9*  HCT 33.1* 34.4*  MCV 88.5 89.8  PLT 211 220   Cardiac Enzymes: No results found for this basename: CKTOTAL, CKMB, CKMBINDEX, TROPONINI,  in the last 72 hours BNP: No components found with this basename: POCBNP,  D-Dimer: No results found for this basename: DDIMER,  in the last 72 hours Hemoglobin A1C: No results found for this basename: HGBA1C,  in the last 72 hours Fasting Lipid Panel: No results found for this basename:  CHOL, HDL, LDLCALC, TRIG, CHOLHDL, LDLDIRECT,  in the last 72 hours Thyroid Function Tests: No results found for this basename: TSH, T4TOTAL, FREET3, T3FREE, THYROIDAB,  in the last 72 hours Anemia Panel: No results found for this basename: VITAMINB12, FOLATE, FERRITIN, TIBC, IRON, RETICCTPCT,  in the last 72 hours Coag Panel:   Lab Results  Component Value Date   INR 2.98* 04/19/2012   INR 2.43* 04/18/2012   INR 2.56* 04/17/2012    RADIOLOGY: Dg Chest 2 View  04/15/2012  *RADIOLOGY REPORT*  Clinical Data: History of stroke.  CHEST - 2 VIEW  Comparison: 03/19/2012  Findings: Patient has had median sternotomy and valve replacement. Heart is enlarged.  There is diffuse, interstitial pulmonary edema. No focal consolidations or definite pleural effusions are identified.  IMPRESSION: Cardiomegaly and interstitial edema.   Original Report Authenticated By: Norva Pavlov, M.D.    Dg Chest 2 View  03/19/2012  *RADIOLOGY REPORT*  Clinical Data: History of heart surgery.  CHEST - 2 VIEW  Comparison: 02/24/2012.  Findings: Trachea is midline.  Heart is enlarged, stable.  Right PICC has been removed.  Lungs are somewhat low in volume but clear. No pleural fluid.  IMPRESSION: Low lung volumes without acute finding.   Original Report  Authenticated By: Leanna Battles, M.D.    Ct Head (brain) Wo Contrast  04/14/2012  *RADIOLOGY REPORT*  Clinical Data: Right-sided facial droop, right-sided weakness and lethargy.  CT HEAD WITHOUT CONTRAST  Technique:  Contiguous axial images were obtained from the base of the skull through the vertex without contrast.  Comparison: None.  Findings: The brain demonstrates no evidence of hemorrhage, infarction, edema, mass effect, extra-axial fluid collection, hydrocephalus or mass lesion.  The skull is unremarkable.  IMPRESSION: No acute findings.   Original Report Authenticated By: Irish Lack, M.D.    Mr [facility] Wo Contrast  04/15/2012  *RADIOLOGY REPORT*  Clinical Data:  Atrial  fibrillation.  Valve replacement.  Diabetic hypertensive patient.  Developed slurred speech and right arm weakness.  MRI BRAIN WITHOUT CONTRAST MRA HEAD WITHOUT CONTRAST  Technique: Multiplanar, multiecho pulse sequences of the brain and surrounding structures were obtained according to standard protocol without intravenous contrast.  Angiographic images of the head were obtained using MRA technique without contrast.  Comparison: 04/14/2012 head CT.  No comparison brain MR.  MRI HEAD  Findings:  Motion degraded exam.  Scattered left hemispheric/middle cerebral artery distribution infarcts involving portions of the left insula/periopercular region and left frontal lobe.  Punctate blood breakdown products posterior left frontal lobe is not associated with acute infarct and may be related to the prior tiny area of hemorrhagic ischemia.  No other areas of intracranial hemorrhage.  Small vessel disease type changes most notable left periventricular region.  Mild atrophy without hydrocephalus.  No intracranial mass lesion detected on this unenhanced exam.  Retention cyst left maxillary sinus.  Minimal opacification anterior left ethmoid sinus.  Minimal exophthalmos.  IMPRESSION: Scattered left hemispheric/middle cerebral artery distribution infarcts involving portions of the left insula/periopercular region and left frontal lobe.  Please see above.  MRA HEAD  Findings: Ectatic cavernous and supraclinoid segment of the internal carotid artery bilaterally with areas of mild narrowing (most notable left supraclinoid internal carotid artery just proximal to the carotid terminus).  Mild to moderate narrowing of the A1 segment of the anterior cerebral artery bilaterally.  Middle cerebral artery branch vessel irregularity bilaterally. Decreased number of visualized left middle cerebral artery branches consistent with the patient's acute infarct.  M1 segment of the left middle cerebral artery does not appear significantly  narrowed.  Right vertebral artery is dominant.  Narrowing of the distal left vertebral artery.  Mild irregularity of the PICA bilaterally.  Mild narrowing mid aspect of the basilar artery.  Nonvisualization AICAs.  High-grade stenosis proximal right posterior cerebral artery with abrupt occlusion of the right posterior cerebral artery proximal to the distal branches.  Moderate to marked narrowing at the bifurcation of the left posterior cerebral artery.  Mild to moderate narrowing superior cerebellar arteries bilaterally.  No aneurysm or vascular malformation is noted.  IMPRESSION: Intracranial atherosclerotic type changes as detailed above.  Most significant abnormality is that involving the posterior cerebral arteries (more notable on the right).  Decreased number of visualized left middle cerebral artery branch vessels consistent with the patient's acute left middle cerebral artery distribution infarct.  No significant stenosis of the M1 segment of the left middle cerebral artery.  Narrowing of the supraclinoid aspect of the left internal carotid artery.  Please see above.  Critical Value/emergent results were called by telephone at the time of interpretation on 04/15/2012 at 11:42 a.m. to [facility] the patient's nurse, who verbally acknowledged these results.   Original Report Authenticated By: Lacy Duverney, M.D.  Mr Brain Wo Contrast  04/15/2012  *RADIOLOGY REPORT*  Clinical Data:  Atrial fibrillation.  Valve replacement.  Diabetic hypertensive patient.  Developed slurred speech and right arm weakness.  MRI BRAIN WITHOUT CONTRAST MRA HEAD WITHOUT CONTRAST  Technique: Multiplanar, multiecho pulse sequences of the brain and surrounding structures were obtained according to standard protocol without intravenous contrast.  Angiographic images of the head were obtained using MRA technique without contrast.  Comparison: 04/14/2012 head CT.  No comparison brain MR.  MRI HEAD  Findings:  Motion degraded exam.   Scattered left hemispheric/middle cerebral artery distribution infarcts involving portions of the left insula/periopercular region and left frontal lobe.  Punctate blood breakdown products posterior left frontal lobe is not associated with acute infarct and may be related to the prior tiny area of hemorrhagic ischemia.  No other areas of intracranial hemorrhage.  Small vessel disease type changes most notable left periventricular region.  Mild atrophy without hydrocephalus.  No intracranial mass lesion detected on this unenhanced exam.  Retention cyst left maxillary sinus.  Minimal opacification anterior left ethmoid sinus.  Minimal exophthalmos.  IMPRESSION: Scattered left hemispheric/middle cerebral artery distribution infarcts involving portions of the left insula/periopercular region and left frontal lobe.  Please see above.  MRA HEAD  Findings: Ectatic cavernous and supraclinoid segment of the internal carotid artery bilaterally with areas of mild narrowing (most notable left supraclinoid internal carotid artery just proximal to the carotid terminus).  Mild to moderate narrowing of the A1 segment of the anterior cerebral artery bilaterally.  Middle cerebral artery branch vessel irregularity bilaterally. Decreased number of visualized left middle cerebral artery branches consistent with the patient's acute infarct.  M1 segment of the left middle cerebral artery does not appear significantly narrowed.  Right vertebral artery is dominant.  Narrowing of the distal left vertebral artery.  Mild irregularity of the PICA bilaterally.  Mild narrowing mid aspect of the basilar artery.  Nonvisualization AICAs.  High-grade stenosis proximal right posterior cerebral artery with abrupt occlusion of the right posterior cerebral artery proximal to the distal branches.  Moderate to marked narrowing at the bifurcation of the left posterior cerebral artery.  Mild to moderate narrowing superior cerebellar arteries bilaterally.  No  aneurysm or vascular malformation is noted.  IMPRESSION: Intracranial atherosclerotic type changes as detailed above.  Most significant abnormality is that involving the posterior cerebral arteries (more notable on the right).  Decreased number of visualized left middle cerebral artery branch vessels consistent with the patient's acute left middle cerebral artery distribution infarct.  No significant stenosis of the M1 segment of the left middle cerebral artery.  Narrowing of the supraclinoid aspect of the left internal carotid artery.  Please see above.  Critical Value/emergent results were called by telephone at the time of interpretation on 04/15/2012 at 11:42 a.m. to [facility] the patient's nurse, who verbally acknowledged these results.   Original Report Authenticated By: Lacy Duverney, M.D.    Dg Swallowing Func-speech Pathology  04/15/2012  Lacinda Axon, CCC-SLP     04/15/2012  2:51 PM Objective Swallowing Evaluation: Bedside swallow evaluation  Patient Details  Name: [name] MRN: [id number] Date of Birth: Dec 26, 1955  Today's Date: 04/15/2012 Time: 1120-1140 SLP Time Calculation (min): 20 min  Past Medical History:  Past Medical History  Diagnosis Date  . Asthma   . Hypertension   . Obesity   . Chronic diastolic congestive heart failure   . Carotid bruit   . Hypercholesterolemia   . Polyp of rectum   .  Abnormal chest CT   . Persistent atrial fibrillation   . Bronchospasm 1998  . Atrial enlargement, left     severe  . Heart murmur   . Sleep apnea     DOES NOT HAVE CPAP  . Diabetes mellitus     insulin dependent  . Mitral regurgitation   . Tricuspid regurgitation 01/16/2012  . Rheumatic fever 01/16/2012    Reported during childhood  . Atrial fibrillation 01/05/2011    Chronic persistent, failed DCCV   . Obesity (BMI 30-39.9) 01/16/2012  . Shortness of breath     with exertion  . History of blood transfusion   . 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 S/P Maze operation for atrial fibrillation 02/15/2012    Complete biatrial lesion set using bipolar radiofrequency and  cryothermy ablation with clipping of LA appendage   Past Surgical History:  Past Surgical History  Procedure Laterality Date  . Cardiovascular stress test  09/2010  . Cardiac catheterization  >5 years  . Cardioversion  03/25/2011    Procedure: CARDIOVERSION;  Surgeon: Corky Crafts, MD;   Location: [facility] OR;  Service: Cardiovascular;  Laterality: N/A;  . Tee without cardioversion  12/21/2011    Procedure: TRANSESOPHAGEAL ECHOCARDIOGRAM (TEE);  Surgeon:  Corky Crafts, MD;  Location: [facility] ENDOSCOPY;  Service:  Cardiovascular;  Laterality: N/A;  . No past surgeries    . Tricuspid valve replacement  02/15/2012    Procedure: TRICUSPID VALVE REPAIR;  Surgeon: Purcell Nails,  MD;  Location: [facility] OR;  Service: Open Heart Surgery;  Laterality:  N/A;  . Maze  02/15/2012    Procedure: MAZE;  Surgeon: Purcell Nails, MD;  Location: [place]  OR;  Service: Open Heart Surgery;  Laterality: N/A;  . Intraoperative transesophageal echocardiogram  02/15/2012    Procedure: INTRAOPERATIVE TRANSESOPHAGEAL ECHOCARDIOGRAM;   Surgeon: Purcell Nails, MD;  Location: [facility] OR;  Service: Open  Heart Surgery;  Laterality: N/A;  . Mitral valve replacement  02/15/2012    Procedure: MITRAL VALVE (MV) REPLACEMENT;  Surgeon: Purcell Nails, MD;  Location: [facility] OR;  Service: Open Heart Surgery;   Laterality: N/A;   HPI:    [name] is a 57 y.o. 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  Probably last time she was seen normal was at 8: 50. She was  found by her son sitting in the couch, weak, unable to speak.  Patient was initially a code stroke, patient was not candidate   for TPA due to elevated INR at 1.8. During my evaluation patient  has expressive and dysarthric aphasia. She thought that her  daughter was her sister. MRI: Scattered left hemispheric/middle  cerebral artery distribution, infarcts involving portions of the  left insula/peripercular region and left frontal lobe.  Objective  assessment of MBS indicated due to results of BSE to assess risk  for aspiration.      Assessment / Plan / Recommendation Clinical Impression  Dysphagia Diagnosis: Mild oral phase dysphagia;Mild pharyngeal  phase dysphagia;Moderate cervical esophageal phase dysphagia Mild sensory motor oral dysphagia marked by decreased lingual  strength and coordination resulting in delay in oral transit,  oral holding, and piecemeal swallows.   Mild pharyngeal dysphagia  with deep penetration to cords initial swallow of thin liquid  barium by cup due to incomplete airway closure.  Patient sensed  penetration with subsequent effective throat clear.   Pharyngeal  swallow improved as evaluation proceeded as only one instance of  penetration occurring out of multiple trials of thin.  Minimal  pharyngeal residue at CP segment s/p swallow of puree, thin and  solid trials.  Brief esophageal screen indicates stasis in  thoracic esophagus with backflow of puree and thin liquids to  cervical esophagus.  Incomplete transit with whole barium tablet  with stasis in thoracic esophagus.  Cued dry swallows and sips of  thin liquid ineffective in completing transit.  Recommend to  proceed with Dysphagia 3 diet consistency with thin liquids with  full supervision due to cognitive status.  Recommend modified  diet mainly due to oral phase dysphagia.  ST to follow in acute  care setting for diet tolerance and possible advancement.  No  repeat MBS warranted at this time for diet advancement.  Patient  may benefit from GI consult to assess esophageal functioning.     Treatment Recommendation  Therapy as outlined in treatment plan  below    Diet Recommendation Dysphagia 3 (Mechanical Soft);Thin liquid   Liquid Administration via: Cup;Straw Medication Administration: Crushed with puree Supervision: Patient able to self feed;Full supervision/cueing  for compensatory strategies Compensations: Slow rate;Small sips/bites;Check for  pocketing;Multiple dry swallows after each bite/sip Postural Changes and/or Swallow Maneuvers: Out of bed for  meals;Seated upright 90 degrees;Upright 30-60 min after meal    Other  Recommendations Recommended Consults: Consider GI  evaluation Oral Care Recommendations: Oral care QID   Follow Up Recommendations  Outpatient SLP    Frequency and Duration min 2x/week  2 weeks       SLP Swallow Goals Patient will utilize recommended strategies during swallow to  increase swallowing safety with: Modified independent assistance   General Date of Onset: 04/14/12 Type of Study: Bedside swallow evaluation Reason for Referral: Objectively evaluate swallowing function Diet Prior to this Study: NPO Temperature Spikes Noted: No Respiratory Status: Room air History of Recent Intubation: No Behavior/Cognition: Alert;Cooperative;Pleasant mood;Requires  cueing Oral Cavity - Dentition: Adequate natural dentition Oral Motor / Sensory Function: Impaired - see Bedside swallow  eval Self-Feeding Abilities: Needs assist;Able to feed self Patient Positioning: Upright in chair Baseline Vocal Quality: Hoarse;Low vocal intensity Volitional Cough: Strong Volitional Swallow: Able to elicit Anatomy: Within functional limits Pharyngeal Secretions: Not observed secondary MBS    Reason for Referral Objectively evaluate swallowing function   Oral Phase Oral Preparation/Oral Phase Oral Phase: Impaired Oral - Nectar Oral - Nectar Cup: Weak lingual manipulation;Holding of  bolus;Piecemeal swallowing;Lingual/palatal residue;Delayed oral  transit Oral - Thin Oral - Thin Cup: Weak lingual manipulation;Delayed oral  transit;Lingual/palatal residue;Piecemeal  swallowing;Holding of  bolus;Reduced posterior propulsion Oral - Solids Oral - Puree: Weak lingual manipulation;Reduced posterior  propulsion;Holding of bolus;Piecemeal swallowing;Lingual/palatal  residue;Delayed oral transit Oral - Regular: Impaired mastication;Weak lingual  manipulation;Incomplete tongue to palate contact;Holding of  bolus;Piecemeal swallowing;Lingual/palatal residue;Delayed oral  transit Oral - Pill: Weak lingual manipulation;Holding of bolus;Delayed  oral transit   Pharyngeal Phase Pharyngeal Phase Pharyngeal Phase: Impaired Pharyngeal - Nectar Pharyngeal - Nectar Cup: Premature spillage to valleculae;Reduced  pharyngeal peristalsis;Reduced airway/laryngeal closure;Reduced  tongue base retraction;Pharyngeal residue - cp segment Pharyngeal - Thin Pharyngeal - Thin Cup: Premature spillage to valleculae;Reduced  airway/laryngeal closure;Penetration/Aspiration before  swallow;Penetration/Aspiration during swallow;Trace  aspiration;Pharyngeal  residue - cp segment Penetration/Aspiration details (thin cup): Material enters  airway, CONTACTS cords then ejected out Pharyngeal - Thin Straw: Premature spillage to valleculae;Reduced  pharyngeal peristalsis;Reduced tongue base retraction;Reduced  airway/laryngeal closure Pharyngeal - Solids Pharyngeal - Puree: Premature spillage to valleculae;Reduced  tongue base retraction;Pharyngeal residue - cp segment;Pharyngeal  residue - pyriform sinuses Pharyngeal - Regular: Premature spillage to valleculae;Reduced  tongue base retraction;Pharyngeal residue - cp segment Pharyngeal - Pill: Delayed swallow initiation  Cervical Esophageal Phase    GO    Cervical Esophageal Phase Cervical Esophageal Phase: Impaired Cervical Esophageal Phase - Thin Thin Cup: Esophageal backflow into cervical esophagus;Prominent  cricopharyngeal segment;Reduced cricopharyngeal relaxation Cervical Esophageal Phase - Solids Regular: Reduced cricopharyngeal relaxation;Prominent   cricopharyngeal segment;Esophageal backflow into cervical  esophagus Pill: Esophageal backflow into cervical esophagus;Reduced  cricopharyngeal relaxation;Prominent cricopharyngeal segment        Moreen Fowler MS, CCC-SLP [phone] [facility] 04/15/2012, 2:32 PM        ASSESSMENT: CVA s/p mechanical MVR, TV repair; atrial flutter on tele per EP  PLAN:  Target INR 2.5-3.5 given mitral valve replacement and CVA.  Valve sounds normal on exam.  Okay to stop heparin today as INR  2.9 today.    We'll plan for check of INR on Monday next week as an outpatient.  I stressed the importance of not eating too many vitamin K rich foods.  Of note, she was on Coumadin 6 mg daily at home when her INR dropped prior to the CVA.  Pharmacy currently dosing Coumadin.  She may need 8 mg of Coumadin at least several days a week to maintain higher INR target.  Would send her home on Coumadin 6 mg 5 days a week, and 8 mg, 2 days a week.  Rate control for AFlutter at this time.  Continue metoprolol.  Use calcium channel blocker if BP becomes significantly elevated.  Would have to be anticoagulated for a month prior to any attempt at cardioversion.  Cardiomyopathy diagnosed by echo.  Likely related to MVR and loss of low resistance outflow (mitral regurgitation).  Evidence of fluid overload on echo.   diuresed with lasix to avoid CHF sx. titrate beta blocker.  Titrate Lasix dose at home based on weight.  Her weight is up a few pounds, she'll take an extra 40 mg dose.  She should be sent home on  80 mg daily.  She can call our office if she has any shortness of breath or swelling.  She will followup with me on April 19.  Corky Crafts., MD  04/19/2012  9:38 AM

## 2012-04-19 NOTE — Care Management Note (Signed)
    Page 1 of 1   04/19/2012     2:35:03 PM   CARE MANAGEMENT NOTE 04/19/2012  Patient:  [name], [name]   Account Number:  [id number]  Date Initiated:  04/17/2012  Documentation initiated by:  [facility]  Subjective/Objective Assessment:   CVA     Action/Plan:   Anticipated DC Date:  04/18/2012   Anticipated DC Plan:  HOME/SELF CARE      DC Planning Services  CM consult  OP [facility]      Choice offered to / List presented to:             Status of service:  Completed, signed off Medicare Important Message given?   (If response is "NO", the following Medicare IM given date fields will be blank) Date Medicare IM given:   Date Additional Medicare IM given:    Discharge Disposition:  HOME/SELF CARE  Per UR Regulation:  Reviewed for med. necessity/level of care/duration of stay  If discussed at Long Length of Stay Meetings, dates discussed:    Comments:  04/19/12 Made patient appt with [facility] on 04/30/12 at 10:30 for speech therapy.Gave patient appt time.Patient stated that she has an appt for INR on 04/23/12 at 1pm with Dr. Eldridge Dace.  04/17/2012 1600 NCM spoke to pt and provided directions to [facility] for SLP. Faxed referral to [facility] for SLP. Isidoro Donning RN CCM Case Mgmt phone [phone]

## 2012-04-19 NOTE — Progress Notes (Signed)
ANTICOAGULATION CONSULT NOTE - Follow-up Consult  Pharmacy Consult for UFH / Coumadin Indication: h/o afib/MVR  No Known Allergies  Labs:  Recent Labs  04/17/12 0500 04/18/12 0635 04/19/12 0740  HGB 10.8* 10.7* 10.9*  HCT 33.2* 33.1* 34.4*  PLT 210 211 220  LABPROT 26.3* 25.3* 29.4*  INR 2.56* 2.43* 2.98*  HEPARINUNFRC 0.51 0.26* 0.49  CREATININE  --   --  0.87    Estimated Creatinine Clearance: 84.4 ml/min (by C-G formula based on Cr of 0.87).   Assessment: 57 year old female admitted with acute CVA.  History of MV valve and valve repair with afib.  Heparin level = 0.49, INR = 2.98 (up from 2.43 after 8 mg)  Goal of Therapy:  Heparin level 0.3-0.5 units/ml Monitor platelets by anticoagulation protocol: Yes INR 2.5 - 3.5   Plan:  Continue IV heparin at 1300 units / hr  Coumadin 6 mg po x 1 dose today If home, recommend 8 mg x 2 days per week, 6 mg other days with close follow up  Thank you. Okey Regal, PharmD [phone]

## 2012-04-30 ENCOUNTER — Ambulatory Visit: Payer: BC Managed Care – PPO | Attending: Internal Medicine

## 2012-04-30 DIAGNOSIS — R4701 Aphasia: Secondary | ICD-10-CM | POA: Insufficient documentation

## 2012-04-30 DIAGNOSIS — IMO0001 Reserved for inherently not codable concepts without codable children: Secondary | ICD-10-CM | POA: Insufficient documentation

## 2012-05-07 ENCOUNTER — Ambulatory Visit: Payer: BC Managed Care – PPO

## 2012-05-09 ENCOUNTER — Other Ambulatory Visit: Payer: Self-pay | Admitting: Thoracic Surgery (Cardiothoracic Vascular Surgery)

## 2012-05-18 ENCOUNTER — Ambulatory Visit: Payer: BC Managed Care – PPO | Attending: Internal Medicine

## 2012-05-18 DIAGNOSIS — IMO0001 Reserved for inherently not codable concepts without codable children: Secondary | ICD-10-CM | POA: Insufficient documentation

## 2012-05-18 DIAGNOSIS — R4701 Aphasia: Secondary | ICD-10-CM | POA: Insufficient documentation

## 2012-05-21 ENCOUNTER — Other Ambulatory Visit: Payer: Self-pay | Admitting: Thoracic Surgery (Cardiothoracic Vascular Surgery)

## 2012-05-23 ENCOUNTER — Other Ambulatory Visit: Payer: Self-pay | Admitting: Thoracic Surgery (Cardiothoracic Vascular Surgery)

## 2012-05-25 ENCOUNTER — Other Ambulatory Visit: Payer: Self-pay | Admitting: *Deleted

## 2012-05-28 ENCOUNTER — Ambulatory Visit (INDEPENDENT_AMBULATORY_CARE_PROVIDER_SITE_OTHER): Payer: BC Managed Care – PPO | Admitting: Thoracic Surgery (Cardiothoracic Vascular Surgery)

## 2012-05-28 ENCOUNTER — Encounter: Payer: Self-pay | Admitting: Thoracic Surgery (Cardiothoracic Vascular Surgery)

## 2012-05-28 VITALS — BP 111/81 | HR 110 | Resp 20 | Ht 66.0 in | Wt 218.0 lb

## 2012-05-28 DIAGNOSIS — Z9889 Other specified postprocedural states: Secondary | ICD-10-CM

## 2012-05-28 DIAGNOSIS — Z954 Presence of other heart-valve replacement: Secondary | ICD-10-CM

## 2012-05-28 DIAGNOSIS — Z952 Presence of prosthetic heart valve: Secondary | ICD-10-CM

## 2012-05-28 NOTE — Progress Notes (Signed)
[address]            [name] [address]          [phone]     CARDIOTHORACIC SURGERY OFFICE NOTE  Referring Provider is [name]., *MD PCP is Natalie Humphrey, NP   HPI:  Patient returns for followup status post mitral valve replacement using a mechanical prosthesis, tricuspid valve repair, and Maze procedure on 02/15/2012.  She was last seen here in the office on 03/19/2012.  On April 5 she suffered a minor stroke manifested as dysarthria and mild right upper extremity weakness. At the time she was subtherapeutic on Coumadin with INR 1.8.  MRI of the head revealed scattered left hemispheric and middle cerebral artery distribution infarcts involving portions of the left insula and periopercular region and left frontal lobe.  During that hospitalization her rhythm was described as what appeared to be an atypical atrial flutter with left bundle branch block. Ventricular rate was well-controlled and stable.  An echocardiogram was performed demonstrating stable chronic severe left ventricular dysfunction with ejection fraction estimated 20-25%. Mechanical mitral valve prosthesis was functioning normally. Right ventricular size was normal with mild right ventricular systolic dysfunction. There was mild mitral regurgitation. Clinically the patient recovered quite rapidly and was discharged from the hospital several days later. Since then she has had her Coumadin dose adjusted carefully through the [facility] office.  She quit physical therapy after just a few visits and she has now already gone back to work.  She reports no shortness of breath. She still has very mild soreness across her chest but this continues to improve. She denies any tachypalpitations or dizzy spells. She does report some mild lower extremity edema. She's not recording her weight. She states that her appetite is still not back to normal and that food doesn'[name] taste right. She  has no other complaints.    Current Outpatient Prescriptions  Medication Sig Dispense Refill  . albuterol (PROVENTIL HFA;VENTOLIN HFA) 108 (90 BASE) MCG/ACT inhaler Inhale 2 puffs into the lungs every 6 (six) hours as needed. For wheezing      . calcium carbonate (OS-CAL - DOSED IN MG OF ELEMENTAL CALCIUM) 1250 MG tablet Take 1 tablet by mouth daily.        . Cholecalciferol (VITAMIN D) 1000 UNITS capsule Take 1,000 Units by mouth daily.       [name] DIOVAN 320 MG tablet Take 320 mg by mouth daily.       . furosemide (LASIX) 40 MG tablet Take 1 tablet (40 mg total) by mouth 2 (two) times daily.  60 tablet  11  . insulin glargine (LANTUS SOLOSTAR) 100 UNIT/ML injection Inject 30-35 Units into the skin See admin instructions. Inject 30 units once daily and inject 35 units daily at bedtime.      [name] LIPITOR 40 MG tablet Take 40 mg by mouth every evening. Once daily      . metoprolol tartrate (LOPRESSOR) 25 MG tablet Take 1 tablet (25 mg total) by mouth 2 (two) times daily.  60 tablet  1  . Omega-3 Fatty Acids (FISH OIL) 1000 MG CAPS Take 1 capsule by mouth daily.       [name] oxyCODONE (OXY IR/ROXICODONE) 5 MG immediate release tablet Take 5-10 mg by mouth every 4 (four) hours as needed. For pain      . potassium chloride SA (K-DUR,KLOR-CON) 20  MEQ tablet Take 1 tablet (20 mEq total) by mouth daily.  30 tablet  11  . sitaGLIPtan-metformin (JANUMET) 50-1000 MG per tablet Take 1 tablet by mouth daily.      [name] warfarin (COUMADIN) 4 MG tablet Take 2 tablets (8 mg total) by mouth daily. On SATURDAYS AND SUNDAYS  10 tablet  1  . warfarin (COUMADIN) 6 MG tablet Take 1 tablet (6 mg total) by mouth daily. Take MONDAY TO Friday  30 tablet  0  . [DISCONTINUED] flecainide (TAMBOCOR) 100 MG tablet Take 1 tablet (100 mg total) by mouth every 12 (twelve) hours.  60 tablet  11   No current facility-administered medications for this visit.      Physical Exam:   BP 111/81  Pulse 110  Resp 20  Ht 5\' 6"  (1.676 m)  Wt  218 lb (98.884 kg)  BMI 35.2 kg/m2  SpO2 98%  General:  Well-appearing  Chest:   Clear to auscultation with symmetrical breath sounds  CV:   Regular rate and rhythm with mechanical heart sounds, no murmur appreciated  Incisions:  Sternotomy scar is healed nicely and the sternum is stable  Abdomen:  Soft and nontender  Extremities:  Warm and well-perfused with mild bilateral lower extremity edema  Diagnostic Tests:  Transthoracic Echocardiography  Patient: Natalie, [name] MR #: [id number] Study Date: 04/15/2012 Gender: F Age: 57 Height: 167.6cm Weight: 98.4kg BSA: 2.65m^2 Pt. Status: Room: [facility], Ec ATTENDING [name] SONOGRAPHER [name], RCS ADMITTING [name] ORDERING [name]:  ------------------------------------------------------------ LV EF: 20% - 25%  ------------------------------------------------------------ Indications: CVA 436.  ------------------------------------------------------------ History: PMH: Congestive heart failure. Risk factors: Hypertension. Diabetes mellitus. Obese. Dyslipidemia.  ------------------------------------------------------------ Study Conclusions  - Left ventricle: There was mild focal basal hypertrophy of the septum. Systolic function was severely reduced. The estimated ejection fraction was in the range of 20% to 25%. Diffuse hypokinesis. - Ventricular septum: Septal motion showed paradox. - Aortic valve: Mild regurgitation. - Mitral valve: A mechanical prosthesis was present. - Left atrium: The atrium was moderately to severely dilated. - Right ventricle: Systolic function was mildly reduced. - Right atrium: The atrium was mildly dilated. - Pulmonary arteries: Systolic pressure was moderately increased. PA peak pressure: 45mm Hg (S). - Pericardium, extracardiac: A trivial pericardial effusion was identified posterior to the  heart.  ------------------------------------------------------------ Labs, prior tests, procedures, and surgery: Valve surgery. Mitral valve replacement.  Valve surgery. Tricuspid valve repair. Maze procedure.  Transthoracic echocardiography. M-mode, complete 2D, spectral Doppler, and color Doppler. Height: Height: 167.6cm. Height: 66in. Weight: Weight: 98.4kg. Weight: 216.5lb. Body mass index: BMI: 35kg/m^2. Body surface area: BSA: 2.47m^2. Blood pressure: 148/102. Patient status: Inpatient. Location: ICU/CCU  ------------------------------------------------------------  ------------------------------------------------------------ Left ventricle: There was mild focal basal hypertrophy of the septum. Systolic function was severely reduced. The estimated ejection fraction was in the range of 20% to 25%. Diffuse hypokinesis.  ------------------------------------------------------------ Aortic valve: Trileaflet. Sclerosis without stenosis. Doppler: Mild regurgitation.  ------------------------------------------------------------ Mitral valve: Artifact from metallic valve present. A mechanical prosthesis was present. Doppler: No significant regurgitation. Mean gradient: 6mm Hg (D). Peak gradient: 12mm Hg (D).  ------------------------------------------------------------ Left atrium: The atrium was moderately to severely dilated.  ------------------------------------------------------------ Right ventricle: The cavity size was normal. Systolic function was mildly reduced.  ------------------------------------------------------------ Ventricular septum: Septal motion showed paradox.  ------------------------------------------------------------ Pulmonic valve: The valve appears to be grossly normal. Doppler: Trivial regurgitation.  ------------------------------------------------------------ Tricuspid valve: Mildly thickened leaflets. Doppler: Mild regurgitation. Mean  gradient: 4mm Hg (D). Peak  gradient: 7mm Hg (D).  ------------------------------------------------------------ Pulmonary artery: The main pulmonary artery was dilated. Systolic pressure was moderately increased.  ------------------------------------------------------------ Right atrium: The atrium was mildly dilated.  ------------------------------------------------------------ Pericardium: A trivial pericardial effusion was identified posterior to the heart.  ------------------------------------------------------------ Systemic veins: Inferior vena cava: The vessel was dilated; the respirophasic diameter changes were blunted (< 50%); findings are consistent with elevated central venous pressure.  ------------------------------------------------------------  2D measurements Normal Doppler measurements Normal Left ventricle Main pulmonary LVID ED, 48 mm 43-52 artery chord, Pressure, S 45 mm =30 PLAX Hg LVID ES, 38.5 mm 23-38 Mitral valve chord, Mean vel, D 114 cm/s ------ PLAX Mean 6 mm ------ FS, chord, 20 % >29 gradient, D Hg PLAX Peak 12 mm ------ LVPW, ED 11.6 mm ------ gradient, D Hg IVS/LVPW 1.09 <1.3 Annulus VTI 16.2 cm ------ ratio, ED Tricuspid valve Ventricular septum Mean vel, D 94.5 cm/s ------ IVS, ED 12.6 mm ------ Mean 4 mm ------ Aorta gradient, D Hg Root diam, 24 mm ------ Peak 7 mm ------ ED gradient, D Hg Left atrium Max inflow 131 cm/s ------ AP dim 60 mm ------ vel AP dim 2.75 cm/m^2 <2.2 VTI at 18.7 cm ------ index annulus Regurg peak 250 cm/s ------ vel Peak RV-RA 25 mm ------ gradient, S Hg Systemic veins Estimated 20 mm ------ CVP Hg Right ventricle Pressure, S 45 mm <30 Hg  ------------------------------------------------------------ Prepared and Electronically Authenticated by  Everette Rank [id number]:[id number]   2 channel rhythm strip performed in the office today demonstrates what appears to be atrial flutter or atrial  fibrillation with controlled ventricular rate   Impression:  The patient is doing remarkably well just 3 months following mitral valve replacement, tricuspid valve repair, and Maze procedure despite the fact that she suffered a minor stroke in early April at which time she was subtherapeutic on Coumadin. She does appear to be in atrial flutter or atrial fibrillation with controlled ventricular rate.  She has been taken off of amiodarone. She is reportedly therapeutic on Coumadin.    Plan:  We will defer any changes in medical therapy to Dr. Eldridge Dace and colleagues.  The patient will return in 3 months for routine followup and rhythm check.   Salvatore Decent. Cornelius Moras, MD 05/28/2012 1:30 PM

## 2012-06-14 ENCOUNTER — Encounter: Payer: Self-pay | Admitting: Internal Medicine

## 2012-06-14 ENCOUNTER — Ambulatory Visit (INDEPENDENT_AMBULATORY_CARE_PROVIDER_SITE_OTHER): Payer: BC Managed Care – PPO | Admitting: Internal Medicine

## 2012-06-14 ENCOUNTER — Telehealth: Payer: Self-pay | Admitting: Internal Medicine

## 2012-06-14 VITALS — BP 134/88 | HR 82 | Ht 67.0 in | Wt 218.6 lb

## 2012-06-14 DIAGNOSIS — Z9889 Other specified postprocedural states: Secondary | ICD-10-CM

## 2012-06-14 DIAGNOSIS — Z954 Presence of other heart-valve replacement: Secondary | ICD-10-CM

## 2012-06-14 DIAGNOSIS — I4891 Unspecified atrial fibrillation: Secondary | ICD-10-CM

## 2012-06-14 DIAGNOSIS — I4892 Unspecified atrial flutter: Secondary | ICD-10-CM

## 2012-06-14 MED ORDER — AMIODARONE HCL 200 MG PO TABS: 200.0000 mg | ORAL_TABLET | Freq: Two times a day (BID) | ORAL | Status: DC

## 2012-06-14 NOTE — Patient Instructions (Addendum)
Your physician recommends that you schedule a follow-up appointment as needed  Your physician has recommended you make the following change in your medication:  1) Start Amiodarone 200mg  1 by mouth twice daily  Dr Eldridge Dace will arrange cardioversion

## 2012-06-14 NOTE — Telephone Encounter (Addendum)
Spoke with [name] , she is coming in tomorrow for an INR

## 2012-06-14 NOTE — Telephone Encounter (Signed)
New problem   [name] need you to call him.

## 2012-06-17 NOTE — Progress Notes (Signed)
PCP:  Hulda Humphrey, NP Primary Cardiologist:  Dr Eldridge Dace  The patient presents today for routine electrophysiology followup.  Since I saw her last, she has had an eventful coarse.  She is s/p tricupid repair, mechanical MV, and Maze by Dr Cornelius Moras 2/14.   She was admitted with an acute stroke 4/14.  She was in an atypical flutter with elevated V rates at the time.  Echo revealed depressed EF.  She has made slow but steady improvements.  She continues to have fatigue and exertional shortness of breath at this time. Today, she denies symptoms of palpitations, chest pain, lower extremity edema, dizziness, presyncope, syncope, or further neurologic sequela.  The patient feels that she is tolerating medications without difficulties and is otherwise without complaint today.   Past Medical History  Diagnosis Date  . Asthma   . Hypertension   . Obesity   . Chronic diastolic congestive heart failure   . Carotid bruit   . Hypercholesterolemia   . Polyp of rectum   . Abnormal chest CT   . Persistent atrial fibrillation   . Bronchospasm 1998  . Atrial enlargement, left     severe  . Heart murmur   . Sleep apnea     DOES NOT HAVE CPAP  . Diabetes mellitus     insulin dependent  . Mitral regurgitation   . Tricuspid regurgitation 01/16/2012  . Rheumatic fever 01/16/2012    Reported during childhood  . Atrial fibrillation 01/05/2011    Chronic persistent, failed DCCV   . Obesity (BMI 30-39.9) 01/16/2012  . Shortness of breath     with exertion  . History of blood transfusion   . 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 S/P Maze operation for atrial fibrillation 02/15/2012    Complete biatrial lesion set using bipolar radiofrequency and cryothermy ablation with clipping of LA appendage  . Atrial flutter    Past Surgical History  Procedure  Laterality Date  . Cardiovascular stress test  09/2010  . Cardiac catheterization  >5 years  . Cardioversion  03/25/2011    Procedure: CARDIOVERSION;  Surgeon: Corky Crafts, MD;  Location: [facility] OR;  Service: Cardiovascular;  Laterality: N/A;  . Tee without cardioversion  12/21/2011    Procedure: TRANSESOPHAGEAL ECHOCARDIOGRAM (TEE);  Surgeon: Corky Crafts, MD;  Location: [facility] ENDOSCOPY;  Service: Cardiovascular;  Laterality: N/A;  . No past surgeries    . Tricuspid valve replacement  02/15/2012    Procedure: TRICUSPID VALVE REPAIR;  Surgeon: Purcell Nails, MD;  Location: [facility] OR;  Service: Open Heart Surgery;  Laterality: N/A;  . Maze  02/15/2012    Procedure: MAZE;  Surgeon: Purcell Nails, MD;  Location: [facility] OR;  Service: Open Heart Surgery;  Laterality: N/A;  . Intraoperative transesophageal echocardiogram  02/15/2012    Procedure: INTRAOPERATIVE TRANSESOPHAGEAL ECHOCARDIOGRAM;  Surgeon: Purcell Nails, MD;  Location: [facility] OR;  Service: Open Heart Surgery;  Laterality: N/A;  . Mitral valve replacement  02/15/2012    Procedure: MITRAL VALVE (MV) REPLACEMENT;  Surgeon: Purcell Nails, MD;  Location: [facility] OR;  Service: Open Heart Surgery;  Laterality: N/A;    Current Outpatient Prescriptions  Medication Sig Dispense Refill  . albuterol (PROVENTIL HFA;VENTOLIN HFA) 108 (90 BASE) MCG/ACT inhaler Inhale 2 puffs into the lungs every 6 (six) hours as needed. For wheezing      .  calcium carbonate (OS-CAL - DOSED IN MG OF ELEMENTAL CALCIUM) 1250 MG tablet Take 1 tablet by mouth daily.        . Cholecalciferol (VITAMIN D) 1000 UNITS capsule Take 1,000 Units by mouth daily.       [name] DIOVAN 320 MG tablet Take 320 mg by mouth daily.       . furosemide (LASIX) 40 MG tablet Take 1 tablet (40 mg total) by mouth 2 (two) times daily.  60 tablet  11  . insulin glargine (LANTUS SOLOSTAR) 100 UNIT/ML injection Inject 30-35 Units into the skin See admin instructions. Inject 30 units once daily and inject 35  units daily at bedtime.      [name] LIPITOR 40 MG tablet Take 40 mg by mouth every evening. Once daily      . metoprolol tartrate (LOPRESSOR) 25 MG tablet Take 1 tablet (25 mg total) by mouth 2 (two) times daily.  60 tablet  1  . Omega-3 Fatty Acids (FISH OIL) 1000 MG CAPS Take 1 capsule by mouth daily.       [name] oxyCODONE (OXY IR/ROXICODONE) 5 MG immediate release tablet Take 5-10 mg by mouth every 4 (four) hours as needed. For pain      . potassium chloride SA (K-DUR,KLOR-CON) 20 MEQ tablet Take 1 tablet (20 mEq total) by mouth daily.  30 tablet  11  . sitaGLIPtan-metformin (JANUMET) 50-1000 MG per tablet Take 1 tablet by mouth daily.      [name] warfarin (COUMADIN) 4 MG tablet Take 2 tablets (8 mg total) by mouth daily. On SATURDAYS AND SUNDAYS  10 tablet  1  . warfarin (COUMADIN) 6 MG tablet Take 1 tablet (6 mg total) by mouth daily. Take MONDAY TO Friday  30 tablet  0  . amiodarone (PACERONE) 200 MG tablet Take 1 tablet (200 mg total) by mouth 2 (two) times daily.  60 tablet  11  . [DISCONTINUED] flecainide (TAMBOCOR) 100 MG tablet Take 1 tablet (100 mg total) by mouth every 12 (twelve) hours.  60 tablet  11   No current facility-administered medications for this visit.    No Known Allergies  History   Social History  . Marital Status: Married    Spouse Name: N/A    Number of Children: N/A  . 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 Smoking status: Never Smoker   . Smokeless tobacco: Never Used  . Alcohol Use: No  . Drug Use: No  . Sexually Active:    Other Topics Concern  . Not on file   Social History Narrative   Pt lives in [place] with spouse.  Works at [employer]. 2 grown children, 1 grandchild    Family History  Problem Relation Age of Onset  . Asthma Mother     Physical Exam: Filed Vitals:   06/14/12 1115  BP: 134/88  Pulse: 82  Height: 5\' 7"  (1.702 m)  Weight: 218 lb  9.6 oz (99.156 kg)    GEN- The patient is well appearing, alert and oriented x 3 today.   Head- normocephalic, atraumatic Eyes-  Sclera clear, conjunctiva pink Ears- hearing intact Oropharynx- clear Neck- supple, no JVP Lymph- no cervical lymphadenopathy Lungs- Clear to ausculation bilaterally, normal work of breathing Heart- irregular rate and rhythm, mechanical S1 GI- soft, NT, ND, + BS Extremities- no clubbing, cyanosis, trace edema  ekg today reveals afib, LBBB, V rate 80  bpm Echo 05/28/12 ([facility])- EF 20-25%, mechanical MV, moderate RV dysfunction, moderately elevated RV pressure, restrictive physiology, LA 51mm  Assessment and Plan:   1. Afib/ atrial flutter Review of hospital records reveal that her V rates are at times elevated.  This may be the cause for her depressed EF.  She is s/p MAZE 2/14.  Her LA size is very large.   I think that at this time, we should again attempt to achieve sinus rhythm.  I will therefore start amiodarone 200mg  BID today.  Dr Eldridge Dace will arrange for cardioversion in 4 weeks.  If we are able to achieve sinus, then I would reassess her EF in 3 months. Continue long term anticoagulation with coumadin.  2. Nonischemic CM Possibly due to recent cardiac surgery as well as worsened by afib with RVR Rate control afib, pursue sinus  (as above) Continue to optimize medical therapy.   Reevaluate her EF after 90 days of further optimization of medical therapy and hopefully sinus rhythm.  IF her EF remains < 35% at that point, then she may be a candidate for BiV ICD.  3. LBBB As above

## 2012-06-23 ENCOUNTER — Other Ambulatory Visit: Payer: Self-pay | Admitting: Thoracic Surgery (Cardiothoracic Vascular Surgery)

## 2012-06-27 ENCOUNTER — Other Ambulatory Visit: Payer: Self-pay | Admitting: Thoracic Surgery (Cardiothoracic Vascular Surgery)

## 2012-07-02 ENCOUNTER — Ambulatory Visit: Payer: BC Managed Care – PPO | Admitting: Internal Medicine

## 2012-07-20 ENCOUNTER — Other Ambulatory Visit: Payer: Self-pay | Admitting: Interventional Cardiology

## 2012-07-27 ENCOUNTER — Ambulatory Visit (HOSPITAL_COMMUNITY)
Admission: RE | Admit: 2012-07-27 | Discharge: 2012-07-27 | Disposition: A | Discharge status: Home / Self Care | Payer: BC Managed Care – PPO | Source: Ambulatory Visit | Attending: Interventional Cardiology | Admitting: Interventional Cardiology

## 2012-07-27 ENCOUNTER — Encounter (HOSPITAL_COMMUNITY): Payer: Self-pay | Admitting: Anesthesiology

## 2012-07-27 ENCOUNTER — Encounter (HOSPITAL_COMMUNITY)
Admission: RE | Disposition: A | Discharge status: Home / Self Care | Payer: Self-pay | Source: Ambulatory Visit | Attending: Interventional Cardiology

## 2012-07-27 ENCOUNTER — Ambulatory Visit (HOSPITAL_COMMUNITY): Payer: BC Managed Care – PPO | Admitting: Anesthesiology

## 2012-07-27 DIAGNOSIS — I509 Heart failure, unspecified: Secondary | ICD-10-CM | POA: Insufficient documentation

## 2012-07-27 DIAGNOSIS — J45909 Unspecified asthma, uncomplicated: Secondary | ICD-10-CM | POA: Insufficient documentation

## 2012-07-27 DIAGNOSIS — G4730 Sleep apnea, unspecified: Secondary | ICD-10-CM | POA: Insufficient documentation

## 2012-07-27 DIAGNOSIS — I10 Essential (primary) hypertension: Secondary | ICD-10-CM | POA: Insufficient documentation

## 2012-07-27 DIAGNOSIS — I4891 Unspecified atrial fibrillation: Secondary | ICD-10-CM | POA: Insufficient documentation

## 2012-07-27 DIAGNOSIS — E119 Type 2 diabetes mellitus without complications: Secondary | ICD-10-CM | POA: Insufficient documentation

## 2012-07-27 DIAGNOSIS — R011 Cardiac murmur, unspecified: Secondary | ICD-10-CM | POA: Insufficient documentation

## 2012-07-27 DIAGNOSIS — D759 Disease of blood and blood-forming organs, unspecified: Secondary | ICD-10-CM | POA: Insufficient documentation

## 2012-07-27 DIAGNOSIS — I4892 Unspecified atrial flutter: Secondary | ICD-10-CM | POA: Insufficient documentation

## 2012-07-27 HISTORY — PX: CARDIOVERSION: SHX1299

## 2012-07-27 LAB — BASIC METABOLIC PANEL
BUN: 18 mg/dL (ref 6–23)
Chloride: 102 mEq/L (ref 96–112)
Glucose, Bld: 117 mg/dL — ABNORMAL HIGH (ref 70–99)
Potassium: 2.9 mEq/L — ABNORMAL LOW (ref 3.5–5.1)

## 2012-07-27 SURGERY — CARDIOVERSION
Anesthesia: General

## 2012-07-27 MED ORDER — PROPOFOL 10 MG/ML IV BOLUS
INTRAVENOUS | Status: DC | PRN
  Administered 2012-07-27: 55 mg via INTRAVENOUS

## 2012-07-27 MED ORDER — FUROSEMIDE 10 MG/ML IJ SOLN
40.0000 mg | Freq: Once | INTRAMUSCULAR | Status: AC
  Administered 2012-07-27: 40 mg via INTRAVENOUS

## 2012-07-27 MED ORDER — LIDOCAINE HCL (CARDIAC) 20 MG/ML IV SOLN
INTRAVENOUS | Status: DC | PRN
  Administered 2012-07-27: 30 mg via INTRAVENOUS

## 2012-07-27 NOTE — CV Procedure (Signed)
Electrical Cardioversion Procedure Note [name] [id number] 09-23-1955  Procedure: Electrical Cardioversion Indications:  Atrial Flutter  Time Out: Verified patient identification, verified procedure,medications/allergies/relevent history reviewed, required imaging and test results available.  Performed  Procedure Details  The patient was NPO after midnight. Anesthesia was administered at the beside  by Dr.Smith with 55 mg of propofol, 30 mg Lidocaine.  Cardioversion was done with synchronized biphasic defibrillation with AP pads with 120J.  The patient converted to normal sinus rhythm. The patient tolerated the procedure well   IMPRESSION:  Successful cardioversion to NSR from AFlutter  Continue anticoagulation indefinitely given MVR.    Faysal Fenoglio S. 07/27/2012, 12:09 PM

## 2012-07-27 NOTE — Anesthesia Postprocedure Evaluation (Signed)
  Anesthesia Post-op Note  Patient: [name]  Procedure(s) Performed: Procedure(s): CARDIOVERSION (N/A)  Patient Location: Endoscopy Unit  Anesthesia Type:General  Level of Consciousness: awake, alert , oriented and patient cooperative  Airway and Oxygen Therapy: Patient Spontanous Breathing and Patient connected to nasal cannula oxygen  Post-op Pain: none  Post-op Assessment: Post-op Vital signs reviewed, Patient's Cardiovascular Status Stable, Respiratory Function Stable, Patent Airway and No signs of Nausea or vomiting  Post-op Vital Signs: Reviewed and stable  Complications: No apparent anesthesia complications

## 2012-07-27 NOTE — Anesthesia Preprocedure Evaluation (Signed)
Anesthesia Evaluation  Patient identified by MRN, date of birth, ID band Patient awake    Reviewed: Allergy & Precautions, H&P , NPO status , Patient's Chart, lab work & pertinent test results  Airway Mallampati: II TM Distance: >3 FB Neck ROM: full    Dental   Pulmonary shortness of breath, asthma , sleep apnea ,          Cardiovascular hypertension, +CHF + dysrhythmias Atrial Fibrillation + Valvular Problems/Murmurs Rhythm:irregular Rate:Abnormal     Neuro/Psych    GI/Hepatic   Endo/Other  diabetes  Renal/GU      Musculoskeletal   Abdominal   Peds  Hematology  (+) Blood dyscrasia, ,   Anesthesia Other Findings   Reproductive/Obstetrics                           Anesthesia Physical Anesthesia Plan  ASA: III  Anesthesia Plan: General   Post-op Pain Management:    Induction: Intravenous  Airway Management Planned: Mask  Additional Equipment:   Intra-op Plan:   Post-operative Plan:   Informed Consent: I have reviewed the patients History and Physical, chart, labs and discussed the procedure including the risks, benefits and alternatives for the proposed anesthesia with the patient or authorized representative who has indicated his/her understanding and acceptance.     Plan Discussed with: CRNA, Anesthesiologist and Surgeon  Anesthesia Plan Comments:         Anesthesia Quick Evaluation

## 2012-07-27 NOTE — H&P (Signed)
Date of Initial H&P: 07/19/12  History reviewed, patient examined, no change in status, stable for surgery.

## 2012-07-27 NOTE — Transfer of Care (Signed)
Immediate Anesthesia Transfer of Care Note  Patient: [name]  Procedure(s) Performed: Procedure(s): CARDIOVERSION (N/A)  Patient Location: Endoscopy Unit  Anesthesia Type:General  Level of Consciousness: awake, alert , oriented and patient cooperative  Airway & Oxygen Therapy: Patient Spontanous Breathing and Patient connected to nasal cannula oxygen  Post-op Assessment: Report given to PACU RN, Post -op Vital signs reviewed and stable and Patient moving all extremities  Post vital signs: Reviewed and stable  Complications: No apparent anesthesia complications

## 2012-07-30 ENCOUNTER — Encounter (HOSPITAL_COMMUNITY): Payer: Self-pay | Admitting: Interventional Cardiology

## 2012-08-14 DIAGNOSIS — Z0279 Encounter for issue of other medical certificate: Secondary | ICD-10-CM

## 2012-08-27 ENCOUNTER — Encounter: Payer: Self-pay | Admitting: Thoracic Surgery (Cardiothoracic Vascular Surgery)

## 2012-08-27 ENCOUNTER — Ambulatory Visit (INDEPENDENT_AMBULATORY_CARE_PROVIDER_SITE_OTHER): Payer: BC Managed Care – PPO | Admitting: Thoracic Surgery (Cardiothoracic Vascular Surgery)

## 2012-08-27 VITALS — BP 140/86 | HR 66 | Resp 16 | Ht 66.0 in | Wt 206.0 lb

## 2012-08-27 DIAGNOSIS — Z9889 Other specified postprocedural states: Secondary | ICD-10-CM

## 2012-08-27 DIAGNOSIS — Z8679 Personal history of other diseases of the circulatory system: Secondary | ICD-10-CM

## 2012-08-27 DIAGNOSIS — I059 Rheumatic mitral valve disease, unspecified: Secondary | ICD-10-CM

## 2012-08-27 DIAGNOSIS — I071 Rheumatic tricuspid insufficiency: Secondary | ICD-10-CM

## 2012-08-27 DIAGNOSIS — I340 Nonrheumatic mitral (valve) insufficiency: Secondary | ICD-10-CM

## 2012-08-27 DIAGNOSIS — I4891 Unspecified atrial fibrillation: Secondary | ICD-10-CM

## 2012-08-27 DIAGNOSIS — Z954 Presence of other heart-valve replacement: Secondary | ICD-10-CM

## 2012-08-27 DIAGNOSIS — I079 Rheumatic tricuspid valve disease, unspecified: Secondary | ICD-10-CM

## 2012-08-27 DIAGNOSIS — Z952 Presence of prosthetic heart valve: Secondary | ICD-10-CM

## 2012-08-27 NOTE — Progress Notes (Signed)
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 She was last seen here in the office on 05/28/2012. Since then she underwent a followup echocardiogram at [facility] cardiology which revealed severe left ventricular dysfunction with ejection fraction estimated 20-25%. The mechanical valve in the mitral position was functioning normally. She was noted to have moderately decreased right ventricular systolic function was mild to moderate tricuspid regurgitation. She was in atrial fibrillation at that time. She was seen in followup by Dr.Varanasi and subsequently referred to Dr. Johney Frame. She was restarted on amiodarone and later underwent DC cardioversion by Dr. Eldridge Dace on 07/27/2012 at which time she was successfully cardioverted to normal sinus rhythm.  She returns to our office today for further followup and rhythm check. She reports stable symptoms of exertional shortness of breath.  She denies resting shortness of breath, PND, dizzy spells, or syncope.  She has not been having any tachypalpitations or dizzy spells. She has not had any problems with Coumadin therapy. She has not had any symptoms suggestive of TIA or recurrent stroke. She is back at work and overall reports no significant problems or complaints.    Current Outpatient Prescriptions  Medication Sig Dispense Refill  . albuterol (PROVENTIL HFA;VENTOLIN HFA) 108 (90 BASE) MCG/ACT inhaler Inhale 2 puffs into the lungs every 6 (six) hours as needed. For wheezing      . amiodarone (PACERONE) 200 MG tablet Take 1 tablet (200 mg total) by mouth 2 (two) times daily.  60 tablet  11  . calcium carbonate (OS-CAL - DOSED IN MG OF  ELEMENTAL CALCIUM) 1250 MG tablet Take 1 tablet by mouth daily.        . Cholecalciferol (VITAMIN D) 1000 UNITS capsule Take 1,000 Units by mouth daily.       [name] DIOVAN 320 MG tablet Take 320 mg by mouth daily.       . furosemide (LASIX) 40 MG tablet Take 1 tablet (40 mg total) by mouth 2 (two) times daily.  60 tablet  11  . insulin glargine (LANTUS SOLOSTAR) 100 UNIT/ML injection Inject 30-35 Units into the skin See admin instructions. Inject 30 units once daily and inject 35 units daily at bedtime.      [name] LIPITOR 40 MG tablet Take 40 mg by mouth every evening. Once daily      . metoprolol tartrate (LOPRESSOR) 25 MG tablet Take 1 tablet (25 mg total) by mouth 2 (two) times daily.  60 tablet  1  . Omega-3 Fatty Acids (FISH OIL) 1000 MG CAPS Take 1 capsule by mouth daily.       . potassium chloride SA (K-DUR,KLOR-CON) 20 MEQ tablet Take 1 tablet (20 mEq total) by mouth daily.  30 tablet  11  . sitaGLIPtan-metformin (JANUMET) 50-1000 MG per tablet Take 1 tablet by mouth daily.      [name] warfarin (COUMADIN) 4 MG tablet Take 2 tablets (8 mg total) by mouth daily. On SATURDAYS AND SUNDAYS  10 tablet  1  . [DISCONTINUED] flecainide (TAMBOCOR) 100 MG tablet Take 1 tablet (100 mg total) by mouth every 12 (twelve) hours.  60 tablet  11   No current facility-administered medications for this visit.      Physical Exam:   BP 140/86  Pulse 66  Resp 16  Ht 5\' 6"  (1.676 m)  Wt 206 lb (93.441 kg)  BMI 33.27 kg/m2  SpO2 96%  General:  Well-appearing  Chest:   Clear to auscultation  CV:   Regular rate and rhythm with mechanical heart sounds  Incisions:  Sternotomy scar has healed completely and may be starting to form a keloid, sternum is stable  Abdomen:  Soft and nontender  Extremities:  Warm and well-perfused with mild lower extremity edema  Diagnostic Tests:  2 channel telemetry rhythm strip demonstrates normal sinus rhythm   Impression:  The patient is now 6 months out status post mitral valve  replacement, tricuspid valve repair, and Maze procedure for 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  She is currently maintaining sinus rhythm since DC cardioversion last month. Followup echocardiogram performed last may revealed severe left ventricular systolic dysfunction with ejection fraction estimated 20-25%. At the time the patient was in atrial fibrillation.  Prior to mitral valve replacement her left ventricular ejection fraction was only mildly reduced, but this was in the setting of severe and long-standing mitral regurgitation.   Plan:  We have not made any recommendations for changes at this time and we'll defer long-term management of the patient's chronic congestive heart failure and atrial fibrillation to Dr. Eldridge Dace and Dr. Johney Frame.  The patient will return in 6 months for routine followup and rhythm check.   Salvatore Decent. Cornelius Moras, MD 08/27/2012 2:12 PM

## 2012-10-12 ENCOUNTER — Ambulatory Visit (INDEPENDENT_AMBULATORY_CARE_PROVIDER_SITE_OTHER): Payer: BC Managed Care – PPO | Admitting: Pharmacist

## 2012-10-12 DIAGNOSIS — I059 Rheumatic mitral valve disease, unspecified: Secondary | ICD-10-CM

## 2012-10-12 DIAGNOSIS — Z954 Presence of other heart-valve replacement: Secondary | ICD-10-CM

## 2012-10-14 ENCOUNTER — Other Ambulatory Visit: Payer: Self-pay | Admitting: Interventional Cardiology

## 2012-11-08 ENCOUNTER — Encounter: Payer: Self-pay | Admitting: Interventional Cardiology

## 2012-11-14 ENCOUNTER — Ambulatory Visit (INDEPENDENT_AMBULATORY_CARE_PROVIDER_SITE_OTHER): Payer: BC Managed Care – PPO | Admitting: Pharmacist

## 2012-11-14 DIAGNOSIS — I059 Rheumatic mitral valve disease, unspecified: Secondary | ICD-10-CM

## 2012-11-14 DIAGNOSIS — Z954 Presence of other heart-valve replacement: Secondary | ICD-10-CM

## 2012-11-14 LAB — POCT INR: INR: 2.8

## 2012-11-26 ENCOUNTER — Other Ambulatory Visit: Payer: Self-pay | Admitting: Internal Medicine

## 2012-12-11 ENCOUNTER — Ambulatory Visit (INDEPENDENT_AMBULATORY_CARE_PROVIDER_SITE_OTHER): Payer: BC Managed Care – PPO | Admitting: Pharmacist

## 2012-12-11 ENCOUNTER — Ambulatory Visit (INDEPENDENT_AMBULATORY_CARE_PROVIDER_SITE_OTHER): Payer: BC Managed Care – PPO | Admitting: Interventional Cardiology

## 2012-12-11 ENCOUNTER — Encounter: Payer: Self-pay | Admitting: Interventional Cardiology

## 2012-12-11 VITALS — BP 170/102 | HR 64 | Ht 68.0 in | Wt 204.0 lb

## 2012-12-11 DIAGNOSIS — Z954 Presence of other heart-valve replacement: Secondary | ICD-10-CM

## 2012-12-11 DIAGNOSIS — I059 Rheumatic mitral valve disease, unspecified: Secondary | ICD-10-CM

## 2012-12-11 DIAGNOSIS — I10 Essential (primary) hypertension: Secondary | ICD-10-CM

## 2012-12-11 DIAGNOSIS — I4892 Unspecified atrial flutter: Secondary | ICD-10-CM

## 2012-12-11 DIAGNOSIS — I340 Nonrheumatic mitral (valve) insufficiency: Secondary | ICD-10-CM

## 2012-12-11 DIAGNOSIS — Z952 Presence of prosthetic heart valve: Secondary | ICD-10-CM

## 2012-12-11 MED ORDER — VALSARTAN 320 MG PO TABS: 320.0000 mg | ORAL_TABLET | Freq: Every day | ORAL | Status: DC

## 2012-12-11 NOTE — Patient Instructions (Signed)
Your physician recommends that you schedule a follow-up appointment in: 3 months with Dr. Eldridge Dace.  Your physician has requested that you regularly monitor and record your blood pressure readings at home. Please use the same machine at the same time of day to check your readings and record them to bring to your follow-up visit. Call if BP stays consistently above 150/90.  Your physician recommends that you continue on your current medications as directed. Please refer to the Current Medication list given to you today.

## 2012-12-11 NOTE — Progress Notes (Signed)
Patient ID: [name], female   DOB: 09/14/1955, 57 y.o.   MRN: [id number]    [address] [place], [place]  [address] Phone: [phone] Fax:  [phone]  Date:  12/11/2012   ID:  [name], DOB 26-Dec-1955, MRN [id number]  PCP:  [name],[name], PA-C      History of Present Illness: [name] is a 57 y.o. female who had a mitral valve repair. She has had  atrial flutter. Dr. Johney Frame recommended amio followed by cardioversion. She had the cardioversion a few months ago in 07/2012. She is back to work. She is on regular duty. Atrial Fibrillation F/U:  avoids lifting some days. c/o Leg edema.  Denies : Chest pain.  Dizziness.  Orthopnea.  Palpitations.  Syncope.   DOE with walking stairs at work.  Wt Readings from Last 3 Encounters:  12/11/12 204 lb (92.534 kg)  08/27/12 206 lb (93.441 kg)  07/27/12 219 lb (99.338 kg)     Past Medical History  Diagnosis Date  . Asthma   . Hypertension   . Obesity   . Chronic diastolic congestive heart failure   . Carotid bruit   . Hypercholesterolemia   . Polyp of rectum   . Abnormal chest CT   . Persistent atrial fibrillation   . Bronchospasm 1998  . Atrial enlargement, left     severe  . Heart murmur   . Sleep apnea     DOES NOT HAVE CPAP  . Diabetes mellitus     insulin dependent  . Mitral regurgitation   . Tricuspid regurgitation 01/16/2012  . Rheumatic fever 01/16/2012    Reported during childhood  . Atrial fibrillation 01/05/2011    Chronic persistent, failed DCCV   . Obesity (BMI 30-39.9) 01/16/2012  . Shortness of breath     with exertion  . History of blood transfusion   . 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 S/P Maze operation for atrial fibrillation 02/15/2012    Complete biatrial lesion set using bipolar radiofrequency and cryothermy ablation  with clipping of LA appendage  . Atrial flutter     Current Outpatient Prescriptions  Medication Sig Dispense Refill  . albuterol (PROVENTIL HFA;VENTOLIN HFA) 108 (90 BASE) MCG/ACT inhaler Inhale 2 puffs into the lungs every 6 (six) hours as needed. For wheezing      . amiodarone (PACERONE) 200 MG tablet Take 1 tablet (200 mg total) by mouth 2 (two) times daily.  60 tablet  11  . atorvastatin (LIPITOR) 40 MG tablet TAKE 1 TABLET BY MOUTH EVERY DAY  30 tablet  5  . calcium carbonate (OS-CAL - DOSED IN MG OF ELEMENTAL CALCIUM) 1250 MG tablet Take 1 tablet by mouth daily.        . Cholecalciferol (VITAMIN D) 1000 UNITS capsule Take 1,000 Units by mouth daily.       [name] DIOVAN 320 MG tablet Take 320 mg by mouth daily.       . furosemide (LASIX) 40 MG tablet Take 1 tablet (40 mg total) by mouth 2 (two) times daily.  60 tablet  11  . insulin glargine (LANTUS SOLOSTAR) 100 UNIT/ML injection Inject 30-35 Units into the skin See admin instructions. Inject 30 units once daily and inject 35 units daily at bedtime.      . metFORMIN (GLUCOPHAGE)  500 MG tablet Take 500 mg by mouth 2 (two) times daily with a meal.       . metoprolol tartrate (LOPRESSOR) 25 MG tablet Take 1 tablet (25 mg total) by mouth 2 (two) times daily.  60 tablet  1  . Omega-3 Fatty Acids (FISH OIL) 1000 MG CAPS Take 1 capsule by mouth daily.       . ONGLYZA 5 MG TABS tablet Take 5 mg by mouth daily.       . potassium chloride SA (K-DUR,KLOR-CON) 20 MEQ tablet Take 1 tablet (20 mEq total) by mouth daily.  30 tablet  11  . warfarin (COUMADIN) 4 MG tablet Take 2 tablets (8 mg total) by mouth daily. On SATURDAYS AND SUNDAYS  10 tablet  1  . [DISCONTINUED] flecainide (TAMBOCOR) 100 MG tablet Take 1 tablet (100 mg total) by mouth every 12 (twelve) hours.  60 tablet  11   No current facility-administered medications for this visit.    Allergies:   No Known Allergies  Social History:  The patient  reports that she has never smoked. She has  never used smokeless tobacco. She reports that she does not drink alcohol or use illicit drugs.   Family History:  The patient's family history includes Asthma in her mother.   ROS:  Please see the history of present illness.  No nausea, vomiting.  No fevers, chills.  No focal weakness.  No dysuria. Fatigue.     All other systems reviewed and negative.   PHYSICAL EXAM: VS:  BP 170/102  Pulse 64  Ht 5\' 8"  (1.727 m)  Wt 204 lb (92.534 kg)  BMI 31.03 kg/m2 Well nourished, well developed, in no acute distress HEENT: normal Neck: no JVD, no carotid bruits Cardiac:  crisp S1 click,   Normal S2; RRR;  Lungs:  clear to auscultation bilaterally, no wheezing, rhonchi or rales Abd: soft, nontender, no hepatomegaly Ext: mild ankle  edemabilaterally Skin: warm and dry Neuro:   no focal abnormalities noted      ASSESSMENT AND PLAN:  H/O mitral valve replacement  Notes: needs SBE prophylaxis.  2. 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  3. SOB  Continue Lasix Tablet, 40 MG, 1 tablet, Orally, BID Notes: Some fluid overload. Check BP at home.  4. Atrial flutter  Notes: Sucessful cardioversion. Coumadin for stroke prevention.   5. HTN: Check BP outside of the doctor's office.  If systolic above 150, let us know.  She did not take her Diovan today.  If this is expensive, we can switch her to a generic.     Signed, Fredric Mare, MD, [facility] 12/11/2012 2:14 PM

## 2012-12-21 ENCOUNTER — Ambulatory Visit (INDEPENDENT_AMBULATORY_CARE_PROVIDER_SITE_OTHER): Payer: BC Managed Care – PPO | Admitting: General Practice

## 2012-12-21 DIAGNOSIS — Z954 Presence of other heart-valve replacement: Secondary | ICD-10-CM

## 2012-12-21 DIAGNOSIS — I059 Rheumatic mitral valve disease, unspecified: Secondary | ICD-10-CM

## 2013-01-07 ENCOUNTER — Ambulatory Visit (INDEPENDENT_AMBULATORY_CARE_PROVIDER_SITE_OTHER): Payer: BC Managed Care – PPO | Admitting: Pharmacist

## 2013-01-07 DIAGNOSIS — Z954 Presence of other heart-valve replacement: Secondary | ICD-10-CM

## 2013-01-07 DIAGNOSIS — I059 Rheumatic mitral valve disease, unspecified: Secondary | ICD-10-CM

## 2013-01-07 LAB — POCT INR: INR: 2

## 2013-01-17 NOTE — Progress Notes (Signed)
This encounter was created in error - please disregard.

## 2013-01-28 ENCOUNTER — Ambulatory Visit (INDEPENDENT_AMBULATORY_CARE_PROVIDER_SITE_OTHER): Payer: BC Managed Care – PPO | Admitting: Pharmacist

## 2013-01-28 DIAGNOSIS — Z954 Presence of other heart-valve replacement: Secondary | ICD-10-CM

## 2013-01-28 DIAGNOSIS — I059 Rheumatic mitral valve disease, unspecified: Secondary | ICD-10-CM

## 2013-01-28 LAB — POCT INR: INR: 2.5

## 2013-02-12 ENCOUNTER — Other Ambulatory Visit: Payer: Self-pay | Admitting: Interventional Cardiology

## 2013-02-18 ENCOUNTER — Ambulatory Visit (INDEPENDENT_AMBULATORY_CARE_PROVIDER_SITE_OTHER): Payer: BC Managed Care – PPO | Admitting: Pharmacist

## 2013-02-18 ENCOUNTER — Telehealth: Payer: Self-pay | Admitting: Pharmacist

## 2013-02-18 DIAGNOSIS — I059 Rheumatic mitral valve disease, unspecified: Secondary | ICD-10-CM

## 2013-02-18 DIAGNOSIS — Z954 Presence of other heart-valve replacement: Secondary | ICD-10-CM

## 2013-02-18 LAB — POCT INR: INR: 2.7

## 2013-02-18 MED ORDER — FUROSEMIDE 40 MG PO TABS: 40.0000 mg | ORAL_TABLET | Freq: Two times a day (BID) | ORAL | Status: DC

## 2013-02-18 NOTE — Telephone Encounter (Signed)
Patient states she just ran out of lasix 3 days ago and is out of refills.  She is starting to notice some fluid building in her feet.  She is due to see Dr. Irish Lack next month, so I will go ahead and refill lasix 40 mg bid today and then Dr. Irish Lack can assess if this is still appropriate at their f/u visit next month.   Prescription sent. To Dr. Irish Lack as FYI only.

## 2013-02-25 ENCOUNTER — Encounter: Payer: Self-pay | Admitting: Thoracic Surgery (Cardiothoracic Vascular Surgery)

## 2013-02-25 ENCOUNTER — Ambulatory Visit (INDEPENDENT_AMBULATORY_CARE_PROVIDER_SITE_OTHER): Payer: BC Managed Care – PPO | Admitting: Thoracic Surgery (Cardiothoracic Vascular Surgery)

## 2013-02-25 VITALS — BP 179/100 | HR 80 | Resp 20 | Ht 68.0 in | Wt 204.0 lb

## 2013-02-25 DIAGNOSIS — Z9889 Other specified postprocedural states: Secondary | ICD-10-CM

## 2013-02-25 DIAGNOSIS — Z8679 Personal history of other diseases of the circulatory system: Secondary | ICD-10-CM

## 2013-02-25 DIAGNOSIS — I059 Rheumatic mitral valve disease, unspecified: Secondary | ICD-10-CM

## 2013-02-25 DIAGNOSIS — Z954 Presence of other heart-valve replacement: Secondary | ICD-10-CM

## 2013-02-25 DIAGNOSIS — I340 Nonrheumatic mitral (valve) insufficiency: Secondary | ICD-10-CM

## 2013-02-25 DIAGNOSIS — I071 Rheumatic tricuspid insufficiency: Secondary | ICD-10-CM

## 2013-02-25 DIAGNOSIS — I079 Rheumatic tricuspid valve disease, unspecified: Secondary | ICD-10-CM

## 2013-02-25 NOTE — Progress Notes (Signed)
[address]       [place] [address]             [phone]     CARDIOTHORACIC SURGERY OFFICE NOTE  Referring Provider is Casandra Doffing, MD PCP is [name],[name], PA-C   HPI:  Patient returns for followup status post mitral valve replacement using a mechanical prosthesis, tricuspid valve repair, and Maze procedure on 02/15/2012 for rheumatic mitral valve disease. She was last seen here in the office on 08/17/2012.  Since then she has remained clinically stable. She continues to maintain sinus rhythm since cardioversion last July. She has stable symptoms of exertional shortness of breath, functional class II to class III. These are notably much better than they were prior to surgery last February. She has not had any tachypalpitations or dizzy spells. She has chronic bilateral lower extremity edema. She has not had any problems or complications with long-term Coumadin therapy.  She has not had a followup echocardiogram since May of 2014, and at that time left ventricular systolic function appeared reduced early after mitral valve replacement, although the patient was in atrial fibrillation at that time.    Current Outpatient Prescriptions  Medication Sig Dispense Refill  . albuterol (PROVENTIL HFA;VENTOLIN HFA) 108 (90 BASE) MCG/ACT inhaler Inhale 2 puffs into the lungs every 6 (six) hours as needed. For wheezing      . amiodarone (PACERONE) 200 MG tablet Take 1 tablet (200 mg total) by mouth 2 (two) times daily.  60 tablet  11  . atorvastatin (LIPITOR) 40 MG tablet TAKE 1 TABLET BY MOUTH EVERY DAY  30 tablet  5  . calcium carbonate (OS-CAL - DOSED IN MG OF ELEMENTAL CALCIUM) 1250 MG tablet Take 1 tablet by mouth daily.        . Cholecalciferol (VITAMIN D) 1000 UNITS capsule Take 1,000 Units by mouth daily.       . furosemide (LASIX) 40 MG tablet Take 1 tablet (40 mg total) by mouth 2 (two) times daily.  180 tablet  1  . insulin glargine (LANTUS SOLOSTAR) 100  UNIT/ML injection Inject 30-35 Units into the skin See admin instructions. Inject 30 units once daily and inject 35 units daily at bedtime.      . metFORMIN (GLUCOPHAGE) 500 MG tablet Take 500 mg by mouth 2 (two) times daily with a meal.       . metoprolol tartrate (LOPRESSOR) 25 MG tablet Take 1 tablet (25 mg total) by mouth 2 (two) times daily.  60 tablet  1  . Omega-3 Fatty Acids (FISH OIL) 1000 MG CAPS Take 1 capsule by mouth daily.       . ONGLYZA 5 MG TABS tablet Take 5 mg by mouth daily.       . potassium chloride SA (K-DUR,KLOR-CON) 20 MEQ tablet Take 1 tablet (20 mEq total) by mouth daily.  30 tablet  11  . valsartan (DIOVAN) 320 MG tablet Take 1 tablet (320 mg total) by mouth daily.  30 tablet  11  . warfarin (COUMADIN) 4 MG tablet Take as directed by coumadin clinic  45 tablet  2  . [DISCONTINUED] flecainide (TAMBOCOR) 100 MG tablet Take 1 tablet (100 mg total) by mouth every 12 (twelve) hours.  60 tablet  11   No current facility-administered medications for this visit.      Physical Exam:   BP 179/100  Pulse 80  Resp 20  Ht 5\' 8"  (1.727 m)  Wt 204 lb (92.534  kg)  BMI 31.03 kg/m2  SpO2 97%  General:  Well-appearing  Chest:   Clear to auscultation  CV:   Regular rate and rhythm with mechanical heart sounds  Incisions:  Completely healed, sternum is stable  Abdomen:  Soft nontender  Extremities:  Warm and well-perfused with moderate bilateral lower extremity edema  Diagnostic Tests:  2 channel telemetry rhythm strip demonstrates normal sinus rhythm   Impression:  Patient is clinically doing well 1 year status post mitral valve replacement using a mechanical prosthesis with tricuspid valve repair and Maze procedure for rheumatic mitral valve disease with chronic persistent atrial fibrillation.  She has stable symptoms of chronic combined systolic and diastolic congestive heart failure, functional class II to class III. Followup echocardiogram performed early after mitral  valve replacement revealed a drop in ejection fraction, although the patient was in atrial fibrillation at that time. 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    Plan:  The patient will continue to followup with Dr. Irish Lack whom she is scheduled to see next month. We will have her return in one year to see she is getting along.  We will leave it up to his discretion whether or not to consider stopping amiodarone at some point in the future.   Valentina Gu. [name], MD 02/25/2013 12:49 PM

## 2013-02-25 NOTE — Patient Instructions (Signed)

## 2013-03-11 ENCOUNTER — Ambulatory Visit (INDEPENDENT_AMBULATORY_CARE_PROVIDER_SITE_OTHER): Payer: BC Managed Care – PPO

## 2013-03-11 ENCOUNTER — Ambulatory Visit: Payer: BC Managed Care – PPO | Admitting: Interventional Cardiology

## 2013-03-11 DIAGNOSIS — I059 Rheumatic mitral valve disease, unspecified: Secondary | ICD-10-CM

## 2013-03-11 DIAGNOSIS — Z954 Presence of other heart-valve replacement: Secondary | ICD-10-CM

## 2013-03-11 LAB — POCT INR: INR: 5.5

## 2013-03-19 ENCOUNTER — Ambulatory Visit (INDEPENDENT_AMBULATORY_CARE_PROVIDER_SITE_OTHER): Payer: BC Managed Care – PPO

## 2013-03-19 DIAGNOSIS — I059 Rheumatic mitral valve disease, unspecified: Secondary | ICD-10-CM

## 2013-03-19 DIAGNOSIS — Z954 Presence of other heart-valve replacement: Secondary | ICD-10-CM

## 2013-03-19 DIAGNOSIS — Z5181 Encounter for therapeutic drug level monitoring: Secondary | ICD-10-CM | POA: Insufficient documentation

## 2013-03-19 LAB — POCT INR: INR: 2

## 2013-04-01 ENCOUNTER — Ambulatory Visit (INDEPENDENT_AMBULATORY_CARE_PROVIDER_SITE_OTHER): Payer: BC Managed Care – PPO | Admitting: Pharmacist

## 2013-04-01 DIAGNOSIS — Z5181 Encounter for therapeutic drug level monitoring: Secondary | ICD-10-CM

## 2013-04-01 DIAGNOSIS — I059 Rheumatic mitral valve disease, unspecified: Secondary | ICD-10-CM

## 2013-04-01 DIAGNOSIS — Z954 Presence of other heart-valve replacement: Secondary | ICD-10-CM

## 2013-04-01 LAB — POCT INR: INR: 2.4

## 2013-04-25 ENCOUNTER — Encounter: Payer: Self-pay | Admitting: Interventional Cardiology

## 2013-04-25 ENCOUNTER — Encounter (INDEPENDENT_AMBULATORY_CARE_PROVIDER_SITE_OTHER): Payer: Self-pay

## 2013-04-25 ENCOUNTER — Ambulatory Visit (INDEPENDENT_AMBULATORY_CARE_PROVIDER_SITE_OTHER): Payer: BC Managed Care – PPO | Admitting: *Deleted

## 2013-04-25 ENCOUNTER — Ambulatory Visit (INDEPENDENT_AMBULATORY_CARE_PROVIDER_SITE_OTHER): Payer: BC Managed Care – PPO | Admitting: Interventional Cardiology

## 2013-04-25 VITALS — BP 172/100 | HR 98 | Ht 68.0 in | Wt 203.0 lb

## 2013-04-25 DIAGNOSIS — I519 Heart disease, unspecified: Secondary | ICD-10-CM

## 2013-04-25 DIAGNOSIS — I059 Rheumatic mitral valve disease, unspecified: Secondary | ICD-10-CM

## 2013-04-25 DIAGNOSIS — Z954 Presence of other heart-valve replacement: Secondary | ICD-10-CM

## 2013-04-25 DIAGNOSIS — I4892 Unspecified atrial flutter: Secondary | ICD-10-CM

## 2013-04-25 DIAGNOSIS — Z5181 Encounter for therapeutic drug level monitoring: Secondary | ICD-10-CM

## 2013-04-25 DIAGNOSIS — Z79899 Other long term (current) drug therapy: Secondary | ICD-10-CM

## 2013-04-25 DIAGNOSIS — I10 Essential (primary) hypertension: Secondary | ICD-10-CM

## 2013-04-25 LAB — POCT INR: INR: 2.2

## 2013-04-25 MED ORDER — AMLODIPINE BESYLATE 5 MG PO TABS: 5.0000 mg | ORAL_TABLET | Freq: Every day | ORAL | Status: DC

## 2013-04-25 MED ORDER — IRBESARTAN 300 MG PO TABS: 300.0000 mg | ORAL_TABLET | Freq: Every day | ORAL | Status: DC

## 2013-04-25 NOTE — Progress Notes (Signed)
Patient ID: [name], female   DOB: Feb 09, 1955, 58 y.o.   MRN: [id number] Patient ID: [name], female   DOB: 11-Feb-1955, 58 y.o.   MRN: [id number]    [name], [name] Phone: [phone] Fax:  [phone]  Date:  04/25/2013   ID:  [name], DOB Jun 16, 1955, MRN [id number]  PCP:  [name],[name], PA-C      History of Present Illness: [name] is a 58 y.o. female who had a mitral valve repair. She has had  atrial flutter. Dr. Rayann Heman recommended amio followed by cardioversion. She had the cardioversion a few months ago in 07/2012. She is back to work. She is on regular duty.  She has some trouble going up stairs.  Atrial Fibrillation F/U:  avoids lifting some days. c/o Leg edema.  Denies : Chest pain.  Dizziness.  Orthopnea.  Palpitations.  Syncope.   DOE with walking stairs at work.  Wt Readings from Last 3 Encounters:  04/25/13 203 lb (92.08 kg)  02/25/13 204 lb (92.534 kg)  12/11/12 204 lb (92.534 kg)     Past Medical History  Diagnosis Date  . Asthma   . Hypertension   . Obesity   . Chronic diastolic congestive heart failure   . Carotid bruit   . Hypercholesterolemia   . Polyp of rectum   . Abnormal chest CT   . Persistent atrial fibrillation   . Bronchospasm 1998  . Atrial enlargement, left     severe  . Heart murmur   . Sleep apnea     DOES NOT HAVE CPAP  . Diabetes mellitus     insulin dependent  . Mitral regurgitation   . Tricuspid regurgitation 01/16/2012  . Rheumatic fever 01/16/2012    Reported during childhood  . Atrial fibrillation 01/05/2011    Chronic persistent, failed DCCV   . Obesity (BMI 30-39.9) 01/16/2012  . Shortness of breath     with exertion  . History of blood transfusion   . S/P mitral valve replacement with metallic valve 0/09/3816    50mm Sorin Carbomedics Optiform mechanical prosthesis  . 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    52mm Edwards mc3 ring annuloplasty  . S/P Maze operation for  atrial fibrillation 02/15/2012    Complete biatrial lesion set using bipolar radiofrequency and cryothermy ablation with clipping of LA appendage  . Atrial flutter     Current Outpatient Prescriptions  Medication Sig Dispense Refill  . albuterol (PROVENTIL HFA;VENTOLIN HFA) 108 (90 BASE) MCG/ACT inhaler Inhale 2 puffs into the lungs every 6 (six) hours as needed. For wheezing      . amiodarone (PACERONE) 200 MG tablet Take 1 tablet (200 mg total) by mouth 2 (two) times daily.  60 tablet  11  . atorvastatin (LIPITOR) 40 MG tablet TAKE 1 TABLET BY MOUTH EVERY DAY  30 tablet  5  . calcium carbonate (OS-CAL - DOSED IN MG OF ELEMENTAL CALCIUM) 1250 MG tablet Take 1 tablet by mouth daily.        . Cholecalciferol (VITAMIN D) 1000 UNITS capsule Take 1,000 Units by mouth daily.       . furosemide (LASIX) 40 MG tablet Take 1 tablet (40 mg total) by mouth 2 (two) times daily.  180 tablet  1  . insulin glargine (LANTUS SOLOSTAR) 100 UNIT/ML injection Inject 30-35 Units into the skin See admin instructions. Inject 30 units once daily and inject 35 units daily at bedtime.      [name]  metFORMIN (GLUCOPHAGE) 500 MG tablet Take 500 mg by mouth 2 (two) times daily with a meal.       . metoprolol tartrate (LOPRESSOR) 25 MG tablet Take 1 tablet (25 mg total) by mouth 2 (two) times daily.  60 tablet  1  . Omega-3 Fatty Acids (FISH OIL) 1000 MG CAPS Take 1 capsule by mouth daily.       . ONGLYZA 5 MG TABS tablet Take 5 mg by mouth daily.       . potassium chloride SA (K-DUR,KLOR-CON) 20 MEQ tablet Take 1 tablet (20 mEq total) by mouth daily.  30 tablet  11  . valsartan (DIOVAN) 320 MG tablet Take 1 tablet (320 mg total) by mouth daily.  30 tablet  11  . warfarin (COUMADIN) 4 MG tablet Take as directed by coumadin clinic  45 tablet  2  . [DISCONTINUED] flecainide (TAMBOCOR) 100 MG tablet Take 1 tablet (100 mg total) by mouth every 12 (twelve) hours.  60 tablet  11   No current facility-administered medications for this  visit.    Allergies:   No Known Allergies  Social History:  The patient  reports that she has never smoked. She has never used smokeless tobacco. She reports that she does not drink alcohol or use illicit drugs.   Family History:  The patient's family history includes Asthma in her mother.   ROS:  Please see the history of present illness.  No nausea, vomiting.  No fevers, chills.  No focal weakness.  No dysuria. Fatigue.     All other systems reviewed and negative.   PHYSICAL EXAM: VS:  BP 172/100  Pulse 98  Ht 5\' 8"  (1.727 m)  Wt 203 lb (92.08 kg)  BMI 30.87 kg/m2 Well nourished, well developed, in no acute distress HEENT: normal Neck: no JVD, no carotid bruits Cardiac:  crisp S1 click,   Normal S2; RRR;  Lungs:  clear to auscultation bilaterally, no wheezing, rhonchi or rales Abd: soft, nontender, no hepatomegaly Ext: mild ankle  edemabilaterally Skin: warm and dry Neuro:   no focal abnormalities noted      ASSESSMENT AND PLAN:  H/O mitral valve replacement  Notes: needs SBE prophylaxis.  2. 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  3. SOB  Continue Lasix Tablet, 40 MG, 1 tablet, Orally, BID Notes: Some fluid overload. Check BP at home. Known LV systolic dysfunction.  Stressed importance of BP control to help forward blood flow.   4. Atrial flutter  Notes: Sucessful cardioversion. Coumadin for stroke prevention. Amiodarone to maintain NSR.  5. HTN: Check BP outside of the doctor's office.  If systolic above 892, let us know.  She did not take her Diovan today.  It is expensive, we can switch her to a generic.  Change to Irbesartan 300 mg daily.  Start amlodipine 5 mg daily. Stressed importance of taking her meds.     Signed, Mina Marble, MD, [facility] 04/25/2013 12:27 PM

## 2013-04-25 NOTE — Patient Instructions (Signed)
Your physician has recommended you make the following change in your medication:   1. Stop Diovan.  2. Start Irbesartan 300 mg 1 tablet daily.   3. Start Amlodipine 5 mg 1 tablet daily.   Your physician recommends that you return for lab work in 1 weeks on 05/02/13.   Your physician has requested that you regularly monitor and record your blood pressure readings at home. Please use the same machine at the same time of day to check your readings and record them to bring to your follow-up visit. Call with BP readings in 10-14 days.   Your physician wants you to follow-up in: 6 months with Dr. Irish Lack. You will receive a reminder letter in the mail two months in advance. If you don't receive a letter, please call our office to schedule the follow-up appointment.

## 2013-05-02 ENCOUNTER — Other Ambulatory Visit (INDEPENDENT_AMBULATORY_CARE_PROVIDER_SITE_OTHER): Payer: BC Managed Care – PPO

## 2013-05-02 DIAGNOSIS — Z79899 Other long term (current) drug therapy: Secondary | ICD-10-CM

## 2013-05-02 LAB — BASIC METABOLIC PANEL
BUN: 16 mg/dL (ref 6–23)
CALCIUM: 9.1 mg/dL (ref 8.4–10.5)
CHLORIDE: 100 meq/L (ref 96–112)
CO2: 26 meq/L (ref 19–32)
Creatinine, Ser: 0.8 mg/dL (ref 0.4–1.2)
GFR: 90.63 mL/min (ref >60.00)
Glucose, Bld: 81 mg/dL (ref 70–99)
Potassium: 3.5 mEq/L (ref 3.5–5.1)
SODIUM: 136 meq/L (ref 135–145)

## 2013-05-09 ENCOUNTER — Ambulatory Visit (INDEPENDENT_AMBULATORY_CARE_PROVIDER_SITE_OTHER): Payer: BC Managed Care – PPO

## 2013-05-09 DIAGNOSIS — Z5181 Encounter for therapeutic drug level monitoring: Secondary | ICD-10-CM

## 2013-05-09 DIAGNOSIS — I059 Rheumatic mitral valve disease, unspecified: Secondary | ICD-10-CM

## 2013-05-09 DIAGNOSIS — Z954 Presence of other heart-valve replacement: Secondary | ICD-10-CM

## 2013-05-09 LAB — POCT INR: INR: 1.8

## 2013-05-17 ENCOUNTER — Other Ambulatory Visit: Payer: Self-pay | Admitting: Interventional Cardiology

## 2013-05-22 ENCOUNTER — Ambulatory Visit: Payer: BC Managed Care – PPO | Attending: Physician Assistant

## 2013-05-22 DIAGNOSIS — R279 Unspecified lack of coordination: Secondary | ICD-10-CM | POA: Insufficient documentation

## 2013-05-22 DIAGNOSIS — I69998 Other sequelae following unspecified cerebrovascular disease: Secondary | ICD-10-CM | POA: Insufficient documentation

## 2013-05-22 DIAGNOSIS — Z5189 Encounter for other specified aftercare: Secondary | ICD-10-CM | POA: Insufficient documentation

## 2013-05-22 DIAGNOSIS — M6281 Muscle weakness (generalized): Secondary | ICD-10-CM | POA: Insufficient documentation

## 2013-05-22 DIAGNOSIS — R5381 Other malaise: Secondary | ICD-10-CM | POA: Diagnosis not present

## 2013-05-23 ENCOUNTER — Ambulatory Visit (INDEPENDENT_AMBULATORY_CARE_PROVIDER_SITE_OTHER): Payer: BC Managed Care – PPO | Admitting: Pharmacist

## 2013-05-23 DIAGNOSIS — Z954 Presence of other heart-valve replacement: Secondary | ICD-10-CM

## 2013-05-23 DIAGNOSIS — Z5181 Encounter for therapeutic drug level monitoring: Secondary | ICD-10-CM

## 2013-05-23 DIAGNOSIS — I059 Rheumatic mitral valve disease, unspecified: Secondary | ICD-10-CM

## 2013-05-23 LAB — POCT INR: INR: 1.7

## 2013-05-23 MED ORDER — WARFARIN SODIUM 4 MG PO TABS: ORAL_TABLET | ORAL | Status: DC

## 2013-05-27 ENCOUNTER — Ambulatory Visit: Payer: BC Managed Care – PPO | Admitting: Occupational Therapy

## 2013-05-27 DIAGNOSIS — Z5189 Encounter for other specified aftercare: Secondary | ICD-10-CM | POA: Diagnosis not present

## 2013-05-28 ENCOUNTER — Ambulatory Visit: Payer: BC Managed Care – PPO

## 2013-05-28 DIAGNOSIS — Z5189 Encounter for other specified aftercare: Secondary | ICD-10-CM | POA: Diagnosis not present

## 2013-05-29 ENCOUNTER — Ambulatory Visit: Payer: BC Managed Care – PPO

## 2013-05-29 DIAGNOSIS — Z5189 Encounter for other specified aftercare: Secondary | ICD-10-CM | POA: Diagnosis not present

## 2013-06-04 ENCOUNTER — Ambulatory Visit: Payer: BC Managed Care – PPO | Admitting: Occupational Therapy

## 2013-06-04 DIAGNOSIS — Z5189 Encounter for other specified aftercare: Secondary | ICD-10-CM | POA: Diagnosis not present

## 2013-06-05 ENCOUNTER — Ambulatory Visit: Payer: BC Managed Care – PPO

## 2013-06-05 DIAGNOSIS — Z5189 Encounter for other specified aftercare: Secondary | ICD-10-CM | POA: Diagnosis not present

## 2013-06-06 ENCOUNTER — Ambulatory Visit (INDEPENDENT_AMBULATORY_CARE_PROVIDER_SITE_OTHER): Payer: BC Managed Care – PPO | Admitting: *Deleted

## 2013-06-06 DIAGNOSIS — I059 Rheumatic mitral valve disease, unspecified: Secondary | ICD-10-CM

## 2013-06-06 DIAGNOSIS — Z954 Presence of other heart-valve replacement: Secondary | ICD-10-CM

## 2013-06-06 DIAGNOSIS — Z5181 Encounter for therapeutic drug level monitoring: Secondary | ICD-10-CM

## 2013-06-06 LAB — POCT INR: INR: 2.3

## 2013-06-07 ENCOUNTER — Ambulatory Visit: Payer: BC Managed Care – PPO

## 2013-06-07 DIAGNOSIS — Z5189 Encounter for other specified aftercare: Secondary | ICD-10-CM | POA: Diagnosis not present

## 2013-06-10 ENCOUNTER — Ambulatory Visit: Payer: BC Managed Care – PPO | Attending: Physician Assistant | Admitting: Occupational Therapy

## 2013-06-10 ENCOUNTER — Ambulatory Visit: Payer: BC Managed Care – PPO

## 2013-06-10 DIAGNOSIS — Z5189 Encounter for other specified aftercare: Secondary | ICD-10-CM | POA: Diagnosis present

## 2013-06-10 DIAGNOSIS — R5381 Other malaise: Secondary | ICD-10-CM | POA: Diagnosis not present

## 2013-06-10 DIAGNOSIS — R279 Unspecified lack of coordination: Secondary | ICD-10-CM | POA: Insufficient documentation

## 2013-06-10 DIAGNOSIS — M6281 Muscle weakness (generalized): Secondary | ICD-10-CM | POA: Insufficient documentation

## 2013-06-10 DIAGNOSIS — I69998 Other sequelae following unspecified cerebrovascular disease: Secondary | ICD-10-CM | POA: Diagnosis not present

## 2013-06-13 ENCOUNTER — Ambulatory Visit: Payer: BC Managed Care – PPO | Admitting: Occupational Therapy

## 2013-06-13 ENCOUNTER — Ambulatory Visit: Payer: BC Managed Care – PPO | Admitting: Physical Therapy

## 2013-06-13 DIAGNOSIS — Z5189 Encounter for other specified aftercare: Secondary | ICD-10-CM | POA: Diagnosis not present

## 2013-06-17 ENCOUNTER — Ambulatory Visit: Payer: BC Managed Care – PPO | Admitting: Physical Therapy

## 2013-06-17 ENCOUNTER — Ambulatory Visit: Payer: BC Managed Care – PPO | Admitting: Occupational Therapy

## 2013-06-17 DIAGNOSIS — Z5189 Encounter for other specified aftercare: Secondary | ICD-10-CM | POA: Diagnosis not present

## 2013-06-20 ENCOUNTER — Ambulatory Visit (INDEPENDENT_AMBULATORY_CARE_PROVIDER_SITE_OTHER): Payer: BC Managed Care – PPO | Admitting: *Deleted

## 2013-06-20 ENCOUNTER — Ambulatory Visit: Payer: BC Managed Care – PPO | Admitting: Occupational Therapy

## 2013-06-20 ENCOUNTER — Ambulatory Visit: Payer: BC Managed Care – PPO | Admitting: Physical Therapy

## 2013-06-20 DIAGNOSIS — Z5189 Encounter for other specified aftercare: Secondary | ICD-10-CM | POA: Diagnosis not present

## 2013-06-20 DIAGNOSIS — Z5181 Encounter for therapeutic drug level monitoring: Secondary | ICD-10-CM

## 2013-06-20 DIAGNOSIS — Z954 Presence of other heart-valve replacement: Secondary | ICD-10-CM

## 2013-06-20 DIAGNOSIS — I059 Rheumatic mitral valve disease, unspecified: Secondary | ICD-10-CM

## 2013-06-20 LAB — POCT INR: INR: 1.7

## 2013-06-24 ENCOUNTER — Ambulatory Visit: Payer: BC Managed Care – PPO | Admitting: Occupational Therapy

## 2013-06-24 ENCOUNTER — Ambulatory Visit: Payer: BC Managed Care – PPO | Admitting: Physical Therapy

## 2013-06-24 DIAGNOSIS — Z5189 Encounter for other specified aftercare: Secondary | ICD-10-CM | POA: Diagnosis not present

## 2013-06-26 ENCOUNTER — Ambulatory Visit: Payer: BC Managed Care – PPO | Admitting: Physical Therapy

## 2013-06-27 ENCOUNTER — Ambulatory Visit: Payer: BC Managed Care – PPO

## 2013-06-27 ENCOUNTER — Ambulatory Visit: Payer: BC Managed Care – PPO | Admitting: Occupational Therapy

## 2013-06-27 DIAGNOSIS — Z5189 Encounter for other specified aftercare: Secondary | ICD-10-CM | POA: Diagnosis not present

## 2013-06-28 ENCOUNTER — Ambulatory Visit (INDEPENDENT_AMBULATORY_CARE_PROVIDER_SITE_OTHER): Payer: BC Managed Care – PPO | Admitting: Pharmacist

## 2013-06-28 DIAGNOSIS — Z5181 Encounter for therapeutic drug level monitoring: Secondary | ICD-10-CM

## 2013-06-28 DIAGNOSIS — I059 Rheumatic mitral valve disease, unspecified: Secondary | ICD-10-CM

## 2013-06-28 DIAGNOSIS — Z954 Presence of other heart-valve replacement: Secondary | ICD-10-CM

## 2013-06-28 LAB — POCT INR: INR: 2

## 2013-07-11 ENCOUNTER — Ambulatory Visit (INDEPENDENT_AMBULATORY_CARE_PROVIDER_SITE_OTHER): Payer: BC Managed Care – PPO | Admitting: Surgery

## 2013-07-11 DIAGNOSIS — Z954 Presence of other heart-valve replacement: Secondary | ICD-10-CM

## 2013-07-11 DIAGNOSIS — Z5181 Encounter for therapeutic drug level monitoring: Secondary | ICD-10-CM

## 2013-07-11 DIAGNOSIS — I059 Rheumatic mitral valve disease, unspecified: Secondary | ICD-10-CM

## 2013-07-11 LAB — POCT INR: INR: 3.1

## 2013-07-24 ENCOUNTER — Other Ambulatory Visit: Payer: Self-pay

## 2013-07-24 MED ORDER — POTASSIUM CHLORIDE CRYS ER 20 MEQ PO TBCR
20.0000 meq | EXTENDED_RELEASE_TABLET | Freq: Every day | ORAL | Status: DC
Start: 2013-07-24 — End: 2015-05-29

## 2013-07-24 MED ORDER — METOPROLOL TARTRATE 25 MG PO TABS: 25.0000 mg | ORAL_TABLET | Freq: Two times a day (BID) | ORAL | Status: DC

## 2013-07-26 ENCOUNTER — Telehealth: Payer: Self-pay | Admitting: Interventional Cardiology

## 2013-07-29 ENCOUNTER — Ambulatory Visit (INDEPENDENT_AMBULATORY_CARE_PROVIDER_SITE_OTHER): Payer: BC Managed Care – PPO | Admitting: Surgery

## 2013-07-29 DIAGNOSIS — I059 Rheumatic mitral valve disease, unspecified: Secondary | ICD-10-CM

## 2013-07-29 DIAGNOSIS — Z5181 Encounter for therapeutic drug level monitoring: Secondary | ICD-10-CM

## 2013-07-29 DIAGNOSIS — Z954 Presence of other heart-valve replacement: Secondary | ICD-10-CM

## 2013-07-29 LAB — POCT INR: INR: 3.2

## 2013-07-29 NOTE — Telephone Encounter (Addendum)
Walk in patient form received on 7.17.15 with FMLA paperwork attached. Held until Taylor returned on 7.20.15. She advised me that Dr.Varanasi will complete for the patient. The form was taken to Amy S on 7.20.15:djc

## 2013-08-07 ENCOUNTER — Telehealth: Payer: Self-pay | Admitting: Interventional Cardiology

## 2013-08-07 NOTE — Telephone Encounter (Signed)
Pt Dropped Off a Geographical information systems officer Statement that Needs to be Completed by Dr.Varanansi this  Was Given To Amy  7.29.15/km

## 2013-08-09 ENCOUNTER — Encounter: Payer: Self-pay | Admitting: Internal Medicine

## 2013-08-13 ENCOUNTER — Telehealth: Payer: Self-pay | Admitting: Interventional Cardiology

## 2013-08-13 NOTE — Telephone Encounter (Signed)
FMLA/[employer] Statement Completed by Dr.Varanasi LMOVM For Pt 8.4.15/km

## 2013-08-26 ENCOUNTER — Ambulatory Visit (INDEPENDENT_AMBULATORY_CARE_PROVIDER_SITE_OTHER): Payer: BC Managed Care – PPO | Admitting: *Deleted

## 2013-08-26 DIAGNOSIS — I059 Rheumatic mitral valve disease, unspecified: Secondary | ICD-10-CM

## 2013-08-26 DIAGNOSIS — Z954 Presence of other heart-valve replacement: Secondary | ICD-10-CM

## 2013-08-26 DIAGNOSIS — Z5181 Encounter for therapeutic drug level monitoring: Secondary | ICD-10-CM

## 2013-08-26 LAB — POCT INR: INR: 3.7

## 2013-09-11 ENCOUNTER — Ambulatory Visit: Payer: BC Managed Care – PPO | Admitting: Internal Medicine

## 2013-09-18 ENCOUNTER — Telehealth: Payer: Self-pay | Admitting: Interventional Cardiology

## 2013-09-18 NOTE — Telephone Encounter (Signed)
Pt Picked up FMLA/ [employer] Statement  9.9.15/km

## 2013-09-23 ENCOUNTER — Ambulatory Visit (INDEPENDENT_AMBULATORY_CARE_PROVIDER_SITE_OTHER): Payer: BC Managed Care – PPO

## 2013-09-23 DIAGNOSIS — I059 Rheumatic mitral valve disease, unspecified: Secondary | ICD-10-CM

## 2013-09-23 DIAGNOSIS — Z5181 Encounter for therapeutic drug level monitoring: Secondary | ICD-10-CM

## 2013-09-23 DIAGNOSIS — Z954 Presence of other heart-valve replacement: Secondary | ICD-10-CM

## 2013-09-23 LAB — POCT INR: INR: 3.1

## 2013-09-23 MED ORDER — WARFARIN SODIUM 4 MG PO TABS: ORAL_TABLET | ORAL | Status: DC

## 2013-09-25 ENCOUNTER — Other Ambulatory Visit: Payer: Self-pay | Admitting: Internal Medicine

## 2013-10-03 ENCOUNTER — Other Ambulatory Visit: Payer: Self-pay

## 2013-10-07 ENCOUNTER — Encounter: Payer: Self-pay | Admitting: Internal Medicine

## 2013-10-07 ENCOUNTER — Ambulatory Visit (INDEPENDENT_AMBULATORY_CARE_PROVIDER_SITE_OTHER): Payer: BC Managed Care – PPO | Admitting: Internal Medicine

## 2013-10-07 VITALS — BP 162/92 | HR 89 | Ht 67.5 in | Wt 208.8 lb

## 2013-10-07 DIAGNOSIS — I4891 Unspecified atrial fibrillation: Secondary | ICD-10-CM

## 2013-10-07 DIAGNOSIS — I509 Heart failure, unspecified: Secondary | ICD-10-CM

## 2013-10-07 DIAGNOSIS — R0602 Shortness of breath: Secondary | ICD-10-CM

## 2013-10-07 DIAGNOSIS — Z79899 Other long term (current) drug therapy: Secondary | ICD-10-CM

## 2013-10-07 DIAGNOSIS — I447 Left bundle-branch block, unspecified: Secondary | ICD-10-CM | POA: Insufficient documentation

## 2013-10-07 DIAGNOSIS — I4892 Unspecified atrial flutter: Secondary | ICD-10-CM

## 2013-10-07 DIAGNOSIS — I10 Essential (primary) hypertension: Secondary | ICD-10-CM

## 2013-10-07 DIAGNOSIS — Z952 Presence of prosthetic heart valve: Secondary | ICD-10-CM

## 2013-10-07 DIAGNOSIS — I071 Rheumatic tricuspid insufficiency: Secondary | ICD-10-CM

## 2013-10-07 DIAGNOSIS — Z954 Presence of other heart-valve replacement: Secondary | ICD-10-CM

## 2013-10-07 DIAGNOSIS — I519 Heart disease, unspecified: Secondary | ICD-10-CM

## 2013-10-07 DIAGNOSIS — I079 Rheumatic tricuspid valve disease, unspecified: Secondary | ICD-10-CM

## 2013-10-07 LAB — HEPATIC FUNCTION PANEL
ALT: 21 U/L (ref 0–35)
AST: 35 U/L (ref 0–37)
Albumin: 4 g/dL (ref 3.5–5.2)
Alkaline Phosphatase: 145 U/L — ABNORMAL HIGH (ref 39–117)
BILIRUBIN DIRECT: 0.2 mg/dL (ref 0.0–0.3)
BILIRUBIN TOTAL: 0.8 mg/dL (ref 0.2–1.2)
Total Protein: 8.6 g/dL — ABNORMAL HIGH (ref 6.0–8.3)

## 2013-10-07 MED ORDER — CARVEDILOL 6.25 MG PO TABS: 6.2500 mg | ORAL_TABLET | Freq: Two times a day (BID) | ORAL | Status: DC

## 2013-10-07 NOTE — Patient Instructions (Signed)
Your physician has recommended you make the following change in your medication:  1.) stop metoprolol 2.) begin coreg 6.25 mg twice daily Your physician recommends that you return for lab work today (amiodarone level, lft, tsh, t4,)  Your physician has requested that you have an echocardiogram. Echocardiography is a painless test that uses sound waves to create images of your heart. It provides your doctor with information about the size and shape of your heart and how well your heart's chambers and valves are working. This procedure takes approximately one hour. There are no restrictions for this procedure.  Your physician recommends that you keep your already scheduled appointment with Dr. Irish Lack. Follow up with Dr. Rayann Heman as needed.

## 2013-10-07 NOTE — Progress Notes (Signed)
PCP:  [name],[name], PA-C Primary Cardiologist:  Dr Irish Lack  The patient presents today for routine electrophysiology followup.   She is s/p tricupid repair, mechanical MV, and Maze by Dr [name] 2/14.   She continues to have fatigue and exertional shortness of breath but appears to be doing well overall.  Her atrial flutter is controlled.  She denies symptoms of arrhythmia. Today, she denies symptoms of palpitations, exertional chest pain, lower extremity edema, dizziness, presyncope, syncope, or further neurologic sequela. + orthopnea.  The patient feels that she is tolerating medications without difficulties and is otherwise without complaint today.   ROS- weight loss, change in vision, sleep apnea,  Cold intolerance, all other systems are reviewed and negative except as per HPI above  Past Medical History  Diagnosis Date  . Asthma   . Hypertension   . Obesity   . Chronic diastolic congestive heart failure   . Carotid bruit   . Hypercholesterolemia   . Polyp of rectum   . Abnormal chest CT   . Persistent atrial fibrillation   . Bronchospasm 1998  . Atrial enlargement, left     severe  . Heart murmur   . Sleep apnea     DOES NOT HAVE CPAP  . Diabetes mellitus     insulin dependent  . Mitral regurgitation   . Tricuspid regurgitation 01/16/2012  . Rheumatic fever 01/16/2012    Reported during childhood  . Atrial fibrillation 01/05/2011    Chronic persistent, failed DCCV   . Obesity (BMI 30-39.9) 01/16/2012  . Shortness of breath     with exertion  . History of blood transfusion   . S/P mitral valve replacement with metallic valve 0/03/4915    67mm Sorin Carbomedics Optiform mechanical prosthesis  . 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    6mm Edwards mc3 ring annuloplasty  . S/P Maze operation for atrial fibrillation 02/15/2012    Complete biatrial lesion set using bipolar radiofrequency and cryothermy ablation with clipping of LA appendage  . Atrial flutter    Past Surgical History   Procedure Laterality Date  . Cardiovascular stress test  09/2010  . Cardiac catheterization  >5 years  . Cardioversion  03/25/2011    Procedure: CARDIOVERSION;  Surgeon: Jettie Booze, MD;  Location: [facility];  Service: Cardiovascular;  Laterality: [name];  . Tee without cardioversion  12/21/2011    Procedure: TRANSESOPHAGEAL ECHOCARDIOGRAM (TEE);  Surgeon: Jettie Booze, MD;  Location: [facility];  Service: Cardiovascular;  Laterality: [name];  . No past surgeries    . Tricuspid valve replacement  02/15/2012    Procedure: TRICUSPID VALVE REPAIR;  Surgeon: Rexene Alberts, MD;  Location: [facility];  Service: Open Heart Surgery;  Laterality: [name];  . Maze  02/15/2012    Procedure: MAZE;  Surgeon: Rexene Alberts, MD;  Location: [facility];  Service: Open Heart Surgery;  Laterality: [name];  . Intraoperative transesophageal echocardiogram  02/15/2012    Procedure: INTRAOPERATIVE TRANSESOPHAGEAL ECHOCARDIOGRAM;  Surgeon: Rexene Alberts, MD;  Location: [facility];  Service: Open Heart Surgery;  Laterality: [name];  . Mitral valve replacement  02/15/2012    Procedure: MITRAL VALVE (MV) REPLACEMENT;  Surgeon: Rexene Alberts, MD;  Location: [facility];  Service: Open Heart Surgery;  Laterality: [name];  . Cardioversion [name] 07/27/2012    Procedure: CARDIOVERSION;  Surgeon: Jettie Booze, MD;  Location: [facility] ENDOSCOPY;  Service: Cardiovascular;  Laterality: [name];    Current Outpatient Prescriptions  Medication Sig Dispense Refill  . albuterol (PROVENTIL HFA;VENTOLIN  HFA) 108 (90 BASE) MCG/ACT inhaler Inhale 2 puffs into the lungs every 6 (six) hours as needed. For wheezing      . amiodarone (PACERONE) 200 MG tablet TAKE 1 TABLET (200 MG TOTAL) BY MOUTH 2 (TWO) TIMES DAILY.  60 tablet  0  . amLODipine (NORVASC) 5 MG tablet Take 1 tablet (5 mg total) by mouth daily.  90 tablet  3  . atorvastatin (LIPITOR) 40 MG tablet TAKE 1 TABLET BY MOUTH EVERY DAY  30 tablet  5  . calcium carbonate (OS-CAL - DOSED IN MG OF ELEMENTAL  CALCIUM) 1250 MG tablet Take 1 tablet by mouth daily.        . Cholecalciferol (VITAMIN D) 1000 UNITS capsule Take 1,000 Units by mouth daily.       . cyclobenzaprine (FLEXERIL) 10 MG tablet Take 1 tablet by mouth daily as needed. Muscle spasms      . furosemide (LASIX) 40 MG tablet Take 1 tablet (40 mg total) by mouth 2 (two) times daily.  180 tablet  1  . insulin glargine (LANTUS SOLOSTAR) 100 UNIT/ML injection Inject 30-35 Units into the skin See admin instructions. Inject 30 units once daily and inject 35 units daily at bedtime.      . irbesartan (AVAPRO) 300 MG tablet Take 1 tablet (300 mg total) by mouth daily.  90 tablet  3  . metFORMIN (GLUCOPHAGE) 500 MG tablet Take 500 mg by mouth 2 (two) times daily with a meal.       . methocarbamol (ROBAXIN) 500 MG tablet Take 500 mg by mouth daily as needed for muscle spasms.       . Omega-3 Fatty Acids (FISH OIL) 1000 MG CAPS Take 1 capsule by mouth daily.       . ONGLYZA 5 MG TABS tablet Take 5 mg by mouth daily.       . potassium chloride SA (K-DUR,KLOR-CON) 20 MEQ tablet Take 1 tablet (20 mEq total) by mouth daily.  30 tablet  6  . warfarin (COUMADIN) 4 MG tablet Take as directed by coumadin clinic  60 tablet  3  . carvedilol (COREG) 6.25 MG tablet Take 1 tablet (6.25 mg total) by mouth 2 (two) times daily with a meal.  60 tablet  11  . [DISCONTINUED] flecainide (TAMBOCOR) 100 MG tablet Take 1 tablet (100 mg total) by mouth every 12 (twelve) hours.  60 tablet  11   No current facility-administered medications for this visit.    No Known Allergies  History   Social History  . Marital Status: Married    Spouse Name: [name]    Number of Children: [name]  . Years of Education: [name]   Occupational History  . Conservation officer, [employer]   Social History Main Topics  . Smoking status: Never Smoker   . Smokeless tobacco: Never Used  . Alcohol Use: No  . Drug Use: No  . Sexual Activity:    Other Topics Concern  . Not on file    Social History Narrative   Pt lives in [place] with spouse.  Works at [employer]. 2 grown children, 1 grandchild    Family History  Problem Relation Age of Onset  . Asthma Mother     Physical Exam: Filed Vitals:   10/07/13 1003  BP: 162/92  Pulse: 89  Height: 5' 7.5" (1.715 m)  Weight: 208 lb 12.8 oz (94.711 kg)    GEN- The patient is well appearing, alert and oriented x 3  today.   Head- normocephalic, atraumatic Eyes-  Sclera clear, conjunctiva pink Ears- hearing intact Oropharynx- clear Neck- supple, no JVP Lymph- no cervical lymphadenopathy Lungs- Clear to ausculation bilaterally, normal work of breathing Heart- regular rate and rhythm, mechanical S1 GI- soft, NT, ND, + BS Extremities- no clubbing, cyanosis, trace edema  ekg today reveals sinus rhythm with LBBB Echo 05/28/12 ([facility])- EF 20-25%, mechanical MV, moderate RV dysfunction, moderately elevated RV pressure, restrictive physiology, LA 23mm  Assessment and Plan:   1. Afib/ atrial flutter Her LA is quite large however she appears to be maintaining sinus rhythm. She has been on amiodarone 200mg  BID for quite some time.  I will check LFTs/TFTs and an amiodarone level today.  If amiodarone level supports, she should have her amiodarone decreased to 200mg  daily when she sees Dr Irish Lack.  If her level however is low then perhaps she should continue her current dose.  Continue long term anticoagulation with coumadin.  2. Nonischemic CM Possibly due to cardiac surgery as well as worsened by afib with RVR When I saw her 6/14, I recommended repeat echo after 3 months of sinus rhythm.  She does not appear to have had this echo. I will therefore repeat an echo at this time. Rate control afib, pursue sinus  (as above) Continue to optimize medical therapy.    IF her EF remains < 35%, then she may be a candidate for BiV ICD.  She will follow-up with Dr Irish Lack after her echo.  3. LBBB As  above  Follow-up with Dr Irish Lack as above If her EF is > 35% then I will see as needed going forward

## 2013-10-09 LAB — AMIODARONE LEVEL
Amiodarone Lvl: 0.3 ug/mL — ABNORMAL LOW (ref 1.5–2.5)
Desethylamiodarone: 0.3 ug/mL — ABNORMAL LOW (ref 1.5–2.5)

## 2013-10-11 ENCOUNTER — Ambulatory Visit (HOSPITAL_COMMUNITY): Payer: BC Managed Care – PPO | Attending: Cardiovascular Disease | Admitting: Radiology

## 2013-10-11 DIAGNOSIS — Z8673 Personal history of transient ischemic attack (TIA), and cerebral infarction without residual deficits: Secondary | ICD-10-CM | POA: Diagnosis not present

## 2013-10-11 DIAGNOSIS — R0602 Shortness of breath: Secondary | ICD-10-CM | POA: Diagnosis present

## 2013-10-11 DIAGNOSIS — I10 Essential (primary) hypertension: Secondary | ICD-10-CM | POA: Diagnosis not present

## 2013-10-11 DIAGNOSIS — E119 Type 2 diabetes mellitus without complications: Secondary | ICD-10-CM | POA: Diagnosis not present

## 2013-10-11 DIAGNOSIS — Z87898 Personal history of other specified conditions: Secondary | ICD-10-CM | POA: Insufficient documentation

## 2013-10-11 DIAGNOSIS — E780 Pure hypercholesterolemia: Secondary | ICD-10-CM | POA: Diagnosis not present

## 2013-10-11 DIAGNOSIS — I519 Heart disease, unspecified: Secondary | ICD-10-CM | POA: Insufficient documentation

## 2013-10-11 DIAGNOSIS — E669 Obesity, unspecified: Secondary | ICD-10-CM | POA: Diagnosis not present

## 2013-10-11 DIAGNOSIS — I509 Heart failure, unspecified: Secondary | ICD-10-CM | POA: Diagnosis not present

## 2013-10-11 NOTE — Progress Notes (Signed)
Echocardiogram performed.  

## 2013-10-26 ENCOUNTER — Other Ambulatory Visit: Payer: Self-pay | Admitting: Interventional Cardiology

## 2013-10-29 ENCOUNTER — Ambulatory Visit (INDEPENDENT_AMBULATORY_CARE_PROVIDER_SITE_OTHER): Payer: BC Managed Care – PPO | Admitting: *Deleted

## 2013-10-29 ENCOUNTER — Ambulatory Visit: Payer: BC Managed Care – PPO | Admitting: Interventional Cardiology

## 2013-10-29 DIAGNOSIS — Z5181 Encounter for therapeutic drug level monitoring: Secondary | ICD-10-CM

## 2013-10-29 DIAGNOSIS — I059 Rheumatic mitral valve disease, unspecified: Secondary | ICD-10-CM

## 2013-10-29 LAB — POCT INR: INR: 1.5

## 2013-11-01 ENCOUNTER — Other Ambulatory Visit: Payer: Self-pay

## 2013-11-01 MED ORDER — ATORVASTATIN CALCIUM 40 MG PO TABS: ORAL_TABLET | ORAL | Status: DC

## 2013-11-11 ENCOUNTER — Ambulatory Visit (INDEPENDENT_AMBULATORY_CARE_PROVIDER_SITE_OTHER): Payer: Self-pay | Admitting: *Deleted

## 2013-11-11 DIAGNOSIS — Z5181 Encounter for therapeutic drug level monitoring: Secondary | ICD-10-CM

## 2013-11-11 DIAGNOSIS — I059 Rheumatic mitral valve disease, unspecified: Secondary | ICD-10-CM

## 2013-11-11 LAB — POCT INR: INR: 2

## 2013-11-21 ENCOUNTER — Other Ambulatory Visit: Payer: Self-pay | Admitting: Physician Assistant

## 2013-11-21 ENCOUNTER — Other Ambulatory Visit (HOSPITAL_COMMUNITY)
Admission: RE | Admit: 2013-11-21 | Discharge: 2013-11-21 | Disposition: A | Discharge status: Home / Self Care | Payer: Self-pay | Source: Ambulatory Visit | Attending: Family Medicine | Admitting: Family Medicine

## 2013-11-21 DIAGNOSIS — Z01419 Encounter for gynecological examination (general) (routine) without abnormal findings: Secondary | ICD-10-CM | POA: Insufficient documentation

## 2013-11-22 ENCOUNTER — Ambulatory Visit (INDEPENDENT_AMBULATORY_CARE_PROVIDER_SITE_OTHER): Payer: Self-pay | Admitting: *Deleted

## 2013-11-22 DIAGNOSIS — Z5181 Encounter for therapeutic drug level monitoring: Secondary | ICD-10-CM

## 2013-11-22 DIAGNOSIS — I059 Rheumatic mitral valve disease, unspecified: Secondary | ICD-10-CM

## 2013-11-22 LAB — POCT INR: INR: 3

## 2013-11-26 LAB — CYTOLOGY - PAP

## 2013-11-29 ENCOUNTER — Encounter: Payer: Self-pay | Admitting: Interventional Cardiology

## 2013-12-10 ENCOUNTER — Ambulatory Visit (INDEPENDENT_AMBULATORY_CARE_PROVIDER_SITE_OTHER): Payer: Self-pay

## 2013-12-10 DIAGNOSIS — I059 Rheumatic mitral valve disease, unspecified: Secondary | ICD-10-CM

## 2013-12-10 DIAGNOSIS — Z5181 Encounter for therapeutic drug level monitoring: Secondary | ICD-10-CM

## 2013-12-10 LAB — POCT INR: INR: 1.9

## 2013-12-24 ENCOUNTER — Ambulatory Visit (INDEPENDENT_AMBULATORY_CARE_PROVIDER_SITE_OTHER): Payer: Self-pay

## 2013-12-24 DIAGNOSIS — I059 Rheumatic mitral valve disease, unspecified: Secondary | ICD-10-CM

## 2013-12-24 DIAGNOSIS — Z5181 Encounter for therapeutic drug level monitoring: Secondary | ICD-10-CM

## 2013-12-24 LAB — POCT INR: INR: 2.4

## 2014-01-14 ENCOUNTER — Ambulatory Visit (INDEPENDENT_AMBULATORY_CARE_PROVIDER_SITE_OTHER): Payer: Self-pay | Admitting: *Deleted

## 2014-01-14 DIAGNOSIS — Z5181 Encounter for therapeutic drug level monitoring: Secondary | ICD-10-CM

## 2014-01-14 DIAGNOSIS — I059 Rheumatic mitral valve disease, unspecified: Secondary | ICD-10-CM

## 2014-01-14 LAB — POCT INR: INR: 2.9

## 2014-02-11 ENCOUNTER — Ambulatory Visit (INDEPENDENT_AMBULATORY_CARE_PROVIDER_SITE_OTHER): Payer: Self-pay | Admitting: *Deleted

## 2014-02-11 DIAGNOSIS — Z5181 Encounter for therapeutic drug level monitoring: Secondary | ICD-10-CM

## 2014-02-11 DIAGNOSIS — I059 Rheumatic mitral valve disease, unspecified: Secondary | ICD-10-CM

## 2014-02-11 LAB — POCT INR: INR: 2

## 2014-02-11 MED ORDER — WARFARIN SODIUM 4 MG PO TABS: ORAL_TABLET | ORAL | Status: DC

## 2014-03-03 ENCOUNTER — Ambulatory Visit (INDEPENDENT_AMBULATORY_CARE_PROVIDER_SITE_OTHER): Payer: Self-pay | Admitting: *Deleted

## 2014-03-03 ENCOUNTER — Ambulatory Visit (INDEPENDENT_AMBULATORY_CARE_PROVIDER_SITE_OTHER): Payer: Self-pay | Admitting: Thoracic Surgery (Cardiothoracic Vascular Surgery)

## 2014-03-03 ENCOUNTER — Encounter: Payer: Self-pay | Admitting: Thoracic Surgery (Cardiothoracic Vascular Surgery)

## 2014-03-03 VITALS — BP 151/84 | HR 80 | Resp 16 | Ht 67.0 in | Wt 224.0 lb

## 2014-03-03 DIAGNOSIS — Z5181 Encounter for therapeutic drug level monitoring: Secondary | ICD-10-CM

## 2014-03-03 DIAGNOSIS — I481 Persistent atrial fibrillation: Secondary | ICD-10-CM

## 2014-03-03 DIAGNOSIS — Z9889 Other specified postprocedural states: Secondary | ICD-10-CM

## 2014-03-03 DIAGNOSIS — I4819 Other persistent atrial fibrillation: Secondary | ICD-10-CM

## 2014-03-03 DIAGNOSIS — I071 Rheumatic tricuspid insufficiency: Secondary | ICD-10-CM

## 2014-03-03 DIAGNOSIS — Z8679 Personal history of other diseases of the circulatory system: Secondary | ICD-10-CM

## 2014-03-03 DIAGNOSIS — Z954 Presence of other heart-valve replacement: Secondary | ICD-10-CM

## 2014-03-03 DIAGNOSIS — Z952 Presence of prosthetic heart valve: Secondary | ICD-10-CM

## 2014-03-03 DIAGNOSIS — I340 Nonrheumatic mitral (valve) insufficiency: Secondary | ICD-10-CM

## 2014-03-03 DIAGNOSIS — I059 Rheumatic mitral valve disease, unspecified: Secondary | ICD-10-CM

## 2014-03-03 LAB — POCT INR: INR: 4.3

## 2014-03-03 NOTE — Progress Notes (Signed)
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 She was last seen here in the office on 02/25/2013.  Since then she has continued to remain very stable from a cardiac standpoint. She has maintained sinus rhythm. She continues to experience chronic symptoms of exertional shortness of breath and fatigue consistent with chronic combined systolic and diastolic congestive heart failure, New York Heart Association functional class III. Follow-up echocardiogram performed last October demonstrates moderate global left ventricular systolic dysfunction with ejection fraction estimated 35-40%. There was severe dyssynchrony noted and the patient has chronic left bundle branch block on EKG.  She has stable mild to moderate aortic insufficiency. The mechanical prosthesis in the mitral position is functioning normally.  The patient states that overall she feels better than she did a year ago, but she still complains that she is not able to get around much. She has never completely recovered from the stroke she suffered in April 2014, and this limits her physical stability and mobility. However, the patient states that her primary limitation is that of exertional shortness of breath.  She has not had any problems with long-term anticoagulation therapy on warfarin.   Current Outpatient Prescriptions  Medication Sig Dispense Refill  . albuterol (PROVENTIL HFA;VENTOLIN HFA) 108 (90 BASE) MCG/ACT inhaler Inhale 2 puffs into the lungs every 6 (six) hours as needed. For wheezing    . amiodarone (PACERONE) 200 MG tablet TAKE 1 TABLET (200 MG TOTAL) BY MOUTH 2 (TWO) TIMES  DAILY. 60 tablet 0  . amLODipine (NORVASC) 5 MG tablet Take 1 tablet (5 mg total) by mouth daily. 90 tablet 3  . atorvastatin (LIPITOR) 40 MG tablet TAKE 1 TABLET BY MOUTH EVERY DAY 30 tablet 5  . calcium carbonate (OS-CAL - DOSED IN MG OF ELEMENTAL CALCIUM) 1250 MG tablet Take 1 tablet by mouth daily.      . carvedilol (COREG) 6.25 MG tablet Take 1 tablet (6.25 mg total) by mouth 2 (two) times daily with a meal. 60 tablet 11  . Cholecalciferol (VITAMIN D) 1000 UNITS capsule Take 1,000 Units by mouth daily.     . cyclobenzaprine (FLEXERIL) 10 MG tablet Take 1 tablet by mouth daily as needed. Muscle spasms    . furosemide (LASIX) 40 MG tablet Take 1 tablet (40 mg total) by mouth 2 (two) times daily. 180 tablet 1  . insulin glargine (LANTUS SOLOSTAR) 100 UNIT/ML injection Inject 30-35 Units into the skin See admin instructions. Inject 30 units once daily and inject 35 units daily at bedtime.    . irbesartan (AVAPRO) 300 MG tablet Take 1 tablet (300 mg total) by mouth daily. 90 tablet 3  . metFORMIN (GLUCOPHAGE) 500 MG tablet Take 500 mg by mouth 2 (two) times daily with a meal.     . methocarbamol (ROBAXIN) 500 MG tablet Take 500 mg by mouth daily as needed for muscle spasms.     . Omega-3 Fatty Acids (FISH OIL) 1000 MG CAPS Take 1 capsule by mouth daily.     . ONGLYZA 5 MG TABS tablet Take 5 mg by mouth daily.     [name]  potassium chloride SA (K-DUR,KLOR-CON) 20 MEQ tablet Take 1 tablet (20 mEq total) by mouth daily. 30 tablet 6  . warfarin (COUMADIN) 4 MG tablet Take as directed by coumadin clinic 70 tablet 3  . [DISCONTINUED] flecainide (TAMBOCOR) 100 MG tablet Take 1 tablet (100 mg total) by mouth every 12 (twelve) hours. 60 tablet 11   No current facility-administered medications for this visit.      Physical Exam:   BP 151/84 mmHg  Pulse 80  Resp 16  Ht 5\' 7"  (1.702 m)  Wt 224 lb (101.606 kg)  BMI 35.08 kg/m2  SpO2 98%  General:  Well-appearing  Chest:   Clear to  auscultation  CV:   Regular rate and rhythm without murmur  Incisions:  Completely healed, sternum is stable  Abdomen:  Soft and nontender  Extremities:  Warm and well perfused, no lower extremity edema  Diagnostic Tests:  Transthoracic Echocardiography  Patient:  Natalie, [name] MR #:    [id number] Study Date: 10/11/2013 Gender:   F Age:    59 Height:   170.2 cm Weight:   94.3 kg BSA:    2.14 m^2 Pt. Status: Room:  ATTENDING  Dorris Carnes, M.D. ORDERING   Thompson Grayer, MD REFERRING  Thompson Grayer, MD SONOGRAPHER Cindy Hazy, RDCS PERFORMING  [facility], Outpatient  cc:  ------------------------------------------------------------------- LV EF: 35% -  40%  ------------------------------------------------------------------- Indications:   R06.02 Shortness of Breath.. I50.9 Congestive Heart Failure.[name]  ------------------------------------------------------------------- History:  PMH: Acquired from the patient and from the patient&'s chart. PMH: LBBB. Atrial Flutter. CVA. Rheumatic Fever. Shortness of Breath. Fatigue. Sleep Apnea. Risk factors: Hypertension. Diabetes mellitus. Obese. Hypercholesterolemia.  ------------------------------------------------------------------- Study Conclusions  - Left ventricle: The cavity size was normal. There was mild concentric hypertrophy. Systolic function was moderately reduced. The estimated ejection fraction was in the range of 35% to 40%. Diffuse hypokinesis. There is profound systolic dyssynchrony. The study is not technically sufficient to allow evaluation of LV diastolic function. - Ventricular septum: Septal motion showed abnormal function, dyssynergy, and paradox. These changes are consistent with a left bundle branch block. - Aortic valve: There was mild to moderate regurgitation directed centrally in the LVOT. - Mitral valve: A mechanical prosthesis was present and  functioning normally. - Left atrium: The atrium was severely dilated. - Right ventricle: The cavity size was mildly dilated. - Right atrium: The atrium was severely dilated. - Tricuspid valve: Prior procedures included surgical repair. A ring annulus prosthesis was present. The findings are consistent with mild stenosis post annuloplasty There was mild-moderate regurgitation directed centrally. There was mild-moderate perivalvular regurgitation. - Pulmonary arteries: Systolic pressure was moderately to severely increased. PA peak pressure: 71 mm Hg (S).  Impressions:  - LVEF remains depressed, but has improved substantially since April 2014. Transmitral gradients are surprisingly high for a 31 mm mechanical valve (mean 8 mm Hg at 77 bpm), but the pressure half time is brisk, suggesting high flow. No mitral insufficiency is seen by 2D echo. Consider TEE.  ------------------------------------------------------------------- Labs, prior tests, procedures, and surgery: Mitral Valve Replacement-31 mm Sorin Carbomedics Optiform Mechanical Prosthesis. Triscupid Valve Repair- 28 mm edwards mc3 ring Annuloplasty.     Transthoracic echocardiography. M-mode, complete 2D, spectral Doppler, and color Doppler. Birthdate: Patient birthdate: [birth year]. Age: Patient is 59 yr old. Sex: Gender: female.  BMI: 32.6 kg/m^2. Blood pressure: 162/92 Patient status: Outpatient. Study date: Study date: 10/11/2013. Study time: 08:03 AM. Location: [facility]  -------------------------------------------------------------------  ------------------------------------------------------------------- Left ventricle: The cavity size was normal.  There was mild concentric hypertrophy. Systolic function was moderately reduced. The estimated ejection fraction was in the range of 35% to 40%. Diffuse hypokinesis. The study is not technically sufficient to allow evaluation  of LV diastolic function.  ------------------------------------------------------------------- Aortic valve:  Mildly thickened leaflets. Cusp separation was normal. Doppler: Transvalvular velocity was within the normal range. There was no stenosis. There was mild to moderate regurgitation directed centrally in the LVOT.  ------------------------------------------------------------------- Mitral valve: A mechanical prosthesis was present and functioning normally. Doppler:   Valve area by pressure half-time: 3.24 cm^2. Indexed valve area by pressure half-time: 1.51 cm^2/m^2. Valve area by continuity equation (using LVOT flow): 2.03 cm^2. Indexed valve area by continuity equation (using LVOT flow): 0.95 cm^2/m^2.  Mean gradient (D): 8 mm Hg. Peak gradient (D): 16 mm Hg.  ------------------------------------------------------------------- Left atrium: The atrium was severely dilated.  ------------------------------------------------------------------- Right ventricle: The cavity size was mildly dilated. Systolic function was normal.  ------------------------------------------------------------------- Ventricular septum:  Septal motion showed abnormal function, dyssynergy, and paradox. These changes are consistent with a left bundle branch block.  ------------------------------------------------------------------- Pulmonic valve:  The valve appears to be grossly normal. Doppler: There was no significant regurgitation.  ------------------------------------------------------------------- Tricuspid valve: Prior procedures included surgical repair. A ring annulus prosthesis was present. Doppler: The findings are consistent with mild stenosis post annuloplasty There was mild-moderate regurgitation directed centrally. There was mild-moderate perivalvular regurgitation.  Mean gradient (D): 5 mm  Hg.  ------------------------------------------------------------------- Pulmonary artery:  Systolic pressure was moderately to severely increased.  ------------------------------------------------------------------- Right atrium: The atrium was severely dilated.  ------------------------------------------------------------------- Pericardium: There was no pericardial effusion.  ------------------------------------------------------------------- Systemic veins: Inferior vena cava: The vessel was dilated. The respirophasic diameter changes were blunted (< 50%), consistent with elevated central venous pressure.  ------------------------------------------------------------------- Measurements  Left ventricle              Value     Reference LV ID, ED, PLAX chordal          46  mm    43 - 52 LV ID, ES, PLAX chordal          34.9 mm    23 - 38 LV fx shortening, PLAX chordal  (L)   24  %    >=29 LV PW thickness, ED            12.4 mm    --------- IVS/LV PW ratio, ED            1.23      <=1.3 Stroke volume, 2D             63  ml    --------- Stroke volume/bsa, 2D           29  ml/m^2  --------- LV e&', lateral              7.24 cm/s   --------- LV E/e&', lateral             28.04     --------- LV e&', medial               5.45 cm/s   --------- LV E/e&', medial              37.25     --------- LV e&', average              6.35 cm/s   --------- LV E/e&', average             31.99     ---------  Ventricular septum  Value     Reference IVS thickness, ED             15.3 mm    ---------  LVOT                   Value     Reference LVOT ID, S                20  mm     --------- LVOT area                 3.14 cm^2   --------- LVOT peak velocity, S           110  cm/s   --------- LVOT mean velocity, S           75.7 cm/s   --------- LVOT VTI, S                20  cm    ---------  Aortic valve               Value     Reference Aortic regurg pressure half-time     288  ms    ---------  Aorta                   Value     Reference Aortic root ID, ED            30  mm    ---------  Left atrium                Value     Reference LA ID, A-P, ES              50  mm    --------- LA ID/bsa, A-P          (H)   2.33 cm/m^2  <=2.2 LA volume, ES, 1-p A4C          150  ml    --------- LA volume/bsa, ES, 1-p A4C        69.9 ml/m^2  --------- LA volume, ES, 1-p A2C          139  ml    --------- LA volume/bsa, ES, 1-p A2C        64.8 ml/m^2  ---------  Mitral valve               Value     Reference Mitral E-wave peak velocity        203  cm/s   --------- Mitral A-wave peak velocity        109  cm/s   --------- Mitral mean velocity, D          126  cm/s   --------- Mitral deceleration time         180  ms    150 - 230 Mitral pressure half-time         67  ms    --------- Mitral mean gradient, D          8   mm Hg  --------- Mitral peak gradient, D          16  mm Hg  --------- Mitral E/A ratio, peak          1.9      --------- Mitral valve area, PHT, DP        3.24 cm^2   --------- Mitral valve area/bsa, PHT, DP      1.51 cm^2/m^2 --------- Mitral valve area, LVOT  2.03 cm^2   --------- continuity Mitral valve area/bsa, LVOT        0.95  cm^2/m^2 --------- continuity Mitral annulus VTI, D           30.9 cm    ---------  Pulmonary arteries            Value     Reference PA pressure, S, DP        (H)   71  mm Hg  <=30  Tricuspid valve              Value     Reference Tricuspid mean velocity, D        97.35 cm/s   --------- Tricuspid mean gradient, D        5   mm Hg  --------- Tricuspid maximal inflow         157  cm/s   --------- velocity, PISA Tricuspid VTI at annulus, D        311.3 mm    --------- Tricuspid regurg peak velocity      356  cm/s   --------- Tricuspid peak RV-RA gradient       51  mm Hg  ---------  Systemic veins              Value     Reference Estimated CVP               20  mm Hg  ---------  Right ventricle              Value     Reference RV pressure, S, DP        (H)   71  mm Hg  <=30 RV s&', lateral, S             9.9  cm/s   ---------  Legend: (L) and (H) mark values outside specified reference range.  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  The patient continues to maintain sinus rhythm, but she also experiences chronic exertional shortness of breath and fatigue consistent with chronic combined systolic and diastolic congestive heart failure, New York Heart Association functional class III.   Follow-up echocardiogram demonstrates moderate left ventricular systolic dysfunction with considerable dyssynchrony, and she has chronic left bundle branch block noted on EKG. She might benefit from CRT.    Plan:  The patient will continue to follow-up with Dr. Irish Lack and Dr. Rayann Heman.  I think it might be reasonable considering her biventricular pacing for CRT.  We have not recommended any change to the patient's current medications at this time. In the future the patient will call and return to see [place] as needed. She has been reminded regarding the lifelong need for antibiotic therapy for all dental cleaning and related procedures.  I spent in excess of 15 minutes during the conduct of this office consultation and >50% of this time involved direct face-to-face encounter with the patient for counseling and/or coordination of their care.   Valentina Gu. [name], MD 03/03/2014 1:31 PM

## 2014-03-03 NOTE — Patient Instructions (Signed)

## 2014-03-17 ENCOUNTER — Ambulatory Visit (INDEPENDENT_AMBULATORY_CARE_PROVIDER_SITE_OTHER): Payer: Self-pay | Admitting: *Deleted

## 2014-03-17 DIAGNOSIS — Z5181 Encounter for therapeutic drug level monitoring: Secondary | ICD-10-CM

## 2014-03-17 DIAGNOSIS — I059 Rheumatic mitral valve disease, unspecified: Secondary | ICD-10-CM

## 2014-03-17 LAB — POCT INR: INR: 5

## 2014-03-27 ENCOUNTER — Ambulatory Visit (INDEPENDENT_AMBULATORY_CARE_PROVIDER_SITE_OTHER): Payer: Self-pay | Admitting: *Deleted

## 2014-03-27 DIAGNOSIS — Z5181 Encounter for therapeutic drug level monitoring: Secondary | ICD-10-CM

## 2014-03-27 DIAGNOSIS — I059 Rheumatic mitral valve disease, unspecified: Secondary | ICD-10-CM

## 2014-03-27 LAB — POCT INR: INR: 5.1

## 2014-04-07 ENCOUNTER — Ambulatory Visit (INDEPENDENT_AMBULATORY_CARE_PROVIDER_SITE_OTHER): Payer: Self-pay

## 2014-04-07 DIAGNOSIS — Z5181 Encounter for therapeutic drug level monitoring: Secondary | ICD-10-CM

## 2014-04-07 DIAGNOSIS — I059 Rheumatic mitral valve disease, unspecified: Secondary | ICD-10-CM

## 2014-04-07 LAB — POCT INR: INR: 4.3

## 2014-04-14 ENCOUNTER — Encounter: Payer: Self-pay | Admitting: Interventional Cardiology

## 2014-04-14 ENCOUNTER — Ambulatory Visit (INDEPENDENT_AMBULATORY_CARE_PROVIDER_SITE_OTHER): Payer: Self-pay | Admitting: Interventional Cardiology

## 2014-04-14 VITALS — BP 162/94 | HR 85 | Ht 67.0 in | Wt 223.0 lb

## 2014-04-14 DIAGNOSIS — I4819 Other persistent atrial fibrillation: Secondary | ICD-10-CM

## 2014-04-14 DIAGNOSIS — I10 Essential (primary) hypertension: Secondary | ICD-10-CM

## 2014-04-14 DIAGNOSIS — I481 Persistent atrial fibrillation: Secondary | ICD-10-CM

## 2014-04-14 DIAGNOSIS — Z954 Presence of other heart-valve replacement: Secondary | ICD-10-CM

## 2014-04-14 DIAGNOSIS — I519 Heart disease, unspecified: Secondary | ICD-10-CM

## 2014-04-14 NOTE — Progress Notes (Signed)
Patient ID: [name], female   DOB: [birth year], 59 y.o.   MRN: [id number]    [place], [place], [place]  [address] Phone: [phone] Fax:  [phone]  Date:  04/14/2014   ID:  [name], DOB [birth year], MRN [id number]  PCP:  [name],[name], PA-C      History of Present Illness: [name] is a 59 y.o. female who had a mitral valve repair in 2014.  This was complicated by a post op CVA. She has had  atrial flutter. Dr. Rayann Heman recommended amio followed by cardioversion. She had the cardioversion a few months ago in 07/2012. She is out of work. She is on disability at this point.  She has some trouble going up stairs, and walking some distance even on flat ground.  Atrial Fibrillation F/U:  avoids lifting some days. c/o Leg edema- intermittent ( some good days). Occasional burning in the chest at the scar. Denies : Chest pain.  Dizziness.  Orthopnea.  Palpitations.  Syncope.   She is awaiting insurance after getting disability.  She has been out of work since July.  She has gained weight.  Wt Readings from Last 3 Encounters:  04/14/14 223 lb (101.152 kg)  03/03/14 224 lb (101.606 kg)  10/07/13 208 lb 12.8 oz (94.711 kg)     Past Medical History  Diagnosis Date  . Asthma   . Hypertension   . Obesity   . Chronic diastolic congestive heart failure   . Carotid bruit   . Hypercholesterolemia   . Polyp of rectum   . Abnormal chest CT   . Persistent atrial fibrillation   . Bronchospasm 1998  . Atrial enlargement, left     severe  . Heart murmur   . Sleep apnea     DOES NOT HAVE CPAP  . Diabetes mellitus     insulin dependent  . Mitral regurgitation   . Tricuspid regurgitation 01/16/2012  . Rheumatic fever 01/16/2012    Reported during childhood  . Atrial fibrillation 01/05/2011    Chronic persistent, failed DCCV   . Obesity (BMI 30-39.9) 01/16/2012  . Shortness of breath     with exertion  . History of blood transfusion   . S/P mitral valve  replacement with metallic valve 02/16/3783    45mm Sorin Carbomedics Optiform mechanical prosthesis  . 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 S/P Maze operation for atrial fibrillation 02/15/2012    Complete biatrial lesion set using bipolar radiofrequency and cryothermy ablation with clipping of LA appendage  . Atrial flutter     Current Outpatient Prescriptions  Medication Sig Dispense Refill  . albuterol (PROVENTIL HFA;VENTOLIN HFA) 108 (90 BASE) MCG/ACT inhaler Inhale 2 puffs into the lungs every 6 (six) hours as needed. For wheezing    . amiodarone (PACERONE) 200 MG tablet TAKE 1 TABLET (200 MG TOTAL) BY MOUTH 2 (TWO) TIMES DAILY. 60 tablet 0  . amLODipine (NORVASC) 5 MG tablet Take 1 tablet (5 mg total) by mouth daily. 90 tablet 3  . atorvastatin (LIPITOR) 40 MG tablet TAKE 1 TABLET BY MOUTH EVERY DAY 30 tablet 5  . calcium carbonate (OS-CAL - DOSED IN MG OF ELEMENTAL CALCIUM) 1250 MG tablet Take 1 tablet by mouth daily.      . carvedilol (COREG) 6.25 MG tablet Take 1 tablet (6.25 mg total) by mouth 2 (two) times daily with a meal. 60 tablet 11  . Cholecalciferol (  VITAMIN D) 1000 UNITS capsule Take 1,000 Units by mouth daily.     . cyclobenzaprine (FLEXERIL) 10 MG tablet Take 1 tablet by mouth daily as needed. Muscle spasms    . furosemide (LASIX) 40 MG tablet Take 1 tablet (40 mg total) by mouth 2 (two) times daily. 180 tablet 1  . insulin glargine (LANTUS SOLOSTAR) 100 UNIT/ML injection Inject 30-35 Units into the skin See admin instructions. Inject 30 units once daily and inject 35 units daily at bedtime.    . irbesartan (AVAPRO) 300 MG tablet Take 1 tablet (300 mg total) by mouth daily. 90 tablet 3  . metFORMIN (GLUCOPHAGE) 500 MG tablet Take 500 mg by mouth 2 (two) times daily with a meal.     . methocarbamol (ROBAXIN) 500 MG tablet Take 500 mg by mouth daily as needed for muscle spasms.     . metoprolol tartrate (LOPRESSOR) 25 MG tablet Take 25 mg  by mouth daily.  6  . Omega-3 Fatty Acids (FISH OIL) 1000 MG CAPS Take 1 capsule by mouth daily.     . ONGLYZA 5 MG TABS tablet Take 5 mg by mouth daily.     . potassium chloride SA (K-DUR,KLOR-CON) 20 MEQ tablet Take 1 tablet (20 mEq total) by mouth daily. 30 tablet 6  . warfarin (COUMADIN) 4 MG tablet Take as directed by coumadin clinic 70 tablet 3  . [DISCONTINUED] flecainide (TAMBOCOR) 100 MG tablet Take 1 tablet (100 mg total) by mouth every 12 (twelve) hours. 60 tablet 11   No current facility-administered medications for this visit.    Allergies:   No Known Allergies  Social History:  The patient  reports that she has never smoked. She has never used smokeless tobacco. She reports that she does not drink alcohol or use illicit drugs.   Family History:  The patient's family history includes Asthma in her mother; Diabetes in her mother.   ROS:  Please see the history of present illness.  No nausea, vomiting.  No fevers, chills.  No focal weakness.  No dysuria. Fatigue.     All other systems reviewed and negative.   PHYSICAL EXAM: VS:  BP 162/94 mmHg  Pulse 85  Ht 5\' 7"  (1.702 m)  Wt 223 lb (101.152 kg)  BMI 34.92 kg/m2 Well nourished, well developed, in no acute distress HEENT: normal Neck: no JVD, no carotid bruits Cardiac:  crisp S1 click,   Normal S2; RRR;  Lungs:  clear to auscultation bilaterally, no wheezing, rhonchi or rales Abd: soft, nontender, no hepatomegaly Ext: mild ankle  edemabilaterally Skin: warm and dry Neuro:   no focal abnormalities noted Psych: normal affect      ASSESSMENT AND PLAN:  H/O mitral valve replacement  Notes: needs SBE prophylaxis.  Mechanical mitral valve placed in February 2014.  2. 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  EF decreased post op.  Appears euvolemic at this time. No signs of heart failure.  3. SOB  Continue Lasix Tablet, 40 MG, 1 tablet, Orally,  BID; Move second dose to after lunch. Notes: Some fluid overload at times. Check BP at home.  She has not been checking.  Known LV systolic dysfunction.  Stressed importance of BP control to help forward blood flow.  She does not have a blood pressure cuff at home. She can check at a local drug store.  4. Atrial flutter  Notes: Sucessful cardioversion. Coumadin for stroke prevention. Amiodarone to  maintain NSR.  WOuld like to see if she can decrease to once a day Amiodarone.  WOuld defer to Dr. Rayann Heman if she can decrease to once a day amiodarone.  5. HTN: Check BP outside of the doctor's office.  If systolic above 194, let us know.  She did not take her meds today.  It is expensive now while she does not have insurance, we can switch her to a generic.  Changed to Irbesartan 300 mg daily.  Started amlodipine 5 mg daily. Stressed importance of taking her meds.  May need to increase amlodipine from 5 mg to 10 mg, if BP high at home. Call us with some BP readings from the drugstore.    Signed, Mina Marble, MD, [facility] 04/14/2014 8:41 AM

## 2014-04-14 NOTE — Patient Instructions (Signed)
Your physician recommends that you continue on your current medications as directed. Please refer to the Current Medication list given to you today.  Your physician has requested that you regularly monitor and record your blood pressure readings at home/local drug store. Please call our office with those readings.   Your physician wants you to follow-up in: 1 year with Dr. Irish Lack.  You will receive a reminder letter in the mail two months in advance. If you don't receive a letter, please call our office to schedule the follow-up appointment.

## 2014-04-16 ENCOUNTER — Ambulatory Visit (INDEPENDENT_AMBULATORY_CARE_PROVIDER_SITE_OTHER): Payer: Self-pay | Admitting: *Deleted

## 2014-04-16 ENCOUNTER — Encounter: Payer: Self-pay | Admitting: Internal Medicine

## 2014-04-16 ENCOUNTER — Ambulatory Visit (INDEPENDENT_AMBULATORY_CARE_PROVIDER_SITE_OTHER): Payer: Self-pay | Admitting: Internal Medicine

## 2014-04-16 VITALS — BP 144/84 | HR 80 | Ht 67.0 in | Wt 225.4 lb

## 2014-04-16 DIAGNOSIS — I481 Persistent atrial fibrillation: Secondary | ICD-10-CM

## 2014-04-16 DIAGNOSIS — I059 Rheumatic mitral valve disease, unspecified: Secondary | ICD-10-CM

## 2014-04-16 DIAGNOSIS — I10 Essential (primary) hypertension: Secondary | ICD-10-CM

## 2014-04-16 DIAGNOSIS — I4819 Other persistent atrial fibrillation: Secondary | ICD-10-CM

## 2014-04-16 DIAGNOSIS — I447 Left bundle-branch block, unspecified: Secondary | ICD-10-CM

## 2014-04-16 DIAGNOSIS — I519 Heart disease, unspecified: Secondary | ICD-10-CM

## 2014-04-16 DIAGNOSIS — Z5181 Encounter for therapeutic drug level monitoring: Secondary | ICD-10-CM

## 2014-04-16 LAB — HEPATIC FUNCTION PANEL
ALT: 18 U/L (ref 0–35)
AST: 25 U/L (ref 0–37)
Albumin: 3.8 g/dL (ref 3.5–5.2)
Alkaline Phosphatase: 135 U/L — ABNORMAL HIGH (ref 39–117)
BILIRUBIN DIRECT: 0.2 mg/dL (ref 0.0–0.3)
Total Bilirubin: 0.9 mg/dL (ref 0.2–1.2)
Total Protein: 7.8 g/dL (ref 6.0–8.3)

## 2014-04-16 LAB — POCT INR: INR: 3.3

## 2014-04-16 NOTE — Progress Notes (Signed)
Electrophysiology Office Note   Date:  04/16/2014   ID:  [name], [name] 1955/01/11, MRN [id number]  PCP:  [name],[name], PA-C  Cardiologist:  Dr Irish Lack Primary Electrophysiologist: Thompson Grayer, MD    Chief Complaint  Patient presents with  . Atrial Fibrillation  . Atrial Flutter     History of Present Illness: [name] is a 59 y.o. female who presents today for electrophysiology evaluation.   She is doing reasonably well.  She has stable NYHA Class II/III CHF.  She reports dypsnea with moderate activity.  She does not think that she has had any afib since I saw her last.  Her INRs have been very labile. Today, she denies symptoms of palpitations, chest pain, shortness of breath at rest, orthopnea, PND, lower extremity edema, claudication, dizziness, presyncope, syncope, bleeding, or neurologic sequela. The patient is tolerating medications without difficulties and is otherwise without complaint today.    Past Medical History  Diagnosis Date  . Asthma   . Hypertension   . Obesity   . Chronic diastolic congestive heart failure   . Carotid bruit   . Hypercholesterolemia   . Polyp of rectum   . Abnormal chest CT   . Persistent atrial fibrillation   . Bronchospasm 1998  . Atrial enlargement, left     severe  . Heart murmur   . Sleep apnea     DOES NOT HAVE CPAP  . Diabetes mellitus     insulin dependent  . Mitral regurgitation   . Tricuspid regurgitation 01/16/2012  . Rheumatic fever 01/16/2012    Reported during childhood  . Atrial fibrillation 01/05/2011    Chronic persistent, failed DCCV   . Obesity (BMI 30-39.9) 01/16/2012  . Shortness of breath     with exertion  . History of blood transfusion   . S/P mitral valve replacement with metallic valve 4/0/8144    69mm Sorin Carbomedics Optiform mechanical prosthesis  . 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 S/P Maze operation for atrial fibrillation 02/15/2012    Complete  biatrial lesion set using bipolar radiofrequency and cryothermy ablation with clipping of LA appendage  . Atrial flutter    Past Surgical History  Procedure Laterality Date  . Cardiovascular stress test  09/2010  . Cardiac catheterization  >5 years  . Cardioversion  03/25/2011    Procedure: CARDIOVERSION;  Surgeon: Jettie Booze, MD;  Location: [facility];  Service: Cardiovascular;  Laterality: N/A;  . Tee without cardioversion  12/21/2011    Procedure: TRANSESOPHAGEAL ECHOCARDIOGRAM (TEE);  Surgeon: Jettie Booze, MD;  Location: [facility];  Service: Cardiovascular;  Laterality: N/A;  . No past surgeries    . Tricuspid valve replacement  02/15/2012    Procedure: TRICUSPID VALVE REPAIR;  Surgeon: Rexene Alberts, MD;  Location: [facility];  Service: Open Heart Surgery;  Laterality: N/A;  . Maze  02/15/2012    Procedure: MAZE;  Surgeon: Rexene Alberts, MD;  Location: [facility];  Service: Open Heart Surgery;  Laterality: N/A;  . Intraoperative transesophageal echocardiogram  02/15/2012    Procedure: INTRAOPERATIVE TRANSESOPHAGEAL ECHOCARDIOGRAM;  Surgeon: Rexene Alberts, MD;  Location: [facility];  Service: Open Heart Surgery;  Laterality: N/A;  . Mitral valve replacement  02/15/2012    Procedure: MITRAL VALVE (MV) REPLACEMENT;  Surgeon: Rexene Alberts, MD;  Location: [facility];  Service: Open Heart Surgery;  Laterality: N/A;  . Cardioversion N/A 07/27/2012    Procedure:  CARDIOVERSION;  Surgeon: Jettie Booze, MD;  Location: [facility];  Service: Cardiovascular;  Laterality: N/A;     Current Outpatient Prescriptions  Medication Sig Dispense Refill  . albuterol (PROVENTIL HFA;VENTOLIN HFA) 108 (90 BASE) MCG/ACT inhaler Inhale 2 puffs into the lungs every 6 (six) hours as needed. For wheezing    . amiodarone (PACERONE) 200 MG tablet TAKE 1 TABLET (200 MG TOTAL) BY MOUTH 2 (TWO) TIMES DAILY. 60 tablet 0  . amLODipine (NORVASC) 5 MG tablet Take 1 tablet (5 mg total) by mouth daily. 90 tablet 3  .  atorvastatin (LIPITOR) 40 MG tablet TAKE 1 TABLET BY MOUTH EVERY DAY 30 tablet 5  . calcium carbonate (OS-CAL - DOSED IN MG OF ELEMENTAL CALCIUM) 1250 MG tablet Take 1 tablet by mouth daily.      . carvedilol (COREG) 6.25 MG tablet Take 1 tablet (6.25 mg total) by mouth 2 (two) times daily with a meal. 60 tablet 11  . Cholecalciferol (VITAMIN D) 1000 UNITS capsule Take 1,000 Units by mouth daily.     . cyclobenzaprine (FLEXERIL) 10 MG tablet Take 1 tablet by mouth daily as needed. Muscle spasms    . furosemide (LASIX) 40 MG tablet Take 1 tablet (40 mg total) by mouth 2 (two) times daily. 180 tablet 1  . insulin glargine (LANTUS SOLOSTAR) 100 UNIT/ML injection Inject 30-35 Units into the skin See admin instructions. Inject 30 units once daily and inject 35 units daily at bedtime.    . irbesartan (AVAPRO) 300 MG tablet Take 1 tablet (300 mg total) by mouth daily. 90 tablet 3  . metFORMIN (GLUCOPHAGE) 500 MG tablet Take 500 mg by mouth 2 (two) times daily with a meal.     . methocarbamol (ROBAXIN) 500 MG tablet Take 500 mg by mouth daily as needed for muscle spasms.     . metoprolol tartrate (LOPRESSOR) 25 MG tablet Take 25 mg by mouth daily.  6  . Omega-3 Fatty Acids (FISH OIL) 1000 MG CAPS Take 1 capsule by mouth daily.     . ONGLYZA 5 MG TABS tablet Take 5 mg by mouth daily.     . potassium chloride SA (K-DUR,KLOR-CON) 20 MEQ tablet Take 1 tablet (20 mEq total) by mouth daily. 30 tablet 6  . warfarin (COUMADIN) 4 MG tablet Take as directed by coumadin clinic 70 tablet 3  . [DISCONTINUED] flecainide (TAMBOCOR) 100 MG tablet Take 1 tablet (100 mg total) by mouth every 12 (twelve) hours. 60 tablet 11   No current facility-administered medications for this visit.    Allergies:   Review of patient's allergies indicates no known allergies.   Social History:  The patient  reports that she has never smoked. She has never used smokeless tobacco. She reports that she does not drink alcohol or use illicit  drugs.   Family History:  The patient's  family history includes Asthma in her mother; Diabetes in her mother.    ROS:  Please see the history of present illness.   All other systems are reviewed and negative.    PHYSICAL EXAM: VS:  BP 144/84 mmHg  Pulse 80  Ht 5\' 7"  (1.702 m)  Wt 225 lb 6.4 oz (102.241 kg)  BMI 35.29 kg/m2 , BMI Body mass index is 35.29 kg/(m^2). GEN: Well nourished, well developed, in no acute distress HEENT: normal Neck: no JVD, carotid bruits, or masses Cardiac: RRR; mechanical S1 Respiratory:  clear to auscultation bilaterally, normal work of breathing GI: soft, nontender, nondistended, +  BS MS: no deformity or atrophy Skin: warm and dry  Neuro:  Strength and sensation are intact Psych: euthymic mood, full affect  EKG:  EKG is ordered today. The ekg ordered today shows sinus rhythm with LBBB (QRS 174 msec)  Echo 2015 is reviewed--> EF 35-40%   Recent Labs: 05/02/2013: BUN 16; Creatinine 0.8; Potassium 3.5; Sodium 136 10/07/2013: ALT 21    Lipid Panel     Component Value Date/Time   CHOL 169 04/15/2012 0530   TRIG 78 04/15/2012 0530   HDL 27* 04/15/2012 0530   CHOLHDL 6.3 04/15/2012 0530   VLDL 16 04/15/2012 0530   LDLCALC 126* 04/15/2012 0530     Wt Readings from Last 3 Encounters:  04/16/14 225 lb 6.4 oz (102.241 kg)  04/14/14 223 lb (101.152 kg)  03/03/14 224 lb (101.606 kg)      Other studies Reviewed: Additional studies/ records that were reviewed today include: Dr Ricard Dillon and Dr Hassell Done notes    ASSESSMENT AND PLAN:  1.  Persistent afib Maintaining sinus rhythm amio level last year was low suggesting she may benefit from 200mg  BID long term. Nevertheless, she has not had afib in quite some time.  I would therefore favor reducing to 200mg  daily today and following clinically Check lfts/tfst today  2. Nonischemic CM/ LBBB euvolemic today, NYHA Class II/III CHF She does not meet criteria for ICD but may benefit from CRT-P  given QRS >150 msec and CHF symptoms Risks, benefits, alternatives to biventricular pacemaker implantation were discussed in detail with the patient today. The patient understands that the risks include but are not limited to bleeding, infection, pneumothorax, perforation, tamponade, vascular damage, renal failure, MI, stroke, death,  and lead dislodgement and wishes to avoid any additional procedures at this time.  She may be willing to reconsider in the future. No changes are made today  3. Overweight She would benefit from weigh reduction  4. HTN Stable No change required today  Current medicines are reviewed at length with the patient today.   The patient does not have concerns regarding her medicines.  The following changes were made today:  none   Follow-up with Drs Irish Lack and [name] as scheduled.  I will see again in 1 year   Signed, Thompson Grayer, MD  04/16/2014 9:12 AM     [facility] [address] [place] [address] [phone] (office) [phone] (fax)

## 2014-04-16 NOTE — Patient Instructions (Signed)
Your physician has recommended you make the following change in your medication:  1) Decrease amiodarone to 200 mg one tablet by mouth once daily.  Your physician recommends that you have lab work today: liver/ tsh/ t4  Your physician wants you to follow-up in: 1 year with Dr. Rayann Heman. You will receive a reminder letter in the mail two months in advance. If you don't receive a letter, please call our office to schedule the follow-up appointment.

## 2014-04-17 LAB — T4: T4, Total: 11 ug/dL (ref 4.5–12.0)

## 2014-04-29 ENCOUNTER — Telehealth: Payer: Self-pay | Admitting: Interventional Cardiology

## 2014-04-29 NOTE — Telephone Encounter (Signed)
Walk-In pt Form "STD claim Form" dropped off sent to [facility].

## 2014-05-05 ENCOUNTER — Ambulatory Visit (INDEPENDENT_AMBULATORY_CARE_PROVIDER_SITE_OTHER): Payer: Self-pay | Admitting: *Deleted

## 2014-05-05 DIAGNOSIS — Z5181 Encounter for therapeutic drug level monitoring: Secondary | ICD-10-CM

## 2014-05-05 DIAGNOSIS — I059 Rheumatic mitral valve disease, unspecified: Secondary | ICD-10-CM

## 2014-05-05 LAB — POCT INR: INR: 4.4

## 2014-05-19 ENCOUNTER — Ambulatory Visit (INDEPENDENT_AMBULATORY_CARE_PROVIDER_SITE_OTHER): Payer: No Typology Code available for payment source

## 2014-05-19 DIAGNOSIS — Z5181 Encounter for therapeutic drug level monitoring: Secondary | ICD-10-CM

## 2014-05-19 DIAGNOSIS — I059 Rheumatic mitral valve disease, unspecified: Secondary | ICD-10-CM

## 2014-05-19 LAB — POCT INR: INR: 3.2

## 2014-05-22 ENCOUNTER — Other Ambulatory Visit: Payer: Self-pay | Admitting: Interventional Cardiology

## 2014-06-16 ENCOUNTER — Ambulatory Visit (INDEPENDENT_AMBULATORY_CARE_PROVIDER_SITE_OTHER): Payer: No Typology Code available for payment source | Admitting: *Deleted

## 2014-06-16 DIAGNOSIS — Z5181 Encounter for therapeutic drug level monitoring: Secondary | ICD-10-CM | POA: Diagnosis not present

## 2014-06-16 DIAGNOSIS — I059 Rheumatic mitral valve disease, unspecified: Secondary | ICD-10-CM | POA: Diagnosis not present

## 2014-06-16 LAB — POCT INR: INR: 4.2

## 2014-06-30 ENCOUNTER — Ambulatory Visit (INDEPENDENT_AMBULATORY_CARE_PROVIDER_SITE_OTHER): Payer: No Typology Code available for payment source | Admitting: *Deleted

## 2014-06-30 DIAGNOSIS — I059 Rheumatic mitral valve disease, unspecified: Secondary | ICD-10-CM | POA: Diagnosis not present

## 2014-06-30 DIAGNOSIS — Z5181 Encounter for therapeutic drug level monitoring: Secondary | ICD-10-CM

## 2014-06-30 LAB — POCT INR: INR: 4.5

## 2014-07-03 ENCOUNTER — Other Ambulatory Visit: Payer: Self-pay | Admitting: Interventional Cardiology

## 2014-07-16 ENCOUNTER — Ambulatory Visit (INDEPENDENT_AMBULATORY_CARE_PROVIDER_SITE_OTHER): Payer: No Typology Code available for payment source | Admitting: *Deleted

## 2014-07-16 DIAGNOSIS — I059 Rheumatic mitral valve disease, unspecified: Secondary | ICD-10-CM

## 2014-07-16 DIAGNOSIS — Z5181 Encounter for therapeutic drug level monitoring: Secondary | ICD-10-CM

## 2014-07-16 LAB — POCT INR: INR: 4.3

## 2014-07-29 ENCOUNTER — Ambulatory Visit (INDEPENDENT_AMBULATORY_CARE_PROVIDER_SITE_OTHER): Payer: No Typology Code available for payment source | Admitting: *Deleted

## 2014-07-29 ENCOUNTER — Other Ambulatory Visit: Payer: Self-pay | Admitting: Interventional Cardiology

## 2014-07-29 DIAGNOSIS — Z5181 Encounter for therapeutic drug level monitoring: Secondary | ICD-10-CM | POA: Diagnosis not present

## 2014-07-29 DIAGNOSIS — I059 Rheumatic mitral valve disease, unspecified: Secondary | ICD-10-CM | POA: Diagnosis not present

## 2014-07-29 LAB — POCT INR: INR: 2.9

## 2014-08-18 ENCOUNTER — Ambulatory Visit (INDEPENDENT_AMBULATORY_CARE_PROVIDER_SITE_OTHER): Payer: No Typology Code available for payment source | Admitting: *Deleted

## 2014-08-18 DIAGNOSIS — I059 Rheumatic mitral valve disease, unspecified: Secondary | ICD-10-CM | POA: Diagnosis not present

## 2014-08-18 DIAGNOSIS — Z5181 Encounter for therapeutic drug level monitoring: Secondary | ICD-10-CM

## 2014-08-18 LAB — POCT INR: INR: 2.7

## 2014-08-28 ENCOUNTER — Other Ambulatory Visit: Payer: Self-pay | Admitting: Interventional Cardiology

## 2014-09-17 ENCOUNTER — Ambulatory Visit (INDEPENDENT_AMBULATORY_CARE_PROVIDER_SITE_OTHER): Payer: No Typology Code available for payment source | Admitting: *Deleted

## 2014-09-17 DIAGNOSIS — I059 Rheumatic mitral valve disease, unspecified: Secondary | ICD-10-CM | POA: Diagnosis not present

## 2014-09-17 DIAGNOSIS — Z5181 Encounter for therapeutic drug level monitoring: Secondary | ICD-10-CM

## 2014-09-17 LAB — POCT INR: INR: 3.9

## 2014-10-14 ENCOUNTER — Ambulatory Visit (INDEPENDENT_AMBULATORY_CARE_PROVIDER_SITE_OTHER): Payer: No Typology Code available for payment source | Admitting: *Deleted

## 2014-10-14 DIAGNOSIS — I059 Rheumatic mitral valve disease, unspecified: Secondary | ICD-10-CM | POA: Diagnosis not present

## 2014-10-14 DIAGNOSIS — Z5181 Encounter for therapeutic drug level monitoring: Secondary | ICD-10-CM | POA: Diagnosis not present

## 2014-10-14 LAB — POCT INR: INR: 2

## 2014-10-27 ENCOUNTER — Ambulatory Visit (INDEPENDENT_AMBULATORY_CARE_PROVIDER_SITE_OTHER): Payer: No Typology Code available for payment source | Admitting: *Deleted

## 2014-10-27 DIAGNOSIS — Z5181 Encounter for therapeutic drug level monitoring: Secondary | ICD-10-CM

## 2014-10-27 DIAGNOSIS — I059 Rheumatic mitral valve disease, unspecified: Secondary | ICD-10-CM

## 2014-10-27 LAB — POCT INR: INR: 3.3

## 2014-11-17 ENCOUNTER — Ambulatory Visit (INDEPENDENT_AMBULATORY_CARE_PROVIDER_SITE_OTHER): Payer: No Typology Code available for payment source | Admitting: *Deleted

## 2014-11-17 DIAGNOSIS — I059 Rheumatic mitral valve disease, unspecified: Secondary | ICD-10-CM | POA: Diagnosis not present

## 2014-11-17 DIAGNOSIS — Z5181 Encounter for therapeutic drug level monitoring: Secondary | ICD-10-CM | POA: Diagnosis not present

## 2014-11-17 LAB — POCT INR: INR: 2.9

## 2014-12-15 ENCOUNTER — Other Ambulatory Visit: Payer: Self-pay | Admitting: Interventional Cardiology

## 2014-12-15 ENCOUNTER — Other Ambulatory Visit: Payer: Self-pay | Admitting: Internal Medicine

## 2014-12-16 ENCOUNTER — Ambulatory Visit (INDEPENDENT_AMBULATORY_CARE_PROVIDER_SITE_OTHER): Payer: No Typology Code available for payment source | Admitting: *Deleted

## 2014-12-16 DIAGNOSIS — Z5181 Encounter for therapeutic drug level monitoring: Secondary | ICD-10-CM

## 2014-12-16 DIAGNOSIS — I059 Rheumatic mitral valve disease, unspecified: Secondary | ICD-10-CM

## 2014-12-16 LAB — POCT INR: INR: 1.7

## 2014-12-30 ENCOUNTER — Ambulatory Visit (INDEPENDENT_AMBULATORY_CARE_PROVIDER_SITE_OTHER): Payer: Self-pay | Admitting: *Deleted

## 2014-12-30 DIAGNOSIS — Z5181 Encounter for therapeutic drug level monitoring: Secondary | ICD-10-CM

## 2014-12-30 DIAGNOSIS — I059 Rheumatic mitral valve disease, unspecified: Secondary | ICD-10-CM

## 2014-12-30 LAB — POCT INR: INR: 3.3

## 2015-01-26 ENCOUNTER — Ambulatory Visit (INDEPENDENT_AMBULATORY_CARE_PROVIDER_SITE_OTHER): Payer: BLUE CROSS/BLUE SHIELD

## 2015-01-26 DIAGNOSIS — I059 Rheumatic mitral valve disease, unspecified: Secondary | ICD-10-CM

## 2015-01-26 DIAGNOSIS — Z5181 Encounter for therapeutic drug level monitoring: Secondary | ICD-10-CM | POA: Diagnosis not present

## 2015-01-26 LAB — POCT INR: INR: 2.9

## 2015-02-23 ENCOUNTER — Ambulatory Visit (INDEPENDENT_AMBULATORY_CARE_PROVIDER_SITE_OTHER): Payer: BLUE CROSS/BLUE SHIELD | Admitting: *Deleted

## 2015-02-23 DIAGNOSIS — I059 Rheumatic mitral valve disease, unspecified: Secondary | ICD-10-CM

## 2015-02-23 DIAGNOSIS — Z5181 Encounter for therapeutic drug level monitoring: Secondary | ICD-10-CM

## 2015-02-23 LAB — POCT INR: INR: 2.6

## 2015-03-31 ENCOUNTER — Other Ambulatory Visit: Payer: Self-pay | Admitting: *Deleted

## 2015-03-31 MED ORDER — ATORVASTATIN CALCIUM 40 MG PO TABS: 40.0000 mg | ORAL_TABLET | Freq: Every day | ORAL | Status: DC

## 2015-04-06 ENCOUNTER — Encounter: Payer: Self-pay | Admitting: Internal Medicine

## 2015-04-06 ENCOUNTER — Ambulatory Visit (INDEPENDENT_AMBULATORY_CARE_PROVIDER_SITE_OTHER): Payer: BLUE CROSS/BLUE SHIELD | Admitting: *Deleted

## 2015-04-06 DIAGNOSIS — I059 Rheumatic mitral valve disease, unspecified: Secondary | ICD-10-CM | POA: Diagnosis not present

## 2015-04-06 DIAGNOSIS — Z5181 Encounter for therapeutic drug level monitoring: Secondary | ICD-10-CM

## 2015-04-06 LAB — POCT INR: INR: 2.8

## 2015-05-10 ENCOUNTER — Other Ambulatory Visit: Payer: Self-pay | Admitting: Interventional Cardiology

## 2015-05-13 ENCOUNTER — Encounter: Payer: Self-pay | Admitting: Internal Medicine

## 2015-05-13 ENCOUNTER — Ambulatory Visit (INDEPENDENT_AMBULATORY_CARE_PROVIDER_SITE_OTHER): Payer: BLUE CROSS/BLUE SHIELD | Admitting: Internal Medicine

## 2015-05-13 ENCOUNTER — Ambulatory Visit (INDEPENDENT_AMBULATORY_CARE_PROVIDER_SITE_OTHER): Payer: BLUE CROSS/BLUE SHIELD | Admitting: Pharmacist

## 2015-05-13 VITALS — BP 166/100 | HR 71 | Ht 67.0 in | Wt 142.4 lb

## 2015-05-13 DIAGNOSIS — Z954 Presence of other heart-valve replacement: Secondary | ICD-10-CM

## 2015-05-13 DIAGNOSIS — I10 Essential (primary) hypertension: Secondary | ICD-10-CM

## 2015-05-13 DIAGNOSIS — Z5181 Encounter for therapeutic drug level monitoring: Secondary | ICD-10-CM

## 2015-05-13 DIAGNOSIS — R0602 Shortness of breath: Secondary | ICD-10-CM

## 2015-05-13 DIAGNOSIS — I4819 Other persistent atrial fibrillation: Secondary | ICD-10-CM

## 2015-05-13 DIAGNOSIS — I481 Persistent atrial fibrillation: Secondary | ICD-10-CM | POA: Diagnosis not present

## 2015-05-13 DIAGNOSIS — I519 Heart disease, unspecified: Secondary | ICD-10-CM

## 2015-05-13 DIAGNOSIS — I059 Rheumatic mitral valve disease, unspecified: Secondary | ICD-10-CM | POA: Diagnosis not present

## 2015-05-13 LAB — POCT INR: INR: 3.2

## 2015-05-13 NOTE — Patient Instructions (Signed)
Medication Instructions:  Your physician recommends that you continue on your current medications as directed. Please refer to the Current Medication list given to you today.   Labwork: Your physician recommends that you return for lab work same day as echo fasting   Testing/Procedures: Your physician has requested that you have an echocardiogram. Echocardiography is a painless test that uses sound waves to create images of your heart. It provides your doctor with information about the size and shape of your heart and how well your heart's chambers and valves are working. This procedure takes approximately one hour. There are no restrictions for this procedure.    Follow-Up: Your physician wants you to follow-up in: 12 months with Dr Vallery Ridge will receive a reminder letter in the mail two months in advance. If you don't receive a letter, please call our office to schedule the follow-up appointment.   Any Other Special Instructions Will Be Listed Below (If Applicable).     If you need a refill on your cardiac medications before your next appointment, please call your pharmacy.

## 2015-05-13 NOTE — Progress Notes (Signed)
Electrophysiology Office Note   Date:  05/13/2015   ID:  Teena, Lorden [birth year], MRN [id number]  PCP:  [name],[name], PA-C  Cardiologist:  Dr Irish Lack Primary Electrophysiologist: Thompson Grayer, MD    Chief Complaint  Patient presents with  . Atrial Fibrillation     History of Present Illness: [name] is a 60 y.o. female who presents today for electrophysiology evaluation.   She is doing reasonably well.  She has stable NYHA Class II/III CHF.  She reports dypsnea with moderate activity.  She does not think that she has had any afib since I saw her last.  She is not very active by preference. Today, she denies symptoms of palpitations, chest pain, shortness of breath at rest, orthopnea, PND, lower extremity edema, claudication, dizziness, presyncope, syncope, bleeding, or neurologic sequela. The patient is tolerating medications without difficulties and is otherwise without complaint today.    Past Medical History  Diagnosis Date  . Asthma   . Hypertension   . Obesity   . Chronic diastolic congestive heart failure ([place])   . Carotid bruit   . Hypercholesterolemia   . Polyp of rectum   . Abnormal chest CT   . Persistent atrial fibrillation ([place])   . Bronchospasm 1998  . Atrial enlargement, left     severe  . Heart murmur   . Sleep apnea     DOES NOT HAVE CPAP  . Diabetes mellitus     insulin dependent  . Mitral regurgitation   . Tricuspid regurgitation 01/16/2012  . Rheumatic fever 01/16/2012    Reported during childhood  . Atrial fibrillation ([place]) 01/05/2011    Chronic persistent, failed DCCV   . Obesity (BMI 30-39.9) 01/16/2012  . Shortness of breath     with exertion  . History of blood transfusion   . S/P mitral valve replacement with metallic valve [id number]    57mm Sorin Carbomedics Optiform mechanical prosthesis  . 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 S/P Maze operation for atrial fibrillation 02/15/2012    Complete  biatrial lesion set using bipolar radiofrequency and cryothermy ablation with clipping of LA appendage  . Atrial flutter [facility])    Past Surgical History  Procedure Laterality Date  . Cardiovascular stress test  09/2010  . Cardiac catheterization  >5 years  . Cardioversion  03/25/2011    Procedure: CARDIOVERSION;  Surgeon: Jettie Booze, MD;  Location: [place];  Service: Cardiovascular;  Laterality: N/A;  . Tee without cardioversion  12/21/2011    Procedure: TRANSESOPHAGEAL ECHOCARDIOGRAM (TEE);  Surgeon: Jettie Booze, MD;  Location: [place];  Service: Cardiovascular;  Laterality: N/A;  . No past surgeries    . Tricuspid valve replacement  02/15/2012    Procedure: TRICUSPID VALVE REPAIR;  Surgeon: Rexene Alberts, MD;  Location: [place];  Service: Open Heart Surgery;  Laterality: N/A;  . Maze  02/15/2012    Procedure: MAZE;  Surgeon: Rexene Alberts, MD;  Location: [place];  Service: Open Heart Surgery;  Laterality: N/A;  . Intraoperative transesophageal echocardiogram  02/15/2012    Procedure: INTRAOPERATIVE TRANSESOPHAGEAL ECHOCARDIOGRAM;  Surgeon: Rexene Alberts, MD;  Location: [place];  Service: Open Heart Surgery;  Laterality: N/A;  . Mitral valve replacement  02/15/2012    Procedure: MITRAL VALVE (MV) REPLACEMENT;  Surgeon: Rexene Alberts, MD;  Location: [place];  Service: Open Heart Surgery;  Laterality: N/A;  . Cardioversion N/A 07/27/2012  Procedure: CARDIOVERSION;  Surgeon: Jettie Booze, MD;  Location: [facility] ENDOSCOPY;  Service: Cardiovascular;  Laterality: N/A;     Current Outpatient Prescriptions  Medication Sig Dispense Refill  . albuterol (PROVENTIL HFA;VENTOLIN HFA) 108 (90 BASE) MCG/ACT inhaler Inhale 2 puffs into the lungs every 6 (six) hours as needed. For wheezing    . amiodarone (PACERONE) 200 MG tablet Take 1 tablet (200 mg total) by mouth daily.    [name] amLODipine (NORVASC) 5 MG tablet TAKE 1 TABLET BY MOUTH DAILY 90 tablet 2  . atorvastatin (LIPITOR) 40 MG  tablet Take 1 tablet (40 mg total) by mouth daily. 30 tablet 1  . calcium carbonate (OS-CAL - DOSED IN MG OF ELEMENTAL CALCIUM) 1250 MG tablet Take 1 tablet by mouth daily.      . carvedilol (COREG) 6.25 MG tablet TAKE 1 TABLET BY MOUTH TWICE DAILY WITH A MEAL 60 tablet 3  . Cholecalciferol (VITAMIN D) 1000 UNITS capsule Take 1,000 Units by mouth daily.     . cyclobenzaprine (FLEXERIL) 10 MG tablet Take 1 tablet by mouth daily as needed. Muscle spasms    . furosemide (LASIX) 40 MG tablet Take 1 tablet (40 mg total) by mouth 2 (two) times daily. 180 tablet 1  . insulin glargine (LANTUS SOLOSTAR) 100 UNIT/ML injection Inject 30-35 Units into the skin See admin instructions. Inject 30 units once daily and inject 35 units daily at bedtime.    . irbesartan (AVAPRO) 300 MG tablet Take 1 tablet (300 mg total) by mouth daily. 90 tablet 3  . metFORMIN (GLUCOPHAGE) 500 MG tablet Take 500 mg by mouth 2 (two) times daily with a meal.     . methocarbamol (ROBAXIN) 500 MG tablet Take 500 mg by mouth daily as needed for muscle spasms.     . metoprolol tartrate (LOPRESSOR) 25 MG tablet TAKE 1 TABLET BY MOUTH TWICE DAILY 60 tablet 3  . Omega-3 Fatty Acids (FISH OIL) 1000 MG CAPS Take 1 capsule by mouth daily.     . ONGLYZA 5 MG TABS tablet Take 5 mg by mouth daily.     . potassium chloride SA (K-DUR,KLOR-CON) 20 MEQ tablet Take 1 tablet (20 mEq total) by mouth daily. 30 tablet 6  . warfarin (COUMADIN) 4 MG tablet Take 1.5-2 tablets (6-8 mg total) by mouth daily. As directed 60 tablet 3  . [DISCONTINUED] flecainide (TAMBOCOR) 100 MG tablet Take 1 tablet (100 mg total) by mouth every 12 (twelve) hours. 60 tablet 11   No current facility-administered medications for this visit.    Allergies:   Review of patient's allergies indicates no known allergies.   Social History:  The patient  reports that she has never smoked. She has never used smokeless tobacco. She reports that she does not drink alcohol or use illicit  drugs.   Family History:  The patient's  family history includes Asthma in her mother; Diabetes in her mother.    ROS:  Please see the history of present illness.   All other systems are reviewed and negative.    PHYSICAL EXAM: VS:  BP 166/100 mmHg  Pulse 71  Ht 5\' 7"  (1.702 m)  Wt 142 lb 6.4 oz (64.592 kg)  BMI 22.30 kg/m2 , BMI Body mass index is 22.3 kg/(m^2). GEN: Well nourished, well developed, in no acute distress HEENT: normal Neck: no JVD, carotid bruits, or masses Cardiac: RRR; mechanical S1, diastolic murmur LSB Respiratory:  clear to auscultation bilaterally, normal work of breathing GI: soft, nontender, nondistended, +  BS MS: no deformity or atrophy Skin: warm and dry  Neuro:  Strength and sensation are intact Psych: euthymic mood, full affect  EKG:  EKG is ordered today. The ekg ordered today shows sinus rhythm with LBBB (QRS 166 msec)  Echo 2015 is reviewed--> EF 35-40%   Recent Labs: No results found for requested labs within last 365 days.    Lipid Panel     Component Value Date/Time   CHOL 169 04/15/2012 0530   TRIG 78 04/15/2012 0530   HDL 27* 04/15/2012 0530   CHOLHDL 6.3 04/15/2012 0530   VLDL 16 04/15/2012 0530   LDLCALC 126* 04/15/2012 0530     Wt Readings from Last 3 Encounters:  05/13/15 142 lb 6.4 oz (64.592 kg)  04/16/14 225 lb 6.4 oz (102.241 kg)  04/14/14 223 lb (101.152 kg)      ASSESSMENT AND PLAN:  1.  Persistent afib Maintaining sinus rhythm despite severe LA enlargement Would favor continuing amiodarone 200mg  daily Check lfts/tfts today  2. Nonischemic CM/ LBBB euvolemic today, NYHA Class II/III CHF Repeat echo If EF <35%, would strongly favor BiV ICD.  She is very hesitant event to this discussion.   Would benefit from additional medicine titration upon follow-up with Dr Irish Lack No changes are made today  3. Overweight She would benefit from weigh reduction  4. HTN Elevated today She has not taken her AM  meds I have encouraged compliance She has a BP cuff but does not use it.  She says she doesn't know how.  I have instructed her to bring it with her when she sees Dr Irish Lack so that nurses can educate her.  5. HL Check fasting lipids  6. Mechanical MVR Continue to follow in the coumadin clinic Overdue to see Dr [name].  Current medicines are reviewed at length with the patient today.   The patient does not have concerns regarding her medicines.  The following changes were made today:  none   Follow-up with Drs Irish Lack as scheduled.  I will see again in 1 year   Signed, Thompson Grayer, MD  05/13/2015 10:06 AM     [facility] [address] [place] [address] [phone] (office) [phone] (fax)

## 2015-05-25 ENCOUNTER — Encounter: Payer: Self-pay | Admitting: *Deleted

## 2015-05-28 ENCOUNTER — Ambulatory Visit (HOSPITAL_COMMUNITY): Payer: BLUE CROSS/BLUE SHIELD | Attending: Cardiology

## 2015-05-28 ENCOUNTER — Other Ambulatory Visit: Payer: Self-pay

## 2015-05-28 ENCOUNTER — Other Ambulatory Visit (INDEPENDENT_AMBULATORY_CARE_PROVIDER_SITE_OTHER): Payer: BLUE CROSS/BLUE SHIELD | Admitting: *Deleted

## 2015-05-28 DIAGNOSIS — I480 Paroxysmal atrial fibrillation: Secondary | ICD-10-CM | POA: Diagnosis not present

## 2015-05-28 DIAGNOSIS — I255 Ischemic cardiomyopathy: Secondary | ICD-10-CM | POA: Diagnosis not present

## 2015-05-28 DIAGNOSIS — I509 Heart failure, unspecified: Secondary | ICD-10-CM | POA: Diagnosis not present

## 2015-05-28 DIAGNOSIS — E785 Hyperlipidemia, unspecified: Secondary | ICD-10-CM | POA: Insufficient documentation

## 2015-05-28 DIAGNOSIS — I2510 Atherosclerotic heart disease of native coronary artery without angina pectoris: Secondary | ICD-10-CM | POA: Diagnosis not present

## 2015-05-28 DIAGNOSIS — R0602 Shortness of breath: Secondary | ICD-10-CM | POA: Insufficient documentation

## 2015-05-28 DIAGNOSIS — I447 Left bundle-branch block, unspecified: Secondary | ICD-10-CM | POA: Diagnosis not present

## 2015-05-28 DIAGNOSIS — E119 Type 2 diabetes mellitus without complications: Secondary | ICD-10-CM | POA: Diagnosis not present

## 2015-05-28 DIAGNOSIS — I110 Hypertensive heart disease with heart failure: Secondary | ICD-10-CM | POA: Insufficient documentation

## 2015-05-28 DIAGNOSIS — J45909 Unspecified asthma, uncomplicated: Secondary | ICD-10-CM | POA: Insufficient documentation

## 2015-05-28 DIAGNOSIS — Z952 Presence of prosthetic heart valve: Secondary | ICD-10-CM | POA: Insufficient documentation

## 2015-05-28 DIAGNOSIS — Z8673 Personal history of transient ischemic attack (TIA), and cerebral infarction without residual deficits: Secondary | ICD-10-CM | POA: Insufficient documentation

## 2015-05-28 DIAGNOSIS — I082 Rheumatic disorders of both aortic and tricuspid valves: Secondary | ICD-10-CM | POA: Insufficient documentation

## 2015-05-28 DIAGNOSIS — I10 Essential (primary) hypertension: Secondary | ICD-10-CM | POA: Diagnosis not present

## 2015-05-28 DIAGNOSIS — I4891 Unspecified atrial fibrillation: Secondary | ICD-10-CM | POA: Diagnosis not present

## 2015-05-28 DIAGNOSIS — E7800 Pure hypercholesterolemia, unspecified: Secondary | ICD-10-CM

## 2015-05-28 LAB — CBC WITH DIFFERENTIAL/PLATELET
BASOS PCT: 0 %
Basophils Absolute: 0 cells/uL (ref 0–200)
EOS PCT: 3 %
Eosinophils Absolute: 144 cells/uL (ref 15–500)
HCT: 40.7 % (ref 35.0–45.0)
Hemoglobin: 13 g/dL (ref 11.7–15.5)
LYMPHS PCT: 30 %
Lymphs Abs: 1440 cells/uL (ref 850–3900)
MCH: 30 pg (ref 27.0–33.0)
MCHC: 31.9 g/dL — ABNORMAL LOW (ref 32.0–36.0)
MCV: 93.8 fL (ref 80.0–100.0)
MONOS PCT: 8 %
MPV: 11.3 fL (ref 7.5–12.5)
Monocytes Absolute: 384 cells/uL (ref 200–950)
NEUTROS ABS: 2832 {cells}/uL (ref 1500–7800)
Neutrophils Relative %: 59 %
PLATELETS: 144 10*3/uL (ref 140–400)
RBC: 4.34 MIL/uL (ref 3.80–5.10)
RDW: 13 % (ref 11.0–15.0)
WBC: 4.8 10*3/uL (ref 3.8–10.8)

## 2015-05-28 LAB — BASIC METABOLIC PANEL
BUN: 9 mg/dL (ref 7–25)
CHLORIDE: 100 mmol/L (ref 98–110)
CO2: 28 mmol/L (ref 20–31)
Calcium: 8.7 mg/dL (ref 8.6–10.4)
Creat: 0.67 mg/dL (ref 0.50–0.99)
Glucose, Bld: 237 mg/dL — ABNORMAL HIGH (ref 65–99)
POTASSIUM: 4.2 mmol/L (ref 3.5–5.3)
SODIUM: 137 mmol/L (ref 135–146)

## 2015-05-28 LAB — LIPID PANEL
CHOL/HDL RATIO: 4.3 ratio (ref <=5.0)
Cholesterol: 152 mg/dL (ref 125–200)
HDL: 35 mg/dL — AB (ref >=46)
LDL CALC: 95 mg/dL (ref <130)
TRIGLYCERIDES: 108 mg/dL (ref <150)
VLDL: 22 mg/dL (ref <30)

## 2015-05-28 LAB — HEPATIC FUNCTION PANEL
ALBUMIN: 3.9 g/dL (ref 3.6–5.1)
ALT: 20 U/L (ref 6–29)
AST: 26 U/L (ref 10–35)
Alkaline Phosphatase: 114 U/L (ref 33–130)
Bilirubin, Direct: 0.2 mg/dL (ref <=0.2)
Indirect Bilirubin: 0.6 mg/dL (ref 0.2–1.2)
TOTAL PROTEIN: 7.5 g/dL (ref 6.1–8.1)
Total Bilirubin: 0.8 mg/dL (ref 0.2–1.2)

## 2015-05-28 LAB — T4, FREE: Free T4: 1.2 ng/dL (ref 0.8–1.8)

## 2015-05-28 LAB — T3, FREE: T3 FREE: 3.1 pg/mL (ref 2.3–4.2)

## 2015-05-28 LAB — TSH: TSH: 1.52 mIU/L

## 2015-05-28 NOTE — Progress Notes (Signed)
Patient ID: [name], female   DOB: [birth year], 60 y.o.   MRN: [id number]     Cardiology Office Note   Date:  05/29/2015   ID:  [name], DOB Jul 28, 1955, MRN [id number]  PCP:  [name],[name], PA-C    No chief complaint on file. f/u mitral valve repair   Wt Readings from Last 3 Encounters:  05/29/15 223 lb (101.152 kg)  05/13/15 142 lb 6.4 oz (64.592 kg)  04/16/14 225 lb 6.4 oz (102.241 kg)       History of Present Illness: [name] is a 60 y.o. female  who had a mitral valve repair in 2014. This was complicated by a post op CVA. She has had atrial flutter. Dr. Rayann Heman recommended amio followed by cardioversion. She had the cardioversion a few months ago in 07/2012. She is out of work. She is on disability at this point. She has some trouble going up stairs, and walking some distance even on flat ground.  Atrial Fibrillation F/U:  avoids lifting some days. c/o Leg edema- intermittent ( some good days).  Still has burning in the chest at the scar. Denies : Chest pain.  Dizziness.  Orthopnea.  Palpitations.  Syncope.   She is on disability. She has been trying to walk more, but a few months ago, she fell.  She has been more careful.  She tripped at that time.  Coumadin has been well regulated.  No blood in the stool.      Past Medical History  Diagnosis Date  . Asthma   . Hypertension   . Obesity   . Chronic diastolic congestive heart failure ([place])   . Carotid bruit   . Hypercholesterolemia   . Polyp of rectum   . Abnormal chest CT   . Persistent atrial fibrillation ([place])   . Bronchospasm 1998  . Atrial enlargement, left     severe  . Heart murmur   . Sleep apnea     DOES NOT HAVE CPAP  . Diabetes mellitus     insulin dependent  . Mitral regurgitation   . Tricuspid regurgitation 01/16/2012  . Rheumatic fever 01/16/2012    Reported during childhood  . Atrial fibrillation ([place]) 01/05/2011    Chronic persistent, failed DCCV   . Obesity (BMI 30-39.9)  01/16/2012  . Shortness of breath     with exertion  . History of blood transfusion   . S/P mitral valve replacement with metallic valve [id number]    48mm Sorin Carbomedics Optiform mechanical prosthesis  . 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    38mm Edwards mc3 ring annuloplasty  . S/P Maze operation for atrial fibrillation 02/15/2012    Complete biatrial lesion set using bipolar radiofrequency and cryothermy ablation with clipping of LA appendage  . Atrial flutter [facility])     Past Surgical History  Procedure Laterality Date  . Cardiovascular stress test  09/2010  . Cardiac catheterization  >5 years  . Cardioversion  03/25/2011    Procedure: CARDIOVERSION;  Surgeon: Jettie Booze, MD;  Location: [place];  Service: Cardiovascular;  Laterality: N/A;  . Tee without cardioversion  12/21/2011    Procedure: TRANSESOPHAGEAL ECHOCARDIOGRAM (TEE);  Surgeon: Jettie Booze, MD;  Location: [place];  Service: Cardiovascular;  Laterality: N/A;  . No past surgeries    . Tricuspid valve replacement  02/15/2012    Procedure: TRICUSPID VALVE REPAIR;  Surgeon: Rexene Alberts, MD;  Location: [place];  Service: Open Heart Surgery;  Laterality: N/A;  .  Maze  02/15/2012    Procedure: MAZE;  Surgeon: Rexene Alberts, MD;  Location: [place];  Service: Open Heart Surgery;  Laterality: N/A;  . Intraoperative transesophageal echocardiogram  02/15/2012    Procedure: INTRAOPERATIVE TRANSESOPHAGEAL ECHOCARDIOGRAM;  Surgeon: Rexene Alberts, MD;  Location: [place];  Service: Open Heart Surgery;  Laterality: N/A;  . Mitral valve replacement  02/15/2012    Procedure: MITRAL VALVE (MV) REPLACEMENT;  Surgeon: Rexene Alberts, MD;  Location: [place];  Service: Open Heart Surgery;  Laterality: N/A;  . Cardioversion N/A 07/27/2012    Procedure: CARDIOVERSION;  Surgeon: Jettie Booze, MD;  Location: [facility] ENDOSCOPY;  Service: Cardiovascular;  Laterality: N/A;     Current Outpatient Prescriptions  Medication Sig Dispense  Refill  . albuterol (PROVENTIL HFA;VENTOLIN HFA) 108 (90 BASE) MCG/ACT inhaler Inhale 2 puffs into the lungs every 6 (six) hours as needed. For wheezing    . amiodarone (PACERONE) 200 MG tablet Take 1 tablet (200 mg total) by mouth daily.    [name] amLODipine (NORVASC) 5 MG tablet TAKE 1 TABLET BY MOUTH DAILY 90 tablet 2  . atorvastatin (LIPITOR) 40 MG tablet Take 1 tablet (40 mg total) by mouth daily. 30 tablet 1  . calcium carbonate (OS-CAL - DOSED IN MG OF ELEMENTAL CALCIUM) 1250 MG tablet Take 1 tablet by mouth daily.      . carvedilol (COREG) 6.25 MG tablet TAKE 1 TABLET BY MOUTH TWICE DAILY WITH A MEAL 60 tablet 3  . Cholecalciferol (VITAMIN D) 1000 UNITS capsule Take 1,000 Units by mouth daily.     . cyclobenzaprine (FLEXERIL) 10 MG tablet Take 1 tablet by mouth daily as needed. Muscle spasms    . furosemide (LASIX) 40 MG tablet Take 1 tablet (40 mg total) by mouth 2 (two) times daily. 180 tablet 1  . insulin glargine (LANTUS SOLOSTAR) 100 UNIT/ML injection Inject 30-35 Units into the skin See admin instructions. Inject 30 units once daily and inject 35 units daily at bedtime.    . irbesartan (AVAPRO) 300 MG tablet Take 1 tablet (300 mg total) by mouth daily. 90 tablet 3  . metFORMIN (GLUCOPHAGE) 500 MG tablet Take 500 mg by mouth 2 (two) times daily with a meal.     . methocarbamol (ROBAXIN) 500 MG tablet Take 500 mg by mouth daily as needed for muscle spasms.     . metoprolol tartrate (LOPRESSOR) 25 MG tablet TAKE 1 TABLET BY MOUTH TWICE DAILY 60 tablet 3  . Omega-3 Fatty Acids (FISH OIL) 1000 MG CAPS Take 1 capsule by mouth daily.     . ONGLYZA 5 MG TABS tablet Take 5 mg by mouth daily.     . potassium chloride SA (K-DUR,KLOR-CON) 20 MEQ tablet Take 1 tablet (20 mEq total) by mouth daily. 30 tablet 6  . warfarin (COUMADIN) 4 MG tablet Take 1.5-2 tablets (6-8 mg total) by mouth daily. As directed 60 tablet 3  . [DISCONTINUED] flecainide (TAMBOCOR) 100 MG tablet Take 1 tablet (100 mg total) by  mouth every 12 (twelve) hours. 60 tablet 11   No current facility-administered medications for this visit.    Allergies:   Review of patient's allergies indicates no known allergies.    Social History:  The patient  reports that she has never smoked. She has never used smokeless tobacco. She reports that she does not drink alcohol or use illicit drugs.   Family History:  The patient's family history includes Asthma in her mother; Diabetes in her mother;  Hypertension in her brother. There is no history of Heart attack or Stroke.    ROS:  Please see the history of present illness.   Otherwise, review of systems are positive for chest burning at the scar; DOE.   All other systems are reviewed and negative.    PHYSICAL EXAM: VS:  BP 130/78 mmHg  Pulse 74  Ht 5\' 7"  (1.702 m)  Wt 223 lb (101.152 kg)  BMI 34.92 kg/m2 , BMI Body mass index is 34.92 kg/(m^2). GEN: Well nourished, well developed, in no acute distress HEENT: normal Neck: no JVD, carotid bruits, or masses Cardiac: Crisp S1 clkick, RRR; 2/6 systolic murmur, no rubs, or gallops,no edema  Respiratory:  clear to auscultation bilaterally, normal work of breathing GI: soft, nontender, nondistended, + BS MS: no deformity or atrophy Skin: warm and dry, no rash Neuro:  Strength and sensation are intact Psych: euthymic mood, full affect   EKG:   The ekg ordered in early May demonstrates NSR, LBBB   Recent Labs: 05/28/2015: ALT 20; BUN 9; Creat 0.67; Hemoglobin 13.0; Platelets 144; Potassium 4.2; Sodium 137; TSH 1.52   Lipid Panel    Component Value Date/Time   CHOL 152 05/28/2015 0958   TRIG 108 05/28/2015 0958   HDL 35* 05/28/2015 0958   CHOLHDL 4.3 05/28/2015 0958   VLDL 22 05/28/2015 0958   LDLCALC 95 05/28/2015 0958     Other studies Reviewed: Additional studies/ records that were reviewed today with results demonstrating: labs and echo from yesterday.   ASSESSMENT AND PLAN:  1. Mitral valve disorders: needs  SBE prophylaxis. Mechanical mitral valve placed in February 2014.  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 s/p CVA post operatively. EF decreased post op, but 35-40% in May 2017. Appears euvolemic at this time. No signs of heart failure. 2. Atrial flutter:  Sucessful cardioversion in 2015. Coumadin for stroke prevention. Amiodarone to maintain NSR. Tolerating once a day Amiodarone.  3. HTN: Still not Checking BP outside of the doctor's office. If systolic above [id number], let us know.Changed to Irbesartan 300 mg daily. Continue amlodipine 5 mg daily. Stressed importance of taking her meds. May need to increase amlodipine from 5 mg to 10 mg, if BP high at home. Call us with some BP readings from the drugstore. 4. SHOB: Continue Lasix Tablet, 40 MG, 1 tablet, Orally, BID; Still taking it at night despite my prior instructions. Move second dose to after lunch.  I stressed the importance of trying to exercies regularly to increase her stamina.  Notes: Some fluid overload at times. Check BP at home. She has not been checking. Known LV systolic dysfunction. Stressed importance of BP control to help forward blood flow. She does not have a blood pressure cuff at home. She can check at a local drug store.   Current medicines are reviewed at length with the patient today.  The patient concerns regarding her medicines were addressed.  The following changes have been made:  Stop metoprolol.  Increase carvedilol.   Labs/ tests ordered today include:  No orders of the defined types were placed in this encounter.    Recommend 150 minutes/week of aerobic exercise Low fat, low carb, high fiber diet recommended  Disposition:   FU in 1 year   Signed, Larae Grooms, MD  05/29/2015 8:03 AM    [facility] [place], [place], [place]  [address] Phone: [phone]; Fax: [phone]

## 2015-05-28 NOTE — Addendum Note (Signed)
Addended by: Eulis Foster on: 05/28/2015 08:38 AM   Modules accepted: Orders

## 2015-05-28 NOTE — Addendum Note (Signed)
Addended by: Eulis Foster on: 05/28/2015 08:28 AM   Modules accepted: Orders

## 2015-05-29 ENCOUNTER — Ambulatory Visit (INDEPENDENT_AMBULATORY_CARE_PROVIDER_SITE_OTHER): Payer: BLUE CROSS/BLUE SHIELD | Admitting: Interventional Cardiology

## 2015-05-29 ENCOUNTER — Encounter: Payer: Self-pay | Admitting: Interventional Cardiology

## 2015-05-29 VITALS — BP 130/78 | HR 74 | Ht 67.0 in | Wt 223.0 lb

## 2015-05-29 DIAGNOSIS — Z9889 Other specified postprocedural states: Secondary | ICD-10-CM

## 2015-05-29 DIAGNOSIS — Z954 Presence of other heart-valve replacement: Secondary | ICD-10-CM | POA: Diagnosis not present

## 2015-05-29 DIAGNOSIS — I480 Paroxysmal atrial fibrillation: Secondary | ICD-10-CM

## 2015-05-29 DIAGNOSIS — I10 Essential (primary) hypertension: Secondary | ICD-10-CM

## 2015-05-29 DIAGNOSIS — I481 Persistent atrial fibrillation: Secondary | ICD-10-CM

## 2015-05-29 DIAGNOSIS — I4819 Other persistent atrial fibrillation: Secondary | ICD-10-CM

## 2015-05-29 DIAGNOSIS — Z8679 Personal history of other diseases of the circulatory system: Secondary | ICD-10-CM

## 2015-05-29 MED ORDER — ATORVASTATIN CALCIUM 40 MG PO TABS: 40.0000 mg | ORAL_TABLET | Freq: Every day | ORAL | Status: DC

## 2015-05-29 MED ORDER — FUROSEMIDE 40 MG PO TABS: 40.0000 mg | ORAL_TABLET | Freq: Two times a day (BID) | ORAL | Status: DC

## 2015-05-29 MED ORDER — POTASSIUM CHLORIDE CRYS ER 20 MEQ PO TBCR: 20.0000 meq | EXTENDED_RELEASE_TABLET | Freq: Every day | ORAL | Status: DC

## 2015-05-29 MED ORDER — CARVEDILOL 12.5 MG PO TABS: ORAL_TABLET | ORAL | Status: DC

## 2015-05-29 MED ORDER — IRBESARTAN 300 MG PO TABS: 300.0000 mg | ORAL_TABLET | Freq: Every day | ORAL | Status: DC

## 2015-05-29 MED ORDER — AMLODIPINE BESYLATE 5 MG PO TABS: 5.0000 mg | ORAL_TABLET | Freq: Every day | ORAL | Status: DC

## 2015-05-29 MED ORDER — AMIODARONE HCL 200 MG PO TABS: 200.0000 mg | ORAL_TABLET | Freq: Every day | ORAL | Status: DC

## 2015-05-29 NOTE — Patient Instructions (Addendum)
**Note De-Identified General Wearing Obfuscation** Medication Instructions:  Stop taking Metoprolol and increase Carvedilol to 12.5 mg twice daily. Start taking Furosemide 1st tablet in the morning and the second tablet after lunch -all other medications remain the same.  Labwork: None  Testing/Procedures: None  Follow-Up: Your physician wants you to follow-up in: 1 year. You will receive a reminder letter in the mail two months in advance. If you don't receive a letter, please call our office to schedule the follow-up appointment.     If you need a refill on your cardiac medications before your next appointment, please call your pharmacy.

## 2015-06-10 ENCOUNTER — Ambulatory Visit (INDEPENDENT_AMBULATORY_CARE_PROVIDER_SITE_OTHER): Payer: BLUE CROSS/BLUE SHIELD | Admitting: *Deleted

## 2015-06-10 DIAGNOSIS — Z5181 Encounter for therapeutic drug level monitoring: Secondary | ICD-10-CM | POA: Diagnosis not present

## 2015-06-10 DIAGNOSIS — I059 Rheumatic mitral valve disease, unspecified: Secondary | ICD-10-CM

## 2015-06-10 LAB — POCT INR: INR: 3.9

## 2015-07-01 ENCOUNTER — Ambulatory Visit (INDEPENDENT_AMBULATORY_CARE_PROVIDER_SITE_OTHER): Payer: BLUE CROSS/BLUE SHIELD | Admitting: *Deleted

## 2015-07-01 DIAGNOSIS — Z5181 Encounter for therapeutic drug level monitoring: Secondary | ICD-10-CM

## 2015-07-01 DIAGNOSIS — I059 Rheumatic mitral valve disease, unspecified: Secondary | ICD-10-CM | POA: Diagnosis not present

## 2015-07-01 LAB — POCT INR: INR: 4.3

## 2015-07-20 ENCOUNTER — Ambulatory Visit (INDEPENDENT_AMBULATORY_CARE_PROVIDER_SITE_OTHER): Payer: BLUE CROSS/BLUE SHIELD | Admitting: Pharmacist

## 2015-07-20 DIAGNOSIS — Z5181 Encounter for therapeutic drug level monitoring: Secondary | ICD-10-CM | POA: Diagnosis not present

## 2015-07-20 DIAGNOSIS — I059 Rheumatic mitral valve disease, unspecified: Secondary | ICD-10-CM | POA: Diagnosis not present

## 2015-07-20 LAB — POCT INR: INR: 4

## 2015-08-10 ENCOUNTER — Encounter (INDEPENDENT_AMBULATORY_CARE_PROVIDER_SITE_OTHER): Payer: Self-pay

## 2015-08-10 ENCOUNTER — Ambulatory Visit (INDEPENDENT_AMBULATORY_CARE_PROVIDER_SITE_OTHER): Payer: BLUE CROSS/BLUE SHIELD | Admitting: *Deleted

## 2015-08-10 DIAGNOSIS — I059 Rheumatic mitral valve disease, unspecified: Secondary | ICD-10-CM

## 2015-08-10 DIAGNOSIS — Z5181 Encounter for therapeutic drug level monitoring: Secondary | ICD-10-CM | POA: Diagnosis not present

## 2015-08-10 LAB — POCT INR: INR: 3.9

## 2015-08-24 ENCOUNTER — Ambulatory Visit (INDEPENDENT_AMBULATORY_CARE_PROVIDER_SITE_OTHER): Payer: BLUE CROSS/BLUE SHIELD | Admitting: *Deleted

## 2015-08-24 DIAGNOSIS — I059 Rheumatic mitral valve disease, unspecified: Secondary | ICD-10-CM | POA: Diagnosis not present

## 2015-08-24 DIAGNOSIS — Z5181 Encounter for therapeutic drug level monitoring: Secondary | ICD-10-CM

## 2015-08-24 LAB — POCT INR: INR: 2.3

## 2015-09-03 ENCOUNTER — Other Ambulatory Visit: Payer: Self-pay | Admitting: Physician Assistant

## 2015-09-03 ENCOUNTER — Other Ambulatory Visit (HOSPITAL_COMMUNITY)
Admission: RE | Admit: 2015-09-03 | Discharge: 2015-09-03 | Disposition: A | Discharge status: Home / Self Care | Payer: BLUE CROSS/BLUE SHIELD | Source: Ambulatory Visit | Attending: Physician Assistant | Admitting: Physician Assistant

## 2015-09-03 DIAGNOSIS — Z1151 Encounter for screening for human papillomavirus (HPV): Secondary | ICD-10-CM | POA: Insufficient documentation

## 2015-09-03 DIAGNOSIS — Z01411 Encounter for gynecological examination (general) (routine) with abnormal findings: Secondary | ICD-10-CM | POA: Diagnosis present

## 2015-09-07 LAB — CYTOLOGY - PAP

## 2015-09-08 ENCOUNTER — Ambulatory Visit (INDEPENDENT_AMBULATORY_CARE_PROVIDER_SITE_OTHER): Payer: BLUE CROSS/BLUE SHIELD | Admitting: *Deleted

## 2015-09-08 ENCOUNTER — Encounter (INDEPENDENT_AMBULATORY_CARE_PROVIDER_SITE_OTHER): Payer: Self-pay

## 2015-09-08 DIAGNOSIS — I059 Rheumatic mitral valve disease, unspecified: Secondary | ICD-10-CM

## 2015-09-08 DIAGNOSIS — Z5181 Encounter for therapeutic drug level monitoring: Secondary | ICD-10-CM | POA: Diagnosis not present

## 2015-09-08 LAB — POCT INR: INR: 2.8

## 2015-09-11 ENCOUNTER — Other Ambulatory Visit: Payer: Self-pay | Admitting: Physician Assistant

## 2015-09-11 DIAGNOSIS — Z1231 Encounter for screening mammogram for malignant neoplasm of breast: Secondary | ICD-10-CM

## 2015-09-18 ENCOUNTER — Ambulatory Visit
Admission: RE | Admit: 2015-09-18 | Discharge: 2015-09-18 | Disposition: A | Discharge status: Home / Self Care | Payer: BLUE CROSS/BLUE SHIELD | Source: Ambulatory Visit | Attending: Physician Assistant | Admitting: Physician Assistant

## 2015-09-18 DIAGNOSIS — Z1231 Encounter for screening mammogram for malignant neoplasm of breast: Secondary | ICD-10-CM

## 2015-09-21 ENCOUNTER — Other Ambulatory Visit: Payer: Self-pay | Admitting: Interventional Cardiology

## 2015-09-23 ENCOUNTER — Other Ambulatory Visit: Payer: Self-pay | Admitting: Interventional Cardiology

## 2015-09-29 ENCOUNTER — Ambulatory Visit (INDEPENDENT_AMBULATORY_CARE_PROVIDER_SITE_OTHER): Payer: BLUE CROSS/BLUE SHIELD | Admitting: *Deleted

## 2015-09-29 DIAGNOSIS — I059 Rheumatic mitral valve disease, unspecified: Secondary | ICD-10-CM

## 2015-09-29 DIAGNOSIS — Z5181 Encounter for therapeutic drug level monitoring: Secondary | ICD-10-CM | POA: Diagnosis not present

## 2015-09-29 LAB — POCT INR: INR: 1.5

## 2015-09-30 ENCOUNTER — Other Ambulatory Visit: Payer: Self-pay | Admitting: Interventional Cardiology

## 2015-10-06 ENCOUNTER — Ambulatory Visit (INDEPENDENT_AMBULATORY_CARE_PROVIDER_SITE_OTHER): Payer: BLUE CROSS/BLUE SHIELD | Admitting: *Deleted

## 2015-10-06 DIAGNOSIS — Z5181 Encounter for therapeutic drug level monitoring: Secondary | ICD-10-CM

## 2015-10-06 DIAGNOSIS — I059 Rheumatic mitral valve disease, unspecified: Secondary | ICD-10-CM

## 2015-10-06 LAB — POCT INR: INR: 2.3

## 2015-10-19 ENCOUNTER — Ambulatory Visit (INDEPENDENT_AMBULATORY_CARE_PROVIDER_SITE_OTHER): Payer: BLUE CROSS/BLUE SHIELD | Admitting: *Deleted

## 2015-10-19 DIAGNOSIS — I059 Rheumatic mitral valve disease, unspecified: Secondary | ICD-10-CM | POA: Diagnosis not present

## 2015-10-19 DIAGNOSIS — Z5181 Encounter for therapeutic drug level monitoring: Secondary | ICD-10-CM

## 2015-10-19 LAB — POCT INR: INR: 3.2

## 2015-11-09 ENCOUNTER — Ambulatory Visit (INDEPENDENT_AMBULATORY_CARE_PROVIDER_SITE_OTHER): Payer: BLUE CROSS/BLUE SHIELD

## 2015-11-09 ENCOUNTER — Encounter (INDEPENDENT_AMBULATORY_CARE_PROVIDER_SITE_OTHER): Payer: Self-pay

## 2015-11-09 DIAGNOSIS — I059 Rheumatic mitral valve disease, unspecified: Secondary | ICD-10-CM

## 2015-11-09 DIAGNOSIS — Z5181 Encounter for therapeutic drug level monitoring: Secondary | ICD-10-CM

## 2015-11-09 LAB — POCT INR: INR: 3.3

## 2015-11-25 ENCOUNTER — Other Ambulatory Visit: Payer: Self-pay | Admitting: Interventional Cardiology

## 2015-12-07 ENCOUNTER — Ambulatory Visit (INDEPENDENT_AMBULATORY_CARE_PROVIDER_SITE_OTHER): Payer: BLUE CROSS/BLUE SHIELD | Admitting: *Deleted

## 2015-12-07 DIAGNOSIS — Z5181 Encounter for therapeutic drug level monitoring: Secondary | ICD-10-CM | POA: Diagnosis not present

## 2015-12-07 DIAGNOSIS — I059 Rheumatic mitral valve disease, unspecified: Secondary | ICD-10-CM | POA: Diagnosis not present

## 2015-12-07 LAB — POCT INR: INR: 2.5

## 2016-01-12 ENCOUNTER — Ambulatory Visit (INDEPENDENT_AMBULATORY_CARE_PROVIDER_SITE_OTHER): Payer: Medicare Other | Admitting: *Deleted

## 2016-01-12 DIAGNOSIS — I059 Rheumatic mitral valve disease, unspecified: Secondary | ICD-10-CM

## 2016-01-12 DIAGNOSIS — Z5181 Encounter for therapeutic drug level monitoring: Secondary | ICD-10-CM

## 2016-01-12 LAB — POCT INR: INR: 4

## 2016-01-26 ENCOUNTER — Ambulatory Visit (INDEPENDENT_AMBULATORY_CARE_PROVIDER_SITE_OTHER): Payer: Medicare Other | Admitting: *Deleted

## 2016-01-26 DIAGNOSIS — Z5181 Encounter for therapeutic drug level monitoring: Secondary | ICD-10-CM

## 2016-01-26 DIAGNOSIS — I059 Rheumatic mitral valve disease, unspecified: Secondary | ICD-10-CM | POA: Diagnosis not present

## 2016-01-26 LAB — POCT INR: INR: 5

## 2016-02-02 ENCOUNTER — Ambulatory Visit (INDEPENDENT_AMBULATORY_CARE_PROVIDER_SITE_OTHER): Payer: Medicare Other | Admitting: *Deleted

## 2016-02-02 DIAGNOSIS — I059 Rheumatic mitral valve disease, unspecified: Secondary | ICD-10-CM

## 2016-02-02 DIAGNOSIS — Z5181 Encounter for therapeutic drug level monitoring: Secondary | ICD-10-CM

## 2016-02-02 LAB — POCT INR: INR: 3.9

## 2016-02-12 ENCOUNTER — Ambulatory Visit (INDEPENDENT_AMBULATORY_CARE_PROVIDER_SITE_OTHER): Payer: Medicare Other | Admitting: *Deleted

## 2016-02-12 DIAGNOSIS — I059 Rheumatic mitral valve disease, unspecified: Secondary | ICD-10-CM

## 2016-02-12 DIAGNOSIS — Z5181 Encounter for therapeutic drug level monitoring: Secondary | ICD-10-CM | POA: Diagnosis not present

## 2016-02-12 LAB — POCT INR: INR: 3.7

## 2016-03-01 ENCOUNTER — Ambulatory Visit (INDEPENDENT_AMBULATORY_CARE_PROVIDER_SITE_OTHER): Payer: Medicare Other | Admitting: *Deleted

## 2016-03-01 DIAGNOSIS — Z5181 Encounter for therapeutic drug level monitoring: Secondary | ICD-10-CM | POA: Diagnosis not present

## 2016-03-01 DIAGNOSIS — I059 Rheumatic mitral valve disease, unspecified: Secondary | ICD-10-CM | POA: Diagnosis not present

## 2016-03-01 LAB — POCT INR: INR: 2.7

## 2016-03-16 DIAGNOSIS — E1122 Type 2 diabetes mellitus with diabetic chronic kidney disease: Secondary | ICD-10-CM | POA: Diagnosis not present

## 2016-03-16 DIAGNOSIS — Z952 Presence of prosthetic heart valve: Secondary | ICD-10-CM | POA: Diagnosis not present

## 2016-03-16 DIAGNOSIS — I110 Hypertensive heart disease with heart failure: Secondary | ICD-10-CM | POA: Diagnosis not present

## 2016-03-16 DIAGNOSIS — E7800 Pure hypercholesterolemia, unspecified: Secondary | ICD-10-CM | POA: Diagnosis not present

## 2016-03-16 DIAGNOSIS — R809 Proteinuria, unspecified: Secondary | ICD-10-CM | POA: Diagnosis not present

## 2016-03-16 DIAGNOSIS — I509 Heart failure, unspecified: Secondary | ICD-10-CM | POA: Diagnosis not present

## 2016-03-16 DIAGNOSIS — I4891 Unspecified atrial fibrillation: Secondary | ICD-10-CM | POA: Diagnosis not present

## 2016-03-25 ENCOUNTER — Ambulatory Visit (INDEPENDENT_AMBULATORY_CARE_PROVIDER_SITE_OTHER): Payer: Medicare Other | Admitting: *Deleted

## 2016-03-25 ENCOUNTER — Encounter (INDEPENDENT_AMBULATORY_CARE_PROVIDER_SITE_OTHER): Payer: Self-pay

## 2016-03-25 ENCOUNTER — Telehealth: Payer: Self-pay | Admitting: Pharmacist

## 2016-03-25 DIAGNOSIS — I059 Rheumatic mitral valve disease, unspecified: Secondary | ICD-10-CM

## 2016-03-25 DIAGNOSIS — Z5181 Encounter for therapeutic drug level monitoring: Secondary | ICD-10-CM

## 2016-03-25 LAB — POCT INR: INR: 2.1

## 2016-03-25 NOTE — Telephone Encounter (Signed)
Received fax from Dr Pamalee [facility] that pt is scheduled for a screening colonoscopy on 4/18 with request to hold Coumadin. Pt takes Coumadin for mechanical mitral valve replacement and afib with CHADS2 score of 5 (HTN, DM, CHF, and CVA). Pt will require Lovenox bridge prior to colonoscopy, will coordinate in Coumadin clinic. Clearance faxed to [phone].

## 2016-04-08 ENCOUNTER — Ambulatory Visit (INDEPENDENT_AMBULATORY_CARE_PROVIDER_SITE_OTHER): Payer: Medicare Other | Admitting: Pharmacist

## 2016-04-08 DIAGNOSIS — I059 Rheumatic mitral valve disease, unspecified: Secondary | ICD-10-CM | POA: Diagnosis not present

## 2016-04-08 DIAGNOSIS — Z5181 Encounter for therapeutic drug level monitoring: Secondary | ICD-10-CM

## 2016-04-08 LAB — POCT INR: INR: 2.1

## 2016-04-14 ENCOUNTER — Other Ambulatory Visit: Payer: Self-pay | Admitting: Interventional Cardiology

## 2016-04-22 ENCOUNTER — Other Ambulatory Visit: Payer: Self-pay | Admitting: Gastroenterology

## 2016-04-22 ENCOUNTER — Ambulatory Visit (INDEPENDENT_AMBULATORY_CARE_PROVIDER_SITE_OTHER): Payer: Medicare Other | Admitting: *Deleted

## 2016-04-22 DIAGNOSIS — I059 Rheumatic mitral valve disease, unspecified: Secondary | ICD-10-CM

## 2016-04-22 DIAGNOSIS — Z5181 Encounter for therapeutic drug level monitoring: Secondary | ICD-10-CM | POA: Diagnosis not present

## 2016-04-22 LAB — POCT INR: INR: 2

## 2016-04-22 MED ORDER — ENOXAPARIN SODIUM 100 MG/ML ~~LOC~~ SOLN: 100.0000 mg | Freq: Two times a day (BID) | SUBCUTANEOUS | 0 refills | Status: DC

## 2016-04-22 NOTE — Patient Instructions (Signed)
04/21/16- Last dose of Coumadin.  04/22/16- No Coumadin or Lovenox shots.   04/23/16- Inject Lovenox 100mg  in the fatty abdominal tissue at least 2 inches from the belly button twice a day about 12 hours apart, 6am and 6pm rotate sites. No Coumadin.  04/24/16- Inject Lovenox in the fatty tissue every 12 hours, 6am and 6pm. No Coumadin.  04/25/16- Inject Lovenox in the fatty tissue every 12 hours, 6am and 6pm. No Coumadin.  04/26/16- Inject Lovenox in the fatty tissue in the morning at 6 am (No 6pm shot). No Coumadin.  4/18/18Procedure Day - No Lovenox - Resume Coumadin in the evening or as directed by doctor ( 2 pills).  04/28/16- Resume Lovenox inject in the fatty tissue every 12 hours at 6am and 6pm and take Coumadin(2 pills)   04/29/16-Inject Lovenox in the fatty tissue every 12 hours and take Coumadin.( 2pills)  04/30/16- Inject Lovenox in the fatty tissue every 12 hours and take Coumadin.(1 1/2 pills)  05/01/16-Inject Lovenox in the fatty tissue every 12 hours and take Coumadin. (1 1/2 pills)  05/02/16- Inject Lovenox in the fatty tissue every 12 hours and take Coumadin. (2 pills)  05/03/16- Coumadin appt to check INR. Do a shot at 6am.

## 2016-04-26 NOTE — Anesthesia Preprocedure Evaluation (Addendum)
Anesthesia Evaluation  Patient identified by MRN, date of birth, ID band Patient awake    Reviewed: Allergy & Precautions, H&P , NPO status , Patient's Chart, lab work & pertinent test results  Airway Mallampati: III  TM Distance: >3 FB Neck ROM: Full    Dental no notable dental hx. (+) Teeth Intact, Dental Advisory Given   Pulmonary asthma , sleep apnea ,    Pulmonary exam normal breath sounds clear to auscultation       Cardiovascular Exercise Tolerance: Good hypertension, Pt. on medications and Pt. on home beta blockers +CHF  + dysrhythmias Atrial Fibrillation + Valvular Problems/Murmurs  Rhythm:Regular Rate:Normal     Neuro/Psych negative neurological ROS  negative psych ROS   GI/Hepatic negative GI ROS, Neg liver ROS,   Endo/Other  diabetes, Insulin Dependent, Oral Hypoglycemic Agents  Renal/GU negative Renal ROS  negative genitourinary   Musculoskeletal   Abdominal   Peds  Hematology negative hematology ROS (+)   Anesthesia Other Findings   Reproductive/Obstetrics negative OB ROS                            Anesthesia Physical Anesthesia Plan  ASA: III  Anesthesia Plan: MAC   Post-op Pain Management:    Induction: Intravenous  Airway Management Planned: Simple Face Mask  Additional Equipment:   Intra-op Plan:   Post-operative Plan:   Informed Consent: I have reviewed the patients History and Physical, chart, labs and discussed the procedure including the risks, benefits and alternatives for the proposed anesthesia with the patient or authorized representative who has indicated his/her understanding and acceptance.   Dental advisory given  Plan Discussed with: CRNA  Anesthesia Plan Comments:         Anesthesia Quick Evaluation

## 2016-04-27 ENCOUNTER — Encounter (HOSPITAL_COMMUNITY)
Admission: RE | Disposition: A | Discharge status: Home / Self Care | Payer: Self-pay | Source: Ambulatory Visit | Attending: Gastroenterology

## 2016-04-27 ENCOUNTER — Ambulatory Visit (HOSPITAL_COMMUNITY): Payer: Medicare Other | Admitting: Anesthesiology

## 2016-04-27 ENCOUNTER — Ambulatory Visit (HOSPITAL_COMMUNITY)
Admission: RE | Admit: 2016-04-27 | Discharge: 2016-04-27 | Disposition: A | Discharge status: Home / Self Care | Payer: Medicare Other | Source: Ambulatory Visit | Attending: Gastroenterology | Admitting: Gastroenterology

## 2016-04-27 ENCOUNTER — Encounter (HOSPITAL_COMMUNITY): Payer: Self-pay | Admitting: Certified Registered Nurse Anesthetist

## 2016-04-27 DIAGNOSIS — J45909 Unspecified asthma, uncomplicated: Secondary | ICD-10-CM | POA: Diagnosis not present

## 2016-04-27 DIAGNOSIS — E119 Type 2 diabetes mellitus without complications: Secondary | ICD-10-CM | POA: Insufficient documentation

## 2016-04-27 DIAGNOSIS — Z79899 Other long term (current) drug therapy: Secondary | ICD-10-CM | POA: Insufficient documentation

## 2016-04-27 DIAGNOSIS — I110 Hypertensive heart disease with heart failure: Secondary | ICD-10-CM | POA: Diagnosis not present

## 2016-04-27 DIAGNOSIS — I4891 Unspecified atrial fibrillation: Secondary | ICD-10-CM | POA: Insufficient documentation

## 2016-04-27 DIAGNOSIS — K640 First degree hemorrhoids: Secondary | ICD-10-CM | POA: Diagnosis not present

## 2016-04-27 DIAGNOSIS — Z1211 Encounter for screening for malignant neoplasm of colon: Secondary | ICD-10-CM | POA: Diagnosis not present

## 2016-04-27 DIAGNOSIS — Z794 Long term (current) use of insulin: Secondary | ICD-10-CM | POA: Diagnosis not present

## 2016-04-27 DIAGNOSIS — I509 Heart failure, unspecified: Secondary | ICD-10-CM | POA: Diagnosis not present

## 2016-04-27 DIAGNOSIS — I10 Essential (primary) hypertension: Secondary | ICD-10-CM | POA: Diagnosis not present

## 2016-04-27 HISTORY — PX: COLONOSCOPY WITH PROPOFOL: SHX5780

## 2016-04-27 LAB — GLUCOSE, CAPILLARY: GLUCOSE-CAPILLARY: 115 mg/dL — AB (ref 65–99)

## 2016-04-27 SURGERY — COLONOSCOPY WITH PROPOFOL
Anesthesia: Monitor Anesthesia Care

## 2016-04-27 MED ORDER — SODIUM CHLORIDE 0.9 % IV SOLN: INTRAVENOUS | Status: DC

## 2016-04-27 MED ORDER — LACTATED RINGERS IV SOLN
INTRAVENOUS | Status: DC
  Administered 2016-04-27 (×2): via INTRAVENOUS

## 2016-04-27 MED ORDER — PROPOFOL 500 MG/50ML IV EMUL
INTRAVENOUS | Status: DC | PRN
  Administered 2016-04-27: 100 ug/kg/min via INTRAVENOUS

## 2016-04-27 MED ORDER — LIDOCAINE 2% (20 MG/ML) 5 ML SYRINGE
INTRAMUSCULAR | Status: DC | PRN
  Administered 2016-04-27: 50 mg via INTRAVENOUS

## 2016-04-27 SURGICAL SUPPLY — 22 items

## 2016-04-27 NOTE — Transfer of Care (Signed)
Immediate Anesthesia Transfer of Care Note  Patient: [name]  Procedure(s) Performed: Procedure(s): COLONOSCOPY WITH PROPOFOL (N/A)  Patient Location: PACU and Endoscopy Unit  Anesthesia Type:MAC  Level of Consciousness: patient cooperative and responds to stimulation  Airway & Oxygen Therapy: Patient Spontanous Breathing and Patient connected to face mask oxygen  Post-op Assessment: Report given to RN and Post -op Vital signs reviewed and stable  Post vital signs: Reviewed and stable  Last Vitals:  Vitals:   04/27/16 0836 04/27/16 0940  BP: (!) 159/79 138/72  Pulse:  67  Resp: 16 (!) 30  Temp: 36.6 C 36.6 C    Last Pain:  Vitals:   04/27/16 0940  TempSrc: Oral         Complications: No apparent anesthesia complications

## 2016-04-27 NOTE — Op Note (Signed)
[facility] Patient Name: [name] Procedure Date : 04/27/2016 MRN: [id number] Attending MD: Lear Ng , MD Date of Birth: 10-20-1955 CSN: [id number] Age: 61 Admit Type: Outpatient Procedure:                Colonoscopy Indications:              Screening for colorectal malignant neoplasm, Last                            colonoscopy: October 2007 Providers:                Lear Ng, MD, Carmie End, RN,                            Corliss Parish, Technician Referring MD:              Medicines:                Propofol per Anesthesia, Monitored Anesthesia Care Complications:            No immediate complications. Estimated Blood Loss:     Estimated blood loss: none. Procedure:                Pre-Anesthesia Assessment:                           - Prior to the procedure, a History and Physical                            was performed, and patient medications and                            allergies were reviewed. The patient's tolerance of                            previous anesthesia was also reviewed. The risks                            and benefits of the procedure and the sedation                            options and risks were discussed with the patient.                            All questions were answered, and informed consent                            was obtained. Prior Anticoagulants: The patient has                            taken Coumadin (warfarin), last dose was 5 days                            prior to procedure. ASA Grade Assessment: III - A  patient with severe systemic disease. After                            reviewing the risks and benefits, the patient was                            deemed in satisfactory condition to undergo the                            procedure.                           After obtaining informed consent, the colonoscope                            was passed under direct  vision. Throughout the                            procedure, the patient's blood pressure, pulse, and                            oxygen saturations were monitored continuously. The                            EC-[id number] ([id number]) scope was introduced through                            the anus and advanced to the the cecum, identified                            by appendiceal orifice and ileocecal valve. The                            colonoscopy was performed with difficulty due to                            significant looping and a tortuous colon.                            Successful completion of the procedure was aided by                            straightening and shortening the scope to obtain                            bowel loop reduction and applying abdominal                            pressure. The patient tolerated the procedure                            fairly well. The quality of the bowel preparation  was fair. The ileocecal valve, appendiceal orifice,                            and rectum were photographed. Scope In: 9:17:48 AM Scope Out: 9:32:18 AM Scope Withdrawal Time: 0 hours 2 minutes 10 seconds  Total Procedure Duration: 0 hours 14 minutes 30 seconds  Findings:      The perianal and digital rectal examinations were normal.      Internal hemorrhoids were found during retroflexion. The hemorrhoids       were small and Grade I (internal hemorrhoids that do not prolapse).      The exam was otherwise normal throughout the examined colon. Impression:               - Preparation of the colon was fair.                           - Internal hemorrhoids.                           - No specimens collected. Moderate Sedation:      N/A - MAC procedure Recommendation:           - Patient has a contact number available for                            emergencies. The signs and symptoms of potential                            delayed complications were  discussed with the                            patient. Return to normal activities tomorrow.                            Written discharge instructions were provided to the                            patient.                           - Resume previous diet.                           - Resume Coumadin (warfarin) at prior dose today.                            Refer to managing physician for further adjustment                            of therapy.                           - Repeat colonoscopy in 10 years for screening                            purposes. Procedure Code(s):        --- Professional ---  45378, Colonoscopy, flexible; diagnostic, including                            collection of specimen(s) by brushing or washing,                            when performed (separate procedure) Diagnosis Code(s):        --- Professional ---                           Z12.11, Encounter for screening for malignant                            neoplasm of colon                           K64.0, First degree hemorrhoids CPT copyright 2016 [employer]. All rights reserved. The codes documented in this report are preliminary and upon coder review may  be revised to meet current compliance requirements. Lear Ng, MD 04/27/2016 9:48:59 AM This report has been signed electronically. Number of Addenda: 0

## 2016-04-27 NOTE — Discharge Instructions (Signed)

## 2016-04-27 NOTE — H&P (Signed)
Date of Initial H&P: 04/15/16  History reviewed, patient examined, no change in status, stable for surgery.

## 2016-04-27 NOTE — Anesthesia Postprocedure Evaluation (Signed)
Anesthesia Post Note  Patient: [name]  Procedure(s) Performed: Procedure(s) (LRB): COLONOSCOPY WITH PROPOFOL (N/A)  Patient location during evaluation: PACU Anesthesia Type: MAC Level of consciousness: awake and alert Pain management: pain level controlled Vital Signs Assessment: post-procedure vital signs reviewed and stable Respiratory status: spontaneous breathing, nonlabored ventilation and respiratory function stable Cardiovascular status: stable and blood pressure returned to baseline Anesthetic complications: no       Last Vitals:  Vitals:   04/27/16 0950 04/27/16 1000  BP: 135/75 137/81  Pulse: 70 69  Resp: (!) 27 (!) 23  Temp:      Last Pain:  Vitals:   04/27/16 0940  TempSrc: Oral                 Jaedon Siler,W. EDMOND

## 2016-04-27 NOTE — Interval H&P Note (Signed)
History and Physical Interval Note:  04/27/2016 9:02 AM  [name]  has presented today for surgery, with the diagnosis of screening  The various methods of treatment have been discussed with the patient and family. After consideration of risks, benefits and other options for treatment, the patient has consented to  Procedure(s): COLONOSCOPY WITH PROPOFOL (N/A) as a surgical intervention .  The patient's history has been reviewed, patient examined, no change in status, stable for surgery.  I have reviewed the patient's chart and labs.  Questions were answered to the patient's satisfaction.     [name]

## 2016-05-03 ENCOUNTER — Ambulatory Visit (INDEPENDENT_AMBULATORY_CARE_PROVIDER_SITE_OTHER): Payer: Medicare Other

## 2016-05-03 ENCOUNTER — Encounter (INDEPENDENT_AMBULATORY_CARE_PROVIDER_SITE_OTHER): Payer: Self-pay

## 2016-05-03 DIAGNOSIS — Z5181 Encounter for therapeutic drug level monitoring: Secondary | ICD-10-CM | POA: Diagnosis not present

## 2016-05-03 DIAGNOSIS — I059 Rheumatic mitral valve disease, unspecified: Secondary | ICD-10-CM

## 2016-05-03 LAB — POCT INR: INR: 1.7

## 2016-05-10 NOTE — Progress Notes (Signed)
Patient ID: [name], female   DOB: April 02, 1955, 61 y.o.   MRN: [id number]     Cardiology Office Note   Date:  05/11/2016   ID:  Natalie, [name] 23-Sep-1955, MRN [id number]  PCP:  [name],[name], PA-C    No chief complaint on file. f/u mitral valve repair   Wt Readings from Last 3 Encounters:  05/11/16 237 lb 12.8 oz (107.9 kg)  04/27/16 235 lb (106.6 kg)  05/29/15 223 lb (101.2 kg)       History of Present Illness: [name] is a 61 y.o. female  who had a mitral valve repair in 2014. This was complicated by a post op CVA. She has had atrial flutter. Dr. Rayann Heman recommended amio followed by cardioversion. She had the cardioversion in 07/2012.   She is on disability at this point. She has some trouble going up stairs, and walking some distance even on flat ground.    no Leg edema-    Denies : Chest pain.  Dizziness.  Orthopnea.  Palpitations.  Syncope.   She walks some and tries to avoid falling.  Coumadin has not been well regulated.  Dose has been changed several times.  No blood in the stool.  SHe has some DOE.  SHe is not exercising regularly.  She has gained weight since the last visit.    She had a colonoscopy that was ok.  No repeat needed for 10 years.  No bleeding issues.      Past Medical History:  Diagnosis Date  . Abnormal chest CT   . Asthma   . Atrial enlargement, left    severe  . Atrial fibrillation ([place]) 01/05/2011   Chronic persistent, failed DCCV   . Atrial flutter ([place])   . Bronchospasm 1998  . Carotid bruit   . Chronic diastolic congestive heart failure ([place])   . Diabetes mellitus    insulin dependent  . Heart murmur   . History of blood transfusion   . Hypercholesterolemia   . Hypertension   . Mitral regurgitation   . Obesity   . Obesity (BMI 30-39.9) 01/16/2012  . Persistent atrial fibrillation ([place])   . Polyp of rectum   . Rheumatic fever 01/16/2012   Reported during childhood  . S/P Maze operation for atrial fibrillation 02/15/2012    Complete biatrial lesion set using bipolar radiofrequency and cryothermy ablation with clipping of LA appendage  . S/P mitral valve replacement with metallic valve 02/13/5571   67mm Sorin [employer]omed[employer] Optiform mechanical prosthesis  . 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 Shortness of breath    with exertion  . Sleep apnea    DOES NOT HAVE CPAP  . Tricuspid regurgitation 01/16/2012    Past Surgical History:  Procedure Laterality Date  . CARDIAC CATHETERIZATION  >5 years  . CARDIOVASCULAR STRESS TEST  09/2010  . CARDIOVERSION  03/25/2011   Procedure: CARDIOVERSION;  Surgeon: Jettie Booze, MD;  Location: [place];  Service: Cardiovascular;  Laterality: N/A;  . CARDIOVERSION N/A 07/27/2012   Procedure: CARDIOVERSION;  Surgeon: Jettie Booze, MD;  Location: [place];  Service: Cardiovascular;  Laterality: N/A;  . COLONOSCOPY WITH PROPOFOL N/A 04/27/2016   Procedure: COLONOSCOPY WITH PROPOFOL;  Surgeon: Wilford Corner, MD;  Location[facility] ENDOSCOPY;  Service: Endoscopy;  Laterality: N/A;  . INTRAOPERATIVE TRANSESOPHAGEAL ECHOCARDIOGRAM  02/15/2012   Procedure: INTRAOPERATIVE TRANSESOPHAGEAL ECHOCARDIOGRAM;  Surgeon: Rexene Alberts, MD;  Location: [place];  Service: Open  Heart Surgery;  Laterality: N/A;  . MAZE  02/15/2012   Procedure: MAZE;  Surgeon: Rexene Alberts, MD;  Location: [place];  Service: Open Heart Surgery;  Laterality: N/A;  . MITRAL VALVE REPLACEMENT  02/15/2012   Procedure: MITRAL VALVE (MV) REPLACEMENT;  Surgeon: Rexene Alberts, MD;  Location: [place];  Service: Open Heart Surgery;  Laterality: N/A;  . NO PAST SURGERIES    . TEE WITHOUT CARDIOVERSION  12/21/2011   Procedure: TRANSESOPHAGEAL ECHOCARDIOGRAM (TEE);  Surgeon: Jettie Booze, MD;  [facility] END[facility]COPY;  Service: Cardiovascular;  Laterality: N/A;  . TRICUSPID VALVE REPLACEMENT  02/15/2012   Procedure: TRICUSPID VALVE REPAIR;  Surgeon: Rexene Alberts, MD;  Location:  [place];  Service: Open Heart Surgery;  Laterality: N/A;     Current Outpatient Prescriptions  Medication Sig Dispense Refill  . albuterol (PROVENTIL HFA;VENTOLIN HFA) 108 (90 BASE) MCG/ACT inhaler Inhale 2 puffs into the lungs every 6 (six) hours as needed. For wheezing    . amiodarone (PACERONE) 200 MG tablet Take 1 tablet (200 mg total) by mouth daily. 90 tablet 3  . amLODipine (NORVASC) 5 MG tablet Take 1 tablet (5 mg total) by mouth daily. 90 tablet 3  . atorvastatin (LIPITOR) 40 MG tablet Take 1 tablet (40 mg total) by mouth daily. 90 tablet 3  . calcium carbonate (OS-CAL - DOSED IN MG OF ELEMENTAL CALCIUM) 1250 MG tablet Take 1 tablet by mouth daily.      . canagliflozin (INVOKANA) 100 MG TABS tablet Take 100 mg by mouth at bedtime.    . carvedilol (COREG) 12.5 MG tablet TAKE 1 TABLET BY MOUTH TWICE DAILY WITH A MEAL 180 tablet 3  . Cholecalciferol (VITAMIN D) 1000 UNITS capsule Take 1,000 Units by mouth daily.     . cyclobenzaprine (FLEXERIL) 10 MG tablet Take 1 tablet by mouth daily as needed. Muscle spasms    . enoxaparin (LOVENOX) 100 MG/ML injection Inject 1 mL (100 mg total) into the skin every 12 (twelve) hours. 10 Syringe 0  . furosemide (LASIX) 40 MG tablet Take 1 tablet (40 mg total) by mouth 2 (two) times daily. 180 tablet 3  . insulin glargine (LANTUS SOLOSTAR) 100 UNIT/ML injection Inject 30-35 Units into the skin See admin instructions. Inject 30 units once daily and inject 35 units daily at bedtime.    . irbesartan (AVAPRO) 300 MG tablet Take 1 tablet (300 mg total) by mouth daily. 90 tablet 3  . metFORMIN (GLUCOPHAGE) 500 MG tablet Take 500 mg by mouth 2 (two) times daily with a meal.     . methocarbamol (ROBAXIN) 500 MG tablet Take 500 mg by mouth daily as needed for muscle spasms.     . Omega-3 Fatty Acids (FISH OIL) 1000 MG CAPS Take 1 capsule by mouth daily.     . potassium chloride SA (K-DUR,KLOR-CON) 20 MEQ tablet Take 1 tablet (20 mEq total) by mouth daily. 90 tablet  3  . warfarin (COUMADIN) 4 MG tablet TAKE 1 AND 1/2 TO 2 TABLETS(6 TO 8 MG) BY MOUTH DAILY AS DIRECTED 60 tablet 3  . warfarin (COUMADIN) 4 MG tablet TAKE 1 TABLET BY MOUTH AS DIRECTED 60 tablet 3   No current facility-administered medications for this visit.     Allergies:   Patient has no known allergies.    Social History:  The patient  reports that she has never smoked. She has never used smokeless tobacco. She reports that she does not drink alcohol or use  drugs.   Family History:  The patient's family history includes Asthma in her mother; Diabetes in her mother; Hypertension in her brother.    ROS:  Please see the history of present illness.   Otherwise, review of systems are positive for chest burning at the scar; DOE.   All other systems are reviewed and negative.    PHYSICAL EXAM: VS:  BP 136/80   Pulse 86   Ht 5\' 8"  (1.727 m)   Wt 237 lb 12.8 oz (107.9 kg)   SpO2 97%   BMI 36.16 kg/m  , BMI Body mass index is 36.16 kg/m. GEN: Well nourished, well developed, in no acute distress  HEENT: normal  Neck: no JVD, carotid bruits, or masses Cardiac: Crisp S1 clkick, RRR; 2/6 systolic murmur, no rubs, or gallops,no edema  Respiratory:  clear to auscultation bilaterally, normal work of breathing GI: soft, nontender, nondistended, + BS MS: no deformity or atrophy  Skin: warm and dry, no rash Neuro:  Strength and sensation are intact Psych: euthymic mood, full affect   EKG:   The ekg ordered today demonstrates NSR, LBBB, prolonged PR interval   Recent Labs: 05/28/2015: ALT 20; BUN 9; Creat 0.67; Hemoglobin 13.0; Platelets 144; Potassium 4.2; Sodium 137; TSH 1.52   Lipid Panel    Component Value Date/Time   CHOL 152 05/28/2015 0958   TRIG 108 05/28/2015 0958   HDL 35 (L) 05/28/2015 0958   CHOLHDL 4.3 05/28/2015 0958   VLDL 22 05/28/2015 0958   LDLCALC 95 05/28/2015 0958     Other studies Reviewed: Additional studies/ records that were reviewed today with results  demonstrating: labs and echo from yesterday.   ASSESSMENT AND PLAN:  1. Mitral valve disorders: needs SBE prophylaxis. Mechanical mitral valve placed in February 2014.  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 s/p CVA post operatively. EF decreased post op, but 35-40% in May 2017. Appears euvolemic at this time. No signs of heart failure.  Chronic systolic heart failure. 2. Atrial flutter:  Sucessful cardioversion in 2015. Coumadin for stroke prevention. Amiodarone to maintain NSR. Tolerating once a day Amiodarone.  3. HTN: Still not Checking BP outside of the doctor's office regularly.  COntrolled today. If systolic above 295, let us know.Changed to Irbesartan 300 mg daily. Continue amlodipine 5 mg daily. Systolic 284 mm Hg in 1/32. May need to increase amlodipine from 5 mg to 10 mg, if BP high at home.  4. SHOB: Continue Lasix Tablet, 40 MG, 1 tablet, Orally, BID;  Moved second dose to after lunch.  I stressed the importance of trying to exercies regularly to increase her stamina.  She can try an exercise bike.  5.  Check BP at home. She has not been checking. Known LV systolic dysfunction. Stressed importance of BP control to help forward blood flow. She does not have a blood pressure cuff at home. She can check at a local drug store. 6. DM:  A1C in August 2017 was 13.4.  LDL target < 100. Continue atorvastatin. She will need lipids, liver and TSH checked in June at her physical.   Current medicines are reviewed at length with the patient today.  The patient concerns regarding her medicines were addressed.  The following changes have been made:  none  Labs/ tests ordered today include:  No orders of the defined types were placed in this encounter.   Recommend 150 minutes/week of aerobic exercise Low fat, low carb, high fiber diet recommended  Disposition:   FU  in 1 year   Signed, Larae Grooms, MD  05/11/2016 [address], [place], [place]  [address] Phone: [phone]; Fax: [phone]

## 2016-05-11 ENCOUNTER — Encounter (INDEPENDENT_AMBULATORY_CARE_PROVIDER_SITE_OTHER): Payer: Self-pay

## 2016-05-11 ENCOUNTER — Ambulatory Visit (INDEPENDENT_AMBULATORY_CARE_PROVIDER_SITE_OTHER): Payer: Medicare Other | Admitting: *Deleted

## 2016-05-11 ENCOUNTER — Ambulatory Visit (INDEPENDENT_AMBULATORY_CARE_PROVIDER_SITE_OTHER): Payer: Medicare Other | Admitting: Interventional Cardiology

## 2016-05-11 ENCOUNTER — Encounter: Payer: Self-pay | Admitting: Interventional Cardiology

## 2016-05-11 VITALS — BP 136/80 | HR 86 | Ht 68.0 in | Wt 237.8 lb

## 2016-05-11 DIAGNOSIS — Z952 Presence of prosthetic heart valve: Secondary | ICD-10-CM | POA: Diagnosis not present

## 2016-05-11 DIAGNOSIS — I480 Paroxysmal atrial fibrillation: Secondary | ICD-10-CM | POA: Diagnosis not present

## 2016-05-11 DIAGNOSIS — E118 Type 2 diabetes mellitus with unspecified complications: Secondary | ICD-10-CM | POA: Diagnosis not present

## 2016-05-11 DIAGNOSIS — Z7901 Long term (current) use of anticoagulants: Secondary | ICD-10-CM

## 2016-05-11 DIAGNOSIS — I059 Rheumatic mitral valve disease, unspecified: Secondary | ICD-10-CM | POA: Diagnosis not present

## 2016-05-11 DIAGNOSIS — Z5181 Encounter for therapeutic drug level monitoring: Secondary | ICD-10-CM

## 2016-05-11 DIAGNOSIS — I5022 Chronic systolic (congestive) heart failure: Secondary | ICD-10-CM

## 2016-05-11 LAB — POCT INR: INR: 1.8

## 2016-05-11 MED ORDER — ENOXAPARIN SODIUM 100 MG/ML ~~LOC~~ SOLN: 100.0000 mg | Freq: Two times a day (BID) | SUBCUTANEOUS | 0 refills | Status: DC

## 2016-05-11 NOTE — Patient Instructions (Signed)

## 2016-05-16 ENCOUNTER — Ambulatory Visit (INDEPENDENT_AMBULATORY_CARE_PROVIDER_SITE_OTHER): Payer: Medicare Other | Admitting: *Deleted

## 2016-05-16 DIAGNOSIS — I059 Rheumatic mitral valve disease, unspecified: Secondary | ICD-10-CM | POA: Diagnosis not present

## 2016-05-16 DIAGNOSIS — Z5181 Encounter for therapeutic drug level monitoring: Secondary | ICD-10-CM

## 2016-05-16 LAB — POCT INR: INR: 1.8

## 2016-05-23 ENCOUNTER — Ambulatory Visit (INDEPENDENT_AMBULATORY_CARE_PROVIDER_SITE_OTHER): Payer: Medicare Other | Admitting: *Deleted

## 2016-05-23 DIAGNOSIS — I059 Rheumatic mitral valve disease, unspecified: Secondary | ICD-10-CM

## 2016-05-23 DIAGNOSIS — Z5181 Encounter for therapeutic drug level monitoring: Secondary | ICD-10-CM | POA: Diagnosis not present

## 2016-05-23 LAB — POCT INR: INR: 2.3

## 2016-06-06 ENCOUNTER — Other Ambulatory Visit: Payer: Self-pay | Admitting: Interventional Cardiology

## 2016-06-07 ENCOUNTER — Ambulatory Visit (INDEPENDENT_AMBULATORY_CARE_PROVIDER_SITE_OTHER): Payer: Medicare Other | Admitting: *Deleted

## 2016-06-07 DIAGNOSIS — Z5181 Encounter for therapeutic drug level monitoring: Secondary | ICD-10-CM

## 2016-06-07 DIAGNOSIS — I059 Rheumatic mitral valve disease, unspecified: Secondary | ICD-10-CM | POA: Diagnosis not present

## 2016-06-07 LAB — POCT INR: INR: 2.2

## 2016-06-08 NOTE — Telephone Encounter (Signed)
Patient was just seen but has not had lipids checked in over a year. Please advise on refill request. Thanks, MI

## 2016-06-09 ENCOUNTER — Other Ambulatory Visit: Payer: Self-pay

## 2016-06-09 MED ORDER — ATORVASTATIN CALCIUM 40 MG PO TABS: 40.0000 mg | ORAL_TABLET | Freq: Every day | ORAL | 3 refills | Status: DC

## 2016-06-09 NOTE — Telephone Encounter (Signed)
Patient states that she is having her labs drawn at her PCP on 06/21/16 and will have them fax over the results. Refill has already been sent in.

## 2016-06-17 ENCOUNTER — Ambulatory Visit (INDEPENDENT_AMBULATORY_CARE_PROVIDER_SITE_OTHER): Payer: Medicare Other | Admitting: *Deleted

## 2016-06-17 DIAGNOSIS — I059 Rheumatic mitral valve disease, unspecified: Secondary | ICD-10-CM

## 2016-06-17 DIAGNOSIS — Z5181 Encounter for therapeutic drug level monitoring: Secondary | ICD-10-CM | POA: Diagnosis not present

## 2016-06-17 LAB — POCT INR: INR: 2.6

## 2016-06-21 DIAGNOSIS — E1122 Type 2 diabetes mellitus with diabetic chronic kidney disease: Secondary | ICD-10-CM | POA: Diagnosis not present

## 2016-06-21 DIAGNOSIS — Z794 Long term (current) use of insulin: Secondary | ICD-10-CM | POA: Diagnosis not present

## 2016-06-21 DIAGNOSIS — Z7984 Long term (current) use of oral hypoglycemic drugs: Secondary | ICD-10-CM | POA: Diagnosis not present

## 2016-06-22 ENCOUNTER — Other Ambulatory Visit: Payer: Self-pay | Admitting: Interventional Cardiology

## 2016-07-01 ENCOUNTER — Ambulatory Visit (INDEPENDENT_AMBULATORY_CARE_PROVIDER_SITE_OTHER): Payer: Medicare Other | Admitting: *Deleted

## 2016-07-01 DIAGNOSIS — Z5181 Encounter for therapeutic drug level monitoring: Secondary | ICD-10-CM

## 2016-07-01 DIAGNOSIS — I059 Rheumatic mitral valve disease, unspecified: Secondary | ICD-10-CM

## 2016-07-01 LAB — POCT INR: INR: 3.3

## 2016-07-22 ENCOUNTER — Ambulatory Visit (INDEPENDENT_AMBULATORY_CARE_PROVIDER_SITE_OTHER): Payer: Medicare Other | Admitting: *Deleted

## 2016-07-22 DIAGNOSIS — I059 Rheumatic mitral valve disease, unspecified: Secondary | ICD-10-CM

## 2016-07-22 DIAGNOSIS — Z5181 Encounter for therapeutic drug level monitoring: Secondary | ICD-10-CM | POA: Diagnosis not present

## 2016-07-22 LAB — PROTIME-INR
INR: 4.8 — ABNORMAL HIGH (ref 0.8–1.2)
Prothrombin Time: 46.1 s — ABNORMAL HIGH (ref 9.1–12.0)

## 2016-07-22 LAB — POCT INR: INR: 6.5

## 2016-07-29 ENCOUNTER — Other Ambulatory Visit: Payer: Self-pay | Admitting: Physician Assistant

## 2016-07-29 ENCOUNTER — Ambulatory Visit
Admission: RE | Admit: 2016-07-29 | Discharge: 2016-07-29 | Disposition: A | Discharge status: Home / Self Care | Payer: Medicare Other | Source: Ambulatory Visit | Attending: Physician Assistant | Admitting: Physician Assistant

## 2016-07-29 DIAGNOSIS — R0602 Shortness of breath: Secondary | ICD-10-CM | POA: Diagnosis not present

## 2016-07-29 DIAGNOSIS — I110 Hypertensive heart disease with heart failure: Secondary | ICD-10-CM | POA: Diagnosis not present

## 2016-07-29 DIAGNOSIS — R609 Edema, unspecified: Secondary | ICD-10-CM | POA: Diagnosis not present

## 2016-07-29 DIAGNOSIS — I509 Heart failure, unspecified: Secondary | ICD-10-CM | POA: Diagnosis not present

## 2016-08-04 ENCOUNTER — Ambulatory Visit (INDEPENDENT_AMBULATORY_CARE_PROVIDER_SITE_OTHER): Payer: Medicare Other | Admitting: *Deleted

## 2016-08-04 DIAGNOSIS — Z5181 Encounter for therapeutic drug level monitoring: Secondary | ICD-10-CM | POA: Diagnosis not present

## 2016-08-04 DIAGNOSIS — I059 Rheumatic mitral valve disease, unspecified: Secondary | ICD-10-CM | POA: Diagnosis not present

## 2016-08-04 LAB — POCT INR: INR: 5.7

## 2016-08-19 ENCOUNTER — Ambulatory Visit (INDEPENDENT_AMBULATORY_CARE_PROVIDER_SITE_OTHER): Payer: Medicare Other | Admitting: *Deleted

## 2016-08-19 DIAGNOSIS — Z5181 Encounter for therapeutic drug level monitoring: Secondary | ICD-10-CM

## 2016-08-19 DIAGNOSIS — I059 Rheumatic mitral valve disease, unspecified: Secondary | ICD-10-CM

## 2016-08-19 LAB — POCT INR: INR: 4.4

## 2016-09-02 ENCOUNTER — Other Ambulatory Visit: Payer: Self-pay | Admitting: Physician Assistant

## 2016-09-02 ENCOUNTER — Ambulatory Visit (INDEPENDENT_AMBULATORY_CARE_PROVIDER_SITE_OTHER): Payer: Medicare Other | Admitting: *Deleted

## 2016-09-02 ENCOUNTER — Other Ambulatory Visit: Payer: Self-pay | Admitting: Interventional Cardiology

## 2016-09-02 DIAGNOSIS — Z1231 Encounter for screening mammogram for malignant neoplasm of breast: Secondary | ICD-10-CM

## 2016-09-02 DIAGNOSIS — I059 Rheumatic mitral valve disease, unspecified: Secondary | ICD-10-CM

## 2016-09-02 DIAGNOSIS — Z5181 Encounter for therapeutic drug level monitoring: Secondary | ICD-10-CM

## 2016-09-02 LAB — POCT INR: INR: 3.5

## 2016-09-14 ENCOUNTER — Other Ambulatory Visit: Payer: Self-pay | Admitting: Physician Assistant

## 2016-09-14 DIAGNOSIS — Z124 Encounter for screening for malignant neoplasm of cervix: Secondary | ICD-10-CM | POA: Diagnosis not present

## 2016-09-14 DIAGNOSIS — Z23 Encounter for immunization: Secondary | ICD-10-CM | POA: Diagnosis not present

## 2016-09-14 DIAGNOSIS — Z794 Long term (current) use of insulin: Secondary | ICD-10-CM | POA: Diagnosis not present

## 2016-09-14 DIAGNOSIS — Z952 Presence of prosthetic heart valve: Secondary | ICD-10-CM | POA: Diagnosis not present

## 2016-09-14 DIAGNOSIS — I110 Hypertensive heart disease with heart failure: Secondary | ICD-10-CM | POA: Diagnosis not present

## 2016-09-14 DIAGNOSIS — E1122 Type 2 diabetes mellitus with diabetic chronic kidney disease: Secondary | ICD-10-CM | POA: Diagnosis not present

## 2016-09-14 DIAGNOSIS — E7800 Pure hypercholesterolemia, unspecified: Secondary | ICD-10-CM | POA: Diagnosis not present

## 2016-09-14 DIAGNOSIS — Z0000 Encounter for general adult medical examination without abnormal findings: Secondary | ICD-10-CM | POA: Diagnosis not present

## 2016-09-14 DIAGNOSIS — I4891 Unspecified atrial fibrillation: Secondary | ICD-10-CM | POA: Diagnosis not present

## 2016-09-14 DIAGNOSIS — E1165 Type 2 diabetes mellitus with hyperglycemia: Secondary | ICD-10-CM | POA: Diagnosis not present

## 2016-09-16 LAB — CYTOLOGY - PAP

## 2016-09-18 ENCOUNTER — Other Ambulatory Visit: Payer: Self-pay | Admitting: Interventional Cardiology

## 2016-09-18 DIAGNOSIS — I4819 Other persistent atrial fibrillation: Secondary | ICD-10-CM

## 2016-09-19 ENCOUNTER — Other Ambulatory Visit: Payer: Self-pay | Admitting: *Deleted

## 2016-09-19 MED ORDER — AMLODIPINE BESYLATE 5 MG PO TABS: 5.0000 mg | ORAL_TABLET | Freq: Every day | ORAL | 2 refills | Status: DC

## 2016-09-23 ENCOUNTER — Ambulatory Visit (INDEPENDENT_AMBULATORY_CARE_PROVIDER_SITE_OTHER): Payer: Medicare Other | Admitting: *Deleted

## 2016-09-23 ENCOUNTER — Ambulatory Visit
Admission: RE | Admit: 2016-09-23 | Discharge: 2016-09-23 | Disposition: A | Discharge status: Home / Self Care | Payer: Medicare Other | Source: Ambulatory Visit | Attending: Physician Assistant | Admitting: Physician Assistant

## 2016-09-23 DIAGNOSIS — Z1231 Encounter for screening mammogram for malignant neoplasm of breast: Secondary | ICD-10-CM

## 2016-09-23 DIAGNOSIS — Z5181 Encounter for therapeutic drug level monitoring: Secondary | ICD-10-CM

## 2016-09-23 DIAGNOSIS — I059 Rheumatic mitral valve disease, unspecified: Secondary | ICD-10-CM | POA: Diagnosis not present

## 2016-09-23 LAB — POCT INR: INR: 3.8

## 2016-10-03 ENCOUNTER — Ambulatory Visit (INDEPENDENT_AMBULATORY_CARE_PROVIDER_SITE_OTHER): Payer: Medicare Other | Admitting: Internal Medicine

## 2016-10-03 ENCOUNTER — Ambulatory Visit (INDEPENDENT_AMBULATORY_CARE_PROVIDER_SITE_OTHER): Payer: Medicare Other | Admitting: *Deleted

## 2016-10-03 ENCOUNTER — Encounter: Payer: Self-pay | Admitting: Internal Medicine

## 2016-10-03 VITALS — BP 148/90 | HR 80 | Ht 66.0 in | Wt 228.0 lb

## 2016-10-03 DIAGNOSIS — I110 Hypertensive heart disease with heart failure: Secondary | ICD-10-CM

## 2016-10-03 DIAGNOSIS — I059 Rheumatic mitral valve disease, unspecified: Secondary | ICD-10-CM

## 2016-10-03 DIAGNOSIS — Z952 Presence of prosthetic heart valve: Secondary | ICD-10-CM

## 2016-10-03 DIAGNOSIS — I5082 Biventricular heart failure: Secondary | ICD-10-CM

## 2016-10-03 DIAGNOSIS — I5023 Acute on chronic systolic (congestive) heart failure: Secondary | ICD-10-CM | POA: Diagnosis not present

## 2016-10-03 DIAGNOSIS — R0602 Shortness of breath: Secondary | ICD-10-CM | POA: Diagnosis not present

## 2016-10-03 DIAGNOSIS — I480 Paroxysmal atrial fibrillation: Secondary | ICD-10-CM

## 2016-10-03 DIAGNOSIS — I447 Left bundle-branch block, unspecified: Secondary | ICD-10-CM

## 2016-10-03 DIAGNOSIS — Z954 Presence of other heart-valve replacement: Secondary | ICD-10-CM

## 2016-10-03 DIAGNOSIS — Z8679 Personal history of other diseases of the circulatory system: Secondary | ICD-10-CM

## 2016-10-03 DIAGNOSIS — Z9889 Other specified postprocedural states: Secondary | ICD-10-CM

## 2016-10-03 DIAGNOSIS — I519 Heart disease, unspecified: Secondary | ICD-10-CM | POA: Diagnosis not present

## 2016-10-03 DIAGNOSIS — Z5181 Encounter for therapeutic drug level monitoring: Secondary | ICD-10-CM

## 2016-10-03 LAB — COMPREHENSIVE METABOLIC PANEL
A/G RATIO: 1.3 (ref 1.2–2.2)
ALBUMIN: 4.4 g/dL (ref 3.6–4.8)
ALT: 19 IU/L (ref 0–32)
AST: 25 IU/L (ref 0–40)
Alkaline Phosphatase: 122 IU/L — ABNORMAL HIGH (ref 39–117)
BUN / CREAT RATIO: 16 (ref 12–28)
BUN: 14 mg/dL (ref 8–27)
Bilirubin Total: 0.5 mg/dL (ref 0.0–1.2)
CALCIUM: 9.4 mg/dL (ref 8.7–10.3)
CO2: 26 mmol/L (ref 20–29)
Chloride: 101 mmol/L (ref 96–106)
Creatinine, Ser: 0.87 mg/dL (ref 0.57–1.00)
GFR, EST AFRICAN AMERICAN: 83 mL/min/{1.73_m2} (ref >59)
GFR, EST NON AFRICAN AMERICAN: 72 mL/min/{1.73_m2} (ref >59)
GLOBULIN, TOTAL: 3.3 g/dL (ref 1.5–4.5)
Glucose: 228 mg/dL — ABNORMAL HIGH (ref 65–99)
POTASSIUM: 4.2 mmol/L (ref 3.5–5.2)
SODIUM: 140 mmol/L (ref 134–144)
TOTAL PROTEIN: 7.7 g/dL (ref 6.0–8.5)

## 2016-10-03 LAB — POCT INR: INR: 4.8

## 2016-10-03 LAB — TSH: TSH: 2.94 u[IU]/mL (ref 0.450–4.500)

## 2016-10-03 NOTE — Progress Notes (Signed)
PCP: Lennie Odor, PA-C Primary Cardiologist: Dr Irish Lack Primary EP: Dr Rayann Heman  [name] is a 61 y.o. female who presents today for routine electrophysiology followup.  Since last being seen in our clinic, the patient reports doing reasonably well.  She has noticed some increase in SOB and fatigue since her last visit. Her PCP has increased lasix to 60mg  daily.  Today, she denies symptoms of palpitations, chest pain, lower extremity edema, dizziness, presyncope, or syncope.  The patient is otherwise without complaint today.   Past Medical History:  Diagnosis Date  . Abnormal chest CT   . Asthma   . Atrial enlargement, left    severe  . Atrial fibrillation ([place]) 01/05/2011   Chronic persistent, failed DCCV   . Atrial flutter ([place])   . Bronchospasm 1998  . Carotid bruit   . Chronic diastolic congestive heart failure ([place])   . Diabetes mellitus    insulin dependent  . Heart murmur   . History of blood transfusion   . Hypercholesterolemia   . Hypertension   . Mitral regurgitation   . Obesity   . Obesity (BMI 30-39.9) 01/16/2012  . Persistent atrial fibrillation ([place])   . Polyp of rectum   . Rheumatic fever 01/16/2012   Reported during childhood  . S/P Maze operation for atrial fibrillation 02/15/2012   Complete biatrial lesion set using bipolar radiofrequency and cryothermy ablation with clipping of LA appendage  . S/P mitral valve replacement with metallic valve 0/02/7251   85mm Sorin [employer]omed[employer] Optiform mechanical prosthesis  . 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 Shortness of breath    with exertion  . Sleep apnea    DOES NOT HAVE CPAP  . Tricuspid regurgitation 01/16/2012   Past Surgical History:  Procedure Laterality Date  . CARDIAC CATHETERIZATION  >5 years  . CARDIOVASCULAR STRESS TEST  09/2010  . CARDIOVERSION  03/25/2011   Procedure: CARDIOVERSION;  Surgeon: Jettie Booze, MD;  Location: [place];  Service:  Cardiovascular;  Laterality: N/A;  . CARDIOVERSION N/A 07/27/2012   Procedure: CARDIOVERSION;  Surgeon: Jettie Booze, MD;  Location: [place];  Service: Cardiovascular;  Laterality: N/A;  . COLONOSCOPY WITH PROPOFOL N/A 04/27/2016   Procedure: COLONOSCOPY WITH PROPOFOL;  Surgeon: Wilford Corner, MD;  Location[facility] ENDOSCOPY;  Service: Endoscopy;  Laterality: N/A;  . INTRAOPERATIVE TRANSESOPHAGEAL ECHOCARDIOGRAM  02/15/2012   Procedure: INTRAOPERATIVE TRANSESOPHAGEAL ECHOCARDIOGRAM;  Surgeon: Rexene Alberts, MD;  Location: [place];  Service: Open Heart Surgery;  Laterality: N/A;  . MAZE  02/15/2012   Procedure: MAZE;  Surgeon: Rexene Alberts, MD;  Location: [place];  Service: Open Heart Surgery;  Laterality: N/A;  . MITRAL VALVE REPLACEMENT  02/15/2012   Procedure: MITRAL VALVE (MV) REPLACEMENT;  Surgeon: Rexene Alberts, MD;  Location: [place];  Service: Open Heart Surgery;  Laterality: N/A;  . NO PAST SURGERIES    . TEE WITHOUT CARDIOVERSION  12/21/2011   Procedure: TRANSESOPHAGEAL ECHOCARDIOGRAM (TEE);  Surgeon: Jettie Booze, MD;  Location: [facility] ENDOSCOPY;  Service: Cardiovascular;  Laterality: N/A;  . TRICUSPID VALVE REPLACEMENT  02/15/2012   Procedure: TRICUSPID VALVE REPAIR;  Surgeon: Rexene Alberts, MD;  Location: [place];  Service: Open Heart Surgery;  Laterality: N/A;    ROS- all systems are reviewed and negatives except as per HPI above  Current Outpatient Prescriptions  Medication Sig Dispense Refill  . albuterol (PROVENTIL HFA;VENTOLIN HFA) 108 (90 BASE) MCG/ACT inhaler Inhale 2  puffs into the lungs every 6 (six) hours as needed. For wheezing    . amiodarone (PACERONE) 200 MG tablet TAKE 1 TABLET(200 MG) BY MOUTH DAILY 90 tablet 2  . amLODipine (NORVASC) 5 MG tablet Take 1 tablet (5 mg total) by mouth daily. 90 tablet 2  . atorvastatin (LIPITOR) 40 MG tablet Take 1 tablet (40 mg total) by mouth daily. 90 tablet 3  . calcium carbonate (OS-CAL - DOSED IN MG OF ELEMENTAL  CALCIUM) 1250 MG tablet Take 1 tablet by mouth daily.      . carvedilol (COREG) 12.5 MG tablet TAKE 1 TABLET BY MOUTH TWICE DAILY WITH A MEAL 180 tablet 3  . Cholecalciferol (VITAMIN D) 1000 UNITS capsule Take 1,000 Units by mouth daily.     . cyclobenzaprine (FLEXERIL) 10 MG tablet Take 1 tablet by mouth daily as needed. Muscle spasms    . enoxaparin (LOVENOX) 100 MG/ML injection Inject 1 mL (100 mg total) into the skin every 12 (twelve) hours. 10 Syringe 0  . furosemide (LASIX) 40 MG tablet Take 1.5 (60 mg) by mouth twice daily    . insulin glargine (LANTUS SOLOSTAR) 100 UNIT/ML injection Inject 30-35 Units into the skin See admin instructions. Inject 30 units once daily and inject 35 units daily at bedtime.    . irbesartan (AVAPRO) 300 MG tablet TAKE 1 TABLET(300 MG) BY MOUTH DAILY 90 tablet 2  . metFORMIN (GLUCOPHAGE) 500 MG tablet Take 500 mg by mouth 2 (two) times daily with a meal.     . methocarbamol (ROBAXIN) 500 MG tablet Take 500 mg by mouth daily as needed for muscle spasms.     . Omega-3 Fatty Acids (FISH OIL) 1000 MG CAPS Take 1 capsule by mouth daily.     . potassium chloride SA (K-DUR,KLOR-CON) 20 MEQ tablet Take 1 tablet (20 mEq total) by mouth daily. 90 tablet 3  . warfarin (COUMADIN) 4 MG tablet TAKE 1 AND 1/2 TO 2 TABLETS(6 TO 8 MG) BY MOUTH DAILY AS DIRECTED 60 tablet 3  . warfarin (COUMADIN) 4 MG tablet TAKE 1 TABLET BY MOUTH AS DIRECTED 60 tablet 3   No current facility-administered medications for this visit.     Physical Exam: Vitals:   10/03/16 0851  BP: (!) 148/90  Pulse: 80  SpO2: 96%  Weight: 228 lb (103.4 kg)  Height: 5\' 6"  (1.676 m)    GEN- The patient is overweight appearing, alert and oriented x 3 today.   Head- normocephalic, atraumatic Eyes-  Sclera clear, conjunctiva pink Ears- hearing intact Oropharynx- clear Lungs- Clear to ausculation bilaterally, normal work of breathing Heart- Regular rate and rhythm, mechanical S, 2/6 SEM at the apex GI-  soft, NT, ND, + BS Extremities- no clubbing, cyanosis, or edema  EKG tracing ordered today is personally reviewed and shows sinus rhythm 77 bpm, PR 202 msec, LBBB (QRS 164 msec) Echo 05/28/15 reviewed  Assessment and Plan:  1. Persistent afib/ atypical atrial flutter Maintaining sinus rhythm with amiodarone 200mg  daily despite severe LA enlargement On coumadin Lfts, tfts today  2. Nonischemic CM/ acute on chronic systolic dysfunction/ LBBB/ valvular cardiomyopathy Worsening CHF recently 2 gram sodium restriction bmet today I have again offered CRT therapy.  Will update echo to see if candidate for ICD.  She is presently very clear that she wishes to avoid any surgical procedures.  Will continue medical optimization.  3. Hypertensive cardiovascular disease She has not taken her AM medicines today No changes today bmet  4. Obesity Body  mass index is 36.8 kg/m. Lifestyle modification is encouraged  5. Mechanical MVR Followed in coumadin clinic  Follow-up with Dr Irish Lack in a year If EF has fallen further, will arrange general cardiology follow-up for medicine optimization (such as entresto) Return to see EP NP in a year  Thompson Grayer MD, [facility] 10/03/2016 9:06 AM

## 2016-10-03 NOTE — Patient Instructions (Signed)
Medication Instructions:  Your physician recommends that you continue on your current medications as directed. Please refer to the Current Medication list given to you today.   Labwork: Your physician recommends that you return for lab work today: CMET/TSH   Testing/Procedures: Your physician has requested that you have an echocardiogram. Echocardiography is a painless test that uses sound waves to create images of your heart. It provides your doctor with information about the size and shape of your heart and how well your heart's chambers and valves are working. This procedure takes approximately one hour. There are no restrictions for this procedure.    Follow-Up: Your physician wants you to follow-up in: 12 months with Chanetta Marshall, NP You will receive a reminder letter in the mail two months in advance. If you don't receive a letter, please call our office to schedule the follow-up appointment.   Any Other Special Instructions Will Be Listed Below (If Applicable).     If you need a refill on your cardiac medications before your next appointment, please call your pharmacy.

## 2016-10-10 ENCOUNTER — Other Ambulatory Visit: Payer: Self-pay

## 2016-10-10 ENCOUNTER — Ambulatory Visit (HOSPITAL_COMMUNITY): Payer: Medicare Other | Attending: Internal Medicine

## 2016-10-10 DIAGNOSIS — R0602 Shortness of breath: Secondary | ICD-10-CM

## 2016-10-10 DIAGNOSIS — I253 Aneurysm of heart: Secondary | ICD-10-CM | POA: Diagnosis not present

## 2016-10-10 DIAGNOSIS — I519 Heart disease, unspecified: Secondary | ICD-10-CM | POA: Diagnosis not present

## 2016-10-10 DIAGNOSIS — I061 Rheumatic aortic insufficiency: Secondary | ICD-10-CM | POA: Insufficient documentation

## 2016-10-10 DIAGNOSIS — I517 Cardiomegaly: Secondary | ICD-10-CM

## 2016-10-10 DIAGNOSIS — Z952 Presence of prosthetic heart valve: Secondary | ICD-10-CM | POA: Insufficient documentation

## 2016-10-10 HISTORY — DX: Cardiomegaly: I51.7

## 2016-10-11 DIAGNOSIS — I110 Hypertensive heart disease with heart failure: Secondary | ICD-10-CM | POA: Diagnosis not present

## 2016-10-13 ENCOUNTER — Telehealth: Payer: Self-pay | Admitting: *Deleted

## 2016-10-13 NOTE — Telephone Encounter (Signed)
-----   Message from Thompson Grayer, MD sent at 10/12/2016  5:16 PM EDT ----- Results reviewed.  [name], please inform pt of result.  EF >35%.  Not an ICD candidate I will route to primary care also.

## 2016-10-18 ENCOUNTER — Ambulatory Visit (INDEPENDENT_AMBULATORY_CARE_PROVIDER_SITE_OTHER): Payer: Medicare Other | Admitting: *Deleted

## 2016-10-18 DIAGNOSIS — I480 Paroxysmal atrial fibrillation: Secondary | ICD-10-CM

## 2016-10-18 DIAGNOSIS — I059 Rheumatic mitral valve disease, unspecified: Secondary | ICD-10-CM

## 2016-10-18 DIAGNOSIS — Z8679 Personal history of other diseases of the circulatory system: Secondary | ICD-10-CM | POA: Diagnosis not present

## 2016-10-18 DIAGNOSIS — Z954 Presence of other heart-valve replacement: Secondary | ICD-10-CM

## 2016-10-18 DIAGNOSIS — Z952 Presence of prosthetic heart valve: Secondary | ICD-10-CM

## 2016-10-18 DIAGNOSIS — Z9889 Other specified postprocedural states: Secondary | ICD-10-CM | POA: Diagnosis not present

## 2016-10-18 DIAGNOSIS — Z5181 Encounter for therapeutic drug level monitoring: Secondary | ICD-10-CM

## 2016-10-18 LAB — POCT INR: INR: 2.8

## 2016-10-20 NOTE — Telephone Encounter (Signed)
Patient informed. 

## 2016-11-02 DIAGNOSIS — Z780 Asymptomatic menopausal state: Secondary | ICD-10-CM | POA: Diagnosis not present

## 2016-11-09 ENCOUNTER — Ambulatory Visit (INDEPENDENT_AMBULATORY_CARE_PROVIDER_SITE_OTHER): Payer: Medicare Other | Admitting: *Deleted

## 2016-11-09 DIAGNOSIS — Z9889 Other specified postprocedural states: Secondary | ICD-10-CM | POA: Diagnosis not present

## 2016-11-09 DIAGNOSIS — I519 Heart disease, unspecified: Secondary | ICD-10-CM

## 2016-11-09 DIAGNOSIS — Z8679 Personal history of other diseases of the circulatory system: Secondary | ICD-10-CM | POA: Diagnosis not present

## 2016-11-09 DIAGNOSIS — Z952 Presence of prosthetic heart valve: Secondary | ICD-10-CM | POA: Diagnosis not present

## 2016-11-09 DIAGNOSIS — I059 Rheumatic mitral valve disease, unspecified: Secondary | ICD-10-CM

## 2016-11-09 DIAGNOSIS — Z5181 Encounter for therapeutic drug level monitoring: Secondary | ICD-10-CM

## 2016-11-09 DIAGNOSIS — Z954 Presence of other heart-valve replacement: Secondary | ICD-10-CM

## 2016-11-09 LAB — POCT INR: INR: 3

## 2016-12-05 ENCOUNTER — Ambulatory Visit (INDEPENDENT_AMBULATORY_CARE_PROVIDER_SITE_OTHER): Payer: Medicare Other | Admitting: *Deleted

## 2016-12-05 DIAGNOSIS — I059 Rheumatic mitral valve disease, unspecified: Secondary | ICD-10-CM | POA: Diagnosis not present

## 2016-12-05 DIAGNOSIS — Z5181 Encounter for therapeutic drug level monitoring: Secondary | ICD-10-CM | POA: Diagnosis not present

## 2016-12-05 LAB — POCT INR: INR: 2.7

## 2016-12-05 NOTE — Patient Instructions (Signed)
Continue same dose 1.5 tablets everyday except 2 tablets only on  Mondays and Fridays. Recheck INR in 4 weeks.

## 2016-12-20 ENCOUNTER — Other Ambulatory Visit: Payer: Self-pay | Admitting: Interventional Cardiology

## 2017-01-05 ENCOUNTER — Ambulatory Visit (INDEPENDENT_AMBULATORY_CARE_PROVIDER_SITE_OTHER): Payer: Medicare Other | Admitting: *Deleted

## 2017-01-05 DIAGNOSIS — Z5181 Encounter for therapeutic drug level monitoring: Secondary | ICD-10-CM

## 2017-01-05 DIAGNOSIS — I059 Rheumatic mitral valve disease, unspecified: Secondary | ICD-10-CM | POA: Diagnosis not present

## 2017-01-05 LAB — POCT INR: INR: 3.5

## 2017-01-05 NOTE — Patient Instructions (Signed)
Description   Continue same dose 1.5 tablets everyday except 2 tablets only on Mondays and Fridays. Recheck INR in 5 weeks.

## 2017-02-07 ENCOUNTER — Encounter (INDEPENDENT_AMBULATORY_CARE_PROVIDER_SITE_OTHER): Payer: Self-pay

## 2017-02-07 ENCOUNTER — Ambulatory Visit (INDEPENDENT_AMBULATORY_CARE_PROVIDER_SITE_OTHER): Payer: Medicare Other | Admitting: *Deleted

## 2017-02-07 DIAGNOSIS — I059 Rheumatic mitral valve disease, unspecified: Secondary | ICD-10-CM | POA: Diagnosis not present

## 2017-02-07 DIAGNOSIS — Z952 Presence of prosthetic heart valve: Secondary | ICD-10-CM

## 2017-02-07 DIAGNOSIS — Z954 Presence of other heart-valve replacement: Secondary | ICD-10-CM

## 2017-02-07 DIAGNOSIS — Z9889 Other specified postprocedural states: Secondary | ICD-10-CM

## 2017-02-07 DIAGNOSIS — Z5181 Encounter for therapeutic drug level monitoring: Secondary | ICD-10-CM | POA: Diagnosis not present

## 2017-02-07 DIAGNOSIS — Z8679 Personal history of other diseases of the circulatory system: Secondary | ICD-10-CM

## 2017-02-07 LAB — POCT INR: INR: 2.9

## 2017-02-07 MED ORDER — WARFARIN SODIUM 4 MG PO TABS: ORAL_TABLET | ORAL | 0 refills | Status: DC

## 2017-02-07 NOTE — Patient Instructions (Signed)
Description   Continue same dose 1.5 tablets everyday except 2 tablets only on Mondays and Fridays. Recheck INR in 4 weeks.

## 2017-03-07 ENCOUNTER — Ambulatory Visit (INDEPENDENT_AMBULATORY_CARE_PROVIDER_SITE_OTHER): Payer: Medicare Other | Admitting: *Deleted

## 2017-03-07 DIAGNOSIS — Z5181 Encounter for therapeutic drug level monitoring: Secondary | ICD-10-CM | POA: Diagnosis not present

## 2017-03-07 DIAGNOSIS — I059 Rheumatic mitral valve disease, unspecified: Secondary | ICD-10-CM

## 2017-03-07 LAB — POCT INR: INR: 2.5

## 2017-03-07 NOTE — Patient Instructions (Signed)
Description   Today take 2 tablets, then Continue same dose 1.5 tablets everyday except 2 tablets only on Mondays and Fridays. Recheck INR in 4 weeks.

## 2017-03-22 DIAGNOSIS — E7800 Pure hypercholesterolemia, unspecified: Secondary | ICD-10-CM | POA: Diagnosis not present

## 2017-03-22 DIAGNOSIS — N182 Chronic kidney disease, stage 2 (mild): Secondary | ICD-10-CM | POA: Diagnosis not present

## 2017-03-22 DIAGNOSIS — Z952 Presence of prosthetic heart valve: Secondary | ICD-10-CM | POA: Diagnosis not present

## 2017-03-22 DIAGNOSIS — I110 Hypertensive heart disease with heart failure: Secondary | ICD-10-CM | POA: Diagnosis not present

## 2017-03-22 DIAGNOSIS — I509 Heart failure, unspecified: Secondary | ICD-10-CM | POA: Diagnosis not present

## 2017-03-22 DIAGNOSIS — E1122 Type 2 diabetes mellitus with diabetic chronic kidney disease: Secondary | ICD-10-CM | POA: Diagnosis not present

## 2017-04-04 ENCOUNTER — Ambulatory Visit (INDEPENDENT_AMBULATORY_CARE_PROVIDER_SITE_OTHER): Payer: Medicare Other | Admitting: *Deleted

## 2017-04-04 DIAGNOSIS — Z5181 Encounter for therapeutic drug level monitoring: Secondary | ICD-10-CM | POA: Diagnosis not present

## 2017-04-04 DIAGNOSIS — Z952 Presence of prosthetic heart valve: Secondary | ICD-10-CM | POA: Diagnosis not present

## 2017-04-04 DIAGNOSIS — Z8679 Personal history of other diseases of the circulatory system: Secondary | ICD-10-CM | POA: Diagnosis not present

## 2017-04-04 DIAGNOSIS — Z954 Presence of other heart-valve replacement: Secondary | ICD-10-CM | POA: Diagnosis not present

## 2017-04-04 DIAGNOSIS — I059 Rheumatic mitral valve disease, unspecified: Secondary | ICD-10-CM

## 2017-04-04 DIAGNOSIS — Z9889 Other specified postprocedural states: Secondary | ICD-10-CM

## 2017-04-04 LAB — POCT INR: INR: 2.9

## 2017-04-04 NOTE — Patient Instructions (Signed)
Description   Continue same dose 1.5 tablets everyday except 2 tablets only on Mondays and Fridays. Recheck INR in 4 weeks.

## 2017-05-01 ENCOUNTER — Ambulatory Visit (INDEPENDENT_AMBULATORY_CARE_PROVIDER_SITE_OTHER): Payer: Medicare Other | Admitting: *Deleted

## 2017-05-01 DIAGNOSIS — Z5181 Encounter for therapeutic drug level monitoring: Secondary | ICD-10-CM | POA: Diagnosis not present

## 2017-05-01 DIAGNOSIS — I059 Rheumatic mitral valve disease, unspecified: Secondary | ICD-10-CM | POA: Diagnosis not present

## 2017-05-01 LAB — POCT INR: INR: 3.8

## 2017-05-01 NOTE — Patient Instructions (Signed)
Description   Today only take 1/2 tablet, then Continue same dose 1.5 tablets everyday except 2 tablets only on Mondays and Fridays. Recheck INR in 3 weeks.

## 2017-05-17 ENCOUNTER — Other Ambulatory Visit: Payer: Self-pay | Admitting: Interventional Cardiology

## 2017-05-22 ENCOUNTER — Ambulatory Visit (INDEPENDENT_AMBULATORY_CARE_PROVIDER_SITE_OTHER): Payer: Medicare Other | Admitting: Pharmacist

## 2017-05-22 DIAGNOSIS — I059 Rheumatic mitral valve disease, unspecified: Secondary | ICD-10-CM | POA: Diagnosis not present

## 2017-05-22 DIAGNOSIS — Z5181 Encounter for therapeutic drug level monitoring: Secondary | ICD-10-CM | POA: Diagnosis not present

## 2017-05-22 LAB — POCT INR: INR: 4

## 2017-05-22 NOTE — Patient Instructions (Signed)
Description   Skip your Coumadin today, then start taking 1.5 tablets daily except 2 tablets on Mondays. Recheck INR in 2-3 weeks.

## 2017-06-07 ENCOUNTER — Ambulatory Visit (INDEPENDENT_AMBULATORY_CARE_PROVIDER_SITE_OTHER): Payer: Medicare Other | Admitting: Pharmacist

## 2017-06-07 DIAGNOSIS — Z5181 Encounter for therapeutic drug level monitoring: Secondary | ICD-10-CM | POA: Diagnosis not present

## 2017-06-07 DIAGNOSIS — I059 Rheumatic mitral valve disease, unspecified: Secondary | ICD-10-CM

## 2017-06-07 LAB — POCT INR: INR: 2.6 (ref 2.0–3.0)

## 2017-06-07 NOTE — Patient Instructions (Signed)
Description   Continue taking 1.5 tablets daily except 2 tablets on Mondays and Fridays. Recheck INR in 3 weeks.

## 2017-06-27 ENCOUNTER — Ambulatory Visit (INDEPENDENT_AMBULATORY_CARE_PROVIDER_SITE_OTHER): Payer: Medicare Other | Admitting: *Deleted

## 2017-06-27 DIAGNOSIS — Z5181 Encounter for therapeutic drug level monitoring: Secondary | ICD-10-CM | POA: Diagnosis not present

## 2017-06-27 DIAGNOSIS — I059 Rheumatic mitral valve disease, unspecified: Secondary | ICD-10-CM | POA: Diagnosis not present

## 2017-06-27 LAB — POCT INR: INR: 3.3 — AB (ref 2.0–3.0)

## 2017-06-27 NOTE — Patient Instructions (Addendum)
Description   Continue taking 1.5 tablets daily except 2 tablets on Mondays and Fridays. Recheck INR in 4 weeks.

## 2017-07-24 ENCOUNTER — Ambulatory Visit (INDEPENDENT_AMBULATORY_CARE_PROVIDER_SITE_OTHER): Payer: Medicare Other | Admitting: *Deleted

## 2017-07-24 DIAGNOSIS — I059 Rheumatic mitral valve disease, unspecified: Secondary | ICD-10-CM | POA: Diagnosis not present

## 2017-07-24 DIAGNOSIS — Z5181 Encounter for therapeutic drug level monitoring: Secondary | ICD-10-CM

## 2017-07-24 LAB — POCT INR: INR: 3.7 — AB (ref 2.0–3.0)

## 2017-07-24 NOTE — Patient Instructions (Signed)
Description   Today take 1 tablet then continue taking 1.5 tablets daily except 2 tablets on Mondays and Fridays. Recheck INR in 3 weeks.

## 2017-08-01 NOTE — Progress Notes (Signed)
Cardiology Office Note   Date:  08/03/2017   ID:  Natalie, [name] 1955/12/03, MRN [id number]  PCP:  Lennie Odor, PA-C    No chief complaint on file.  MV replacement  Wt Readings from Last 3 Encounters:  08/03/17 226 lb (102.5 kg)  10/03/16 228 lb (103.4 kg)  05/11/16 237 lb 12.8 oz (107.9 kg)       History of Present Illness: [name] is a 62 y.o. female  who had a mitral valve repair in 2014. This was complicated by a post op CVA. She has had atrial flutter. Dr. Rayann Heman recommended amio followed by cardioversion. She had the cardioversion in 07/2012.   She is on disability at this point. She has some trouble going up stairs, and walking some distance even on flat ground.    Coumadin has been difficult to regulate.  Dose has been changed several times.  Easier to regulate now, typically 3 weeks check.   She has been working on losing weight. She lost 10 lbs in the past year.    Denies : Chest pain. Dizziness. Leg edema. Nitroglycerin use. Orthopnea. Palpitations. Paroxysmal nocturnal dyspnea. Shortness of breath. Syncope.     Past Medical History:  Diagnosis Date  . Abnormal chest CT   . Asthma   . Atrial enlargement, left    severe  . Atrial fibrillation ([place]) 01/05/2011   Chronic persistent, failed DCCV   . Atrial flutter ([place])   . Bronchospasm 1998  . Carotid bruit   . Chronic diastolic congestive heart failure ([place])   . Diabetes mellitus    insulin dependent  . Heart murmur   . History of blood transfusion   . Hypercholesterolemia   . Hypertension   . Mitral regurgitation   . Obesity   . Obesity (BMI 30-39.9) 01/16/2012  . Persistent atrial fibrillation ([place])   . Polyp of rectum   . Rheumatic fever 01/16/2012   Reported during childhood  . S/P Maze operation for atrial fibrillation 02/15/2012   Complete biatrial lesion set using bipolar radiofrequency and cryothermy ablation with clipping of LA appendage  . S/P mitral valve replacement with  metallic valve 0/09/4707   80mm Sorin [employer] Optiform mechanical prosthesis  . 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 Shortness of breath    with exertion  . Sleep apnea    DOES NOT HAVE CPAP  . Tricuspid regurgitation 01/16/2012    Past Surgical History:  Procedure Laterality Date  . CARDIAC CATHETERIZATION  >5 years  . CARDIOVASCULAR STRESS TEST  09/2010  . CARDIOVERSION  03/25/2011   Procedure: CARDIOVERSION;  Surgeon: Jettie Booze, MD;  Location: [place];  Service: Cardiovascular;  Laterality: N/A;  . CARDIOVERSION N/A 07/27/2012   Procedure: CARDIOVERSION;  Surgeon: Jettie Booze, MD;  Location: [place];  Service: Cardiovascular;  Laterality: N/A;  . COLONOSCOPY WITH PROPOFOL N/A 04/27/2016   Procedure: COLONOSCOPY WITH PROPOFOL;  Surgeon: Wilford Corner, MD;  Location: [facility] ENDOSCOPY;  Service: Endoscopy;  Laterality: N/A;  . INTRAOPERATIVE TRANSESOPHAGEAL ECHOCARDIOGRAM  02/15/2012   Procedure: INTRAOPERATIVE TRANSESOPHAGEAL ECHOCARDIOGRAM;  Surgeon: Rexene Alberts, MD;  Location: [place];  Service: Open Heart Surgery;  Laterality: N/A;  . MAZE  02/15/2012   Procedure: MAZE;  Surgeon: Rexene Alberts, MD;  Location: [place];  Service: Open Heart Surgery;  Laterality: N/A;  . MITRAL VALVE REPLACEMENT  02/15/2012   Procedure: MITRAL VALVE (MV) REPLACEMENT;  Surgeon: Braulio Conte  Keturah Barre, MD;  Location: [place];  Service: Open Heart Surgery;  Laterality: N/A;  . NO PAST SURGERIES    . TEE WITHOUT CARDIOVERSION  12/21/2011   Procedure: TRANSESOPHAGEAL ECHOCARDIOGRAM (TEE);  Surgeon: Jettie Booze, MD;  Location: [facility] ENDOSCOPY;  Service: Cardiovascular;  Laterality: N/A;  . TRICUSPID VALVE REPLACEMENT  02/15/2012   Procedure: TRICUSPID VALVE REPAIR;  Surgeon: Rexene Alberts, MD;  Location: [place];  Service: Open Heart Surgery;  Laterality: N/A;     Current Outpatient Medications  Medication Sig Dispense Refill  . albuterol (PROVENTIL  HFA;VENTOLIN HFA) 108 (90 BASE) MCG/ACT inhaler Inhale 2 puffs into the lungs every 6 (six) hours as needed. For wheezing    . amiodarone (PACERONE) 200 MG tablet TAKE 1 TABLET(200 MG) BY MOUTH DAILY 90 tablet 2  . amLODipine (NORVASC) 5 MG tablet Take 1 tablet (5 mg total) by mouth daily. 90 tablet 2  . atorvastatin (LIPITOR) 40 MG tablet Take 1 tablet (40 mg total) by mouth daily. 90 tablet 3  . calcium carbonate (OS-CAL - DOSED IN MG OF ELEMENTAL CALCIUM) 1250 MG tablet Take 1 tablet by mouth daily.      . carvedilol (COREG) 12.5 MG tablet TAKE 1 TABLET BY MOUTH TWICE DAILY WITH A MEAL 180 tablet 3  . Cholecalciferol (VITAMIN D) 1000 UNITS capsule Take 1,000 Units by mouth daily.     . cyclobenzaprine (FLEXERIL) 10 MG tablet Take 1 tablet by mouth daily as needed. Muscle spasms    . furosemide (LASIX) 40 MG tablet Take 1.5 (60 mg) by mouth twice daily    . insulin glargine (LANTUS SOLOSTAR) 100 UNIT/ML injection Inject 30-35 Units into the skin See admin instructions. Inject 30 units once daily and inject 35 units daily at bedtime.    . irbesartan (AVAPRO) 300 MG tablet TAKE 1 TABLET(300 MG) BY MOUTH DAILY 90 tablet 2  . metFORMIN (GLUCOPHAGE) 500 MG tablet Take 500 mg by mouth 2 (two) times daily with a meal.     . methocarbamol (ROBAXIN) 500 MG tablet Take 500 mg by mouth daily as needed for muscle spasms.     . Omega-3 Fatty Acids (FISH OIL) 1000 MG CAPS Take 1 capsule by mouth daily.     . potassium chloride SA (K-DUR,KLOR-CON) 20 MEQ tablet TAKE 1 TABLET(20 MEQ) BY MOUTH DAILY 90 tablet 3  . warfarin (COUMADIN) 4 MG tablet TAKE AS DIRECTED BY COUMADIN CLINIC 165 tablet 0   No current facility-administered medications for this visit.     Allergies:   Patient has no known allergies.    Social History:  The patient  reports that she has never smoked. She has never used smokeless tobacco. She reports that she does not drink alcohol or use drugs.   Family History:  The patient's family  history includes Asthma in her mother; Diabetes in her mother; Hypertension in her brother.    ROS:  Please see the history of present illness.   Otherwise, review of systems are positive for occasional edema.   All other systems are reviewed and negative.    PHYSICAL EXAM: VS:  BP 134/80   Pulse 67   Ht 5\' 6"  (1.676 m)   Wt 226 lb (102.5 kg)   SpO2 98%   BMI 36.48 kg/m  , BMI Body mass index is 36.48 kg/m. GEN: Well nourished, well developed, in no acute distress  HEENT: normal  Neck: no JVD, carotid bruits, or masses Cardiac: RRR, crisp S1, normal S2;  2/6 systolic murmurs, no rubs, or gallops,no edema  Respiratory:  clear to auscultation bilaterally, normal work of breathing GI: soft, nontender, nondistended, + BS MS: no deformity or atrophy ; prominent LE veins Skin: warm and dry, no rash Neuro:  Strength and sensation are intact Psych: euthymic mood, full affect   EKG:   The ekg ordered 9/18 demonstrates NSR, LBBB   Recent Labs: 10/03/2016: ALT 19; BUN 14; Creatinine, Ser 0.87; Potassium 4.2; Sodium 140; TSH 2.940   Lipid Panel    Component Value Date/Time   CHOL 152 05/28/2015 0958   TRIG 108 05/28/2015 0958   HDL 35 (L) 05/28/2015 0958   CHOLHDL 4.3 05/28/2015 0958   VLDL 22 05/28/2015 0958   LDLCALC 95 05/28/2015 0958     Other studies Reviewed: Additional studies/ records that were reviewed today with results demonstrating: 2018 echo; labs reviewed.   ASSESSMENT AND PLAN:  1. Mitral valve disorder: s/p valve replacement. Doing well.  Valve appears to be functioning well.  2. Chronic systolic heart failure: Appears euvolemic.   3. Atrial flutter/Afib: s/p Maze procedure: in NSR oin [place].  Will have labs checked in 9/19 with PMD. She needs labs checked every 6 months, electrolytes, Liver, and thyroid.  4. HTN: Well controlled. COntinue current meds.   5. SHOB: Worse if it is very hot outside.  6. DM: LDL target < 100.  LDL 78 in 3/19.    Current  medicines are reviewed at length with the patient today.  The patient concerns regarding her medicines were addressed.  The following changes have been made:  No change  Labs/ tests ordered today include:  No orders of the defined types were placed in this encounter.   Recommend 150 minutes/week of aerobic exercise Low fat, low carb, high fiber diet recommended  Disposition:   FU in 1 year   Signed, Larae Grooms, MD  08/03/2017 8:35 AM    [facility] [address], [place], [place]  [address] Phone: [phone]; Fax: [phone]

## 2017-08-03 ENCOUNTER — Encounter: Payer: Self-pay | Admitting: Interventional Cardiology

## 2017-08-03 ENCOUNTER — Other Ambulatory Visit: Payer: Self-pay | Admitting: *Deleted

## 2017-08-03 ENCOUNTER — Ambulatory Visit (INDEPENDENT_AMBULATORY_CARE_PROVIDER_SITE_OTHER): Payer: Medicare Other | Admitting: Interventional Cardiology

## 2017-08-03 VITALS — BP 134/80 | HR 67 | Ht 66.0 in | Wt 226.0 lb

## 2017-08-03 DIAGNOSIS — Z8679 Personal history of other diseases of the circulatory system: Secondary | ICD-10-CM | POA: Diagnosis not present

## 2017-08-03 DIAGNOSIS — I059 Rheumatic mitral valve disease, unspecified: Secondary | ICD-10-CM

## 2017-08-03 DIAGNOSIS — I519 Heart disease, unspecified: Secondary | ICD-10-CM

## 2017-08-03 DIAGNOSIS — Z9889 Other specified postprocedural states: Secondary | ICD-10-CM | POA: Diagnosis not present

## 2017-08-03 DIAGNOSIS — Z954 Presence of other heart-valve replacement: Secondary | ICD-10-CM | POA: Diagnosis not present

## 2017-08-03 DIAGNOSIS — I481 Persistent atrial fibrillation: Secondary | ICD-10-CM

## 2017-08-03 DIAGNOSIS — I4819 Other persistent atrial fibrillation: Secondary | ICD-10-CM

## 2017-08-03 MED ORDER — ATORVASTATIN CALCIUM 40 MG PO TABS: 40.0000 mg | ORAL_TABLET | Freq: Every day | ORAL | 3 refills | Status: DC

## 2017-08-03 MED ORDER — CARVEDILOL 12.5 MG PO TABS: ORAL_TABLET | ORAL | 3 refills | Status: DC

## 2017-08-03 MED ORDER — AMLODIPINE BESYLATE 5 MG PO TABS: 5.0000 mg | ORAL_TABLET | Freq: Every day | ORAL | 3 refills | Status: DC

## 2017-08-03 MED ORDER — IRBESARTAN 300 MG PO TABS: ORAL_TABLET | ORAL | 3 refills | Status: DC

## 2017-08-03 MED ORDER — POTASSIUM CHLORIDE CRYS ER 20 MEQ PO TBCR: EXTENDED_RELEASE_TABLET | ORAL | 3 refills | Status: DC

## 2017-08-03 MED ORDER — AMIODARONE HCL 200 MG PO TABS: ORAL_TABLET | ORAL | 3 refills | Status: DC

## 2017-08-03 MED ORDER — FUROSEMIDE 40 MG PO TABS: 60.0000 mg | ORAL_TABLET | Freq: Two times a day (BID) | ORAL | 3 refills | Status: DC

## 2017-08-03 MED ORDER — WARFARIN SODIUM 4 MG PO TABS: ORAL_TABLET | ORAL | 1 refills | Status: DC

## 2017-08-03 NOTE — Patient Instructions (Signed)

## 2017-08-14 ENCOUNTER — Ambulatory Visit (INDEPENDENT_AMBULATORY_CARE_PROVIDER_SITE_OTHER): Payer: Medicare Other | Admitting: *Deleted

## 2017-08-14 DIAGNOSIS — I059 Rheumatic mitral valve disease, unspecified: Secondary | ICD-10-CM | POA: Diagnosis not present

## 2017-08-14 DIAGNOSIS — Z5181 Encounter for therapeutic drug level monitoring: Secondary | ICD-10-CM

## 2017-08-14 LAB — POCT INR: INR: 5.4 — AB (ref 2.0–3.0)

## 2017-08-14 NOTE — Patient Instructions (Addendum)
Description   Hold today and tomorrow's dose of Coumadin then start taking 1.5 tablets daily except 2 tablets on Fridays. Recheck INR in 2 weeks.

## 2017-08-28 ENCOUNTER — Ambulatory Visit (INDEPENDENT_AMBULATORY_CARE_PROVIDER_SITE_OTHER): Payer: Medicare Other | Admitting: *Deleted

## 2017-08-28 DIAGNOSIS — I059 Rheumatic mitral valve disease, unspecified: Secondary | ICD-10-CM | POA: Diagnosis not present

## 2017-08-28 DIAGNOSIS — Z5181 Encounter for therapeutic drug level monitoring: Secondary | ICD-10-CM

## 2017-08-28 LAB — POCT INR: INR: 3.2 — AB (ref 2.0–3.0)

## 2017-08-28 NOTE — Patient Instructions (Signed)
Description   Continue taking 1.5 tablets daily except 2 tablets on Fridays. Recheck INR in 3 weeks.

## 2017-09-18 ENCOUNTER — Ambulatory Visit (INDEPENDENT_AMBULATORY_CARE_PROVIDER_SITE_OTHER): Payer: Medicare Other

## 2017-09-18 DIAGNOSIS — Z5181 Encounter for therapeutic drug level monitoring: Secondary | ICD-10-CM | POA: Diagnosis not present

## 2017-09-18 DIAGNOSIS — I059 Rheumatic mitral valve disease, unspecified: Secondary | ICD-10-CM | POA: Diagnosis not present

## 2017-09-18 LAB — POCT INR: INR: 3.2 — AB (ref 2.0–3.0)

## 2017-09-18 NOTE — Patient Instructions (Signed)
Description   Continue on same dosage 1.5 tablets daily except 2 tablets on Fridays. Recheck INR in 4 weeks.

## 2017-09-20 ENCOUNTER — Other Ambulatory Visit: Payer: Self-pay | Admitting: Interventional Cardiology

## 2017-09-20 NOTE — Telephone Encounter (Signed)
Outpatient Medication Detail    Disp Refills Start End   amLODipine (NORVASC) 5 MG tablet 90 tablet 3 08/03/2017    Sig - Route: Take 1 tablet (5 mg total) by mouth daily. - Oral   Sent to pharmacy as: amLODipine (NORVASC) 5 MG tablet   E-Prescribing Status: Receipt confirmed by pharmacy (08/03/2017 8:51 AM EDT)   [place], [place]

## 2017-09-27 ENCOUNTER — Other Ambulatory Visit: Payer: Self-pay | Admitting: Physician Assistant

## 2017-09-27 DIAGNOSIS — E7800 Pure hypercholesterolemia, unspecified: Secondary | ICD-10-CM | POA: Diagnosis not present

## 2017-09-27 DIAGNOSIS — Z1231 Encounter for screening mammogram for malignant neoplasm of breast: Secondary | ICD-10-CM

## 2017-09-27 DIAGNOSIS — Z0000 Encounter for general adult medical examination without abnormal findings: Secondary | ICD-10-CM | POA: Diagnosis not present

## 2017-09-27 DIAGNOSIS — I110 Hypertensive heart disease with heart failure: Secondary | ICD-10-CM | POA: Diagnosis not present

## 2017-09-27 DIAGNOSIS — Z6837 Body mass index (BMI) 37.0-37.9, adult: Secondary | ICD-10-CM | POA: Diagnosis not present

## 2017-09-27 DIAGNOSIS — Z794 Long term (current) use of insulin: Secondary | ICD-10-CM | POA: Diagnosis not present

## 2017-09-27 DIAGNOSIS — Z952 Presence of prosthetic heart valve: Secondary | ICD-10-CM | POA: Diagnosis not present

## 2017-09-27 DIAGNOSIS — E1169 Type 2 diabetes mellitus with other specified complication: Secondary | ICD-10-CM | POA: Diagnosis not present

## 2017-09-27 DIAGNOSIS — N182 Chronic kidney disease, stage 2 (mild): Secondary | ICD-10-CM | POA: Diagnosis not present

## 2017-09-27 DIAGNOSIS — I509 Heart failure, unspecified: Secondary | ICD-10-CM | POA: Diagnosis not present

## 2017-09-27 DIAGNOSIS — Z23 Encounter for immunization: Secondary | ICD-10-CM | POA: Diagnosis not present

## 2017-09-27 DIAGNOSIS — I4891 Unspecified atrial fibrillation: Secondary | ICD-10-CM | POA: Diagnosis not present

## 2017-10-16 ENCOUNTER — Ambulatory Visit (INDEPENDENT_AMBULATORY_CARE_PROVIDER_SITE_OTHER): Payer: Medicare Other | Admitting: *Deleted

## 2017-10-16 ENCOUNTER — Encounter (INDEPENDENT_AMBULATORY_CARE_PROVIDER_SITE_OTHER): Payer: Self-pay

## 2017-10-16 DIAGNOSIS — I059 Rheumatic mitral valve disease, unspecified: Secondary | ICD-10-CM

## 2017-10-16 DIAGNOSIS — Z5181 Encounter for therapeutic drug level monitoring: Secondary | ICD-10-CM

## 2017-10-16 LAB — POCT INR: INR: 3.9 — AB (ref 2.0–3.0)

## 2017-10-16 NOTE — Patient Instructions (Addendum)
Description   Do not take today's dose then continue on same dosage 1.5 tablets daily except 2 tablets on Fridays. Recheck INR in 3 weeks.

## 2017-10-31 ENCOUNTER — Ambulatory Visit
Admission: RE | Admit: 2017-10-31 | Discharge: 2017-10-31 | Disposition: A | Discharge status: Home / Self Care | Payer: Medicare Other | Source: Ambulatory Visit | Attending: Physician Assistant | Admitting: Physician Assistant

## 2017-10-31 DIAGNOSIS — Z1231 Encounter for screening mammogram for malignant neoplasm of breast: Secondary | ICD-10-CM

## 2017-11-07 ENCOUNTER — Ambulatory Visit (INDEPENDENT_AMBULATORY_CARE_PROVIDER_SITE_OTHER): Payer: Medicare Other | Admitting: *Deleted

## 2017-11-07 DIAGNOSIS — I059 Rheumatic mitral valve disease, unspecified: Secondary | ICD-10-CM | POA: Diagnosis not present

## 2017-11-07 DIAGNOSIS — Z5181 Encounter for therapeutic drug level monitoring: Secondary | ICD-10-CM | POA: Diagnosis not present

## 2017-11-07 LAB — POCT INR: INR: 3.3 — AB (ref 2.0–3.0)

## 2017-11-07 NOTE — Patient Instructions (Signed)
Description   Continue on same dosage 1.5 tablets daily except 2 tablets on Fridays. Recheck INR in 4 weeks.

## 2017-12-04 ENCOUNTER — Ambulatory Visit (INDEPENDENT_AMBULATORY_CARE_PROVIDER_SITE_OTHER): Payer: Medicare Other | Admitting: Pharmacist

## 2017-12-04 DIAGNOSIS — I059 Rheumatic mitral valve disease, unspecified: Secondary | ICD-10-CM | POA: Diagnosis not present

## 2017-12-04 DIAGNOSIS — Z5181 Encounter for therapeutic drug level monitoring: Secondary | ICD-10-CM | POA: Diagnosis not present

## 2017-12-04 LAB — POCT INR: INR: 4.9 — AB (ref 2.0–3.0)

## 2017-12-04 NOTE — Patient Instructions (Signed)
Description   Skip dose today and tomorrow then continue on same dosage 1.5 tablets daily except 2 tablets on Fridays. Recheck INR in 10 days.

## 2017-12-15 ENCOUNTER — Ambulatory Visit (INDEPENDENT_AMBULATORY_CARE_PROVIDER_SITE_OTHER): Payer: Medicare Other | Admitting: *Deleted

## 2017-12-15 DIAGNOSIS — Z5181 Encounter for therapeutic drug level monitoring: Secondary | ICD-10-CM | POA: Diagnosis not present

## 2017-12-15 DIAGNOSIS — I059 Rheumatic mitral valve disease, unspecified: Secondary | ICD-10-CM | POA: Diagnosis not present

## 2017-12-15 LAB — POCT INR: INR: 2.4 (ref 2.0–3.0)

## 2017-12-15 NOTE — Patient Instructions (Signed)
Description   Today take 2.5 tablets, then  then continue on same dosage 1.5 tablets daily except 2 tablets on Fridays. Recheck INR in 10 days.

## 2017-12-25 ENCOUNTER — Ambulatory Visit (INDEPENDENT_AMBULATORY_CARE_PROVIDER_SITE_OTHER): Payer: Medicare Other | Admitting: *Deleted

## 2017-12-25 DIAGNOSIS — Z5181 Encounter for therapeutic drug level monitoring: Secondary | ICD-10-CM

## 2017-12-25 DIAGNOSIS — I059 Rheumatic mitral valve disease, unspecified: Secondary | ICD-10-CM | POA: Diagnosis not present

## 2017-12-25 LAB — POCT INR: INR: 3.2 — AB (ref 2.0–3.0)

## 2017-12-25 NOTE — Patient Instructions (Signed)
Description   Conntinue on same dosage 1.5 tablets daily except 2 tablets on Fridays. Recheck INR in 3 weeks.

## 2018-01-09 ENCOUNTER — Encounter (INDEPENDENT_AMBULATORY_CARE_PROVIDER_SITE_OTHER): Payer: Self-pay

## 2018-01-09 ENCOUNTER — Ambulatory Visit (INDEPENDENT_AMBULATORY_CARE_PROVIDER_SITE_OTHER): Payer: Medicare Other | Admitting: *Deleted

## 2018-01-09 DIAGNOSIS — Z5181 Encounter for therapeutic drug level monitoring: Secondary | ICD-10-CM | POA: Diagnosis not present

## 2018-01-09 DIAGNOSIS — I059 Rheumatic mitral valve disease, unspecified: Secondary | ICD-10-CM | POA: Diagnosis not present

## 2018-01-09 LAB — POCT INR: INR: 4.8 — AB (ref 2.0–3.0)

## 2018-01-09 NOTE — Patient Instructions (Addendum)
Description   Skip today's dose, then start taking 1.5 tablets daily.  Recheck INR in 2 weeks. Call us with any medication changes or concerns to Coumadin Clinic @ [phone].

## 2018-01-26 ENCOUNTER — Ambulatory Visit (INDEPENDENT_AMBULATORY_CARE_PROVIDER_SITE_OTHER): Payer: Medicare HMO | Admitting: Pharmacist

## 2018-01-26 DIAGNOSIS — Z5181 Encounter for therapeutic drug level monitoring: Secondary | ICD-10-CM | POA: Diagnosis not present

## 2018-01-26 DIAGNOSIS — I059 Rheumatic mitral valve disease, unspecified: Secondary | ICD-10-CM

## 2018-01-26 LAB — POCT INR: INR: 2.6 (ref 2.0–3.0)

## 2018-01-26 NOTE — Patient Instructions (Signed)
Continue taking 1.5 tablets daily.  Recheck INR in 2-3 weeks. Call us with any medication changes or concerns to Coumadin Clinic @ [phone].

## 2018-02-14 ENCOUNTER — Ambulatory Visit (INDEPENDENT_AMBULATORY_CARE_PROVIDER_SITE_OTHER): Payer: Medicare HMO | Admitting: *Deleted

## 2018-02-14 DIAGNOSIS — I059 Rheumatic mitral valve disease, unspecified: Secondary | ICD-10-CM | POA: Diagnosis not present

## 2018-02-14 DIAGNOSIS — Z5181 Encounter for therapeutic drug level monitoring: Secondary | ICD-10-CM

## 2018-02-14 LAB — POCT INR: INR: 2.9 (ref 2.0–3.0)

## 2018-02-14 NOTE — Patient Instructions (Signed)
Description   Continue taking 1.5 tablets daily.  Recheck INR in 4 weeks. Call us with any medication changes or concerns to Coumadin Clinic @ [phone].

## 2018-03-14 ENCOUNTER — Ambulatory Visit (INDEPENDENT_AMBULATORY_CARE_PROVIDER_SITE_OTHER): Payer: Medicare HMO | Admitting: *Deleted

## 2018-03-14 DIAGNOSIS — I059 Rheumatic mitral valve disease, unspecified: Secondary | ICD-10-CM | POA: Diagnosis not present

## 2018-03-14 DIAGNOSIS — Z5181 Encounter for therapeutic drug level monitoring: Secondary | ICD-10-CM | POA: Diagnosis not present

## 2018-03-14 LAB — POCT INR: INR: 2.1 (ref 2.0–3.0)

## 2018-03-14 NOTE — Patient Instructions (Signed)
Description   Today take 2 tablets then continue taking 1.5 tablets daily.  Recheck INR in 3 weeks. Call us with any medication changes or concerns to Coumadin Clinic @ [phone].

## 2018-03-23 ENCOUNTER — Other Ambulatory Visit: Payer: Self-pay | Admitting: Interventional Cardiology

## 2018-04-03 ENCOUNTER — Telehealth: Payer: Self-pay

## 2018-04-03 NOTE — Telephone Encounter (Signed)
LMOM FOR PRESCREEN/DRIVE THRU 

## 2018-04-04 NOTE — Telephone Encounter (Signed)
Made second attempt to call pt [name]

## 2018-04-05 ENCOUNTER — Other Ambulatory Visit: Payer: Self-pay

## 2018-04-05 ENCOUNTER — Ambulatory Visit (INDEPENDENT_AMBULATORY_CARE_PROVIDER_SITE_OTHER): Payer: Medicare HMO | Admitting: Pharmacist

## 2018-04-05 DIAGNOSIS — Z5181 Encounter for therapeutic drug level monitoring: Secondary | ICD-10-CM | POA: Diagnosis not present

## 2018-04-05 DIAGNOSIS — I059 Rheumatic mitral valve disease, unspecified: Secondary | ICD-10-CM

## 2018-04-05 LAB — POCT INR: INR: 3.6 — AB (ref 2.0–3.0)

## 2018-04-05 NOTE — Patient Instructions (Signed)
You may eat a serving of greens today. Continue taking 1.5 tablets daily.  Recheck INR in 5 weeks. Call us with any medication changes or concerns to Coumadin Clinic @ [phone].

## 2018-05-11 ENCOUNTER — Telehealth: Payer: Self-pay

## 2018-05-11 NOTE — Telephone Encounter (Signed)
lmom for prescreen  

## 2018-05-14 ENCOUNTER — Ambulatory Visit (INDEPENDENT_AMBULATORY_CARE_PROVIDER_SITE_OTHER): Payer: Medicare HMO | Admitting: Pharmacist

## 2018-05-14 DIAGNOSIS — Z5181 Encounter for therapeutic drug level monitoring: Secondary | ICD-10-CM

## 2018-05-14 DIAGNOSIS — I059 Rheumatic mitral valve disease, unspecified: Secondary | ICD-10-CM

## 2018-05-14 LAB — POCT INR: INR: 4.3 — AB (ref 2.0–3.0)

## 2018-06-01 ENCOUNTER — Telehealth: Payer: Self-pay

## 2018-06-01 NOTE — Telephone Encounter (Signed)
Unable to lmom for prescreen bc mailbox is full

## 2018-06-05 ENCOUNTER — Other Ambulatory Visit: Payer: Self-pay

## 2018-06-05 ENCOUNTER — Ambulatory Visit (INDEPENDENT_AMBULATORY_CARE_PROVIDER_SITE_OTHER): Payer: 59 | Admitting: *Deleted

## 2018-06-05 DIAGNOSIS — I059 Rheumatic mitral valve disease, unspecified: Secondary | ICD-10-CM

## 2018-06-05 DIAGNOSIS — Z5181 Encounter for therapeutic drug level monitoring: Secondary | ICD-10-CM | POA: Diagnosis not present

## 2018-06-05 LAB — POCT INR: INR: 4.4 — AB (ref 2.0–3.0)

## 2018-06-05 NOTE — Patient Instructions (Addendum)
Description   Spoke with pt and instructed to hold today's dose, then start taking 1.5 tablets daily except 1 tablet on Sundays. Recheck INR in 3 weeks. Call us with any medication changes or concerns to Coumadin Clinic @ [phone].

## 2018-06-20 ENCOUNTER — Telehealth: Payer: Self-pay

## 2018-06-20 NOTE — Telephone Encounter (Signed)
Unable to lmom vm full

## 2018-06-26 ENCOUNTER — Ambulatory Visit (INDEPENDENT_AMBULATORY_CARE_PROVIDER_SITE_OTHER): Payer: Medicare HMO | Admitting: *Deleted

## 2018-06-26 ENCOUNTER — Other Ambulatory Visit: Payer: Self-pay

## 2018-06-26 DIAGNOSIS — I059 Rheumatic mitral valve disease, unspecified: Secondary | ICD-10-CM

## 2018-06-26 DIAGNOSIS — Z5181 Encounter for therapeutic drug level monitoring: Secondary | ICD-10-CM | POA: Diagnosis not present

## 2018-06-26 LAB — POCT INR: INR: 3.2 — AB (ref 2.0–3.0)

## 2018-06-26 NOTE — Patient Instructions (Signed)
Description   Continue taking 1.5 tablets daily except 1 tablet on Sundays. Recheck INR in 4 weeks. Call us with any medication changes or concerns to Coumadin Clinic @ [phone].

## 2018-07-16 ENCOUNTER — Telehealth: Payer: Self-pay

## 2018-07-16 NOTE — Telephone Encounter (Signed)
Unable to lmom for prescreen  

## 2018-07-23 ENCOUNTER — Other Ambulatory Visit: Payer: Self-pay

## 2018-07-23 ENCOUNTER — Ambulatory Visit (INDEPENDENT_AMBULATORY_CARE_PROVIDER_SITE_OTHER): Payer: Medicare HMO | Admitting: *Deleted

## 2018-07-23 DIAGNOSIS — I059 Rheumatic mitral valve disease, unspecified: Secondary | ICD-10-CM | POA: Diagnosis not present

## 2018-07-23 DIAGNOSIS — Z5181 Encounter for therapeutic drug level monitoring: Secondary | ICD-10-CM | POA: Diagnosis not present

## 2018-07-23 LAB — POCT INR: INR: 4.1 — AB (ref 2.0–3.0)

## 2018-07-23 NOTE — Patient Instructions (Signed)
Description   Do not take any Coumadin today then continue taking 1.5 tablets daily except 1 tablet on Sundays. Recheck INR in 3 weeks. Call us with any medication changes or concerns to Coumadin Clinic @ [phone].

## 2018-08-11 DEATH — deceased

## 2018-08-15 ENCOUNTER — Ambulatory Visit (INDEPENDENT_AMBULATORY_CARE_PROVIDER_SITE_OTHER): Payer: Medicare HMO | Admitting: *Deleted

## 2018-08-15 ENCOUNTER — Other Ambulatory Visit: Payer: Self-pay

## 2018-08-15 DIAGNOSIS — Z5181 Encounter for therapeutic drug level monitoring: Secondary | ICD-10-CM | POA: Diagnosis not present

## 2018-08-15 DIAGNOSIS — I059 Rheumatic mitral valve disease, unspecified: Secondary | ICD-10-CM | POA: Diagnosis not present

## 2018-08-15 LAB — POCT INR: INR: 3.5 — AB (ref 2.0–3.0)

## 2018-08-15 NOTE — Patient Instructions (Signed)
Description   Continue taking 1.5 tablets daily except 1 tablet on Sundays. Recheck INR in 4 weeks. Call us with any medication changes or concerns to Coumadin Clinic @ [phone].

## 2018-08-21 ENCOUNTER — Other Ambulatory Visit: Payer: Self-pay | Admitting: Interventional Cardiology

## 2018-09-11 ENCOUNTER — Other Ambulatory Visit: Payer: Self-pay | Admitting: Interventional Cardiology

## 2018-09-11 MED ORDER — WARFARIN SODIUM 4 MG PO TABS: ORAL_TABLET | ORAL | 1 refills | Status: DC

## 2018-09-14 ENCOUNTER — Ambulatory Visit (INDEPENDENT_AMBULATORY_CARE_PROVIDER_SITE_OTHER): Payer: Medicare HMO | Admitting: *Deleted

## 2018-09-14 ENCOUNTER — Ambulatory Visit (INDEPENDENT_AMBULATORY_CARE_PROVIDER_SITE_OTHER): Payer: Medicare HMO | Admitting: Interventional Cardiology

## 2018-09-14 ENCOUNTER — Other Ambulatory Visit: Payer: Self-pay

## 2018-09-14 ENCOUNTER — Encounter: Payer: Self-pay | Admitting: Interventional Cardiology

## 2018-09-14 VITALS — BP 156/102 | HR 67 | Ht 66.0 in | Wt 234.4 lb

## 2018-09-14 DIAGNOSIS — I498 Other specified cardiac arrhythmias: Secondary | ICD-10-CM

## 2018-09-14 DIAGNOSIS — I447 Left bundle-branch block, unspecified: Secondary | ICD-10-CM

## 2018-09-14 DIAGNOSIS — I480 Paroxysmal atrial fibrillation: Secondary | ICD-10-CM | POA: Diagnosis not present

## 2018-09-14 DIAGNOSIS — I059 Rheumatic mitral valve disease, unspecified: Secondary | ICD-10-CM

## 2018-09-14 DIAGNOSIS — Z5181 Encounter for therapeutic drug level monitoring: Secondary | ICD-10-CM

## 2018-09-14 DIAGNOSIS — I519 Heart disease, unspecified: Secondary | ICD-10-CM | POA: Diagnosis not present

## 2018-09-14 DIAGNOSIS — Z952 Presence of prosthetic heart valve: Secondary | ICD-10-CM

## 2018-09-14 HISTORY — DX: Left bundle-branch block, unspecified: I44.7

## 2018-09-14 HISTORY — DX: Other specified cardiac arrhythmias: I49.8

## 2018-09-14 LAB — POCT INR: INR: 3.3 — AB (ref 2.0–3.0)

## 2018-09-14 NOTE — Patient Instructions (Signed)
Description   Continue taking 1.5 tablets daily except 1 tablet on Sundays. Recheck INR in 5 weeks. Call us with any medication changes or concerns to Coumadin Clinic @ [phone].

## 2018-09-14 NOTE — Patient Instructions (Addendum)
Medication Instructions:  Your physician recommends that you continue on your current medications as directed. Please refer to the Current Medication list given to you today.  If you need a refill on your cardiac medications before your next appointment, please call your pharmacy.   Lab work: None Ordered  If you have labs (blood work) drawn today and your tests are completely normal, you will receive your results only by: [name] MyChart Message (if you have MyChart) OR . A paper copy in the mail If you have any lab test that is abnormal or we need to change your treatment, we will call you to review the results.  Testing/Procedures: None ordered  Follow-Up: At [facility], you and your health needs are our priority.  As part of our continuing mission to provide you with exceptional heart care, we have created designated Provider Care Teams.  These Care Teams include your primary Cardiologist (physician) and Advanced Practice Providers (APPs -  Physician Assistants and Nurse Practitioners) who all work together to provide you with the care you need, when you need it. . You will need a follow up appointment in 1 year.  Please call our office 2 months in advance to schedule this appointment.  You may see Casandra Doffing, MD or one of the following Advanced Practice Providers on your designated Care Team:   . Lyda Jester, PA-C . Dayna Dunn, PA-C . Ermalinda Barrios, PA-C  Any Other Special Instructions Will Be Listed Below (If Applicable).   Heart-Healthy Eating Plan Heart-healthy meal planning includes:  Eating less unhealthy fats.  Eating more healthy fats.  Making other changes in your diet. Talk with your doctor or a diet specialist (dietitian) to create an eating plan that is right for you. What is my plan? Your doctor may recommend an eating plan that includes:  Total fat: ______% or less of total calories a day.  Saturated fat: ______% or less of total calories a  day.  Cholesterol: less than _________mg a day. What are tips for following this plan? Cooking Avoid frying your food. Try to bake, boil, grill, or broil it instead. You can also reduce fat by:  Removing the skin from poultry.  Removing all visible fats from meats.  Steaming vegetables in water or broth. Meal planning   At meals, divide your plate into four equal parts: ? Fill one-half of your plate with vegetables and green salads. ? Fill one-fourth of your plate with whole grains. ? Fill one-fourth of your plate with lean protein foods.  Eat 4-5 servings of vegetables per day. A serving of vegetables is: ? 1 cup of raw or cooked vegetables. ? 2 cups of raw leafy greens.  Eat 4-5 servings of fruit per day. A serving of fruit is: ? 1 medium whole fruit. ?  cup of dried fruit. ?  cup of fresh, frozen, or canned fruit. ?  cup of 100% fruit juice.  Eat more foods that have soluble fiber. These are apples, broccoli, carrots, beans, peas, and barley. Try to get 20-30 g of fiber per day.  Eat 4-5 servings of nuts, legumes, and seeds per week: ? 1 serving of dried beans or legumes equals  cup after being cooked. ? 1 serving of nuts is  cup. ? 1 serving of seeds equals 1 tablespoon. General information  Eat more home-cooked food. Eat less restaurant, buffet, and fast food.  Limit or avoid alcohol.  Limit foods that are high in starch and sugar.  Avoid fried  foods.  Lose weight if you are overweight.  Keep track of how much salt (sodium) you eat. This is important if you have high blood pressure. Ask your doctor to tell you more about this.  Try to add vegetarian meals each week. Fats  Choose healthy fats. These include olive oil and canola oil, flaxseeds, walnuts, almonds, and seeds.  Eat more omega-3 fats. These include salmon, mackerel, sardines, tuna, flaxseed oil, and ground flaxseeds. Try to eat fish at least 2 times each week.  Check food labels. Avoid  foods with trans fats or high amounts of saturated fat.  Limit saturated fats. ? These are often found in animal products, such as meats, butter, and cream. ? These are also found in plant foods, such as palm oil, palm kernel oil, and coconut oil.  Avoid foods with partially hydrogenated oils in them. These have trans fats. Examples are stick margarine, some tub margarines, cookies, crackers, and other baked goods. What foods can I eat? Fruits All fresh, canned (in natural juice), or frozen fruits. Vegetables Fresh or frozen vegetables (raw, steamed, roasted, or grilled). Green salads. Grains Most grains. Choose whole wheat and whole grains most of the time. Rice and pasta, including brown rice and pastas made with whole wheat. Meats and other proteins Lean, well-trimmed beef, veal, pork, and lamb. Chicken and [place] without skin. All fish and shellfish. Wild duck, rabbit, pheasant, and venison. Egg whites or low-cholesterol egg substitutes. Dried beans, peas, lentils, and tofu. Seeds and most nuts. Dairy Low-fat or nonfat cheeses, including ricotta and mozzarella. Skim or 1% milk that is liquid, powdered, or evaporated. Buttermilk that is made with low-fat milk. Nonfat or low-fat yogurt. Fats and oils Non-hydrogenated (trans-free) margarines. Vegetable oils, including soybean, sesame, sunflower, olive, peanut, safflower, corn, canola, and cottonseed. Salad dressings or mayonnaise made with a vegetable oil. Beverages Mineral water. Coffee and tea. Diet carbonated beverages. Sweets and desserts Sherbet, gelatin, and fruit ice. Small amounts of dark chocolate. Limit all sweets and desserts. Seasonings and condiments All seasonings and condiments. The items listed above may not be a complete list of foods and drinks you can eat. Contact a dietitian for more options. What foods should I avoid? Fruits Canned fruit in heavy syrup. Fruit in cream or butter sauce. Fried fruit. Limit  coconut. Vegetables Vegetables cooked in cheese, cream, or butter sauce. Fried vegetables. Grains Breads that are made with saturated or trans fats, oils, or whole milk. Croissants. Sweet rolls. Donuts. High-fat crackers, such as cheese crackers. Meats and other proteins Fatty meats, such as hot dogs, ribs, sausage, bacon, rib-eye roast or steak. High-fat deli meats, such as salami and bologna. Caviar. Domestic duck and goose. Organ meats, such as liver. Dairy Cream, sour cream, cream cheese, and creamed cottage cheese. Whole-milk cheeses. Whole or 2% milk that is liquid, evaporated, or condensed. Whole buttermilk. Cream sauce or high-fat cheese sauce. Yogurt that is made from whole milk. Fats and oils Meat fat, or shortening. Cocoa butter, hydrogenated oils, palm oil, coconut oil, palm kernel oil. Solid fats and shortenings, including bacon fat, salt pork, lard, and butter. Nondairy cream substitutes. Salad dressings with cheese or sour cream. Beverages Regular sodas and juice drinks with added sugar. Sweets and desserts Frosting. Pudding. Cookies. Cakes. Pies. Milk chocolate or white chocolate. Buttered syrups. Full-fat ice cream or ice cream drinks. The items listed above may not be a complete list of foods and drinks to avoid. Contact a dietitian for more information. Summary  Heart-healthy meal  planning includes eating less unhealthy fats, eating more healthy fats, and making other changes in your diet.  Eat a balanced diet. This includes fruits and vegetables, low-fat or nonfat dairy, lean protein, nuts and legumes, whole grains, and heart-healthy oils and fats. This information is not intended to replace advice given to you by your health care provider. Make sure you discuss any questions you have with your health care provider. Document Released: 06/28/2011 Document Revised: 03/02/2017 Document Reviewed: 02/03/2017 Elsevier Patient Education  2020 [employer].

## 2018-09-14 NOTE — Progress Notes (Signed)
Cardiology Office Note   Date:  09/14/2018   ID:  [name], [name] 04/30/1955, MRN [id number]  PCP:  Lennie Odor, PA-C    No chief complaint on file.  MVR  Wt Readings from Last 3 Encounters:  09/14/18 234 lb 6.4 oz (106.3 kg)  08/03/17 226 lb (102.5 kg)  10/03/16 228 lb (103.4 kg)       History of Present Illness: [name] is a 63 y.o. female  who had a mitral valve replacement and TV repairin 2014. This was complicated by a post op CVA. She has had atrial flutter. Dr. Rayann Heman recommended amio followed by cardioversion. She had the cardioversion in 07/2012.   She is on disability at this point. She has some trouble going up stairs, and walking some distance even on flat ground.   Minimal walking at this point.  Tries to use waling for exercise.  No other exercise equipment.    Coumadin hadbeen difficult to regulate.  Easier to regulate now, typically 3-5 weeks check.  Denies : Chest pain. Dizziness. Nitroglycerin use. Orthopnea. Palpitations. Paroxysmal nocturnal dyspnea. Syncope.   Some DOE.  Also some LE edema.  Does not eat healthy and has not been exercising.    Past Medical History:  Diagnosis Date  . Abnormal chest CT   . Asthma   . Atrial enlargement, left    severe  . Atrial fibrillation ([place]) 01/05/2011   Chronic persistent, failed DCCV   . Atrial flutter ([place])   . Bronchospasm 1998  . Carotid bruit   . Chronic diastolic congestive heart failure ([address])   . Diabetes mellitus    insulin dependent  . Heart murmur   . History of blood transfusion   . Hypercholesterolemia   . Hypertension   . Mitral regurgitation   . Obesity   . Obesity (BMI 30-39.9) 01/16/2012  . Persistent atrial fibrillation   . Polyp of rectum   . Rheumatic fever 01/16/2012   Reported during childhood  . S/P Maze operation for atrial fibrillation 02/15/2012   Complete biatrial lesion set using bipolar radiofrequency and cryothermy ablation with clipping of LA  appendage  . S/P mitral valve replacement with metallic valve [id number]   20mm Sorin Carbomedics Optiform mechanical prosthesis  . 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 Shortness of breath    with exertion  . Sleep apnea    DOES NOT HAVE CPAP  . Tricuspid regurgitation 01/16/2012    Past Surgical History:  Procedure Laterality Date  . CARDIAC CATHETERIZATION  >5 years  . CARDIOVASCULAR STRESS TEST  09/2010  . CARDIOVERSION  03/25/2011   Procedure: CARDIOVERSION;  Surgeon: Jettie Booze, MD;  Location: [facility];  Service: Cardiovascular;  Laterality: N/A;  . CARDIOVERSION N/A 07/27/2012   Procedure: CARDIOVERSION;  Surgeon: Jettie Booze, MD;  Location: [facility];  Service: Cardiovascular;  Laterality: N/A;  . COLONOSCOPY WITH PROPOFOL N/A 04/27/2016   Procedure: COLONOSCOPY WITH PROPOFOL;  Surgeon: Wilford Corner, MD;  Location[facility]COPY;  Service: Endoscopy;  Laterality: N/A;  . INTRAOPERATIVE TRANSESOPHAGEAL ECHOCARDIOGRAM  02/15/2012   Procedure: INTRAOPERATIVE TRANSESOPHAGEAL ECHOCARDIOGRAM;  Surgeon: Rexene Alberts, MD;  Location: [facility];  Service: Open Heart Surgery;  Laterality: N/A;  . MAZE  02/15/2012   Procedure: MAZE;  Surgeon: Rexene Alberts, MD;  Location: [facility];  Service: Open Heart Surgery;  Laterality: N/A;  . MITRAL VALVE REPLACEMENT  02/15/2012   Procedure: MITRAL VALVE (  MV) REPLACEMENT;  Surgeon: Rexene Alberts, MD;  Location: [facility];  Service: Open Heart Surgery;  Laterality: N/A;  . NO PAST SURGERIES    . TEE WITHOUT CARDIOVERSION  12/21/2011   Procedure: TRANSESOPHAGEAL ECHOCARDIOGRAM (TEE);  Surgeon: Jettie Booze, MD;  Location: [facility] ENDOSCOPY;  Service: Cardiovascular;  Laterality: N/A;  . TRICUSPID VALVE REPLACEMENT  02/15/2012   Procedure: TRICUSPID VALVE REPAIR;  Surgeon: Rexene Alberts, MD;  Location: [place];  Service: Open Heart Surgery;  Laterality: N/A;     Current Outpatient Medications  Medication  Sig Dispense Refill  . amiodarone (PACERONE) 200 MG tablet TAKE 1 TABLET(200 MG) BY MOUTH DAILY 90 tablet 3  . amLODipine (NORVASC) 5 MG tablet Take 1 tablet (5 mg total) by mouth daily. Please keep upcoming appt in September for future refills. Thank you 90 tablet 0  . atorvastatin (LIPITOR) 40 MG tablet Take 1 tablet (40 mg total) by mouth daily. 90 tablet 3  . calcium carbonate (OS-CAL - DOSED IN MG OF ELEMENTAL CALCIUM) 1250 MG tablet Take 1 tablet by mouth daily.      . carvedilol (COREG) 12.5 MG tablet TAKE 1 TABLET BY MOUTH TWICE DAILY WITH A MEAL 180 tablet 3  . Cholecalciferol (VITAMIN D) 1000 UNITS capsule Take 1,000 Units by mouth daily.     . cyclobenzaprine (FLEXERIL) 10 MG tablet Take 1 tablet by mouth daily as needed. Muscle spasms    . furosemide (LASIX) 40 MG tablet TAKE 1 AND 1/2 TABLETS(60 MG) BY MOUTH TWICE DAILY 270 tablet 1  . insulin glargine (LANTUS SOLOSTAR) 100 UNIT/ML injection Inject 30-35 Units into the skin See admin instructions. Inject 30 units once daily and inject 35 units daily at bedtime.    . irbesartan (AVAPRO) 300 MG tablet TAKE 1 TABLET(300 MG) BY MOUTH DAILY 90 tablet 3  . metFORMIN (GLUCOPHAGE) 500 MG tablet Take 500 mg by mouth 2 (two) times daily with a meal.     . methocarbamol (ROBAXIN) 500 MG tablet Take 500 mg by mouth daily as needed for muscle spasms.     . Omega-3 Fatty Acids (FISH OIL) 1000 MG CAPS Take 1 capsule by mouth daily.     . potassium chloride SA (K-DUR,KLOR-CON) 20 MEQ tablet TAKE 1 TABLET(20 MEQ) BY MOUTH DAILY 90 tablet 3  . warfarin (COUMADIN) 4 MG tablet Take as directed by [facility] 135 tablet 1   No current facility-administered medications for this visit.     Allergies:   Patient has no known allergies.    Social History:  The patient  reports that she has never smoked. She has never used smokeless tobacco. She reports that she does not drink alcohol or use drugs.   Family History:  The patient's family history  includes Asthma in her mother; Diabetes in her mother; Hypertension in her brother.    ROS:  Please see the history of present illness.   Otherwise, review of systems are positive for leg swelling.   All other systems are reviewed and negative.    PHYSICAL EXAM: VS:  BP (!) 156/102   Pulse 67   Ht 5\' 6"  (1.676 m)   Wt 234 lb 6.4 oz (106.3 kg)   SpO2 90%   BMI 37.83 kg/m  , BMI Body mass index is 37.83 kg/m. GEN: Well nourished, well developed, in no acute distress  HEENT: normal  Neck: no JVD, carotid bruits, or masses Cardiac: RRR; 2/6 systolic murmur, no, rubs, or gallops,no edema ;  crisp S1 click Respiratory:  clear to auscultation bilaterally, normal work of breathing GI: soft, nontender, nondistended, + BS MS: no deformity or atrophy  Skin: warm and dry, no rash Neuro:  Strength and sensation are intact Psych: euthymic mood, full affect   EKG:   The ekg ordered today demonstrates junctional rhythm, LBBB   Recent Labs: No results found for requested labs within last 8760 hours.   Lipid Panel    Component Value Date/Time   CHOL 152 05/28/2015 0958   TRIG 108 05/28/2015 0958   HDL 35 (L) 05/28/2015 0958   CHOLHDL 4.3 05/28/2015 0958   VLDL 22 05/28/2015 0958   LDLCALC 95 05/28/2015 0958     Other studies Reviewed: Additional studies/ records that were reviewed today with results demonstrating: LDL 87 in 2019.   ASSESSMENT AND PLAN:  1. Mitral valve disorder:  COumadin for prosthetic valve.  2. Chronic systolic heart failure: She appears euvolemic.  3. AFib: in junctional rhythm.  Coumadin for stroke prevention.  4. HTN: She has not taken her meds yet today.  She has a machine so she can check at home.   5. DM: A1C 6.3 in 9/19. Healthy diet info given.  Increase exercise.    Current medicines are reviewed at length with the patient today.  The patient concerns regarding her medicines were addressed.  The following changes have been made:  No change   Labs/ tests ordered today include:  No orders of the defined types were placed in this encounter.   Recommend 150 minutes/week of aerobic exercise Low fat, low carb, high fiber diet recommended  Disposition:   FU in 1 year   Signed, Larae Grooms, MD  09/14/2018 11:47 AM    [facility] [address], [place], [place]  [address] Phone: [phone]; Fax: [phone]

## 2018-09-25 ENCOUNTER — Other Ambulatory Visit: Payer: Self-pay | Admitting: Physician Assistant

## 2018-09-25 DIAGNOSIS — Z1231 Encounter for screening mammogram for malignant neoplasm of breast: Secondary | ICD-10-CM

## 2018-10-02 DIAGNOSIS — E1169 Type 2 diabetes mellitus with other specified complication: Secondary | ICD-10-CM | POA: Diagnosis not present

## 2018-10-02 DIAGNOSIS — I4891 Unspecified atrial fibrillation: Secondary | ICD-10-CM | POA: Diagnosis not present

## 2018-10-02 DIAGNOSIS — Z952 Presence of prosthetic heart valve: Secondary | ICD-10-CM | POA: Diagnosis not present

## 2018-10-02 DIAGNOSIS — E7800 Pure hypercholesterolemia, unspecified: Secondary | ICD-10-CM | POA: Diagnosis not present

## 2018-10-02 DIAGNOSIS — I110 Hypertensive heart disease with heart failure: Secondary | ICD-10-CM | POA: Diagnosis not present

## 2018-10-02 DIAGNOSIS — N182 Chronic kidney disease, stage 2 (mild): Secondary | ICD-10-CM | POA: Diagnosis not present

## 2018-10-02 DIAGNOSIS — Z0000 Encounter for general adult medical examination without abnormal findings: Secondary | ICD-10-CM | POA: Diagnosis not present

## 2018-10-02 DIAGNOSIS — Z23 Encounter for immunization: Secondary | ICD-10-CM | POA: Diagnosis not present

## 2018-10-02 DIAGNOSIS — I509 Heart failure, unspecified: Secondary | ICD-10-CM | POA: Diagnosis not present

## 2018-10-11 ENCOUNTER — Other Ambulatory Visit: Payer: Self-pay | Admitting: Pharmacist

## 2018-10-11 ENCOUNTER — Other Ambulatory Visit: Payer: Self-pay | Admitting: Interventional Cardiology

## 2018-10-11 DIAGNOSIS — I4819 Other persistent atrial fibrillation: Secondary | ICD-10-CM

## 2018-10-11 MED ORDER — AMLODIPINE BESYLATE 5 MG PO TABS: 5.0000 mg | ORAL_TABLET | Freq: Every day | ORAL | 3 refills | Status: DC

## 2018-10-11 MED ORDER — CARVEDILOL 12.5 MG PO TABS: 12.5000 mg | ORAL_TABLET | Freq: Two times a day (BID) | ORAL | 3 refills | Status: DC

## 2018-10-11 MED ORDER — FUROSEMIDE 40 MG PO TABS: ORAL_TABLET | ORAL | 3 refills | Status: DC

## 2018-10-11 MED ORDER — WARFARIN SODIUM 4 MG PO TABS: ORAL_TABLET | ORAL | 1 refills | Status: DC

## 2018-10-19 ENCOUNTER — Other Ambulatory Visit: Payer: Self-pay

## 2018-10-19 ENCOUNTER — Ambulatory Visit (INDEPENDENT_AMBULATORY_CARE_PROVIDER_SITE_OTHER): Payer: Medicare HMO | Admitting: *Deleted

## 2018-10-19 DIAGNOSIS — I059 Rheumatic mitral valve disease, unspecified: Secondary | ICD-10-CM

## 2018-10-19 DIAGNOSIS — Z5181 Encounter for therapeutic drug level monitoring: Secondary | ICD-10-CM | POA: Diagnosis not present

## 2018-10-19 LAB — POCT INR: INR: 5.7 — AB (ref 2.0–3.0)

## 2018-10-19 NOTE — Patient Instructions (Addendum)
Description   Hold Coumadin today and tomorrow and Sunday, then continue taking 1.5 tablets daily except 1 tablet on Sundays. Recheck INR in 2 weeks. Call us with any medication changes or concerns to [facility] @ [phone].

## 2018-10-19 NOTE — Progress Notes (Signed)
Pt coming back in 2 weeks for scheduling purposes. Pt needs early appointment to help with grandchild's school.

## 2018-11-02 ENCOUNTER — Other Ambulatory Visit: Payer: Self-pay

## 2018-11-02 ENCOUNTER — Ambulatory Visit (INDEPENDENT_AMBULATORY_CARE_PROVIDER_SITE_OTHER): Payer: Medicare HMO | Admitting: *Deleted

## 2018-11-02 DIAGNOSIS — Z5181 Encounter for therapeutic drug level monitoring: Secondary | ICD-10-CM | POA: Diagnosis not present

## 2018-11-02 DIAGNOSIS — I059 Rheumatic mitral valve disease, unspecified: Secondary | ICD-10-CM

## 2018-11-02 LAB — POCT INR: INR: 5 — AB (ref 2.0–3.0)

## 2018-11-02 NOTE — Patient Instructions (Signed)
Description   Hold Coumadin today and tomorrow, then start taking 1.5 tablets daily except 1 tablet on Sundays and Thursdays. Recheck INR in 2 weeks. Call us with any medication changes or concerns to Cou[facility] Clinic @ [phone].

## 2018-11-12 ENCOUNTER — Other Ambulatory Visit: Payer: Self-pay

## 2018-11-12 ENCOUNTER — Ambulatory Visit
Admission: RE | Admit: 2018-11-12 | Discharge: 2018-11-12 | Disposition: A | Discharge status: Home / Self Care | Payer: Medicare HMO | Source: Ambulatory Visit | Attending: Physician Assistant | Admitting: Physician Assistant

## 2018-11-12 DIAGNOSIS — Z1231 Encounter for screening mammogram for malignant neoplasm of breast: Secondary | ICD-10-CM

## 2018-11-16 ENCOUNTER — Other Ambulatory Visit: Payer: Self-pay

## 2018-11-16 ENCOUNTER — Ambulatory Visit (INDEPENDENT_AMBULATORY_CARE_PROVIDER_SITE_OTHER): Payer: Medicare HMO | Admitting: *Deleted

## 2018-11-16 DIAGNOSIS — Z5181 Encounter for therapeutic drug level monitoring: Secondary | ICD-10-CM

## 2018-11-16 DIAGNOSIS — I059 Rheumatic mitral valve disease, unspecified: Secondary | ICD-10-CM | POA: Diagnosis not present

## 2018-11-16 LAB — POCT INR: INR: 5.5 — AB (ref 2.0–3.0)

## 2018-11-16 NOTE — Patient Instructions (Signed)
Description   Hold Coumadin today and tomorrow, then start taking 1 tablet daily except 1.5 tablets on Mondays,  Wednesdays, and Fridays. Recheck INR in 2 weeks. Call us with any medication changes or concerns to Coumadin Clinic @ [phone].

## 2018-11-20 ENCOUNTER — Other Ambulatory Visit: Payer: Self-pay | Admitting: Nurse Practitioner

## 2018-11-20 ENCOUNTER — Other Ambulatory Visit (HOSPITAL_COMMUNITY)
Admission: RE | Admit: 2018-11-20 | Discharge: 2018-11-20 | Disposition: A | Discharge status: Home / Self Care | Payer: Medicare HMO | Source: Ambulatory Visit | Attending: Nurse Practitioner | Admitting: Nurse Practitioner

## 2018-11-20 DIAGNOSIS — Z1151 Encounter for screening for human papillomavirus (HPV): Secondary | ICD-10-CM | POA: Insufficient documentation

## 2018-11-20 DIAGNOSIS — N950 Postmenopausal bleeding: Secondary | ICD-10-CM | POA: Diagnosis not present

## 2018-11-20 DIAGNOSIS — C541 Malignant neoplasm of endometrium: Secondary | ICD-10-CM | POA: Diagnosis not present

## 2018-11-28 ENCOUNTER — Other Ambulatory Visit: Payer: Self-pay

## 2018-11-28 ENCOUNTER — Telehealth: Payer: Self-pay | Admitting: *Deleted

## 2018-11-28 ENCOUNTER — Ambulatory Visit (INDEPENDENT_AMBULATORY_CARE_PROVIDER_SITE_OTHER): Payer: Medicare HMO | Admitting: Pharmacist

## 2018-11-28 DIAGNOSIS — I059 Rheumatic mitral valve disease, unspecified: Secondary | ICD-10-CM

## 2018-11-28 DIAGNOSIS — C541 Malignant neoplasm of endometrium: Secondary | ICD-10-CM

## 2018-11-28 DIAGNOSIS — Z5181 Encounter for therapeutic drug level monitoring: Secondary | ICD-10-CM

## 2018-11-28 DIAGNOSIS — N950 Postmenopausal bleeding: Secondary | ICD-10-CM | POA: Diagnosis not present

## 2018-11-28 LAB — POCT INR: INR: 2.3 (ref 2.0–3.0)
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
## 2018-11-28 NOTE — Patient Instructions (Signed)
Description   Take 2 tablets today, then continue taking 1 tablet daily except 1.5 tablets on Mondays,  Wednesdays, and Fridays. Recheck INR in 2 weeks. Call us with any medication changes or concerns to Coumadin Clinic @ [phone].
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
## 2018-11-28 NOTE — Telephone Encounter (Signed)
Called the patient and scheduled the patient for an appt on Friday

## 2018-11-28 NOTE — Telephone Encounter (Signed)
Attempted to call the patient to schedule an appt, no answer and voicemail is full

## 2018-11-30 ENCOUNTER — Encounter: Payer: Self-pay | Admitting: Gynecologic Oncology

## 2018-11-30 ENCOUNTER — Other Ambulatory Visit: Payer: Self-pay

## 2018-11-30 ENCOUNTER — Telehealth: Payer: Self-pay | Admitting: *Deleted

## 2018-11-30 ENCOUNTER — Inpatient Hospital Stay: Payer: Medicare HMO

## 2018-11-30 ENCOUNTER — Other Ambulatory Visit: Payer: Self-pay | Admitting: Gynecologic Oncology

## 2018-11-30 ENCOUNTER — Inpatient Hospital Stay: Payer: Medicare HMO | Attending: Gynecologic Oncology | Admitting: Gynecologic Oncology

## 2018-11-30 ENCOUNTER — Telehealth: Payer: Self-pay

## 2018-11-30 VITALS — BP 176/81 | HR 80 | Temp 98.7°F | Resp 18 | Ht 66.0 in | Wt 230.0 lb

## 2018-11-30 DIAGNOSIS — C541 Malignant neoplasm of endometrium: Secondary | ICD-10-CM

## 2018-11-30 DIAGNOSIS — E669 Obesity, unspecified: Secondary | ICD-10-CM

## 2018-11-30 DIAGNOSIS — Z5181 Encounter for therapeutic drug level monitoring: Secondary | ICD-10-CM

## 2018-11-30 DIAGNOSIS — E1169 Type 2 diabetes mellitus with other specified complication: Secondary | ICD-10-CM | POA: Diagnosis not present

## 2018-11-30 LAB — COMPREHENSIVE METABOLIC PANEL
ALT: 21 U/L (ref 0–44)
AST: 28 U/L (ref 15–41)
Albumin: 3.9 g/dL (ref 3.5–5.0)
Alkaline Phosphatase: 102 U/L (ref 38–126)
Anion gap: 9 (ref 5–15)
BUN: 14 mg/dL (ref 8–23)
CO2: 23 mmol/L (ref 22–32)
Calcium: 9.2 mg/dL (ref 8.9–10.3)
Chloride: 107 mmol/L (ref 98–111)
Creatinine, Ser: 0.92 mg/dL (ref 0.44–1.00)
GFR calc Af Amer: >60 mL/min (ref >60)
GFR calc non Af Amer: >60 mL/min (ref >60)
Glucose, Bld: 106 mg/dL — ABNORMAL HIGH (ref 70–99)
Potassium: 4 mmol/L (ref 3.5–5.1)
Sodium: 139 mmol/L (ref 135–145)
Total Bilirubin: 0.6 mg/dL (ref 0.3–1.2)
Total Protein: 8.1 g/dL (ref 6.5–8.1)

## 2018-11-30 LAB — HEMOGLOBIN A1C
Hgb A1c MFr Bld: 6.1 % — ABNORMAL HIGH (ref 4.8–5.6)
Mean Plasma Glucose: 128.37 mg/dL

## 2018-11-30 NOTE — Telephone Encounter (Signed)
I s/w pt and she is agreeable to appt with Dr. Irish Lack @ 3:40 pm on 12/14/18 (ok per [place] RN to use time slot for surgery clearance). I will route clearance notes to Dr. Irish Lack for appt. I will remove from the pre op call back pool.

## 2018-11-30 NOTE — Telephone Encounter (Signed)
Told Ms Pla that the labs came back good. Hgb A1c was 6.1. and the chemistries were good. BS = 106 per Joylene John, NP.

## 2018-11-30 NOTE — Telephone Encounter (Signed)
Clinical pharmacist to review coumadin bridge given PAF and mechanical MVR.   MVR and TVR surgery 02/15/2012 by Dr. [name] Procedure:       Mitral Valve Replacement             Sorin Carbomedics Optiform mechanical prosthesis (size 93mm, catalog #[id number], serial [id number])  Tricuspid Valve Repair             Edwards mc3 ring annuloplasty (size 72mm, model #4900, serial [id number])  Maze Procedure              complete biatrial lesion set using bipolar radiofrequency and cryothermy ablation with clipping of left atrial appendage

## 2018-11-30 NOTE — Telephone Encounter (Signed)
   [facility] Pre-operative Risk Assessment    Request for surgical clearance:  1. What type of surgery is being performed? TOTAL HYSTERECTOMY, BSO, SVL (ROBITIC ASSISTED LAPAROSCOPIC)    2. When is this surgery scheduled? 12/27/18   3. What type of clearance is required (medical clearance vs. Pharmacy clearance to hold med vs. Both)? BOTH  4. Are there any medications that need to be held prior to surgery and how long? WARFARIN NEEDS LOVENOX BRIDGING   5. Practice name and name of physician performing surgery? [facility]; DR. Belenda Cruise TUCKER   6. What is your office phone number [phone]    7.   What is your office fax number [phone]  8.   Anesthesia type (None, local, MAC, general) ? GENERAL    [name] 11/30/2018, 12:33 PM  _________________________________________________________________   (provider comments below)

## 2018-11-30 NOTE — Telephone Encounter (Signed)
   [place], MD  Chart reviewed as part of pre-operative protocol coverage. Because of CHINNA HEIT past medical history and time since last visit, he/she will require a follow-up visit in order to better assess preoperative cardiovascular risk.  Pre-op covering staff: - Please schedule appointment and call patient to inform them. - Please contact requesting surgeon's office via preferred method (i.e, phone, fax) to inform them of need for appointment prior to surgery.  If applicable, this message will also be routed to pharmacy pool and/or primary cardiologist for input on holding anticoagulant/antiplatelet agent as requested below so that this information is available at time of patient's appointment.   Note, although I initially tried to clear the patient over the phone since she is doing well. She says she only wish to be cleared by Dr. Irish Lack himself and wish to have a appt prior to her surgery. Call back pool to arrange MD only visit with Dr. Irish Lack prior to her surgery. Coumadin clinic to arrange lab and lovenox bridging.    [place], [name]  11/30/2018, 5:09 PM

## 2018-11-30 NOTE — Patient Instructions (Addendum)
We will check a metabolic panel today to assess your kidneys prior to your CT scan and a hemoglobin A1C to see how your blood sugars have been running for the past 3 months.  We will also reach out to your cardiologist to obtain cardiac clearance prior to surgery.  You may need to be seen by your cardiologist and/or have further testing. WE WILL ALSO OBTAIN INSTRUCTIONS FROM YOUR CARDIOLOGIST ABOUT WARFARIN (COUMADIN) PRIOR TO SURGERY.  Preparing for your Surgery  Plan for surgery on December 27, 2018 with Dr. Jeral Pinch at [place] will be scheduled for a robotic assisted total laparoscopic hysterectomy, bilateral salpingo-oophorectomy, sentinel lymph node biopsy.  YOU WILL HAVE A PHONE VISIT WITH DR Berline Lopes PRIOR TO SURGERY TO DISCUSS CT SCAN.   Pre-operative Testing -You will receive a phone call from presurgical testing at [facility] if you have not received a call already to arrange for a pre-operative testing appointment before your surgery.  This appointment normally occurs one to two weeks before your scheduled surgery.   -Bring your insurance card, copy of an advanced directive if applicable, medication list  -At that visit, you will be asked to sign a consent for a possible blood transfusion in case a transfusion becomes necessary during surgery.  The need for a blood transfusion is rare but having consent is a necessary part of your care.     -YOU WILL BE ADVISED ABOUT WHEN TO STOP YOUR COUMADIN PRIOR TO SURGERY.  -As part of our enhanced surgical recovery pathway, you may be advised to drink a carbohydrate drink the morning of surgery (at least 3 hours before). If you are diabetic, this will be substituted with G2 gatorade in order to prevent elevated glucose levels prior to surgery.  -Do not take supplements such as fish oil (omega 3), red yeast rice, tumeric before your surgery.  Day Before Surgery at [place] will be asked to take in a light  diet the day before surgery.  Avoid carbonated beverages.  You will be advised to have nothing to eat or drink after midnight the evening before.    Eat a light diet the day before surgery.  Examples including soups, broths, toast, yogurt, mashed potatoes.  Things to avoid include carbonated beverages (fizzy beverages), raw fruits and raw vegetables, or beans.   If your bowels are filled with gas, your surgeon will have difficulty visualizing your pelvic organs which increases your surgical risks.  Your role in recovery Your role is to become active as soon as directed by your doctor, while still giving yourself time to heal.  Rest when you feel tired. You will be asked to do the following in order to speed your recovery:  - Cough and breathe deeply. This helps toclear and expand your lungs and can prevent pneumonia.  - Do mild physical activity. Walking or moving your legs help your circulation and body functions return to normal. A staff member will help you when you try to walk and will provide you with simple exercises. Do not try to get up or walk alone the first time. - Actively manage your pain. Managing your pain lets you move in comfort. We will ask you to rate your pain on a scale of zero to 10. It is your responsibility to tell your doctor or nurse where and how much you hurt so your pain can be treated.  Special Considerations -If you are diabetic, you may be placed on insulin after  surgery to have closer control over your blood sugars to promote healing and recovery.  This does not mean that you will be discharged on insulin.  If applicable, your oral antidiabetics will be resumed when you are tolerating a solid diet.  -Your final pathology results from surgery should be available around one week after surgery and the results will be relayed to you when available.  -Dr. Lahoma Crocker is the surgeon that assists your GYN Oncologist with surgery.  If you end up staying the night,  the next day after your surgery you will either see Dr. Denman George or Dr. Lahoma Crocker.  -FMLA forms can be faxed to [phone] and please allow 5-7 business days for completion.   Blood Transfusion Information WHAT IS A BLOOD TRANSFUSION? A transfusion is the replacement of blood or some of its parts. Blood is made up of multiple cells which provide different functions.  Red blood cells carry oxygen and are used for blood loss replacement.  White blood cells fight against infection.  Platelets control bleeding.  Plasma helps clot blood.  Other blood products are available for specialized needs, such as hemophilia or other clotting disorders. BEFORE THE TRANSFUSION  Who gives blood for transfusions?   You may be able to donate blood to be used at a later date on yourself (autologous donation).  Relatives can be asked to donate blood. This is generally not any safer than if you have received blood from a stranger. The same precautions are taken to ensure safety when a relative's blood is donated.  Healthy volunteers who are fully evaluated to make sure their blood is safe. This is blood bank blood. Transfusion therapy is the safest it has ever been in the practice of medicine. Before blood is taken from a donor, a complete history is taken to make sure that person has no history of diseases nor engages in risky social behavior (examples are intravenous drug use or sexual activity with multiple partners). The donor's travel history is screened to minimize risk of transmitting infections, such as malaria. The donated blood is tested for signs of infectious diseases, such as HIV and hepatitis. The blood is then tested to be sure it is compatible with you in order to minimize the chance of a transfusion reaction. If you or a relative donates blood, this is often done in anticipation of surgery and is not appropriate for emergency situations. It takes many days to process the donated blood.  RISKS AND COMPLICATIONS Although transfusion therapy is very safe and saves many lives, the main dangers of transfusion include:   Getting an infectious disease.  Developing a transfusion reaction. This is an allergic reaction to something in the blood you were given. Every precaution is taken to prevent this. The decision to have a blood transfusion has been considered carefully by your caregiver before blood is given. Blood is not given unless the benefits outweigh the risks.  AFTER SURGERY INSTRUCTIONS  11/30/2018  Return to work: 4-6 weeks if applicable  Activity: 1. Be up and out of the bed during the day.  Take a nap if needed.  You may walk up steps but be careful and use the hand rail.  Stair climbing will tire you more than you think, you may need to stop part way and rest.   2. No lifting or straining for 6 weeks.  3. No driving for 1 week(s) IF YOU WERE CLEARED TO DRIVE BEFORE SURGERY.  Do not drive if you are taking  narcotic pain medicine.  4. Shower daily.  Use soap and water on your incision and pat dry; don't rub.  No tub baths until cleared by your surgeon.   5. No sexual activity and nothing in the vagina for 8 weeks.  6. You may experience a small amount of clear drainage from your incisions, which is normal.  If the drainage persists or increases, please call the office.  7. You may experience vaginal spotting after surgery or around the 6-8 week mark from surgery when the stitches at the top of the vagina begin to dissolve.  The spotting is normal but if you experience heavy bleeding, call our office.  8. Take Tylenol or ibuprofen first for pain and only use narcotic pain medication for severe pain not relieved by the Tylenol or Ibuprofen.  Monitor your Tylenol intake to a max of 4,000 mg.  Diet: 1. Low sodium Heart Healthy Diet is recommended.  2. It is safe to use a laxative, such as Miralax or Colace, if you have difficulty moving your bowels. You can take  Sennakot at bedtime every evening to keep bowel movements regular and to prevent constipation.    Wound Care: 1. Keep clean and dry.  Shower daily.  Reasons to call the Doctor:  Fever - Oral temperature greater than 100.4 degrees Fahrenheit  Foul-smelling vaginal discharge  Difficulty urinating  Nausea and vomiting  Increased pain at the site of the incision that is unrelieved with pain medicine.  Difficulty breathing with or without chest pain  New calf pain especially if only on one side  Sudden, continuing increased vaginal bleeding with or without clots.   Contacts: For questions or concerns you should contact:  Dr. Jeral Pinch at [phone]  Joylene John, NP at [phone]  After Hours: call [phone] and have the GYN Oncologist paged/contacted

## 2018-11-30 NOTE — Telephone Encounter (Signed)
Patient with diagnosis of Afib on warfarin for anticoagulation.    Procedure: TOTAL HYSTERECTOMY, BSO, SVL (ROBITIC ASSISTED LAPAROSCOPIC)   Date of procedure: 12/27/2018  CHADS2-VASc score of  6 (CHF, HTN, DM2, stroke/tia x 2, female)  CrCl 91.9 mL/min Platelet count 144 (2017)  Per office protocol, patient can hold warfarin for 5 days prior to procedure.   Patient will need bridging with Lovenox (enoxaparin) around procedure. Patient will need CBC drawn for updated platelet levels.

## 2018-12-05 ENCOUNTER — Other Ambulatory Visit: Payer: Self-pay

## 2018-12-05 ENCOUNTER — Ambulatory Visit (HOSPITAL_COMMUNITY)
Admission: RE | Admit: 2018-12-05 | Discharge: 2018-12-05 | Disposition: A | Discharge status: Home / Self Care | Payer: Medicare HMO | Source: Ambulatory Visit | Attending: Gynecologic Oncology | Admitting: Gynecologic Oncology

## 2018-12-05 ENCOUNTER — Encounter (HOSPITAL_COMMUNITY): Payer: Self-pay

## 2018-12-05 DIAGNOSIS — C541 Malignant neoplasm of endometrium: Secondary | ICD-10-CM | POA: Insufficient documentation

## 2018-12-05 MED ORDER — IOHEXOL 300 MG/ML  SOLN
100.0000 mL | Freq: Once | INTRAMUSCULAR | Status: AC | PRN
  Administered 2018-12-05: 100 mL via INTRAVENOUS

## 2018-12-05 MED ORDER — SODIUM CHLORIDE (PF) 0.9 % IJ SOLN
INTRAMUSCULAR | Status: AC
  Filled 2018-12-05: qty 50

## 2018-12-12 ENCOUNTER — Other Ambulatory Visit: Payer: Self-pay | Admitting: Interventional Cardiology

## 2018-12-12 MED ORDER — AMLODIPINE BESYLATE 5 MG PO TABS: 5.0000 mg | ORAL_TABLET | Freq: Every day | ORAL | 2 refills | Status: DC

## 2018-12-14 ENCOUNTER — Other Ambulatory Visit: Payer: Self-pay

## 2018-12-14 ENCOUNTER — Encounter: Payer: Self-pay | Admitting: Interventional Cardiology

## 2018-12-14 ENCOUNTER — Ambulatory Visit (INDEPENDENT_AMBULATORY_CARE_PROVIDER_SITE_OTHER): Payer: Medicare HMO | Admitting: Interventional Cardiology

## 2018-12-14 ENCOUNTER — Ambulatory Visit (INDEPENDENT_AMBULATORY_CARE_PROVIDER_SITE_OTHER): Payer: Medicare HMO | Admitting: *Deleted

## 2018-12-14 VITALS — BP 142/70 | HR 83 | Ht 66.0 in | Wt 227.4 lb

## 2018-12-14 DIAGNOSIS — I059 Rheumatic mitral valve disease, unspecified: Secondary | ICD-10-CM

## 2018-12-14 DIAGNOSIS — I519 Heart disease, unspecified: Secondary | ICD-10-CM

## 2018-12-14 DIAGNOSIS — Z952 Presence of prosthetic heart valve: Secondary | ICD-10-CM

## 2018-12-14 DIAGNOSIS — Z01810 Encounter for preprocedural cardiovascular examination: Secondary | ICD-10-CM

## 2018-12-14 DIAGNOSIS — Z5181 Encounter for therapeutic drug level monitoring: Secondary | ICD-10-CM | POA: Diagnosis not present

## 2018-12-14 DIAGNOSIS — Z7901 Long term (current) use of anticoagulants: Secondary | ICD-10-CM | POA: Diagnosis not present

## 2018-12-14 DIAGNOSIS — I480 Paroxysmal atrial fibrillation: Secondary | ICD-10-CM | POA: Diagnosis not present

## 2018-12-14 LAB — CBC
Hematocrit: 32.9 % — ABNORMAL LOW (ref 34.0–46.6)
Hemoglobin: 11 g/dL — ABNORMAL LOW (ref 11.1–15.9)
MCH: 30.8 pg (ref 26.6–33.0)
MCHC: 33.4 g/dL (ref 31.5–35.7)
MCV: 92 fL (ref 79–97)
Platelets: 180 10*3/uL (ref 150–450)
RBC: 3.57 x10E6/uL — ABNORMAL LOW (ref 3.77–5.28)
RDW: 12.2 % (ref 11.7–15.4)
WBC: 5.6 10*3/uL (ref 3.4–10.8)

## 2018-12-14 LAB — POCT INR: INR: 2.5 (ref 2.0–3.0)

## 2018-12-14 MED ORDER — ENOXAPARIN SODIUM 100 MG/ML ~~LOC~~ SOLN: 100.0000 mg | Freq: Two times a day (BID) | SUBCUTANEOUS | 0 refills | Status: DC

## 2018-12-14 NOTE — Patient Instructions (Addendum)
   12/11: Last dose of Coumadin.  12/12: No Coumadin or Lovenox.  12/13: Inject Lovenox 100 mg in the fatty abdominal tissue at least 2 inches from the belly button twice a day about 12 hours apart, 8am and 8pm rotate sites. No Coumadin.  12/14: Inject Lovenox in the fatty tissue every 12 hours, 8am and 8pm. No Coumadin.  12/15: Inject Lovenox in the fatty tissue every 12 hours, 8am and 8pm. No Coumadin.  12/16: Inject Lovenox in the fatty tissue in the morning at 8 am (No PM dose). No Coumadin.  12/17: Procedure Day - No Lovenox - Resume Coumadin in the evening or as directed by doctor.   12/18: Resume Lovenox inject in the fatty tissue every 12 hours and take Coumadin.  12/19: Inject Lovenox in the fatty tissue every 12 hours and take Coumadin.  12/20: Inject Lovenox in the fatty tissue every 12 hours and take Coumadin.  12/21: Inject Lovenox in the fatty tissue every 12 hours and take Coumadin.  12/22: Inject Lovenox in the fatty tissue every 12 hours and take Coumadin.  12/23: Inject Lovenox in the fatty tissue at 8 am and report to Coumadin appt to check INR.  Description   Follow bridge instructions. Continue taking 1 tablet daily except 1.5 tablets on Mondays,  Wednesdays, and Fridays. Recheck INR 1 week post procedure. Call us with any medication changes or concerns to Coumadin Clinic @ [phone].

## 2018-12-14 NOTE — Progress Notes (Signed)
Cardiology Office Note   Date:  12/14/2018   ID:  Natalie, [name] 14-Mar-1955, MRN [id number]  PCP:  Lennie Odor, PA-C    No chief complaint on file.  S/p MVR  Wt Readings from Last 3 Encounters:  12/14/18 227 lb 6.4 oz (103.1 kg)  11/30/18 230 lb (104.3 kg)  09/14/18 234 lb 6.4 oz (106.3 kg)       History of Present Illness: [name] is a 63 y.o. female  who had a mitral valve replacement and TV repairin 2014. This was complicated by a post op CVA. She has had atrial flutter. Dr. Rayann Heman recommended amio followed by cardioversion. She had the cardioversion in 07/2012.   2013 cath showed: The left main coronary artery is angiographically normal.  The left anterior descending artery is angiographically normal.  There is a medium sized diagonal which is widely patent.  The left circumflex artery is angiographically normal.  There is a large ramus vessel which is widely patent.  Large OM1 is angiographically normal.  The right coronary artery is a large dominant vessel which is angiographically normal.  She is on disability at this point. She has some trouble going up stairs, and walking some distance even on flat ground.   Minimal walking at this point.  Tries to use waling for exercise.  No other exercise equipment.    Coumadin hadbeendifficult toregulate.Easier to regulate now, typically 3-5 weeks check.  SInce the last visit, she was diagnosed with uterine cancer and will need surgery.  Her main activity is grocery shopping.  She drives and shops independently.  She can load and unload her cart.   There is a smaller store down the street from where she lives that she walks to regularly.   Denies : Chest pain. Dizziness. Leg edema. Nitroglycerin use. Orthopnea. Palpitations. Paroxysmal nocturnal dyspnea. Shortness of breath. Syncope.       Past Medical History:  Diagnosis Date  . Abnormal chest CT   . Asthma   . Atrial enlargement, left     severe  . Atrial fibrillation ([place]) 01/05/2011   Chronic persistent, failed DCCV   . Atrial flutter ([place])   . Bronchospasm 1998  . Carotid bruit   . Chronic diastolic congestive heart failure ([place])   . Diabetes mellitus    insulin dependent  . Heart murmur   . History of blood transfusion   . Hypercholesterolemia   . Hypertension   . Mitral regurgitation   . Obesity   . Obesity (BMI 30-39.9) 01/16/2012  . Persistent atrial fibrillation ([place])   . Polyp of rectum   . Rheumatic fever 01/16/2012   Reported during childhood  . S/P Maze operation for atrial fibrillation 02/15/2012   Complete biatrial lesion set using bipolar radiofrequency and cryothermy ablation with clipping of LA appendage  . S/P mitral valve replacement with metallic valve [id number]   38mm Sorin Carbomedics Optiform mechanical prosthesis  . 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 Shortness of breath    with exertion  . Sleep apnea    DOES NOT HAVE CPAP  . Tricuspid regurgitation 01/16/2012    Past Surgical History:  Procedure Laterality Date  . CARDIAC CATHETERIZATION  >5 years  . CARDIOVASCULAR STRESS TEST  09/2010  . CARDIOVERSION  03/25/2011   Procedure: CARDIOVERSION;  Surgeon: Jettie Booze, MD;  Location: [facility];  Service: Cardiovascular;  Laterality: N/A;  . CARDIOVERSION N/A 07/27/2012  Procedure: CARDIOVERSION;  Surgeon: Jettie Booze, MD;  Location: [place];  Service: Cardiovascular;  Laterality: N/A;  . CESAREAN SECTION    . COLONOSCOPY WITH PROPOFOL N/A 04/27/2016   Procedure: COLONOSCOPY WITH PROPOFOL;  Surgeon: Wilford Corner, MD;  Location: [facility] ENDOSCOPY;  Service: Endoscopy;  Laterality: N/A;  . INTRAOPERATIVE TRANSESOPHAGEAL ECHOCARDIOGRAM  02/15/2012   Procedure: INTRAOPERATIVE TRANSESOPHAGEAL ECHOCARDIOGRAM;  Surgeon: Rexene Alberts, MD;  Location: [place];  Service: Open Heart Surgery;  Laterality: N/A;  . MAZE  02/15/2012   Procedure: MAZE;   Surgeon: Rexene Alberts, MD;  Location: [place];  Service: Open Heart Surgery;  Laterality: N/A;  . MITRAL VALVE REPLACEMENT  02/15/2012   Procedure: MITRAL VALVE (MV) REPLACEMENT;  Surgeon: Rexene Alberts, MD;  Location: [place];  Service: Open Heart Surgery;  Laterality: N/A;  . TEE WITHOUT CARDIOVERSION  12/21/2011   Procedure: TRANSESOPHAGEAL ECHOCARDIOGRAM (TEE);  Surgeon: Jettie Booze, MD;  Location[facility]COPY;  Service: Cardiovascular;  Laterality: N/A;  . TRICUSPID VALVE REPLACEMENT  02/15/2012   Procedure: TRICUSPID VALVE REPAIR;  Surgeon: Rexene Alberts, MD;  Location: [place];  Service: Open Heart Surgery;  Laterality: N/A;  . TUBAL LIGATION     at time of her c-section     Current Outpatient Medications  Medication Sig Dispense Refill  . amiodarone (PACERONE) 200 MG tablet TAKE 1 TABLET(200 MG) BY MOUTH DAILY 90 tablet 3  . amLODipine (NORVASC) 5 MG tablet Take 1 tablet (5 mg total) by mouth daily. 90 tablet 2  . atorvastatin (LIPITOR) 40 MG tablet Take 1 tablet (40 mg total) by mouth daily. 90 tablet 3  . calcium carbonate (OS-CAL - DOSED IN MG OF ELEMENTAL CALCIUM) 1250 MG tablet Take 1 tablet by mouth daily.      . carvedilol (COREG) 12.5 MG tablet Take 1 tablet (12.5 mg total) by mouth 2 (two) times daily with a meal. 180 tablet 3  . Cholecalciferol (VITAMIN D) 1000 UNITS capsule Take 1,000 Units by mouth daily.     . cyclobenzaprine (FLEXERIL) 10 MG tablet Take 1 tablet by mouth daily as needed. Muscle spasms    . enoxaparin (LOVENOX) 100 MG/ML injection Inject 1 mL (100 mg total) into the skin every 12 (twelve) hours. 20 mL 0  . furosemide (LASIX) 40 MG tablet TAKE 1 AND 1/2 TABLETS(60 MG) BY MOUTH TWICE DAILY 270 tablet 3  . insulin glargine (LANTUS SOLOSTAR) 100 UNIT/ML injection Inject 30-35 Units into the skin See admin instructions. Inject 30 units once daily and inject 35 units daily at bedtime.    . irbesartan (AVAPRO) 300 MG tablet TAKE 1 TABLET(300 MG) BY MOUTH  DAILY 90 tablet 3  . megestrol (MEGACE) 20 MG tablet     . metFORMIN (GLUCOPHAGE) 500 MG tablet Take 500 mg by mouth 2 (two) times daily with a meal.     . methocarbamol (ROBAXIN) 500 MG tablet Take 500 mg by mouth daily as needed for muscle spasms.     . potassium chloride SA (K-DUR,KLOR-CON) 20 MEQ tablet TAKE 1 TABLET(20 MEQ) BY MOUTH DAILY 90 tablet 3  . warfarin (COUMADIN) 4 MG tablet Take 1 to 1.5 tablets daily as directed by Coumadin Clinic 135 tablet 1   No current facility-administered medications for this visit.     Allergies:   Patient has no known allergies.    Social History:  The patient  reports that she has never smoked. She has never used smokeless tobacco. She reports that  she does not drink alcohol or use drugs.   Family History:  The patient's family history includes Asthma in her mother; Diabetes in her mother; Hypertension in her brother and mother.    ROS:  Please see the history of present illness.   Otherwise, review of systems are positive for .   All other systems are reviewed and negative.    PHYSICAL EXAM: VS:  BP (!) 142/70   Pulse 83   Ht 5\' 6"  (1.676 m)   Wt 227 lb 6.4 oz (103.1 kg)   SpO2 91%   BMI 36.70 kg/m  , BMI Body mass index is 36.7 kg/m. GEN: Well nourished, well developed, in no acute distress  HEENT: normal  Neck: no JVD, carotid bruits, or masses Cardiac: irregular HR; 2/6 systolic murmur, Crisp S1 click, no rubs, or gallops,no edema  Respiratory:  clear to auscultation bilaterally, normal work of breathing GI: soft, nontender, nondistended, + BS MS: no deformity or atrophy  Skin: warm and dry, no rash Neuro:  Strength and sensation are intact Psych: euthymic mood, full affect   EKG:   The ekg ordered 09/2018 demonstrates junctional rhythm LBBB   Recent Labs: 11/30/2018: ALT 21; BUN 14; Creatinine, Ser 0.92; Potassium 4.0; Sodium 139   Lipid Panel    Component Value Date/Time   CHOL 152 05/28/2015 0958   TRIG 108  05/28/2015 0958   HDL 35 (L) 05/28/2015 0958   CHOLHDL 4.3 05/28/2015 0958   VLDL 22 05/28/2015 0958   LDLCALC 95 05/28/2015 0958     Other studies Reviewed: Additional studies/ records that were reviewed today with results demonstrating: .   ASSESSMENT AND PLAN:  1. S/p MVR: COumadin to prevent valve thrombosis. Plans in place for Lovenox bridge at the time of surgery. 2. PAF: Coumadin or stroke prevention.  3. Chronic systolic heart failure: Appears well compensated/euvolemic. 4. Preoperative eval: No further cardiac testing needed at this time.  She walks a fair amount without cardiac symptoms.  5. Anticoagulated: Followed by PharmD.    Current medicines are reviewed at length with the patient today.  The patient concerns regarding her medicines were addressed.  The following changes have been made:  No change  Labs/ tests ordered today include:  No orders of the defined types were placed in this encounter.   Recommend 150 minutes/week of aerobic exercise Low fat, low carb, high fiber diet recommended  Disposition:   FU in 1 year   Signed, Larae Grooms, MD  12/14/2018 3:57 PM    [facility] [place], [place], [place]  [address] Phone: [phone]; Fax: [phone]

## 2018-12-14 NOTE — Patient Instructions (Signed)
Medication Instructions:  Your physician recommends that you continue on your current medications as directed. Please refer to the Current Medication list given to you today.  *If you need a refill on your cardiac medications before your next appointment, please call your pharmacy*  Lab Work: None ordered  If you have labs (blood work) drawn today and your tests are completely normal, you will receive your results only by: [name] MyChart Message (if you have MyChart) OR . A paper copy in the mail If you have any lab test that is abnormal or we need to change your treatment, we will call you to review the results.  Testing/Procedures: None ordered  Follow-Up: At [facility], you and your health needs are our priority.  As part of our continuing mission to provide you with exceptional heart care, we have created designated Provider Care Teams.  These Care Teams include your primary Cardiologist (physician) and Advanced Practice Providers (APPs -  Physician Assistants and Nurse Practitioners) who all work together to provide you with the care you need, when you need it.  Your next appointment:   9 month(s)  The format for your next appointment:   In Person  Provider:   You may see Larae Grooms, MD or one of the following Advanced Practice Providers on your designated Care Team:    Melina Copa, PA-C  Ermalinda Barrios, PA-C   Other Instructions You are cleared for surgery

## 2018-12-17 ENCOUNTER — Telehealth: Payer: Self-pay | Admitting: Gynecologic Oncology

## 2018-12-17 ENCOUNTER — Telehealth: Payer: Self-pay | Admitting: Interventional Cardiology

## 2018-12-17 NOTE — Telephone Encounter (Signed)
New Message  [facility] called to see if we had received pre-authorization for a medication. [facility] advised that they will send another fax over to the office.  Please call.

## 2018-12-17 NOTE — Telephone Encounter (Signed)
Called patient to review Thursday given cardiology clearance as well as CT scan showing no evidence of metastatic disease.  Patient amenable to this plan.  Discussed preoperative Covid testing again.  She now has the information for when to start her Lovenox bridge preoperatively.  Jeral Pinch MD Gynecologic Oncology

## 2018-12-17 NOTE — Telephone Encounter (Signed)
Lovenox prior auth sent with urgent notification. First dose of Lovenox not needed until 12/13.

## 2018-12-17 NOTE — Telephone Encounter (Signed)
Called and spoke to [employer]. She states that the patient will need a Prior Authorization for the lovenox. She states that the patient's plan has some limitations. She states that they faxed over paperwork to [phone]. Will forward to PharmD for review as she is being bridged for her upcoming procedure.

## 2018-12-18 ENCOUNTER — Ambulatory Visit (HOSPITAL_COMMUNITY): Payer: Medicare HMO | Admitting: Physician Assistant

## 2018-12-18 NOTE — Telephone Encounter (Addendum)
Lovenox prior authorization has been approved. Confirmed with pharmacy they can process rx. Stated copay is > $220 due to deductible, provided them with GoodRx coupon which brought price down to $169 for 20 syringes.

## 2018-12-20 ENCOUNTER — Ambulatory Visit: Payer: Medicare HMO | Admitting: Gynecologic Oncology

## 2018-12-21 ENCOUNTER — Encounter (HOSPITAL_COMMUNITY): Payer: Self-pay

## 2018-12-21 NOTE — Patient Instructions (Addendum)
DUE TO COVID-19 ONLY ONE VISITOR IS ALLOWED TO COME WITH YOU AND STAY IN THE WAITING ROOM ONLY DURING PRE OP AND PROCEDURE. THE ONE VISITOR MAY VISIT WITH YOU IN YOUR PRIVATE ROOM DURING VISITING HOURS ONLY!!   COVID SWAB TESTING MUST BE COMPLETED ON:  Today, Immediately after pre op appointment    [address] [facility] enter pre surgical testing line (Must self quarantine after testing. Follow instructions on handout.)         Your procedure is scheduled on: Thursday, Dec. 17, 2020   Report to [facility] Main  Entrance   Report to Short Stay at 5:30 AM   Call this number if you have problems the morning of surgery [phone]    Bring CPAP mask and tubing day of surgery   Do not eat food:After Midnight.   May have liquids until 4:30 AM day of surgery   CLEAR LIQUID DIET  Foods Allowed                                                                     Foods Excluded  Water, Black Coffee and tea, regular and decaf                             liquids that you cannot  Plain Jell-O in any flavor  (No red)                                           see through such as: Fruit ices (not with fruit pulp)                                     milk, soups, orange juice  Iced Popsicles (No red)                                    All solid food Carbonated beverages, regular and diet                                    Apple juices Sports drinks like Gatorade (No red) Lightly seasoned clear broth or consume(fat free) Sugar, honey syrup  Sample Menu Breakfast                                Lunch                                     Supper Cranberry juice                    Beef broth  Chicken broth Jell-O                                     Grape juice                           Apple juice Coffee or tea                        Jell-O                                      Popsicle                                                 Coffee or tea                        Coffee or tea   Brush your teeth the morning of surgery.   Do NOT smoke after Midnight   Take these medicines the morning of surgery with A SIP OF WATER: Carvedilol   Take half (1/2) usual dose of Lantus morning of surgery                               You may not have any metal on your body including hair pins, jewelry, and body piercings             Do not wear make-up, lotions, powders, perfumes/cologne, or deodorant             Do not wear nail polish.  Do not shave  48 hours prior to surgery.               Do not bring valuables to the hospital. [place].   Contacts, dentures or bridgework may not be worn into surgery.   Bring small overnight bag day of surgery.    Special Instructions: Bring a copy of your healthcare power of attorney and living will documents         the day of surgery if you haven't scanned them in before.              Please read over the following fact sheets you were given:  How to Manage Your Diabetes Before and After Surgery  Why is it important to control my blood sugar before and after surgery? . Improving blood sugar levels before and after surgery helps healing and can limit problems. . A way of improving blood sugar control is eating a healthy diet by: o  Eating less sugar and carbohydrates o  Increasing activity/exercise o  Talking with your doctor about reaching your blood sugar goals . High blood sugars (greater than 180 mg/dL) can raise your risk of infections and slow your recovery, so you will need to focus on controlling your diabetes during the weeks before surgery. . Make sure that the doctor who takes care of your diabetes knows about your planned surgery including the date  and location.  How do I manage my blood sugar before surgery? . Check your blood sugar at least 4 times a day, starting 2 days before surgery, to make sure that the level is  not too high or low. o Check your blood sugar the morning of your surgery when you wake up and every 2 hours until you get to the Short Stay unit. . If your blood sugar is less than 70 mg/dL, you will need to treat for low blood sugar: o Do not take insulin. o Treat a low blood sugar (less than 70 mg/dL) with  cup of clear juice (cranberry or apple), 4 glucose tablets, OR glucose gel. o Recheck blood sugar in 15 minutes after treatment (to make sure it is greater than 70 mg/dL). If your blood sugar is not greater than 70 mg/dL on recheck, call [phone] for further instructions. . Report your blood sugar to the short stay nurse when you get to Short Stay.  . If you are admitted to the hospital after surgery: o Your blood sugar will be checked by the staff and you will probably be given insulin after surgery (instead of oral diabetes medicines) to make sure you have good blood sugar levels. o The goal for blood sugar control after surgery is 80-180 mg/dL.   WHAT DO I DO ABOUT MY DIABETES MEDICATION?   THE DAY BEFORE SURGERY TAKE FULL MORNING DOSE OF LANTUS AND METFORMIN PER NORMAL ROUTINE  . THE NIGHT BEFORE SURGERY, take  17.5   units of  LANTUS     insulin.       . THE MORNING OF SURGERY, take 15   units of LANTUS         insulin.   Do not take oral diabetes medicines (pills) the morning of surgery.  . If your CBG is greater than 220 mg/dL, you may take  of your sliding scale  . (correction) dose of insulin.    Reviewed and Endorsed by [facility] Patient Education Committee, August 2015  [facility] - Preparing for Surgery Before surgery, you can play an important role.  Because skin is not sterile, your skin needs to be as free of germs as possible.  You can reduce the number of germs on your skin by washing with CHG (chlorahexidine gluconate) soap before surgery.  CHG is an antiseptic cleaner which kills germs and bonds with the skin to continue killing germs even after  washing. Please DO NOT use if you have an allergy to CHG or antibacterial soaps.  If your skin becomes reddened/irritated stop using the CHG and inform your nurse when you arrive at Short Stay. Do not shave (including legs and underarms) for at least 48 hours prior to the first CHG shower.  You may shave your face/neck.  Please follow these instructions carefully:  1.  Shower with CHG Soap the night before surgery and the  morning of surgery.  2.  If you choose to wash your hair, wash your hair first as usual with your normal  shampoo.  3.  After you shampoo, rinse your hair and body thoroughly to remove the shampoo.                             4.  Use CHG as you would any other liquid soap.  You can apply chg directly to the skin and wash.  Gently with a scrungie or clean washcloth.  5.  Apply the CHG Soap to your body ONLY FROM THE NECK DOWN.   Do   not use on face/ open                           Wound or open sores. Avoid contact with eyes, ears mouth and   genitals (private parts).                       Wash face,  Genitals (private parts) with your normal soap.             6.  Wash thoroughly, paying special attention to the area where your    surgery  will be performed.  7.  Thoroughly rinse your body with warm water from the neck down.  8.  DO NOT shower/wash with your normal soap after using and rinsing off the CHG Soap.                9.  Pat yourself dry with a clean towel.            10.  Wear clean pajamas.            11.  Place clean sheets on your bed the night of your first shower and do not  sleep with pets. Day of Surgery : Do not apply any lotions/deodorants the morning of surgery.  Please wear clean clothes to the hospital/surgery center.  FAILURE TO FOLLOW THESE INSTRUCTIONS MAY RESULT IN THE CANCELLATION OF YOUR SURGERY  PATIENT SIGNATURE_________________________________  NURSE  SIGNATURE__________________________________  ________________________________________________________________________   Adam Phenix  An incentive spirometer is a tool that can help keep your lungs clear and active. This tool measures how well you are filling your lungs with each breath. Taking long deep breaths may help reverse or decrease the chance of developing breathing (pulmonary) problems (especially infection) following:  A long period of time when you are unable to move or be active. BEFORE THE PROCEDURE   If the spirometer includes an indicator to show your best effort, your nurse or respiratory therapist will set it to a desired goal.  If possible, sit up straight or lean slightly forward. Try not to slouch.  Hold the incentive spirometer in an upright position. INSTRUCTIONS FOR USE  1. Sit on the edge of your bed if possible, or sit up as far as you can in bed or on a chair. 2. Hold the incentive spirometer in an upright position. 3. Breathe out normally. 4. Place the mouthpiece in your mouth and seal your lips tightly around it. 5. Breathe in slowly and as deeply as possible, raising the piston or the ball toward the top of the column. 6. Hold your breath for 3-5 seconds or for as long as possible. Allow the piston or ball to fall to the bottom of the column. 7. Remove the mouthpiece from your mouth and breathe out normally. 8. Rest for a few seconds and repeat Steps 1 through 7 at least 10 times every 1-2 hours when you are awake. Take your time and take a few normal breaths between deep breaths. 9. The spirometer may include an indicator to show your best effort. Use the indicator as a goal to work toward during each repetition. 10. After each set of 10 deep breaths, practice coughing to be sure your lungs are clear. If you have an incision (the cut made at the time of surgery), support your incision  when coughing by placing a pillow or rolled up towels firmly  against it. Once you are able to get out of bed, walk around indoors and cough well. You may stop using the incentive spirometer when instructed by your caregiver.  RISKS AND COMPLICATIONS  Take your time so you do not get dizzy or light-headed.  If you are in pain, you may need to take or ask for pain medication before doing incentive spirometry. It is harder to take a deep breath if you are having pain. AFTER USE  Rest and breathe slowly and easily.  It can be helpful to keep track of a log of your progress. Your caregiver can provide you with a simple table to help with this. If you are using the spirometer at home, follow these instructions: [place] IF:   You are having difficultly using the spirometer.  You have trouble using the spirometer as often as instructed.  Your pain medication is not giving enough relief while using the spirometer.  You develop fever of 100.5 F (38.1 C) or higher. SEEK IMMEDIATE MEDICAL CARE IF:   You cough up bloody sputum that had not been present before.  You develop fever of 102 F (38.9 C) or greater.  You develop worsening pain at or near the incision site. MAKE SURE YOU:   Understand these instructions.  Will watch your condition.  Will get help right away if you are not doing well or get worse. Document Released: 05/09/2006 Document Revised: 03/21/2011 Document Reviewed: 07/10/2006 ExitCare Patient Information 2014 ExitCare, [place].   ________________________________________________________________________  WHAT IS A BLOOD TRANSFUSION? Blood Transfusion Information  A transfusion is the replacement of blood or some of its parts. Blood is made up of multiple cells which provide different functions.  Red blood cells carry oxygen and are used for blood loss replacement.  White blood cells fight against infection.  Platelets control bleeding.  Plasma helps clot blood.  Other blood products are available for  specialized needs, such as hemophilia or other clotting disorders. BEFORE THE TRANSFUSION  Who gives blood for transfusions?   Healthy volunteers who are fully evaluated to make sure their blood is safe. This is blood bank blood. Transfusion therapy is the safest it has ever been in the practice of medicine. Before blood is taken from a donor, a complete history is taken to make sure that person has no history of diseases nor engages in risky social behavior (examples are intravenous drug use or sexual activity with multiple partners). The donor's travel history is screened to minimize risk of transmitting infections, such as malaria. The donated blood is tested for signs of infectious diseases, such as HIV and hepatitis. The blood is then tested to be sure it is compatible with you in order to minimize the chance of a transfusion reaction. If you or a relative donates blood, this is often done in anticipation of surgery and is not appropriate for emergency situations. It takes many days to process the donated blood. RISKS AND COMPLICATIONS Although transfusion therapy is very safe and saves many lives, the main dangers of transfusion include:   Getting an infectious disease.  Developing a transfusion reaction. This is an allergic reaction to something in the blood you were given. Every precaution is taken to prevent this. The decision to have a blood transfusion has been considered carefully by your caregiver before blood is given. Blood is not given unless the benefits outweigh the risks. AFTER THE TRANSFUSION  Right after  receiving a blood transfusion, you will usually feel much better and more energetic. This is especially true if your red blood cells have gotten low (anemic). The transfusion raises the level of the red blood cells which carry oxygen, and this usually causes an energy increase.  The nurse administering the transfusion will monitor you carefully for complications. HOME CARE  INSTRUCTIONS  No special instructions are needed after a transfusion. You may find your energy is better. Speak with your caregiver about any limitations on activity for underlying diseases you may have. SEEK MEDICAL CARE IF:   Your condition is not improving after your transfusion.  You develop redness or irritation at the intravenous (IV) site. SEEK IMMEDIATE MEDICAL CARE IF:  Any of the following symptoms occur over the next 12 hours:  Shaking chills.  You have a temperature by mouth above 102 F (38.9 C), not controlled by medicine.  Chest, back, or muscle pain.  People around you feel you are not acting correctly or are confused.  Shortness of breath or difficulty breathing.  Dizziness and fainting.  You get a rash or develop hives.  You have a decrease in urine output.  Your urine turns a dark color or changes to pink, red, or brown. Any of the following symptoms occur over the next 10 days:  You have a temperature by mouth above 102 F (38.9 C), not controlled by medicine.  Shortness of breath.  Weakness after normal activity.  The white part of the eye turns yellow (jaundice).  You have a decrease in the amount of urine or are urinating less often.  Your urine turns a dark color or changes to pink, red, or brown. Document Released: 12/25/1999 Document Revised: 03/21/2011 Document Reviewed: 08/13/2007 [facility] Patient Information 2014 [place], [place].  _______________________________________________________________________

## 2018-12-24 ENCOUNTER — Other Ambulatory Visit (HOSPITAL_COMMUNITY): Payer: Medicare HMO

## 2018-12-24 ENCOUNTER — Other Ambulatory Visit (HOSPITAL_COMMUNITY)
Admission: RE | Admit: 2018-12-24 | Discharge: 2018-12-24 | Disposition: A | Discharge status: Home / Self Care | Payer: Medicare HMO | Source: Ambulatory Visit | Attending: Gynecologic Oncology | Admitting: Gynecologic Oncology

## 2018-12-24 ENCOUNTER — Encounter (HOSPITAL_COMMUNITY): Payer: Self-pay

## 2018-12-24 ENCOUNTER — Other Ambulatory Visit: Payer: Self-pay

## 2018-12-24 ENCOUNTER — Encounter (HOSPITAL_COMMUNITY)
Admission: RE | Admit: 2018-12-24 | Discharge: 2018-12-24 | Disposition: A | Discharge status: Home / Self Care | Payer: Medicare HMO | Source: Ambulatory Visit | Attending: Gynecologic Oncology | Admitting: Gynecologic Oncology

## 2018-12-24 DIAGNOSIS — I081 Rheumatic disorders of both mitral and tricuspid valves: Secondary | ICD-10-CM | POA: Diagnosis not present

## 2018-12-24 DIAGNOSIS — I447 Left bundle-branch block, unspecified: Secondary | ICD-10-CM | POA: Diagnosis not present

## 2018-12-24 DIAGNOSIS — Z7901 Long term (current) use of anticoagulants: Secondary | ICD-10-CM | POA: Diagnosis not present

## 2018-12-24 DIAGNOSIS — I700 Atherosclerosis of aorta: Secondary | ICD-10-CM | POA: Diagnosis not present

## 2018-12-24 DIAGNOSIS — Z794 Long term (current) use of insulin: Secondary | ICD-10-CM | POA: Insufficient documentation

## 2018-12-24 DIAGNOSIS — C55 Malignant neoplasm of uterus, part unspecified: Secondary | ICD-10-CM | POA: Insufficient documentation

## 2018-12-24 DIAGNOSIS — E669 Obesity, unspecified: Secondary | ICD-10-CM | POA: Insufficient documentation

## 2018-12-24 DIAGNOSIS — Z20828 Contact with and (suspected) exposure to other viral communicable diseases: Secondary | ICD-10-CM | POA: Diagnosis not present

## 2018-12-24 DIAGNOSIS — I110 Hypertensive heart disease with heart failure: Secondary | ICD-10-CM | POA: Insufficient documentation

## 2018-12-24 DIAGNOSIS — G4730 Sleep apnea, unspecified: Secondary | ICD-10-CM | POA: Diagnosis not present

## 2018-12-24 DIAGNOSIS — E119 Type 2 diabetes mellitus without complications: Secondary | ICD-10-CM | POA: Diagnosis not present

## 2018-12-24 DIAGNOSIS — I4819 Other persistent atrial fibrillation: Secondary | ICD-10-CM | POA: Insufficient documentation

## 2018-12-24 DIAGNOSIS — I5032 Chronic diastolic (congestive) heart failure: Secondary | ICD-10-CM | POA: Insufficient documentation

## 2018-12-24 DIAGNOSIS — J45909 Unspecified asthma, uncomplicated: Secondary | ICD-10-CM | POA: Insufficient documentation

## 2018-12-24 DIAGNOSIS — Z01812 Encounter for preprocedural laboratory examination: Secondary | ICD-10-CM | POA: Diagnosis not present

## 2018-12-24 DIAGNOSIS — Z6836 Body mass index (BMI) 36.0-36.9, adult: Secondary | ICD-10-CM | POA: Diagnosis not present

## 2018-12-24 DIAGNOSIS — I4892 Unspecified atrial flutter: Secondary | ICD-10-CM | POA: Diagnosis not present

## 2018-12-24 DIAGNOSIS — Z9851 Tubal ligation status: Secondary | ICD-10-CM | POA: Diagnosis not present

## 2018-12-24 DIAGNOSIS — Z79899 Other long term (current) drug therapy: Secondary | ICD-10-CM | POA: Insufficient documentation

## 2018-12-24 DIAGNOSIS — Z8673 Personal history of transient ischemic attack (TIA), and cerebral infarction without residual deficits: Secondary | ICD-10-CM | POA: Insufficient documentation

## 2018-12-24 HISTORY — DX: Malignant neoplasm of uterus, part unspecified: C55

## 2018-12-24 HISTORY — DX: Abnormal result of cardiovascular function study, unspecified: R94.30

## 2018-12-24 HISTORY — DX: Reserved for concepts with insufficient information to code with codable children: IMO0002

## 2018-12-24 HISTORY — DX: Atherosclerosis of aorta: I70.0

## 2018-12-24 HISTORY — DX: Cardiomegaly: I51.7

## 2018-12-24 LAB — URINALYSIS, ROUTINE W REFLEX MICROSCOPIC
Bilirubin Urine: NEGATIVE
Glucose, UA: NEGATIVE mg/dL
Ketones, ur: 5 mg/dL — AB
Leukocytes,Ua: NEGATIVE
Nitrite: NEGATIVE
Protein, ur: NEGATIVE mg/dL
Specific Gravity, Urine: 1.016 (ref 1.005–1.030)
pH: 5 (ref 5.0–8.0)

## 2018-12-24 LAB — CBC
HCT: 36.2 % (ref 36.0–46.0)
Hemoglobin: 11.3 g/dL — ABNORMAL LOW (ref 12.0–15.0)
MCH: 29.8 pg (ref 26.0–34.0)
MCHC: 31.2 g/dL (ref 30.0–36.0)
MCV: 95.5 fL (ref 80.0–100.0)
Platelets: 188 10*3/uL (ref 150–400)
RBC: 3.79 MIL/uL — ABNORMAL LOW (ref 3.87–5.11)
RDW: 12.8 % (ref 11.5–15.5)
WBC: 5.9 10*3/uL (ref 4.0–10.5)
nRBC: 0 % (ref 0.0–0.2)

## 2018-12-24 LAB — GLUCOSE, CAPILLARY: Glucose-Capillary: 102 mg/dL — ABNORMAL HIGH (ref 70–99)

## 2018-12-24 NOTE — Progress Notes (Signed)
PCP - N. [name] P.A. Cardiologist - Dr. Lendell Caprice last office visit 12/14/2018 in epic  Chest x-ray - greater than 1 year  EKG - 09/18/2018 in epic Stress Test - greater than 2 years ECHO - 10/10/2016 in epic Cardiac Cath - greater than 2 years  Sleep Study - Yes CPAP - does not wear CPAP  Fasting Blood Sugar - 225 Checks Blood Sugar __once___ daily  Blood Thinner Instructions:Lovenox started Sunday 12/23/2018 Last dose of Warfarin 12/21/2018 Aspirin Instructions: N/A Last Dose: N/A  Anesthesia review: EF 35-40%, Afib, OSA, CHF, DM, HTN, MAZE, Mitral and tricuspid valve replacement  Patient denies shortness of breath, fever, cough and chest pain at PAT appointment   Patient verbalized understanding of instructions that were given to them at the PAT appointment. Patient was also instructed that they will need to review over the PAT instructions again at home before surgery.

## 2018-12-25 ENCOUNTER — Telehealth: Payer: Self-pay | Admitting: Pharmacist

## 2018-12-25 LAB — CA 125: Cancer Antigen (CA) 125: 17.4 U/mL (ref 0.0–38.1)

## 2018-12-25 LAB — NOVEL CORONAVIRUS, NAA (HOSP ORDER, SEND-OUT TO REF LAB; TAT 18-24 HRS): SARS-CoV-2, NAA: NOT DETECTED

## 2018-12-25 NOTE — Telephone Encounter (Signed)
Spoke with Dr. Berline Lopes about concerns about starting therapuetic lovenox on post opt day 1. Patient has a history of CVA and mechanical valve replacement and is at high risk of stroke. Would prefer she start therapeutic lovenox on post op day 1. Per Dr. Berline Lopes if surgery is minimum invasive and she looks pretty dry afterwards she is ok discharging her same day with therapeutic lovenox post op day 1. If there is more bleeding she will see if she can admit patient overnight for observation, get a CBC in the AM and if stable can start therapeutic lovenox post op day 1.

## 2018-12-25 NOTE — Progress Notes (Signed)
Anesthesia Chart Review   Case: [id number] Date/Time: 12/27/18 0700   Procedures:      XI ROBOTIC ASSISTED TOTAL HYSTERECTOMY WITH BILATERAL SALPINGO OOPHORECTOMY (Bilateral )     SENTINEL NODE BIOPSY (N/A )   Anesthesia type: General   Pre-op diagnosis: UTERINE CANCER   Location: [facility] ROOM 02 / [facility]   Surgeons: [name], MD      DISCUSSION:63 y.o. never smoker with h/o asthma, HTN, CHF, atrial fibrillation s/p MAZE, DM II insulin dependent, sleep apnea w/o device, s/p mitral valve replacement, tricuspid valve repair, LBBB, Stroke with residual left arm weakness, uterine cancer scheduled for above procedure 12/27/2018 with Dr. Jeral Pinch.   On Warfarin, will hold 12/21/2018.  Will start Lovenox bridge 12/23/2018.  Instructions have been given to patient.   Pt last seen by cardiologist, Dr. Larae Grooms, 12/14/2018.  Per OV note, " 1. S/p MVR: COumadin to prevent valve thrombosis. Plans in place for Lovenox bridge at the time of surgery. 2. PAF: Coumadin or stroke prevention.  3. Chronic systolic heart failure: Appears well compensated/euvolemic. 4. Preoperative eval: No further cardiac testing needed at this time.  She walks a fair amount without cardiac symptoms.    5. Anticoagulated: Followed by PharmD."  Anticipate pt can proceed with planned procedure barring acute status change.   VS: BP (!) 152/72   Pulse 71   Temp 36.8 C (Oral)   Resp 17   Ht 5\' 6"  (1.676 m)   Wt 102.1 kg   SpO2 100%   BMI 36.32 kg/m   PROVIDERS: Lennie Odor, PA-C is PCP   Casandra Doffing, MD is Cardiologist  LABS: Labs reviewed: Acceptable for surgery. (all labs ordered are listed, but only abnormal results are displayed)  Labs Reviewed  CBC - Abnormal; Notable for the following components:      Result Value   RBC 3.79 (*)    Hemoglobin 11.3 (*)    All other components within normal limits  URINALYSIS, ROUTINE W REFLEX MICROSCOPIC - Abnormal; Notable for the following  components:   Hgb urine dipstick SMALL (*)    Ketones, ur 5 (*)    Bacteria, UA RARE (*)    All other components within normal limits  GLUCOSE, CAPILLARY - Abnormal; Notable for the following components:   Glucose-Capillary 102 (*)    All other components within normal limits  CA 125  TYPE AND SCREEN     IMAGES:   EKG: 09/18/2018 Rate 67 bpm Left axis deviation  Left bundle branch block  Junctional rhythm   CV: Echo 10/10/2016 Study Conclusions  - Left ventricle: The cavity size was normal. Wall thickness was   increased in a pattern of moderate LVH. Systolic function was   moderately reduced. The estimated ejection fraction was in the   range of 35% to 40%. There is akinesis of the anteroseptal   myocardium. - Aortic valve: There was mild regurgitation. - Mitral valve: A mechanical prosthesis was present. - Left atrium: The atrium was severely dilated. 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 - Tricuspid valve: Prior procedures included surgical repair. - Pulmonary arteries: Systolic pressure was moderately increased.   PA peak pressure: 49 mm Hg (S).  Impressions:  - Akinesis of the septum with overall moderate LV dysfunction; mild   AI; s/p MVR with trace MR; severe LAE; s/p TV repair with mild   TR; moderately elevated pulmonary pressure. Past Medical History:  Diagnosis Date  . Abnormal chest CT   .  Aortic atherosclerosis ([place])   . Asthma   . Atrial enlargement, left    severe  . Atrial fibrillation ([place]) 01/05/2011   Chronic persistent, failed DCCV   . Atrial flutter ([place])   . Bronchospasm 1998  . Cardiomegaly   . Carotid bruit   . Chronic diastolic congestive heart failure ([place])   . Diabetes mellitus    insulin dependent  . Ejection fraction < 50%    35-40%,   . Heart murmur   . History of blood transfusion   . Hypercholesterolemia   . Hypertension   . Junctional rhythm 09/14/2018   Noted on EKG  . Left bundle branch block  (LBBB) 09/14/2018   Noted on EKG  . LVH (left ventricular hypertrophy) 10/10/2016   Moderate, Noted on ECHO  . Mitral regurgitation   . Obesity   . Obesity (BMI 30-39.9) 01/16/2012  . Persistent atrial fibrillation ([place])   . Polyp of rectum   . Rheumatic fever 01/16/2012   Reported during childhood  . S/P Maze operation for atrial fibrillation 02/15/2012   Complete biatrial lesion set using bipolar radiofrequency and cryothermy ablation with clipping of LA appendage  . S/P mitral valve replacement with metallic valve [id number]   70mm Sorin [employer]omedics Optiform mechanical prosthesis  . 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 Shortness of breath    with exertion  . Sleep apnea    DOES NOT HAVE CPAP  . Stroke [facility]) 2014   Post op , left arm weakness  . Tricuspid regurgitation 01/16/2012  . Uterine cancer [facility])     Past Surgical History:  Procedure Laterality Date  . CARDIAC CATHETERIZATION  >5 years  . CARDIOVASCULAR STRESS TEST  09/2010  . CARDIOVERSION  03/25/2011   Procedure: CARDIOVERSION;  Surgeon: Jettie Booze, MD;  Location: [place];  Service: Cardiovascular;  Laterality: N/A;  . CARDIOVERSION N/A 07/27/2012   Procedure: CARDIOVERSION;  Surgeon: Jettie Booze, MD;  Location: [place];  Service: Cardiovascular;  Laterality: N/A;  . CESAREAN SECTION    . COLONOSCOPY WITH PROPOFOL N/A 04/27/2016   Procedure: COLONOSCOPY WITH PROPOFOL;  Surgeon: Wilford Corner, MD;  Location: [facility] ENDOSCOPY;  Service: Endoscopy;  Laterality: N/A;  . INTRAOPERATIVE TRANSESOPHAGEAL ECHOCARDIOGRAM  02/15/2012   Procedure: INTRAOPERATIVE TRANSESOPHAGEAL ECHOCARDIOGRAM;  Surgeon: Rexene Alberts, MD;  Location: [place];  Service: Open Heart Surgery;  Laterality: N/A;  . MAZE  02/15/2012   Procedure: MAZE;  Surgeon: Rexene Alberts, MD;  Location: [place];  Service: Open Heart Surgery;  Laterality: N/A;  . MITRAL VALVE REPLACEMENT  02/15/2012   Procedure: MITRAL  VALVE (MV) REPLACEMENT;  Surgeon: Rexene Alberts, MD;  Location: [place];  Service: Open Heart Surgery;  Laterality: N/A;  . TEE WITHOUT CARDIOVERSION  12/21/2011   Procedure: TRANSESOPHAGEAL ECHOCARDIOGRAM (TEE);  Surgeon: Jettie Booze, MD;  Location: [facility] ENDOSCOPY;  Service: Cardiovascular;  Laterality: N/A;  . TRICUSPID VALVE REPLACEMENT  02/15/2012   Procedure: TRICUSPID VALVE REPAIR;  Surgeon: Rexene Alberts, MD;  Location: [place];  Service: Open Heart Surgery;  Laterality: N/A;  . TUBAL LIGATION     at time of her c-section    MEDICATIONS: . amiodarone (PACERONE) 200 MG tablet  . amLODipine (NORVASC) 5 MG tablet  . atorvastatin (LIPITOR) 40 MG tablet  . calcium carbonate (OS-CAL - DOSED IN MG OF ELEMENTAL CALCIUM) 1250 MG tablet  . carvedilol (COREG) 12.5 MG tablet  . Cholecalciferol (VITAMIN D) 1000  UNITS capsule  . cyclobenzaprine (FLEXERIL) 10 MG tablet  . enoxaparin (LOVENOX) 100 MG/ML injection  . furosemide (LASIX) 40 MG tablet  . insulin glargine (LANTUS SOLOSTAR) 100 UNIT/ML injection  . irbesartan (AVAPRO) 300 MG tablet  . metFORMIN (GLUCOPHAGE) 500 MG tablet  . methocarbamol (ROBAXIN) 500 MG tablet  . potassium chloride SA (K-DUR,KLOR-CON) 20 MEQ tablet  . warfarin (COUMADIN) 4 MG tablet   No current facility-administered medications for this encounter.     Maia Plan [facility] Pre-Surgical Testing [phone] 12/25/18  3:43 PM

## 2018-12-25 NOTE — Anesthesia Preprocedure Evaluation (Deleted)
Anesthesia Evaluation  Patient identified by MRN, date of birth, ID band Patient awake    Reviewed: Allergy & Precautions, H&P , NPO status , Patient's Chart, lab work & pertinent test results  Airway Mallampati: II   Neck ROM: full    Dental   Pulmonary shortness of breath, asthma , sleep apnea ,    breath sounds clear to auscultation       Cardiovascular hypertension, +CHF  + dysrhythmias Atrial Fibrillation + Valvular Problems/Murmurs  Rhythm:regular Rate:Normal  S/p MV replacement, TV repair.   TTE: EF 35-40%   Neuro/Psych CVA    GI/Hepatic   Endo/Other  diabetes, Type 2  Renal/GU      Musculoskeletal   Abdominal   Peds  Hematology   Anesthesia Other Findings   Reproductive/Obstetrics                            Anesthesia Physical Anesthesia Plan  ASA: III  Anesthesia Plan: General   Post-op Pain Management:    Induction: Intravenous  PONV Risk Score and Plan: 3 and Ondansetron, Dexamethasone, Midazolam, Treatment may vary due to age or medical condition and Scopolamine patch - Pre-op  Airway Management Planned: Oral ETT  Additional Equipment:   Intra-op Plan:   Post-operative Plan: Extubation in OR  Informed Consent: I have reviewed the patients History and Physical, chart, labs and discussed the procedure including the risks, benefits and alternatives for the proposed anesthesia with the patient or authorized representative who has indicated his/her understanding and acceptance.       Plan Discussed with: CRNA, Anesthesiologist and Surgeon  Anesthesia Plan Comments: (See PAT note 12/24/2018, Konrad Felix, PA-C)       Anesthesia Quick Evaluation

## 2018-12-26 ENCOUNTER — Telehealth: Payer: Self-pay

## 2018-12-26 ENCOUNTER — Encounter: Payer: Self-pay | Admitting: Gynecologic Oncology

## 2018-12-26 NOTE — Progress Notes (Signed)
Dr. Berline Lopes spoke with [name] with Pharmacy about post-op lovenox. Given the patient's high risk and stroke history, the plan will be to keep the patient overnight, check CBC in the am, if stable then plan to start therapeutic lovenox on POD1 and discharge home.

## 2018-12-26 NOTE — Telephone Encounter (Signed)
I spoke with [name].  I told her not to take lovenox this pm.  I told her she would be staying overnight at the hospital for monitoring secondary to the lovenox.  She verbalized understanding.

## 2018-12-27 ENCOUNTER — Ambulatory Visit (HOSPITAL_COMMUNITY)
Admission: RE | Admit: 2018-12-27 | Discharge: 2018-12-27 | Disposition: A | Discharge status: Home / Self Care | Payer: Medicare HMO | Attending: Gynecologic Oncology | Admitting: Gynecologic Oncology

## 2018-12-27 ENCOUNTER — Encounter (HOSPITAL_COMMUNITY): Payer: Self-pay | Admitting: Gynecologic Oncology

## 2018-12-27 ENCOUNTER — Telehealth: Payer: Self-pay | Admitting: Pharmacist

## 2018-12-27 ENCOUNTER — Encounter (HOSPITAL_COMMUNITY)
Admission: RE | Disposition: A | Discharge status: Home / Self Care | Payer: Self-pay | Source: Home / Self Care | Attending: Gynecologic Oncology

## 2018-12-27 ENCOUNTER — Other Ambulatory Visit: Payer: Self-pay

## 2018-12-27 DIAGNOSIS — Z6837 Body mass index (BMI) 37.0-37.9, adult: Secondary | ICD-10-CM | POA: Insufficient documentation

## 2018-12-27 DIAGNOSIS — I4891 Unspecified atrial fibrillation: Secondary | ICD-10-CM | POA: Diagnosis not present

## 2018-12-27 DIAGNOSIS — C541 Malignant neoplasm of endometrium: Secondary | ICD-10-CM | POA: Insufficient documentation

## 2018-12-27 DIAGNOSIS — J45909 Unspecified asthma, uncomplicated: Secondary | ICD-10-CM | POA: Diagnosis not present

## 2018-12-27 DIAGNOSIS — Z539 Procedure and treatment not carried out, unspecified reason: Secondary | ICD-10-CM | POA: Insufficient documentation

## 2018-12-27 DIAGNOSIS — E119 Type 2 diabetes mellitus without complications: Secondary | ICD-10-CM | POA: Diagnosis not present

## 2018-12-27 DIAGNOSIS — Z7901 Long term (current) use of anticoagulants: Secondary | ICD-10-CM | POA: Insufficient documentation

## 2018-12-27 DIAGNOSIS — C55 Malignant neoplasm of uterus, part unspecified: Secondary | ICD-10-CM | POA: Diagnosis present

## 2018-12-27 DIAGNOSIS — Z79899 Other long term (current) drug therapy: Secondary | ICD-10-CM | POA: Diagnosis not present

## 2018-12-27 DIAGNOSIS — E7800 Pure hypercholesterolemia, unspecified: Secondary | ICD-10-CM | POA: Insufficient documentation

## 2018-12-27 DIAGNOSIS — I890 Lymphedema, not elsewhere classified: Secondary | ICD-10-CM | POA: Insufficient documentation

## 2018-12-27 DIAGNOSIS — G4730 Sleep apnea, unspecified: Secondary | ICD-10-CM | POA: Diagnosis not present

## 2018-12-27 DIAGNOSIS — I5032 Chronic diastolic (congestive) heart failure: Secondary | ICD-10-CM | POA: Insufficient documentation

## 2018-12-27 DIAGNOSIS — Z952 Presence of prosthetic heart valve: Secondary | ICD-10-CM | POA: Diagnosis not present

## 2018-12-27 DIAGNOSIS — I110 Hypertensive heart disease with heart failure: Secondary | ICD-10-CM | POA: Diagnosis not present

## 2018-12-27 DIAGNOSIS — Z8249 Family history of ischemic heart disease and other diseases of the circulatory system: Secondary | ICD-10-CM | POA: Diagnosis not present

## 2018-12-27 DIAGNOSIS — Z794 Long term (current) use of insulin: Secondary | ICD-10-CM | POA: Diagnosis not present

## 2018-12-27 DIAGNOSIS — E669 Obesity, unspecified: Secondary | ICD-10-CM | POA: Insufficient documentation

## 2018-12-27 LAB — PROTIME-INR
INR: 2.2 — ABNORMAL HIGH (ref 0.8–1.2)
Prothrombin Time: 24.6 seconds — ABNORMAL HIGH (ref 11.4–15.2)

## 2018-12-27 LAB — APTT: aPTT: 55 seconds — ABNORMAL HIGH (ref 24–36)

## 2018-12-27 LAB — GLUCOSE, CAPILLARY: Glucose-Capillary: 80 mg/dL (ref 70–99)

## 2018-12-27 SURGERY — HYSTERECTOMY, TOTAL, ROBOT-ASSISTED, LAPAROSCOPIC, WITH BILATERAL SALPINGO-OOPHORECTOMY
Anesthesia: General

## 2018-12-27 MED ORDER — ONDANSETRON HCL 4 MG/2ML IJ SOLN
INTRAMUSCULAR | Status: AC
  Filled 2018-12-27: qty 2

## 2018-12-27 MED ORDER — DEXAMETHASONE SODIUM PHOSPHATE 10 MG/ML IJ SOLN
INTRAMUSCULAR | Status: AC
  Filled 2018-12-27: qty 1

## 2018-12-27 MED ORDER — SUGAMMADEX SODIUM 500 MG/5ML IV SOLN
INTRAVENOUS | Status: AC
  Filled 2018-12-27: qty 5

## 2018-12-27 MED ORDER — BUPIVACAINE HCL 0.25 % IJ SOLN
INTRAMUSCULAR | Status: AC
  Filled 2018-12-27: qty 1

## 2018-12-27 MED ORDER — CARVEDILOL 12.5 MG PO TABS
12.5000 mg | ORAL_TABLET | Freq: Two times a day (BID) | ORAL | Status: DC
  Administered 2018-12-27: 12.5 mg via ORAL
  Filled 2018-12-27: qty 1

## 2018-12-27 MED ORDER — DEXAMETHASONE SODIUM PHOSPHATE 4 MG/ML IJ SOLN: 4.0000 mg | INTRAMUSCULAR | Status: DC

## 2018-12-27 MED ORDER — ROCURONIUM BROMIDE 10 MG/ML (PF) SYRINGE
PREFILLED_SYRINGE | INTRAVENOUS | Status: AC
  Filled 2018-12-27: qty 10

## 2018-12-27 MED ORDER — HEPARIN SODIUM (PORCINE) 5000 UNIT/ML IJ SOLN
5000.0000 [IU] | INTRAMUSCULAR | Status: AC
  Administered 2018-12-27: 5000 [IU] via SUBCUTANEOUS
  Filled 2018-12-27: qty 1

## 2018-12-27 MED ORDER — KETAMINE HCL 10 MG/ML IJ SOLN
INTRAMUSCULAR | Status: AC
  Filled 2018-12-27: qty 1

## 2018-12-27 MED ORDER — LACTATED RINGERS IV SOLN: INTRAVENOUS | Status: DC

## 2018-12-27 MED ORDER — STERILE WATER FOR INJECTION IJ SOLN
INTRAMUSCULAR | Status: AC
  Filled 2018-12-27: qty 10

## 2018-12-27 MED ORDER — CEFAZOLIN SODIUM-DEXTROSE 2-4 GM/100ML-% IV SOLN
2.0000 g | INTRAVENOUS | Status: DC
  Filled 2018-12-27: qty 100

## 2018-12-27 MED ORDER — SCOPOLAMINE 1 MG/3DAYS TD PT72
1.0000 | MEDICATED_PATCH | TRANSDERMAL | Status: DC
  Administered 2018-12-27: 1.5 mg via TRANSDERMAL
  Filled 2018-12-27: qty 1

## 2018-12-27 MED ORDER — PROPOFOL 10 MG/ML IV BOLUS
INTRAVENOUS | Status: AC
  Filled 2018-12-27: qty 20

## 2018-12-27 MED ORDER — GABAPENTIN 300 MG PO CAPS
300.0000 mg | ORAL_CAPSULE | ORAL | Status: AC
  Administered 2018-12-27: 300 mg via ORAL
  Filled 2018-12-27: qty 1

## 2018-12-27 MED ORDER — MIDAZOLAM HCL 2 MG/2ML IJ SOLN
INTRAMUSCULAR | Status: AC
  Filled 2018-12-27: qty 2

## 2018-12-27 MED ORDER — LIDOCAINE 2% (20 MG/ML) 5 ML SYRINGE
INTRAMUSCULAR | Status: AC
  Filled 2018-12-27: qty 5

## 2018-12-27 MED ORDER — ACETAMINOPHEN 500 MG PO TABS
1000.0000 mg | ORAL_TABLET | ORAL | Status: AC
  Administered 2018-12-27: 1000 mg via ORAL
  Filled 2018-12-27: qty 2

## 2018-12-27 MED ORDER — LIDOCAINE HCL 2 % IJ SOLN
INTRAMUSCULAR | Status: AC
  Filled 2018-12-27: qty 20

## 2018-12-27 MED ORDER — FENTANYL CITRATE (PF) 250 MCG/5ML IJ SOLN
INTRAMUSCULAR | Status: AC
  Filled 2018-12-27: qty 5

## 2018-12-27 NOTE — Treatment Plan (Signed)
Morning labs resulted showing INR of 2.2.  Patient states she has been off warfarin since Saturday.  Given increased risk of significant bleeding, discussed with the patient my concerns and recommendation to move her surgery to next Tuesday.  Patient amenable.  Will speak with her daughter.  Tentative plan to have her come in on Monday for labs so that if we need to push back surgery again, this can happen before the day of surgery.  Jeral Pinch MD Gynecologic Oncology

## 2018-12-27 NOTE — Telephone Encounter (Signed)
Informed by Dr. Berline Lopes that patients procedure was canceled today due to INR of 2.2. Daughter and patient state that patient has not taken any coumadin since staurday. Procedure has been rescheduled for Tuesday 12/22. Patient is to continue to hold warfarin and continue taking lovenox BID.   Calling to move patients follow up appointment to one week after procedure and make sure she understands instructions for lovenox.  Will also probably need a refill on her Lovenox- will need to see how many she has left

## 2018-12-27 NOTE — Interval H&P Note (Signed)
History and Physical Interval Note:  12/27/2018 7:03 AM  [name]  has presented today for surgery, with the diagnosis of UTERINE CANCER.  The various methods of treatment have been discussed with the patient and family. After consideration of risks, benefits and other options for treatment, the patient has consented to  Procedure(s): XI ROBOTIC ASSISTED TOTAL HYSTERECTOMY WITH BILATERAL SALPINGO OOPHORECTOMY (Bilateral) SENTINEL NODE BIOPSY (N/A) as a surgical intervention.  The patient's history has been reviewed, patient examined, no change in status, stable for surgery.  I have reviewed the patient's chart and labs.  Questions were answered to the patient's satisfaction.     [name]

## 2018-12-28 ENCOUNTER — Other Ambulatory Visit: Payer: Self-pay | Admitting: Gynecologic Oncology

## 2018-12-28 DIAGNOSIS — C541 Malignant neoplasm of endometrium: Secondary | ICD-10-CM

## 2018-12-28 LAB — TYPE AND SCREEN
ABO/RH(D): O POS
Antibody Screen: POSITIVE
Donor AG Type: NEGATIVE
Donor AG Type: NEGATIVE
Unit division: 0
Unit division: 0

## 2018-12-28 LAB — BPAM RBC
Blood Product Expiration Date: 202101132359
Blood Product Expiration Date: 202101142359
Unit Type and Rh: 5100
Unit Type and Rh: 5100

## 2018-12-28 MED ORDER — ENOXAPARIN SODIUM 100 MG/ML ~~LOC~~ SOLN: 100.0000 mg | Freq: Two times a day (BID) | SUBCUTANEOUS | 0 refills | Status: DC

## 2018-12-28 NOTE — Telephone Encounter (Signed)
Spoke with pt, moved her next INR check to 12/28 since procedure is moved to 12/22. She was advised to continue Lovenox with last dose 12/21 in the AM. Another box of 10 syringes sent to her pharmacy if she needs them. Will restart Lovenox 12/23 in the AM unless directed otherwise by MD. Staying off of warfarin until her procedure, advised her to resume her normal dose 12/22 AFTER her procedure that night. She verbalized understanding.

## 2018-12-31 ENCOUNTER — Inpatient Hospital Stay: Payer: Medicare HMO | Attending: Gynecologic Oncology

## 2018-12-31 ENCOUNTER — Telehealth: Payer: Self-pay | Admitting: Gynecologic Oncology

## 2018-12-31 ENCOUNTER — Encounter (HOSPITAL_COMMUNITY): Payer: Self-pay | Admitting: Gynecologic Oncology

## 2018-12-31 ENCOUNTER — Other Ambulatory Visit: Payer: Self-pay | Admitting: Gynecologic Oncology

## 2018-12-31 ENCOUNTER — Other Ambulatory Visit: Payer: Self-pay

## 2018-12-31 ENCOUNTER — Ambulatory Visit (HOSPITAL_COMMUNITY): Payer: Medicare HMO | Admitting: Certified Registered Nurse Anesthetist

## 2018-12-31 DIAGNOSIS — C541 Malignant neoplasm of endometrium: Secondary | ICD-10-CM | POA: Diagnosis not present

## 2018-12-31 DIAGNOSIS — Z952 Presence of prosthetic heart valve: Secondary | ICD-10-CM | POA: Diagnosis not present

## 2018-12-31 DIAGNOSIS — I4891 Unspecified atrial fibrillation: Secondary | ICD-10-CM | POA: Diagnosis not present

## 2018-12-31 DIAGNOSIS — Z5181 Encounter for therapeutic drug level monitoring: Secondary | ICD-10-CM

## 2018-12-31 LAB — APTT: aPTT: 54 seconds — ABNORMAL HIGH (ref 24–36)

## 2018-12-31 LAB — PROTIME-INR
INR: 1.2 (ref 0.8–1.2)
Prothrombin Time: 15 seconds (ref 11.4–15.2)

## 2018-12-31 NOTE — Progress Notes (Addendum)
Spoke with patient by phone, clear liquids from midnight until 430 am then npo, take carvedilol sip of water in am. Bring cpap mask and tubing. Arrive 530 am 01-01-19 [facility] short  stay

## 2018-12-31 NOTE — Telephone Encounter (Signed)
Spoke with the patient's daughter about her INR.  INR is 1.2.  We will proceed with surgery as scheduled tomorrow.  Jeral Pinch MD Gynecologic Oncology

## 2019-01-01 ENCOUNTER — Ambulatory Visit (HOSPITAL_COMMUNITY)
Admission: RE | Admit: 2019-01-01 | Discharge: 2019-01-01 | Disposition: A | Discharge status: Home / Self Care | Payer: Medicare HMO | Attending: Gynecologic Oncology | Admitting: Gynecologic Oncology

## 2019-01-01 ENCOUNTER — Telehealth: Payer: Self-pay

## 2019-01-01 DIAGNOSIS — Z5309 Procedure and treatment not carried out because of other contraindication: Secondary | ICD-10-CM | POA: Insufficient documentation

## 2019-01-01 DIAGNOSIS — C541 Malignant neoplasm of endometrium: Secondary | ICD-10-CM

## 2019-01-01 DIAGNOSIS — Z8249 Family history of ischemic heart disease and other diseases of the circulatory system: Secondary | ICD-10-CM | POA: Diagnosis not present

## 2019-01-01 DIAGNOSIS — I4819 Other persistent atrial fibrillation: Secondary | ICD-10-CM | POA: Insufficient documentation

## 2019-01-01 DIAGNOSIS — C55 Malignant neoplasm of uterus, part unspecified: Secondary | ICD-10-CM | POA: Diagnosis present

## 2019-01-01 DIAGNOSIS — Z794 Long term (current) use of insulin: Secondary | ICD-10-CM | POA: Insufficient documentation

## 2019-01-01 DIAGNOSIS — Z952 Presence of prosthetic heart valve: Secondary | ICD-10-CM | POA: Insufficient documentation

## 2019-01-01 DIAGNOSIS — Z20828 Contact with and (suspected) exposure to other viral communicable diseases: Secondary | ICD-10-CM | POA: Insufficient documentation

## 2019-01-01 DIAGNOSIS — I340 Nonrheumatic mitral (valve) insufficiency: Secondary | ICD-10-CM | POA: Diagnosis not present

## 2019-01-01 DIAGNOSIS — I5032 Chronic diastolic (congestive) heart failure: Secondary | ICD-10-CM | POA: Insufficient documentation

## 2019-01-01 DIAGNOSIS — I110 Hypertensive heart disease with heart failure: Secondary | ICD-10-CM | POA: Insufficient documentation

## 2019-01-01 DIAGNOSIS — E7800 Pure hypercholesterolemia, unspecified: Secondary | ICD-10-CM | POA: Insufficient documentation

## 2019-01-01 DIAGNOSIS — E119 Type 2 diabetes mellitus without complications: Secondary | ICD-10-CM | POA: Insufficient documentation

## 2019-01-01 DIAGNOSIS — Z825 Family history of asthma and other chronic lower respiratory diseases: Secondary | ICD-10-CM | POA: Diagnosis not present

## 2019-01-01 DIAGNOSIS — Z833 Family history of diabetes mellitus: Secondary | ICD-10-CM | POA: Diagnosis not present

## 2019-01-01 DIAGNOSIS — G4730 Sleep apnea, unspecified: Secondary | ICD-10-CM | POA: Insufficient documentation

## 2019-01-01 DIAGNOSIS — Z79899 Other long term (current) drug therapy: Secondary | ICD-10-CM | POA: Diagnosis not present

## 2019-01-01 LAB — GLUCOSE, CAPILLARY: Glucose-Capillary: 90 mg/dL (ref 70–99)

## 2019-01-01 LAB — PROTIME-INR
INR: 1.2 (ref 0.8–1.2)
Prothrombin Time: 15.2 seconds (ref 11.4–15.2)

## 2019-01-01 LAB — RESPIRATORY PANEL BY RT PCR (FLU A&B, COVID)
Influenza A by PCR: NEGATIVE
Influenza B by PCR: NEGATIVE
SARS Coronavirus 2 by RT PCR: NEGATIVE

## 2019-01-01 LAB — APTT: aPTT: 50 seconds — ABNORMAL HIGH (ref 24–36)

## 2019-01-01 MED ORDER — PROPOFOL 10 MG/ML IV BOLUS
INTRAVENOUS | Status: AC
  Filled 2019-01-01: qty 20

## 2019-01-01 MED ORDER — CELECOXIB 200 MG PO CAPS
200.0000 mg | ORAL_CAPSULE | ORAL | Status: AC
  Administered 2019-01-01: 200 mg via ORAL
  Filled 2019-01-01: qty 1

## 2019-01-01 MED ORDER — CEFAZOLIN SODIUM-DEXTROSE 2-4 GM/100ML-% IV SOLN
2.0000 g | INTRAVENOUS | Status: DC
  Filled 2019-01-01: qty 100

## 2019-01-01 MED ORDER — HEPARIN SODIUM (PORCINE) 5000 UNIT/ML IJ SOLN
5000.0000 [IU] | INTRAMUSCULAR | Status: DC
  Filled 2019-01-01: qty 1

## 2019-01-01 MED ORDER — FENTANYL CITRATE (PF) 250 MCG/5ML IJ SOLN
INTRAMUSCULAR | Status: AC
  Filled 2019-01-01: qty 5

## 2019-01-01 MED ORDER — GABAPENTIN 300 MG PO CAPS
300.0000 mg | ORAL_CAPSULE | ORAL | Status: AC
  Administered 2019-01-01: 06:00:00 300 mg via ORAL
  Filled 2019-01-01: qty 1

## 2019-01-01 MED ORDER — DEXAMETHASONE SODIUM PHOSPHATE 4 MG/ML IJ SOLN: 4.0000 mg | INTRAMUSCULAR | Status: DC

## 2019-01-01 MED ORDER — MIDAZOLAM HCL 2 MG/2ML IJ SOLN
INTRAMUSCULAR | Status: AC
  Filled 2019-01-01: qty 2

## 2019-01-01 MED ORDER — LACTATED RINGERS IV SOLN: INTRAVENOUS | Status: DC

## 2019-01-01 MED ORDER — ACETAMINOPHEN 500 MG PO TABS
1000.0000 mg | ORAL_TABLET | ORAL | Status: AC
  Administered 2019-01-01: 06:00:00 1000 mg via ORAL
  Filled 2019-01-01: qty 2

## 2019-01-01 NOTE — Anesthesia Preprocedure Evaluation (Deleted)
Anesthesia Evaluation  Patient identified by MRN, date of birth, ID band Patient awake    Reviewed: Allergy & Precautions, NPO status , Patient's Chart, lab work & pertinent test results  Airway Mallampati: II  TM Distance: >3 FB Neck ROM: Full    Dental no notable dental hx.    Pulmonary asthma , sleep apnea ,    Pulmonary exam normal breath sounds clear to auscultation       Cardiovascular hypertension, Pt. on medications +CHF  Normal cardiovascular exam+ dysrhythmias Atrial Fibrillation + Valvular Problems/Murmurs  Rhythm:Regular Rate:Normal     Neuro/Psych CVA negative psych ROS   GI/Hepatic negative GI ROS, Neg liver ROS,   Endo/Other  negative endocrine ROSdiabetes, Type 2, Insulin Dependent, Oral Hypoglycemic Agents  Renal/GU negative Renal ROS  negative genitourinary   Musculoskeletal negative musculoskeletal ROS (+)   Abdominal (+) + obese,   Peds negative pediatric ROS (+)  Hematology negative hematology ROS (+)   Anesthesia Other Findings   Reproductive/Obstetrics negative OB ROS                             Anesthesia Physical Anesthesia Plan  ASA: III  Anesthesia Plan: General   Post-op Pain Management:    Induction: Intravenous  PONV Risk Score and Plan: 3 and Ondansetron, Dexamethasone, Midazolam and Treatment may vary due to age or medical condition  Airway Management Planned: Oral ETT  Additional Equipment: Arterial line  Intra-op Plan:   Post-operative Plan: Extubation in OR  Informed Consent: I have reviewed the patients History and Physical, chart, labs and discussed the procedure including the risks, benefits and alternatives for the proposed anesthesia with the patient or authorized representative who has indicated his/her understanding and acceptance.     Dental advisory given  Plan Discussed with: CRNA  Anesthesia Plan Comments:          Anesthesia Quick Evaluation

## 2019-01-01 NOTE — Telephone Encounter (Signed)
Spoke with patient and reviewed not taking the lovenox not taking the Lovenox injection or coumadin until after surgery. Pt said she understands. Attempted to reach daughter to review and got vm. Dr. Berline Lopes LM earlier in daughter's vm regarding the plan for tomorrow.

## 2019-01-01 NOTE — Progress Notes (Signed)
Upon getting patient ready, pt stated she took her Lovenox injection this morning. This is previously why she got cancelled. Dr. Berline Lopes was called and states she's coming in to see the patient but she believes she will be cancelled. OR desk made aware. CRNA made aware

## 2019-01-01 NOTE — Treatment Plan (Signed)
Patient took therapeutic lovenox last night and this morning. Discussed with her risk of bleeding with surgery today. Plan to move surgery to tomorrow, 12/23. Called and left a message with the patient's daughter to let her know plan. Reviewed multiple times with the patient the importance of holding lovenox and Coumadin until surgery.   Jeral Pinch MD Gynecologic Oncology

## 2019-01-02 ENCOUNTER — Other Ambulatory Visit: Payer: Self-pay

## 2019-01-02 ENCOUNTER — Ambulatory Visit: Payer: Medicare HMO | Admitting: Gynecologic Oncology

## 2019-01-02 ENCOUNTER — Encounter (HOSPITAL_COMMUNITY)
Admission: RE | Disposition: A | Discharge status: Home / Self Care | Payer: Self-pay | Source: Home / Self Care | Attending: Gynecologic Oncology

## 2019-01-02 ENCOUNTER — Encounter (HOSPITAL_COMMUNITY): Payer: Self-pay | Admitting: Gynecologic Oncology

## 2019-01-02 ENCOUNTER — Ambulatory Visit (HOSPITAL_COMMUNITY): Payer: Medicare HMO | Admitting: Certified Registered Nurse Anesthetist

## 2019-01-02 ENCOUNTER — Observation Stay (HOSPITAL_COMMUNITY)
Admission: RE | Admit: 2019-01-02 | Discharge: 2019-01-05 | Disposition: A | Discharge status: Home / Self Care | Payer: Medicare HMO | Attending: Gynecologic Oncology | Admitting: Gynecologic Oncology

## 2019-01-02 DIAGNOSIS — Z7901 Long term (current) use of anticoagulants: Secondary | ICD-10-CM | POA: Insufficient documentation

## 2019-01-02 DIAGNOSIS — N83202 Unspecified ovarian cyst, left side: Secondary | ICD-10-CM | POA: Diagnosis not present

## 2019-01-02 DIAGNOSIS — E7800 Pure hypercholesterolemia, unspecified: Secondary | ICD-10-CM | POA: Diagnosis not present

## 2019-01-02 DIAGNOSIS — S3720XA Unspecified injury of bladder, initial encounter: Secondary | ICD-10-CM

## 2019-01-02 DIAGNOSIS — I5032 Chronic diastolic (congestive) heart failure: Secondary | ICD-10-CM | POA: Diagnosis not present

## 2019-01-02 DIAGNOSIS — N179 Acute kidney failure, unspecified: Secondary | ICD-10-CM | POA: Diagnosis not present

## 2019-01-02 DIAGNOSIS — N83201 Unspecified ovarian cyst, right side: Secondary | ICD-10-CM | POA: Diagnosis not present

## 2019-01-02 DIAGNOSIS — G4730 Sleep apnea, unspecified: Secondary | ICD-10-CM | POA: Diagnosis not present

## 2019-01-02 DIAGNOSIS — Z79899 Other long term (current) drug therapy: Secondary | ICD-10-CM | POA: Insufficient documentation

## 2019-01-02 DIAGNOSIS — N736 Female pelvic peritoneal adhesions (postinfective): Secondary | ICD-10-CM | POA: Diagnosis not present

## 2019-01-02 DIAGNOSIS — Z794 Long term (current) use of insulin: Secondary | ICD-10-CM | POA: Insufficient documentation

## 2019-01-02 DIAGNOSIS — I959 Hypotension, unspecified: Secondary | ICD-10-CM | POA: Insufficient documentation

## 2019-01-02 DIAGNOSIS — I4819 Other persistent atrial fibrillation: Secondary | ICD-10-CM | POA: Diagnosis not present

## 2019-01-02 DIAGNOSIS — C55 Malignant neoplasm of uterus, part unspecified: Secondary | ICD-10-CM | POA: Diagnosis not present

## 2019-01-02 DIAGNOSIS — I110 Hypertensive heart disease with heart failure: Secondary | ICD-10-CM | POA: Insufficient documentation

## 2019-01-02 DIAGNOSIS — C541 Malignant neoplasm of endometrium: Principal | ICD-10-CM | POA: Diagnosis present

## 2019-01-02 DIAGNOSIS — I4891 Unspecified atrial fibrillation: Secondary | ICD-10-CM | POA: Diagnosis not present

## 2019-01-02 DIAGNOSIS — E1165 Type 2 diabetes mellitus with hyperglycemia: Secondary | ICD-10-CM | POA: Insufficient documentation

## 2019-01-02 DIAGNOSIS — Z6836 Body mass index (BMI) 36.0-36.9, adult: Secondary | ICD-10-CM | POA: Insufficient documentation

## 2019-01-02 DIAGNOSIS — N9489 Other specified conditions associated with female genital organs and menstrual cycle: Secondary | ICD-10-CM | POA: Insufficient documentation

## 2019-01-02 DIAGNOSIS — D252 Subserosal leiomyoma of uterus: Secondary | ICD-10-CM | POA: Diagnosis not present

## 2019-01-02 DIAGNOSIS — D649 Anemia, unspecified: Secondary | ICD-10-CM | POA: Insufficient documentation

## 2019-01-02 DIAGNOSIS — E669 Obesity, unspecified: Secondary | ICD-10-CM | POA: Diagnosis not present

## 2019-01-02 DIAGNOSIS — R253 Fasciculation: Secondary | ICD-10-CM | POA: Insufficient documentation

## 2019-01-02 DIAGNOSIS — D259 Leiomyoma of uterus, unspecified: Secondary | ICD-10-CM | POA: Insufficient documentation

## 2019-01-02 DIAGNOSIS — E8940 Asymptomatic postprocedural ovarian failure: Secondary | ICD-10-CM

## 2019-01-02 DIAGNOSIS — R251 Tremor, unspecified: Secondary | ICD-10-CM | POA: Insufficient documentation

## 2019-01-02 DIAGNOSIS — Z952 Presence of prosthetic heart valve: Secondary | ICD-10-CM | POA: Insufficient documentation

## 2019-01-02 DIAGNOSIS — Z9889 Other specified postprocedural states: Secondary | ICD-10-CM

## 2019-01-02 DIAGNOSIS — Z90710 Acquired absence of both cervix and uterus: Secondary | ICD-10-CM

## 2019-01-02 DIAGNOSIS — E875 Hyperkalemia: Secondary | ICD-10-CM | POA: Diagnosis not present

## 2019-01-02 DIAGNOSIS — I447 Left bundle-branch block, unspecified: Secondary | ICD-10-CM | POA: Diagnosis not present

## 2019-01-02 HISTORY — PX: ROBOTIC ASSISTED LAPAROSCOPIC HYSTERECTOMY AND SALPINGECTOMY: SHX6379

## 2019-01-02 LAB — GLUCOSE, CAPILLARY
Glucose-Capillary: 72 mg/dL (ref 70–99)
Glucose-Capillary: 109 mg/dL — ABNORMAL HIGH (ref 70–99)
Glucose-Capillary: 116 mg/dL — ABNORMAL HIGH (ref 70–99)
Glucose-Capillary: 122 mg/dL — ABNORMAL HIGH (ref 70–99)
Glucose-Capillary: 128 mg/dL — ABNORMAL HIGH (ref 70–99)
Glucose-Capillary: 393 mg/dL — ABNORMAL HIGH (ref 70–99)

## 2019-01-02 LAB — PROTIME-INR
INR: 1.2 (ref 0.8–1.2)
Prothrombin Time: 14.8 seconds (ref 11.4–15.2)

## 2019-01-02 LAB — APTT: aPTT: 39 seconds — ABNORMAL HIGH (ref 24–36)

## 2019-01-02 SURGERY — HYSTERECTOMY, TOTAL, ROBOT-ASSISTED, LAPAROSCOPIC, WITH BILATERAL SALPINGO-OOPHORECTOMY
Anesthesia: General

## 2019-01-02 SURGERY — XI ROBOTIC ASSISTED LAPAROSCOPIC HYSTERECTOMY AND SALPINGECTOMY
Anesthesia: General | Laterality: Bilateral

## 2019-01-02 MED ORDER — OXYCODONE HCL 5 MG PO TABS: 5.0000 mg | ORAL_TABLET | Freq: Once | ORAL | Status: DC | PRN

## 2019-01-02 MED ORDER — ENOXAPARIN SODIUM 100 MG/ML ~~LOC~~ SOLN: 100.0000 mg | Freq: Two times a day (BID) | SUBCUTANEOUS | Status: DC

## 2019-01-02 MED ORDER — PROPOFOL 10 MG/ML IV BOLUS
INTRAVENOUS | Status: AC
  Filled 2019-01-02: qty 20

## 2019-01-02 MED ORDER — FENTANYL CITRATE (PF) 100 MCG/2ML IJ SOLN
INTRAMUSCULAR | Status: DC | PRN
  Administered 2019-01-02: 100 ug via INTRAVENOUS
  Administered 2019-01-02: 25 ug via INTRAVENOUS
  Administered 2019-01-02: 50 ug via INTRAVENOUS

## 2019-01-02 MED ORDER — FENTANYL CITRATE (PF) 100 MCG/2ML IJ SOLN
25.0000 ug | INTRAMUSCULAR | Status: DC | PRN
  Administered 2019-01-02: 25 ug via INTRAVENOUS

## 2019-01-02 MED ORDER — AMIODARONE HCL 200 MG PO TABS
200.0000 mg | ORAL_TABLET | Freq: Every day | ORAL | Status: DC
  Administered 2019-01-03 – 2019-01-05 (×3): 200 mg via ORAL
  Filled 2019-01-02 (×3): qty 1

## 2019-01-02 MED ORDER — STERILE WATER FOR INJECTION IJ SOLN
INTRAMUSCULAR | Status: AC
  Filled 2019-01-02: qty 10

## 2019-01-02 MED ORDER — LIDOCAINE HCL 2 % IJ SOLN
INTRAMUSCULAR | Status: AC
  Filled 2019-01-02: qty 20

## 2019-01-02 MED ORDER — ACETAMINOPHEN 500 MG PO TABS
1000.0000 mg | ORAL_TABLET | Freq: Four times a day (QID) | ORAL | Status: DC
  Administered 2019-01-02 – 2019-01-05 (×12): 1000 mg via ORAL
  Filled 2019-01-02 (×11): qty 2

## 2019-01-02 MED ORDER — LACTATED RINGERS IV SOLN: INTRAVENOUS | Status: DC | PRN

## 2019-01-02 MED ORDER — SUGAMMADEX SODIUM 500 MG/5ML IV SOLN
INTRAVENOUS | Status: DC | PRN
  Administered 2019-01-02: 408.4 mg via INTRAVENOUS

## 2019-01-02 MED ORDER — ROCURONIUM BROMIDE 10 MG/ML (PF) SYRINGE
PREFILLED_SYRINGE | INTRAVENOUS | Status: AC
  Filled 2019-01-02: qty 10

## 2019-01-02 MED ORDER — KETOROLAC TROMETHAMINE 30 MG/ML IJ SOLN
INTRAMUSCULAR | Status: AC
  Filled 2019-01-02: qty 1

## 2019-01-02 MED ORDER — KETOROLAC TROMETHAMINE 30 MG/ML IJ SOLN
15.0000 mg | Freq: Once | INTRAMUSCULAR | Status: AC
  Administered 2019-01-02: 15 mg via INTRAVENOUS

## 2019-01-02 MED ORDER — MIDAZOLAM HCL 2 MG/2ML IJ SOLN
INTRAMUSCULAR | Status: AC
  Filled 2019-01-02: qty 2

## 2019-01-02 MED ORDER — HEPARIN SODIUM (PORCINE) 5000 UNIT/ML IJ SOLN
5000.0000 [IU] | INTRAMUSCULAR | Status: AC
  Administered 2019-01-02: 5000 [IU] via SUBCUTANEOUS
  Filled 2019-01-02: qty 1

## 2019-01-02 MED ORDER — BUPIVACAINE HCL 0.25 % IJ SOLN
INTRAMUSCULAR | Status: DC | PRN
  Administered 2019-01-02: 26 mL

## 2019-01-02 MED ORDER — CARVEDILOL 12.5 MG PO TABS
12.5000 mg | ORAL_TABLET | Freq: Two times a day (BID) | ORAL | Status: DC
  Administered 2019-01-02 – 2019-01-05 (×6): 12.5 mg via ORAL
  Filled 2019-01-02 (×7): qty 1

## 2019-01-02 MED ORDER — SENNOSIDES-DOCUSATE SODIUM 8.6-50 MG PO TABS
2.0000 | ORAL_TABLET | Freq: Every day | ORAL | Status: DC
  Administered 2019-01-02 – 2019-01-04 (×3): 2 via ORAL
  Filled 2019-01-02 (×3): qty 2

## 2019-01-02 MED ORDER — FENTANYL CITRATE (PF) 100 MCG/2ML IJ SOLN
INTRAMUSCULAR | Status: AC
  Filled 2019-01-02: qty 2

## 2019-01-02 MED ORDER — PROPOFOL 10 MG/ML IV BOLUS
INTRAVENOUS | Status: DC | PRN
  Administered 2019-01-02: 120 mg via INTRAVENOUS

## 2019-01-02 MED ORDER — ROCURONIUM BROMIDE 50 MG/5ML IV SOSY
PREFILLED_SYRINGE | INTRAVENOUS | Status: DC | PRN
  Administered 2019-01-02: 60 mg via INTRAVENOUS
  Administered 2019-01-02 (×2): 20 mg via INTRAVENOUS

## 2019-01-02 MED ORDER — ENOXAPARIN SODIUM 40 MG/0.4ML ~~LOC~~ SOLN: 40.0000 mg | SUBCUTANEOUS | Status: DC

## 2019-01-02 MED ORDER — LACTATED RINGERS IV SOLN: INTRAVENOUS | Status: DC

## 2019-01-02 MED ORDER — GABAPENTIN 300 MG PO CAPS
300.0000 mg | ORAL_CAPSULE | Freq: Three times a day (TID) | ORAL | Status: DC
  Administered 2019-01-02 (×2): 300 mg via ORAL
  Filled 2019-01-02 (×2): qty 1

## 2019-01-02 MED ORDER — LIDOCAINE 2% (20 MG/ML) 5 ML SYRINGE
INTRAMUSCULAR | Status: DC | PRN
  Administered 2019-01-02: 80 mg via INTRAVENOUS

## 2019-01-02 MED ORDER — FENTANYL CITRATE (PF) 250 MCG/5ML IJ SOLN
INTRAMUSCULAR | Status: AC
  Filled 2019-01-02: qty 5

## 2019-01-02 MED ORDER — DEXTROSE 50 % IV SOLN: 25.0000 mL | Freq: Once | INTRAVENOUS | Status: AC

## 2019-01-02 MED ORDER — INDOCYANINE GREEN 25 MG IV SOLR
INTRAVENOUS | Status: DC | PRN
  Administered 2019-01-02: 2.5 mg

## 2019-01-02 MED ORDER — MIDAZOLAM HCL 5 MG/5ML IJ SOLN
INTRAMUSCULAR | Status: DC | PRN
  Administered 2019-01-02: 2 mg via INTRAVENOUS

## 2019-01-02 MED ORDER — ONDANSETRON HCL 4 MG/2ML IJ SOLN: 4.0000 mg | Freq: Once | INTRAMUSCULAR | Status: DC | PRN

## 2019-01-02 MED ORDER — WARFARIN SODIUM 4 MG PO TABS
4.0000 mg | ORAL_TABLET | Freq: Every day | ORAL | Status: DC
  Filled 2019-01-02: qty 1

## 2019-01-02 MED ORDER — WARFARIN - PHYSICIAN DOSING INPATIENT: Freq: Every day | Status: DC

## 2019-01-02 MED ORDER — LIDOCAINE 2% (20 MG/ML) 5 ML SYRINGE
INTRAMUSCULAR | Status: DC | PRN
  Administered 2019-01-02: 1.5 mg/kg/h via INTRAVENOUS

## 2019-01-02 MED ORDER — HYDROMORPHONE HCL 1 MG/ML IJ SOLN: 0.5000 mg | INTRAMUSCULAR | Status: DC | PRN

## 2019-01-02 MED ORDER — DEXAMETHASONE SODIUM PHOSPHATE 4 MG/ML IJ SOLN
4.0000 mg | INTRAMUSCULAR | Status: AC
  Administered 2019-01-02: 4 mg via INTRAVENOUS

## 2019-01-02 MED ORDER — ONDANSETRON HCL 4 MG PO TABS: 4.0000 mg | ORAL_TABLET | Freq: Four times a day (QID) | ORAL | Status: DC | PRN

## 2019-01-02 MED ORDER — DICLOFENAC SODIUM 50 MG PO TBEC
50.0000 mg | DELAYED_RELEASE_TABLET | Freq: Four times a day (QID) | ORAL | Status: DC
  Administered 2019-01-02 (×2): 50 mg via ORAL
  Filled 2019-01-02 (×4): qty 1

## 2019-01-02 MED ORDER — KCL IN DEXTROSE-NACL 20-5-0.45 MEQ/L-%-% IV SOLN
INTRAVENOUS | Status: DC
  Filled 2019-01-02 (×2): qty 1000

## 2019-01-02 MED ORDER — CHLORHEXIDINE GLUCONATE CLOTH 2 % EX PADS: 6.0000 | MEDICATED_PAD | Freq: Once | CUTANEOUS | Status: DC

## 2019-01-02 MED ORDER — DEXAMETHASONE SODIUM PHOSPHATE 10 MG/ML IJ SOLN
INTRAMUSCULAR | Status: AC
  Filled 2019-01-02: qty 1

## 2019-01-02 MED ORDER — STERILE WATER FOR IRRIGATION IR SOLN
Status: DC | PRN
  Administered 2019-01-02: 1000 mL

## 2019-01-02 MED ORDER — KETAMINE HCL 10 MG/ML IJ SOLN
INTRAMUSCULAR | Status: DC | PRN
  Administered 2019-01-02: 30 mg via INTRAVENOUS

## 2019-01-02 MED ORDER — BUPIVACAINE HCL 0.25 % IJ SOLN
INTRAMUSCULAR | Status: AC
  Filled 2019-01-02: qty 1

## 2019-01-02 MED ORDER — INSULIN GLARGINE 100 UNIT/ML ~~LOC~~ SOLN
26.0000 [IU] | Freq: Every day | SUBCUTANEOUS | Status: DC
  Administered 2019-01-02: 26 [IU] via SUBCUTANEOUS
  Filled 2019-01-02 (×2): qty 0.26

## 2019-01-02 MED ORDER — CEFAZOLIN SODIUM-DEXTROSE 2-4 GM/100ML-% IV SOLN
2.0000 g | INTRAVENOUS | Status: AC
  Administered 2019-01-02: 2 g via INTRAVENOUS
  Filled 2019-01-02: qty 100

## 2019-01-02 MED ORDER — ONDANSETRON HCL 4 MG/2ML IJ SOLN: 4.0000 mg | Freq: Four times a day (QID) | INTRAMUSCULAR | Status: DC | PRN

## 2019-01-02 MED ORDER — INSULIN GLARGINE 100 UNIT/ML ~~LOC~~ SOLN
22.0000 [IU] | Freq: Every day | SUBCUTANEOUS | Status: DC
  Administered 2019-01-03: 22 [IU] via SUBCUTANEOUS
  Filled 2019-01-02: qty 0.22

## 2019-01-02 MED ORDER — DIPHENHYDRAMINE HCL 50 MG/ML IJ SOLN
INTRAMUSCULAR | Status: AC
  Filled 2019-01-02: qty 1

## 2019-01-02 MED ORDER — LIDOCAINE 2% (20 MG/ML) 5 ML SYRINGE
INTRAMUSCULAR | Status: AC
  Filled 2019-01-02: qty 5

## 2019-01-02 MED ORDER — ONDANSETRON HCL 4 MG/2ML IJ SOLN
INTRAMUSCULAR | Status: AC
  Filled 2019-01-02: qty 2

## 2019-01-02 MED ORDER — KETAMINE HCL 10 MG/ML IJ SOLN
INTRAMUSCULAR | Status: AC
  Filled 2019-01-02: qty 1

## 2019-01-02 MED ORDER — SUGAMMADEX SODIUM 500 MG/5ML IV SOLN
INTRAVENOUS | Status: AC
  Filled 2019-01-02: qty 5

## 2019-01-02 MED ORDER — ACETAMINOPHEN 500 MG PO TABS
1000.0000 mg | ORAL_TABLET | ORAL | Status: AC
  Administered 2019-01-02: 1000 mg via ORAL
  Filled 2019-01-02: qty 2

## 2019-01-02 MED ORDER — AMLODIPINE BESYLATE 5 MG PO TABS: 5.0000 mg | ORAL_TABLET | Freq: Every day | ORAL | Status: DC

## 2019-01-02 MED ORDER — ATORVASTATIN CALCIUM 40 MG PO TABS
40.0000 mg | ORAL_TABLET | Freq: Every day | ORAL | Status: DC
  Administered 2019-01-03 – 2019-01-05 (×3): 40 mg via ORAL
  Filled 2019-01-02 (×3): qty 1

## 2019-01-02 MED ORDER — CELECOXIB 200 MG PO CAPS
200.0000 mg | ORAL_CAPSULE | ORAL | Status: AC
  Administered 2019-01-02: 07:00:00 200 mg via ORAL
  Filled 2019-01-02: qty 1

## 2019-01-02 MED ORDER — STERILE WATER FOR INJECTION IJ SOLN
INTRAMUSCULAR | Status: DC | PRN
  Administered 2019-01-02: 9 mL

## 2019-01-02 MED ORDER — GABAPENTIN 300 MG PO CAPS
300.0000 mg | ORAL_CAPSULE | ORAL | Status: AC
  Administered 2019-01-02: 07:00:00 300 mg via ORAL
  Filled 2019-01-02: qty 1

## 2019-01-02 MED ORDER — INSULIN ASPART 100 UNIT/ML ~~LOC~~ SOLN
0.0000 [IU] | Freq: Every day | SUBCUTANEOUS | Status: DC
  Administered 2019-01-02: 5 [IU] via SUBCUTANEOUS
  Administered 2019-01-03 – 2019-01-04 (×2): 3 [IU] via SUBCUTANEOUS

## 2019-01-02 MED ORDER — INSULIN ASPART 100 UNIT/ML ~~LOC~~ SOLN
0.0000 [IU] | Freq: Three times a day (TID) | SUBCUTANEOUS | Status: DC
  Administered 2019-01-02: 8 [IU] via SUBCUTANEOUS
  Administered 2019-01-03 (×2): 5 [IU] via SUBCUTANEOUS
  Administered 2019-01-03 – 2019-01-05 (×5): 3 [IU] via SUBCUTANEOUS

## 2019-01-02 MED ORDER — OXYCODONE HCL 5 MG/5ML PO SOLN: 5.0000 mg | Freq: Once | ORAL | Status: DC | PRN

## 2019-01-02 MED ORDER — EPHEDRINE SULFATE 50 MG/ML IJ SOLN
INTRAMUSCULAR | Status: DC | PRN
  Administered 2019-01-02: 5 mg via INTRAVENOUS
  Administered 2019-01-02 (×2): 10 mg via INTRAVENOUS
  Administered 2019-01-02: 5 mg via INTRAVENOUS
  Administered 2019-01-02 (×2): 15 mg via INTRAVENOUS

## 2019-01-02 MED ORDER — OXYCODONE HCL 5 MG PO TABS
5.0000 mg | ORAL_TABLET | ORAL | Status: DC | PRN
  Administered 2019-01-02: 5 mg via ORAL
  Filled 2019-01-02 (×2): qty 1

## 2019-01-02 MED ORDER — DEXTROSE 50 % IV SOLN
INTRAVENOUS | Status: AC
  Administered 2019-01-02: 25 mL via INTRAVENOUS
  Filled 2019-01-02: qty 50

## 2019-01-02 MED ORDER — ONDANSETRON HCL 4 MG/2ML IJ SOLN
INTRAMUSCULAR | Status: DC | PRN
  Administered 2019-01-02: 4 mg via INTRAVENOUS

## 2019-01-02 MED ORDER — LACTATED RINGERS IR SOLN
Status: DC | PRN
  Administered 2019-01-02: 1000 mL

## 2019-01-02 SURGICAL SUPPLY — 73 items
ADH SKN CLS APL DERMABOND .7 (GAUZE/BANDAGES/DRESSINGS) ×1
AGENT HMST KT MTR STRL THRMB (HEMOSTASIS)
APL ESCP 34 STRL LF DISP (HEMOSTASIS)
APPLICATOR SURGIFLO ENDO (HEMOSTASIS) IMPLANT
BACTOSHIELD CHG 4% 4OZ (MISCELLANEOUS) ×2
BAG LAPAROSCOPIC 12 15 PORT 16 (BASKET) IMPLANT
BAG RETRIEVAL 12/15 (BASKET)
BAG RETRIEVAL 12/15MM (BASKET)
BAG SPEC RTRVL LRG 6X4 10 (ENDOMECHANICALS)
BLADE SURG SZ10 CARB STEEL (BLADE) IMPLANT
COVER BACK TABLE 60X90IN (DRAPES) ×3 IMPLANT
COVER TIP SHEARS 8 DVNC (MISCELLANEOUS) ×1 IMPLANT
COVER TIP SHEARS 8MM DA VINCI (MISCELLANEOUS) ×2
COVER WAND RF STERILE (DRAPES) IMPLANT
DECANTER SPIKE VIAL GLASS SM (MISCELLANEOUS) ×2 IMPLANT
DERMABOND ADVANCED (GAUZE/BANDAGES/DRESSINGS) ×2
DERMABOND ADVANCED .7 DNX12 (GAUZE/BANDAGES/DRESSINGS) ×1 IMPLANT
DRAPE ARM DVNC X/XI (DISPOSABLE) ×4 IMPLANT
DRAPE COLUMN DVNC XI (DISPOSABLE) ×1 IMPLANT
DRAPE DA VINCI XI ARM (DISPOSABLE) ×8
DRAPE DA VINCI XI COLUMN (DISPOSABLE) ×2
DRAPE SHEET LG 3/4 BI-LAMINATE (DRAPES) ×3 IMPLANT
DRAPE SURG IRRIG POUCH 19X23 (DRAPES) ×3 IMPLANT
DRSG OPSITE POSTOP 4X6 (GAUZE/BANDAGES/DRESSINGS) IMPLANT
DRSG OPSITE POSTOP 4X8 (GAUZE/BANDAGES/DRESSINGS) IMPLANT
ELECT REM PT RETURN 15FT ADLT (MISCELLANEOUS) ×3 IMPLANT
GLOVE BIO SURGEON STRL SZ 6 (GLOVE) ×12 IMPLANT
GLOVE BIO SURGEON STRL SZ 6.5 (GLOVE) ×2 IMPLANT
GLOVE BIO SURGEONS STRL SZ 6.5 (GLOVE) ×2
GOWN STRL REUS W/ TWL LRG LVL3 (GOWN DISPOSABLE) ×4 IMPLANT
GOWN STRL REUS W/TWL LRG LVL3 (GOWN DISPOSABLE) ×12
HOLDER FOLEY CATH W/STRAP (MISCELLANEOUS) ×3 IMPLANT
IRRIG SUCT STRYKERFLOW 2 WTIP (MISCELLANEOUS) ×3
IRRIGATION SUCT STRKRFLW 2 WTP (MISCELLANEOUS) ×1 IMPLANT
KIT PROCEDURE DA VINCI SI (MISCELLANEOUS) ×2
KIT PROCEDURE DVNC SI (MISCELLANEOUS) IMPLANT
KIT TURNOVER KIT A (KITS) IMPLANT
MANIPULATOR UTERINE 4.5 ZUMI (MISCELLANEOUS) ×3 IMPLANT
NDL SPNL 18GX3.5 QUINCKE PK (NEEDLE) IMPLANT
NEEDLE HYPO 22GX1.5 SAFETY (NEEDLE) IMPLANT
NEEDLE SPNL 18GX3.5 QUINCKE PK (NEEDLE) ×3 IMPLANT
OBTURATOR OPTICAL STANDARD 8MM (TROCAR) ×2
OBTURATOR OPTICAL STND 8 DVNC (TROCAR) ×1
OBTURATOR OPTICALSTD 8 DVNC (TROCAR) ×1 IMPLANT
PACK ROBOT GYN CUSTOM WL (TRAY / TRAY PROCEDURE) ×3 IMPLANT
PAD POSITIONING PINK XL (MISCELLANEOUS) ×3 IMPLANT
PENCIL SMOKE EVACUATOR (MISCELLANEOUS) IMPLANT
PORT ACCESS TROCAR AIRSEAL 12 (TROCAR) ×1 IMPLANT
PORT ACCESS TROCAR AIRSEAL 5M (TROCAR) ×2
POUCH SPECIMEN RETRIEVAL 10MM (ENDOMECHANICALS) IMPLANT
SCRUB CHG 4% DYNA-HEX 4OZ (MISCELLANEOUS) ×1 IMPLANT
SEAL CANN UNIV 5-8 DVNC XI (MISCELLANEOUS) ×3 IMPLANT
SEAL XI 5MM-8MM UNIVERSAL (MISCELLANEOUS) ×6
SET TRI-LUMEN FLTR TB AIRSEAL (TUBING) ×3 IMPLANT
SPONGE LAP 18X18 X RAY DECT (DISPOSABLE) IMPLANT
SURGIFLO W/THROMBIN 8M KIT (HEMOSTASIS) IMPLANT
SUT MNCRL AB 4-0 PS2 18 (SUTURE) IMPLANT
SUT PDS AB 1 TP1 96 (SUTURE) IMPLANT
SUT VIC AB 0 CT1 27 (SUTURE)
SUT VIC AB 0 CT1 27XBRD ANTBC (SUTURE) IMPLANT
SUT VIC AB 2-0 CT1 27 (SUTURE)
SUT VIC AB 2-0 CT1 TAPERPNT 27 (SUTURE) IMPLANT
SUT VIC AB 2-0 SH 27 (SUTURE) ×3
SUT VIC AB 2-0 SH 27X BRD (SUTURE) IMPLANT
SUT VICRYL 4-0 PS2 18IN ABS (SUTURE) ×6 IMPLANT
SYR 10ML LL (SYRINGE) ×2 IMPLANT
TOWEL OR NON WOVEN STRL DISP B (DISPOSABLE) ×3 IMPLANT
TRAP SPECIMEN MUCOUS 40CC (MISCELLANEOUS) IMPLANT
TRAY FOLEY MTR SLVR 16FR STAT (SET/KITS/TRAYS/PACK) ×3 IMPLANT
TROCAR XCEL NON-BLD 5MMX100MML (ENDOMECHANICALS) IMPLANT
UNDERPAD 30X36 HEAVY ABSORB (UNDERPADS AND DIAPERS) ×3 IMPLANT
WATER STERILE IRR 1000ML POUR (IV SOLUTION) ×3 IMPLANT
YANKAUER SUCT BULB TIP 10FT TU (MISCELLANEOUS) IMPLANT
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
## 2019-01-02 NOTE — Anesthesia Preprocedure Evaluation (Signed)
Anesthesia Evaluation  Patient identified by MRN, date of birth, ID band Patient awake    Reviewed: Allergy & Precautions, NPO status , Patient's Chart, lab work & pertinent test results  Airway Mallampati: II  TM Distance: >3 FB Neck ROM: Full    Dental  (+) Teeth Intact, Dental Advisory Given   Pulmonary    breath sounds clear to auscultation       Cardiovascular hypertension,  Rhythm:Regular Rate:Normal + Systolic Click    Neuro/Psych    GI/Hepatic   Endo/Other  diabetes  Renal/GU      Musculoskeletal   Abdominal (+) + obese,   Peds  Hematology   Anesthesia Other Findings   Reproductive/Obstetrics                             Anesthesia Physical Anesthesia Plan  ASA: III  Anesthesia Plan: General   Post-op Pain Management:  Regional for Post-op pain   Induction: Intravenous  PONV Risk Score and Plan:   Airway Management Planned: Oral ETT  Additional Equipment:   Intra-op Plan:   Post-operative Plan: Extubation in OR  Informed Consent: I have reviewed the patients History and Physical, chart, labs and discussed the procedure including the risks, benefits and alternatives for the proposed anesthesia with the patient or authorized representative who has indicated his/her understanding and acceptance.     Dental advisory given  Plan Discussed with: CRNA and Anesthesiologist  Anesthesia Plan Comments:         Anesthesia Quick Evaluation

## 2019-01-02 NOTE — Anesthesia Postprocedure Evaluation (Signed)
Anesthesia Post Note  Patient: [name]  Procedure(s) Performed: XI ROBOTIC ASSISTED LAPAROSCOPIC TOTAL HYSTERECTOMY WITH BILATERAL SALPINGOOPHORECTOMY, SENTINEL LYMPH NODE BIOPSY (Bilateral )     Patient location during evaluation: PACU Anesthesia Type: General Level of consciousness: awake and alert Pain management: pain level controlled Vital Signs Assessment: post-procedure vital signs reviewed and stable Respiratory status: spontaneous breathing, nonlabored ventilation, respiratory function stable and patient connected to nasal cannula oxygen Cardiovascular status: blood pressure returned to baseline and stable Postop Assessment: no apparent nausea or vomiting Anesthetic complications: no    Last Vitals:  Vitals:   01/02/19 1215 01/02/19 1246  BP: (!) 143/58 (!) 145/58  Pulse: (!) 56 (!) 58  Resp: 17 14  Temp:  36.5 C  SpO2: 99% 97%    Last Pain:  Vitals:   01/02/19 1200  TempSrc:   PainSc: Asleep                 Lorrin Nawrot COKER

## 2019-01-02 NOTE — Op Note (Signed)
OPERATIVE NOTE  Surgeon: Dr. Jeral Pinch  Assistants: Dr Lahoma Crocker (an MD assistant was necessary for tissue manipulation, management of robotic instrumentation, retraction and positioning due to the complexity of the case and hospital policies).   Anesthesia: General endotracheal anesthesia  Pre-operative Diagnosis: endometrial cancer, high grade  Post-operative Diagnosis: same  Operation: Robotic-assisted laparoscopic total hysterectomy with bilateral salpingoophorectomy, SLN biopsy   Urine Output: 50 cc  Operative Findings:  On EUA, 10cm retroverted uterus. NO nodularity. ON intra-abdominal entry. Normal upper abdominal survey. Normal omentum, some adhesions to the anterior abdominal wall just inferior to the umbilicus. Normal appearing small and large bowel. Uterus 10-12cm, bulbus. Several plaque like lesions on the anterior and posterior serosa c/w possible metastatic disease. Filmy adhesions from the mesentery of her rectum to the posterior LUS. Bilateral tubes with evidence of prior BTL. Atrophic ovaries, adherent to the lateral uterus and medial aspect of the broad ligament bilaterally. SLN on right, mildly enlarged and concerning for tumor involvement. Mapping to left external iliac vein LN, second pathway of channels to conglomeration of lymph nodes on the external iliac artery, removed as external iliac LNs. Small (<0.5cm) disruption to peritoneum over the bladder and possible disruption of muscular layer on the posterior bladder, oversewn. No other intra-abdominal or pelvic evidence of disease.  Estimated Blood Loss:  less than 50 mL      Total IV Fluids: 600 ml         Specimens: uterus, cervix, bilateral tubes and ovaries, bilateral SLNs (right obturator, left external iliac), left external iliac LNs         Complications:  None apparent; patient tolerated the procedure well.         Disposition: PACU - hemodynamically stable.  Procedure Details  The patient  was seen in the Holding Room. The risks, benefits, complications, treatment options, and expected outcomes were discussed with the patient.  The patient concurred with the proposed plan, giving informed consent.  The site of surgery properly noted/marked. The patient was identified as [name] and the procedure verified as a Robotic-assisted hysterectomy with bilateral salpingo oophorectomy with SLN biopsy. A Time Out was held and the above information confirmed.  After induction of anesthesia, the patient was draped and prepped in the usual sterile manner. Pt was placed in supine position after anesthesia and draped and prepped in the usual sterile manner. The abdominal drape was placed after the CholoraPrep had been allowed to dry for 3 minutes.  Her arms were tucked to her side with all appropriate precautions.  The shoulders were stabilized with padded shoulder blocks applied to the acromium processes.  The patient was placed in the semi-lithotomy position in [place].  The perineum was prepped with Betadine. The patient was then prepped. Foley catheter was placed.  A sterile speculum was placed in the vagina.  The cervix was grasped with a single-tooth tenaculum. 2mg  total of ICG was injected into the cervical stroma at 2 and 9 o'clock with 1cc injected at a 1cm and 29mm depth (concentration 0.5mg /ml) in all locations. The cervix was dilated with Kennon Rounds dilators.  The ZUMI uterine manipulator with a medium colpotomizer ring was placed without difficulty.  A pneum occluder balloon was placed over the manipulator.  OG tube placement was confirmed and to suction.   Next, a 10 mm skin incision was made 1 cm below the subcostal margin in the midclavicular line.  The 5 mm Optiview port and scope was used for direct entry.  Opening  pressure was under 10 mm CO2.  The abdomen was insufflated and the findings were noted as above.   At this point and all points during the procedure, the patient's intra-abdominal  pressure did not exceed 15 mmHg. Next, a 8 mm skin incision was made above the umbilicus and a right and left port was placed about 8 cm lateral to the robot port on the right and left side.  A fourth arm was placed in the right abdomen. The 5 mm assist port was exchanged for a 10-12 mm port. All ports were placed under direct visualization.  The patient was placed in steep Trendelenburg.  After sharp dissection and electrocautery were used to lyse the adhesions of the omentum to the anterior abdominal wall, the bowel was folded away into the upper abdomen.  The robot was docked in the normal manner.  The right and left peritoneum were opened parallel to the IP ligament to open the retroperitoneal spaces bilaterally. The SLN mapping was performed in bilateral pelvic basins. The para rectal and paravesical spaces were opened up entirely with careful dissection below the level of the ureters bilaterally and to the depth of the uterine artery origin in order to skeletonize the uterine "web" and ensure visualization of all parametrial channels. The para-aortic basins were carefully exposed and evaluated for isolated para-aortic SLN's. Lymphatic channels were identified travelling to the following visualized sentinel lymph node's: right obturator and left external iliac. These SLN's were separated from their surrounding lymphatic tissue, removed and sent for permanent pathology. Given second channel to a group of left external iliac sentinel lymph nodes, these were removed and sent as left external iliac LNs.  The hysterectomy was started after the round ligament on the right side was incised and the retroperitoneum was entered and the pararectal space was developed.  The ureter was noted to be on the medial leaf of the broad ligament.  The peritoneum above the ureter was incised and stretched and the infundibulopelvic ligament was skeletonized, cauterized and cut.  The posterior peritoneum was taken down to the  level of the KOH ring. During this dissection sharp dissection was used to lyse the filmy adhesions from the rectum to the posterior LUS.  The anterior peritoneum was also taken down.  The bladder flap was created to the level of the KOH ring.  The uterine artery on the right side was skeletonized, cauterized and cut in the normal manner.  A similar procedure was performed on the left.  The colpotomy was made and the uterus, cervix, bilateral ovaries and tubes were amputated and delivered through the vagina.  Pedicles were inspected and excellent hemostasis was achieved.    The colpotomy at the vaginal cuff was closed with Vicryl on a CT1 needle in a running manner.  Irrigation was used and excellent hemostasis was achieved.  A figure of eight stitch with 2-0 Vicryl was used to oversew the possible bladder muscularis disruption.  At this point in the procedure was completed.  Robotic instruments were removed under direct visulaization.  The robot was undocked. The 10 mm ports were closed with Vicryl on a UR-5 needle and the fascia was closed with 0 Vicryl on a UR-5 needle.  The skin was closed with 4-0 Vicryl in a subcuticular manner.  Dermabond was applied.  Sponge, lap and needle counts correct x 2.  The patient was taken to the recovery room in stable condition.  The vagina was swabbed with  minimal bleeding noted.   The  foley catheter was removed. All instrument and needle counts were correct x  3.   Jeral Pinch, MD

## 2019-01-02 NOTE — Anesthesia Procedure Notes (Signed)
Procedure Name: Intubation Date/Time: 01/02/2019 8:30 AM Performed by: Deliah Boston, CRNA Pre-anesthesia Checklist: Patient identified, Emergency Drugs available, Suction available and Patient being monitored Patient Re-evaluated:Patient Re-evaluated prior to induction Oxygen Delivery Method: Circle system utilized Preoxygenation: Pre-oxygenation with 100% oxygen Induction Type: IV induction Ventilation: Mask ventilation without difficulty Laryngoscope Size: Mac and 4 Grade View: Grade I Tube type: Oral Tube size: 7.5 mm Number of attempts: 1 Airway Equipment and Method: Stylet and Oral airway Placement Confirmation: ETT inserted through vocal cords under direct vision,  positive ETCO2 and breath sounds checked- equal and bilateral Secured at: 21 cm Tube secured with: Tape Dental Injury: Teeth and Oropharynx as per pre-operative assessment

## 2019-01-02 NOTE — Plan of Care (Signed)

## 2019-01-02 NOTE — Transfer of Care (Signed)
Immediate Anesthesia Transfer of Care Note  Patient: [name]  Procedure(s) Performed: Procedure(s): XI ROBOTIC ASSISTED LAPAROSCOPIC TOTAL HYSTERECTOMY WITH BILATERAL SALPINGOOPHORECTOMY, SENTINEL LYMPH NODE BIOPSY (Bilateral)  Patient Location: PACU  Anesthesia Type:General  Level of Consciousness: Patient easily awoken, sedated, comfortable, cooperative, following commands, responds to stimulation.   Airway & Oxygen Therapy: Patient spontaneously breathing, ventilating well, oxygen via simple oxygen mask.  Post-op Assessment: Report given to PACU RN, vital signs reviewed and stable, moving all extremities.   Post vital signs: Reviewed and stable.  Complications: No apparent anesthesia complications Last Vitals:  Vitals Value Taken Time  BP 119/65 01/02/19 1116  Temp    Pulse 52 01/02/19 1122  Resp 18 01/02/19 1122  SpO2 99 % 01/02/19 1122  Vitals shown include unvalidated device data.  Last Pain:  Vitals:   01/02/19 0651  TempSrc: Oral         Complications: No apparent anesthesia complications

## 2019-01-03 ENCOUNTER — Ambulatory Visit (HOSPITAL_COMMUNITY): Payer: Medicare HMO

## 2019-01-03 DIAGNOSIS — N83201 Unspecified ovarian cyst, right side: Secondary | ICD-10-CM | POA: Diagnosis not present

## 2019-01-03 DIAGNOSIS — N83202 Unspecified ovarian cyst, left side: Secondary | ICD-10-CM | POA: Diagnosis not present

## 2019-01-03 DIAGNOSIS — E1165 Type 2 diabetes mellitus with hyperglycemia: Secondary | ICD-10-CM | POA: Diagnosis not present

## 2019-01-03 DIAGNOSIS — C541 Malignant neoplasm of endometrium: Secondary | ICD-10-CM | POA: Diagnosis not present

## 2019-01-03 DIAGNOSIS — D259 Leiomyoma of uterus, unspecified: Secondary | ICD-10-CM | POA: Diagnosis not present

## 2019-01-03 DIAGNOSIS — I4819 Other persistent atrial fibrillation: Secondary | ICD-10-CM | POA: Diagnosis not present

## 2019-01-03 DIAGNOSIS — N179 Acute kidney failure, unspecified: Secondary | ICD-10-CM | POA: Diagnosis not present

## 2019-01-03 DIAGNOSIS — N9489 Other specified conditions associated with female genital organs and menstrual cycle: Secondary | ICD-10-CM | POA: Diagnosis not present

## 2019-01-03 DIAGNOSIS — I110 Hypertensive heart disease with heart failure: Secondary | ICD-10-CM | POA: Diagnosis not present

## 2019-01-03 DIAGNOSIS — N736 Female pelvic peritoneal adhesions (postinfective): Secondary | ICD-10-CM | POA: Diagnosis not present

## 2019-01-03 LAB — MAGNESIUM: Magnesium: 1.7 mg/dL (ref 1.7–2.4)

## 2019-01-03 LAB — CBC
HCT: 29.3 % — ABNORMAL LOW (ref 36.0–46.0)
HCT: 29.6 % — ABNORMAL LOW (ref 36.0–46.0)
HCT: 27.2 % — ABNORMAL LOW (ref 36.0–46.0)
HCT: 26.9 % — ABNORMAL LOW (ref 36.0–46.0)
Hemoglobin: 9 g/dL — ABNORMAL LOW (ref 12.0–15.0)
Hemoglobin: 8.9 g/dL — ABNORMAL LOW (ref 12.0–15.0)
Hemoglobin: 8.3 g/dL — ABNORMAL LOW (ref 12.0–15.0)
Hemoglobin: 8.2 g/dL — ABNORMAL LOW (ref 12.0–15.0)
MCH: 30.2 pg (ref 26.0–34.0)
MCH: 30.3 pg (ref 26.0–34.0)
MCH: 30.9 pg (ref 26.0–34.0)
MCH: 31.1 pg (ref 26.0–34.0)
MCHC: 30.7 g/dL (ref 30.0–36.0)
MCHC: 30.1 g/dL (ref 30.0–36.0)
MCHC: 30.5 g/dL (ref 30.0–36.0)
MCHC: 30.5 g/dL (ref 30.0–36.0)
MCV: 98.3 fL (ref 80.0–100.0)
MCV: 100.7 fL — ABNORMAL HIGH (ref 80.0–100.0)
MCV: 101.1 fL — ABNORMAL HIGH (ref 80.0–100.0)
MCV: 101.9 fL — ABNORMAL HIGH (ref 80.0–100.0)
Platelets: 161 10*3/uL (ref 150–400)
Platelets: 157 10*3/uL (ref 150–400)
Platelets: 154 10*3/uL (ref 150–400)
Platelets: 146 10*3/uL — ABNORMAL LOW (ref 150–400)
RBC: 2.98 MIL/uL — ABNORMAL LOW (ref 3.87–5.11)
RBC: 2.94 MIL/uL — ABNORMAL LOW (ref 3.87–5.11)
RBC: 2.69 MIL/uL — ABNORMAL LOW (ref 3.87–5.11)
RBC: 2.64 MIL/uL — ABNORMAL LOW (ref 3.87–5.11)
RDW: 13.2 % (ref 11.5–15.5)
RDW: 13.2 % (ref 11.5–15.5)
RDW: 13.5 % (ref 11.5–15.5)
RDW: 13.5 % (ref 11.5–15.5)
WBC: 10.4 10*3/uL (ref 4.0–10.5)
WBC: 11 10*3/uL — ABNORMAL HIGH (ref 4.0–10.5)
WBC: 12.1 10*3/uL — ABNORMAL HIGH (ref 4.0–10.5)
WBC: 9.5 10*3/uL (ref 4.0–10.5)
nRBC: 0 % (ref 0.0–0.2)
nRBC: 0 % (ref 0.0–0.2)
nRBC: 0 % (ref 0.0–0.2)
nRBC: 0 % (ref 0.0–0.2)

## 2019-01-03 LAB — CREATININE, URINE, RANDOM: Creatinine, Urine: 222.02 mg/dL

## 2019-01-03 LAB — BASIC METABOLIC PANEL
Anion gap: 5 (ref 5–15)
Anion gap: 9 (ref 5–15)
Anion gap: 8 (ref 5–15)
Anion gap: 8 (ref 5–15)
BUN: 35 mg/dL — ABNORMAL HIGH (ref 8–23)
BUN: 37 mg/dL — ABNORMAL HIGH (ref 8–23)
BUN: 41 mg/dL — ABNORMAL HIGH (ref 8–23)
BUN: 38 mg/dL — ABNORMAL HIGH (ref 8–23)
CO2: 21 mmol/L — ABNORMAL LOW (ref 22–32)
CO2: 18 mmol/L — ABNORMAL LOW (ref 22–32)
CO2: 19 mmol/L — ABNORMAL LOW (ref 22–32)
CO2: 19 mmol/L — ABNORMAL LOW (ref 22–32)
Calcium: 8.5 mg/dL — ABNORMAL LOW (ref 8.9–10.3)
Calcium: 8.8 mg/dL — ABNORMAL LOW (ref 8.9–10.3)
Calcium: 8.3 mg/dL — ABNORMAL LOW (ref 8.9–10.3)
Calcium: 8 mg/dL — ABNORMAL LOW (ref 8.9–10.3)
Chloride: 105 mmol/L (ref 98–111)
Chloride: 103 mmol/L (ref 98–111)
Chloride: 106 mmol/L (ref 98–111)
Chloride: 107 mmol/L (ref 98–111)
Creatinine, Ser: 1.96 mg/dL — ABNORMAL HIGH (ref 0.44–1.00)
Creatinine, Ser: 2.23 mg/dL — ABNORMAL HIGH (ref 0.44–1.00)
Creatinine, Ser: 2.46 mg/dL — ABNORMAL HIGH (ref 0.44–1.00)
Creatinine, Ser: 2.11 mg/dL — ABNORMAL HIGH (ref 0.44–1.00)
GFR calc Af Amer: 31 mL/min — ABNORMAL LOW (ref >60)
GFR calc Af Amer: 26 mL/min — ABNORMAL LOW (ref >60)
GFR calc Af Amer: 23 mL/min — ABNORMAL LOW (ref >60)
GFR calc Af Amer: 28 mL/min — ABNORMAL LOW (ref >60)
GFR calc non Af Amer: 26 mL/min — ABNORMAL LOW (ref >60)
GFR calc non Af Amer: 23 mL/min — ABNORMAL LOW (ref >60)
GFR calc non Af Amer: 20 mL/min — ABNORMAL LOW (ref >60)
GFR calc non Af Amer: 24 mL/min — ABNORMAL LOW (ref >60)
Glucose, Bld: 192 mg/dL — ABNORMAL HIGH (ref 70–99)
Glucose, Bld: 208 mg/dL — ABNORMAL HIGH (ref 70–99)
Glucose, Bld: 229 mg/dL — ABNORMAL HIGH (ref 70–99)
Glucose, Bld: 269 mg/dL — ABNORMAL HIGH (ref 70–99)
Potassium: 6 mmol/L — ABNORMAL HIGH (ref 3.5–5.1)
Potassium: 5.9 mmol/L — ABNORMAL HIGH (ref 3.5–5.1)
Potassium: 5.3 mmol/L — ABNORMAL HIGH (ref 3.5–5.1)
Potassium: 5.3 mmol/L — ABNORMAL HIGH (ref 3.5–5.1)
Sodium: 131 mmol/L — ABNORMAL LOW (ref 135–145)
Sodium: 130 mmol/L — ABNORMAL LOW (ref 135–145)
Sodium: 133 mmol/L — ABNORMAL LOW (ref 135–145)
Sodium: 134 mmol/L — ABNORMAL LOW (ref 135–145)

## 2019-01-03 LAB — GLUCOSE, CAPILLARY
Glucose-Capillary: 202 mg/dL — ABNORMAL HIGH (ref 70–99)
Glucose-Capillary: 245 mg/dL — ABNORMAL HIGH (ref 70–99)
Glucose-Capillary: 158 mg/dL — ABNORMAL HIGH (ref 70–99)
Glucose-Capillary: 274 mg/dL — ABNORMAL HIGH (ref 70–99)

## 2019-01-03 LAB — PROTIME-INR
INR: 1.2 (ref 0.8–1.2)
Prothrombin Time: 15.1 seconds (ref 11.4–15.2)

## 2019-01-03 LAB — SODIUM, URINE, RANDOM: Sodium, Ur: 35 mmol/L

## 2019-01-03 LAB — PHOSPHORUS: Phosphorus: 2.9 mg/dL (ref 2.5–4.6)

## 2019-01-03 MED ORDER — SODIUM CHLORIDE 0.9 % IV SOLN: INTRAVENOUS | Status: DC

## 2019-01-03 MED ORDER — INSULIN GLARGINE 100 UNIT/ML ~~LOC~~ SOLN
26.0000 [IU] | Freq: Every day | SUBCUTANEOUS | Status: DC
  Administered 2019-01-04 – 2019-01-05 (×2): 26 [IU] via SUBCUTANEOUS
  Filled 2019-01-03 (×2): qty 0.26

## 2019-01-03 MED ORDER — HEPARIN SODIUM (PORCINE) 5000 UNIT/ML IJ SOLN
5000.0000 [IU] | Freq: Three times a day (TID) | INTRAMUSCULAR | Status: DC
  Administered 2019-01-03 (×3): 5000 [IU] via SUBCUTANEOUS
  Filled 2019-01-03 (×4): qty 1

## 2019-01-03 MED ORDER — HEPARIN (PORCINE) 25000 UT/250ML-% IV SOLN
850.0000 [IU]/h | INTRAVENOUS | Status: DC
  Administered 2019-01-03: 900 [IU]/h via INTRAVENOUS
  Administered 2019-01-05: 04:00:00 850 [IU]/h via INTRAVENOUS
  Filled 2019-01-03: qty 250

## 2019-01-03 MED ORDER — INSULIN GLARGINE 100 UNIT/ML ~~LOC~~ SOLN
30.0000 [IU] | Freq: Every day | SUBCUTANEOUS | Status: DC
  Administered 2019-01-03 – 2019-01-04 (×2): 30 [IU] via SUBCUTANEOUS
  Filled 2019-01-03 (×3): qty 0.3

## 2019-01-03 MED ORDER — SODIUM CHLORIDE 0.9 % IV BOLUS
250.0000 mL | Freq: Once | INTRAVENOUS | Status: AC
  Administered 2019-01-03: 250 mL via INTRAVENOUS

## 2019-01-03 NOTE — Progress Notes (Signed)
ANTICOAGULATION CONSULT NOTE - Initial Consult  Pharmacy Consult for IV heparin Indication: atrial fibrillation/mitral and tricuspid valve replacements  Allergies  Allergen Reactions  . Chlorhexidine     Patient Measurements: Height: 5\' 6"  (167.6 cm) Weight: 225 lb 1.4 oz (102.1 kg) IBW/kg (Calculated) : 59.3 Heparin Dosing Weight: 72 kg  Vital Signs: Temp: 98.9 F (37.2 C) (12/24 2042) Temp Source: Oral (12/24 1849) BP: 123/55 (12/24 2042) Pulse Rate: 77 (12/24 2042)  Labs: Recent Labs    01/01/19 0600 01/02/19 0710 01/03/19 0416 01/03/19 0744 01/03/19 1418 01/03/19 2033  HGB  --   --  9.0* 8.9* 8.3* 8.2*  HCT  --   --  29.3* 29.6* 27.2* 26.9*  PLT  --   --  161 157 154 146*  APTT 50* 39*  --   --   --   --   LABPROT 15.2 14.8 15.1  --   --   --   INR 1.2 1.2 1.2  --   --   --   CREATININE  --   --  1.96* 2.23* 2.46* 2.11*    Estimated Creatinine Clearance: 32.5 mL/min (A) (by C-G formula based on SCr of 2.11 mg/dL (H)).   Medical History: Past Medical History:  Diagnosis Date  . Abnormal chest CT   . Aortic atherosclerosis ([place])   . Asthma   . Atrial enlargement, left    severe  . Atrial fibrillation ([place]) 01/05/2011   Chronic persistent, failed DCCV   . Atrial flutter ([place])   . Bronchospasm 1998  . Cardiomegaly   . Carotid bruit   . Chronic diastolic congestive heart failure ([place])   . Diabetes mellitus    insulin dependent  . Ejection fraction < 50%    35-40%,   . Heart murmur   . History of blood transfusion   . Hypercholesterolemia   . Hypertension   . Junctional rhythm 09/14/2018   Noted on EKG  . Left bundle branch block (LBBB) 09/14/2018   Noted on EKG  . LVH (left ventricular hypertrophy) 10/10/2016   Moderate, Noted on ECHO  . Mitral regurgitation   . Obesity   . Obesity (BMI 30-39.9) 01/16/2012  . Persistent atrial fibrillation ([place])   . Polyp of rectum   . Rheumatic fever 01/16/2012   Reported during childhood  . S/P M[place]  operation for atrial fibrillation 02/15/2012   Complete biatrial lesion set using bipolar radiofrequency and cryothermy ablation with clipping of LA appendage  . S/P mitral valve replacement with metallic valve [id number]   62mm Sorin [employer]omedics Optiform mechanical prosthesis  . 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 Shortness of breath    with exertion  . Sleep apnea    DOES NOT HAVE CPAP  . Stroke [facility] 2014   Post op , left arm weakness  . Tricuspid regurgitation 01/16/2012  . Uterine cancer ([facility])     Medications:  Scheduled:  . acetaminophen  1,000 mg Oral Q6H  . amiodarone  200 mg Oral Daily  . atorvastatin  40 mg Oral Daily  . carvedilol  12.5 mg Oral BID WC  . insulin aspart  0-15 Units Subcutaneous TID WC  . insulin aspart  0-5 Units Subcutaneous QHS  . [START ON 01/04/2019] insulin glargine  26 Units Subcutaneous Q breakfast  . insulin glargine  30 Units Subcutaneous QHS  . senna-docusate  2 tablet Oral QHS   Infusions:  . sodium chloride 100 mL/hr at  01/03/19 2124    Assessment: 63 yoF onc chronic warfarin for a-fib and valve replacements now post-op with AKI. MD wants IV heparin  H/H = 8.2/26.6, plts = 146, aptt = 39, INR = 1.2 Received 5000 units SQ heparin 2122  Goal of Therapy:  Heparin level 0.3-0.7 units/ml Monitor platelets by anticoagulation protocol: Yes   Plan:  Start heparin drip at 900 units/hr (No bolus) Check 1st HL in 8 hours Daily HL/CBC  Lawana Pai R 01/03/2019,9:46 PM

## 2019-01-03 NOTE — Progress Notes (Signed)
Patient had not urinated since this morning.  Bladder scan performed with no urine.  Dr. Berline Lopes notified, with plans to start fluids and bolus.  Will hopefully be able to send urine specimen once patient makes urine.

## 2019-01-03 NOTE — Progress Notes (Addendum)
1 Day Post-Op Procedure(s) (LRB): XI ROBOTIC ASSISTED LAPAROSCOPIC TOTAL HYSTERECTOMY WITH BILATERAL SALPINGOOPHORECTOMY, SENTINEL LYMPH NODE BIOPSY (Bilateral)  Subjective: Patient reports doing very well this morning.  She was able to sleep without difficulty.  She endorses excellent pain control.  She feels some gas in her belly and reports flatus.  Voiding without difficulty, voided once overnight.  Tolerated solid food last night and this morning without nausea or emesis.  Denies back pain.  Notes mild tremor in her hands since surgery.  Ambulating without symptoms.  Denies any chest pain, palpitations or shortness of breath.  Objective: Vital signs in last 24 hours: Temp:  [97.4 F (36.3 C)-99 F (37.2 C)] 99 F (37.2 C) (12/24 0531) Pulse Rate:  [52-75] 60 (12/24 0531) Resp:  [14-19] 16 (12/24 0531) BP: (107-147)/(49-72) 118/59 (12/24 0531) SpO2:  [96 %-100 %] 96 % (12/24 0531) Weight:  [225 lb 1.4 oz (102.1 kg)] 225 lb 1.4 oz (102.1 kg) (12/23 1246) Last BM Date: 12/31/18  Intake/Output from previous day: 12/23 0701 - 12/24 0700 In: 3406.8 [P.O.:240; I.V.:3166.8] Out: 625 [Urine:600; Blood:25]  Physical Examination: General: No acute distress, alert, oriented to place, time and person HEENT: Atraumatic, no scleral icterus Cardiovascular: Regular rate, systolic murmur.  No rubs or gallops. Pulmonary: Lungs clear to auscultation bilaterally.  No wheezes or rhonchi. Gastrointestinal: Soft, nondistended, nontender.  Incisions are clean dry and intact without erythema or exudate.  Dermabond intact.  Normoactive bowel sounds.  Some bruising in bilateral lower quadrants consistent with recent Lovenox use. Back: No CVA tenderness. Extremities: Trace edema, normal range of motion.  Patient has intermittent clonic movement of her hands at rest.  Vesiculations of her hand muscles can be seen as well.  Labs: CBC    Component Value Date/Time   WBC 11.0 (H) 01/03/2019 0744   RBC 2.94  (L) 01/03/2019 0744   HGB 8.9 (L) 01/03/2019 0744   HGB 11.0 (L) 12/14/2018 0853   HCT 29.6 (L) 01/03/2019 0744   HCT 32.9 (L) 12/14/2018 0853   PLT 157 01/03/2019 0744   PLT 180 12/14/2018 0853   MCV 100.7 (H) 01/03/2019 0744   MCV 92 12/14/2018 0853   MCH 30.3 01/03/2019 0744   MCHC 30.1 01/03/2019 0744   RDW 13.2 01/03/2019 0744   RDW 12.2 12/14/2018 0853   LYMPHSABS 1,440 05/28/2015 0958   MONOABS 384 05/28/2015 0958   EOSABS 144 05/28/2015 0958   BASOSABS 0 05/28/2015 0958   CMP Latest Ref Rng & Units 01/03/2019 01/03/2019 11/30/2018  Glucose 70 - 99 mg/dL 208(H) 192(H) 106(H)  BUN 8 - 23 mg/dL 37(H) 35(H) 14  Creatinine 0.44 - 1.00 mg/dL 2.23(H) 1.96(H) 0.92  Sodium 135 - 145 mmol/L 130(L) 131(L) 139  Potassium 3.5 - 5.1 mmol/L 5.9(H) 6.0(H) 4.0  Chloride 98 - 111 mmol/L 103 105 107  CO2 22 - 32 mmol/L 18(L) 21(L) 23  Calcium 8.9 - 10.3 mg/dL 8.8(L) 8.5(L) 9.2  Total Protein 6.5 - 8.1 g/dL - - 8.1  Total Bilirubin 0.3 - 1.2 mg/dL - - 0.6  Alkaline Phos 38 - 126 U/L - - 102  AST 15 - 41 U/L - - 28  ALT 0 - 44 U/L - - 21     Assessment:  [name], SENTINEL LYMPH NODE BIOPSY: Subjectively meeting all postoperative milestones but with evidence of acute kidney injury, hyperkalemia and hyponatremia.  Pain:  Pain control is excellent on oral medications.  Heme: Acute on chronic anemia.  Minimal blood loss during surgery yesterday.  Suspect greater than expected decrease in hemoglobin secondary to IV fluid administration overnight.  Repeat check this morning showed stable H&H.  We will repeat labs mid afternoon.  Patient has been ambulatory and is asymptomatic.  CV: Significant cardiac history including atrial fibrillation, chronic diastolic congestive heart failure, hypertension and both mitral and tricuspid regurgitation, status post mitral and tricuspid valve  replacements in 2014.  Patient was continued on her cardiac medications.  Blood pressure has been on the low side of normal overnight, suspect secondary to anesthesia.  AKI may be related to hypoperfusion and relative hypotension for the patient.  I have reached out to the outpatient pharmacist to help follow her from a cardiac standpoint.  Given hyperkalemia, we will plan to move to telemetry unit for continuous cardiac monitoring.  GI:  Tolerating po: Yes    FEN: Discontinued IV fluids this morning.  Will encourage p.o. intake.  Endo:   From a diabetes perspective, we will continue regular blood glucose monitoring with insulin as indicated.  Hyperglycemic overnight in the setting of IV fluids and steroids with surgery.  Goal is for CBGs under 200.  GU: Acute kidney injury in the postoperative setting.  Likely factorial including relative hypotension and recent medications that are nephrotoxic (Lovenox, intra and postoperative medications).  Have discontinued postoperative nephrotoxic medications.  We will plan for renal ultrasound and repeat BMP in the mid afternoon.  Bilateral ureters well-visualized during surgery.  Low suspicion for ureteral or bladder injury. Plan for strict I&Os.  Neuro: Suspect fasciculations and tremor at rest secondary to recent anesthesia.  We will continue to monitor.  Prophylaxis: Plan for initiation of prophylactic heparin this morning given creatinine and creatinine to needing clearance (not a candidate for Lovenox currently).  If AKI resolves, patient can be discharged home on planned therapeutic Lovenox bridge.  Plan: Meeting postoperative milestones, we will plan to work-up AKI. Dispo:  Discharge pending improvement of kidney function, normalization of labs. The patient is to be discharged to home.   LOS: 0 days    [name] 01/03/2019, 8:32 AM

## 2019-01-04 ENCOUNTER — Observation Stay (HOSPITAL_COMMUNITY): Payer: Medicare HMO

## 2019-01-04 DIAGNOSIS — N83201 Unspecified ovarian cyst, right side: Secondary | ICD-10-CM | POA: Diagnosis not present

## 2019-01-04 DIAGNOSIS — I110 Hypertensive heart disease with heart failure: Secondary | ICD-10-CM | POA: Diagnosis not present

## 2019-01-04 DIAGNOSIS — I4819 Other persistent atrial fibrillation: Secondary | ICD-10-CM | POA: Diagnosis not present

## 2019-01-04 DIAGNOSIS — C541 Malignant neoplasm of endometrium: Secondary | ICD-10-CM | POA: Diagnosis not present

## 2019-01-04 DIAGNOSIS — N83202 Unspecified ovarian cyst, left side: Secondary | ICD-10-CM | POA: Diagnosis not present

## 2019-01-04 DIAGNOSIS — Z90710 Acquired absence of both cervix and uterus: Secondary | ICD-10-CM | POA: Diagnosis not present

## 2019-01-04 DIAGNOSIS — D259 Leiomyoma of uterus, unspecified: Secondary | ICD-10-CM | POA: Diagnosis not present

## 2019-01-04 DIAGNOSIS — N9489 Other specified conditions associated with female genital organs and menstrual cycle: Secondary | ICD-10-CM | POA: Diagnosis not present

## 2019-01-04 DIAGNOSIS — E1165 Type 2 diabetes mellitus with hyperglycemia: Secondary | ICD-10-CM | POA: Diagnosis not present

## 2019-01-04 DIAGNOSIS — N736 Female pelvic peritoneal adhesions (postinfective): Secondary | ICD-10-CM | POA: Diagnosis not present

## 2019-01-04 LAB — CBC
HCT: 28.3 % — ABNORMAL LOW (ref 36.0–46.0)
HCT: 28.8 % — ABNORMAL LOW (ref 36.0–46.0)
Hemoglobin: 8.5 g/dL — ABNORMAL LOW (ref 12.0–15.0)
Hemoglobin: 8.6 g/dL — ABNORMAL LOW (ref 12.0–15.0)
MCH: 30.2 pg (ref 26.0–34.0)
MCH: 30.4 pg (ref 26.0–34.0)
MCHC: 29.9 g/dL — ABNORMAL LOW (ref 30.0–36.0)
MCHC: 30 g/dL (ref 30.0–36.0)
MCV: 101.1 fL — ABNORMAL HIGH (ref 80.0–100.0)
MCV: 101.1 fL — ABNORMAL HIGH (ref 80.0–100.0)
Platelets: 150 10*3/uL (ref 150–400)
Platelets: 150 10*3/uL (ref 150–400)
RBC: 2.8 MIL/uL — ABNORMAL LOW (ref 3.87–5.11)
RBC: 2.85 MIL/uL — ABNORMAL LOW (ref 3.87–5.11)
RDW: 13.7 % (ref 11.5–15.5)
RDW: 13.7 % (ref 11.5–15.5)
WBC: 8.3 10*3/uL (ref 4.0–10.5)
WBC: 8.7 10*3/uL (ref 4.0–10.5)
nRBC: 0 % (ref 0.0–0.2)
nRBC: 0 % (ref 0.0–0.2)

## 2019-01-04 LAB — BASIC METABOLIC PANEL
Anion gap: 5 (ref 5–15)
Anion gap: 7 (ref 5–15)
BUN: 31 mg/dL — ABNORMAL HIGH (ref 8–23)
BUN: 29 mg/dL — ABNORMAL HIGH (ref 8–23)
CO2: 22 mmol/L (ref 22–32)
CO2: 21 mmol/L — ABNORMAL LOW (ref 22–32)
Calcium: 8.2 mg/dL — ABNORMAL LOW (ref 8.9–10.3)
Calcium: 8.3 mg/dL — ABNORMAL LOW (ref 8.9–10.3)
Chloride: 109 mmol/L (ref 98–111)
Chloride: 108 mmol/L (ref 98–111)
Creatinine, Ser: 1.42 mg/dL — ABNORMAL HIGH (ref 0.44–1.00)
Creatinine, Ser: 1.39 mg/dL — ABNORMAL HIGH (ref 0.44–1.00)
GFR calc Af Amer: 45 mL/min — ABNORMAL LOW (ref >60)
GFR calc Af Amer: 46 mL/min — ABNORMAL LOW (ref >60)
GFR calc non Af Amer: 39 mL/min — ABNORMAL LOW (ref >60)
GFR calc non Af Amer: 40 mL/min — ABNORMAL LOW (ref >60)
Glucose, Bld: 167 mg/dL — ABNORMAL HIGH (ref 70–99)
Glucose, Bld: 171 mg/dL — ABNORMAL HIGH (ref 70–99)
Potassium: 4.7 mmol/L (ref 3.5–5.1)
Potassium: 4.4 mmol/L (ref 3.5–5.1)
Sodium: 136 mmol/L (ref 135–145)
Sodium: 136 mmol/L (ref 135–145)

## 2019-01-04 LAB — GLUCOSE, CAPILLARY
Glucose-Capillary: 252 mg/dL — ABNORMAL HIGH (ref 70–99)
Glucose-Capillary: 159 mg/dL — ABNORMAL HIGH (ref 70–99)
Glucose-Capillary: 151 mg/dL — ABNORMAL HIGH (ref 70–99)
Glucose-Capillary: 167 mg/dL — ABNORMAL HIGH (ref 70–99)
Glucose-Capillary: 241 mg/dL — ABNORMAL HIGH (ref 70–99)

## 2019-01-04 LAB — HEPARIN LEVEL (UNFRACTIONATED)
Heparin Unfractionated: 0.52 IU/mL (ref 0.30–0.70)
Heparin Unfractionated: 0.66 IU/mL (ref 0.30–0.70)

## 2019-01-04 LAB — PROTIME-INR
INR: 1.2 (ref 0.8–1.2)
Prothrombin Time: 14.9 seconds (ref 11.4–15.2)

## 2019-01-04 MED ORDER — WARFARIN SODIUM 6 MG PO TABS
6.0000 mg | ORAL_TABLET | Freq: Once | ORAL | Status: AC
  Administered 2019-01-04: 6 mg via ORAL
  Filled 2019-01-04: qty 1

## 2019-01-04 MED ORDER — IOHEXOL 300 MG/ML  SOLN
300.0000 mL | Freq: Once | INTRAMUSCULAR | Status: AC | PRN
  Administered 2019-01-04: 11:00:00 300 mL via URETHRAL

## 2019-01-04 MED ORDER — WARFARIN - PHARMACIST DOSING INPATIENT: Freq: Every day | Status: DC

## 2019-01-04 NOTE — Progress Notes (Addendum)
Pharmacy - IV heparin  Assessment:    Please see note from Theodis Shove, PharmD earlier today for full details.  Briefly, 63 y.o. female with mechanical heart valves on IV heparin for perioperative bridging.   Hgb low but stable postop; Plt stable WNL  Most recent heparin level therapeutic, although rising towards borderline elevated  To restart warfarin today (goal INR 2.5 - 3.5); current INR subtherapeutic as expected after holding warfarin perioperatively  Normally takes warfarin 4 mg daily except 6 mg MWF; chronically on amiodarone with warfarin PTA  Plan:   Reduce heparin infusion to 850 units/hr  Recheck heparin level with AM labs  Warfarin 6 mg PO x 1 tonight  Daily INR  Plan transition back to LMWH bridge once AKI resolved  Reuel Boom, PharmD, BCPS [phone] 01/04/2019, 3:41 PM

## 2019-01-04 NOTE — Progress Notes (Signed)
ANTICOAGULATION CONSULT NOTE - Initial Consult  Pharmacy Consult for IV heparin Indication: atrial fibrillation/mitral and tricuspid valve replacements  Allergies  Allergen Reactions  . Chlorhexidine     Patient Measurements: Height: 5\' 6"  (167.6 cm) Weight: 225 lb 1.4 oz (102.1 kg) IBW/kg (Calculated) : 59.3 Heparin Dosing Weight: 82 kg  Vital Signs: Temp: 98.3 F (36.8 C) (12/25 0800) BP: 127/65 (12/25 0800) Pulse Rate: 63 (12/25 0800)  Labs: Recent Labs    01/02/19 0710 01/03/19 0416 01/03/19 1418 01/03/19 2033 01/04/19 0638  HGB  --  9.0* 8.3* 8.2* 8.6*  8.5*  HCT  --  29.3* 27.2* 26.9* 28.8*  28.3*  PLT  --  161 154 146* 150  150  APTT 39*  --   --   --   --   LABPROT 14.8 15.1  --   --  14.9  INR 1.2 1.2  --   --  1.2  HEPARINUNFRC  --   --   --   --  0.52  CREATININE  --  1.96* 2.46* 2.11* 1.42*    Estimated Creatinine Clearance: 48.3 mL/min (A) (by C-G formula based on SCr of 1.42 mg/dL (H)).   Medical History: Past Medical History:  Diagnosis Date  . Abnormal chest CT   . Aortic atherosclerosis ([place])   . Asthma   . Atrial enlargement, left    severe  . Atrial fibrillation ([place]) 01/05/2011   Chronic persistent, failed DCCV   . Atrial flutter ([place])   . Bronchospasm 1998  . Cardiomegaly   . Carotid bruit   . Chronic diastolic congestive heart failure ([place])   . Diabetes mellitus    insulin dependent  . Ejection fraction < 50%    35-40%,   . Heart murmur   . History of blood transfusion   . Hypercholesterolemia   . Hypertension   . Junctional rhythm 09/14/2018   Noted on EKG  . Left bundle branch block (LBBB) 09/14/2018   Noted on EKG  . LVH (left ventricular hypertrophy) 10/10/2016   Moderate, Noted on ECHO  . Mitral regurgitation   . Obesity   . Obesity (BMI 30-39.9) 01/16/2012  . Persistent atrial fibrillation ([place])   . Polyp of rectum   . Rheumatic fever 01/16/2012   Reported during childhood  . S/P Maze operation for atrial  fibrillation 02/15/2012   Complete biatrial lesion set using bipolar radiofrequency and cryothermy ablation with clipping of LA appendage  . S/P mitral valve replacement with metallic valve [id number]   58mm Sorin Carbomedics Optiform mechanical prosthesis  . 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 Shortness of breath    with exertion  . Sleep apnea    DOES NOT HAVE CPAP  . Stroke [facility]) 2014   Post op , left arm weakness  . Tricuspid regurgitation 01/16/2012  . Uterine cancer (HCC)     Medications:  Pt on warfarin PTA for afib, mechanical valves (mitral, tricuspid).   Assessment: 63 yoF on chronic warfarin PTA for afib, mechanical valves. Pt admitted for planned surgery, initial plan for therapeutic LMWH bridging with warfarin post-op. Given elevated SCr on 12/24, anticoagulation transitioned to heparin drip - pharmacy to dose.  Today, 01/04/19  HL = 0.52 is therapeutic on heparin infusion of 900 units/hr  Confirmed with RN that heparin infusing at correct rate. No signs/symptoms of bleeding.  INR 1.2 remains subtherapeutic, warfarin not yet resumed by MD  Hgb 8.5 - low but stable  post-op  Plt - WNL  SCr 1.42, CrCl 48 mL/min. Improved.  Goal of Therapy:  Heparin level 0.3-0.7 units/ml Monitor platelets by anticoagulation protocol: Yes   Plan:   Continue heparin infusion at current rate of 900 units/hr  Check confirmatory HL in 6 hours  CBC and HL daily while on heparin infusion  Monitor for signs/symptoms of bleeding  Per MD note, once AKI resolves, planning to transition to LMWH bridge therapy with warfarin.   Lenis Noon, PharmD 01/04/2019,9:06 AM

## 2019-01-04 NOTE — Progress Notes (Signed)
2 Days Post-Op Procedure(s) (LRB): XI ROBOTIC ASSISTED LAPAROSCOPIC TOTAL HYSTERECTOMY WITH BILATERAL SALPINGOOPHORECTOMY, SENTINEL LYMPH NODE BIOPSY (Bilateral)  Subjective: Patient reports doing well. Tolerating PO without N/V. Foley in place. +flatus, no BM. Ambulating frequently without symptoms. Denies back pain. Tremor resolved.   Objective: Vital signs in last 24 hours: Temp:  [98.1 F (36.7 C)-98.9 F (37.2 C)] 98.2 F (36.8 C) (12/25 0458) Pulse Rate:  [62-77] 76 (12/25 0458) Resp:  [17-19] 17 (12/25 0458) BP: (109-128)/(51-65) 128/65 (12/25 0458) SpO2:  [96 %-100 %] 99 % (12/25 0458) Last BM Date: 12/31/18  Intake/Output from previous day: 12/24 0701 - 12/25 0700 In: 3303.1 [P.O.:1680; I.V.:1373.1; IV Piggyback:250] Out: [id number] [Urine:825; Stool:410] - erroneous charting (no stool, UOP 1235 over last 24 hrs)  Physical Examination: General: No acute distress, alert, oriented to place, time and person HEENT: Atraumatic, no scleral icterus Cardiovascular: Regular rate, systolic murmur.  No rubs or gallops. Pulmonary: Lungs clear to auscultation bilaterally.  No wheezes or rhonchi. Gastrointestinal: Soft, nondistended, nontender.  Incisions are clean dry and intact without erythema or exudate.  Dermabond intact.  Normoactive bowel sounds.  Some bruising in bilateral lower quadrants consistent with recent Lovenox use. Extremities: Trace edema, normal range of motion.   Labs: CBC    Component Value Date/Time   WBC 8.3 01/04/2019 0638   WBC 8.7 01/04/2019 0638   RBC 2.80 (L) 01/04/2019 0638   RBC 2.85 (L) 01/04/2019 0638   HGB 8.5 (L) 01/04/2019 0638   HGB 8.6 (L) 01/04/2019 0638   HGB 11.0 (L) 12/14/2018 0853   HCT 28.3 (L) 01/04/2019 0638   HCT 28.8 (L) 01/04/2019 0638   HCT 32.9 (L) 12/14/2018 0853   PLT 150 01/04/2019 0638   PLT 150 01/04/2019 0638   PLT 180 12/14/2018 0853   MCV 101.1 (H) 01/04/2019 0638   MCV 101.1 (H) 01/04/2019 0638   MCV 92 12/14/2018 0853    MCH 30.4 01/04/2019 0638   MCH 30.2 01/04/2019 0638   MCHC 30.0 01/04/2019 0638   MCHC 29.9 (L) 01/04/2019 0638   RDW 13.7 01/04/2019 0638   RDW 13.7 01/04/2019 0638   RDW 12.2 12/14/2018 0853   LYMPHSABS 1,440 05/28/2015 0958   MONOABS 384 05/28/2015 0958   EOSABS 144 05/28/2015 0958   BASOSABS 0 05/28/2015 0958   CMP Latest Ref Rng & Units 01/04/2019 01/03/2019 01/03/2019  Glucose 70 - 99 mg/dL 167(H) 269(H) 229(H)  BUN 8 - 23 mg/dL 31(H) 38(H) 41(H)  Creatinine 0.44 - 1.00 mg/dL 1.42(H) 2.11(H) 2.46(H)  Sodium 135 - 145 mmol/L 136 134(L) 133(L)  Potassium 3.5 - 5.1 mmol/L 4.7 5.3(H) 5.3(H)  Chloride 98 - 111 mmol/L 109 107 106  CO2 22 - 32 mmol/L 22 19(L) 19(L)  Calcium 8.9 - 10.3 mg/dL 8.2(L) 8.0(L) 8.3(L)  Total Protein 6.5 - 8.1 g/dL - - -  Total Bilirubin 0.3 - 1.2 mg/dL - - -  Alkaline Phos 38 - 126 U/L - - -  AST 15 - 41 U/L - - -  ALT 0 - 44 U/L - - -    Assessment:  [name], SENTINEL LYMPH NODE BIOPSY: AKI resolving, meeting post-op milestone.   Pain:  Pain control is excellent on oral medications.  Heme: Acute on chronic anemia.  Asymptomatic, stable.  CV: Significant cardiac history including atrial fibrillation, chronic diastolic congestive heart failure, hypertension and both mitral and tricuspid regurgitation, status post mitral and tricuspid valve replacements  in 2014.  Patient was continued on her cardiac medications.  Amlodipine held yesterday, continued amiodarone and beta blocker. Has been on telemetry given hyperkalemia yesterday.   GI:  Tolerating po: Yes    FEN: Discontinued IV fluids this morning.  Will encourage p.o. intake.  Endo:   From a diabetes perspective, we will continue regular blood glucose monitoring with insulin as indicated, increased glargine yesterday closer to home dose.   Goal is for CBGs under 200.  GU: Acute kidney  injury in the postoperative setting.  Likely multifactorial including relative hypotension and recent medications that are nephrotoxic (Lovenox, intra and postoperative medications).  Renal ultrasound unremarkable yesterday. Foley catheter replaced in the afternoon yesterday with good UOP over last 24 hours. Creatinine downtrending. Will recheck again this afternoon. If back to baseline, will transition from heparin drip to lovenox. Plan for strict I&Os. Will discuss with radiology cystogram today given holiday and likely staffing constraints. Low suspicion for bladder injury but cystotomy could explain significant increase in creatinine yesterday with improvement not after foley catheter placement.  Neuro: Clonic movements resolved, suspected related to AKI and recent anesthesia.   Prophylaxis: Heparin drip started last night. Will plan to transition to therapeutic lovenox as bridge to Coumadin once kidney function normalizes.   Plan: Meeting postoperative milestones, likely discharge later today vs tomorrow. Dispo:  Discharge pending improvement of kidney function, normalization of labs. The patient is to be discharged to home.  LOS: 1 day   [name] 01/04/2019, 7:48 AM

## 2019-01-04 NOTE — Progress Notes (Signed)
Patient voided 500 amber urine at 2230 (bladder scan at 2047, 243cc) Foley insertion  On hold secondary to spontaneous void

## 2019-01-05 DIAGNOSIS — D259 Leiomyoma of uterus, unspecified: Secondary | ICD-10-CM | POA: Diagnosis not present

## 2019-01-05 DIAGNOSIS — C541 Malignant neoplasm of endometrium: Secondary | ICD-10-CM | POA: Diagnosis not present

## 2019-01-05 DIAGNOSIS — I110 Hypertensive heart disease with heart failure: Secondary | ICD-10-CM | POA: Diagnosis not present

## 2019-01-05 DIAGNOSIS — N9489 Other specified conditions associated with female genital organs and menstrual cycle: Secondary | ICD-10-CM | POA: Diagnosis not present

## 2019-01-05 DIAGNOSIS — E1165 Type 2 diabetes mellitus with hyperglycemia: Secondary | ICD-10-CM | POA: Diagnosis not present

## 2019-01-05 DIAGNOSIS — N736 Female pelvic peritoneal adhesions (postinfective): Secondary | ICD-10-CM | POA: Diagnosis not present

## 2019-01-05 DIAGNOSIS — N83202 Unspecified ovarian cyst, left side: Secondary | ICD-10-CM | POA: Diagnosis not present

## 2019-01-05 DIAGNOSIS — N83201 Unspecified ovarian cyst, right side: Secondary | ICD-10-CM | POA: Diagnosis not present

## 2019-01-05 DIAGNOSIS — I4819 Other persistent atrial fibrillation: Secondary | ICD-10-CM | POA: Diagnosis not present

## 2019-01-05 LAB — TYPE AND SCREEN
ABO/RH(D): O POS
Antibody Screen: POSITIVE
Donor AG Type: NEGATIVE
Donor AG Type: NEGATIVE
Unit division: 0
Unit division: 0

## 2019-01-05 LAB — BPAM RBC
Blood Product Expiration Date: 202101132359
Blood Product Expiration Date: 202101142359
Unit Type and Rh: 5100
Unit Type and Rh: 5100

## 2019-01-05 LAB — CBC
HCT: 27.2 % — ABNORMAL LOW (ref 36.0–46.0)
Hemoglobin: 8.2 g/dL — ABNORMAL LOW (ref 12.0–15.0)
MCH: 30.6 pg (ref 26.0–34.0)
MCHC: 30.1 g/dL (ref 30.0–36.0)
MCV: 101.5 fL — ABNORMAL HIGH (ref 80.0–100.0)
Platelets: 176 10*3/uL (ref 150–400)
RBC: 2.68 MIL/uL — ABNORMAL LOW (ref 3.87–5.11)
RDW: 14 % (ref 11.5–15.5)
WBC: 5.3 10*3/uL (ref 4.0–10.5)
nRBC: 0 % (ref 0.0–0.2)

## 2019-01-05 LAB — BASIC METABOLIC PANEL
Anion gap: 5 (ref 5–15)
BUN: 20 mg/dL (ref 8–23)
CO2: 20 mmol/L — ABNORMAL LOW (ref 22–32)
Calcium: 8.5 mg/dL — ABNORMAL LOW (ref 8.9–10.3)
Chloride: 111 mmol/L (ref 98–111)
Creatinine, Ser: 1.1 mg/dL — ABNORMAL HIGH (ref 0.44–1.00)
GFR calc Af Amer: >60 mL/min (ref >60)
GFR calc non Af Amer: 53 mL/min — ABNORMAL LOW (ref >60)
Glucose, Bld: 207 mg/dL — ABNORMAL HIGH (ref 70–99)
Potassium: 4.5 mmol/L (ref 3.5–5.1)
Sodium: 136 mmol/L (ref 135–145)

## 2019-01-05 LAB — HEPARIN LEVEL (UNFRACTIONATED): Heparin Unfractionated: 0.33 IU/mL (ref 0.30–0.70)

## 2019-01-05 LAB — GLUCOSE, CAPILLARY
Glucose-Capillary: 108 mg/dL — ABNORMAL HIGH (ref 70–99)
Glucose-Capillary: 181 mg/dL — ABNORMAL HIGH (ref 70–99)

## 2019-01-05 LAB — PROTIME-INR
INR: 1.1 (ref 0.8–1.2)
Prothrombin Time: 13.9 seconds (ref 11.4–15.2)

## 2019-01-05 MED ORDER — HEPARIN (PORCINE) 25000 UT/250ML-% IV SOLN
900.0000 [IU]/h | INTRAVENOUS | Status: DC
  Filled 2019-01-05: qty 250

## 2019-01-05 MED ORDER — WARFARIN SODIUM 4 MG PO TABS
4.0000 mg | ORAL_TABLET | Freq: Once | ORAL | Status: DC
  Filled 2019-01-05: qty 1

## 2019-01-05 MED ORDER — SENNA 8.6 MG PO TABS: 1.0000 | ORAL_TABLET | Freq: Every day | ORAL | 0 refills | Status: DC | PRN

## 2019-01-05 MED ORDER — ACETAMINOPHEN 500 MG PO TABS: 1000.0000 mg | ORAL_TABLET | Freq: Four times a day (QID) | ORAL | 0 refills | Status: AC

## 2019-01-05 NOTE — Progress Notes (Signed)
3 Days Post-Op Procedure(s) (LRB): XI ROBOTIC ASSISTED LAPAROSCOPIC TOTAL HYSTERECTOMY WITH BILATERAL SALPINGOOPHORECTOMY, SENTINEL LYMPH NODE BIOPSY (Bilateral)  Subjective: Patient reports doing well morning.  She anxious to go home.  She is tolerating p.o. intake without nausea or emesis.  She reports continued flatus and had a bowel movement last night.  She is voiding spontaneously and had multiple voids overnight.  She denies any vaginal bleeding.  She denies any chest pain or shortness of breath.  She is ambulating without symptoms.  Objective: Vital signs in last 24 hours: Temp:  [98.2 F (36.8 C)-98.8 F (37.1 C)] 98.7 F (37.1 C) (12/26 0407) Pulse Rate:  [57-73] 57 (12/26 0407) Resp:  [16-20] 16 (12/26 0407) BP: (106-128)/(53-69) 106/60 (12/26 0407) SpO2:  [96 %-100 %] 97 % (12/26 0407) Last BM Date: 12/31/18  Intake/Output from previous day: 12/25 0701 - 12/26 0700 In: 2597.7 [P.O.:2140; I.V.:207.7] Out: 1850 [Urine:1850]  Physical Examination: General: No acute distress, alert, oriented to place, time and person HEENT: Atraumatic, no scleral icterus Cardiovascular: Regular rate, systolic murmur.  No rubs or gallops. Pulmonary: Lungs clear to auscultation bilaterally.  No wheezes or rhonchi. Gastrointestinal: Soft, nondistended, nontender.  Incisions are clean dry and intact without erythema or exudate.  Dermabond intact.  Normoactive bowel sounds.  Some bruising in bilateral lower quadrants consistent with recent Lovenox use. Extremities: Trace edema, normal range of motion.   Labs: Am labs pending  CBC    Component Value Date/Time   WBC 8.3 01/04/2019 0638   WBC 8.7 01/04/2019 0638   RBC 2.80 (L) 01/04/2019 0638   RBC 2.85 (L) 01/04/2019 0638   HGB 8.5 (L) 01/04/2019 0638   HGB 8.6 (L) 01/04/2019 0638   HGB 11.0 (L) 12/14/2018 0853   HCT 28.3 (L) 01/04/2019 0638   HCT 28.8 (L) 01/04/2019 0638   HCT 32.9 (L) 12/14/2018 0853   PLT 150 01/04/2019 0638   PLT  150 01/04/2019 0638   PLT 180 12/14/2018 0853   MCV 101.1 (H) 01/04/2019 0638   MCV 101.1 (H) 01/04/2019 0638   MCV 92 12/14/2018 0853   MCH 30.4 01/04/2019 0638   MCH 30.2 01/04/2019 0638   MCHC 30.0 01/04/2019 0638   MCHC 29.9 (L) 01/04/2019 0638   RDW 13.7 01/04/2019 0638   RDW 13.7 01/04/2019 0638   RDW 12.2 12/14/2018 0853   LYMPHSABS 1,440 05/28/2015 0958   MONOABS 384 05/28/2015 0958   EOSABS 144 05/28/2015 0958   BASOSABS 0 05/28/2015 0958   CMP Latest Ref Rng & Units 01/04/2019 01/04/2019 01/03/2019  Glucose 70 - 99 mg/dL 171(H) 167(H) 269(H)  BUN 8 - 23 mg/dL 29(H) 31(H) 38(H)  Creatinine 0.44 - 1.00 mg/dL 1.39(H) 1.42(H) 2.11(H)  Sodium 135 - 145 mmol/L 136 136 134(L)  Potassium 3.5 - 5.1 mmol/L 4.4 4.7 5.3(H)  Chloride 98 - 111 mmol/L 108 109 107  CO2 22 - 32 mmol/L 21(L) 22 19(L)  Calcium 8.9 - 10.3 mg/dL 8.3(L) 8.2(L) 8.0(L)  Total Protein 6.5 - 8.1 g/dL - - -  Total Bilirubin 0.3 - 1.2 mg/dL - - -  Alkaline Phos 38 - 126 U/L - - -  AST 15 - 41 U/L - - -  ALT 0 - 44 U/L - - -    Assessment:  [name], SENTINEL LYMPH NODE BIOPSY: Postoperative milestones, AKI improving.  Pain: Pain is well controlled on oral regimen.  Heme: Acute on chronic anemia in the  setting of surgery.  Asymptomatic when ambulating.  We will plan to discharge home on iron.  CV: Patient with significant cardiac history, had Lovenox bridge prior to surgery with plan for Lovenox bridge postop while awaiting therapeutic INR on Coumadin.  In the setting of AKI, patient is on a heparin drip until kidney function improved.  At that point, will transition to therapeutic Lovenox and discharged home.  Patient has an appointment for INR check on the 28th.  Cardiac pharmacy following outpatient.  Have continued patient on her amiodarone and beta-blocker, holding amlodipine and other cardiac  medications.  Blood pressures normal range to slightly low for patient.  GI/FEN: Tolerating a regular diet.  Fluids discontinued yesterday.  Now with return of bowel function.  Endo: Blood glucose for the most part under 200.  Continue insulin regimen.  Prophylaxis: Patient on therapeutic heparin drip with plan to transition to twice daily Lovenox once creatinine improves.  GU: Suspect AKI related to Lovenox administration preop as well as low normal blood pressures and hypoperfusion intra and postop.  Given acute increase in creatinine, cystogram performed yesterday to assure no bladder injury causing increase in creatinine secondary to reabsorption.  Cystogram showed bladder intact.  Foley catheter was removed yesterday.  I suspect that the patient has some baseline urinary retention but ultimately voided spontaneously multiple times overnight.  Plan: Plan for discharge home, hopefully later today, pending results of BMP.  Patient is to be discharged home.  She is close follow-up with her cardiologist office early next week.    LOS: 0 days    [name] 01/05/2019, 7:51 AM

## 2019-01-05 NOTE — Discharge Summary (Signed)
Physician Discharge Summary  Patient ID: [name] MRN: [id number] DOB/AGE: 04-12-1955 63 y.o.  Admit date: 01/02/2019 Discharge date: 01/05/2019  Admission Diagnoses: High grade uterine cancer  Discharge Diagnoses:  Active Problems:   Uterine cancer [facility])   Endometrial cancer [facility])   Discharged Condition: good  Hospital Course:  1/ patient was admitted on 12/23 for robotic uterine cancer staging. Her surgery had been delayed on two occasions secondary to her INR being elevated and then to having in her Lovenox which wonders before scheduled 3.  2/ surgery was uncomplicated with minimal blood loss. 3/ on postoperative day 1, the patient was noted to have an acute kidney injury.  By postoperative day #3, the patient's creatinine had almost normalized.  A cystogram was performed to rule out any bladder injury causing acute rise in creatinine.  No injury was noted to the bladder on this study.  Nephrotoxic medications were held including some of the patient home cardiac meds.  With some fluid resuscitation, the patient's creatinine improved.  Her AKI was likely secondary to preoperatively Lovenox bridge, perioperative medications including anesthesia, and low-normal blood pressures perioperatively.   Given her acute kidney injury, the patient was started on a heparin drip on the evening of postoperative day #1.  By postoperative day #3, her creatinine had improved sufficiently to transition from a heparin drip to twice daily therapeutic Lovenox until she is therapeutic on her Coumadin.  Her Coumadin was started again on POD#2. On POD#3, The patient was meeting discharge criteria: tolerating PO, voiding urine, ambulating, pain well controlled on oral medications, and reporting return of bowel function.  4/ new medications on discharge include Tylenol, Senokot.  Was asked to hold her ARB, calcium channel blocker, and insulin.   Consults: None  Significant Diagnostic Studies: Cystogram -  bladder intact  Treatments: IV hydration, heparin drip  Discharge Exam: Blood pressure 113/60, pulse 71, temperature 98.3 F (36.8 C), temperature source Oral, resp. rate 15, height 5\' 6"  (1.676 m), weight 225 lb 1.4 oz (102.1 kg), SpO2 98 %. General: No acute distress, alert, oriented to place, time and person HEENT: Atraumatic, no scleral icterus Cardiovascular: Regular rate, systolic murmur. No rubs or gallops. Pulmonary: Lungs clear to auscultation bilaterally. No wheezes or rhonchi. Gastrointestinal: Soft, nondistended, nontender. Incisions are clean dry and intact without erythema or exudate. Dermabond intact. Normoactive bowel sounds. Some bruising in bilateral lower quadrants consistent with recent Lovenox use. Extremities: Trace edema, normal range of motion.    Disposition: Discharge disposition: 01-Home or Self Care        Allergies as of 01/05/2019      Reactions   Chlorhexidine       Medication List    STOP taking these medications   amLODipine 5 MG tablet Commonly known as: NORVASC   furosemide 40 MG tablet Commonly known as: LASIX   irbesartan 300 MG tablet Commonly known as: AVAPRO   potassium chloride SA 20 MEQ tablet Commonly known as: KLOR-CON     TAKE these medications   acetaminophen 500 MG tablet Commonly known as: TYLENOL Take 2 tablets (1,000 mg total) by mouth every 6 (six) hours for 20 doses.   amiodarone 200 MG tablet Commonly known as: PACERONE TAKE 1 TABLET(200 MG) BY MOUTH DAILY What changed: See the new instructions.   atorvastatin 40 MG tablet Commonly known as: LIPITOR Take 1 tablet (40 mg total) by mouth daily.   calcium carbonate 1250 (500 Ca) MG tablet Commonly known as: OS-CAL - dosed in  mg of elemental calcium Take 1 tablet by mouth daily.   carvedilol 12.5 MG tablet Commonly known as: COREG Take 1 tablet (12.5 mg total) by mouth 2 (two) times daily with a meal.   cyclobenzaprine 10 MG tablet Commonly known  as: FLEXERIL Take 10 mg by mouth daily.   enoxaparin 100 MG/ML injection Commonly known as: Lovenox Inject 1 mL (100 mg total) into the skin every 12 (twelve) hours.   Lantus SoloStar 100 UNIT/ML injection Generic drug: insulin glargine Inject 30-35 Units into the skin See admin instructions. Inject 30 units into the skin in the morning and 35 units at bedtime.   metFORMIN 500 MG tablet Commonly known as: GLUCOPHAGE Take 500 mg by mouth 2 (two) times daily with a meal.   methocarbamol 500 MG tablet Commonly known as: ROBAXIN Take 500 mg by mouth every 8 (eight) hours as needed for muscle spasms.   senna 8.6 MG Tabs tablet Commonly known as: SENOKOT Take 1 tablet (8.6 mg total) by mouth daily as needed for up to 15 doses for mild constipation.   Vitamin D 1000 units capsule Take 1,000 Units by mouth daily.   warfarin 4 MG tablet Commonly known as: COUMADIN Take as directed. If you are unsure how to take this medication, talk to your nurse or doctor. Original instructions: Take 1 to 1.5 tablets daily as directed by Coumadin Clinic      Follow-up Information    [name], MD In 1 week.   Specialty: Gynecologic Oncology Why: phone visit Contact information: [place] [address] [phone]        [name], MD In 3 weeks.   Specialty: Gynecologic Oncology Why: clinic visit Contact information: [place] [id number] [phone]           Signed: [name] 01/05/2019, 11:21 AM

## 2019-01-05 NOTE — Progress Notes (Signed)
ANTICOAGULATION CONSULT NOTE - Initial Consult  Pharmacy Consult for IV heparin, warfarin Indication: atrial fibrillation/mitral and tricuspid valve replacements  Allergies  Allergen Reactions  . Chlorhexidine     Patient Measurements: Height: 5\' 6"  (167.6 cm) Weight: 225 lb 1.4 oz (102.1 kg) IBW/kg (Calculated) : 59.3 Heparin Dosing Weight: 82 kg  Vital Signs: Temp: 98.3 F (36.8 C) (12/26 0843) Temp Source: Oral (12/26 0843) BP: 113/60 (12/26 0843) Pulse Rate: 71 (12/26 0843)  Labs: Recent Labs    01/03/19 0416 01/03/19 2033 01/04/19 [id number] 01/04/19 1221 01/05/19 0518 01/05/19 1006  HGB 9.0* 8.2* 8.6*  8.5*  --  8.2*  --   HCT 29.3* 26.9* 28.8*  28.3*  --  27.2*  --   PLT 161 146* 150  150  --  176  --   LABPROT 15.1  --  14.9  --  13.9  --   INR 1.2  --  1.2  --  1.1  --   HEPARINUNFRC  --   --  0.52 0.66 0.33  --   CREATININE 1.96* 2.11* 1.42* 1.39*  --  1.10*    Estimated Creatinine Clearance: 62.3 mL/min (A) (by C-G formula based on SCr of 1.1 mg/dL (H)).   Medical History: Past Medical History:  Diagnosis Date  . Abnormal chest CT   . Aortic atherosclerosis ([place])   . Asthma   . Atrial enlargement, left    severe  . Atrial fibrillation ([place]) 01/05/2011   Chronic persistent, failed DCCV   . Atrial flutter ([place])   . Bronchospasm 1998  . Cardiomegaly   . Carotid bruit   . Chronic diastolic congestive heart failure ([place])   . Diabetes mellitus    insulin dependent  . Ejection fraction < 50%    35-40%,   . Heart murmur   . History of blood transfusion   . Hypercholesterolemia   . Hypertension   . Junctional rhythm 09/14/2018   Noted on EKG  . Left bundle branch block (LBBB) 09/14/2018   Noted on EKG  . LVH (left ventricular hypertrophy) 10/10/2016   Moderate, Noted on ECHO  . Mitral regurgitation   . Obesity   . Obesity (BMI 30-39.9) 01/16/2012  . Persistent atrial fibrillation ([place])   . Polyp of rectum   . Rheumatic fever 01/16/2012   Reported during childhood  . S/P Maze operation for atrial fibrillation 02/15/2012   Complete biatrial lesion set using bipolar radiofrequency and cryothermy ablation with clipping of LA appendage  . S/P mitral valve replacement with metallic valve [id number]   24mm Sorin [employer]omed[employer] Optiform mechanical prosthesis  . 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 Shortness of breath    with exertion  . Sleep apnea    DOES NOT HAVE CPAP  . Stroke [facility]) 2014   Post op , left arm weakness  . Tricuspid regurgitation 01/16/2012  . Uterine cancer (HCC)     Medications:  Pt on warfarin PTA for afib, mechanical valves (mitral, tricuspid).   Assessment: [name] on chronic warfarin PTA for afib, mechanical valves. Pt admitted for planned surgery, initial plan for therapeutic LMWH bridging with warfarin post-op. Given elevated SCr on 12/24, anticoagulation transitioned to heparin drip - pharmacy to dose. MD consulted pharmacy to resume warfarin dosing on 12/25 PM.   Home dose: Warfarin 6 mg on MWF, 4 mg on all other days of the week  Today, 01/05/19  HL = 0.33 is therapeutic on heparin infusion  of 850 units/hr, however has trended down to lower end of therapeutic range  Confirmed with RN that heparin infusing at correct rate. No signs/symptoms of bleeding.  INR 1.1 remains subtherapeutic as expected after being held for surgery  CBC: Hgb low but stable, Plt WNL & stable  SCr 1.1, CrCl 62 mL/min. Improved.  No new significant drug interactions with warfarin, pt taking amiodarone PTA.  Per pharmacist note from 12/18, plan to resume home warfarin dose post-op. INR check scheduled for 12/28.   Goal of Therapy:  INR 2.5 - 3.5 Heparin level 0.3-0.7 units/ml Monitor platelets by anticoagulation protocol: Yes   Plan:   Slightly increase heparin infusion to 900 units/hr  Check HL in 6 hours  CBC and HL daily while on heparin infusion  Monitor for signs/symptoms  of bleeding  Per MD note, once AKI resolves, planning to transition to LMWH bridge therapy with warfarin.   Once stable for discharge, recommend the following: -Continue home warfarin dose (6 mg MWF, 4 mg all other days of the week) -Enoxaparin 1 mg/kg (100 mg) subQ q12h bridge therapy -INR check on 12/28 at anticoagulation clinic to guide further dosing  Lenis Noon, PharmD 01/05/2019,10:43 AM

## 2019-01-05 NOTE — Progress Notes (Signed)
Pt will leave today with her daughter. Discharge instructions/prescriptions given/explained with pt and her daughter verbalizing understanding. Lovenox instructions and Coumadin given. Pt and her daughter aware of followup appointments.

## 2019-01-05 NOTE — Discharge Instructions (Signed)
01/02/2019  Start lovenox tonight, 12/25, and continue it twice a day until instructed by your cardiologists office to stop. Also continuing taking your Coumadin each night. You will come in to have your INR checked on 12/28.  You did not receive amlodipine, irbesartan or metformin while you were in the hospital. You can restart the metformin at home. I would hold off on taking the amlodipine or irbesartan again until you come in on the 28th and have your blood pressure checked. Also, wait to restart your potassium until you come in for your INR check.  Activity: 1. Be up and out of the bed during the day.  Take a nap if needed.  You may walk up steps but be careful and use the hand rail.  Stair climbing will tire you more than you think, you may need to stop part way and rest.   2. No lifting or straining for 6 weeks.  3. No driving for 1-2 weeks.  Do Not drive if you are taking narcotic pain medicine.  4. Shower daily.  Use soap and water on your incision and pat dry; don't rub. No tub baths for 6 weeks.  5. No sexual activity and nothing in the vagina for 8 weeks.  Medications:  - Take ibuprofen and tylenol first line for pain control. Take these regularly (every 6 hours) to decrease the build up of pain.  - If necessary, for severe pain not relieved by ibuprofen, take percocet.  - While taking percocet you should take sennakot every night to reduce the likelihood of constipation. If this causes diarrhea, stop its use.  Diet: 1. Low sodium Heart Healthy Diet is recommended.  2. It is safe to use a laxative if you have difficulty moving your bowels.   Wound Care: 1. Keep clean and dry.  Shower daily.  Reasons to call the Doctor:   Fever - Oral temperature greater than 100.4 degrees Fahrenheit  Foul-smelling vaginal discharge  Difficulty urinating  Nausea and vomiting  Increased pain at the site of the incision that is unrelieved with pain medicine.  Difficulty breathing  with or without chest pain  New calf pain especially if only on one side  Sudden, continuing increased vaginal bleeding with or without clots.   Follow-up: 1. See Jeral Pinch in 3 weeks. Phone visit in 1 week to review pathology from surgery.  Contacts: For questions or concerns you should contact:  Dr. Jeral Pinch at [phone] After hours and on week-ends call [phone] and ask to speak to the physician on call for Gynecologic Oncology

## 2019-01-07 ENCOUNTER — Other Ambulatory Visit: Payer: Self-pay

## 2019-01-07 ENCOUNTER — Ambulatory Visit (INDEPENDENT_AMBULATORY_CARE_PROVIDER_SITE_OTHER): Payer: Medicare HMO | Admitting: *Deleted

## 2019-01-07 ENCOUNTER — Telehealth: Payer: Self-pay

## 2019-01-07 DIAGNOSIS — I059 Rheumatic mitral valve disease, unspecified: Secondary | ICD-10-CM | POA: Diagnosis not present

## 2019-01-07 DIAGNOSIS — Z5181 Encounter for therapeutic drug level monitoring: Secondary | ICD-10-CM

## 2019-01-07 LAB — POCT INR: INR: 1.1 — AB (ref 2.0–3.0)

## 2019-01-07 NOTE — Patient Instructions (Signed)
Description   Continue lovenox injections. Take 2 tablets of warfarin today and 1.5 tablets tomorrow, then continue taking 1 tablet daily except 1.5 tablets on Mondays,  Wednesdays, and Fridays. Recheck INR on 12/31. Call us with any medication changes or concerns to Coumadin Clinic @ [phone].

## 2019-01-07 NOTE — Telephone Encounter (Addendum)
Pt states that she is doing well. Abdomen sore. Pain level is 3/10.  Pt using Tylenol as needed for pain.  She is eating, drinking,and urinating well. Incisions are D&I. She is getting up and moving around. She is aware of appointment for phone visit with Dr. Berline Lopes tomorrow. She was seen at coumadin clinic and given instructions for Lovenox injections and coumadin tables with Repeat labs 01-10-19

## 2019-01-07 NOTE — Progress Notes (Signed)
Pt stated that she did not take any warfarin or Lovenox on Sunday 12/27. Informed her to continue to take lovenox twice a day and to continue to take her warfarin as instructed.

## 2019-01-08 ENCOUNTER — Inpatient Hospital Stay (HOSPITAL_BASED_OUTPATIENT_CLINIC_OR_DEPARTMENT_OTHER): Payer: Medicare HMO | Admitting: Gynecologic Oncology

## 2019-01-08 ENCOUNTER — Ambulatory Visit: Payer: Medicare HMO | Admitting: Gynecologic Oncology

## 2019-01-08 ENCOUNTER — Encounter: Payer: Self-pay | Admitting: Gynecologic Oncology

## 2019-01-08 DIAGNOSIS — C541 Malignant neoplasm of endometrium: Secondary | ICD-10-CM

## 2019-01-08 NOTE — Progress Notes (Signed)
Gynecologic Oncology Telehealth Consult Note: Gyn-Onc  I connected with [name] on 01/08/19 at  3:00 PM EST by telephone and verified that I am speaking with the correct person using two identifiers.  I discussed the limitations, risks, security and privacy concerns of performing an evaluation and management service by telemedicine and the availability of in-person appointments. I also discussed with the patient that there may be a patient responsible charge related to this service. The patient expressed understanding and agreed to proceed.  Other persons participating in the visit and their role in the encounter: none.  Patient's location: Home Provider's location: [facility]  Chief Complaint: Postop, discussion regarding final pathology  Treatment History: Oncology History Overview Note  Presented with PMB   Endometrial cancer ([place])  11/20/2018 Initial Biopsy   EMB 11/10: G3 carcinoma with clear cell features.  Pap 11/10: AGC    11/20/2018 Initial Diagnosis   Endometrial cancer ([place])   12/03/2018 Surgery   TRH/BSO, bil SLNs   12/03/2018 Pathologic Stage   Stage II Gr3 (clear cell features), no LVSI, cervial stroma involved, <50% MI  A. UTERUS, CERVIX, BILATERAL FALLOPIAN TUBES, OVARIES, RESECTION:  - Uterus:       Endomyometrium: Poorly differentiated carcinoma with clear cell  features,  spanning 3.5 cm, see comment.            Tumor limited to upper half of myometrium.            Leiomyoma.            See oncology table.       Serosa: Fibrous adhesions with endosalpingiosis. No malignancy.  - Cervix: Stroma involved by tumor.  - Bilateral ovaries: Inclusion cysts. No malignancy.  - Bilateral fallopian tubes: Unremarkable. No malignancy.   B. LYMPH NODE, RIGHT OBTURATOR, SENTINEL, BIOPSY:  - One of one lymph nodes negative for carcinoma (0/1).   C. LYMPH NODE, LEFT EXTERNAL ILIAC, SENTINEL, BIOPSY:  - One of one lymph nodes negative for carcinoma  (0/1).   D. LYMPH NODE, LEFT EXTERNAL ILIAC, BIOPSY:  - One of one lymph nodes negative for carcinoma (0/1).   ONCOLOGY TABLE:   UTERUS, CARCINOMA OR CARCINOSARCOMA   Procedure: Total hysterectomy and bilateral salpingo-oophorectomy  Histologic type: Poorly differentiated carcinoma with clear cell  features, see comment.  Histologic Grade: High-grade  Myometrial invasion:       Depth of invasion:9 mm       Myometrial thickness: 20 mm  Uterine Serosa Involvement: Not identified  Cervical stromal involvement: Present  Extent of involvement of other organs: Not identified  Lymphovascular invasion: Not identified  Regional Lymph Nodes:       Examined:     3 Sentinel                               0 non-sentinel                               3 total        Lymph nodes with metastasis: 0        Isolated tumor cells (<0.2 mm): 0        Micrometastasis:  (>0.2 mm and < 2.0 mm): 0        Macrometastasis: (>2.0 mm): 0        Extracapsular extension: Not applicable  Representative Tumor Block: A5  MMR /  MSI testing: Will be ordered  Pathologic Stage Classification (pTNM, AJCC 8th edition):  pT2, pNX  Comments: The tumor consists of solid sheets and more tubulocystic areas  with focal areas of cytoplasmic clearing. Immunohistochemistry is  positive for racemase (weak), Napsin-A (focal), and p53 (wild type). ER  and PR are negative. While extensive clear cell changes are not seen in  the more solid components, the immunoprofile along with more definitive  areas of clear cell changes favor the overall tumor to be a clear cell  carcinoma. The tumor has a more pushing type of invasion. There is  stromal invasion in areas of endocervical mucosa. Immunohistochemistry  on the serosa adhesions reveals mesothelial cells (calretinin) and a  small focus of endosalpingiosis (PAX-8, ER, MOC31). Pancytokeratin on  the lymph nodes is negative.    12/05/2018 Imaging   CT C/A/P:  1. Low attenuation  mass or fluid in the endometrial cavity (series 2, image 110), in keeping with known endometrial malignancy. 2. Numerous small bilateral ground-glass pulmonary nodules. Although nonspecific these are likely infectious or inflammatory given appearance, isolated manifestation of metastatic disease much less favored. 3. Enlarged mediastinal and hilar lymph nodes, likely reactive given pulmonary findings. 4. No evidence of metastatic disease in the abdomen or pelvis. 5. Nonobstructive right nephrolithiasis. 6. Aortic Atherosclerosis (ICD10-I70.0).     Interval History: The patient is status post staging surgery for high-grade endometrial cancer last week.  Her surgery was uncomplicated however she required a multiday stay in the hospital area to an AKI, presumed to be from her Lovenox bridge preoperatively as well as some relative low blood pressures in the perioperative period.  The patient ultimately was discharged with a creatinine of 1.1, just above her baseline.  She was meeting postoperative milestones on postoperative day #1, but secondary to her AKI was started on a heparin drip until her creatinine had improved enough to transition back to therapeutic Lovenox in the setting of her mechanical heart valves and A. fib.  He continues to do well at home and reports minimal pain which she uses Tylenol as needed.  She is tolerating p.o. intake without nausea or emesis.  She reports regular bowel and bladder function.  She denies any significant vaginal bleeding or discharge.  Past Medical/Surgical History: Past Medical History:  Diagnosis Date  . Abnormal chest CT   . Aortic atherosclerosis (HCC)   . Asthma   . Atrial enlargement, left    severe  . Atrial fibrillation (HCC) 01/05/2011   Chronic persistent, failed DCCV   . Atrial flutter (HCC)   . Bronchospasm 1998  . Cardiomegaly   . Carotid bruit   . Chronic diastolic congestive heart failure (HCC)   . Diabetes mellitus    insulin  dependent  . Ejection fraction < 50%    35-40%,   . Heart murmur   . History of blood transfusion   . Hypercholesterolemia   . Hypertension   . Junctional rhythm 09/14/2018   Noted on EKG  . Left bundle branch block (LBBB) 09/14/2018   Noted on EKG  . LVH (left ventricular hypertrophy) 10/10/2016   Moderate, Noted on ECHO  . Mitral regurgitation   . Obesity   . Obesity (BMI 30-39.9) 01/16/2012  . Persistent atrial fibrillation (HCC)   . Polyp of rectum   . Rheumatic fever 01/16/2012   Reported during childhood  . S/P Maze operation for atrial fibrillation 02/15/2012   Complete biatrial lesion set using bipolar radiofrequency and cryothermy ablation with clipping   of LA appendage  . S/P mitral valve replacement with metallic valve 01/15/1094   69m Sorin [employer] Optiform mechanical prosthesis  . 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 Shortness of breath    with exertion  . Sleep apnea    DOES NOT HAVE CPAP  [facility]   Post op , left arm weakness  . Tricuspid regurgitation 01/16/2012  . [facility]    Past Surgical History:  Procedure Laterality Date  . CARDIAC CATHETERIZATION  >5 years  . CARDIOVASCULAR STRESS TEST  09/2010  . CARDIOVERSION  03/25/2011   Procedure: CARDIOVERSION;  Surgeon: JaJettie BoozeMD;  [facility]: Cardiovascular;  Laterality: N/A;  . CARDIOVERSION N/A 07/27/2012   Procedure: CARDIOVERSION;  Surgeon: JaJettie BoozeMD;  [place] [facility] Service: Cardiovascular;  Laterality: N/A;  . CESAREAN SECTION    . COLONOSCOPY WITH PROPOFOL N/A 04/27/2016   Procedure: COLONOSCOPY WITH PROPOFOL;  Surgeon: ViWilford CornerMD;  [facility];  Service: Endoscopy;  Laterality: N/A;  . INTRAOPERATIVE TRANSESOPHAGEAL ECHOCARDIOGRAM  02/15/2012   Procedure: INTRAOPERATIVE TRANSESOPHAGEAL ECHOCARDIOGRAM;  Surgeon: ClRexene AlbertsMD;  [facility] Service: Open Heart  Surgery;  Laterality: N/A;  . MAZE  02/15/2012   Procedure: MAZE;  Surgeon: ClRexene AlbertsMD;  [place] Service: Open Heart Surgery;  Laterality: N/A;  . MITRAL VALVE REPLACEMENT  02/15/2012   Procedure: MITRAL VALVE (MV) REPLACEMENT;  Surgeon: ClRexene AlbertsMD;  [place] [facility] Service: Open Heart Surgery;  Laterality: N/A;  . ROBOTIC ASSISTED LAPAROSCOPIC HYSTERECTOMY AND SALPINGECTOMY Bilateral 01/02/2019   Procedure: XI ROBOTIC ASSISTED LAPAROSCOPIC TOTAL HYSTERECTOMY WITH BILATERAL SALPINGOOPHORECTOMY, SENTINEL LYMPH NODE BIOPSY;  Surgeon: TuLafonda MossesMD;  Location: [facility];  Service: Gynecology;  Laterality: Bilateral;  . TEE WITHOUT CARDIOVERSION  12/21/2011   Procedure: TRANSESOPHAGEAL ECHOCARDIOGRAM (TEE);  Surgeon: JaJettie BoozeMD;  [facility];  Service: Cardiovascular;  Laterality: N/A;  . TRICUSPID VALVE REPLACEMENT  02/15/2012   Procedure: TRICUSPID VALVE REPAIR;  Surgeon: ClRexene AlbertsMD;  [place] Service: Open Heart Surgery;  Laterality: N/A;  . TUBAL LIGATION     at time of her c-section    Family History  Problem Relation Age of Onset  . Asthma Mother   . Diabetes Mother   . Hypertension Mother   . Hypertension Brother   . Heart attack Neg Hx   . Stroke Neg Hx   . Colon cancer Neg Hx   . Colon polyps Neg Hx   . Liver disease Neg Hx   . Uterine cancer Neg Hx   . Ovarian cancer Neg Hx   . Breast cancer Neg Hx     Social History   Socioeconomic History  . Marital status: Married    Spouse name: Not on file  . Number of children: Not on file  . Years of education: Not on file  . Highest education level: Not on file  Occupational History  . Occupation: [employer]  Tobacco Use  . Smoking status: Never Smoker  . Smokeless tobacco: Never Used  Substance and Sexual Activity  . Alcohol use: No  . Drug use: No  . Sexual activity: Yes    Birth control/protection:  Post-menopausal  Other Topics Concern  . Not on file  Social History Narrative   Pt lives  in [place] with spouse.  Works at [employer]. 2 grown children, 1 grandchild   Social Determinants of Health   Financial Resource Strain:   . Difficulty of Paying Living Expenses: Not on file  Food Insecurity:   . Worried About Running Out of Food in the Last Year: Not on file  . Ran Out of Food in the Last Year: Not on file  Transportation Needs:   . Lack of Transportation (Medical): Not on file  . Lack of Transportation (Non-Medical): Not on file  Physical Activity:   . Days of Exercise per Week: Not on file  . Minutes of Exercise per Session: Not on file  Stress:   . Feeling of Stress : Not on file  Social Connections:   . Frequency of Communication with Friends and Family: Not on file  . Frequency of Social Gatherings with Friends and Family: Not on file  . Attends Religious Services: Not on file  . Active Member of Clubs or Organizations: Not on file  . Attends Club or Organization Meetings: Not on file  . Marital Status: Not on file    Current Medications:  Current Outpatient Medications:  .  acetaminophen (TYLENOL) 500 MG tablet, Take 2 tablets (1,000 mg total) by mouth every 6 (six) hours for 20 doses., Disp: 40 tablet, Rfl: 0 .  amiodarone (PACERONE) 200 MG tablet, TAKE 1 TABLET(200 MG) BY MOUTH DAILY (Patient taking differently: Take 200 mg by mouth daily. ), Disp: 90 tablet, Rfl: 3 .  atorvastatin (LIPITOR) 40 MG tablet, Take 1 tablet (40 mg total) by mouth daily., Disp: 90 tablet, Rfl: 3 .  calcium carbonate (OS-CAL - DOSED IN MG OF ELEMENTAL CALCIUM) 1250 MG tablet, Take 1 tablet by mouth daily.  , Disp: , Rfl:  .  carvedilol (COREG) 12.5 MG tablet, Take 1 tablet (12.5 mg total) by mouth 2 (two) times daily with a meal., Disp: 180 tablet, Rfl: 3 .  Cholecalciferol (VITAMIN D) 1000 UNITS capsule, Take 1,000 Units by mouth daily. , Disp: , Rfl:  .   cyclobenzaprine (FLEXERIL) 10 MG tablet, Take 10 mg by mouth daily. , Disp: , Rfl:  .  enoxaparin (LOVENOX) 100 MG/ML injection, Inject 1 mL (100 mg total) into the skin every 12 (twelve) hours., Disp: 10 mL, Rfl: 0 .  insulin glargine (LANTUS SOLOSTAR) 100 UNIT/ML injection, Inject 30-35 Units into the skin See admin instructions. Inject 30 units into the skin in the morning and 35 units at bedtime., Disp: , Rfl:  .  metFORMIN (GLUCOPHAGE) 500 MG tablet, Take 500 mg by mouth 2 (two) times daily with a meal. , Disp: , Rfl:  .  methocarbamol (ROBAXIN) 500 MG tablet, Take 500 mg by mouth every 8 (eight) hours as needed for muscle spasms. , Disp: , Rfl:  .  senna (SENOKOT) 8.6 MG TABS tablet, Take 1 tablet (8.6 mg total) by mouth daily as needed for up to 15 doses for mild constipation., Disp: 15 tablet, Rfl: 0 .  warfarin (COUMADIN) 4 MG tablet, Take 1 to 1.5 tablets daily as directed by Coumadin Clinic, Disp: 135 tablet, Rfl: 1  Review of Symptoms: Pertinent positives as per HPI, otherwise review of systems negative.  Physical Exam: There were no vitals taken for this visit. Exam deferred lesions of phone visit  Laboratory & Radiologic Studies: Final pathology as noted in the treatment history above  Assessment & Plan: Jasiah W Lamping is a 63 y.o. 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  We discussed final pathology in detail.  Given high-grade carcinoma, confined to the uterus although invading the cervical stroma, we discussed my recommendation to proceed when she has recovered from surgery with systemic treatment in the form of platinum based chemotherapy.  Given the cervical stroma involvement without LVSI and with negative sentinel lymph nodes, I would favor the addition of vaginal brachytherapy rather than external beam.  The patient is aware that we will discuss her case at our next tumor conference, and I will update  her with this discussion.  She knows that I will reach out to Karen, our nurse navigator, to get her scheduled to see Dr. Gorsuch in the upcoming weeks.  I discussed the assessment and treatment plan with the patient. The patient was provided with an opportunity to ask questions and all were answered. The patient agreed with the plan and demonstrated an understanding of the instructions.   The patient was advised to call back or see an in-person evaluation if the symptoms worsen or if the condition fails to improve as anticipated.   I provided 12 minutes of non face-to-face telephone visit time during this encounter, and > 50% was spent counseling as documented under my assessment & plan.  Katherine Tucker, MD  Division of Gynecologic Oncology  Department of Obstetrics and Gynecology  [facility]   

## 2019-01-09 ENCOUNTER — Telehealth: Payer: Self-pay | Admitting: Oncology

## 2019-01-09 DIAGNOSIS — C541 Malignant neoplasm of endometrium: Secondary | ICD-10-CM

## 2019-01-09 NOTE — Telephone Encounter (Signed)
Called patient's daughter, Garvin Fila, and scheduled appointment to see Dr. Jacklynn Lewis on 01/18/2019 at 11:45.  She verbalized agreement of appointment date and time.

## 2019-01-10 ENCOUNTER — Other Ambulatory Visit: Payer: Self-pay | Admitting: Oncology

## 2019-01-10 ENCOUNTER — Ambulatory Visit (INDEPENDENT_AMBULATORY_CARE_PROVIDER_SITE_OTHER): Payer: Medicare HMO | Admitting: *Deleted

## 2019-01-10 ENCOUNTER — Other Ambulatory Visit: Payer: Self-pay

## 2019-01-10 VITALS — BP 150/82

## 2019-01-10 DIAGNOSIS — I059 Rheumatic mitral valve disease, unspecified: Secondary | ICD-10-CM | POA: Diagnosis not present

## 2019-01-10 DIAGNOSIS — Z5181 Encounter for therapeutic drug level monitoring: Secondary | ICD-10-CM

## 2019-01-10 DIAGNOSIS — I10 Essential (primary) hypertension: Secondary | ICD-10-CM

## 2019-01-10 DIAGNOSIS — C541 Malignant neoplasm of endometrium: Secondary | ICD-10-CM

## 2019-01-10 LAB — BASIC METABOLIC PANEL
BUN/Creatinine Ratio: 15 (ref 12–28)
BUN: 12 mg/dL (ref 8–27)
CO2: 20 mmol/L (ref 20–29)
Calcium: 8.9 mg/dL (ref 8.7–10.3)
Chloride: 107 mmol/L — ABNORMAL HIGH (ref 96–106)
Creatinine, Ser: 0.81 mg/dL (ref 0.57–1.00)
GFR calc Af Amer: 89 mL/min/{1.73_m2} (ref >59)
GFR calc non Af Amer: 77 mL/min/{1.73_m2} (ref >59)
Glucose: 72 mg/dL (ref 65–99)
Potassium: 3.9 mmol/L (ref 3.5–5.2)
Sodium: 140 mmol/L (ref 134–144)

## 2019-01-10 LAB — POCT INR: INR: 1.4 — AB (ref 2.0–3.0)

## 2019-01-10 MED ORDER — ENOXAPARIN SODIUM 100 MG/ML ~~LOC~~ SOLN: 100.0000 mg | Freq: Two times a day (BID) | SUBCUTANEOUS | 0 refills | Status: DC

## 2019-01-10 NOTE — Patient Instructions (Signed)
Continue lovenox injections. Take 1.5 tablets of warfarin today and 2 tablets tomorrow, then continue taking 1 tablet daily except 1.5 tablets on Mondays,  Wednesdays, and Fridays. Recheck INR on 1/4. Call us with any medication changes or concerns to Coumadin Clinic @ [phone].

## 2019-01-14 ENCOUNTER — Ambulatory Visit (INDEPENDENT_AMBULATORY_CARE_PROVIDER_SITE_OTHER): Payer: Medicare Other | Admitting: Pharmacist

## 2019-01-14 ENCOUNTER — Other Ambulatory Visit: Payer: Self-pay

## 2019-01-14 DIAGNOSIS — Z5181 Encounter for therapeutic drug level monitoring: Secondary | ICD-10-CM

## 2019-01-14 DIAGNOSIS — I059 Rheumatic mitral valve disease, unspecified: Secondary | ICD-10-CM

## 2019-01-14 LAB — POCT INR: INR: 1.8 — AB (ref 2.0–3.0)

## 2019-01-14 NOTE — Patient Instructions (Signed)
Description   Continue lovenox injections. Take 2 tablets of warfarin today and 1.5 tablets tomorrow, then continue taking 1 tablet daily except 1.5 tablets on Mondays,  Wednesdays, and Fridays. Recheck INR on 1/8. Call us with any medication changes or concerns to Coumadin Clinic @ [phone].

## 2019-01-17 ENCOUNTER — Other Ambulatory Visit: Payer: Self-pay

## 2019-01-17 ENCOUNTER — Ambulatory Visit (INDEPENDENT_AMBULATORY_CARE_PROVIDER_SITE_OTHER): Payer: Medicare Other | Admitting: *Deleted

## 2019-01-17 DIAGNOSIS — Z5181 Encounter for therapeutic drug level monitoring: Secondary | ICD-10-CM

## 2019-01-17 DIAGNOSIS — I059 Rheumatic mitral valve disease, unspecified: Secondary | ICD-10-CM

## 2019-01-17 LAB — POCT INR: INR: 2.7 (ref 2.0–3.0)

## 2019-01-17 NOTE — Patient Instructions (Signed)
Description   Continue taking 1 tablet daily except 1.5 tablets on Mondays,  Wednesdays, and Fridays. Recheck INR in 2 weeks. Call us with any medication changes or concerns to Coumadin Clinic @ [phone].

## 2019-01-17 NOTE — Progress Notes (Signed)
Gynecologic Oncology Return Clinic Visit  01/22/19   Reason for Visit: post-operative visit and further discussion regarding adjuvant treatment  Treatment History: Oncology History Overview Note  Clear cell features MSI stable   Endometrial cancer ([place])  09/24/2018 Initial Diagnosis   She presented with post menopausal bleeding   11/20/2018 Initial Biopsy   EMB 11/10: G3 carcinoma with clear cell features.  Pap 11/10: AGC    12/03/2018 Surgery   TRH/BSO, bil SLNs   12/05/2018 Imaging   CT C/A/P:  1. Low attenuation mass or fluid in the endometrial cavity (series 2, image 110), in keeping with known endometrial malignancy. 2. Numerous small bilateral ground-glass pulmonary nodules. Although nonspecific these are likely infectious or inflammatory given appearance, isolated manifestation of metastatic disease much less favored. 3. Enlarged mediastinal and hilar lymph nodes, likely reactive given pulmonary findings. 4. No evidence of metastatic disease in the abdomen or pelvis. 5. Nonobstructive right nephrolithiasis. 6. Aortic Atherosclerosis (ICD10-I70.0).   01/02/2019 Pathologic Stage   Stage II Gr3 (clear cell features), no LVSI, cervial stroma involved, <50% MI  A. 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            Tumor limited to upper half of myometrium.            Leiomyoma.            See oncology table.       Serosa: Fibrous adhesions with endosalpingiosis. No malignancy.  - Cervix: Stroma involved by tumor.  - Bilateral ovaries: Inclusion cysts. No malignancy.  - Bilateral fallopian tubes: Unremarkable. No malignancy.   B. LYMPH NODE, RIGHT OBTURATOR, SENTINEL, BIOPSY:  - One of one lymph nodes negative for carcinoma (0/1).   C. LYMPH NODE, LEFT EXTERNAL ILIAC, SENTINEL, BIOPSY:  - One of one lymph nodes negative for  carcinoma (0/1).   D. LYMPH NODE, LEFT EXTERNAL ILIAC, BIOPSY:  - One of one lymph nodes negative for carcinoma (0/1).   ONCOLOGY TABLE:   UTERUS, CARCINOMA OR CARCINOSARCOMA   Procedure: Total hysterectomy and bilateral salpingo-oophorectomy  Histologic type: Poorly differentiated carcinoma with clear cell  features, see comment.  Histologic Grade: High-grade  Myometrial invasion:       Depth of invasion:9 mm       Myometrial thickness: 20 mm  Uterine Serosa Involvement: Not identified  Cervical stromal involvement: Present  Extent of involvement of other organs: Not identified  Lymphovascular invasion: Not identified  Regional Lymph Nodes:       Examined:     3 Sentinel                               0 non-sentinel                               3 total        Lymph nodes with metastasis: 0        Isolated tumor cells (<0.2 mm): 0        Micrometastasis:  (>0.2 mm and < 2.0 mm): 0        Macrometastasis: (>2.0 mm): 0        Extracapsular extension: Not applicable  Representative Tumor Block: A5  MMR / MSI testing: Will be ordered  Pathologic Stage  Classification (pTNM, AJCC 8th edition):  pT2, pNX  Comments: The tumor consists of solid sheets and more tubulocystic areas with focal areas of cytoplasmic clearing. Immunohistochemistry is positive for racemase (weak), Napsin-A (focal), and p53 (wild type). ER and PR are negative. While extensive clear cell changes are not seen in the more solid components, the immunoprofile along with more definitive areas of clear cell changes favor the overall tumor to be a clear cell carcinoma. The tumor has a more pushing type of invasion. There is stromal invasion in areas of endocervical mucosa. Immunohistochemistry on the serosa adhesions reveals mesothelial cells (calretinin) and a small focus of endosalpingiosis (PAX-8, ER, MOC31). Pancytokeratin on the lymph nodes is negative.    01/18/2019 Cancer Staging   Staging form: Corpus Uteri -  Carcinoma and Carcinosarcoma, AJCC 8th Edition - Pathologic: Stage II (pT2, pN0, cM0) - Signed by Heath Lark, MD on 01/18/2019   02/04/2019 -  Chemotherapy   The patient had palonosetron (ALOXI) injection 0.25 mg, 0.25 mg, Intravenous,  Once, 0 of 6 cycles CARBOplatin (PARAPLATIN) 750 mg in sodium chloride 0.9 % 250 mL chemo infusion, 750 mg (100 % of original dose 750 mg), Intravenous,  Once, 0 of 6 cycles Dose modification: 750 mg (original dose 750 mg, Cycle 1, Reason: Provider Judgment) fosaprepitant (EMEND) 150 mg in sodium chloride 0.9 % 145 mL IVPB, 150 mg, Intravenous,  Once, 0 of 6 cycles PACLitaxel (TAXOL) 312 mg in sodium chloride 0.9 % 500 mL chemo infusion (> 39m/m2), 140 mg/m2 = 312 mg (80 % of original dose 175 mg/m2), Intravenous,  Once, 0 of 6 cycles Dose modification: 140 mg/m2 (80 % of original dose 175 mg/m2, Cycle 1, Reason: Provider Judgment)  for chemotherapy treatment.      Interval History: Doing very well after surgery.  Denies any vaginal bleeding or discharge.  She reports her appetite has been normal and she denies any nausea or emesis.  She reports daily soft bowel movements and is off Senokot now.  She endorses normal bladder function.  She denies any fevers or chills.  Patient has been following with her cardiologist and is now back on warfarin with INR in goal range.  Past Medical/Surgical History: Past Medical History:  Diagnosis Date  . Abnormal chest CT   . Aortic atherosclerosis ([place]   . Asthma   . Atrial enlargement, left    severe  . Atrial fibrillation ([place] 01/05/2011   Chronic persistent, failed DCCV   . Atrial flutter ([place]   . Bronchospasm 1998  . Cardiomegaly   . Carotid bruit   . Chronic diastolic congestive heart failure ([place]   . Diabetes mellitus    insulin dependent  . Ejection fraction < 50%    35-40%,   . Heart murmur   . History of blood transfusion   . Hypercholesterolemia   . Hypertension   . Junctional rhythm 09/14/2018    Noted on EKG  . Left bundle branch block (LBBB) 09/14/2018   Noted on EKG  . LVH (left ventricular hypertrophy) 10/10/2016   Moderate, Noted on ECHO  . Mitral regurgitation   . Obesity   . Obesity (BMI 30-39.9) 01/16/2012  . Persistent atrial fibrillation ([place]   . Polyp of rectum   . Rheumatic fever 01/16/2012   Reported during childhood  . S/P Maze operation for atrial fibrillation 02/15/2012   Complete biatrial lesion set using bipolar radiofrequency and cryothermy ablation with clipping of LA appendage  . S/P mitral valve replacement with metallic  valve 02/15/2012   43m Sorin [employer] Optiform mechanical prosthesis  . 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 Shortness of breath    with exertion  . Sleep apnea    DOES NOT HAVE CPAP  . [facility]   Post op , left arm weakness  . Tricuspid regurgitation 01/16/2012  . [facility]    Past Surgical History:  Procedure Laterality Date  . CARDIAC CATHETERIZATION  >5 years  . CARDIOVASCULAR STRESS TEST  09/2010  . CARDIOVERSION  03/25/2011   Procedure: CARDIOVERSION;  Surgeon: JaJettie BoozeMD;  [facility] Service: Cardiovascular;  Laterality: N/A;  . CARDIOVERSION N/A 07/27/2012   Procedure: CARDIOVERSION;  Surgeon: JaJettie BoozeMD;  [facility] Service: Cardiovascular;  Laterality: N/A;  . CESAREAN SECTION    . COLONOSCOPY WITH PROPOFOL N/A 04/27/2016   Procedure: COLONOSCOPY WITH PROPOFOL;  Surgeon: ViWilford CornerMD;  [facility];  Service: Endoscopy;  Laterality: N/A;  . INTRAOPERATIVE TRANSESOPHAGEAL ECHOCARDIOGRAM  02/15/2012   Procedure: INTRAOPERATIVE TRANSESOPHAGEAL ECHOCARDIOGRAM;  Surgeon: ClRexene AlbertsMD;  [facility] Service: Open Heart Surgery;  Laterality: N/A;  . MAZE  02/15/2012   Procedure: MAZE;  Surgeon: ClRexene AlbertsMD;  [place] [facility] Service: Open Heart Surgery;  Laterality: N/A;  . MITRAL VALVE  REPLACEMENT  02/15/2012   Procedure: MITRAL VALVE (MV) REPLACEMENT;  Surgeon: ClRexene AlbertsMD;  [place] Service: Open Heart Surgery;  Laterality: N/A;  . ROBOTIC ASSISTED LAPAROSCOPIC HYSTERECTOMY AND SALPINGECTOMY Bilateral 01/02/2019   Procedure: XI ROBOTIC ASSISTED LAPAROSCOPIC TOTAL HYSTERECTOMY WITH BILATERAL SALPINGOOPHORECTOMY, SENTINEL LYMPH NODE BIOPSY;  Surgeon: TuLafonda MossesMD;  Location[facility];  Service: Gynecology;  Laterality: Bilateral;  . TEE WITHOUT CARDIOVERSION  12/21/2011   Procedure: TRANSESOPHAGEAL ECHOCARDIOGRAM (TEE);  Surgeon: JaJettie BoozeMD;  [facility];  Service: Cardiovascular;  Laterality: N/A;  . TRICUSPID VALVE REPLACEMENT  02/15/2012   Procedure: TRICUSPID VALVE REPAIR;  Surgeon: ClRexene AlbertsMD;  [place] Service: Open Heart Surgery;  Laterality: N/A;  . TUBAL LIGATION     at time of her c-section    Family History  Problem Relation Age of Onset  . Asthma Mother   . Diabetes Mother   . Hypertension Mother   . Hypertension Brother   . Heart attack Neg Hx   . Stroke Neg Hx   . Colon cancer Neg Hx   . Colon polyps Neg Hx   . Liver disease Neg Hx   . Uterine cancer Neg Hx   . Ovarian cancer Neg Hx   . Breast cancer Neg Hx     Social History   Socioeconomic History  . Marital status: Married    Spouse name: Not on file  . Number of children: Not on file  . Years of education: Not on file  . Highest education level: Not on file  Occupational History  . Occupation: [employer]  Tobacco Use  . Smoking status: Never Smoker  . Smokeless tobacco: Never Used  Substance and Sexual Activity  . Alcohol use: No  . Drug use: No  . Sexual activity: Yes    Birth control/protection: Post-menopausal  Other Topics Concern  . Not on file  Social History Narrative   [place]  Works at [employer] grown  children, 1 grandchild    Social Determinants of Health   Financial Resource Strain:   . Difficulty of Paying Living Expenses: Not on file  Food Insecurity:   . Worried About Charity fundraiser in the Last Year: Not on file  . Ran Out of Food in the Last Year: Not on file  Transportation Needs:   . Lack of Transportation (Medical): Not on file  . Lack of Transportation (Non-Medical): Not on file  Physical Activity:   . Days of Exercise per Week: Not on file  . Minutes of Exercise per Session: Not on file  Stress:   . Feeling of Stress : Not on file  Social Connections:   . Frequency of Communication with Friends and Family: Not on file  . Frequency of Social Gatherings with Friends and Family: Not on file  . Attends Religious Services: Not on file  . Active Member of Clubs or Organizations: Not on file  . Attends Archivist Meetings: Not on file  . Marital Status: Not on file    Current Medications:  Current Outpatient Medications:  .  amiodarone (PACERONE) 200 MG tablet, TAKE 1 TABLET(200 MG) BY MOUTH DAILY (Patient taking differently: Take 200 mg by mouth daily. ), Disp: 90 tablet, Rfl: 3 .  amLODipine (NORVASC) 5 MG tablet, Take 1 tablet (5 mg total) by mouth daily., Disp: 180 tablet, Rfl: 3 .  atorvastatin (LIPITOR) 40 MG tablet, Take 1 tablet (40 mg total) by mouth daily., Disp: 90 tablet, Rfl: 3 .  calcium carbonate (OS-CAL - DOSED IN MG OF ELEMENTAL CALCIUM) 1250 MG tablet, Take 1 tablet by mouth daily.  , Disp: , Rfl:  .  carvedilol (COREG) 12.5 MG tablet, Take 1 tablet (12.5 mg total) by mouth 2 (two) times daily with a meal., Disp: 180 tablet, Rfl: 3 .  Cholecalciferol (VITAMIN D) 1000 UNITS capsule, Take 1,000 Units by mouth daily. , Disp: , Rfl:  .  cyclobenzaprine (FLEXERIL) 10 MG tablet, Take 10 mg by mouth daily. , Disp: , Rfl:  .  insulin glargine (LANTUS SOLOSTAR) 100 UNIT/ML injection, Inject 30-35 Units into the skin See admin instructions. Inject 30 units into the skin in  the morning and 35 units at bedtime., Disp: , Rfl:  .  irbesartan (AVAPRO) 300 MG tablet, Take 1 tablet (300 mg total) by mouth daily., Disp: 90 tablet, Rfl: 3 .  metFORMIN (GLUCOPHAGE) 500 MG tablet, Take 500 mg by mouth 2 (two) times daily with a meal. , Disp: , Rfl:  .  methocarbamol (ROBAXIN) 500 MG tablet, Take 500 mg by mouth every 8 (eight) hours as needed for muscle spasms. , Disp: , Rfl:  .  ondansetron (ZOFRAN) 8 MG tablet, Take 1 tablet (8 mg total) by mouth every 8 (eight) hours as needed., Disp: 30 tablet, Rfl: 1 .  prochlorperazine (COMPAZINE) 10 MG tablet, Take 1 tablet (10 mg total) by mouth every 6 (six) hours as needed (Nausea or vomiting)., Disp: 30 tablet, Rfl: 1 .  senna (SENOKOT) 8.6 MG TABS tablet, Take 1 tablet (8.6 mg total) by mouth daily as needed for up to 15 doses for mild constipation., Disp: 15 tablet, Rfl: 0 .  warfarin (COUMADIN) 4 MG tablet, Take 1 to 1.5 tablets daily as directed by [facility]mad[facility] Clinic, Disp: 135 tablet, Rfl: 1 .  dexamethasone (DECADRON) 4 MG tablet, Take 2 tabs at the night before chemotherapy, every 3 weeks, by mouth x 6 cycles, please dispense 12 tabs (Patient not  taking: Reported on 01/22/2019), Disp: 60 tablet, Rfl: 0  Review of Symptoms: Denies appetite changes, fevers, chills, fatigue, unexplained weight changes. Denies hearing loss, neck lumps or masses, mouth sores, ringing in ears or voice changes. Denies cough or wheezing.  Denies shortness of breath. Denies chest pain or palpitations. Denies leg swelling. Denies abdominal distention, pain, blood in stools, constipation, diarrhea, nausea, vomiting, or early satiety. Denies pain with intercourse, dysuria, frequency, hematuria or incontinence. Denies hot flashes, pelvic pain, vaginal bleeding or vaginal discharge.   Denies joint pain, back pain or muscle pain/cramps. Denies itching, rash, or wounds. Denies dizziness, headaches, numbness or seizures. Denies swollen lymph nodes or glands,  denies easy bruising or bleeding. Denies anxiety, depression, confusion, or decreased concentration.  Physical Exam: BP (!) 125/58 (BP Location: Left Arm, Patient Position: Sitting)   Pulse 64   Temp 98 F (36.7 C) (Temporal)   Resp 18   Ht '5\' 6"'  (1.676 m)   Wt 225 lb (102.1 kg)   SpO2 98%   BMI 36.32 kg/m  General: Alert, oriented, no acute distress. HEENT: Normocephalic, atraumatic, sclera anicteric. Chest: Clear to auscultation bilaterally. No wheezes or rhonchi.  Cardiovascular: Regular rate and rhythm, systolic murmur appreciated. Click appreciated.  Abdomen: Obese, soft, nontender.  Normoactive bowel sounds.  No masses or hepatosplenomegaly appreciated.  Well-healed trocar incisions with minimal amount of Dermabond still present.  There is some thickening of the tissue as well as mild erythema around the left upper quadrant incision although no evidence of fluctuance, exudate or induration.  Some bruising in lower abdomen consistent with recent Lovenox use. Extremities: Normal range of motion.  Warm, well perfused.  1+ edema bilaterally. Skin: No rashes or lesions noted. GU: Normal appearing external genitalia without erythema, excoriation, or lesions.  Speculum exam reveals mildly atrophic vaginal mucosa.  Cuff intact with several stitches visualized.  No discharge or bleeding.  Bimanual exam reveals cuff intact without fluctuance or discomfort.  Rectovaginal exam deferred.  Laboratory & Radiologic Studies: Reason for Addendum #1: DNA Mismatch Repair IHC Results  Reason for Amendment #1: Typographical error   FINAL MICROSCOPIC DIAGNOSIS:   A. UTERUS, CERVIX, BILATERAL FALLOPIAN TUBES, OVARIES, RESECTION:  - Uterus:    Endomyometrium: Poorly differentiated carcinoma with clear cell  features,  spanning 3.5 cm, see comment.       Tumor limited to upper half of myometrium.       Leiomyoma.       See oncology table.    Serosa: Fibrous adhesions with  endosalpingiosis. No malignancy.  - Cervix: Stroma involved by tumor.  - Bilateral ovaries: Inclusion cysts. No malignancy.  - Bilateral fallopian tubes: Unremarkable. No malignancy.   B. LYMPH NODE, RIGHT OBTURATOR, SENTINEL, BIOPSY:  - One of one lymph nodes negative for carcinoma (0/1).   C. LYMPH NODE, LEFT EXTERNAL ILIAC, SENTINEL, BIOPSY:  - One of one lymph nodes negative for carcinoma (0/1).   D. LYMPH NODE, LEFT EXTERNAL ILIAC, BIOPSY:  - One of one lymph nodes negative for carcinoma (0/1).   ONCOLOGY TABLE:   UTERUS, CARCINOMA OR CARCINOSARCOMA   Procedure: Total hysterectomy and bilateral salpingo-oophorectomy  Histologic type: Poorly differentiated carcinoma with clear cell  features, see comment.  Histologic Grade: High-grade  Myometrial invasion:    Depth of invasion:9 mm    Myometrial thickness: 20 mm  Uterine Serosa Involvement: Not identified  Cervical stromal involvement: Present  Extent of involvement of other organs: Not identified  Lymphovascular invasion: Not identified  Regional Lymph Nodes:  Examined:   3 Sentinel                0 non-sentinel                3 total     Lymph nodes with metastasis: 0     Isolated tumor cells (<0.2 mm): 0     Micrometastasis: (>0.2 mm and < 2.0 mm): 0     Macrometastasis: (>2.0 mm): 0     Extracapsular extension: Not applicable  Representative Tumor Block: A5  MMR / MSI testing: Will be ordered  Pathologic Stage Classification (pTNM, AJCC 8th edition): pT2, pN0  Comments: The tumor consists of solid sheets and more tubulocystic areas  with focal areas of cytoplasmic clearing. Immunohistochemistry is  positive for racemase (weak), Napsin-A (focal), and p53 (wild type). ER  and PR are negative. While extensive clear cell changes are not seen in  the more solid components, the immunoprofile along with more definitive  areas of clear cell changes favor the  overall tumor to be a clear cell  carcinoma. The tumor has a more pushing type of invasion. There is  stromal invasion in areas of endocervical mucosa. Immunohistochemistry  on the serosa adhesions reveals mesothelial cells (calretinin) and a  small focus of endosalpingiosis (PAX-8, ER, MOC31). Pancytokeratin on  the lymph nodes is negative.   (v4.1.0.2)   AMENDMENT NOTE: DIAGNOSTIC INFORMATION HAS NOT BEEN CHANGED. The  original report indicated an incorrect nodal stage in the oncology  table. The final report should read "pN0" INSTEAD OF "pNX". This has  been corrected.   SURGICAL PATHOLOGY  * THIS IS AN AMENDED REPORT *  CASE: [id number]  PATIENT: [name]  Surgical Pathology Report  * Amendment *   Reason for Addendum #1: DNA Mismatch Repair IHC Results  Reason for Amendment #1: Typographical error   Clinical History: Uterine cancer [rd]       FINAL MICROSCOPIC DIAGNOSIS:   A. UTERUS, CERVIX, BILATERAL FALLOPIAN TUBES, OVARIES, RESECTION:  - Uterus:    Endomyometrium: Poorly differentiated carcinoma with clear cell  features,  spanning 3.5 cm, see comment.       Tumor limited to upper half of myometrium.       Leiomyoma.       See oncology table.    Serosa: Fibrous adhesions with endosalpingiosis. No malignancy.  - Cervix: Stroma involved by tumor.  - Bilateral ovaries: Inclusion cysts. No malignancy.  - Bilateral fallopian tubes: Unremarkable. No malignancy.   B. LYMPH NODE, RIGHT OBTURATOR, SENTINEL, BIOPSY:  - One of one lymph nodes negative for carcinoma (0/1).   C. LYMPH NODE, LEFT EXTERNAL ILIAC, SENTINEL, BIOPSY:  - One of one lymph nodes negative for carcinoma (0/1).   D. LYMPH NODE, LEFT EXTERNAL ILIAC, BIOPSY:  - One of one lymph nodes negative for carcinoma (0/1).   ONCOLOGY TABLE:   UTERUS, CARCINOMA OR CARCINOSARCOMA   Procedure: Total hysterectomy and bilateral salpingo-oophorectomy  Histologic type: Poorly  differentiated carcinoma with clear cell  features, see comment.  Histologic Grade: High-grade  Myometrial invasion:    Depth of invasion:9 mm    Myometrial thickness: 20 mm  Uterine Serosa Involvement: Not identified  Cervical stromal involvement: Present  Extent of involvement of other organs: Not identified  Lymphovascular invasion: Not identified  Regional Lymph Nodes:    Examined:   3 Sentinel                0 non-sentinel  3 total     Lymph nodes with metastasis: 0     Isolated tumor cells (<0.2 mm): 0     Micrometastasis: (>0.2 mm and < 2.0 mm): 0     Macrometastasis: (>2.0 mm): 0     Extracapsular extension: Not applicable  Representative Tumor Block: A5  MMR / MSI testing: Will be ordered  Pathologic Stage Classification (pTNM, AJCC 8th edition): pT2, pN0  Comments: The tumor consists of solid sheets and more tubulocystic areas  with focal areas of cytoplasmic clearing. Immunohistochemistry is  positive for racemase (weak), Napsin-A (focal), and p53 (wild type). ER  and PR are negative. While extensive clear cell changes are not seen in  the more solid components, the immunoprofile along with more definitive  areas of clear cell changes favor the overall tumor to be a clear cell  carcinoma. The tumor has a more pushing type of invasion. There is  stromal invasion in areas of endocervical mucosa. Immunohistochemistry  on the serosa adhesions reveals mesothelial cells (calretinin) and a  small focus of endosalpingiosis (PAX-8, ER, MOC31). Pancytokeratin on  the lymph nodes is negative.   (v4.1.0.2)   AMENDMENT NOTE: DIAGNOSTIC INFORMATION HAS NOT BEEN CHANGED. The  original report indicated an incorrect nodal stage in the oncology  table. The final report should read "pN0" INSTEAD OF "pNX". This has  been corrected.    GROSS DESCRIPTION:   A: Specimen: TAH/BSO, received fresh  Specimen  integrity: Intact  Size and shape: 7.8 x 5.5 x 4.8 cm symmetrical uterus  Weight: The uterus weighs 96 g  Serosa: There are adhesions present on the posterior surface and a 2.2  cm pedunculated calcified tan-white subserosal nodule.  Cervix: The ectocervical mucosa is smooth, tan and the external os is  patent measuring 0.7 cm. The endocervical mucosa is glistening  tan-pink.  Endometrium: The endometrial cavity measures 5.2 x 2.8 cm. In the lower  portion of the endometrium there is a 3.5 x 3.5 cm circumferential,  friable tan-red polypoid mass which measures up to 2 cm in thickness.  The mass extends to the lower uterine segment. The mass focally  involves the underlying myometrium to a depth of 0.5 cm where the  myometrium measures 1.5 cm. The endometrium in the upper portion of the  endometrial cavity is tan-red and measures 0.2 cm in thickness.  Myometrium: The myometrium measures up to 2.2 cm in thickness.  Right adnexa: The right ovary is intact and measures 2.3 x 2.0 x 1.3 cm.  There are adhesions present on the cortical surface. The cut surface is  unremarkable. The right fallopian tube shows previous ligation with the  midportion absent. The tube measures 6.5 cm in length by up to 0.7 cm  in diameter.  Left adnexa: The left ovary is intact and measures 3.2 x 2.1 x 1.5 cm.  There are adhesions present on the cortical surface. There is a 1.8 cm  collapsed cyst without contents. The lining of the cyst is smooth. The  left tube shows previous ligation with the midportion absent. The tube  measures 6.5 cm in length up to 0.7 cm in diameter.  Block Summary:  14 blocks submitted  1 = cervix  2 = anterior lower uterine segment  3 = posterior lower uterine segment  4, 5, 6 = endometrial tumor with deepest myometrial involvement  7, 8 = uninvolved endomyometrium  9 = serosal adhesions  10 = decalcified section of subserosal nodule  11, 12 = right  adnexa  13, 14 = left  adnexa   B: Received fresh is a 1.5 x 0.8 x 0.6 cm rubbery tan-yellow nodule.  Specimen is sectioned and entirely submitted in 1 cassette.   C: Received fresh is a 0.6 x 0.5 x 0.3 cm portion of soft tan-yellow  tissue. The specimen is sectioned and entirely submitted in 1 cassette.   D: Received fresh is a 2.0 x 0.7 x 0.6 cm rubbery tan-yellow nodule.  The specimen is bisected and entirely submitted in 1 cassette. ([facility]  01/03/2019)       ADDENDUM:   Mismatch Repair Protein (IHC)   SUMMARY INTERPRETATION: NORMAL   There is preserved expression of the major MMR proteins. There is a very  low probability that microsatellite instability (MSI) is present.  However, certain clinically significant MMR protein mutations may result  in preservation of nuclear expression. It is recommended that the  preservation of protein expression be correlated with molecular based  MSI testing.   IHC EXPRESSION RESULTS   TEST      RESULT  MLH1:     Preserved nuclear expression  MSH2:     Preserved nuclear expression  MSH6:     Preserved nuclear expression  PMS2:     Preserved nuclear expression   Assessment & Plan: [name] is a 64 y.o. 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  The patient is doing very well from a postoperative standpoint.  We discussed continued activity restrictions until she is at the 6-8-week mark.  She knows to call our clinic if she develops bleeding or any other concerning symptoms.  Patient has met with Dr. Alvy Bimler already with plan to start systemic treatment towards the end of the month.  We discussed her case at tumor board and given her comorbid conditions and concerned that she may not tolerate chemotherapy very well, the recommendation was made that pelvic radiation would be an acceptable treatment  option if chemotherapy is not feasible.  I will plan to see the patient 3 months after she completes adjuvant treatment.  Jeral Pinch, MD  Division of Gynecologic Oncology  Department of Obstetrics and Gynecology  [facility]

## 2019-01-18 ENCOUNTER — Ambulatory Visit: Payer: Medicare HMO | Admitting: Gynecologic Oncology

## 2019-01-18 ENCOUNTER — Encounter (HOSPITAL_COMMUNITY): Payer: Self-pay | Admitting: Gynecologic Oncology

## 2019-01-18 ENCOUNTER — Encounter: Payer: Self-pay | Admitting: Hematology and Oncology

## 2019-01-18 ENCOUNTER — Inpatient Hospital Stay: Payer: Medicare Other | Attending: Gynecologic Oncology | Admitting: Hematology and Oncology

## 2019-01-18 ENCOUNTER — Other Ambulatory Visit: Payer: Self-pay

## 2019-01-18 ENCOUNTER — Inpatient Hospital Stay: Payer: Medicare Other

## 2019-01-18 VITALS — BP 160/61 | HR 76 | Temp 98.7°F | Resp 18 | Ht 66.0 in | Wt 230.8 lb

## 2019-01-18 DIAGNOSIS — E1169 Type 2 diabetes mellitus with other specified complication: Secondary | ICD-10-CM

## 2019-01-18 DIAGNOSIS — Z5111 Encounter for antineoplastic chemotherapy: Secondary | ICD-10-CM | POA: Insufficient documentation

## 2019-01-18 DIAGNOSIS — E119 Type 2 diabetes mellitus without complications: Secondary | ICD-10-CM | POA: Insufficient documentation

## 2019-01-18 DIAGNOSIS — Z90710 Acquired absence of both cervix and uterus: Secondary | ICD-10-CM | POA: Insufficient documentation

## 2019-01-18 DIAGNOSIS — Z9079 Acquired absence of other genital organ(s): Secondary | ICD-10-CM | POA: Insufficient documentation

## 2019-01-18 DIAGNOSIS — Z90722 Acquired absence of ovaries, bilateral: Secondary | ICD-10-CM | POA: Diagnosis not present

## 2019-01-18 DIAGNOSIS — D509 Iron deficiency anemia, unspecified: Secondary | ICD-10-CM | POA: Insufficient documentation

## 2019-01-18 DIAGNOSIS — I5033 Acute on chronic diastolic (congestive) heart failure: Secondary | ICD-10-CM | POA: Insufficient documentation

## 2019-01-18 DIAGNOSIS — C541 Malignant neoplasm of endometrium: Secondary | ICD-10-CM | POA: Insufficient documentation

## 2019-01-18 DIAGNOSIS — D500 Iron deficiency anemia secondary to blood loss (chronic): Secondary | ICD-10-CM

## 2019-01-18 DIAGNOSIS — Z7982 Long term (current) use of aspirin: Secondary | ICD-10-CM | POA: Diagnosis not present

## 2019-01-18 MED ORDER — PROCHLORPERAZINE MALEATE 10 MG PO TABS: 10.0000 mg | ORAL_TABLET | Freq: Four times a day (QID) | ORAL | 1 refills | Status: DC | PRN

## 2019-01-18 MED ORDER — DEXAMETHASONE 4 MG PO TABS: ORAL_TABLET | ORAL | 0 refills | Status: DC

## 2019-01-18 MED ORDER — ONDANSETRON HCL 8 MG PO TABS: 8.0000 mg | ORAL_TABLET | Freq: Three times a day (TID) | ORAL | 1 refills | Status: DC | PRN

## 2019-01-18 NOTE — Assessment & Plan Note (Signed)
Clinically, she is euvolemic with no signs or symptoms of congestive heart failure She will continue medical management She is aware of risk of fluid retention while on treatment and we discussed importance of aggressive dietary modification

## 2019-01-18 NOTE — Progress Notes (Signed)
[place] NOTE  Patient Care Team: Lennie Odor, PA-C as PCP - General (Nurse Practitioner) Jettie Booze, MD as PCP - Cardiology (Cardiology) Kelton Pillar, MD as Attending Physician (Family Medicine) Jettie Booze, MD (Cardiology) Jettie Booze, MD as Attending Physician (Cardiology)  ASSESSMENT & PLAN:  Endometrial cancer Surgical [facility]) We reviewed the NCCN guidelines We discussed the role of chemotherapy. The intent is of curative intent.  We discussed some of the risks, benefits, side-effects of carboplatin & Taxol. Treatment is intravenous, every 3 weeks x 6 cycles  Some of the short term side-effects included, though not limited to, including weight loss, life threatening infections, risk of allergic reactions, need for transfusions of blood products, nausea, vomiting, change in bowel habits, loss of hair, admission to hospital for various reasons, and risks of death.   Long term side-effects are also discussed including risks of infertility, permanent damage to nerve function, hearing loss, chronic fatigue, kidney damage with possibility needing hemodialysis, and rare secondary malignancy including bone marrow disorders.  The patient is aware that the response rates discussed earlier is not guaranteed.  After a long discussion, patient made an informed decision to proceed with the prescribed plan of care.   Patient education material was dispensed. We discussed premedication with dexamethasone before chemotherapy. She has reasonable venous access.  I do not plan upfront port placement I recommend chemo education class today I do not recommend G-CSF support upfront Due to high calculated dose of treatment, I plan to keep maximum dose of carboplatin at 750 mg and to reduce upfront dose of Taxol to 140 mg per metered square I will see her again prior to cycle 1 of treatment, currently planned around January 25th  Iron deficiency anemia She  was noted to have severe anemia postoperatively, likely exacerbated by baseline iron deficiency anemia from postmenopausal bleeding I will check iron study in her next visit.  Diabetes mellitus ([place]) We discussed the risk of worsening hyperglycemia while on treatment due to corticosteroid therapy We discussed importance of dietary modification and close follow-up with her primary care doctor for medical management  Acute on chronic diastolic CHF (congestive heart failure) ([place]) Clinically, she is euvolemic with no signs or symptoms of congestive heart failure She will continue medical management She is aware of risk of fluid retention while on treatment and we discussed importance of aggressive dietary modification   Orders Placed This Encounter  Procedures  . CBC with Differential (Cancer Center Only)    Standing Status:   Standing    Number of Occurrences:   20    Standing Expiration Date:   01/18/2020  . CMP ([place] only)    Standing Status:   Standing    Number of Occurrences:   20    Standing Expiration Date:   01/18/2020  . Ferritin    Standing Status:   Future    Standing Expiration Date:   02/22/2020  . Iron and TIBC    Standing Status:   Future    Standing Expiration Date:   02/22/2020    The total time spent in the appointment was 75 minutes encounter with patients including review of chart and various tests results, discussions about plan of care and coordination of care plan   All questions were answered. The patient knows to call the clinic with any problems, questions or concerns. No barriers to learning was detected.  Heath Lark, MD 1/8/20214:11 PM  CHIEF COMPLAINTS/PURPOSE OF CONSULTATION:  Uterine cancer,  for adjuvant treatment  HISTORY OF PRESENTING ILLNESS:  [name] 64 y.o. female is here because of recent diagnosis of uterine cancer She is here accompanied by her daughter, [name] She was discovered to have postmenopausal bleeding several months  ago and was subsequently found to have uterine cancer.  She underwent surgery approximately 2 weeks ago  I have reviewed her chart and materials related to her cancer extensively and collaborated history with the patient. Summary of oncologic history is as follows: Oncology History Overview Note  Clear cell features MSI stable   Endometrial cancer ([place])  09/24/2018 Initial Diagnosis   She presented with post menopausal bleeding   11/20/2018 Initial Biopsy   EMB 11/10: G3 carcinoma with clear cell features.  Pap 11/10: AGC    12/03/2018 Surgery   TRH/BSO, bil SLNs   12/05/2018 Imaging   CT C/A/P:  1. Low attenuation mass or fluid in the endometrial cavity (series 2, image 110), in keeping with known endometrial malignancy. 2. Numerous small bilateral ground-glass pulmonary nodules. Although nonspecific these are likely infectious or inflammatory given appearance, isolated manifestation of metastatic disease much less favored. 3. Enlarged mediastinal and hilar lymph nodes, likely reactive given pulmonary findings. 4. No evidence of metastatic disease in the abdomen or pelvis. 5. Nonobstructive right nephrolithiasis. 6. Aortic Atherosclerosis (ICD10-I70.0).   01/02/2019 Pathologic Stage   Stage II Gr3 (clear cell features), no LVSI, cervial stroma involved, <50% MI  A. 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            Tumor limited to upper half of myometrium.            Leiomyoma.            See oncology table.       Serosa: Fibrous adhesions with endosalpingiosis. No malignancy.  - Cervix: Stroma involved by tumor.  - Bilateral ovaries: Inclusion cysts. No malignancy.  - Bilateral fallopian tubes: Unremarkable. No malignancy.   B. LYMPH NODE, RIGHT OBTURATOR, SENTINEL, BIOPSY:  - One of one lymph nodes negative for carcinoma (0/1).    C. LYMPH NODE, LEFT EXTERNAL ILIAC, SENTINEL, BIOPSY:  - One of one lymph nodes negative for carcinoma (0/1).   D. LYMPH NODE, LEFT EXTERNAL ILIAC, BIOPSY:  - One of one lymph nodes negative for carcinoma (0/1).   ONCOLOGY TABLE:   UTERUS, CARCINOMA OR CARCINOSARCOMA   Procedure: Total hysterectomy and bilateral salpingo-oophorectomy  Histologic type: Poorly differentiated carcinoma with clear cell  features, see comment.  Histologic Grade: High-grade  Myometrial invasion:       Depth of invasion:9 mm       Myometrial thickness: 20 mm  Uterine Serosa Involvement: Not identified  Cervical stromal involvement: Present  Extent of involvement of other organs: Not identified  Lymphovascular invasion: Not identified  Regional Lymph Nodes:       Examined:     3 Sentinel                               0 non-sentinel                               3 total        Lymph nodes with metastasis: 0  Isolated tumor cells (<0.2 mm): 0        Micrometastasis:  (>0.2 mm and < 2.0 mm): 0        Macrometastasis: (>2.0 mm): 0        Extracapsular extension: Not applicable  Representative Tumor Block: A5  MMR / MSI testing: Will be ordered  Pathologic Stage Classification (pTNM, AJCC 8th edition):  pT2, pNX  Comments: The tumor consists of solid sheets and more tubulocystic areas with focal areas of cytoplasmic clearing. Immunohistochemistry is positive for racemase (weak), Napsin-A (focal), and p53 (wild type). ER and PR are negative. While extensive clear cell changes are not seen in the more solid components, the immunoprofile along with more definitive areas of clear cell changes favor the overall tumor to be a clear cell carcinoma. The tumor has a more pushing type of invasion. There is stromal invasion in areas of endocervical mucosa. Immunohistochemistry on the serosa adhesions reveals mesothelial cells (calretinin) and a small focus of endosalpingiosis (PAX-8, ER, MOC31). Pancytokeratin on  the lymph nodes is negative.    01/18/2019 Cancer Staging   Staging form: Corpus Uteri - Carcinoma and Carcinosarcoma, AJCC 8th Edition - Pathologic: Stage II (pT2, pN0, cM0) - Signed by Heath Lark, MD on 01/18/2019   02/04/2019 -  Chemotherapy   The patient had palonosetron (ALOXI) injection 0.25 mg, 0.25 mg, Intravenous,  Once, 0 of 6 cycles CARBOplatin (PARAPLATIN) 750 mg in sodium chloride 0.9 % 250 mL chemo infusion, 750 mg (100 % of original dose 750 mg), Intravenous,  Once, 0 of 6 cycles Dose modification: 750 mg (original dose 750 mg, Cycle 1, Reason: Provider Judgment) fosaprepitant (EMEND) 150 mg in sodium chloride 0.9 % 145 mL IVPB, 150 mg, Intravenous,  Once, 0 of 6 cycles PACLitaxel (TAXOL) 312 mg in sodium chloride 0.9 % 500 mL chemo infusion (> 5m/m2), 140 mg/m2 = 312 mg (80 % of original dose 175 mg/m2), Intravenous,  Once, 0 of 6 cycles Dose modification: 140 mg/m2 (80 % of original dose 175 mg/m2, Cycle 1, Reason: Provider Judgment)  for chemotherapy treatment.    She tolerated surgery well She has minimum incisional pain No recent bleeding No recent exacerbation of congestive heart failure She denies baseline peripheral neuropathy from diabetes She states that her diabetes control at home is suboptimal with recent documented blood sugar in the 200 range. She denies recent weight loss since surgery  MEDICAL HISTORY:  Past Medical History:  Diagnosis Date  . Abnormal chest CT   . Aortic atherosclerosis ([place]   . Asthma   . Atrial enlargement, left    severe  . Atrial fibrillation ([place] 01/05/2011   Chronic persistent, failed DCCV   . Atrial flutter ([place]   . Bronchospasm 1998  . Cardiomegaly   . Carotid bruit   . Chronic diastolic congestive heart failure ([place]   . Diabetes mellitus    insulin dependent  . Ejection fraction < 50%    35-40%,   . Heart murmur   . History of blood transfusion   . Hypercholesterolemia   . Hypertension   . Junctional rhythm  09/14/2018   Noted on EKG  . Left bundle branch block (LBBB) 09/14/2018   Noted on EKG  . LVH (left ventricular hypertrophy) 10/10/2016   Moderate, Noted on ECHO  . Mitral regurgitation   . Obesity   . Obesity (BMI 30-39.9) 01/16/2012  . Persistent atrial fibrillation ([place]   . Polyp of rectum   . Rheumatic fever 01/16/2012  Reported during childhood  . S/P Maze operation for atrial fibrillation 02/15/2012   Complete biatrial lesion set using bipolar radiofrequency and cryothermy ablation with clipping of LA appendage  . S/P mitral valve replacement with metallic valve 01/16/6158   33m Sorin [employer]omedics Optiform mechanical prosthesis  . 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 Shortness of breath    with exertion  . Sleep apnea    DOES NOT HAVE CPAP  . [facility]   Post op , left arm weakness  . Tricuspid regurgitation 01/16/2012  . Uterine cancer ([facility]    SURGICAL HISTORY: Past Surgical History:  Procedure Laterality Date  . CARDIAC CATHETERIZATION  >5 years  . CARDIOVASCULAR STRESS TEST  09/2010  . CARDIOVERSION  03/25/2011   Procedure: CARDIOVERSION;  Surgeon: JaJettie BoozeMD;  [facility] Service: Cardiovascular;  Laterality: N/A;  . CARDIOVERSION N/A 07/27/2012   Procedure: CARDIOVERSION;  Surgeon: JaJettie BoozeMD;  [place] Service: Cardiovascular;  Laterality: N/A;  . CESAREAN SECTION    . COLONOSCOPY WITH PROPOFOL N/A 04/27/2016   Procedure: COLONOSCOPY WITH PROPOFOL;  Surgeon: ViWilford CornerMD;  [facility];  Service: Endoscopy;  Laterality: N/A;  . INTRAOPERATIVE TRANSESOPHAGEAL ECHOCARDIOGRAM  02/15/2012   Procedure: INTRAOPERATIVE TRANSESOPHAGEAL ECHOCARDIOGRAM;  Surgeon: ClRexene AlbertsMD;  [facility] Service: Open Heart Surgery;  Laterality: N/A;  . MAZE  02/15/2012   Procedure: MAZE;  Surgeon: ClRexene AlbertsMD;  [facility] Service: Open Heart Surgery;   Laterality: N/A;  . MITRAL VALVE REPLACEMENT  02/15/2012   Procedure: MITRAL VALVE (MV) REPLACEMENT;  Surgeon: ClRexene AlbertsMD;  Location: [facility] Service: Open Heart Surgery;  Laterality: N/A;  . ROBOTIC ASSISTED LAPAROSCOPIC HYSTERECTOMY AND SALPINGECTOMY Bilateral 01/02/2019   Procedure: XI ROBOTIC ASSISTED LAPAROSCOPIC TOTAL HYSTERECTOMY WITH BILATERAL SALPINGOOPHORECTOMY, SENTINEL LYMPH NODE BIOPSY;  Surgeon: TuLafonda MossesMD;  Location: [facility] ORS;  Service: Gynecology;  Laterality: Bilateral;  . TEE WITHOUT CARDIOVERSION  12/21/2011   Procedure: TRANSESOPHAGEAL ECHOCARDIOGRAM (TEE);  Surgeon: JaJettie BoozeMD;  [facility];  Service: Cardiovascular;  Laterality: N/A;  . TRICUSPID VALVE REPLACEMENT  02/15/2012   Procedure: TRICUSPID VALVE REPAIR;  Surgeon: ClRexene AlbertsMD;  [place] [facility] Service: Open Heart Surgery;  Laterality: N/A;  . TUBAL LIGATION     at time of her c-section    SOCIAL HISTORY: Social History   Socioeconomic History  . Marital status: Married    Spouse name: Not on file  . Number of children: Not on file  . Years of education: Not on file  . Highest education level: Not on file  Occupational History  . Occupation: [employer]  Tobacco Use  . Smoking status: Never Smoker  . Smokeless tobacco: Never Used  Substance and Sexual Activity  . Alcohol use: No  . Drug use: No  . Sexual activity: Yes    Birth control/protection: Post-menopausal  Other Topics Concern  . Not on file  Social History Narrative   [place]  Works at [place]2 grown children, 1 grandchild   Social Determinants of HeRadio broadcast assistanttrain:   . Difficulty of Paying Living Expenses: Not on file  Food Insecurity:   . Worried About RuCharity fundraisern the Last Year: Not on file  .  Ran Out of Food in the Last Year: Not on file  Transportation Needs:   . Lack  of Transportation (Medical): Not on file  . Lack of Transportation (Non-Medical): Not on file  Physical Activity:   . Days of Exercise per Week: Not on file  . Minutes of Exercise per Session: Not on file  Stress:   . Feeling of Stress : Not on file  Social Connections:   . Frequency of Communication with Friends and Family: Not on file  . Frequency of Social Gatherings with Friends and Family: Not on file  . Attends Religious Services: Not on file  . Active Member of Clubs or Organizations: Not on file  . Attends Archivist Meetings: Not on file  . Marital Status: Not on file  Intimate Partner Violence:   . Fear of Current or Ex-Partner: Not on file  . Emotionally Abused: Not on file  . Physically Abused: Not on file  . Sexually Abused: Not on file    FAMILY HISTORY: Family History  Problem Relation Age of Onset  . Asthma Mother   . Diabetes Mother   . Hypertension Mother   . Hypertension Brother   . Heart attack Neg Hx   . Stroke Neg Hx   . Colon cancer Neg Hx   . Colon polyps Neg Hx   . Liver disease Neg Hx   . Uterine cancer Neg Hx   . Ovarian cancer Neg Hx   . Breast cancer Neg Hx     ALLERGIES:  is allergic to chlorhexidine.  MEDICATIONS:  Current Outpatient Medications  Medication Sig Dispense Refill  . amiodarone (PACERONE) 200 MG tablet TAKE 1 TABLET(200 MG) BY MOUTH DAILY (Patient taking differently: Take 200 mg by mouth daily. ) 90 tablet 3  . amLODipine (NORVASC) 5 MG tablet Take 1 tablet (5 mg total) by mouth daily. 180 tablet 3  . atorvastatin (LIPITOR) 40 MG tablet Take 1 tablet (40 mg total) by mouth daily. 90 tablet 3  . calcium carbonate (OS-CAL - DOSED IN MG OF ELEMENTAL CALCIUM) 1250 MG tablet Take 1 tablet by mouth daily.      . carvedilol (COREG) 12.5 MG tablet Take 1 tablet (12.5 mg total) by mouth 2 (two) times daily with a meal. 180 tablet 3  . Cholecalciferol (VITAMIN D) 1000 UNITS capsule Take 1,000 Units by mouth daily.     .  cyclobenzaprine (FLEXERIL) 10 MG tablet Take 10 mg by mouth daily.     [name] dexamethasone (DECADRON) 4 MG tablet Take 2 tabs at the night before chemotherapy, every 3 weeks, by mouth x 6 cycles, please dispense 12 tabs 60 tablet 0  . insulin glargine (LANTUS SOLOSTAR) 100 UNIT/ML injection Inject 30-35 Units into the skin See admin instructions. Inject 30 units into the skin in the morning and 35 units at bedtime.    . irbesartan (AVAPRO) 300 MG tablet Take 1 tablet (300 mg total) by mouth daily. 90 tablet 3  . metFORMIN (GLUCOPHAGE) 500 MG tablet Take 500 mg by mouth 2 (two) times daily with a meal.     . methocarbamol (ROBAXIN) 500 MG tablet Take 500 mg by mouth every 8 (eight) hours as needed for muscle spasms.     . ondansetron (ZOFRAN) 8 MG tablet Take 1 tablet (8 mg total) by mouth every 8 (eight) hours as needed. 30 tablet 1  . prochlorperazine (COMPAZINE) 10 MG tablet Take 1 tablet (10 mg total) by mouth every 6 (six) hours  as needed (Nausea or vomiting). 30 tablet 1  . senna (SENOKOT) 8.6 MG TABS tablet Take 1 tablet (8.6 mg total) by mouth daily as needed for up to 15 doses for mild constipation. 15 tablet 0  . warfarin (COUMADIN) 4 MG tablet Take 1 to 1.5 tablets daily as directed by [facility]madin Clinic 135 tablet 1   No current facility-administered medications for this visit.    REVIEW OF SYSTEMS:   Constitutional: Denies fevers, chills or abnormal night sweats Eyes: Denies blurriness of vision, double vision or watery eyes Ears, nose, mouth, throat, and face: Denies mucositis or sore throat Respiratory: Denies cough, dyspnea or wheezes Cardiovascular: Denies palpitation, chest discomfort or lower extremity swelling Gastrointestinal:  Denies nausea, heartburn or change in bowel habits Skin: Denies abnormal skin rashes Lymphatics: Denies new lymphadenopathy or easy bruising Neurological:Denies numbness, tingling or new weaknesses Behavioral/Psych: Mood is stable, no new changes  All  other systems were reviewed with the patient and are negative.  PHYSICAL EXAMINATION: ECOG PERFORMANCE STATUS: 1 - Symptomatic but completely ambulatory  Vitals:   01/18/19 1144  BP: (!) 160/61  Pulse: 76  Resp: 18  Temp: 98.7 F (37.1 C)  SpO2: 99%   Filed Weights   01/18/19 1144  Weight: 230 lb 12.8 oz (104.7 kg)    GENERAL:alert, no distress and comfortable SKIN: skin color, texture, turgor are normal, no rashes or significant lesions EYES: normal, conjunctiva are pink and non-injected, sclera clear OROPHARYNX:no exudate, no erythema and lips, buccal mucosa, and tongue normal  NECK: supple, thyroid normal size, non-tender, without nodularity LYMPH:  no palpable lymphadenopathy in the cervical, axillary or inguinal LUNGS: clear to auscultation and percussion with normal breathing effort HEART: regular rate & rhythm systolic click consistent with valve replacement, with mild bilateral lower extremity edema ABDOMEN:abdomen soft, non-tender and normal bowel sounds.  Noted well-healed surgical scar Musculoskeletal:no cyanosis of digits and no clubbing  PSYCH: alert & oriented x 3 with fluent speech NEURO: no focal motor/sensory deficits  LABORATORY DATA:  I have reviewed the data as listed Lab Results  Component Value Date   WBC 5.3 01/05/2019   HGB 8.2 (L) 01/05/2019   HCT 27.2 (L) 01/05/2019   MCV 101.5 (H) 01/05/2019   PLT 176 01/05/2019   Recent Labs    11/30/18 1048 01/04/19 1221 01/05/19 1006 01/10/19 0839  NA 139 136 136 140  K 4.0 4.4 4.5 3.9  CL 107 108 111 107*  CO2 23 21* 20* 20  GLUCOSE 106* 171* 207* 72  BUN 14 29* 20 12  CREATININE 0.92 1.39* 1.10* 0.81  CALCIUM 9.2 8.3* 8.5* 8.9  GFRNONAA >60 40* 53* 77  GFRAA >60 46* >60 89  PROT 8.1  --   --   --   ALBUMIN 3.9  --   --   --   AST 28  --   --   --   ALT 21  --   --   --   ALKPHOS 102  --   --   --   BILITOT 0.6  --   --   --     RADIOGRAPHIC STUDIES: I have also reviewed her recent CT  imaging I have personally reviewed the radiological images as listed and agreed with the findings in the report. DG Cystogram  Result Date: 01/04/2019 CLINICAL DATA:  Post hysterectomy evaluation for possible bladder injury. EXAM: CYSTOGRAM TECHNIQUE: After catheterization of the urinary bladder following sterile technique the bladder was filled with 250 mL  Cysto-Hypaque 30% by drip infusion. Serial spot images were obtained during bladder filling and post draining. FLUOROSCOPY TIME:  Fluoroscopy Time:  1 minutes 0 seconds. Radiation Exposure Index (if provided by the fluoroscopic device): 90 mGy Number of Acquired Spot Images: 9 COMPARISON:  None. FINDINGS: Examination demonstrates the bladder to be normal in size, shape and position with normal contour. There is no evidence of contrast extravasation/leak. Remainder of exam is unremarkable. IMPRESSION: Normal cystogram without evidence of bladder injury/leak. Electronically Signed   By: Marin Olp M.D.   On: 01/04/2019 11:32   US RENAL  Result Date: 01/03/2019 CLINICAL DATA:  Acute kidney injury EXAM: RENAL / URINARY TRACT ULTRASOUND COMPLETE COMPARISON:  CT 12/05/2018 FINDINGS: Right Kidney: Renal measurements: 10.5 x 4.2 x 5.8 cm = volume: 122 mL . Echogenicity within normal limits. No mass or hydronephrosis visualized. Left Kidney: Renal measurements: 11.3 x 6.0 x 4.8 cm = volume: 171 mL. Echogenicity within normal limits. No mass or hydronephrosis visualized. Bladder: Appears normal for degree of bladder distention. Other: None. IMPRESSION: No acute findings.  No hydronephrosis.  Normal echotexture. Electronically Signed   By: Rolm Baptise M.D.   On: 01/03/2019 09:28

## 2019-01-18 NOTE — Assessment & Plan Note (Signed)
We discussed the risk of worsening hyperglycemia while on treatment due to corticosteroid therapy We discussed importance of dietary modification and close follow-up with her primary care doctor for medical management

## 2019-01-18 NOTE — Assessment & Plan Note (Signed)
We reviewed the NCCN guidelines We discussed the role of chemotherapy. The intent is of curative intent.  We discussed some of the risks, benefits, side-effects of carboplatin & Taxol. Treatment is intravenous, every 3 weeks x 6 cycles  Some of the short term side-effects included, though not limited to, including weight loss, life threatening infections, risk of allergic reactions, need for transfusions of blood products, nausea, vomiting, change in bowel habits, loss of hair, admission to hospital for various reasons, and risks of death.   Long term side-effects are also discussed including risks of infertility, permanent damage to nerve function, hearing loss, chronic fatigue, kidney damage with possibility needing hemodialysis, and rare secondary malignancy including bone marrow disorders.  The patient is aware that the response rates discussed earlier is not guaranteed.  After a long discussion, patient made an informed decision to proceed with the prescribed plan of care.   Patient education material was dispensed. We discussed premedication with dexamethasone before chemotherapy. She has reasonable venous access.  I do not plan upfront port placement I recommend chemo education class today I do not recommend G-CSF support upfront Due to high calculated dose of treatment, I plan to keep maximum dose of carboplatin at 750 mg and to reduce upfront dose of Taxol to 140 mg per metered square I will see her again prior to cycle 1 of treatment, currently planned around January 25th

## 2019-01-18 NOTE — Assessment & Plan Note (Signed)
She was noted to have severe anemia postoperatively, likely exacerbated by baseline iron deficiency anemia from postmenopausal bleeding I will check iron study in her next visit.

## 2019-01-18 NOTE — Progress Notes (Signed)
START ON PATHWAY REGIMEN - Uterine     A cycle is every 21 days:     Paclitaxel      Carboplatin   **Always confirm dose/schedule in your pharmacy ordering system**  Patient Characteristics: Clear Cell Histology, Newly Diagnosed, Resected Histology: Clear Cell Histology Therapeutic Status: Newly Diagnosed AJCC T Category: T2 AJCC N Category: N0 AJCC M Category: M0 AJCC 8 Stage Grouping: II Surgical Status: Resected Intent of Therapy: Curative Intent, Discussed with Patient

## 2019-01-21 ENCOUNTER — Other Ambulatory Visit: Payer: Self-pay | Admitting: Oncology

## 2019-01-21 ENCOUNTER — Telehealth: Payer: Self-pay | Admitting: Hematology and Oncology

## 2019-01-21 LAB — SURGICAL PATHOLOGY

## 2019-01-21 NOTE — Progress Notes (Signed)
Gynecologic Oncology Multi-Disciplinary Disposition Conference Note  Date of the Conference: 01/21/2019  Patient Name: [name]  Referring Provider: Lennie Odor, PA-C Primary GYN Oncologist: Dr. 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 Disposition is to chemotherapy with vaginal brachytherapy. If chemotherapy is not tolerated, then whole pelvic radiation plus brachytherapy.   This Multidisciplinary conference took place involving physicians from [facility], Radiation Oncology, Pathology, Radiology along with the Gynecologic Oncology Nurse Practitioner and RN.  Comprehensive assessment of the patient's malignancy, staging, need for surgery, chemotherapy, radiation therapy, and need for further testing were reviewed. Supportive measures, both inpatient and following discharge were also discussed. The recommended plan of care is documented. Greater than 35 minutes were spent correlating and coordinating this patient's care.

## 2019-01-21 NOTE — Telephone Encounter (Signed)
Scheduled apt per 1/8 sch message - unable to reach pt at either number listed. Left message on mobile. With appt date and time

## 2019-01-21 NOTE — Progress Notes (Signed)
Radiation Oncology         [phone] ________________________________  Initial Outpatient Consultation  Name: [name] MRN: [id number]  Date: 01/23/2019  DOB: Mar 31, 1955  [id number], [name], PA-C  [name], [place], PA-C   REFERRING PHYSICIAN: Lennie Odor, PA-C  DIAGNOSIS: The encounter diagnosis was Endometrial cancer ([place]).  Stage II endometrial cancer, high-grade  HISTORY OF PRESENT ILLNESS::[name] is a 64 y.o. female who is accompanied by no one due to COVID-19 restrictions. The patient presented to [facility], [facility], on 09/24/2018 with complaint of post-menopausel bleeding.  Patient underwent biopsy on 11/20/2018 that showed high grade carcinoma with clear cell features of the endometrium.  CT of chest/abdomen/pelvis on 12/05/2018 showed a low attenuation mass or fluid in the endometrial cavity, numerous nonspecific small bilateral ground-glass pulmonary nodules, and enlarged mediastinal and hilar lymph nodes. No evidence of metastatic disease in the abdomen or pelvis.  Patient underwent robotic assisted hysterectomy with bilateral salpingo-oophorectomy and sentinel node biopsy 01/02/2019. 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 No malignancy seen in bilateral ovaries or bilateral fallopian tubes. One right obturator lymph node and two left external iliac lymph nodes were biopsied and all negative for carcinoma.  Patient was seen by Dr. Alvy Bimler on 01/18/2019, during which time they discussed chemotherapy with Carboplatin and Taxol that is anticipated to begin on 02/04/2019.  PREVIOUS RADIATION THERAPY: No  PAST MEDICAL HISTORY:  Past Medical History:  Diagnosis Date  . Abnormal chest CT   . Aortic atherosclerosis ([place])   . Asthma   . Atrial  enlargement, left    severe  . Atrial fibrillation ([place]) 01/05/2011   Chronic persistent, failed DCCV   . Atrial flutter ([place])   . Bronchospasm 1998  . Cardiomegaly   . Carotid bruit   . Chronic diastolic congestive heart failure ([place])   . Diabetes mellitus    insulin dependent  . Ejection fraction < 50%    35-40%,   . Heart murmur   . History of blood transfusion   . Hypercholesterolemia   . Hypertension   . Junctional rhythm 09/14/2018   Noted on EKG  . Left bundle branch block (LBBB) 09/14/2018   Noted on EKG  . LVH (left ventricular hypertrophy) 10/10/2016   Moderate, Noted on ECHO  . Mitral regurgitation   . Obesity   . Obesity (BMI 30-39.9) 01/16/2012  . Persistent atrial fibrillation ([place])   . Polyp of rectum   . Rheumatic fever 01/16/2012   Reported during childhood  . S/P Maze operation for atrial fibrillation 02/15/2012   Complete biatrial lesion set using bipolar radiofrequency and cryothermy ablation with clipping of LA appendage  . S/P mitral valve replacement with metallic valve [id number]   64mm Sorin [employer]omedics Optiform mechanical prosthesis  . 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 Shortness of breath    with exertion  . Sleep apnea    DOES NOT HAVE CPAP  . Stroke [facility]) 2014   Post op , left arm weakness  . Tricuspid regurgitation 01/16/2012  . Uterine cancer ([place])     PAST SURGICAL HISTORY: Past Surgical History:  Procedure Laterality Date  . CARDIAC CATHETERIZATION  >5 years  . CARDIOVASCULAR STRESS TEST  09/2010  . CARDIOVERSION  03/25/2011   Procedure: CARDIOVERSION;  Surgeon: Charlann Lange.  Irish Lack, MD;  Location: [place];  Service: Cardiovascular;  Laterality: N/A;  . CARDIOVERSION N/A 07/27/2012   Procedure: CARDIOVERSION;  Surgeon: Jettie Booze, MD;  Location: [place];  Service: Cardiovascular;  Laterality: N/A;  . CESAREAN SECTION    . COLONOSCOPY WITH PROPOFOL N/A 04/27/2016   Procedure: COLONOSCOPY  WITH PROPOFOL;  Surgeon: Wilford Corner, MD;  Location: [facility] ENDOSCOPY;  Service: Endoscopy;  Laterality: N/A;  . INTRAOPERATIVE TRANSESOPHAGEAL ECHOCARDIOGRAM  02/15/2012   Procedure: INTRAOPERATIVE TRANSESOPHAGEAL ECHOCARDIOGRAM;  Surgeon: Rexene Alberts, MD;  Location: [place];  Service: Open Heart Surgery;  Laterality: N/A;  . MAZE  02/15/2012   Procedure: MAZE;  Surgeon: Rexene Alberts, MD;  Location: [place];  Service: Open Heart Surgery;  Laterality: N/A;  . MITRAL VALVE REPLACEMENT  02/15/2012   Procedure: MITRAL VALVE (MV) REPLACEMENT;  Surgeon: Rexene Alberts, MD;  Location: [place];  Service: Open Heart Surgery;  Laterality: N/A;  . ROBOTIC ASSISTED LAPAROSCOPIC HYSTERECTOMY AND SALPINGECTOMY Bilateral 01/02/2019   Procedure: XI ROBOTIC ASSISTED LAPAROSCOPIC TOTAL HYSTERECTOMY WITH BILATERAL SALPINGOOPHORECTOMY, SENTINEL LYMPH NODE BIOPSY;  Surgeon: [name], MD;  Location: [facility];  Service: Gynecology;  Laterality: Bilateral;  . TEE WITHOUT CARDIOVERSION  12/21/2011   Procedure: TRANSESOPHAGEAL ECHOCARDIOGRAM (TEE);  Surgeon: Jettie Booze, MD;  Location: [facility] ENDOSCOPY;  Service: Cardiovascular;  Laterality: N/A;  . TRICUSPID VALVE REPLACEMENT  02/15/2012   Procedure: TRICUSPID VALVE REPAIR;  Surgeon: Rexene Alberts, MD;  Location: [place];  Service: Open Heart Surgery;  Laterality: N/A;  . TUBAL LIGATION     at time of her c-section    FAMILY HISTORY:  Family History  Problem Relation Age of Onset  . Asthma Mother   . Diabetes Mother   . Hypertension Mother   . Hypertension Brother   . Heart attack Neg Hx   . Stroke Neg Hx   . Colon cancer Neg Hx   . Colon polyps Neg Hx   . Liver disease Neg Hx   . Uterine cancer Neg Hx   . Ovarian cancer Neg Hx   . Breast cancer Neg Hx     SOCIAL HISTORY:  Social History   Tobacco Use  . Smoking status: Never Smoker  . Smokeless tobacco: Never Used  Substance Use Topics  . Alcohol use: No  . Drug use: No    ALLERGIES:    Allergies  Allergen Reactions  . Chlorhexidine     MEDICATIONS:  Current Outpatient Medications  Medication Sig Dispense Refill  . amiodarone (PACERONE) 200 MG tablet TAKE 1 TABLET(200 MG) BY MOUTH DAILY (Patient taking differently: Take 200 mg by mouth daily. ) 90 tablet 3  . amLODipine (NORVASC) 5 MG tablet Take 1 tablet (5 mg total) by mouth daily. 180 tablet 3  . atorvastatin (LIPITOR) 40 MG tablet Take 1 tablet (40 mg total) by mouth daily. 90 tablet 3  . calcium carbonate (OS-CAL - DOSED IN MG OF ELEMENTAL CALCIUM) 1250 MG tablet Take 1 tablet by mouth daily.      . carvedilol (COREG) 12.5 MG tablet Take 1 tablet (12.5 mg total) by mouth 2 (two) times daily with a meal. 180 tablet 3  . Cholecalciferol (VITAMIN D) 1000 UNITS capsule Take 1,000 Units by mouth daily.     . cyclobenzaprine (FLEXERIL) 10 MG tablet Take 10 mg by mouth daily.     . insulin glargine (LANTUS SOLOSTAR) 100 UNIT/ML injection Inject 30-35 Units into the skin See admin instructions. Inject 30  units into the skin in the morning and 35 units at bedtime.    . irbesartan (AVAPRO) 300 MG tablet Take 1 tablet (300 mg total) by mouth daily. 90 tablet 3  . metFORMIN (GLUCOPHAGE) 500 MG tablet Take 500 mg by mouth 2 (two) times daily with a meal.     . methocarbamol (ROBAXIN) 500 MG tablet Take 500 mg by mouth every 8 (eight) hours as needed for muscle spasms.     . ondansetron (ZOFRAN) 8 MG tablet Take 1 tablet (8 mg total) by mouth every 8 (eight) hours as needed. 30 tablet 1  . prochlorperazine (COMPAZINE) 10 MG tablet Take 1 tablet (10 mg total) by mouth every 6 (six) hours as needed (Nausea or vomiting). 30 tablet 1  . senna (SENOKOT) 8.6 MG TABS tablet Take 1 tablet (8.6 mg total) by mouth daily as needed for up to 15 doses for mild constipation. 15 tablet 0  . warfarin (COUMADIN) 4 MG tablet Take 1 to 1.5 tablets daily as directed by [facility] 135 tablet 1  . dexamethasone (DECADRON) 4 MG tablet Take 2 tabs at  the night before chemotherapy, every 3 weeks, by mouth x 6 cycles, please dispense 12 tabs (Patient not taking: Reported on 01/22/2019) 60 tablet 0   No current facility-administered medications for this encounter.    REVIEW OF SYSTEMS: REVIEW OF SYSTEMS: A 10+ POINT REVIEW OF SYSTEMS WAS OBTAINED including neurology, dermatology, psychiatry, cardiac, respiratory, lymph, extremities, GI, GU, musculoskeletal, constitutional, reproductive, HEENT. All pertinent positives are noted in the HPI. All others are negative.  Patient denies any vaginal bleeding since her surgery.  She denies any pelvic pain.  She denies any numbness or weakness in her lower extremities   PHYSICAL EXAM:  height is 5\' 6"  (1.676 m) and weight is 225 lb (102.1 kg). Her temporal temperature is 97.2 F (36.2 C) (abnormal). Her blood pressure is 158/73 (abnormal) and her pulse is 64. Her respiration is 18 and oxygen saturation is 100%.   General: Alert and oriented, in no acute distress HEENT: Head is normocephalic. Extraocular movements are intact. Oropharynx is clear. Neck: Neck is supple, no palpable cervical or supraclavicular lymphadenopathy. Heart: Regular in rate and rhythm with no murmurs, rubs, or gallops. Chest: Clear to auscultation bilaterally, with no rhonchi, wheezes, or rales. Abdomen: Soft, nontender, nondistended, with no rigidity or guarding.  Laparoscopy scars healing well without signs of drainage or infection. Extremities: No cyanosis or edema. Lymphatics: see Neck Exam Skin: No concerning lesions. Musculoskeletal: symmetric strength and muscle tone throughout. Neurologic: Cranial nerves II through XII are grossly intact. No obvious focalities. Speech is fluent. Coordination is intact. Psychiatric: Judgment and insight are intact. Affect is appropriate. Pelvic exam not performed in light of recent surgery.   ECOG = 2  0 - Asymptomatic (Fully active, able to carry on all predisease activities without  restriction)  1 - Symptomatic but completely ambulatory (Restricted in physically strenuous activity but ambulatory and able to carry out work of a light or sedentary nature. For example, light housework, office work)  2 - Symptomatic, <50% in bed during the day (Ambulatory and capable of all self care but unable to carry out any work activities. Up and about more than 50% of waking hours)  3 - Symptomatic, >50% in bed, but not bedbound (Capable of only limited self-care, confined to bed or chair 50% or more of waking hours)  4 - Bedbound (Completely disabled. Cannot carry on any self-care. Totally confined to  bed or chair)  5 - Death   Eustace Pen MM, Creech RH, Tormey DC, et al. [phone]). "Toxicity and response criteria of the [facility]". [place] Oncol. 5 (6): 649-55  LABORATORY DATA:  Lab Results  Component Value Date   WBC 5.3 01/05/2019   HGB 8.2 (L) 01/05/2019   HCT 27.2 (L) 01/05/2019   MCV 101.5 (H) 01/05/2019   PLT 176 01/05/2019   NEUTROABS 2,832 05/28/2015   Lab Results  Component Value Date   NA 140 01/10/2019   K 3.9 01/10/2019   CL 107 (H) 01/10/2019   CO2 20 01/10/2019   GLUCOSE 72 01/10/2019   CREATININE 0.81 01/10/2019   CALCIUM 8.9 01/10/2019      RADIOGRAPHY: DG Cystogram  Result Date: 01/04/2019 CLINICAL DATA:  Post hysterectomy evaluation for possible bladder injury. EXAM: CYSTOGRAM TECHNIQUE: After catheterization of the urinary bladder following sterile technique the bladder was filled with 250 mL Cysto-Hypaque 30% by drip infusion. Serial spot images were obtained during bladder filling and post draining. FLUOROSCOPY TIME:  Fluoroscopy Time:  1 minutes 0 seconds. Radiation Exposure Index (if provided by the fluoroscopic device): 90 mGy Number of Acquired Spot Images: 9 COMPARISON:  None. FINDINGS: Examination demonstrates the bladder to be normal in size, shape and position with normal contour. There is no evidence of contrast  extravasation/leak. Remainder of exam is unremarkable. IMPRESSION: Normal cystogram without evidence of bladder injury/leak. Electronically Signed   By: Marin Olp M.D.   On: 01/04/2019 11:32   US RENAL  Result Date: 01/03/2019 CLINICAL DATA:  Acute kidney injury EXAM: RENAL / URINARY TRACT ULTRASOUND COMPLETE COMPARISON:  CT 12/05/2018 FINDINGS: Right Kidney: Renal measurements: 10.5 x 4.2 x 5.8 cm = volume: 122 mL . Echogenicity within normal limits. No mass or hydronephrosis visualized. Left Kidney: Renal measurements: 11.3 x 6.0 x 4.8 cm = volume: 171 mL. Echogenicity within normal limits. No mass or hydronephrosis visualized. Bladder: Appears normal for degree of bladder distention. Other: None. IMPRESSION: No acute findings.  No hydronephrosis.  Normal echotexture. Electronically Signed   By: Rolm Baptise M.D.   On: 01/03/2019 09:28      IMPRESSION: Stage II endometrial cancer, high-grade  Patient will be risk for recurrence and adjuvant therapy is recommended.  The patient's case was discussed at the multidisciplinary gynecologic oncology conference and recommendations are for adjuvant chemotherapy and vaginal brachytherapy. If the Patient does not tolerate chemotherapy well then recommendations were for whole pelvic radiation therapy followed by vaginal brachytherapy treatments.  Today I discussed the course of treatment with brachytherapy side effects and potential long-term toxicities.  The patient appears to understand and wishes to proceed with brachytherapy as part of her overall management.  Patient will proceed with 1-2 cycles of chemotherapy.  If she is tolerating this well will then likely proceed with her vaginal brachytherapy during her chemotherapy so that she can complete her adjuvant therapy in a more timely fashion.    PLAN: Patient will proceed with adjuvant chemotherapy in the near future.  She will likely proceed with vaginal brachytherapy starting during  her second or  third cycle of chemotherapy.    ------------------------------------------------  Blair Promise, PhD, MD  This document serves as a record of services personally performed by Gery Pray, MD. It was created on his behalf by Clerance Lav, a trained medical scribe. The creation of this record is based on the scribe's personal observations and the provider's statements to them. This document has been checked  and approved by the attending provider.

## 2019-01-22 ENCOUNTER — Encounter: Payer: Self-pay | Admitting: Oncology

## 2019-01-22 ENCOUNTER — Encounter: Payer: Self-pay | Admitting: Gynecologic Oncology

## 2019-01-22 ENCOUNTER — Other Ambulatory Visit: Payer: Self-pay

## 2019-01-22 ENCOUNTER — Inpatient Hospital Stay (HOSPITAL_BASED_OUTPATIENT_CLINIC_OR_DEPARTMENT_OTHER): Payer: Medicare Other | Admitting: Gynecologic Oncology

## 2019-01-22 ENCOUNTER — Ambulatory Visit: Payer: Medicare HMO | Admitting: Gynecologic Oncology

## 2019-01-22 VITALS — BP 125/58 | HR 64 | Temp 98.0°F | Resp 18 | Ht 66.0 in | Wt 225.0 lb

## 2019-01-22 DIAGNOSIS — C541 Malignant neoplasm of endometrium: Secondary | ICD-10-CM

## 2019-01-22 DIAGNOSIS — Z90710 Acquired absence of both cervix and uterus: Secondary | ICD-10-CM

## 2019-01-22 DIAGNOSIS — Z90722 Acquired absence of ovaries, bilateral: Secondary | ICD-10-CM

## 2019-01-22 NOTE — Progress Notes (Signed)
GYN Location of Tumor / Histology: Endometrial cancer [facility])  [name] presented with symptoms of: The patient started having postmenopausal bleeding in mid September of this year.  This was the first postmenopausal bleeding that she has had since going through menopause at age 64.  She has been using 1 pad a day which she describes as being half saturated at most.  She has had daily bleeding.  She saw her primary care provider in mid September shortly after starting to bleed.  Unfortunately, the patient was not seen by gynecology for assessment of her postmenopausal bleeding until November 10.  She received multiple phone calls as well as letter stating that she needed to make an appointment but procrastinated making this appointment.  She denies any cramping or pain.  She denies vaginal discharge.  Her bleeding has remained unchanged since the biopsy.  She was started on Megace to help control bleeding but has not noticed a significant change.   Biopsies revealed: 01/02/19: FINAL MICROSCOPIC DIAGNOSIS:   A. UTERUS, CERVIX, BILATERAL FALLOPIAN TUBES, OVARIES, RESECTION:  - Uterus:    Endomyometrium: Poorly differentiated carcinoma with clear cell  features,  spanning 3.5 cm, see comment.       Tumor limited to upper half of myometrium.       Leiomyoma.       See oncology table.    Serosa: Fibrous adhesions with endosalpingiosis. No malignancy.  - Cervix: Stroma involved by tumor.  - Bilateral ovaries: Inclusion cysts. No malignancy.  - Bilateral fallopian tubes: Unremarkable. No malignancy.   B. LYMPH NODE, RIGHT OBTURATOR, SENTINEL, BIOPSY:  - One of one lymph nodes negative for carcinoma (0/1).   C. LYMPH NODE, LEFT EXTERNAL ILIAC, SENTINEL, BIOPSY:  - One of one lymph nodes negative for carcinoma (0/1).   D. LYMPH NODE, LEFT EXTERNAL ILIAC, BIOPSY:  - One of one lymph nodes negative for carcinoma (0/1).   ONCOLOGY TABLE:   UTERUS, CARCINOMA OR CARCINOSARCOMA     Past/Anticipated interventions by Gyn/Onc surgery, if any: 01/02/19: Operation: Robotic-assisted laparoscopic total hysterectomy with bilateral salpingoophorectomy, SLN biopsy  Surgeon: Dr. Jeral Pinch  Past/Anticipated interventions by medical oncology, if any: Per Dr. Alvy Bimler 01/18/19:  ASSESSMENT & PLAN:  Endometrial cancer ([place]) We reviewed the NCCN guidelines We discussed the role of chemotherapy. The intent is of curative intent.  We discussed some of the risks, benefits, side-effects of carboplatin & Taxol. Treatment is intravenous, every 3 weeks x 6 cycles  Some of the short term side-effects included, though not limited to, including weight loss, life threatening infections, risk of allergic reactions, need for transfusions of blood products, nausea, vomiting, change in bowel habits, loss of hair, admission to hospital for various reasons, and risks of death.   Long term side-effects are also discussed including risks of infertility, permanent damage to nerve function, hearing loss, chronic fatigue, kidney damage with possibility needing hemodialysis, and rare secondary malignancy including bone marrow disorders.  The patient is aware that the response rates discussed earlier is not guaranteed.  After a long discussion, patient made an informed decision to proceed with the prescribed plan of care.   Patient education material was dispensed. We discussed premedication with dexamethasone before chemotherapy. She has reasonable venous access.  I do not plan upfront port placement I recommend chemo education class today I do not recommend G-CSF support upfront Due to high calculated dose of treatment, I plan to keep maximum dose of carboplatin at 750 mg and to reduce upfront dose  of Taxol to 140 mg per metered square I will see her again prior to cycle 1 of treatment, currently planned around January 25th  Weight changes, if any:  Wt Readings from Last 3 Encounters:   01/23/19 225 lb (102.1 kg)  01/22/19 225 lb (102.1 kg)  01/18/19 230 lb 12.8 oz (104.7 kg)     Bowel/Bladder complaints, if any: denies dysuria/hematuria. Denies vaginal bleeding/discharge. Denies rectal bleeding, diarrhea/constipation.  Nausea/Vomiting, if any: Denies abdominal bloating, N/V.  Pain issues, if any:  Denies c/o pain.  SAFETY ISSUES:  Prior radiation? no  Pacemaker/ICD? no  Possible current pregnancy? no  Is the patient on methotrexate? no  Current Complaints / other details:  Pt presents today for initial consult with Dr. Sondra Come for Radiation Oncology. Pt has PMH of mitral valve replacement, NO pacemaker/ICD.  BP (!) 158/73 (BP Location: Right Arm, Patient Position: Sitting)   Pulse 64   Temp (!) 97.2 F (36.2 C) (Temporal)   Resp 18   Ht 5\' 6"  (1.676 m)   Wt 225 lb (102.1 kg)   SpO2 100%   BMI 36.32 kg/m   Loma Sousa, RN BSN

## 2019-01-22 NOTE — Patient Instructions (Signed)
It was a pleasure seeing you today.  Remember that your lifting restrictions are in place for 6 weeks and nothing in the vagina for 8 weeks after surgery.  If you have any bleeding or other concerns, please call our clinic at [phone].  I will plan to see you once you are finished with your chemotherapy and radiation treatment.

## 2019-01-23 ENCOUNTER — Ambulatory Visit
Admission: RE | Admit: 2019-01-23 | Discharge: 2019-01-23 | Disposition: A | Discharge status: Home / Self Care | Payer: Medicare Other | Source: Ambulatory Visit | Attending: Radiation Oncology | Admitting: Radiation Oncology

## 2019-01-23 ENCOUNTER — Encounter: Payer: Self-pay | Admitting: Radiation Oncology

## 2019-01-23 ENCOUNTER — Other Ambulatory Visit: Payer: Self-pay

## 2019-01-23 VITALS — BP 158/73 | HR 64 | Temp 97.2°F | Resp 18 | Ht 66.0 in | Wt 225.0 lb

## 2019-01-23 DIAGNOSIS — I700 Atherosclerosis of aorta: Secondary | ICD-10-CM | POA: Insufficient documentation

## 2019-01-23 DIAGNOSIS — J45909 Unspecified asthma, uncomplicated: Secondary | ICD-10-CM | POA: Insufficient documentation

## 2019-01-23 DIAGNOSIS — G4730 Sleep apnea, unspecified: Secondary | ICD-10-CM | POA: Diagnosis not present

## 2019-01-23 DIAGNOSIS — C541 Malignant neoplasm of endometrium: Secondary | ICD-10-CM

## 2019-01-23 DIAGNOSIS — E119 Type 2 diabetes mellitus without complications: Secondary | ICD-10-CM | POA: Insufficient documentation

## 2019-01-23 DIAGNOSIS — I110 Hypertensive heart disease with heart failure: Secondary | ICD-10-CM | POA: Diagnosis not present

## 2019-01-23 DIAGNOSIS — I4891 Unspecified atrial fibrillation: Secondary | ICD-10-CM | POA: Diagnosis not present

## 2019-01-23 DIAGNOSIS — N3289 Other specified disorders of bladder: Secondary | ICD-10-CM | POA: Insufficient documentation

## 2019-01-23 DIAGNOSIS — Z8673 Personal history of transient ischemic attack (TIA), and cerebral infarction without residual deficits: Secondary | ICD-10-CM | POA: Insufficient documentation

## 2019-01-23 DIAGNOSIS — Z79899 Other long term (current) drug therapy: Secondary | ICD-10-CM | POA: Diagnosis not present

## 2019-01-23 DIAGNOSIS — R590 Localized enlarged lymph nodes: Secondary | ICD-10-CM | POA: Insufficient documentation

## 2019-01-23 DIAGNOSIS — Z794 Long term (current) use of insulin: Secondary | ICD-10-CM | POA: Insufficient documentation

## 2019-01-23 DIAGNOSIS — Z7901 Long term (current) use of anticoagulants: Secondary | ICD-10-CM | POA: Diagnosis not present

## 2019-01-23 DIAGNOSIS — E7800 Pure hypercholesterolemia, unspecified: Secondary | ICD-10-CM | POA: Diagnosis not present

## 2019-01-23 DIAGNOSIS — E669 Obesity, unspecified: Secondary | ICD-10-CM | POA: Diagnosis not present

## 2019-01-23 DIAGNOSIS — I340 Nonrheumatic mitral (valve) insufficiency: Secondary | ICD-10-CM | POA: Insufficient documentation

## 2019-01-23 NOTE — Patient Instructions (Signed)
Coronavirus (COVID-19) Are you at risk?  Are you at risk for the Coronavirus (COVID-19)?  To be considered HIGH RISK for Coronavirus (COVID-19), you have to meet the following criteria:  . Traveled to [place], [place], [place], [place] or [place]; or in the [place] to [place], [place], [place], or [place]; and have fever, cough, and shortness of breath within the last 2 weeks of travel OR . Been in close contact with a person diagnosed with COVID-19 within the last 2 weeks and have fever, cough, and shortness of breath . IF YOU DO NOT MEET THESE CRITERIA, YOU ARE CONSIDERED LOW RISK FOR COVID-19.  What to do if you are HIGH RISK for COVID-19?  . If you are having a medical emergency, call 911. . Seek medical care right away. Before you go to a doctor's office, urgent care or emergency department, call ahead and tell them about your recent travel, contact with someone diagnosed with COVID-19, and your symptoms. You should receive instructions from your physician's office regarding next steps of care.  . When you arrive at healthcare provider, tell the healthcare staff immediately you have returned from visiting [place], [place], [place], [place] or [place]; or traveled in the [place] to [place], [place], [place], or [place]; in the last two weeks or you have been in close contact with a person diagnosed with COVID-19 in the last 2 weeks.   . Tell the health care staff about your symptoms: fever, cough and shortness of breath. . After you have been seen by a medical provider, you will be either: o Tested for (COVID-19) and discharged home on quarantine except to seek medical care if symptoms worsen, and asked to  - Stay home and avoid contact with others until you get your results (4-5 days)  - Avoid travel on public transportation if possible (such as bus, train, or airplane) or o Sent to the Emergency Department by EMS for evaluation, COVID-19 testing, and possible  admission depending on your condition and test results.  What to do if you are LOW RISK for COVID-19?  Reduce your risk of any infection by using the same precautions used for avoiding the common cold or flu:  . Wash your hands often with soap and warm water for at least 20 seconds.  If soap and water are not readily available, use an alcohol-based hand sanitizer with at least 60% alcohol.  . If coughing or sneezing, cover your mouth and nose by coughing or sneezing into the elbow areas of your shirt or coat, into a tissue or into your sleeve (not your hands). . Avoid shaking hands with others and consider head nods or verbal greetings only. . Avoid touching your eyes, nose, or mouth with unwashed hands.  . Avoid close contact with people who are sick. . Avoid places or events with large numbers of people in one location, like concerts or sporting events. . Carefully consider travel plans you have or are making. . If you are planning any travel outside or inside the [place], visit the CDC's Travelers' Health webpage for the latest health notices. . If you have some symptoms but not all symptoms, continue to monitor at home and seek medical attention if your symptoms worsen. . If you are having a medical emergency, call 911.   ADDITIONAL HEALTHCARE OPTIONS FOR PATIENTS   Telehealth / e-Visit: [email or web address]         [facility]: [phone]  [facility]: [phone]                   [facility]: [phone]   

## 2019-01-31 ENCOUNTER — Other Ambulatory Visit: Payer: Self-pay

## 2019-01-31 ENCOUNTER — Ambulatory Visit (INDEPENDENT_AMBULATORY_CARE_PROVIDER_SITE_OTHER): Payer: Medicare Other | Admitting: *Deleted

## 2019-01-31 DIAGNOSIS — I059 Rheumatic mitral valve disease, unspecified: Secondary | ICD-10-CM | POA: Diagnosis not present

## 2019-01-31 DIAGNOSIS — Z5181 Encounter for therapeutic drug level monitoring: Secondary | ICD-10-CM

## 2019-01-31 LAB — POCT INR: INR: 1.3 — AB (ref 2.0–3.0)

## 2019-01-31 NOTE — Patient Instructions (Addendum)
Description   Take 1.5 tablets today and 2 tablets tomorrow, then continue taking 1 tablet daily except 1.5 tablets on Mondays,  Wednesdays, and Fridays. Recheck INR in 1 week. Call us with any medication changes or concerns to [facility] @ [phone].

## 2019-02-04 ENCOUNTER — Inpatient Hospital Stay: Payer: Medicare Other

## 2019-02-04 ENCOUNTER — Inpatient Hospital Stay (HOSPITAL_BASED_OUTPATIENT_CLINIC_OR_DEPARTMENT_OTHER): Payer: Medicare Other | Admitting: Hematology and Oncology

## 2019-02-04 ENCOUNTER — Other Ambulatory Visit: Payer: Self-pay

## 2019-02-04 ENCOUNTER — Other Ambulatory Visit: Payer: Self-pay | Admitting: Hematology and Oncology

## 2019-02-04 ENCOUNTER — Encounter: Payer: Self-pay | Admitting: Hematology and Oncology

## 2019-02-04 VITALS — BP 158/82 | HR 57 | Temp 98.3°F | Resp 18

## 2019-02-04 DIAGNOSIS — N183 Chronic kidney disease, stage 3 unspecified: Secondary | ICD-10-CM | POA: Diagnosis not present

## 2019-02-04 DIAGNOSIS — C541 Malignant neoplasm of endometrium: Secondary | ICD-10-CM

## 2019-02-04 DIAGNOSIS — Z5111 Encounter for antineoplastic chemotherapy: Secondary | ICD-10-CM | POA: Diagnosis not present

## 2019-02-04 DIAGNOSIS — I5033 Acute on chronic diastolic (congestive) heart failure: Secondary | ICD-10-CM | POA: Diagnosis not present

## 2019-02-04 DIAGNOSIS — D500 Iron deficiency anemia secondary to blood loss (chronic): Secondary | ICD-10-CM

## 2019-02-04 DIAGNOSIS — N1831 Chronic kidney disease, stage 3a: Secondary | ICD-10-CM | POA: Insufficient documentation

## 2019-02-04 DIAGNOSIS — E1169 Type 2 diabetes mellitus with other specified complication: Secondary | ICD-10-CM

## 2019-02-04 LAB — CMP (CANCER CENTER ONLY)
ALT: 14 U/L (ref 0–44)
AST: 18 U/L (ref 15–41)
Albumin: 4 g/dL (ref 3.5–5.0)
Alkaline Phosphatase: 87 U/L (ref 38–126)
Anion gap: 10 (ref 5–15)
BUN: 25 mg/dL — ABNORMAL HIGH (ref 8–23)
CO2: 23 mmol/L (ref 22–32)
Calcium: 9.3 mg/dL (ref 8.9–10.3)
Chloride: 106 mmol/L (ref 98–111)
Creatinine: 1.65 mg/dL — ABNORMAL HIGH (ref 0.44–1.00)
GFR, Est AFR Am: 38 mL/min — ABNORMAL LOW (ref >60)
GFR, Estimated: 32 mL/min — ABNORMAL LOW (ref >60)
Glucose, Bld: 146 mg/dL — ABNORMAL HIGH (ref 70–99)
Potassium: 4.4 mmol/L (ref 3.5–5.1)
Sodium: 139 mmol/L (ref 135–145)
Total Bilirubin: 0.6 mg/dL (ref 0.3–1.2)
Total Protein: 7.6 g/dL (ref 6.5–8.1)

## 2019-02-04 LAB — CBC WITH DIFFERENTIAL (CANCER CENTER ONLY)
Abs Immature Granulocytes: 0.02 10*3/uL (ref 0.00–0.07)
Basophils Absolute: 0 10*3/uL (ref 0.0–0.1)
Basophils Relative: 1 %
Eosinophils Absolute: 0.2 10*3/uL (ref 0.0–0.5)
Eosinophils Relative: 3 %
HCT: 33.2 % — ABNORMAL LOW (ref 36.0–46.0)
Hemoglobin: 10.6 g/dL — ABNORMAL LOW (ref 12.0–15.0)
Immature Granulocytes: 0 %
Lymphocytes Relative: 20 %
Lymphs Abs: 1.3 10*3/uL (ref 0.7–4.0)
MCH: 31.8 pg (ref 26.0–34.0)
MCHC: 31.9 g/dL (ref 30.0–36.0)
MCV: 99.7 fL (ref 80.0–100.0)
Monocytes Absolute: 0.5 10*3/uL (ref 0.1–1.0)
Monocytes Relative: 7 %
Neutro Abs: 4.5 10*3/uL (ref 1.7–7.7)
Neutrophils Relative %: 69 %
Platelet Count: 191 10*3/uL (ref 150–400)
RBC: 3.33 MIL/uL — ABNORMAL LOW (ref 3.87–5.11)
RDW: 13.8 % (ref 11.5–15.5)
WBC Count: 6.6 10*3/uL (ref 4.0–10.5)
nRBC: 0 % (ref 0.0–0.2)

## 2019-02-04 LAB — IRON AND TIBC
Iron: 72 ug/dL (ref 41–142)
Saturation Ratios: 27 % (ref 21–57)
TIBC: 269 ug/dL (ref 236–444)
UIBC: 196 ug/dL (ref 120–384)

## 2019-02-04 LAB — FERRITIN: Ferritin: 187 ng/mL (ref 11–307)

## 2019-02-04 MED ORDER — SODIUM CHLORIDE 0.9 % IV SOLN
140.0000 mg/m2 | Freq: Once | INTRAVENOUS | Status: AC
  Administered 2019-02-04: 312 mg via INTRAVENOUS
  Filled 2019-02-04: qty 52

## 2019-02-04 MED ORDER — PALONOSETRON HCL INJECTION 0.25 MG/5ML
INTRAVENOUS | Status: AC
  Filled 2019-02-04: qty 5

## 2019-02-04 MED ORDER — PALONOSETRON HCL INJECTION 0.25 MG/5ML
0.2500 mg | Freq: Once | INTRAVENOUS | Status: AC
  Administered 2019-02-04: 0.25 mg via INTRAVENOUS

## 2019-02-04 MED ORDER — SODIUM CHLORIDE 0.9 % IV SOLN
150.0000 mg | Freq: Once | INTRAVENOUS | Status: AC
  Administered 2019-02-04: 150 mg via INTRAVENOUS
  Filled 2019-02-04: qty 5

## 2019-02-04 MED ORDER — FAMOTIDINE IN NACL 20-0.9 MG/50ML-% IV SOLN
INTRAVENOUS | Status: AC
  Filled 2019-02-04: qty 50

## 2019-02-04 MED ORDER — SODIUM CHLORIDE 0.9 % IV SOLN
491.4000 mg | Freq: Once | INTRAVENOUS | Status: AC
  Administered 2019-02-04: 490 mg via INTRAVENOUS
  Filled 2019-02-04: qty 49

## 2019-02-04 MED ORDER — DEXAMETHASONE SODIUM PHOSPHATE 10 MG/ML IJ SOLN
INTRAMUSCULAR | Status: AC
  Filled 2019-02-04: qty 1

## 2019-02-04 MED ORDER — FAMOTIDINE IN NACL 20-0.9 MG/50ML-% IV SOLN
20.0000 mg | Freq: Once | INTRAVENOUS | Status: AC
  Administered 2019-02-04: 20 mg via INTRAVENOUS

## 2019-02-04 MED ORDER — DEXAMETHASONE SODIUM PHOSPHATE 10 MG/ML IJ SOLN
10.0000 mg | Freq: Once | INTRAMUSCULAR | Status: AC
  Administered 2019-02-04: 10 mg via INTRAVENOUS

## 2019-02-04 MED ORDER — DIPHENHYDRAMINE HCL 50 MG/ML IJ SOLN
INTRAMUSCULAR | Status: AC
  Filled 2019-02-04: qty 1

## 2019-02-04 MED ORDER — SODIUM CHLORIDE 0.9 % IV SOLN
Freq: Once | INTRAVENOUS | Status: AC
  Filled 2019-02-04: qty 250

## 2019-02-04 MED ORDER — SODIUM CHLORIDE 0.9 % IV SOLN: 10.0000 mg | Freq: Once | INTRAVENOUS | Status: DC

## 2019-02-04 MED ORDER — DIPHENHYDRAMINE HCL 50 MG/ML IJ SOLN
50.0000 mg | Freq: Once | INTRAMUSCULAR | Status: AC
  Administered 2019-02-04: 50 mg via INTRAVENOUS

## 2019-02-04 NOTE — Assessment & Plan Note (Signed)
The patient forgot to take her premedication oral dexamethasone I reminded her to take it before her next visit At the time of dictation, she was noted to have elevated serum creatinine We will proceed with dose adjustment She is informed to drink adequate fluid intake over the next few days after treatment to avoid kidney injury

## 2019-02-04 NOTE — Assessment & Plan Note (Signed)
She has mild elevated creatinine intermittently I suspect she had mild chronic kidney disease stage III I will adjust the dose of carboplatin today She is reminded about the importance of oral fluid intake

## 2019-02-04 NOTE — Assessment & Plan Note (Signed)
Clinically, she is euvolemic with no signs or symptoms of congestive heart failure She will continue medical management She is aware of risk of fluid retention while on treatment and we discussed importance of aggressive dietary modification

## 2019-02-04 NOTE — Progress Notes (Signed)
[facility] OFFICE PROGRESS NOTE  Patient Care Team: Lennie Odor, PA-C as PCP - General (Nurse Practitioner) Jettie Booze, MD as PCP - Cardiology (Cardiology) Kelton Pillar, MD as Attending Physician (Family Medicine) Jettie Booze, MD (Cardiology) Jettie Booze, MD as Attending Physician (Cardiology)  ASSESSMENT & PLAN:  Endometrial cancer [facility]) The patient forgot to take her premedication oral dexamethasone I reminded her to take it before her next visit At the time of dictation, she was noted to have elevated serum creatinine We will proceed with dose adjustment She is informed to drink adequate fluid intake over the next few days after treatment to avoid kidney injury  CKD (chronic kidney disease) stage 3, GFR 30-59 ml/min She has mild elevated creatinine intermittently I suspect she had mild chronic kidney disease stage III I will adjust the dose of carboplatin today She is reminded about the importance of oral fluid intake  Acute on chronic diastolic CHF (congestive heart failure) ([place]) Clinically, she is euvolemic with no signs or symptoms of congestive heart failure She will continue medical management She is aware of risk of fluid retention while on treatment and we discussed importance of aggressive dietary modification  Diabetes mellitus ([place]) We discussed the risk of worsening hyperglycemia while on treatment due to corticosteroid therapy We discussed importance of dietary modification and close follow-up with her primary care doctor for medical management   No orders of the defined types were placed in this encounter.   All questions were answered. The patient knows to call the clinic with any problems, questions or concerns. The total time spent in the appointment was 20 minutes encounter with patients including review of chart and various tests results, discussions about plan of care and coordination of care plan   Heath Lark, MD 02/04/2019 11:11 AM  INTERVAL HISTORY: Please see below for problem oriented charting. She returns to be seen prior to cycle 1 of treatment She forgot to take premedication dexamethasone yesterday Otherwise, she feels well She denies abdominal pain or changes in bowel habits Her blood sugar at home is fine She has no recent signs or symptoms of congestive heart failure  SUMMARY OF ONCOLOGIC HISTORY: Oncology History Overview Note  Clear cell features MSI stable   Endometrial cancer ([place])  09/24/2018 Initial Diagnosis   She presented with post menopausal bleeding   11/20/2018 Initial Biopsy   EMB 11/10: G3 carcinoma with clear cell features.  Pap 11/10: AGC    12/03/2018 Surgery   TRH/BSO, bil SLNs   12/05/2018 Imaging   CT C/A/P:  1. Low attenuation mass or fluid in the endometrial cavity (series 2, image 110), in keeping with known endometrial malignancy. 2. Numerous small bilateral ground-glass pulmonary nodules. Although nonspecific these are likely infectious or inflammatory given appearance, isolated manifestation of metastatic disease much less favored. 3. Enlarged mediastinal and hilar lymph nodes, likely reactive given pulmonary findings. 4. No evidence of metastatic disease in the abdomen or pelvis. 5. Nonobstructive right nephrolithiasis. 6. Aortic Atherosclerosis (ICD10-I70.0).   01/02/2019 Pathologic Stage   Stage II Gr3 (clear cell features), no LVSI, cervial stroma involved, <50% MI  A. 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            Tumor limited to upper half of myometrium.            Leiomyoma.  See oncology table.       Serosa: Fibrous adhesions with endosalpingiosis. No malignancy.  - Cervix: Stroma involved by tumor.  - Bilateral ovaries: Inclusion cysts. No malignancy.  - Bilateral  fallopian tubes: Unremarkable. No malignancy.   B. LYMPH NODE, RIGHT OBTURATOR, SENTINEL, BIOPSY:  - One of one lymph nodes negative for carcinoma (0/1).   C. LYMPH NODE, LEFT EXTERNAL ILIAC, SENTINEL, BIOPSY:  - One of one lymph nodes negative for carcinoma (0/1).   D. LYMPH NODE, LEFT EXTERNAL ILIAC, BIOPSY:  - One of one lymph nodes negative for carcinoma (0/1).   ONCOLOGY TABLE:   UTERUS, CARCINOMA OR CARCINOSARCOMA   Procedure: Total hysterectomy and bilateral salpingo-oophorectomy  Histologic type: Poorly differentiated carcinoma with clear cell  features, see comment.  Histologic Grade: High-grade  Myometrial invasion:       Depth of invasion:9 mm       Myometrial thickness: 20 mm  Uterine Serosa Involvement: Not identified  Cervical stromal involvement: Present  Extent of involvement of other organs: Not identified  Lymphovascular invasion: Not identified  Regional Lymph Nodes:       Examined:     3 Sentinel                               0 non-sentinel                               3 total        Lymph nodes with metastasis: 0        Isolated tumor cells (<0.2 mm): 0        Micrometastasis:  (>0.2 mm and < 2.0 mm): 0        Macrometastasis: (>2.0 mm): 0        Extracapsular extension: Not applicable  Representative Tumor Block: A5  MMR / MSI testing: Will be ordered  Pathologic Stage Classification (pTNM, AJCC 8th edition):  pT2, pNX  Comments: The tumor consists of solid sheets and more tubulocystic areas with focal areas of cytoplasmic clearing. Immunohistochemistry is positive for racemase (weak), Napsin-A (focal), and p53 (wild type). ER and PR are negative. While extensive clear cell changes are not seen in the more solid components, the immunoprofile along with more definitive areas of clear cell changes favor the overall tumor to be a clear cell carcinoma. The tumor has a more pushing type of invasion. There is stromal invasion in areas of endocervical mucosa.  Immunohistochemistry on the serosa adhesions reveals mesothelial cells (calretinin) and a small focus of endosalpingiosis (PAX-8, ER, MOC31). Pancytokeratin on the lymph nodes is negative.    01/18/2019 Cancer Staging   Staging form: Corpus Uteri - Carcinoma and Carcinosarcoma, AJCC 8th Edition - Pathologic: Stage II (pT2, pN0, cM0) - Signed by Heath Lark, MD on 01/18/2019   02/04/2019 -  Chemotherapy   The patient had palonosetron (ALOXI) injection 0.25 mg, 0.25 mg, Intravenous,  Once, 1 of 6 cycles CARBOplatin (PARAPLATIN) 490 mg in sodium chloride 0.9 % 250 mL chemo infusion, 490 mg (65.5 % of original dose 750 mg), Intravenous,  Once, 1 of 6 cycles Dose modification: 750 mg (original dose 750 mg, Cycle 1, Reason: Provider Judgment), 491.4 mg (original dose 750 mg, Cycle 1, Reason: Change in SCr/CrCl) fosaprepitant (EMEND) 150 mg in sodium chloride 0.9 % 145 mL IVPB, 150 mg, Intravenous,  Once, 1 of 6 cycles PACLitaxel (  TAXOL) 312 mg in sodium chloride 0.9 % 500 mL chemo infusion (> 44m/m2), 140 mg/m2 = 312 mg (80 % of original dose 175 mg/m2), Intravenous,  Once, 1 of 6 cycles Dose modification: 140 mg/m2 (80 % of original dose 175 mg/m2, Cycle 1, Reason: Provider Judgment)  for chemotherapy treatment.      REVIEW OF SYSTEMS:   Constitutional: Denies fevers, chills or abnormal weight loss Eyes: Denies blurriness of vision Ears, nose, mouth, throat, and face: Denies mucositis or sore throat Respiratory: Denies cough, dyspnea or wheezes Cardiovascular: Denies palpitation, chest discomfort or lower extremity swelling Gastrointestinal:  Denies nausea, heartburn or change in bowel habits Skin: Denies abnormal skin rashes Lymphatics: Denies new lymphadenopathy or easy bruising Neurological:Denies numbness, tingling or new weaknesses Behavioral/Psych: Mood is stable, no new changes  All other systems were reviewed with the patient and are negative.  I have reviewed the past medical history,  past surgical history, social history and family history with the patient and they are unchanged from previous note.  ALLERGIES:  is allergic to chlorhexidine.  MEDICATIONS:  Current Outpatient Medications  Medication Sig Dispense Refill  . amiodarone (PACERONE) 200 MG tablet TAKE 1 TABLET(200 MG) BY MOUTH DAILY (Patient taking differently: Take 200 mg by mouth daily. ) 90 tablet 3  . amLODipine (NORVASC) 5 MG tablet Take 1 tablet (5 mg total) by mouth daily. 180 tablet 3  . atorvastatin (LIPITOR) 40 MG tablet Take 1 tablet (40 mg total) by mouth daily. 90 tablet 3  . calcium carbonate (OS-CAL - DOSED IN MG OF ELEMENTAL CALCIUM) 1250 MG tablet Take 1 tablet by mouth daily.      . carvedilol (COREG) 12.5 MG tablet Take 1 tablet (12.5 mg total) by mouth 2 (two) times daily with a meal. 180 tablet 3  . Cholecalciferol (VITAMIN D) 1000 UNITS capsule Take 1,000 Units by mouth daily.     . cyclobenzaprine (FLEXERIL) 10 MG tablet Take 10 mg by mouth daily.     .Marland Kitchendexamethasone (DECADRON) 4 MG tablet Take 2 tabs at the night before chemotherapy, every 3 weeks, by mouth x 6 cycles, please dispense 12 tabs (Patient not taking: Reported on 01/22/2019) 60 tablet 0  . insulin glargine (LANTUS SOLOSTAR) 100 UNIT/ML injection Inject 30-35 Units into the skin See admin instructions. Inject 30 units into the skin in the morning and 35 units at bedtime.    . irbesartan (AVAPRO) 300 MG tablet Take 1 tablet (300 mg total) by mouth daily. 90 tablet 3  . metFORMIN (GLUCOPHAGE) 500 MG tablet Take 500 mg by mouth 2 (two) times daily with a meal.     . methocarbamol (ROBAXIN) 500 MG tablet Take 500 mg by mouth every 8 (eight) hours as needed for muscle spasms.     . ondansetron (ZOFRAN) 8 MG tablet Take 1 tablet (8 mg total) by mouth every 8 (eight) hours as needed. 30 tablet 1  . prochlorperazine (COMPAZINE) 10 MG tablet Take 1 tablet (10 mg total) by mouth every 6 (six) hours as needed (Nausea or vomiting). 30 tablet 1   . senna (SENOKOT) 8.6 MG TABS tablet Take 1 tablet (8.6 mg total) by mouth daily as needed for up to 15 doses for mild constipation. 15 tablet 0  . warfarin (COUMADIN) 4 MG tablet Take 1 to 1.5 tablets daily as directed by [facility] 135 tablet 1   No current facility-administered medications for this visit.   Facility-Administered Medications Ordered in Other Visits  Medication Dose  Route Frequency Provider Last Rate Last Admin  . 0.9 %  sodium chloride infusion   Intravenous Once Alvy Bimler, Jian Hodgman, MD      . CARBOplatin (PARAPLATIN) 490 mg in sodium chloride 0.9 % 250 mL chemo infusion  490 mg Intravenous Once Alvy Bimler, Laurelyn Terrero, MD      . dexamethasone (DECADRON) 10 mg in sodium chloride 0.9 % 50 mL IVPB  10 mg Intravenous Once Alvy Bimler, Mayla Biddy, MD      . diphenhydrAMINE (BENADRYL) injection 50 mg  50 mg Intravenous Once Alvy Bimler, Recardo Linn, MD      . famotidine (PEPCID) IVPB 20 mg premix  20 mg Intravenous Once Alvy Bimler, Yunis Voorheis, MD      . fosaprepitant (EMEND) 150 mg in sodium chloride 0.9 % 145 mL IVPB  150 mg Intravenous Once Alvy Bimler, Barri Neidlinger, MD      . PACLitaxel (TAXOL) 312 mg in sodium chloride 0.9 % 500 mL chemo infusion (> 64m/m2)  140 mg/m2 (Treatment Plan Recorded) Intravenous Once GAlvy Bimler Mianna Iezzi, MD      . palonosetron (ALOXI) injection 0.25 mg  0.25 mg Intravenous Once GAlvy Bimler Brenner Visconti, MD        PHYSICAL EXAMINATION: ECOG PERFORMANCE STATUS: 1 - Symptomatic but completely ambulatory  Vitals:   02/04/19 1020  BP: (!) 135/59  Pulse: (!) 59  Resp: 18  Temp: 98.7 F (37.1 C)  SpO2: 100%   Filed Weights   02/04/19 1020  Weight: 226 lb 12.8 oz (102.9 kg)    GENERAL:alert, no distress and comfortable SKIN: skin color, texture, turgor are normal, no rashes or significant lesions EYES: normal, Conjunctiva are pink and non-injected, sclera clear OROPHARYNX:no exudate, no erythema and lips, buccal mucosa, and tongue normal  NECK: supple, thyroid normal size, non-tender, without nodularity LYMPH:  no palpable  lymphadenopathy in the cervical, axillary or inguinal LUNGS: clear to auscultation and percussion with normal breathing effort HEART: regular rate & rhythm with ejection systolic murmur on the left sternal border and no lower extremity edema ABDOMEN:abdomen soft, non-tender and normal bowel sounds Musculoskeletal:no cyanosis of digits and no clubbing  NEURO: alert & oriented x 3 with fluent speech, no focal motor/sensory deficits  LABORATORY DATA:  I have reviewed the data as listed    Component Value Date/Time   NA 139 02/04/2019 1003   NA 140 01/10/2019 0839   K 4.4 02/04/2019 1003   CL 106 02/04/2019 1003   CO2 23 02/04/2019 1003   GLUCOSE 146 (H) 02/04/2019 1003   BUN 25 (H) 02/04/2019 1003   BUN 12 01/10/2019 0839   CREATININE 1.65 (H) 02/04/2019 1003   CREATININE 0.67 05/28/2015 0958   CALCIUM 9.3 02/04/2019 1003   PROT 7.6 02/04/2019 1003   PROT 7.7 10/03/2016 0936   ALBUMIN 4.0 02/04/2019 1003   ALBUMIN 4.4 10/03/2016 0936   AST 18 02/04/2019 1003   ALT 14 02/04/2019 1003   ALKPHOS 87 02/04/2019 1003   BILITOT 0.6 02/04/2019 1003   GFRNONAA 32 (L) 02/04/2019 1003   GFRAA 38 (L) 02/04/2019 1003    No results found for: SPEP, UPEP  Lab Results  Component Value Date   WBC 6.6 02/04/2019   NEUTROABS 4.5 02/04/2019   HGB 10.6 (L) 02/04/2019   HCT 33.2 (L) 02/04/2019   MCV 99.7 02/04/2019   PLT 191 02/04/2019      Chemistry      Component Value Date/Time   NA 139 02/04/2019 1003   NA 140 01/10/2019 0839   K 4.4 02/04/2019 1003   CL  106 02/04/2019 1003   CO2 23 02/04/2019 1003   BUN 25 (H) 02/04/2019 1003   BUN 12 01/10/2019 0839   CREATININE 1.65 (H) 02/04/2019 1003   CREATININE 0.67 05/28/2015 0958      Component Value Date/Time   CALCIUM 9.3 02/04/2019 1003   ALKPHOS 87 02/04/2019 1003   AST 18 02/04/2019 1003   ALT 14 02/04/2019 1003   BILITOT 0.6 02/04/2019 1003

## 2019-02-04 NOTE — Assessment & Plan Note (Signed)
We discussed the risk of worsening hyperglycemia while on treatment due to corticosteroid therapy We discussed importance of dietary modification and close follow-up with her primary care doctor for medical management

## 2019-02-04 NOTE — Patient Instructions (Signed)
Paclitaxel injection What is this medicine? PACLITAXEL (PAK li TAX el) is a chemotherapy drug. It targets fast dividing cells, like cancer cells, and causes these cells to die. This medicine is used to treat ovarian cancer, breast cancer, lung cancer, Kaposi's sarcoma, and other cancers. This medicine may be used for other purposes; ask your health care provider or pharmacist if you have questions. COMMON BRAND NAME(S): Onxol, Taxol What should I tell my health care provider before I take this medicine? They need to know if you have any of these conditions:  history of irregular heartbeat  liver disease  low blood counts, like low white cell, platelet, or red cell counts  lung or breathing disease, like asthma  tingling of the fingers or toes, or other nerve disorder  an unusual or allergic reaction to paclitaxel, alcohol, polyoxyethylated castor oil, other chemotherapy, other medicines, foods, dyes, or preservatives  pregnant or trying to get pregnant  breast-feeding How should I use this medicine? This drug is given as an infusion into a vein. It is administered in a hospital or clinic by a specially trained health care professional. Talk to your pediatrician regarding the use of this medicine in children. Special care may be needed. Overdosage: If you think you have taken too much of this medicine contact a poison control center or emergency room at once. NOTE: This medicine is only for you. Do not share this medicine with others. What if I miss a dose? It is important not to miss your dose. Call your doctor or health care professional if you are unable to keep an appointment. What may interact with this medicine? Do not take this medicine with any of the following medications:  disulfiram  metronidazole This medicine may also interact with the following medications:  antiviral medicines for hepatitis, HIV or AIDS  certain antibiotics like erythromycin and  clarithromycin  certain medicines for fungal infections like ketoconazole and itraconazole  certain medicines for seizures like carbamazepine, phenobarbital, phenytoin  gemfibrozil  nefazodone  rifampin  St. John's wort This list may not describe all possible interactions. Give your health care provider a list of all the medicines, herbs, non-prescription drugs, or dietary supplements you use. Also tell them if you smoke, drink alcohol, or use illegal drugs. Some items may interact with your medicine. What should I watch for while using this medicine? Your condition will be monitored carefully while you are receiving this medicine. You will need important blood work done while you are taking this medicine. This medicine can cause serious allergic reactions. To reduce your risk you will need to take other medicine(s) before treatment with this medicine. If you experience allergic reactions like skin rash, itching or hives, swelling of the face, lips, or tongue, tell your doctor or health care professional right away. In some cases, you may be given additional medicines to help with side effects. Follow all directions for their use. This drug may make you feel generally unwell. This is not uncommon, as chemotherapy can affect healthy cells as well as cancer cells. Report any side effects. Continue your course of treatment even though you feel ill unless your doctor tells you to stop. Call your doctor or health care professional for advice if you get a fever, chills or sore throat, or other symptoms of a cold or flu. Do not treat yourself. This drug decreases your body's ability to fight infections. Try to avoid being around people who are sick. This medicine may increase your risk to bruise   or bleed. Call your doctor or health care professional if you notice any unusual bleeding. Be careful brushing and flossing your teeth or using a toothpick because you may get an infection or bleed more easily.  If you have any dental work done, tell your dentist you are receiving this medicine. Avoid taking products that contain aspirin, acetaminophen, ibuprofen, naproxen, or ketoprofen unless instructed by your doctor. These medicines may hide a fever. Do not become pregnant while taking this medicine. Women should inform their doctor if they wish to become pregnant or think they might be pregnant. There is a potential for serious side effects to an unborn child. Talk to your health care professional or pharmacist for more information. Do not breast-feed an infant while taking this medicine. Men are advised not to father a child while receiving this medicine. This product may contain alcohol. Ask your pharmacist or healthcare provider if this medicine contains alcohol. Be sure to tell all healthcare providers you are taking this medicine. Certain medicines, like metronidazole and disulfiram, can cause an unpleasant reaction when taken with alcohol. The reaction includes flushing, headache, nausea, vomiting, sweating, and increased thirst. The reaction can last from 30 minutes to several hours. What side effects may I notice from receiving this medicine? Side effects that you should report to your doctor or health care professional as soon as possible:  allergic reactions like skin rash, itching or hives, swelling of the face, lips, or tongue  breathing problems  changes in vision  fast, irregular heartbeat  high or low blood pressure  mouth sores  pain, tingling, numbness in the hands or feet  signs of decreased platelets or bleeding - bruising, pinpoint red spots on the skin, black, tarry stools, blood in the urine  signs of decreased red blood cells - unusually weak or tired, feeling faint or lightheaded, falls  signs of infection - fever or chills, cough, sore throat, pain or difficulty passing urine  signs and symptoms of liver injury like dark yellow or brown urine; general ill feeling or  flu-like symptoms; light-colored stools; loss of appetite; nausea; right upper belly pain; unusually weak or tired; yellowing of the eyes or skin  swelling of the ankles, feet, hands  unusually slow heartbeat Side effects that usually do not require medical attention (report to your doctor or health care professional if they continue or are bothersome):  diarrhea  hair loss  loss of appetite  muscle or joint pain  nausea, vomiting  pain, redness, or irritation at site where injected  tiredness This list may not describe all possible side effects. Call your doctor for medical advice about side effects. You may report side effects to FDA at 1-800-FDA-1088. Where should I keep my medicine? This drug is given in a hospital or clinic and will not be stored at home. NOTE: This sheet is a summary. It may not cover all possible information. If you have questions about this medicine, talk to your doctor, pharmacist, or health care provider.  2020 [employer]/Gold Standard (2016-08-30 13:14:55) Carboplatin injection What is this medicine? CARBOPLATIN (KAR boe pla tin) is a chemotherapy drug. It targets fast dividing cells, like cancer cells, and causes these cells to die. This medicine is used to treat ovarian cancer and many other cancers. This medicine may be used for other purposes; ask your health care provider or pharmacist if you have questions. COMMON BRAND NAME(S): Paraplatin What should I tell my health care provider before I take this medicine? They need to   know if you have any of these conditions:  blood disorders  hearing problems  kidney disease  recent or ongoing radiation therapy  an unusual or allergic reaction to carboplatin, cisplatin, other chemotherapy, other medicines, foods, dyes, or preservatives  pregnant or trying to get pregnant  breast-feeding How should I use this medicine? This drug is usually given as an infusion into a vein. It is administered in a  hospital or clinic by a specially trained health care professional. Talk to your pediatrician regarding the use of this medicine in children. Special care may be needed. Overdosage: If you think you have taken too much of this medicine contact a poison control center or emergency room at once. NOTE: This medicine is only for you. Do not share this medicine with others. What if I miss a dose? It is important not to miss a dose. Call your doctor or health care professional if you are unable to keep an appointment. What may interact with this medicine?  medicines for seizures  medicines to increase blood counts like filgrastim, pegfilgrastim, sargramostim  some antibiotics like amikacin, gentamicin, neomycin, streptomycin, tobramycin  vaccines Talk to your doctor or health care professional before taking any of these medicines:  acetaminophen  aspirin  ibuprofen  ketoprofen  naproxen This list may not describe all possible interactions. Give your health care provider a list of all the medicines, herbs, non-prescription drugs, or dietary supplements you use. Also tell them if you smoke, drink alcohol, or use illegal drugs. Some items may interact with your medicine. What should I watch for while using this medicine? Your condition will be monitored carefully while you are receiving this medicine. You will need important blood work done while you are taking this medicine. This drug may make you feel generally unwell. This is not uncommon, as chemotherapy can affect healthy cells as well as cancer cells. Report any side effects. Continue your course of treatment even though you feel ill unless your doctor tells you to stop. In some cases, you may be given additional medicines to help with side effects. Follow all directions for their use. Call your doctor or health care professional for advice if you get a fever, chills or sore throat, or other symptoms of a cold or flu. Do not treat  yourself. This drug decreases your body's ability to fight infections. Try to avoid being around people who are sick. This medicine may increase your risk to bruise or bleed. Call your doctor or health care professional if you notice any unusual bleeding. Be careful brushing and flossing your teeth or using a toothpick because you may get an infection or bleed more easily. If you have any dental work done, tell your dentist you are receiving this medicine. Avoid taking products that contain aspirin, acetaminophen, ibuprofen, naproxen, or ketoprofen unless instructed by your doctor. These medicines may hide a fever. Do not become pregnant while taking this medicine. Women should inform their doctor if they wish to become pregnant or think they might be pregnant. There is a potential for serious side effects to an unborn child. Talk to your health care professional or pharmacist for more information. Do not breast-feed an infant while taking this medicine. What side effects may I notice from receiving this medicine? Side effects that you should report to your doctor or health care professional as soon as possible:  allergic reactions like skin rash, itching or hives, swelling of the face, lips, or tongue  signs of infection - fever or   chills, cough, sore throat, pain or difficulty passing urine  signs of decreased platelets or bleeding - bruising, pinpoint red spots on the skin, black, tarry stools, nosebleeds  signs of decreased red blood cells - unusually weak or tired, fainting spells, lightheadedness  breathing problems  changes in hearing  changes in vision  chest pain  high blood pressure  low blood counts - This drug may decrease the number of white blood cells, red blood cells and platelets. You may be at increased risk for infections and bleeding.  nausea and vomiting  pain, swelling, redness or irritation at the injection site  pain, tingling, numbness in the hands or  feet  problems with balance, talking, walking  trouble passing urine or change in the amount of urine Side effects that usually do not require medical attention (report to your doctor or health care professional if they continue or are bothersome):  hair loss  loss of appetite  metallic taste in the mouth or changes in taste This list may not describe all possible side effects. Call your doctor for medical advice about side effects. You may report side effects to FDA at 1-800-FDA-1088. Where should I keep my medicine? This drug is given in a hospital or clinic and will not be stored at home. NOTE: This sheet is a summary. It may not cover all possible information. If you have questions about this medicine, talk to your doctor, pharmacist, or health care provider.  2020 [employer]/Gold Standard (2007-04-03 14:38:05)  

## 2019-02-05 ENCOUNTER — Telehealth: Payer: Self-pay | Admitting: *Deleted

## 2019-02-05 NOTE — Telephone Encounter (Signed)
Called patient to inform of [facility], spoke with patient and she is aware of these appts. 

## 2019-02-08 ENCOUNTER — Other Ambulatory Visit: Payer: Self-pay

## 2019-02-08 ENCOUNTER — Ambulatory Visit (INDEPENDENT_AMBULATORY_CARE_PROVIDER_SITE_OTHER): Payer: Medicare Other | Admitting: *Deleted

## 2019-02-08 DIAGNOSIS — I059 Rheumatic mitral valve disease, unspecified: Secondary | ICD-10-CM | POA: Diagnosis not present

## 2019-02-08 DIAGNOSIS — Z5181 Encounter for therapeutic drug level monitoring: Secondary | ICD-10-CM | POA: Diagnosis not present

## 2019-02-08 LAB — POCT INR: INR: 1 — AB (ref 2.0–3.0)

## 2019-02-08 NOTE — Patient Instructions (Signed)
Description   Take 2 tablets today and 1.5 tablets tomorrow, then start taking 1.5 tablets daily except for 1 tablet on Tuesday and Saturday. Recheck INR in 1 week. Call us with any medication changes or concerns to [facility] @ [phone].

## 2019-02-15 ENCOUNTER — Ambulatory Visit (INDEPENDENT_AMBULATORY_CARE_PROVIDER_SITE_OTHER): Payer: Medicare Other | Admitting: *Deleted

## 2019-02-15 ENCOUNTER — Other Ambulatory Visit: Payer: Self-pay

## 2019-02-15 DIAGNOSIS — I059 Rheumatic mitral valve disease, unspecified: Secondary | ICD-10-CM | POA: Diagnosis not present

## 2019-02-15 DIAGNOSIS — Z5181 Encounter for therapeutic drug level monitoring: Secondary | ICD-10-CM | POA: Diagnosis not present

## 2019-02-15 LAB — POCT INR: INR: 1.5 — AB (ref 2.0–3.0)

## 2019-02-15 NOTE — Patient Instructions (Signed)
Description   Take 2 tablets today and 2 tablets tomorrow, then start taking 1.5 tablets daily. Recheck INR in 1 week. Call us with any medication changes or concerns to [facility] @ [phone].

## 2019-02-22 ENCOUNTER — Other Ambulatory Visit: Payer: Self-pay

## 2019-02-22 ENCOUNTER — Ambulatory Visit (INDEPENDENT_AMBULATORY_CARE_PROVIDER_SITE_OTHER): Payer: Medicare Other | Admitting: *Deleted

## 2019-02-22 DIAGNOSIS — Z5181 Encounter for therapeutic drug level monitoring: Secondary | ICD-10-CM

## 2019-02-22 DIAGNOSIS — I059 Rheumatic mitral valve disease, unspecified: Secondary | ICD-10-CM

## 2019-02-22 LAB — POCT INR: INR: 2.4 (ref 2.0–3.0)

## 2019-02-22 NOTE — Patient Instructions (Signed)
Description   Take 2 tablets today then start taking 1.5 tablets daily except for 2 tablets on Tuesdays. Recheck INR in 2 weeks. Call us with any medication changes or concerns to [facility] @ [phone].

## 2019-02-25 ENCOUNTER — Inpatient Hospital Stay: Payer: Medicare Other

## 2019-02-25 ENCOUNTER — Encounter: Payer: Self-pay | Admitting: Hematology and Oncology

## 2019-02-25 ENCOUNTER — Inpatient Hospital Stay (HOSPITAL_BASED_OUTPATIENT_CLINIC_OR_DEPARTMENT_OTHER): Payer: Medicare Other | Admitting: Hematology and Oncology

## 2019-02-25 ENCOUNTER — Inpatient Hospital Stay: Payer: Medicare Other | Attending: Gynecologic Oncology

## 2019-02-25 ENCOUNTER — Other Ambulatory Visit: Payer: Self-pay

## 2019-02-25 DIAGNOSIS — C541 Malignant neoplasm of endometrium: Secondary | ICD-10-CM | POA: Diagnosis present

## 2019-02-25 DIAGNOSIS — Z5111 Encounter for antineoplastic chemotherapy: Secondary | ICD-10-CM | POA: Diagnosis present

## 2019-02-25 DIAGNOSIS — N183 Chronic kidney disease, stage 3 unspecified: Secondary | ICD-10-CM

## 2019-02-25 DIAGNOSIS — T50905A Adverse effect of unspecified drugs, medicaments and biological substances, initial encounter: Secondary | ICD-10-CM

## 2019-02-25 DIAGNOSIS — R739 Hyperglycemia, unspecified: Secondary | ICD-10-CM | POA: Diagnosis not present

## 2019-02-25 LAB — CBC WITH DIFFERENTIAL (CANCER CENTER ONLY)
Abs Immature Granulocytes: 0.09 10*3/uL — ABNORMAL HIGH (ref 0.00–0.07)
Basophils Absolute: 0.1 10*3/uL (ref 0.0–0.1)
Basophils Relative: 1 %
Eosinophils Absolute: 0.1 10*3/uL (ref 0.0–0.5)
Eosinophils Relative: 1 %
HCT: 34.4 % — ABNORMAL LOW (ref 36.0–46.0)
Hemoglobin: 11 g/dL — ABNORMAL LOW (ref 12.0–15.0)
Immature Granulocytes: 1 %
Lymphocytes Relative: 14 %
Lymphs Abs: 1 10*3/uL (ref 0.7–4.0)
MCH: 31.2 pg (ref 26.0–34.0)
MCHC: 32 g/dL (ref 30.0–36.0)
MCV: 97.5 fL (ref 80.0–100.0)
Monocytes Absolute: 0.5 10*3/uL (ref 0.1–1.0)
Monocytes Relative: 7 %
Neutro Abs: 5.5 10*3/uL (ref 1.7–7.7)
Neutrophils Relative %: 76 %
Platelet Count: 201 10*3/uL (ref 150–400)
RBC: 3.53 MIL/uL — ABNORMAL LOW (ref 3.87–5.11)
RDW: 13.2 % (ref 11.5–15.5)
WBC Count: 7.2 10*3/uL (ref 4.0–10.5)
nRBC: 0 % (ref 0.0–0.2)

## 2019-02-25 LAB — CMP (CANCER CENTER ONLY)
ALT: 16 U/L (ref 0–44)
AST: 16 U/L (ref 15–41)
Albumin: 3.9 g/dL (ref 3.5–5.0)
Alkaline Phosphatase: 95 U/L (ref 38–126)
Anion gap: 9 (ref 5–15)
BUN: 16 mg/dL (ref 8–23)
CO2: 23 mmol/L (ref 22–32)
Calcium: 8.9 mg/dL (ref 8.9–10.3)
Chloride: 105 mmol/L (ref 98–111)
Creatinine: 1.24 mg/dL — ABNORMAL HIGH (ref 0.44–1.00)
GFR, Est AFR Am: 53 mL/min — ABNORMAL LOW (ref >60)
GFR, Estimated: 46 mL/min — ABNORMAL LOW (ref >60)
Glucose, Bld: 219 mg/dL — ABNORMAL HIGH (ref 70–99)
Potassium: 4.1 mmol/L (ref 3.5–5.1)
Sodium: 137 mmol/L (ref 135–145)
Total Bilirubin: 0.5 mg/dL (ref 0.3–1.2)
Total Protein: 7.5 g/dL (ref 6.5–8.1)

## 2019-02-25 MED ORDER — DIPHENHYDRAMINE HCL 50 MG/ML IJ SOLN
INTRAMUSCULAR | Status: AC
  Filled 2019-02-25: qty 1

## 2019-02-25 MED ORDER — SODIUM CHLORIDE 0.9 % IV SOLN
491.4000 mg | Freq: Once | INTRAVENOUS | Status: AC
  Administered 2019-02-25: 490 mg via INTRAVENOUS
  Filled 2019-02-25: qty 49

## 2019-02-25 MED ORDER — DEXAMETHASONE SODIUM PHOSPHATE 10 MG/ML IJ SOLN
INTRAMUSCULAR | Status: AC
  Filled 2019-02-25: qty 1

## 2019-02-25 MED ORDER — DIPHENHYDRAMINE HCL 50 MG/ML IJ SOLN
50.0000 mg | Freq: Once | INTRAMUSCULAR | Status: AC
  Administered 2019-02-25: 50 mg via INTRAVENOUS

## 2019-02-25 MED ORDER — DEXAMETHASONE SODIUM PHOSPHATE 10 MG/ML IJ SOLN
10.0000 mg | Freq: Once | INTRAMUSCULAR | Status: AC
  Administered 2019-02-25: 10 mg via INTRAVENOUS

## 2019-02-25 MED ORDER — FAMOTIDINE IN NACL 20-0.9 MG/50ML-% IV SOLN
INTRAVENOUS | Status: AC
  Filled 2019-02-25: qty 50

## 2019-02-25 MED ORDER — PALONOSETRON HCL INJECTION 0.25 MG/5ML
0.2500 mg | Freq: Once | INTRAVENOUS | Status: AC
  Administered 2019-02-25: 0.25 mg via INTRAVENOUS

## 2019-02-25 MED ORDER — SODIUM CHLORIDE 0.9 % IV SOLN
Freq: Once | INTRAVENOUS | Status: AC
  Filled 2019-02-25: qty 250

## 2019-02-25 MED ORDER — PALONOSETRON HCL INJECTION 0.25 MG/5ML
INTRAVENOUS | Status: AC
  Filled 2019-02-25: qty 5

## 2019-02-25 MED ORDER — FAMOTIDINE IN NACL 20-0.9 MG/50ML-% IV SOLN
20.0000 mg | Freq: Once | INTRAVENOUS | Status: AC
  Administered 2019-02-25: 20 mg via INTRAVENOUS

## 2019-02-25 MED ORDER — SODIUM CHLORIDE 0.9 % IV SOLN
150.0000 mg | Freq: Once | INTRAVENOUS | Status: AC
  Administered 2019-02-25: 12:00:00 150 mg via INTRAVENOUS
  Filled 2019-02-25: qty 150

## 2019-02-25 MED ORDER — SODIUM CHLORIDE 0.9 % IV SOLN
138.0000 mg/m2 | Freq: Once | INTRAVENOUS | Status: AC
  Administered 2019-02-25: 300 mg via INTRAVENOUS
  Filled 2019-02-25: qty 50

## 2019-02-25 NOTE — Patient Instructions (Signed)
[facility] Discharge Instructions for Patients Receiving Chemotherapy  Today you received the following chemotherapy agents Taxol/Carboplatin  To help prevent nausea and vomiting after your treatment, we encourage you to take your nausea medication as directed.   If you develop nausea and vomiting that is not controlled by your nausea medication, call the clinic.   BELOW ARE SYMPTOMS THAT SHOULD BE REPORTED IMMEDIATELY:  *FEVER GREATER THAN 100.5 F  *CHILLS WITH OR WITHOUT FEVER  NAUSEA AND VOMITING THAT IS NOT CONTROLLED WITH YOUR NAUSEA MEDICATION  *UNUSUAL SHORTNESS OF BREATH  *UNUSUAL BRUISING OR BLEEDING  TENDERNESS IN MOUTH AND THROAT WITH OR WITHOUT PRESENCE OF ULCERS  *URINARY PROBLEMS  *BOWEL PROBLEMS  UNUSUAL RASH Items with * indicate a potential emergency and should be followed up as soon as possible.  Feel free to call the clinic you have any questions or concerns. The clinic phone number is [phone].    

## 2019-02-25 NOTE — Progress Notes (Signed)
[facility] OFFICE PROGRESS NOTE  Patient Care Team: Lennie Odor, [name] as PCP - General (Nurse Practitioner) Jettie Booze, MD as PCP - Cardiology (Cardiology) Kelton Pillar, MD as Attending Physician (Family Medicine) Jettie Booze, MD (Cardiology) Jettie Booze, MD as Attending Physician (Cardiology)  ASSESSMENT & PLAN:  Endometrial cancer ([place]) Overall, she tolerated treatment well Even with improvement of kidney function, I plan to keep the dose of carboplatin the same We will proceed with treatment without delay For her next cycle, I will delay her treatment by 1 day due to anticipated radiation treatment  CKD (chronic kidney disease) stage 3, GFR 30-59 ml/min She has mild elevated creatinine intermittently I suspect she had mild chronic kidney disease stage III  Drug-induced hyperglycemia She has significant elevated blood sugar likely secondary to steroid treatment For now, I plan to observe for now and do not recommend additional insulin therapy unless is over 250   No orders of the defined types were placed in this encounter.   All questions were answered. The patient knows to call the clinic with any problems, questions or concerns. The total time spent in the appointment was 20 minutes encounter with patients including review of chart and various tests results, discussions about plan of care and coordination of care plan   Heath Lark, MD 02/25/2019 11:19 AM  INTERVAL HISTORY: Please see below for problem oriented charting. She returns for further follow-up She complained of very minor tingling sensation after cycle 1 of treatment Otherwise, she is doing well No significant changes in bowel habits She has some minor bone pain but otherwise stable  SUMMARY OF ONCOLOGIC HISTORY: Oncology History Overview Note  Clear cell features MSI stable   Endometrial cancer ([place])  09/24/2018 Initial Diagnosis   She presented with post  menopausal bleeding   11/20/2018 Initial Biopsy   EMB 11/10: G3 carcinoma with clear cell features.  Pap 11/10: AGC    12/03/2018 Surgery   TRH/BSO, bil SLNs   12/05/2018 Imaging   CT C/A/P:  1. Low attenuation mass or fluid in the endometrial cavity (series 2, image 110), in keeping with known endometrial malignancy. 2. Numerous small bilateral ground-glass pulmonary nodules. Although nonspecific these are likely infectious or inflammatory given appearance, isolated manifestation of metastatic disease much less favored. 3. Enlarged mediastinal and hilar lymph nodes, likely reactive given pulmonary findings. 4. No evidence of metastatic disease in the abdomen or pelvis. 5. Nonobstructive right nephrolithiasis. 6. Aortic Atherosclerosis (ICD10-I70.0).   01/02/2019 Pathologic Stage   Stage II Gr3 (clear cell features), no LVSI, cervial stroma involved, <50% MI MSI-low  A. 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            Tumor limited to upper half of myometrium.            Leiomyoma.            See oncology table.       Serosa: Fibrous adhesions with endosalpingiosis. No malignancy.  - Cervix: Stroma involved by tumor.  - Bilateral ovaries: Inclusion cysts. No malignancy.  - Bilateral fallopian tubes: Unremarkable. No malignancy.   B. LYMPH NODE, RIGHT OBTURATOR, SENTINEL, BIOPSY:  - One of one lymph nodes negative for carcinoma (0/1).   C. LYMPH NODE, LEFT EXTERNAL ILIAC, SENTINEL, BIOPSY:  - One of one lymph nodes negative for carcinoma (0/1).  D. LYMPH NODE, LEFT EXTERNAL ILIAC, BIOPSY:  - One of one lymph nodes negative for carcinoma (0/1).   ONCOLOGY TABLE:   UTERUS, CARCINOMA OR CARCINOSARCOMA   Procedure: Total hysterectomy and bilateral salpingo-oophorectomy  Histologic type: Poorly differentiated carcinoma with clear  cell  features, see comment.  Histologic Grade: High-grade  Myometrial invasion:       Depth of invasion:9 mm       Myometrial thickness: 20 mm  Uterine Serosa Involvement: Not identified  Cervical stromal involvement: Present  Extent of involvement of other organs: Not identified  Lymphovascular invasion: Not identified  Regional Lymph Nodes:       Examined:     3 Sentinel                               0 non-sentinel                               3 total        Lymph nodes with metastasis: 0        Isolated tumor cells (<0.2 mm): 0        Micrometastasis:  (>0.2 mm and < 2.0 mm): 0        Macrometastasis: (>2.0 mm): 0        Extracapsular extension: Not applicable  Representative Tumor Block: A5  MMR / MSI testing: Will be ordered  Pathologic Stage Classification (pTNM, AJCC 8th edition):  pT2, pNX  Comments: The tumor consists of solid sheets and more tubulocystic areas with focal areas of cytoplasmic clearing. Immunohistochemistry is positive for racemase (weak), Napsin-A (focal), and p53 (wild type). ER and PR are negative. While extensive clear cell changes are not seen in the more solid components, the immunoprofile along with more definitive areas of clear cell changes favor the overall tumor to be a clear cell carcinoma. The tumor has a more pushing type of invasion. There is stromal invasion in areas of endocervical mucosa. Immunohistochemistry on the serosa adhesions reveals mesothelial cells (calretinin) and a small focus of endosalpingiosis (PAX-8, ER, MOC31). Pancytokeratin on the lymph nodes is negative.    01/18/2019 Cancer Staging   Staging form: Corpus Uteri - Carcinoma and Carcinosarcoma, AJCC 8th Edition - Pathologic: Stage II (pT2, pN0, cM0) - Signed by Heath Lark, MD on 01/18/2019   02/04/2019 -  Chemotherapy   The patient had palonosetron (ALOXI) injection 0.25 mg, 0.25 mg, Intravenous,  Once, 1 of 6 cycles Administration: 0.25 mg (02/04/2019) CARBOplatin (PARAPLATIN)  490 mg in sodium chloride 0.9 % 250 mL chemo infusion, 490 mg (65.5 % of original dose 750 mg), Intravenous,  Once, 1 of 6 cycles Dose modification: 750 mg (original dose 750 mg, Cycle 1, Reason: Provider Judgment), 491.4 mg (original dose 750 mg, Cycle 1, Reason: Change in SCr/CrCl) Administration: 490 mg (02/04/2019) fosaprepitant (EMEND) 150 mg in sodium chloride 0.9 % 145 mL IVPB, 150 mg, Intravenous,  Once, 1 of 6 cycles Administration: 150 mg (02/04/2019) PACLitaxel (TAXOL) 312 mg in sodium chloride 0.9 % 500 mL chemo infusion (> 78m/m2), 140 mg/m2 = 312 mg (80 % of original dose 175 mg/m2), Intravenous,  Once, 1 of 6 cycles Dose modification: 140 mg/m2 (80 % of original dose 175 mg/m2, Cycle 1, Reason: Provider Judgment) Administration: 312 mg (02/04/2019)  for chemotherapy treatment.      REVIEW OF SYSTEMS:   Constitutional: Denies fevers,  chills or abnormal weight loss Eyes: Denies blurriness of vision Ears, nose, mouth, throat, and face: Denies mucositis or sore throat Respiratory: Denies cough, dyspnea or wheezes Cardiovascular: Denies palpitation, chest discomfort or lower extremity swelling Gastrointestinal:  Denies nausea, heartburn or change in bowel habits Skin: Denies abnormal skin rashes Lymphatics: Denies new lymphadenopathy or easy bruising Behavioral/Psych: Mood is stable, no new changes  All other systems were reviewed with the patient and are negative.  I have reviewed the past medical history, past surgical history, social history and family history with the patient and they are unchanged from previous note.  ALLERGIES:  is allergic to chlorhexidine.  MEDICATIONS:  Current Outpatient Medications  Medication Sig Dispense Refill  . amiodarone (PACERONE) 200 MG tablet TAKE 1 TABLET(200 MG) BY MOUTH DAILY (Patient taking differently: Take 200 mg by mouth daily. ) 90 tablet 3  . amLODipine (NORVASC) 5 MG tablet Take 1 tablet (5 mg total) by mouth daily. 180 tablet 3   . atorvastatin (LIPITOR) 40 MG tablet Take 1 tablet (40 mg total) by mouth daily. 90 tablet 3  . calcium carbonate (OS-CAL - DOSED IN MG OF ELEMENTAL CALCIUM) 1250 MG tablet Take 1 tablet by mouth daily.      . carvedilol (COREG) 12.5 MG tablet Take 1 tablet (12.5 mg total) by mouth 2 (two) times daily with a meal. 180 tablet 3  . Cholecalciferol (VITAMIN D) 1000 UNITS capsule Take 1,000 Units by mouth daily.     . cyclobenzaprine (FLEXERIL) 10 MG tablet Take 10 mg by mouth daily.     [name] dexamethasone (DECADRON) 4 MG tablet Take 2 tabs at the night before chemotherapy, every 3 weeks, by mouth x 6 cycles, please dispense 12 tabs (Patient not taking: Reported on 01/22/2019) 60 tablet 0  . insulin glargine (LANTUS SOLOSTAR) 100 UNIT/ML injection Inject 30-35 Units into the skin See admin instructions. Inject 30 units into the skin in the morning and 35 units at bedtime.    . irbesartan (AVAPRO) 300 MG tablet Take 1 tablet (300 mg total) by mouth daily. 90 tablet 3  . metFORMIN (GLUCOPHAGE) 500 MG tablet Take 500 mg by mouth 2 (two) times daily with a meal.     . methocarbamol (ROBAXIN) 500 MG tablet Take 500 mg by mouth every 8 (eight) hours as needed for muscle spasms.     . ondansetron (ZOFRAN) 8 MG tablet Take 1 tablet (8 mg total) by mouth every 8 (eight) hours as needed. 30 tablet 1  . prochlorperazine (COMPAZINE) 10 MG tablet Take 1 tablet (10 mg total) by mouth every 6 (six) hours as needed (Nausea or vomiting). 30 tablet 1  . senna (SENOKOT) 8.6 MG TABS tablet Take 1 tablet (8.6 mg total) by mouth daily as needed for up to 15 doses for mild constipation. 15 tablet 0  . warfarin (COUMADIN) 4 MG tablet Take 1 to 1.5 tablets daily as directed by [facility] 135 tablet 1   No current facility-administered medications for this visit.    PHYSICAL EXAMINATION: ECOG PERFORMANCE STATUS: 0 - Asymptomatic  Vitals:   02/25/19 1109  BP: 125/64  Pulse: 77  Resp: 18  Temp: 98 F (36.7 C)  SpO2:  99%   Filed Weights   02/25/19 1109  Weight: 227 lb (103 kg)    GENERAL:alert, no distress and comfortable SKIN: skin color, texture, turgor are normal, no rashes or significant lesions EYES: normal, Conjunctiva are pink and non-injected, sclera clear OROPHARYNX:no exudate, no erythema and  lips, buccal mucosa, and tongue normal  NECK: supple, thyroid normal size, non-tender, without nodularity LYMPH:  no palpable lymphadenopathy in the cervical, axillary or inguinal LUNGS: clear to auscultation and percussion with normal breathing effort HEART: regular rate & rhythm with soft ejection systolic murmur on the left sternal border and no lower extremity edema ABDOMEN:abdomen soft, non-tender and normal bowel sounds Musculoskeletal:no cyanosis of digits and no clubbing  NEURO: alert & oriented x 3 with fluent speech, no focal motor/sensory deficits  LABORATORY DATA:  I have reviewed the data as listed    Component Value Date/Time   NA 137 02/25/2019 1027   NA 140 01/10/2019 0839   K 4.1 02/25/2019 1027   CL 105 02/25/2019 1027   CO2 23 02/25/2019 1027   GLUCOSE 219 (H) 02/25/2019 1027   BUN 16 02/25/2019 1027   BUN 12 01/10/2019 0839   CREATININE 1.24 (H) 02/25/2019 1027   CREATININE 0.67 05/28/2015 0958   CALCIUM 8.9 02/25/2019 1027   PROT 7.5 02/25/2019 1027   PROT 7.7 10/03/2016 0936   ALBUMIN 3.9 02/25/2019 1027   ALBUMIN 4.4 10/03/2016 0936   AST 16 02/25/2019 1027   ALT 16 02/25/2019 1027   ALKPHOS 95 02/25/2019 1027   BILITOT 0.5 02/25/2019 1027   GFRNONAA 46 (L) 02/25/2019 1027   GFRAA 53 (L) 02/25/2019 1027    No results found for: SPEP, UPEP  Lab Results  Component Value Date   WBC 7.2 02/25/2019   NEUTROABS 5.5 02/25/2019   HGB 11.0 (L) 02/25/2019   HCT 34.4 (L) 02/25/2019   MCV 97.5 02/25/2019   PLT 201 02/25/2019      Chemistry      Component Value Date/Time   NA 137 02/25/2019 1027   NA 140 01/10/2019 0839   K 4.1 02/25/2019 1027   CL 105  02/25/2019 1027   CO2 23 02/25/2019 1027   BUN 16 02/25/2019 1027   BUN 12 01/10/2019 0839   CREATININE 1.24 (H) 02/25/2019 1027   CREATININE 0.67 05/28/2015 0958      Component Value Date/Time   CALCIUM 8.9 02/25/2019 1027   ALKPHOS 95 02/25/2019 1027   AST 16 02/25/2019 1027   ALT 16 02/25/2019 1027   BILITOT 0.5 02/25/2019 1027

## 2019-02-25 NOTE — Assessment & Plan Note (Signed)
Overall, she tolerated treatment well Even with improvement of kidney function, I plan to keep the dose of carboplatin the same We will proceed with treatment without delay For her next cycle, I will delay her treatment by 1 day due to anticipated radiation treatment

## 2019-02-25 NOTE — Assessment & Plan Note (Signed)
She has significant elevated blood sugar likely secondary to steroid treatment For now, I plan to observe for now and do not recommend additional insulin therapy unless is over 250

## 2019-02-25 NOTE — Assessment & Plan Note (Signed)
She has mild elevated creatinine intermittently I suspect she had mild chronic kidney disease stage III

## 2019-03-01 ENCOUNTER — Telehealth: Payer: Self-pay | Admitting: Hematology and Oncology

## 2019-03-01 NOTE — Telephone Encounter (Signed)
Scheduled appts per 2/15 sch msg. Unable to reach pt and voicemail box was full. Sent out a reminder letter and calendar.

## 2019-03-08 ENCOUNTER — Ambulatory Visit (INDEPENDENT_AMBULATORY_CARE_PROVIDER_SITE_OTHER): Payer: Medicare Other | Admitting: *Deleted

## 2019-03-08 ENCOUNTER — Other Ambulatory Visit: Payer: Self-pay

## 2019-03-08 DIAGNOSIS — Z5181 Encounter for therapeutic drug level monitoring: Secondary | ICD-10-CM | POA: Diagnosis not present

## 2019-03-08 DIAGNOSIS — I059 Rheumatic mitral valve disease, unspecified: Secondary | ICD-10-CM | POA: Diagnosis not present

## 2019-03-08 LAB — POCT INR: INR: 1.7 — AB (ref 2.0–3.0)

## 2019-03-08 NOTE — Patient Instructions (Signed)
Description   Take 2 tablets today then start taking 1.5 tablets daily except for 2 tablets on Tuesdays, Thursdays and Saturdays.  Recheck INR in 1 week. Call us with any medication changes or concerns to [facility] @ [phone].

## 2019-03-08 NOTE — Progress Notes (Signed)
Informed pt to let us know if she is having diarrhrea, vomiting and no appetite because those can affect the INR.

## 2019-03-13 NOTE — Progress Notes (Signed)
  Radiation Oncology         [phone] ________________________________  Name: [name] MRN: [id number]  Date: 03/18/2019  DOB: 03-13-1955  CC: Lennie Odor, PA  [name], [place], [name]  HDR BRACHYTHERAPY NOTE  DIAGNOSIS: Stage II endometrial cancer, high-grade   Simple treatment device note: Patient had construction of her custom vaginal cylinder. She will be treated with a 3.0 cm diameter segmented cylinder. This conforms to her anatomy without undue discomfort.  Vaginal brachytherapy procedure node: The patient was brought to the [facility] suite. Identity was confirmed. All relevant records and images related to the planned course of therapy were reviewed. The patient freely provided informed written consent to proceed with treatment after reviewing the details related to the planned course of therapy. The consent form was witnessed and verified by the simulation staff. Then, the patient was set-up in a stable reproducible supine position for radiation therapy. Pelvic exam revealed the vaginal cuff to be intact . The patient's custom vaginal cylinder was placed in the proximal vagina. This was affixed to the CT/MR stabilization plate to prevent slippage. Patient tolerated the placement well.  Verification simulation note:  A fiducial marker was placed within the vaginal cylinder. An AP and lateral film was then obtained through the pelvis area. This documented accurate position of the vaginal cylinder for treatment.  HDR BRACHYTHERAPY TREATMENT  The remote afterloading device was affixed to the vaginal cylinder by catheter. Patient then proceeded to undergo her first high-dose-rate treatment directed at the proximal vagina. The patient was prescribed a dose of 6.0 gray to be delivered to the mucosal surface. Treatment length was 3.0 cm. Patient was treated with 1 channel using 7 dwell positions. Treatment time was 296.5 seconds. Iridium 192 was the high-dose-rate source for treatment. The  patient tolerated the treatment well. After completion of her therapy, a radiation survey was performed documenting return of the iridium source into the [employer] safe.   PLAN: The patient will return next week for her second high-dose-rate treatment. ________________________________    Blair Promise, PhD, MD  This document serves as a record of services personally performed by Gery Pray, MD. It was created on his behalf by Clerance Lav, a trained medical scribe. The creation of this record is based on the scribe's personal observations and the provider's statements to them. This document has been checked and approved by the attending provider.

## 2019-03-13 NOTE — Progress Notes (Signed)
Radiation Oncology         [phone] ________________________________  Vaginal Brachytherapy Procedure Note  Name: [name] MRN: [id number]  Date: 03/18/2019  DOB: 1955-02-22    ICD-10-CM   1. Endometrial cancer ([facility])  C54.1     Diagnosis: Stage II endometrial cancer, high-grade  Narrative: The patient returns today for vaginal cylinder fitting. She was seen in consultation on 01/23/2019. At that time, it was recommended that the patient proceed with adjuvant chemotherapy and vaginal brachytherapy to be started during her second or third cycle of chemotherapy.  Since consultation, the patient was seen by Dr. Alvy Bimler on 02/04/2019 and 02/25/2019. She began chemotherapy with Carboplatin and Paclitaxel on 02/04/2019. Overall, she is tolerating treatment well.  On review of systems, the patient reports anxiety related nausea. The patient denies pelvic pain abdominal bloating vaginal bleeding constipation or diarrhea.  Allergies:  is allergic to chlorhexidine.  Meds: Current Outpatient Medications  Medication Sig Dispense Refill  . amiodarone (PACERONE) 200 MG tablet TAKE 1 TABLET(200 MG) BY MOUTH DAILY (Patient taking differently: Take 200 mg by mouth daily. ) 90 tablet 3  . amLODipine (NORVASC) 5 MG tablet Take 1 tablet (5 mg total) by mouth daily. 180 tablet 3  . atorvastatin (LIPITOR) 40 MG tablet Take 1 tablet (40 mg total) by mouth daily. 90 tablet 3  . calcium carbonate (OS-CAL - DOSED IN MG OF ELEMENTAL CALCIUM) 1250 MG tablet Take 1 tablet by mouth daily.      . carvedilol (COREG) 12.5 MG tablet Take 1 tablet (12.5 mg total) by mouth 2 (two) times daily with a meal. 180 tablet 3  . Cholecalciferol (VITAMIN D) 1000 UNITS capsule Take 1,000 Units by mouth daily.     . cyclobenzaprine (FLEXERIL) 10 MG tablet Take 10 mg by mouth daily.     [name] dexamethasone (DECADRON) 4 MG tablet Take 2 tabs at the night before chemotherapy, every 3 weeks, by mouth x 6 cycles, please dispense  12 tabs 60 tablet 0  . insulin glargine (LANTUS SOLOSTAR) 100 UNIT/ML injection Inject 30-35 Units into the skin See admin instructions. Inject 30 units into the skin in the morning and 35 units at bedtime.    . irbesartan (AVAPRO) 300 MG tablet Take 1 tablet (300 mg total) by mouth daily. 90 tablet 3  . metFORMIN (GLUCOPHAGE) 500 MG tablet Take 500 mg by mouth 2 (two) times daily with a meal.     . methocarbamol (ROBAXIN) 500 MG tablet Take 500 mg by mouth every 8 (eight) hours as needed for muscle spasms.     . ondansetron (ZOFRAN) 8 MG tablet Take 1 tablet (8 mg total) by mouth every 8 (eight) hours as needed. 30 tablet 1  . prochlorperazine (COMPAZINE) 10 MG tablet Take 1 tablet (10 mg total) by mouth every 6 (six) hours as needed (Nausea or vomiting). 30 tablet 1  . senna (SENOKOT) 8.6 MG TABS tablet Take 1 tablet (8.6 mg total) by mouth daily as needed for up to 15 doses for mild constipation. 15 tablet 0  . warfarin (COUMADIN) 4 MG tablet Take 1 to 1.5 tablets daily as directed by [facility] 135 tablet 1   No current facility-administered medications for this encounter.    Physical Findings:  The patient is in no acute distress. Patient is alert and oriented.  height is 5\' 6"  (1.676 m) and weight is 231 lb 6 oz (105 kg). Her temporal temperature is 99.2 F (37.3 C). Her blood pressure  is 165/72 (abnormal) and her pulse is 68. Her respiration is 18 and oxygen saturation is 100%.   Lungs are clear to auscultation bilaterally. Heart has regular rate and rhythm. No palpable cervical, supraclavicular, or axillary adenopathy. Abdomen soft, non-tender, normal bowel sounds.  On pelvic examination the external genitalia are unremarkable.  A speculum exam was performed.  No mucosal lesions noted in the vaginal vault.  On bimanual examination the vaginal cuff is noted to be intact.  No pelvic masses appreciated.  No visible sutures at this time.  Lab Findings: Lab Results  Component Value Date     WBC 7.2 02/25/2019   HGB 11.0 (L) 02/25/2019   HCT 34.4 (L) 02/25/2019   MCV 97.5 02/25/2019   PLT 201 02/25/2019    Radiographic Findings: No results found.  Impression:  Stage II endometrial cancer, high-grade  Patient is now ready to proceed with vaginal brachytherapy as part of her adjuvant treatment.  She will be treated with a 3.0 diameter segmented cylinder.  This diameter cylinder distended the vaginal vault without any undue discomfort.  Plan: The patient will proceed with CT simulation and vaginal brachytherapy today.  Patient will receive 5 high-dose-rate treatments directed at the vaginal cuff.  Iridium 192 will be the high-dose-rate source.  She will proceed with another cycle of chemotherapy tomorrow.  -----------------------------------  Blair Promise, PhD, MD  This document serves as a record of services personally performed by Gery Pray, MD. It was created on his behalf by Clerance Lav, a trained medical scribe. The creation of this record is based on the scribe's personal observations and the provider's statements to them. This document has been checked and approved by the attending provider.

## 2019-03-15 ENCOUNTER — Telehealth: Payer: Self-pay | Admitting: *Deleted

## 2019-03-15 NOTE — Telephone Encounter (Signed)
Called patient's daughterGarvin Fila to remind of [facility] , spoke with patient's daughter, Garvin Fila and she is aware of these appts.

## 2019-03-18 ENCOUNTER — Ambulatory Visit
Admission: RE | Admit: 2019-03-18 | Discharge: 2019-03-18 | Disposition: A | Discharge status: Home / Self Care | Payer: Medicare Other | Source: Ambulatory Visit | Attending: Radiation Oncology | Admitting: Radiation Oncology

## 2019-03-18 ENCOUNTER — Other Ambulatory Visit: Payer: Self-pay

## 2019-03-18 ENCOUNTER — Encounter: Payer: Self-pay | Admitting: Radiation Oncology

## 2019-03-18 VITALS — BP 165/72 | HR 68 | Temp 99.2°F | Resp 18 | Ht 66.0 in | Wt 231.4 lb

## 2019-03-18 DIAGNOSIS — C541 Malignant neoplasm of endometrium: Secondary | ICD-10-CM

## 2019-03-18 DIAGNOSIS — Z79899 Other long term (current) drug therapy: Secondary | ICD-10-CM | POA: Insufficient documentation

## 2019-03-18 DIAGNOSIS — Z794 Long term (current) use of insulin: Secondary | ICD-10-CM | POA: Insufficient documentation

## 2019-03-18 DIAGNOSIS — Z7901 Long term (current) use of anticoagulants: Secondary | ICD-10-CM | POA: Diagnosis not present

## 2019-03-18 DIAGNOSIS — R110 Nausea: Secondary | ICD-10-CM | POA: Insufficient documentation

## 2019-03-18 NOTE — Patient Instructions (Signed)
Coronavirus (COVID-19) Are you at risk?  Are you at risk for the Coronavirus (COVID-19)?  To be considered HIGH RISK for Coronavirus (COVID-19), you have to meet the following criteria:  . Traveled to [place], [place], [place], [place] or [place]; or in the [place] to [place], [place], [place], or [place]; and have fever, cough, and shortness of breath within the last 2 weeks of travel OR . Been in close contact with a person diagnosed with COVID-19 within the last 2 weeks and have fever, cough, and shortness of breath . IF YOU DO NOT MEET THESE CRITERIA, YOU ARE CONSIDERED LOW RISK FOR COVID-19.  What to do if you are HIGH RISK for COVID-19?  . If you are having a medical emergency, call 911. . Seek medical care right away. Before you go to a doctor's office, urgent care or emergency department, call ahead and tell them about your recent travel, contact with someone diagnosed with COVID-19, and your symptoms. You should receive instructions from your physician's office regarding next steps of care.  . When you arrive at healthcare provider, tell the healthcare staff immediately you have returned from visiting [place], [place], [place], [place] or [place]; or traveled in the [place] to [place], [place], [place], or [place]; in the last two weeks or you have been in close contact with a person diagnosed with COVID-19 in the last 2 weeks.   . Tell the health care staff about your symptoms: fever, cough and shortness of breath. . After you have been seen by a medical provider, you will be either: o Tested for (COVID-19) and discharged home on quarantine except to seek medical care if symptoms worsen, and asked to  - Stay home and avoid contact with others until you get your results (4-5 days)  - Avoid travel on public transportation if possible (such as bus, train, or airplane) or o Sent to the Emergency Department by EMS for evaluation, COVID-19 testing, and possible  admission depending on your condition and test results.  What to do if you are LOW RISK for COVID-19?  Reduce your risk of any infection by using the same precautions used for avoiding the common cold or flu:  . Wash your hands often with soap and warm water for at least 20 seconds.  If soap and water are not readily available, use an alcohol-based hand sanitizer with at least 60% alcohol.  . If coughing or sneezing, cover your mouth and nose by coughing or sneezing into the elbow areas of your shirt or coat, into a tissue or into your sleeve (not your hands). . Avoid shaking hands with others and consider head nods or verbal greetings only. . Avoid touching your eyes, nose, or mouth with unwashed hands.  . Avoid close contact with people who are sick. . Avoid places or events with large numbers of people in one location, like concerts or sporting events. . Carefully consider travel plans you have or are making. . If you are planning any travel outside or inside the [place], visit the CDC's Travelers' Health webpage for the latest health notices. . If you have some symptoms but not all symptoms, continue to monitor at home and seek medical attention if your symptoms worsen. . If you are having a medical emergency, call 911.   ADDITIONAL HEALTHCARE OPTIONS FOR PATIENTS  [facility] / e-Visit: [email or web address]         [facility]: [phone]  [facility]: [phone]                   [facility]: [phone]   

## 2019-03-18 NOTE — Progress Notes (Signed)
  Radiation Oncology         [phone] ________________________________  Name: [name] MRN: [id number]  Date: 03/18/2019  DOB: [birth year]  SIMULATION AND TREATMENT PLANNING NOTE HDR BRACHYTHERAPY  DIAGNOSIS:  StageIIendometrialcancer, high-grade  NARRATIVE:  The patient was brought to the [place] suite.  Identity was confirmed.  All relevant records and images related to the planned course of therapy were reviewed.  The patient freely provided informed written consent to proceed with treatment after reviewing the details related to the planned course of therapy. The consent form was witnessed and verified by the simulation staff.  Then, the patient was set-up in a stable reproducible  supine position for radiation therapy.  CT images were obtained.  Surface markings were placed.  The CT images were loaded into the planning software.  Then the target and avoidance structures were contoured.  Treatment planning then occurred.  The radiation prescription was entered and confirmed.   I have requested : Brachytherapy Isodose Plan and Dosimetry Calculations to plan the radiation distribution.    PLAN:  The patient will receive 30 Gy in 5 fractions.  Patient will be treated with a 3.0 diameter segmented cylinder with a treatment length of 3 cm.  Prescription will be to the mucosal surface.  Iridium 192 will be the high-dose-rate source.    ________________________________  Blair Promise, PhD, MD

## 2019-03-18 NOTE — Progress Notes (Signed)
[name] presents today for new [facility] HDR with Dr. Sondra Come. Pt denies c/o pain. Pt denies dysuria/hematuria. Pt denies vaginal bleeding/discharge. Pt denies rectal bleeding, diarrhea/constipation. Pt denies abdominal bloating, N/V. Pt does report situational anxiety that presents as nausea without vomiting.  BP (!) 165/72 (BP Location: Left Arm, Patient Position: Sitting)   Pulse 68   Temp 99.2 F (37.3 C) (Temporal)   Resp 18   Ht 5\' 6"  (1.676 m)   Wt 231 lb 6 oz (105 kg)   SpO2 100%   BMI 37.34 kg/m   Wt Readings from Last 3 Encounters:  03/18/19 231 lb 6 oz (105 kg)  02/25/19 227 lb (103 kg)  02/04/19 226 lb 12.8 oz (102.9 kg)   Loma Sousa, RN BSN

## 2019-03-19 ENCOUNTER — Other Ambulatory Visit: Payer: Self-pay | Admitting: Hematology and Oncology

## 2019-03-19 ENCOUNTER — Encounter: Payer: Self-pay | Admitting: Hematology and Oncology

## 2019-03-19 ENCOUNTER — Inpatient Hospital Stay: Payer: Medicare Other | Attending: Gynecologic Oncology

## 2019-03-19 ENCOUNTER — Other Ambulatory Visit: Payer: Self-pay

## 2019-03-19 ENCOUNTER — Inpatient Hospital Stay: Payer: Medicare Other

## 2019-03-19 ENCOUNTER — Inpatient Hospital Stay (HOSPITAL_BASED_OUTPATIENT_CLINIC_OR_DEPARTMENT_OTHER): Payer: Medicare Other | Admitting: Hematology and Oncology

## 2019-03-19 DIAGNOSIS — Z5111 Encounter for antineoplastic chemotherapy: Secondary | ICD-10-CM | POA: Diagnosis not present

## 2019-03-19 DIAGNOSIS — N183 Chronic kidney disease, stage 3 unspecified: Secondary | ICD-10-CM

## 2019-03-19 DIAGNOSIS — R739 Hyperglycemia, unspecified: Secondary | ICD-10-CM

## 2019-03-19 DIAGNOSIS — C541 Malignant neoplasm of endometrium: Secondary | ICD-10-CM

## 2019-03-19 DIAGNOSIS — E1165 Type 2 diabetes mellitus with hyperglycemia: Secondary | ICD-10-CM | POA: Diagnosis not present

## 2019-03-19 DIAGNOSIS — T50905A Adverse effect of unspecified drugs, medicaments and biological substances, initial encounter: Secondary | ICD-10-CM

## 2019-03-19 LAB — CBC WITH DIFFERENTIAL (CANCER CENTER ONLY)
Abs Immature Granulocytes: 0.05 10*3/uL (ref 0.00–0.07)
Basophils Absolute: 0.1 10*3/uL (ref 0.0–0.1)
Basophils Relative: 1 %
Eosinophils Absolute: 0.1 10*3/uL (ref 0.0–0.5)
Eosinophils Relative: 1 %
HCT: 33.7 % — ABNORMAL LOW (ref 36.0–46.0)
Hemoglobin: 10.8 g/dL — ABNORMAL LOW (ref 12.0–15.0)
Immature Granulocytes: 1 %
Lymphocytes Relative: 18 %
Lymphs Abs: 1.1 10*3/uL (ref 0.7–4.0)
MCH: 30.9 pg (ref 26.0–34.0)
MCHC: 32 g/dL (ref 30.0–36.0)
MCV: 96.6 fL (ref 80.0–100.0)
Monocytes Absolute: 0.6 10*3/uL (ref 0.1–1.0)
Monocytes Relative: 10 %
Neutro Abs: 4.2 10*3/uL (ref 1.7–7.7)
Neutrophils Relative %: 69 %
Platelet Count: 168 10*3/uL (ref 150–400)
RBC: 3.49 MIL/uL — ABNORMAL LOW (ref 3.87–5.11)
RDW: 13.1 % (ref 11.5–15.5)
WBC Count: 6.1 10*3/uL (ref 4.0–10.5)
nRBC: 0 % (ref 0.0–0.2)

## 2019-03-19 LAB — CMP (CANCER CENTER ONLY)
ALT: 13 U/L (ref 0–44)
AST: 17 U/L (ref 15–41)
Albumin: 3.7 g/dL (ref 3.5–5.0)
Alkaline Phosphatase: 99 U/L (ref 38–126)
Anion gap: 8 (ref 5–15)
BUN: 13 mg/dL (ref 8–23)
CO2: 25 mmol/L (ref 22–32)
Calcium: 8.7 mg/dL — ABNORMAL LOW (ref 8.9–10.3)
Chloride: 108 mmol/L (ref 98–111)
Creatinine: 1.1 mg/dL — ABNORMAL HIGH (ref 0.44–1.00)
GFR, Est AFR Am: >60 mL/min (ref >60)
GFR, Estimated: 53 mL/min — ABNORMAL LOW (ref >60)
Glucose, Bld: 273 mg/dL — ABNORMAL HIGH (ref 70–99)
Potassium: 4 mmol/L (ref 3.5–5.1)
Sodium: 141 mmol/L (ref 135–145)
Total Bilirubin: 0.7 mg/dL (ref 0.3–1.2)
Total Protein: 7.2 g/dL (ref 6.5–8.1)

## 2019-03-19 MED ORDER — SODIUM CHLORIDE 0.9 % IV SOLN
654.0000 mg | Freq: Once | INTRAVENOUS | Status: AC
  Administered 2019-03-19: 650 mg via INTRAVENOUS
  Filled 2019-03-19: qty 65

## 2019-03-19 MED ORDER — INSULIN REGULAR HUMAN 100 UNIT/ML IJ SOLN
INTRAMUSCULAR | Status: AC
  Filled 2019-03-19: qty 1

## 2019-03-19 MED ORDER — DIPHENHYDRAMINE HCL 50 MG/ML IJ SOLN
INTRAMUSCULAR | Status: AC
  Filled 2019-03-19: qty 1

## 2019-03-19 MED ORDER — PALONOSETRON HCL INJECTION 0.25 MG/5ML
INTRAVENOUS | Status: AC
  Filled 2019-03-19: qty 5

## 2019-03-19 MED ORDER — SODIUM CHLORIDE 0.9 % IV SOLN
140.0000 mg/m2 | Freq: Once | INTRAVENOUS | Status: AC
  Administered 2019-03-19: 306 mg via INTRAVENOUS
  Filled 2019-03-19: qty 51

## 2019-03-19 MED ORDER — PALONOSETRON HCL INJECTION 0.25 MG/5ML
0.2500 mg | Freq: Once | INTRAVENOUS | Status: AC
  Administered 2019-03-19: 0.25 mg via INTRAVENOUS

## 2019-03-19 MED ORDER — FAMOTIDINE IN NACL 20-0.9 MG/50ML-% IV SOLN
20.0000 mg | Freq: Once | INTRAVENOUS | Status: AC
  Administered 2019-03-19: 20 mg via INTRAVENOUS

## 2019-03-19 MED ORDER — SODIUM CHLORIDE 0.9 % IV SOLN
Freq: Once | INTRAVENOUS | Status: AC
  Filled 2019-03-19: qty 250

## 2019-03-19 MED ORDER — DEXAMETHASONE SODIUM PHOSPHATE 10 MG/ML IJ SOLN
INTRAMUSCULAR | Status: AC
  Filled 2019-03-19: qty 1

## 2019-03-19 MED ORDER — SODIUM CHLORIDE 0.9 % IV SOLN
150.0000 mg | Freq: Once | INTRAVENOUS | Status: AC
  Administered 2019-03-19: 150 mg via INTRAVENOUS
  Filled 2019-03-19: qty 150

## 2019-03-19 MED ORDER — FAMOTIDINE IN NACL 20-0.9 MG/50ML-% IV SOLN
INTRAVENOUS | Status: AC
  Filled 2019-03-19: qty 50

## 2019-03-19 MED ORDER — INSULIN REGULAR HUMAN 100 UNIT/ML IJ SOLN
10.0000 [IU] | Freq: Once | INTRAMUSCULAR | Status: AC
  Administered 2019-03-19: 10 [IU] via SUBCUTANEOUS

## 2019-03-19 MED ORDER — DIPHENHYDRAMINE HCL 50 MG/ML IJ SOLN
50.0000 mg | Freq: Once | INTRAMUSCULAR | Status: AC
  Administered 2019-03-19: 50 mg via INTRAVENOUS

## 2019-03-19 MED ORDER — DEXAMETHASONE SODIUM PHOSPHATE 10 MG/ML IJ SOLN
10.0000 mg | Freq: Once | INTRAMUSCULAR | Status: AC
  Administered 2019-03-19: 10 mg via INTRAVENOUS

## 2019-03-19 NOTE — Progress Notes (Signed)
[facility] OFFICE PROGRESS NOTE  Patient Care Team: Lennie Odor, [name] as PCP - General (Nurse Practitioner) Jettie Booze, MD as PCP - Cardiology (Cardiology) Kelton Pillar, MD as Attending Physician (Family Medicine) Jettie Booze, MD (Cardiology) Jettie Booze, MD as Attending Physician (Cardiology)  ASSESSMENT & PLAN:  Endometrial cancer [name]) So far, she tolerated treatment very well without major side effects We will proceed with treatment as scheduled  CKD (chronic kidney disease) stage 3, GFR 30-59 ml/min Her renal function has improved I will adjust the dose of carboplatin accordingly  Drug-induced hyperglycemia She has significant drug-induced hyperglycemia I will give her a shot of insulin today I advised her dietary modification while on treatment   No orders of the defined types were placed in this encounter.   All questions were answered. The patient knows to call the clinic with any problems, questions or concerns. The total time spent in the appointment was 20 minutes encounter with patients including review of chart and various tests results, discussions about plan of care and coordination of care plan   Heath Lark, MD 03/19/2019 11:12 AM  INTERVAL HISTORY: Please see below for problem oriented charting. She returns for chemotherapy and follow-up She feels well No recent side effects such as neuropathy No nausea or changes in bowel habits  SUMMARY OF ONCOLOGIC HISTORY: Oncology History Overview Note  Clear cell features MSI stable   Endometrial cancer ([place])  09/24/2018 Initial Diagnosis   She presented with post menopausal bleeding   11/20/2018 Initial Biopsy   EMB 11/10: G3 carcinoma with clear cell features.  Pap 11/10: AGC    12/03/2018 Surgery   TRH/BSO, bil SLNs   12/05/2018 Imaging   CT C/A/P:  1. Low attenuation mass or fluid in the endometrial cavity (series 2, image 110), in keeping with known  endometrial malignancy. 2. Numerous small bilateral ground-glass pulmonary nodules. Although nonspecific these are likely infectious or inflammatory given appearance, isolated manifestation of metastatic disease much less favored. 3. Enlarged mediastinal and hilar lymph nodes, likely reactive given pulmonary findings. 4. No evidence of metastatic disease in the abdomen or pelvis. 5. Nonobstructive right nephrolithiasis. 6. Aortic Atherosclerosis (ICD10-I70.0).   01/02/2019 Pathologic Stage   Stage II Gr3 (clear cell features), no LVSI, cervial stroma involved, <50% MI MSI-low  A. 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            Tumor limited to upper half of myometrium.            Leiomyoma.            See oncology table.       Serosa: Fibrous adhesions with endosalpingiosis. No malignancy.  - Cervix: Stroma involved by tumor.  - Bilateral ovaries: Inclusion cysts. No malignancy.  - Bilateral fallopian tubes: Unremarkable. No malignancy.   B. LYMPH NODE, RIGHT OBTURATOR, SENTINEL, BIOPSY:  - One of one lymph nodes negative for carcinoma (0/1).   C. LYMPH NODE, LEFT EXTERNAL ILIAC, SENTINEL, BIOPSY:  - One of one lymph nodes negative for carcinoma (0/1).   D. LYMPH NODE, LEFT EXTERNAL ILIAC, BIOPSY:  - One of one lymph nodes negative for carcinoma (0/1).   ONCOLOGY TABLE:   UTERUS, CARCINOMA OR CARCINOSARCOMA   Procedure: Total hysterectomy and bilateral salpingo-oophorectomy  Histologic type: Poorly differentiated carcinoma with clear cell  features, see comment.  Histologic Grade: High-grade  Myometrial invasion:       Depth of invasion:9 mm       Myometrial thickness: 20 mm  Uterine Serosa Involvement: Not identified  Cervical stromal involvement: Present  Extent of involvement of other organs: Not identified  Lymphovascular  invasion: Not identified  Regional Lymph Nodes:       Examined:     3 Sentinel                               0 non-sentinel                               3 total        Lymph nodes with metastasis: 0        Isolated tumor cells (<0.2 mm): 0        Micrometastasis:  (>0.2 mm and < 2.0 mm): 0        Macrometastasis: (>2.0 mm): 0        Extracapsular extension: Not applicable  Representative Tumor Block: A5  MMR / MSI testing: Will be ordered  Pathologic Stage Classification (pTNM, AJCC 8th edition):  pT2, pNX  Comments: The tumor consists of solid sheets and more tubulocystic areas with focal areas of cytoplasmic clearing. Immunohistochemistry is positive for racemase (weak), Napsin-A (focal), and p53 (wild type). ER and PR are negative. While extensive clear cell changes are not seen in the more solid components, the immunoprofile along with more definitive areas of clear cell changes favor the overall tumor to be a clear cell carcinoma. The tumor has a more pushing type of invasion. There is stromal invasion in areas of endocervical mucosa. Immunohistochemistry on the serosa adhesions reveals mesothelial cells (calretinin) and a small focus of endosalpingiosis (PAX-8, ER, MOC31). Pancytokeratin on the lymph nodes is negative.    01/18/2019 Cancer Staging   Staging form: Corpus Uteri - Carcinoma and Carcinosarcoma, AJCC 8th Edition - Pathologic: Stage II (pT2, pN0, cM0) - Signed by Heath Lark, MD on 01/18/2019   02/04/2019 -  Chemotherapy   The patient had palonosetron (ALOXI) injection 0.25 mg, 0.25 mg, Intravenous,  Once, 3 of 6 cycles Administration: 0.25 mg (02/04/2019), 0.25 mg (02/25/2019) CARBOplatin (PARAPLATIN) 490 mg in sodium chloride 0.9 % 250 mL chemo infusion, 490 mg (65.5 % of original dose 750 mg), Intravenous,  Once, 3 of 6 cycles Dose modification: 750 mg (original dose 750 mg, Cycle 1, Reason: Provider Judgment), 491.4 mg (original dose 750 mg, Cycle 1, Reason: Change in SCr/CrCl),    (original dose 750 mg, Cycle 3, Reason: Provider Judgment) Administration: 490 mg (02/04/2019), 490 mg (02/25/2019) fosaprepitant (EMEND) 150 mg in sodium chloride 0.9 % 145 mL IVPB, 150 mg, Intravenous,  Once, 3 of 6 cycles Administration: 150 mg (02/04/2019), 150 mg (02/25/2019) PACLitaxel (TAXOL) 312 mg in sodium chloride 0.9 % 500 mL chemo infusion (> 70m/m2), 140 mg/m2 = 312 mg (80 % of original dose 175 mg/m2), Intravenous,  Once, 3 of 6 cycles Dose modification: 140 mg/m2 (80 % of original dose 175 mg/m2, Cycle 1, Reason: Provider Judgment) Administration: 312 mg (02/04/2019), 300 mg (02/25/2019)  for chemotherapy treatment.      REVIEW OF SYSTEMS:   Constitutional: Denies fevers, chills or abnormal weight loss Eyes: Denies blurriness of vision Ears, nose, mouth, throat, and face: Denies mucositis or sore throat Respiratory: Denies cough, dyspnea or wheezes  Cardiovascular: Denies palpitation, chest discomfort or lower extremity swelling Gastrointestinal:  Denies nausea, heartburn or change in bowel habits Skin: Denies abnormal skin rashes Lymphatics: Denies new lymphadenopathy or easy bruising Neurological:Denies numbness, tingling or new weaknesses Behavioral/Psych: Mood is stable, no new changes  All other systems were reviewed with the patient and are negative.  I have reviewed the past medical history, past surgical history, social history and family history with the patient and they are unchanged from previous note.  ALLERGIES:  is allergic to chlorhexidine.  MEDICATIONS:  Current Outpatient Medications  Medication Sig Dispense Refill  . amiodarone (PACERONE) 200 MG tablet TAKE 1 TABLET(200 MG) BY MOUTH DAILY (Patient taking differently: Take 200 mg by mouth daily. ) 90 tablet 3  . amLODipine (NORVASC) 5 MG tablet Take 1 tablet (5 mg total) by mouth daily. 180 tablet 3  . atorvastatin (LIPITOR) 40 MG tablet Take 1 tablet (40 mg total) by mouth daily. 90 tablet 3  . calcium  carbonate (OS-CAL - DOSED IN MG OF ELEMENTAL CALCIUM) 1250 MG tablet Take 1 tablet by mouth daily.      . carvedilol (COREG) 12.5 MG tablet Take 1 tablet (12.5 mg total) by mouth 2 (two) times daily with a meal. 180 tablet 3  . Cholecalciferol (VITAMIN D) 1000 UNITS capsule Take 1,000 Units by mouth daily.     . cyclobenzaprine (FLEXERIL) 10 MG tablet Take 10 mg by mouth daily.     [name] dexamethasone (DECADRON) 4 MG tablet Take 2 tabs at the night before chemotherapy, every 3 weeks, by mouth x 6 cycles, please dispense 12 tabs 60 tablet 0  . insulin glargine (LANTUS SOLOSTAR) 100 UNIT/ML injection Inject 30-35 Units into the skin See admin instructions. Inject 30 units into the skin in the morning and 35 units at bedtime.    . irbesartan (AVAPRO) 300 MG tablet Take 1 tablet (300 mg total) by mouth daily. 90 tablet 3  . metFORMIN (GLUCOPHAGE) 500 MG tablet Take 500 mg by mouth 2 (two) times daily with a meal.     . methocarbamol (ROBAXIN) 500 MG tablet Take 500 mg by mouth every 8 (eight) hours as needed for muscle spasms.     . ondansetron (ZOFRAN) 8 MG tablet Take 1 tablet (8 mg total) by mouth every 8 (eight) hours as needed. 30 tablet 1  . prochlorperazine (COMPAZINE) 10 MG tablet Take 1 tablet (10 mg total) by mouth every 6 (six) hours as needed (Nausea or vomiting). 30 tablet 1  . senna (SENOKOT) 8.6 MG TABS tablet Take 1 tablet (8.6 mg total) by mouth daily as needed for up to 15 doses for mild constipation. 15 tablet 0  . warfarin (COUMADIN) 4 MG tablet Take 1 to 1.5 tablets daily as directed by [facility] 135 tablet 1   No current facility-administered medications for this visit.   Facility-Administered Medications Ordered in Other Visits  Medication Dose Route Frequency Provider Last Rate Last Admin  . CARBOplatin (PARAPLATIN) 650 mg in sodium chloride 0.9 % 250 mL chemo infusion  650 mg Intravenous Once Alvy Bimler, Bayani Renteria, MD      . dexamethasone (DECADRON) injection 10 mg  10 mg Intravenous  Once Alvy Bimler, Oliveah Zwack, MD      . diphenhydrAMINE (BENADRYL) injection 50 mg  50 mg Intravenous Once Alvy Bimler, Deloris Mittag, MD      . famotidine (PEPCID) IVPB 20 mg premix  20 mg Intravenous Once Samaya Boardley, MD      . fosaprepitant (EMEND) 150 mg in  sodium chloride 0.9 % 145 mL IVPB  150 mg Intravenous Once Juvon Teater, MD      . insulin regular (NOVOLIN R) 100 units/mL injection 10 Units  10 Units Subcutaneous Once Alvy Bimler, Shakthi Scipio, MD      . PACLitaxel (TAXOL) 306 mg in sodium chloride 0.9 % 500 mL chemo infusion (> 60m/m2)  140 mg/m2 (Treatment Plan Recorded) Intravenous Once GAlvy Bimler Tahjae Durr, MD      . palonosetron (ALOXI) injection 0.25 mg  0.25 mg Intravenous Once GAlvy Bimler Willford Rabideau, MD        PHYSICAL EXAMINATION: ECOG PERFORMANCE STATUS: 1 - Symptomatic but completely ambulatory  Vitals:   03/19/19 0935  BP: (!) 148/60  Pulse: 76  Resp: 16  Temp: 98.2 F (36.8 C)  SpO2: 99%   Filed Weights   03/19/19 0935  Weight: 231 lb 6.4 oz (105 kg)    GENERAL:alert, no distress and comfortable SKIN: skin color, texture, turgor are normal, no rashes or significant lesions EYES: normal, Conjunctiva are pink and non-injected, sclera clear OROPHARYNX:no exudate, no erythema and lips, buccal mucosa, and tongue normal  NECK: supple, thyroid normal size, non-tender, without nodularity LYMPH:  no palpable lymphadenopathy in the cervical, axillary or inguinal LUNGS: clear to auscultation and percussion with normal breathing effort HEART: regular rate & rhythm with soft ejection murmurs and no lower extremity edema ABDOMEN:abdomen soft, non-tender and normal bowel sounds Musculoskeletal:no cyanosis of digits and no clubbing  NEURO: alert & oriented x 3 with fluent speech, no focal motor/sensory deficits  LABORATORY DATA:  I have reviewed the data as listed    Component Value Date/Time   NA 141 03/19/2019 0920   NA 140 01/10/2019 0839   K 4.0 03/19/2019 0920   CL 108 03/19/2019 0920   CO2 25 03/19/2019 0920    GLUCOSE 273 (H) 03/19/2019 0920   BUN 13 03/19/2019 0920   BUN 12 01/10/2019 0839   CREATININE 1.10 (H) 03/19/2019 0920   CREATININE 0.67 05/28/2015 0958   CALCIUM 8.7 (L) 03/19/2019 0920   PROT 7.2 03/19/2019 0920   PROT 7.7 10/03/2016 0936   ALBUMIN 3.7 03/19/2019 0920   ALBUMIN 4.4 10/03/2016 0936   AST 17 03/19/2019 0920   ALT 13 03/19/2019 0920   ALKPHOS 99 03/19/2019 0920   BILITOT 0.7 03/19/2019 0920   GFRNONAA 53 (L) 03/19/2019 0920   GFRAA >60 03/19/2019 0920    No results found for: SPEP, UPEP  Lab Results  Component Value Date   WBC 6.1 03/19/2019   NEUTROABS 4.2 03/19/2019   HGB 10.8 (L) 03/19/2019   HCT 33.7 (L) 03/19/2019   MCV 96.6 03/19/2019   PLT 168 03/19/2019      Chemistry      Component Value Date/Time   NA 141 03/19/2019 0920   NA 140 01/10/2019 0839   K 4.0 03/19/2019 0920   CL 108 03/19/2019 0920   CO2 25 03/19/2019 0920   BUN 13 03/19/2019 0920   BUN 12 01/10/2019 0839   CREATININE 1.10 (H) 03/19/2019 0920   CREATININE 0.67 05/28/2015 0958      Component Value Date/Time   CALCIUM 8.7 (L) 03/19/2019 0920   ALKPHOS 99 03/19/2019 0920   AST 17 03/19/2019 0920   ALT 13 03/19/2019 0920   BILITOT 0.7 03/19/2019 0920

## 2019-03-19 NOTE — Progress Notes (Signed)
Met with patient at registration to introduce myself as Arboriculturist and to offer available resources.  Discussed one-time $1000 Radio broadcast assistant to assist with personal expenses while going through treatment.  Gave her my card if interested in applying and for any additional financial questions or concerns.

## 2019-03-19 NOTE — Patient Instructions (Signed)
COVID-19 Vaccine Information can be found at: [email or web address] For questions related to vaccine distribution or appointments, please email [email or web address] or call [phone].   [facility] Discharge Instructions for Patients Receiving Chemotherapy  Today you received the following chemotherapy agents: Paclitaxel (Taxol) and Carboplatin (Paraplatin)  To help prevent nausea and vomiting after your treatment, we encourage you to take your nausea medication as directed by your provider.   If you develop nausea and vomiting that is not controlled by your nausea medication, call the clinic.   BELOW ARE SYMPTOMS THAT SHOULD BE REPORTED IMMEDIATELY:  *FEVER GREATER THAN 100.5 F  *CHILLS WITH OR WITHOUT FEVER  NAUSEA AND VOMITING THAT IS NOT CONTROLLED WITH YOUR NAUSEA MEDICATION  *UNUSUAL SHORTNESS OF BREATH  *UNUSUAL BRUISING OR BLEEDING  TENDERNESS IN MOUTH AND THROAT WITH OR WITHOUT PRESENCE OF ULCERS  *URINARY PROBLEMS  *BOWEL PROBLEMS  UNUSUAL RASH Items with * indicate a potential emergency and should be followed up as soon as possible.  Feel free to call the clinic should you have any questions or concerns. The clinic phone number is [phone].  Please show the [facility] at check-in to the Emergency Department and triage nurse.  Hyperglycemia Hyperglycemia is when the sugar (glucose) level in your blood is too high. It may not cause symptoms. If you do have symptoms, they may include warning signs, such as:  Feeling more thirsty than normal.  Hunger.  Feeling tired.  Needing to pee (urinate) more than normal.  Blurry eyesight (vision). You may get other symptoms as it gets worse, such as:  Dry mouth.  Not being hungry (loss of appetite).  Fruity-smelling breath.  Weakness.  Weight gain or loss that is not planned. Weight loss may be fast.  A tingling or numb  feeling in your hands or feet.  Headache.  Skin that does not bounce back quickly when it is lightly pinched and released (poor skin turgor).  Pain in your belly (abdomen).  Cuts or bruises that heal slowly. High blood sugar can happen to people who do or do not have diabetes. High blood sugar can happen slowly or quickly, and it can be an emergency. Follow these instructions at home: General instructions  Take over-the-counter and prescription medicines only as told by your doctor.  Do not use products that contain nicotine or tobacco, such as cigarettes and e-cigarettes. If you need help quitting, ask your doctor.  Limit alcohol intake to no more than 1 drink per day for nonpregnant women and 2 drinks per day for men. One drink equals 12 oz of beer, 5 oz of wine, or 1 oz of hard liquor.  Manage stress. If you need help with this, ask your doctor.  Keep all follow-up visits as told by your doctor. This is important. Eating and drinking   Stay at a healthy weight.  Exercise regularly, as told by your doctor.  Drink enough fluid, especially when you: ? Exercise. ? Get sick. ? Are in hot temperatures.  Eat healthy foods, such as: ? Low-fat (lean) proteins. ? Complex carbs (complex carbohydrates), such as whole wheat bread or brown rice. ? Fresh fruits and vegetables. ? Low-fat dairy products. ? Healthy fats.  Drink enough fluid to keep your pee (urine) clear or pale yellow. If you have diabetes:   Make sure you know the symptoms of hyperglycemia.  Follow your diabetes management plan, as told by your doctor. Make sure you: ? Take insulin and medicines as told. ?  Follow your exercise plan. ? Follow your meal plan. Eat on time. Do not skip meals. ? Check your blood sugar as often as told. Make sure to check before and after exercise. If you exercise longer or in a different way than you normally do, check your blood sugar more often. ? Follow your sick day plan  whenever you cannot eat or drink normally. Make this plan ahead of time with your doctor.  Share your diabetes management plan with people in your workplace, school, and household.  Check your urine for ketones when you are ill and as told by your doctor.  Carry a card or wear jewelry that says that you have diabetes. Contact a doctor if:  Your blood sugar level is higher than 240 mg/dL (13.3 mmol/L) for 2 days in a row.  You have problems keeping your blood sugar in your target range.  High blood sugar happens often for you. Get help right away if:  You have trouble breathing.  You have a change in how you think, feel, or act (mental status).  You feel sick to your stomach (nauseous), and that feeling does not go away.  You cannot stop throwing up (vomiting). These symptoms may be an emergency. Do not wait to see if the symptoms will go away. Get medical help right away. Call your local emergency services (911 in the [place] Do not drive yourself to the hospital. Summary  Hyperglycemia is when the sugar (glucose) level in your blood is too high.  High blood sugar can happen to people who do or do not have diabetes.  Make sure you drink enough fluids, eat healthy foods, and exercise regularly.  Contact your doctor if you have problems keeping your blood sugar in your target range. This information is not intended to replace advice given to you by your health care provider. Make sure you discuss any questions you have with your health care provider. Document Revised: 09/14/2015 Document Reviewed: 09/14/2015 Elsevier Patient Education  2020 [place] (COVID-19) Are you at risk?  Are you at risk for the Coronavirus (COVID-19)?  To be considered HIGH RISK for Coronavirus (COVID-19), you have to meet the following criteria:  . Traveled to [place], [place], [place], [place] or [place]; or in the [place] to [place], [place], [place], or [place]; and  have fever, cough, and shortness of breath within the last 2 weeks of travel OR . Been in close contact with a person diagnosed with COVID-19 within the last 2 weeks and have fever, cough, and shortness of breath . IF YOU DO NOT MEET THESE CRITERIA, YOU ARE CONSIDERED LOW RISK FOR COVID-19.  What to do if you are HIGH RISK for COVID-19?  [name] If you are having a medical emergency, call 911. . Seek medical care right away. Before you go to a doctor's office, urgent care or emergency department, call ahead and tell them about your recent travel, contact with someone diagnosed with COVID-19, and your symptoms. You should receive instructions from your physician's office regarding next steps of care.  . When you arrive at healthcare provider, tell the healthcare staff immediately you have returned from visiting [place], [place], [place], [place] or [place]; or traveled in the [place] to [place], [place], [place], or [place]; in the last two weeks or you have been in close contact with a person diagnosed with COVID-19 in the last 2 weeks.   . Tell the health care staff  about your symptoms: fever, cough and shortness of breath. . After you have been seen by a medical provider, you will be either: o Tested for (COVID-19) and discharged home on quarantine except to seek medical care if symptoms worsen, and asked to  - Stay home and avoid contact with others until you get your results (4-5 days)  - Avoid travel on public transportation if possible (such as bus, train, or airplane) or o Sent to the Emergency Department by EMS for evaluation, COVID-19 testing, and possible admission depending on your condition and test results.  What to do if you are LOW RISK for COVID-19?  Reduce your risk of any infection by using the same precautions used for avoiding the common cold or flu:  [name] Wash your hands often with soap and warm water for at least 20 seconds.  If soap and water are not readily available,  use an alcohol-based hand sanitizer with at least 60% alcohol.  . If coughing or sneezing, cover your mouth and nose by coughing or sneezing into the elbow areas of your shirt or coat, into a tissue or into your sleeve (not your hands). . Avoid shaking hands with others and consider head nods or verbal greetings only. . Avoid touching your eyes, nose, or mouth with unwashed hands.  . Avoid close contact with people who are sick. . Avoid places or events with large numbers of people in one location, like concerts or sporting events. . Carefully consider travel plans you have or are making. . If you are planning any travel outside or inside the [place], visit the CDC's Travelers' Health webpage for the latest health notices. . If you have some symptoms but not all symptoms, continue to monitor at home and seek medical attention if your symptoms worsen. . If you are having a medical emergency, call 911.   [place] / e-Visit: [email or web address]         [facility]: [facility]: [id number]                   [facility]: [phone]

## 2019-03-19 NOTE — Assessment & Plan Note (Signed)
So far, she tolerated treatment very well without major side effects We will proceed with treatment as scheduled

## 2019-03-19 NOTE — Assessment & Plan Note (Signed)
She has significant drug-induced hyperglycemia I will give her a shot of insulin today I advised her dietary modification while on treatment

## 2019-03-19 NOTE — Assessment & Plan Note (Signed)
Her renal function has improved I will adjust the dose of carboplatin accordingly

## 2019-03-20 ENCOUNTER — Ambulatory Visit (INDEPENDENT_AMBULATORY_CARE_PROVIDER_SITE_OTHER): Payer: Medicare Other | Admitting: Pharmacist

## 2019-03-20 DIAGNOSIS — I059 Rheumatic mitral valve disease, unspecified: Secondary | ICD-10-CM

## 2019-03-20 DIAGNOSIS — Z5181 Encounter for therapeutic drug level monitoring: Secondary | ICD-10-CM | POA: Diagnosis not present

## 2019-03-20 LAB — POCT INR: INR: 2.4 (ref 2.0–3.0)

## 2019-03-20 NOTE — Patient Instructions (Addendum)
Description   Take 2 tablets today then start taking 2 tablets daily except for 1.5 tablets on Monday, Wednesday, and Friday.  Recheck INR in 2 weeks. Call us with any medication changes or concerns to Coumadin Clinic @ [phone].

## 2019-03-22 ENCOUNTER — Telehealth: Payer: Self-pay | Admitting: *Deleted

## 2019-03-22 NOTE — Telephone Encounter (Signed)
CALLED PATIENT'S DAUGHTER TO REMIND OF HDR [place] 03-25-19 @ 9 AM, SPOKE WITH PATIENT'S [place] AND SHE IS AWARE OF THIS [place].

## 2019-03-24 NOTE — Progress Notes (Signed)
  Radiation Oncology         (336) [phone] ________________________________  Name: [name] MRN: [id number]  Date: 03/25/2019  DOB: January 03, 1956  CC: Lennie Odor, PA  [name], [place], [name]  HDR BRACHYTHERAPY NOTE  DIAGNOSIS: Stage II endometrial cancer, high-grade   Simple treatment device note: Patient had construction of her custom vaginal cylinder. She will be treated with a 3.0 cm diameter segmented cylinder. This conforms to her anatomy without undue discomfort.  Vaginal brachytherapy procedure node: The patient was brought to the [facility] suite. Identity was confirmed. All relevant records and images related to the planned course of therapy were reviewed. The patient freely provided informed written consent to proceed with treatment after reviewing the details related to the planned course of therapy. The consent form was witnessed and verified by the simulation staff. Then, the patient was set-up in a stable reproducible supine position for radiation therapy. Pelvic exam revealed the vaginal cuff to be intact . The patient's custom vaginal cylinder was placed in the proximal vagina. This was affixed to the CT/MR stabilization plate to prevent slippage. Patient tolerated the placement well.  Verification simulation note:  A fiducial marker was placed within the vaginal cylinder. An AP and lateral film was then obtained through the pelvis area. This documented accurate position of the vaginal cylinder for treatment.  HDR BRACHYTHERAPY TREATMENT  The remote afterloading device was affixed to the vaginal cylinder by catheter. Patient then proceeded to undergo her second high-dose-rate treatment directed at the proximal vagina. The patient was prescribed a dose of 6.0 gray to be delivered to the mucosal surface. Treatment length was 3.0 cm. Patient was treated with 1 channel using 7 dwell positions. Treatment time was 316.6 seconds. Iridium 192 was the high-dose-rate source for treatment. The  patient tolerated the treatment well. After completion of her therapy, a radiation survey was performed documenting return of the iridium source into the GammaMed safe.   PLAN: The patient will return next week for her third high-dose-rate treatment. ________________________________    Blair Promise, PhD, MD  This document serves as a record of services personally performed by Gery Pray, MD. It was created on his behalf by Clerance Lav, a trained medical scribe. The creation of this record is based on the scribe's personal observations and the provider's statements to them. This document has been checked and approved by the attending provider.

## 2019-03-25 ENCOUNTER — Ambulatory Visit
Admission: RE | Admit: 2019-03-25 | Discharge: 2019-03-25 | Disposition: A | Discharge status: Home / Self Care | Payer: Medicare Other | Source: Ambulatory Visit | Attending: Radiation Oncology | Admitting: Radiation Oncology

## 2019-03-25 ENCOUNTER — Other Ambulatory Visit: Payer: Self-pay

## 2019-03-25 DIAGNOSIS — C541 Malignant neoplasm of endometrium: Secondary | ICD-10-CM | POA: Diagnosis not present

## 2019-03-29 ENCOUNTER — Telehealth: Payer: Self-pay | Admitting: *Deleted

## 2019-03-29 NOTE — Telephone Encounter (Signed)
Called patient to remind of [place]. for 04-01-19 @ 9 am, spoke with patient and she is aware of this tx.

## 2019-03-31 NOTE — Progress Notes (Signed)
  Radiation Oncology         (336) [phone] ________________________________  Name: [name] MRN: [id number]  Date: 04/01/2019  DOB: 07/21/1955  CC: Lennie Odor, PA  [name], [place], [name]  HDR BRACHYTHERAPY NOTE  DIAGNOSIS: Stage II endometrial cancer, high-grade   Simple treatment device note: Patient had construction of her custom vaginal cylinder. She will be treated with a 3.0 cm diameter segmented cylinder. This conforms to her anatomy without undue discomfort.  Vaginal brachytherapy procedure node: The patient was brought to the [facility] suite. Identity was confirmed. All relevant records and images related to the planned course of therapy were reviewed. The patient freely provided informed written consent to proceed with treatment after reviewing the details related to the planned course of therapy. The consent form was witnessed and verified by the simulation staff. Then, the patient was set-up in a stable reproducible supine position for radiation therapy. Pelvic exam revealed the vaginal cuff to be intact . The patient's custom vaginal cylinder was placed in the proximal vagina. This was affixed to the CT/MR stabilization plate to prevent slippage. Patient tolerated the placement well.  Verification simulation note:  A fiducial marker was placed within the vaginal cylinder. An AP and lateral film was then obtained through the pelvis area. This documented accurate position of the vaginal cylinder for treatment.  HDR BRACHYTHERAPY TREATMENT  The remote afterloading device was affixed to the vaginal cylinder by catheter. Patient then proceeded to undergo her third high-dose-rate treatment directed at the proximal vagina. The patient was prescribed a dose of 6.0 gray to be delivered to the mucosal surface. Treatment length was 3.0 cm. Patient was treated with 1 channel using 7 dwell positions. Treatment time was 338.2 seconds. Iridium 192 was the high-dose-rate source for treatment. The  patient tolerated the treatment well. After completion of her therapy, a radiation survey was performed documenting return of the iridium source into the GammaMed safe.   PLAN: The patient will return next week for her fourth high-dose-rate treatment. ________________________________    Blair Promise, PhD, MD  This document serves as a record of services personally performed by Gery Pray, MD. It was created on his behalf by Clerance Lav, a trained medical scribe. The creation of this record is based on the scribe's personal observations and the provider's statements to them. This document has been checked and approved by the attending provider.

## 2019-04-01 ENCOUNTER — Ambulatory Visit
Admission: RE | Admit: 2019-04-01 | Discharge: 2019-04-01 | Disposition: A | Discharge status: Home / Self Care | Payer: Medicare Other | Source: Ambulatory Visit | Attending: Radiation Oncology | Admitting: Radiation Oncology

## 2019-04-01 ENCOUNTER — Other Ambulatory Visit: Payer: Self-pay

## 2019-04-01 DIAGNOSIS — C541 Malignant neoplasm of endometrium: Secondary | ICD-10-CM

## 2019-04-03 ENCOUNTER — Other Ambulatory Visit: Payer: Self-pay

## 2019-04-03 ENCOUNTER — Ambulatory Visit: Payer: Medicare Other

## 2019-04-03 DIAGNOSIS — Z5181 Encounter for therapeutic drug level monitoring: Secondary | ICD-10-CM

## 2019-04-03 DIAGNOSIS — I059 Rheumatic mitral valve disease, unspecified: Secondary | ICD-10-CM | POA: Diagnosis not present

## 2019-04-03 LAB — POCT INR: INR: 2.5 (ref 2.0–3.0)

## 2019-04-03 NOTE — Patient Instructions (Signed)
Description   Continue on same dosage 2 tablets daily except for 1.5 tablets on Mondays, Wednesdays, and Fridays.  Recheck INR in 3 weeks. Call us with any medication changes or concerns to Coumadin Clinic @ [phone].

## 2019-04-05 ENCOUNTER — Telehealth: Payer: Self-pay | Admitting: *Deleted

## 2019-04-05 NOTE — Telephone Encounter (Signed)
Called patient to remind of her HDR [place]. For 04-08-19 @ 9 am, spoke with patient's daughterGarvin Fila and she is aware of this tx.

## 2019-04-07 NOTE — Progress Notes (Signed)
  Radiation Oncology         (336) [phone] ________________________________  Name: NATHASHA MCGILLIVRAY MRN: [id number]  Date: 04/08/2019  DOB: 11/29/1955  CC: Lennie Odor, PA  [name], [place], [name]  HDR BRACHYTHERAPY NOTE  DIAGNOSIS: Stage II endometrial cancer, high-grade   Simple treatment device note: Patient had construction of her custom vaginal cylinder. She will be treated with a 3.0 cm diameter segmented cylinder. This conforms to her anatomy without undue discomfort.  Vaginal brachytherapy procedure node: The patient was brought to the [facility] suite. Identity was confirmed. All relevant records and images related to the planned course of therapy were reviewed. The patient freely provided informed written consent to proceed with treatment after reviewing the details related to the planned course of therapy. The consent form was witnessed and verified by the simulation staff. Then, the patient was set-up in a stable reproducible supine position for radiation therapy. Pelvic exam revealed the vaginal cuff to be intact . The patient's custom vaginal cylinder was placed in the proximal vagina. This was affixed to the CT/MR stabilization plate to prevent slippage. Patient tolerated the placement well.  Verification simulation note:  A fiducial marker was placed within the vaginal cylinder. An AP and lateral film was then obtained through the pelvis area. This documented accurate position of the vaginal cylinder for treatment.  HDR BRACHYTHERAPY TREATMENT  The remote afterloading device was affixed to the vaginal cylinder by catheter. Patient then proceeded to undergo her fourth high-dose-rate treatment directed at the proximal vagina. The patient was prescribed a dose of 6.0 gray to be delivered to the mucosal surface. Treatment length was 3.0 cm. Patient was treated with 1 channel using 7 dwell positions. Treatment time was 361.0 seconds. Iridium 192 was the high-dose-rate source for treatment. The  patient tolerated the treatment well. After completion of her therapy, a radiation survey was performed documenting return of the iridium source into the GammaMed safe.   PLAN: The patient will return next week for her fifth and final high-dose-rate treatment. ________________________________    Blair Promise, PhD, MD  This document serves as a record of services personally performed by Gery Pray, MD. It was created on his behalf by Clerance Lav, a trained medical scribe. The creation of this record is based on the scribe's personal observations and the provider's statements to them. This document has been checked and approved by the attending provider.

## 2019-04-08 ENCOUNTER — Other Ambulatory Visit: Payer: Self-pay

## 2019-04-08 ENCOUNTER — Ambulatory Visit
Admission: RE | Admit: 2019-04-08 | Discharge: 2019-04-08 | Disposition: A | Discharge status: Home / Self Care | Payer: Medicare Other | Source: Ambulatory Visit | Attending: Radiation Oncology | Admitting: Radiation Oncology

## 2019-04-08 DIAGNOSIS — C541 Malignant neoplasm of endometrium: Secondary | ICD-10-CM | POA: Diagnosis not present

## 2019-04-09 ENCOUNTER — Inpatient Hospital Stay: Payer: Medicare Other

## 2019-04-09 ENCOUNTER — Inpatient Hospital Stay (HOSPITAL_BASED_OUTPATIENT_CLINIC_OR_DEPARTMENT_OTHER): Payer: Medicare Other | Admitting: Medical

## 2019-04-09 ENCOUNTER — Inpatient Hospital Stay (HOSPITAL_BASED_OUTPATIENT_CLINIC_OR_DEPARTMENT_OTHER): Payer: Medicare Other | Admitting: Hematology and Oncology

## 2019-04-09 ENCOUNTER — Other Ambulatory Visit: Payer: Self-pay

## 2019-04-09 ENCOUNTER — Telehealth: Payer: Self-pay | Admitting: Hematology and Oncology

## 2019-04-09 ENCOUNTER — Other Ambulatory Visit: Payer: Self-pay | Admitting: Hematology and Oncology

## 2019-04-09 ENCOUNTER — Encounter: Payer: Self-pay | Admitting: Hematology and Oncology

## 2019-04-09 DIAGNOSIS — C541 Malignant neoplasm of endometrium: Secondary | ICD-10-CM

## 2019-04-09 DIAGNOSIS — E1169 Type 2 diabetes mellitus with other specified complication: Secondary | ICD-10-CM

## 2019-04-09 DIAGNOSIS — N183 Chronic kidney disease, stage 3 unspecified: Secondary | ICD-10-CM

## 2019-04-09 DIAGNOSIS — Z5111 Encounter for antineoplastic chemotherapy: Secondary | ICD-10-CM | POA: Diagnosis not present

## 2019-04-09 DIAGNOSIS — T80818A Extravasation of other vesicant agent, initial encounter: Secondary | ICD-10-CM

## 2019-04-09 LAB — CMP (CANCER CENTER ONLY)
ALT: 15 U/L (ref 0–44)
AST: 18 U/L (ref 15–41)
Albumin: 3.7 g/dL (ref 3.5–5.0)
Alkaline Phosphatase: 92 U/L (ref 38–126)
Anion gap: 13 (ref 5–15)
BUN: 17 mg/dL (ref 8–23)
CO2: 22 mmol/L (ref 22–32)
Calcium: 8.9 mg/dL (ref 8.9–10.3)
Chloride: 103 mmol/L (ref 98–111)
Creatinine: 1.22 mg/dL — ABNORMAL HIGH (ref 0.44–1.00)
GFR, Est AFR Am: 54 mL/min — ABNORMAL LOW (ref >60)
GFR, Estimated: 47 mL/min — ABNORMAL LOW (ref >60)
Glucose, Bld: 275 mg/dL — ABNORMAL HIGH (ref 70–99)
Potassium: 4.7 mmol/L (ref 3.5–5.1)
Sodium: 138 mmol/L (ref 135–145)
Total Bilirubin: 0.7 mg/dL (ref 0.3–1.2)
Total Protein: 7.3 g/dL (ref 6.5–8.1)

## 2019-04-09 LAB — CBC WITH DIFFERENTIAL (CANCER CENTER ONLY)
Abs Immature Granulocytes: 0.07 10*3/uL (ref 0.00–0.07)
Basophils Absolute: 0 10*3/uL (ref 0.0–0.1)
Basophils Relative: 0 %
Eosinophils Absolute: 0 10*3/uL (ref 0.0–0.5)
Eosinophils Relative: 0 %
HCT: 32.2 % — ABNORMAL LOW (ref 36.0–46.0)
Hemoglobin: 10.2 g/dL — ABNORMAL LOW (ref 12.0–15.0)
Immature Granulocytes: 1 %
Lymphocytes Relative: 7 %
Lymphs Abs: 0.6 10*3/uL — ABNORMAL LOW (ref 0.7–4.0)
MCH: 31.4 pg (ref 26.0–34.0)
MCHC: 31.7 g/dL (ref 30.0–36.0)
MCV: 99.1 fL (ref 80.0–100.0)
Monocytes Absolute: 0.2 10*3/uL (ref 0.1–1.0)
Monocytes Relative: 2 %
Neutro Abs: 7.3 10*3/uL (ref 1.7–7.7)
Neutrophils Relative %: 90 %
Platelet Count: 165 10*3/uL (ref 150–400)
RBC: 3.25 MIL/uL — ABNORMAL LOW (ref 3.87–5.11)
RDW: 14.7 % (ref 11.5–15.5)
WBC Count: 8.2 10*3/uL (ref 4.0–10.5)
nRBC: 0.2 % (ref 0.0–0.2)

## 2019-04-09 MED ORDER — PALONOSETRON HCL INJECTION 0.25 MG/5ML
INTRAVENOUS | Status: AC
  Filled 2019-04-09: qty 5

## 2019-04-09 MED ORDER — DIPHENHYDRAMINE HCL 50 MG/ML IJ SOLN
INTRAMUSCULAR | Status: AC
  Filled 2019-04-09: qty 1

## 2019-04-09 MED ORDER — INSULIN REGULAR HUMAN 100 UNIT/ML IJ SOLN
INTRAMUSCULAR | Status: AC
  Filled 2019-04-09: qty 1

## 2019-04-09 MED ORDER — DEXAMETHASONE SODIUM PHOSPHATE 10 MG/ML IJ SOLN
10.0000 mg | Freq: Once | INTRAMUSCULAR | Status: AC
  Administered 2019-04-09: 11:00:00 10 mg via INTRAVENOUS

## 2019-04-09 MED ORDER — SODIUM CHLORIDE 0.9 % IV SOLN
Freq: Once | INTRAVENOUS | Status: AC
  Filled 2019-04-09: qty 250

## 2019-04-09 MED ORDER — DEXAMETHASONE SODIUM PHOSPHATE 10 MG/ML IJ SOLN
INTRAMUSCULAR | Status: AC
  Filled 2019-04-09: qty 1

## 2019-04-09 MED ORDER — FAMOTIDINE IN NACL 20-0.9 MG/50ML-% IV SOLN
20.0000 mg | Freq: Once | INTRAVENOUS | Status: AC
  Administered 2019-04-09: 20 mg via INTRAVENOUS

## 2019-04-09 MED ORDER — INSULIN REGULAR HUMAN 100 UNIT/ML IJ SOLN
10.0000 [IU] | Freq: Once | INTRAMUSCULAR | Status: AC
  Administered 2019-04-09: 11:00:00 10 [IU] via SUBCUTANEOUS

## 2019-04-09 MED ORDER — FAMOTIDINE IN NACL 20-0.9 MG/50ML-% IV SOLN
INTRAVENOUS | Status: AC
  Filled 2019-04-09: qty 50

## 2019-04-09 MED ORDER — PALONOSETRON HCL INJECTION 0.25 MG/5ML
0.2500 mg | Freq: Once | INTRAVENOUS | Status: AC
  Administered 2019-04-09: 11:00:00 0.25 mg via INTRAVENOUS

## 2019-04-09 MED ORDER — SODIUM CHLORIDE 0.9 % IV SOLN
600.0000 mg | Freq: Once | INTRAVENOUS | Status: AC
  Administered 2019-04-09: 16:00:00 600 mg via INTRAVENOUS
  Filled 2019-04-09: qty 60

## 2019-04-09 MED ORDER — SODIUM CHLORIDE 0.9 % IV SOLN
140.0000 mg/m2 | Freq: Once | INTRAVENOUS | Status: AC
  Administered 2019-04-09: 13:00:00 306 mg via INTRAVENOUS
  Filled 2019-04-09: qty 51

## 2019-04-09 MED ORDER — DIPHENHYDRAMINE HCL 50 MG/ML IJ SOLN
50.0000 mg | Freq: Once | INTRAMUSCULAR | Status: AC
  Administered 2019-04-09: 11:00:00 50 mg via INTRAVENOUS

## 2019-04-09 MED ORDER — COLD PACK MISC ONCOLOGY
1.0000 | Freq: Once | Status: DC | PRN
  Filled 2019-04-09: qty 1

## 2019-04-09 MED ORDER — SODIUM CHLORIDE 0.9 % IV SOLN
150.0000 mg | Freq: Once | INTRAVENOUS | Status: AC
  Administered 2019-04-09: 11:00:00 150 mg via INTRAVENOUS
  Filled 2019-04-09: qty 150

## 2019-04-09 NOTE — Progress Notes (Signed)
1600-- Patient's IV pump was beeping for an occlusion. IV site appeared slightly swollen but patient denied pain or discomfort at site. R arm elevated and ice pack was applied to site. Sandi Mealy, PA was notified to see patient in infusion. Attempted to aspirate site and was able to obtain <1 mL fluid. IV restarted at different site and patient was able to complete her treatment.  Patient instructed to ice area 3 times a day and keep extremity elevated. Site wrapped with coban and patient instructed to keep area covered to avoid direct sun exposure. Patient to come back to [facility] on 04/10/19, 04/11/19, and next week to have site evaluated. Patient instructed to call office if she has any questions or concerns. Patient verbalized understanding and all questions were answered.

## 2019-04-09 NOTE — Telephone Encounter (Signed)
Scheduled per 3/30 sch msg. Called pt no answer unable to leave a msg. Mailing printout

## 2019-04-09 NOTE — Assessment & Plan Note (Signed)
Her renal function has improved I will adjust the dose of carboplatin accordingly

## 2019-04-09 NOTE — Progress Notes (Signed)
[place] OFFICE PROGRESS NOTE  Patient Care Team: [name], [name] as PCP - General (Nurse Practitioner) Jettie Booze, MD as PCP - Cardiology (Cardiology) Kelton Pillar, MD as Attending Physician (Family Medicine) Jettie Booze, MD (Cardiology) Jettie Booze, MD as Attending Physician (Cardiology)  ASSESSMENT & PLAN:  Endometrial cancer [facility]) So far, she tolerated treatment very well without major side effects, except for drug-induced hyperglycemia and intermittent fluctuation of her creatinine We will proceed with treatment as scheduled  CKD (chronic kidney disease) stage 3, GFR 30-59 ml/min Her renal function has improved I will adjust the dose of carboplatin accordingly  Diabetes mellitus ([place]) She has significant drug-induced hyperglycemia I will give her a shot of insulin today I advised her dietary modification while on treatment   No orders of the defined types were placed in this encounter.   All questions were answered. The patient knows to call the clinic with any problems, questions or concerns. The total time spent in the appointment was 20 minutes encounter with patients including review of chart and various tests results, discussions about plan of care and coordination of care plan   Heath Lark, MD 04/09/2019 10:20 AM  INTERVAL HISTORY: Please see below for problem oriented charting. She returns for cycle 4 of treatment She denies nausea or changes in bowel habits No peripheral neuropathy Denies recent infection, fever or chills Overall, she felt that she tolerated treatment fairly well  SUMMARY OF ONCOLOGIC HISTORY: Oncology History Overview Note  Clear cell features MSI stable   Endometrial cancer ([place])  09/24/2018 Initial Diagnosis   She presented with post menopausal bleeding   11/20/2018 Initial Biopsy   EMB 11/10: G3 carcinoma with clear cell features.  Pap 11/10: AGC    12/03/2018 Surgery   TRH/BSO,  bil SLNs   12/05/2018 Imaging   CT C/A/P:  1. Low attenuation mass or fluid in the endometrial cavity (series 2, image 110), in keeping with known endometrial malignancy. 2. Numerous small bilateral ground-glass pulmonary nodules. Although nonspecific these are likely infectious or inflammatory given appearance, isolated manifestation of metastatic disease much less favored. 3. Enlarged mediastinal and hilar lymph nodes, likely reactive given pulmonary findings. 4. No evidence of metastatic disease in the abdomen or pelvis. 5. Nonobstructive right nephrolithiasis. 6. Aortic Atherosclerosis (ICD10-I70.0).   01/02/2019 Pathologic Stage   Stage II Gr3 (clear cell features), no LVSI, cervial stroma involved, <50% MI MSI-low  A. 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            Tumor limited to upper half of myometrium.            Leiomyoma.            See oncology table.       Serosa: Fibrous adhesions with endosalpingiosis. No malignancy.  - Cervix: Stroma involved by tumor.  - Bilateral ovaries: Inclusion cysts. No malignancy.  - Bilateral fallopian tubes: Unremarkable. No malignancy.   B. LYMPH NODE, RIGHT OBTURATOR, SENTINEL, BIOPSY:  - One of one lymph nodes negative for carcinoma (0/1).   C. LYMPH NODE, LEFT EXTERNAL ILIAC, SENTINEL, BIOPSY:  - One of one lymph nodes negative for carcinoma (0/1).   D. LYMPH NODE, LEFT EXTERNAL ILIAC, BIOPSY:  - One of one lymph nodes negative for carcinoma (0/1).   ONCOLOGY TABLE:   UTERUS, CARCINOMA OR CARCINOSARCOMA  Procedure: Total hysterectomy and bilateral salpingo-oophorectomy  Histologic type: Poorly differentiated carcinoma with clear cell  features, see comment.  Histologic Grade: High-grade  Myometrial invasion:       Depth of invasion:9 mm       Myometrial thickness: 20 mm   Uterine Serosa Involvement: Not identified  Cervical stromal involvement: Present  Extent of involvement of other organs: Not identified  Lymphovascular invasion: Not identified  Regional Lymph Nodes:       Examined:     3 Sentinel                               0 non-sentinel                               3 total        Lymph nodes with metastasis: 0        Isolated tumor cells (<0.2 mm): 0        Micrometastasis:  (>0.2 mm and < 2.0 mm): 0        Macrometastasis: (>2.0 mm): 0        Extracapsular extension: Not applicable  Representative Tumor Block: A5  MMR / MSI testing: Will be ordered  Pathologic Stage Classification (pTNM, AJCC 8th edition):  pT2, pNX  Comments: The tumor consists of solid sheets and more tubulocystic areas with focal areas of cytoplasmic clearing. Immunohistochemistry is positive for racemase (weak), Napsin-A (focal), and p53 (wild type). ER and PR are negative. While extensive clear cell changes are not seen in the more solid components, the immunoprofile along with more definitive areas of clear cell changes favor the overall tumor to be a clear cell carcinoma. The tumor has a more pushing type of invasion. There is stromal invasion in areas of endocervical mucosa. Immunohistochemistry on the serosa adhesions reveals mesothelial cells (calretinin) and a small focus of endosalpingiosis (PAX-8, ER, MOC31). Pancytokeratin on the lymph nodes is negative.    01/18/2019 Cancer Staging   Staging form: Corpus Uteri - Carcinoma and Carcinosarcoma, AJCC 8th Edition - Pathologic: Stage II (pT2, pN0, cM0) - Signed by Heath Lark, MD on 01/18/2019   02/04/2019 -  Chemotherapy   The patient had palonosetron (ALOXI) injection 0.25 mg, 0.25 mg, Intravenous,  Once, 3 of 6 cycles Administration: 0.25 mg (02/04/2019), 0.25 mg (02/25/2019), 0.25 mg (03/19/2019) CARBOplatin (PARAPLATIN) 490 mg in sodium chloride 0.9 % 250 mL chemo infusion, 490 mg (65.5 % of original dose 750 mg), Intravenous,   Once, 3 of 6 cycles Dose modification: 750 mg (original dose 750 mg, Cycle 1, Reason: Provider Judgment), 491.4 mg (original dose 750 mg, Cycle 1, Reason: Change in SCr/CrCl),   (original dose 750 mg, Cycle 3, Reason: Provider Judgment) Administration: 490 mg (02/04/2019), 490 mg (02/25/2019), 650 mg (03/19/2019) fosaprepitant (EMEND) 150 mg in sodium chloride 0.9 % 145 mL IVPB, 150 mg, Intravenous,  Once, 3 of 6 cycles Administration: 150 mg (02/04/2019), 150 mg (02/25/2019), 150 mg (03/19/2019) PACLitaxel (TAXOL) 312 mg in sodium chloride 0.9 % 500 mL chemo infusion (> 28m/m2), 140 mg/m2 = 312 mg (80 % of original dose 175 mg/m2), Intravenous,  Once, 3 of 6 cycles Dose modification: 140 mg/m2 (80 % of original dose 175 mg/m2, Cycle 1, Reason: Provider Judgment) Administration: 312 mg (02/04/2019), 300 mg (02/25/2019), 306 mg (03/19/2019)  for chemotherapy treatment.      REVIEW OF SYSTEMS:  Constitutional: Denies fevers, chills or abnormal weight loss Eyes: Denies blurriness of vision Ears, nose, mouth, throat, and face: Denies mucositis or sore throat Respiratory: Denies cough, dyspnea or wheezes Cardiovascular: Denies palpitation, chest discomfort or lower extremity swelling Gastrointestinal:  Denies nausea, heartburn or change in bowel habits Skin: Denies abnormal skin rashes Lymphatics: Denies new lymphadenopathy or easy bruising Neurological:Denies numbness, tingling or new weaknesses Behavioral/Psych: Mood is stable, no new changes  All other systems were reviewed with the patient and are negative.  I have reviewed the past medical history, past surgical history, social history and family history with the patient and they are unchanged from previous note.  ALLERGIES:  is allergic to chlorhexidine.  MEDICATIONS:  Current Outpatient Medications  Medication Sig Dispense Refill  . amiodarone (PACERONE) 200 MG tablet TAKE 1 TABLET(200 MG) BY MOUTH DAILY (Patient taking differently: Take 200  mg by mouth daily. ) 90 tablet 3  . amLODipine (NORVASC) 5 MG tablet Take 1 tablet (5 mg total) by mouth daily. 180 tablet 3  . atorvastatin (LIPITOR) 40 MG tablet Take 1 tablet (40 mg total) by mouth daily. 90 tablet 3  . calcium carbonate (OS-CAL - DOSED IN MG OF ELEMENTAL CALCIUM) 1250 MG tablet Take 1 tablet by mouth daily.      . carvedilol (COREG) 12.5 MG tablet Take 1 tablet (12.5 mg total) by mouth 2 (two) times daily with a meal. 180 tablet 3  . Cholecalciferol (VITAMIN D) 1000 UNITS capsule Take 1,000 Units by mouth daily.     . cyclobenzaprine (FLEXERIL) 10 MG tablet Take 10 mg by mouth daily.     [name] dexamethasone (DECADRON) 4 MG tablet Take 2 tabs at the night before chemotherapy, every 3 weeks, by mouth x 6 cycles, please dispense 12 tabs 60 tablet 0  . insulin glargine (LANTUS SOLOSTAR) 100 UNIT/ML injection Inject 30-35 Units into the skin See admin instructions. Inject 30 units into the skin in the morning and 35 units at bedtime.    . irbesartan (AVAPRO) 300 MG tablet Take 1 tablet (300 mg total) by mouth daily. 90 tablet 3  . metFORMIN (GLUCOPHAGE) 500 MG tablet Take 500 mg by mouth 2 (two) times daily with a meal.     . methocarbamol (ROBAXIN) 500 MG tablet Take 500 mg by mouth every 8 (eight) hours as needed for muscle spasms.     . ondansetron (ZOFRAN) 8 MG tablet Take 1 tablet (8 mg total) by mouth every 8 (eight) hours as needed. 30 tablet 1  . prochlorperazine (COMPAZINE) 10 MG tablet Take 1 tablet (10 mg total) by mouth every 6 (six) hours as needed (Nausea or vomiting). 30 tablet 1  . senna (SENOKOT) 8.6 MG TABS tablet Take 1 tablet (8.6 mg total) by mouth daily as needed for up to 15 doses for mild constipation. 15 tablet 0  . warfarin (COUMADIN) 4 MG tablet Take 1 to 1.5 tablets daily as directed by [facility] 135 tablet 1   No current facility-administered medications for this visit.    PHYSICAL EXAMINATION: ECOG PERFORMANCE STATUS: 0 - Asymptomatic  Vitals:    04/09/19 0959  BP: (!) 149/76  Pulse: 83  Resp: 18  Temp: 98 F (36.7 C)  SpO2: 99%   Filed Weights   04/09/19 0959  Weight: 229 lb 6.4 oz (104.1 kg)    GENERAL:alert, no distress and comfortable SKIN: skin color, texture, turgor are normal, no rashes or significant lesions EYES: normal, Conjunctiva are pink and non-injected, sclera  clear OROPHARYNX:no exudate, no erythema and lips, buccal mucosa, and tongue normal  NECK: supple, thyroid normal size, non-tender, without nodularity LYMPH:  no palpable lymphadenopathy in the cervical, axillary or inguinal LUNGS: clear to auscultation and percussion with normal breathing effort HEART: regular rate & rhythm and no murmurs and no lower extremity edema ABDOMEN:abdomen soft, non-tender and normal bowel sounds Musculoskeletal:no cyanosis of digits and no clubbing  NEURO: alert & oriented x 3 with fluent speech, no focal motor/sensory deficits  LABORATORY DATA:  I have reviewed the data as listed    Component Value Date/Time   NA 138 04/09/2019 0922   NA 140 01/10/2019 0839   K 4.7 04/09/2019 0922   CL 103 04/09/2019 0922   CO2 22 04/09/2019 0922   GLUCOSE 275 (H) 04/09/2019 0922   BUN 17 04/09/2019 0922   BUN 12 01/10/2019 0839   CREATININE 1.22 (H) 04/09/2019 0922   CREATININE 0.67 05/28/2015 0958   CALCIUM 8.9 04/09/2019 0922   PROT 7.3 04/09/2019 0922   PROT 7.7 10/03/2016 0936   ALBUMIN 3.7 04/09/2019 0922   ALBUMIN 4.4 10/03/2016 0936   AST 18 04/09/2019 0922   ALT 15 04/09/2019 0922   ALKPHOS 92 04/09/2019 0922   BILITOT 0.7 04/09/2019 0922   GFRNONAA 47 (L) 04/09/2019 0922   GFRAA 54 (L) 04/09/2019 0922    No results found for: SPEP, UPEP  Lab Results  Component Value Date   WBC 8.2 04/09/2019   NEUTROABS 7.3 04/09/2019   HGB 10.2 (L) 04/09/2019   HCT 32.2 (L) 04/09/2019   MCV 99.1 04/09/2019   PLT 165 04/09/2019      Chemistry      Component Value Date/Time   NA 138 04/09/2019 0922   NA 140  01/10/2019 0839   K 4.7 04/09/2019 0922   CL 103 04/09/2019 0922   CO2 22 04/09/2019 0922   BUN 17 04/09/2019 0922   BUN 12 01/10/2019 0839   CREATININE 1.22 (H) 04/09/2019 0922   CREATININE 0.67 05/28/2015 0958      Component Value Date/Time   CALCIUM 8.9 04/09/2019 0922   ALKPHOS 92 04/09/2019 0922   AST 18 04/09/2019 0922   ALT 15 04/09/2019 0922   BILITOT 0.7 04/09/2019 6270

## 2019-04-09 NOTE — Patient Instructions (Signed)
[facility] Discharge Instructions for Patients Receiving Chemotherapy  Today you received the following chemotherapy agents: Paclitaxel (Taxol) and Carboplatin (Paraplatin)  To help prevent nausea and vomiting after your treatment, we encourage you to take your nausea medication as directed by your provider.   If you develop nausea and vomiting that is not controlled by your nausea medication, call the clinic.   BELOW ARE SYMPTOMS THAT SHOULD BE REPORTED IMMEDIATELY:  *FEVER GREATER THAN 100.5 F  *CHILLS WITH OR WITHOUT FEVER  NAUSEA AND VOMITING THAT IS NOT CONTROLLED WITH YOUR NAUSEA MEDICATION  *UNUSUAL SHORTNESS OF BREATH  *UNUSUAL BRUISING OR BLEEDING  TENDERNESS IN MOUTH AND THROAT WITH OR WITHOUT PRESENCE OF ULCERS  *URINARY PROBLEMS  *BOWEL PROBLEMS  UNUSUAL RASH Items with * indicate a potential emergency and should be followed up as soon as possible.  Feel free to call the clinic should you have any questions or concerns. The clinic phone number is [phone].  Please show the [facility] at check-in to the Emergency Department and triage nurse.  Hyperglycemia Hyperglycemia is when the sugar (glucose) level in your blood is too high. It may not cause symptoms. If you do have symptoms, they may include warning signs, such as:  Feeling more thirsty than normal.  Hunger.  Feeling tired.  Needing to pee (urinate) more than normal.  Blurry eyesight (vision). You may get other symptoms as it gets worse, such as:  Dry mouth.  Not being hungry (loss of appetite).  Fruity-smelling breath.  Weakness.  Weight gain or loss that is not planned. Weight loss may be fast.  A tingling or numb feeling in your hands or feet.  Headache.  Skin that does not bounce back quickly when it is lightly pinched and released (poor skin turgor).  Pain in your belly (abdomen).  Cuts or bruises that heal slowly. High blood sugar can happen to people  who do or do not have diabetes. High blood sugar can happen slowly or quickly, and it can be an emergency. Follow these instructions at home: General instructions  Take over-the-counter and prescription medicines only as told by your doctor.  Do not use products that contain nicotine or tobacco, such as cigarettes and e-cigarettes. If you need help quitting, ask your doctor.  Limit alcohol intake to no more than 1 drink per day for nonpregnant women and 2 drinks per day for men. One drink equals 12 oz of beer, 5 oz of wine, or 1 oz of hard liquor.  Manage stress. If you need help with this, ask your doctor.  Keep all follow-up visits as told by your doctor. This is important. Eating and drinking   Stay at a healthy weight.  Exercise regularly, as told by your doctor.  Drink enough fluid, especially when you: ? Exercise. ? Get sick. ? Are in hot temperatures.  Eat healthy foods, such as: ? Low-fat (lean) proteins. ? Complex carbs (complex carbohydrates), such as whole wheat bread or brown rice. ? Fresh fruits and vegetables. ? Low-fat dairy products. ? Healthy fats.  Drink enough fluid to keep your pee (urine) clear or pale yellow. If you have diabetes:   Make sure you know the symptoms of hyperglycemia.  Follow your diabetes management plan, as told by your doctor. Make sure you: ? Take insulin and medicines as told. ? Follow your exercise plan. ? Follow your meal plan. Eat on time. Do not skip meals. ? Check your blood sugar as often as  told. Make sure to check before and after exercise. If you exercise longer or in a different way than you normally do, check your blood sugar more often. ? Follow your sick day plan whenever you cannot eat or drink normally. Make this plan ahead of time with your doctor.  Share your diabetes management plan with people in your workplace, school, and household.  Check your urine for ketones when you are ill and as told by your  doctor.  Carry a card or wear jewelry that says that you have diabetes. Contact a doctor if:  Your blood sugar level is higher than 240 mg/dL (13.3 mmol/L) for 2 days in a row.  You have problems keeping your blood sugar in your target range.  High blood sugar happens often for you. Get help right away if:  You have trouble breathing.  You have a change in how you think, feel, or act (mental status).  You feel sick to your stomach (nauseous), and that feeling does not go away.  You cannot stop throwing up (vomiting). These symptoms may be an emergency. Do not wait to see if the symptoms will go away. Get medical help right away. Call your local emergency services (911 in the [place] Do not drive yourself to the hospital. Summary  Hyperglycemia is when the sugar (glucose) level in your blood is too high.  High blood sugar can happen to people who do or do not have diabetes.  Make sure you drink enough fluids, eat healthy foods, and exercise regularly.  Contact your doctor if you have problems keeping your blood sugar in your target range. This information is not intended to replace advice given to you by your health care provider. Make sure you discuss any questions you have with your health care provider. Document Revised: 09/14/2015 Document Reviewed: 09/14/2015 Elsevier Patient Education  [place].

## 2019-04-09 NOTE — Assessment & Plan Note (Signed)
So far, she tolerated treatment very well without major side effects, except for drug-induced hyperglycemia and intermittent fluctuation of her creatinine We will proceed with treatment as scheduled

## 2019-04-09 NOTE — Assessment & Plan Note (Signed)
She has significant drug-induced hyperglycemia I will give her a shot of insulin today I advised her dietary modification while on treatment

## 2019-04-10 ENCOUNTER — Inpatient Hospital Stay (HOSPITAL_BASED_OUTPATIENT_CLINIC_OR_DEPARTMENT_OTHER): Payer: Medicare Other | Admitting: Medical

## 2019-04-10 ENCOUNTER — Other Ambulatory Visit: Payer: Self-pay

## 2019-04-10 DIAGNOSIS — T80818D Extravasation of other vesicant agent, subsequent encounter: Secondary | ICD-10-CM

## 2019-04-10 NOTE — Progress Notes (Signed)
   DATE:    04/09/2019      IV EXTRAVASATION (IRRITANT):  Carboplatin  MD:    Dr. Heath Lark  AGENT RECEIVED AT TIME OF EXTRAVASATION:   Carboplatin  IV SITE LOCATION: Right posterior wrist  INTERVENTION:  1) IV stopped  2) < 1 ml liquid/blood aspirated from IV site.  3) Patient Teaching Instruction Sheet reviewed with [name].  A) Apply cold to area for 15 to 20 minutes 3 times daily for the next 48 to 72 hours B) Elevate the affected site for the next 48 hours. C) Coban dressing applied to area. Patient instructed to protect the area from sunlight. D) Return for evaluation in 24 hours, 48 hours, and 7 days. E) [name] was instructed to call [phone] if she has questions or notes acute changes to the area.  Review of Systems  Constitutional: Negative for chills and diaphoresis.  Skin:       Diffuse area of swelling proximal to the right posterior IV insertion site.    Physical Exam Constitutional:      General: She is not in acute distress.    Appearance: Normal appearance. She is not ill-appearing.  HENT:     Head: Normocephalic and atraumatic.  Skin:    General: Skin is warm and dry.     Comments: There was an area of diffuse swelling proximal to the IV site in the right posterior wrist.  There was no erythema, warmth, or skin changes.  No vesicles or skin breakdown were appreciated.   Neurological:     Mental Status: She is alert.  Psychiatric:        Mood and Affect: Mood normal.        Behavior: Behavior normal.        Thought Content: Thought content normal.        Judgment: Judgment normal.       Sandi Mealy, MHS, PA-C

## 2019-04-10 NOTE — Progress Notes (Signed)
The patient was seen in follow-up of an extravasation of carboplatin yesterday.  The area was evaluated today.  She continued to have minimal residual soft fullness immediately proximal to the IV insertion site in the right posterior wrist.  There was no evidence of skin breakdown, erythema, warmth, vesicles, or exudate.  She will return tomorrow for a 48-hour evaluation and then will be seen next Wednesday.  A Coban dressing was reapplied to the area.  The patient was given instructions to put an ice pack on the site for 30 minutes 3 times daily for the first 48 to 72 hours.  Sandi Mealy, MHS, PA-C Physician Assistant

## 2019-04-11 ENCOUNTER — Inpatient Hospital Stay: Payer: Medicare Other | Attending: Gynecologic Oncology | Admitting: Medical

## 2019-04-11 ENCOUNTER — Other Ambulatory Visit: Payer: Self-pay

## 2019-04-11 DIAGNOSIS — Z5111 Encounter for antineoplastic chemotherapy: Secondary | ICD-10-CM | POA: Insufficient documentation

## 2019-04-11 DIAGNOSIS — T80818D Extravasation of other vesicant agent, subsequent encounter: Secondary | ICD-10-CM

## 2019-04-11 DIAGNOSIS — C541 Malignant neoplasm of endometrium: Secondary | ICD-10-CM | POA: Insufficient documentation

## 2019-04-11 NOTE — Progress Notes (Signed)
Pt arrived early for extravasation recheck, PA Lucianne Lei not in [place] at time of arrival.  Pt assessed by [facility] RN.  Mild swelling beneath skin on R lower forearm that appears unchanged from previous recheck on 3/31.  Pt reports spot is mildly tender but denies any changes in ROM/strength/sensation in R arm/hand.  No redness, heat, bruising, drainage/open wound at site.  Spoke with PA Lucianne Lei by phone who was given report of site, verbal order to d/c pt at this time.  Pt aware to f/u as needed before her next appt on 04/15/19 with radiation.

## 2019-04-11 NOTE — Progress Notes (Signed)
Ms. Tier was evaluated by Ruben Im, RN this morning. The area of extravasation in the right posterior wrist was intact with no vesicles, skin breakdown, hypopigmentation, hyperpigmentation, exudate, erythema, or increased warmth noted. Ruben Im, RN took a picture of the area which I examined an only noted an area of minimal diffuse swelling proximal to the area where an IV had been placed. The patient had no issues of concern. She will be seen again next week.   Sandi Mealy, MHS, PA-C Physician Assistant

## 2019-04-11 NOTE — Patient Instructions (Signed)

## 2019-04-12 ENCOUNTER — Telehealth: Payer: Self-pay | Admitting: *Deleted

## 2019-04-12 NOTE — Telephone Encounter (Signed)
CALLED PATIENT TO REMIND OF HDR TX. FOR 04-15-19 @ 9 AM, LVM FOR A RETURN CALL

## 2019-04-14 NOTE — Progress Notes (Signed)
  Radiation Oncology         [phone] ________________________________  Name: [name] MRN: [id number]  Date: 04/15/2019  DOB: 03-Oct-1955  CC: Lennie Odor, PA  [name], [place], [name]  HDR BRACHYTHERAPY NOTE  DIAGNOSIS: Stage II endometrial cancer, high-grade   Simple treatment device note: Patient had construction of her custom vaginal cylinder. She will be treated with a 3.0 cm diameter segmented cylinder. This conforms to her anatomy without undue discomfort.  Vaginal brachytherapy procedure node: The patient was brought to the [facility] suite. Identity was confirmed. All relevant records and images related to the planned course of therapy were reviewed. The patient freely provided informed written consent to proceed with treatment after reviewing the details related to the planned course of therapy. The consent form was witnessed and verified by the simulation staff. Then, the patient was set-up in a stable reproducible supine position for radiation therapy. Pelvic exam revealed the vaginal cuff to be intact . The patient's custom vaginal cylinder was placed in the proximal vagina. This was affixed to the CT/MR stabilization plate to prevent slippage. Patient tolerated the placement well.  Verification simulation note:  A fiducial marker was placed within the vaginal cylinder. An AP and lateral film was then obtained through the pelvis area. This documented accurate position of the vaginal cylinder for treatment.  HDR BRACHYTHERAPY TREATMENT  The remote afterloading device was affixed to the vaginal cylinder by catheter. Patient then proceeded to undergo her fifth high-dose-rate treatment directed at the proximal vagina. The patient was prescribed a dose of 6.0 gray to be delivered to the mucosal surface. Treatment length was 3.0 cm. Patient was treated with 1 channel using 7 dwell positions. Treatment time was 385.6 seconds. Iridium 192 was the high-dose-rate source for treatment. The  patient tolerated the treatment well. After completion of her therapy, a radiation survey was performed documenting return of the iridium source into the [employer] safe.   PLAN: Patient has completed her planned course of vaginal brachytherapy.  the patient will return in one month for routine follow-up.  She will continue with 2 more cycles of adjuvant chemotherapy. ________________________________    Blair Promise, PhD, MD  This document serves as a record of services personally performed by Gery Pray, MD. It was created on his behalf by Clerance Lav, a trained medical scribe. The creation of this record is based on the scribe's personal observations and the provider's statements to them. This document has been checked and approved by the attending provider.

## 2019-04-15 ENCOUNTER — Encounter: Payer: Self-pay | Admitting: Radiation Oncology

## 2019-04-15 ENCOUNTER — Ambulatory Visit
Admission: RE | Admit: 2019-04-15 | Discharge: 2019-04-15 | Disposition: A | Discharge status: Home / Self Care | Payer: Medicare Other | Source: Ambulatory Visit | Attending: Radiation Oncology | Admitting: Radiation Oncology

## 2019-04-15 ENCOUNTER — Other Ambulatory Visit: Payer: Self-pay

## 2019-04-15 DIAGNOSIS — C541 Malignant neoplasm of endometrium: Secondary | ICD-10-CM | POA: Diagnosis not present

## 2019-04-24 ENCOUNTER — Ambulatory Visit: Payer: Medicare Other | Admitting: *Deleted

## 2019-04-24 ENCOUNTER — Other Ambulatory Visit: Payer: Self-pay

## 2019-04-24 DIAGNOSIS — Z5181 Encounter for therapeutic drug level monitoring: Secondary | ICD-10-CM

## 2019-04-24 DIAGNOSIS — I059 Rheumatic mitral valve disease, unspecified: Secondary | ICD-10-CM

## 2019-04-24 LAB — POCT INR: INR: 2.7 (ref 2.0–3.0)

## 2019-04-24 NOTE — Patient Instructions (Signed)
Description   Continue on same dosage 2 tablets daily except for 1.5 tablets on Mondays, Wednesdays, and Fridays. Recheck INR in 4 weeks. Call us with any medication changes or concerns to Coumadin Clinic @ [phone].

## 2019-04-24 NOTE — Progress Notes (Signed)
Pharmacist Chemotherapy Monitoring - Follow Up Assessment    I verify that I have reviewed each item in the below checklist:  . Regimen for the patient is scheduled for the appropriate day and plan matches scheduled date. [name] Appropriate non-routine labs are ordered dependent on drug ordered. . If applicable, additional medications reviewed and ordered per protocol based on lifetime cumulative doses and/or treatment regimen.   Plan for follow-up and/or issues identified: No . I-vent associated with next due treatment: No . MD and/or nursing notified: No\  Jeralyn Nolden D 04/24/2019 11:36 AM

## 2019-04-30 ENCOUNTER — Other Ambulatory Visit: Payer: Self-pay | Admitting: Hematology and Oncology

## 2019-04-30 ENCOUNTER — Encounter: Payer: Self-pay | Admitting: Hematology and Oncology

## 2019-04-30 ENCOUNTER — Inpatient Hospital Stay: Payer: Medicare Other

## 2019-04-30 ENCOUNTER — Other Ambulatory Visit: Payer: Self-pay

## 2019-04-30 ENCOUNTER — Inpatient Hospital Stay (HOSPITAL_BASED_OUTPATIENT_CLINIC_OR_DEPARTMENT_OTHER): Payer: Medicare Other | Admitting: Hematology and Oncology

## 2019-04-30 DIAGNOSIS — C541 Malignant neoplasm of endometrium: Secondary | ICD-10-CM | POA: Diagnosis not present

## 2019-04-30 DIAGNOSIS — T50905A Adverse effect of unspecified drugs, medicaments and biological substances, initial encounter: Secondary | ICD-10-CM | POA: Diagnosis not present

## 2019-04-30 DIAGNOSIS — R739 Hyperglycemia, unspecified: Secondary | ICD-10-CM | POA: Diagnosis not present

## 2019-04-30 DIAGNOSIS — N183 Chronic kidney disease, stage 3 unspecified: Secondary | ICD-10-CM | POA: Diagnosis not present

## 2019-04-30 DIAGNOSIS — Z5111 Encounter for antineoplastic chemotherapy: Secondary | ICD-10-CM | POA: Diagnosis present

## 2019-04-30 LAB — CBC WITH DIFFERENTIAL (CANCER CENTER ONLY)
Abs Immature Granulocytes: 0.12 10*3/uL — ABNORMAL HIGH (ref 0.00–0.07)
Basophils Absolute: 0 10*3/uL (ref 0.0–0.1)
Basophils Relative: 0 %
Eosinophils Absolute: 0 10*3/uL (ref 0.0–0.5)
Eosinophils Relative: 0 %
HCT: 30.3 % — ABNORMAL LOW (ref 36.0–46.0)
Hemoglobin: 9.4 g/dL — ABNORMAL LOW (ref 12.0–15.0)
Immature Granulocytes: 2 %
Lymphocytes Relative: 9 %
Lymphs Abs: 0.5 10*3/uL — ABNORMAL LOW (ref 0.7–4.0)
MCH: 31.8 pg (ref 26.0–34.0)
MCHC: 31 g/dL (ref 30.0–36.0)
MCV: 102.4 fL — ABNORMAL HIGH (ref 80.0–100.0)
Monocytes Absolute: 0.2 10*3/uL (ref 0.1–1.0)
Monocytes Relative: 2 %
Neutro Abs: 5.3 10*3/uL (ref 1.7–7.7)
Neutrophils Relative %: 87 %
Platelet Count: 189 10*3/uL (ref 150–400)
RBC: 2.96 MIL/uL — ABNORMAL LOW (ref 3.87–5.11)
RDW: 16.5 % — ABNORMAL HIGH (ref 11.5–15.5)
WBC Count: 6.2 10*3/uL (ref 4.0–10.5)
nRBC: 0.7 % — ABNORMAL HIGH (ref 0.0–0.2)

## 2019-04-30 LAB — CMP (CANCER CENTER ONLY)
ALT: 20 U/L (ref 0–44)
AST: 26 U/L (ref 15–41)
Albumin: 4 g/dL (ref 3.5–5.0)
Alkaline Phosphatase: 118 U/L (ref 38–126)
Anion gap: 14 (ref 5–15)
BUN: 17 mg/dL (ref 8–23)
CO2: 23 mmol/L (ref 22–32)
Calcium: 9.1 mg/dL (ref 8.9–10.3)
Chloride: 104 mmol/L (ref 98–111)
Creatinine: 1.19 mg/dL — ABNORMAL HIGH (ref 0.44–1.00)
GFR, Est AFR Am: 56 mL/min — ABNORMAL LOW (ref >60)
GFR, Estimated: 48 mL/min — ABNORMAL LOW (ref >60)
Glucose, Bld: 281 mg/dL — ABNORMAL HIGH (ref 70–99)
Potassium: 4.1 mmol/L (ref 3.5–5.1)
Sodium: 141 mmol/L (ref 135–145)
Total Bilirubin: 0.8 mg/dL (ref 0.3–1.2)
Total Protein: 8.1 g/dL (ref 6.5–8.1)

## 2019-04-30 MED ORDER — SODIUM CHLORIDE 0.9 % IV SOLN
10.0000 mg | Freq: Once | INTRAVENOUS | Status: AC
  Administered 2019-04-30: 10 mg via INTRAVENOUS
  Filled 2019-04-30: qty 10

## 2019-04-30 MED ORDER — PALONOSETRON HCL INJECTION 0.25 MG/5ML
0.2500 mg | Freq: Once | INTRAVENOUS | Status: AC
  Administered 2019-04-30: 0.25 mg via INTRAVENOUS

## 2019-04-30 MED ORDER — FAMOTIDINE IN NACL 20-0.9 MG/50ML-% IV SOLN
20.0000 mg | Freq: Once | INTRAVENOUS | Status: AC
  Administered 2019-04-30: 20 mg via INTRAVENOUS

## 2019-04-30 MED ORDER — FAMOTIDINE IN NACL 20-0.9 MG/50ML-% IV SOLN
INTRAVENOUS | Status: AC
  Filled 2019-04-30: qty 50

## 2019-04-30 MED ORDER — SODIUM CHLORIDE 0.9 % IV SOLN
150.0000 mg | Freq: Once | INTRAVENOUS | Status: AC
  Administered 2019-04-30: 150 mg via INTRAVENOUS
  Filled 2019-04-30: qty 150

## 2019-04-30 MED ORDER — DIPHENHYDRAMINE HCL 50 MG/ML IJ SOLN
INTRAMUSCULAR | Status: AC
  Filled 2019-04-30: qty 1

## 2019-04-30 MED ORDER — SODIUM CHLORIDE 0.9 % IV SOLN
Freq: Once | INTRAVENOUS | Status: AC
  Filled 2019-04-30: qty 250

## 2019-04-30 MED ORDER — DIPHENHYDRAMINE HCL 50 MG/ML IJ SOLN
50.0000 mg | Freq: Once | INTRAMUSCULAR | Status: AC
  Administered 2019-04-30: 09:00:00 50 mg via INTRAVENOUS

## 2019-04-30 MED ORDER — PALONOSETRON HCL INJECTION 0.25 MG/5ML
INTRAVENOUS | Status: AC
  Filled 2019-04-30: qty 5

## 2019-04-30 MED ORDER — SODIUM CHLORIDE 0.9 % IV SOLN
140.0000 mg/m2 | Freq: Once | INTRAVENOUS | Status: AC
  Administered 2019-04-30: 306 mg via INTRAVENOUS
  Filled 2019-04-30: qty 51

## 2019-04-30 MED ORDER — SODIUM CHLORIDE 0.9 % IV SOLN
616.2000 mg | Freq: Once | INTRAVENOUS | Status: AC
  Administered 2019-04-30: 620 mg via INTRAVENOUS
  Filled 2019-04-30: qty 62

## 2019-04-30 NOTE — Assessment & Plan Note (Signed)
She has significant drug-induced hyperglycemia I will give her a shot of insulin today I advised her dietary modification while on treatment

## 2019-04-30 NOTE — Patient Instructions (Signed)
[facility] Discharge Instructions for Patients Receiving Chemotherapy  Today you received the following chemotherapy agents: paclitaxel (taxol) and carboplatin (paraplatin)  To help prevent nausea and vomiting after your treatment, we encourage you to take your nausea medication as prescribed.   If you develop nausea and vomiting that is not controlled by your nausea medication, call the clinic.   BELOW ARE SYMPTOMS THAT SHOULD BE REPORTED IMMEDIATELY:  *FEVER GREATER THAN 100.5 F  *CHILLS WITH OR WITHOUT FEVER  NAUSEA AND VOMITING THAT IS NOT CONTROLLED WITH YOUR NAUSEA MEDICATION  *UNUSUAL SHORTNESS OF BREATH  *UNUSUAL BRUISING OR BLEEDING  TENDERNESS IN MOUTH AND THROAT WITH OR WITHOUT PRESENCE OF ULCERS  *URINARY PROBLEMS  *BOWEL PROBLEMS  UNUSUAL RASH Items with * indicate a potential emergency and should be followed up as soon as possible.  Feel free to call the clinic should you have any questions or concerns. The clinic phone number is [phone].  Please show the [facility] at check-in to the Emergency Department and triage nurse.

## 2019-04-30 NOTE — Progress Notes (Signed)
[facility] OFFICE PROGRESS NOTE  Patient Care Team: Lennie Odor, [name] as PCP - General (Nurse Practitioner) Jettie Booze, MD as PCP - Cardiology (Cardiology) Kelton Pillar, MD as Attending Physician (Family Medicine) Jettie Booze, MD (Cardiology) Jettie Booze, MD as Attending Physician (Cardiology) [name], MD as Consulting Physician (Gynecologic Oncology)  ASSESSMENT & PLAN:  Endometrial cancer [facility]) So far, she tolerated treatment very well without major side effects, except for drug-induced hyperglycemia and intermittent fluctuation of her creatinine We will proceed with treatment as scheduled  Drug-induced hyperglycemia She has significant drug-induced hyperglycemia I will give her a shot of insulin today I advised her dietary modification while on treatment  CKD (chronic kidney disease) stage 3, GFR 30-59 ml/min Her renal function has improved I will adjust the dose of carboplatin accordingly   No orders of the defined types were placed in this encounter.   All questions were answered. The patient knows to call the clinic with any problems, questions or concerns. The total time spent in the appointment was 20 minutes encounter with patients including review of chart and various tests results, discussions about plan of care and coordination of care plan   Heath Lark, MD 04/30/2019 10:27 AM  INTERVAL HISTORY: Please see below for problem oriented charting. She returns for cycle 5 of chemotherapy She has been trying to watch her diet carefully while on treatment Denies recent side effects such as peripheral neuropathy, nausea or changes in bowel habits No recent infection, fever or chills The patient denies any recent signs or symptoms of bleeding such as spontaneous epistaxis, hematuria or hematochezia. No recent heart troubles  SUMMARY OF ONCOLOGIC HISTORY: Oncology History Overview Note  Clear cell features MSI  stable   Endometrial cancer ([place])  09/24/2018 Initial Diagnosis   She presented with post menopausal bleeding   11/20/2018 Initial Biopsy   EMB 11/10: G3 carcinoma with clear cell features.  Pap 11/10: AGC    12/03/2018 Surgery   TRH/BSO, bil SLNs   12/05/2018 Imaging   CT C/A/P:  1. Low attenuation mass or fluid in the endometrial cavity (series 2, image 110), in keeping with known endometrial malignancy. 2. Numerous small bilateral ground-glass pulmonary nodules. Although nonspecific these are likely infectious or inflammatory given appearance, isolated manifestation of metastatic disease much less favored. 3. Enlarged mediastinal and hilar lymph nodes, likely reactive given pulmonary findings. 4. No evidence of metastatic disease in the abdomen or pelvis. 5. Nonobstructive right nephrolithiasis. 6. Aortic Atherosclerosis (ICD10-I70.0).   01/02/2019 Pathologic Stage   Stage II Gr3 (clear cell features), no LVSI, cervial stroma involved, <50% MI MSI-low  A. 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            Tumor limited to upper half of myometrium.            Leiomyoma.            See oncology table.       Serosa: Fibrous adhesions with endosalpingiosis. No malignancy.  - Cervix: Stroma involved by tumor.  - Bilateral ovaries: Inclusion cysts. No malignancy.  - Bilateral fallopian tubes: Unremarkable. No malignancy.   B. LYMPH NODE, RIGHT OBTURATOR, SENTINEL, BIOPSY:  - One of one lymph nodes negative for carcinoma (0/1).   C. LYMPH NODE, LEFT EXTERNAL ILIAC, SENTINEL, BIOPSY:  - One of one lymph nodes  negative for carcinoma (0/1).   D. LYMPH NODE, LEFT EXTERNAL ILIAC, BIOPSY:  - One of one lymph nodes negative for carcinoma (0/1).   ONCOLOGY TABLE:   UTERUS, CARCINOMA OR CARCINOSARCOMA   Procedure: Total hysterectomy and  bilateral salpingo-oophorectomy  Histologic type: Poorly differentiated carcinoma with clear cell  features, see comment.  Histologic Grade: High-grade  Myometrial invasion:       Depth of invasion:9 mm       Myometrial thickness: 20 mm  Uterine Serosa Involvement: Not identified  Cervical stromal involvement: Present  Extent of involvement of other organs: Not identified  Lymphovascular invasion: Not identified  Regional Lymph Nodes:       Examined:     3 Sentinel                               0 non-sentinel                               3 total        Lymph nodes with metastasis: 0        Isolated tumor cells (<0.2 mm): 0        Micrometastasis:  (>0.2 mm and < 2.0 mm): 0        Macrometastasis: (>2.0 mm): 0        Extracapsular extension: Not applicable  Representative Tumor Block: A5  MMR / MSI testing: Will be ordered  Pathologic Stage Classification (pTNM, AJCC 8th edition):  pT2, pNX  Comments: The tumor consists of solid sheets and more tubulocystic areas with focal areas of cytoplasmic clearing. Immunohistochemistry is positive for racemase (weak), Napsin-A (focal), and p53 (wild type). ER and PR are negative. While extensive clear cell changes are not seen in the more solid components, the immunoprofile along with more definitive areas of clear cell changes favor the overall tumor to be a clear cell carcinoma. The tumor has a more pushing type of invasion. There is stromal invasion in areas of endocervical mucosa. Immunohistochemistry on the serosa adhesions reveals mesothelial cells (calretinin) and a small focus of endosalpingiosis (PAX-8, ER, MOC31). Pancytokeratin on the lymph nodes is negative.    01/18/2019 Cancer Staging   Staging form: Corpus Uteri - Carcinoma and Carcinosarcoma, AJCC 8th Edition - Pathologic: Stage II (pT2, pN0, cM0) - Signed by Heath Lark, MD on 01/18/2019   02/04/2019 -  Chemotherapy   The patient had palonosetron (ALOXI) injection 0.25 mg, 0.25 mg,  Intravenous,  Once, 5 of 6 cycles Administration: 0.25 mg (02/04/2019), 0.25 mg (02/25/2019), 0.25 mg (03/19/2019), 0.25 mg (04/09/2019) CARBOplatin (PARAPLATIN) 490 mg in sodium chloride 0.9 % 250 mL chemo infusion, 490 mg (65.5 % of original dose 750 mg), Intravenous,  Once, 5 of 6 cycles Dose modification: 750 mg (original dose 750 mg, Cycle 1, Reason: Provider Judgment), 491.4 mg (original dose 750 mg, Cycle 1, Reason: Change in SCr/CrCl),   (original dose 750 mg, Cycle 3, Reason: Provider Judgment) Administration: 490 mg (02/04/2019), 490 mg (02/25/2019), 650 mg (03/19/2019), 600 mg (04/09/2019) fosaprepitant (EMEND) 150 mg in sodium chloride 0.9 % 145 mL IVPB, 150 mg, Intravenous,  Once, 5 of 6 cycles Administration: 150 mg (02/04/2019), 150 mg (02/25/2019), 150 mg (03/19/2019), 150 mg (04/09/2019) PACLitaxel (TAXOL) 312 mg in sodium chloride 0.9 % 500 mL chemo infusion (> 41m/m2), 140 mg/m2 = 312 mg (80 % of original dose 175 mg/m2),  Intravenous,  Once, 5 of 6 cycles Dose modification: 140 mg/m2 (80 % of original dose 175 mg/m2, Cycle 1, Reason: Provider Judgment) Administration: 312 mg (02/04/2019), 300 mg (02/25/2019), 306 mg (03/19/2019), 306 mg (04/09/2019)  for chemotherapy treatment.      REVIEW OF SYSTEMS:   Constitutional: Denies fevers, chills or abnormal weight loss Eyes: Denies blurriness of vision Ears, nose, mouth, throat, and face: Denies mucositis or sore throat Respiratory: Denies cough, dyspnea or wheezes Cardiovascular: Denies palpitation, chest discomfort or lower extremity swelling Gastrointestinal:  Denies nausea, heartburn or change in bowel habits Skin: Denies abnormal skin rashes Lymphatics: Denies new lymphadenopathy or easy bruising Neurological:Denies numbness, tingling or new weaknesses Behavioral/Psych: Mood is stable, no new changes  All other systems were reviewed with the patient and are negative.  I have reviewed the past medical history, past surgical history, social  history and family history with the patient and they are unchanged from previous note.  ALLERGIES:  is allergic to chlorhexidine.  MEDICATIONS:  Current Outpatient Medications  Medication Sig Dispense Refill  . amiodarone (PACERONE) 200 MG tablet TAKE 1 TABLET(200 MG) BY MOUTH DAILY (Patient taking differently: Take 200 mg by mouth daily. ) 90 tablet 3  . amLODipine (NORVASC) 5 MG tablet Take 1 tablet (5 mg total) by mouth daily. 180 tablet 3  . atorvastatin (LIPITOR) 40 MG tablet Take 1 tablet (40 mg total) by mouth daily. 90 tablet 3  . calcium carbonate (OS-CAL - DOSED IN MG OF ELEMENTAL CALCIUM) 1250 MG tablet Take 1 tablet by mouth daily.      . carvedilol (COREG) 12.5 MG tablet Take 1 tablet (12.5 mg total) by mouth 2 (two) times daily with a meal. 180 tablet 3  . Cholecalciferol (VITAMIN D) 1000 UNITS capsule Take 1,000 Units by mouth daily.     . cyclobenzaprine (FLEXERIL) 10 MG tablet Take 10 mg by mouth daily.     [name] dexamethasone (DECADRON) 4 MG tablet Take 2 tabs at the night before chemotherapy, every 3 weeks, by mouth x 6 cycles, please dispense 12 tabs 60 tablet 0  . insulin glargine (LANTUS SOLOSTAR) 100 UNIT/ML injection Inject 30-35 Units into the skin See admin instructions. Inject 30 units into the skin in the morning and 35 units at bedtime.    . irbesartan (AVAPRO) 300 MG tablet Take 1 tablet (300 mg total) by mouth daily. 90 tablet 3  . metFORMIN (GLUCOPHAGE) 500 MG tablet Take 500 mg by mouth 2 (two) times daily with a meal.     . methocarbamol (ROBAXIN) 500 MG tablet Take 500 mg by mouth every 8 (eight) hours as needed for muscle spasms.     . ondansetron (ZOFRAN) 8 MG tablet Take 1 tablet (8 mg total) by mouth every 8 (eight) hours as needed. 30 tablet 1  . prochlorperazine (COMPAZINE) 10 MG tablet Take 1 tablet (10 mg total) by mouth every 6 (six) hours as needed (Nausea or vomiting). 30 tablet 1  . senna (SENOKOT) 8.6 MG TABS tablet Take 1 tablet (8.6 mg total) by  mouth daily as needed for up to 15 doses for mild constipation. 15 tablet 0  . warfarin (COUMADIN) 4 MG tablet Take 1 to 1.5 tablets daily as directed by [facility] Clinic 135 tablet 1   No current facility-administered medications for this visit.   Facility-Administered Medications Ordered in Other Visits  Medication Dose Route Frequency Provider Last Rate Last Admin  . CARBOplatin (PARAPLATIN) 620 mg in sodium chloride 0.9 % 250  mL chemo infusion  620 mg Intravenous Once Alvy Bimler, Awanda Wilcock, MD      . PACLitaxel (TAXOL) 306 mg in sodium chloride 0.9 % 500 mL chemo infusion (> 48m/m2)  140 mg/m2 (Treatment Plan Recorded) Intravenous Once GHeath Lark MD        PHYSICAL EXAMINATION: ECOG PERFORMANCE STATUS: 1 - Symptomatic but completely ambulatory  Vitals:   04/30/19 0851  BP: (!) 166/77  Pulse: 98  Resp: 18  Temp: 98.2 F (36.8 C)  SpO2: 96%   Filed Weights   04/30/19 0851  Weight: 233 lb 9.6 oz (106 kg)    GENERAL:alert, no distress and comfortable SKIN: skin color, texture, turgor are normal, no rashes or significant lesions EYES: normal, Conjunctiva are pink and non-injected, sclera clear OROPHARYNX:no exudate, no erythema and lips, buccal mucosa, and tongue normal  NECK: supple, thyroid normal size, non-tender, without nodularity LYMPH:  no palpable lymphadenopathy in the cervical, axillary or inguinal LUNGS: clear to auscultation and percussion with normal breathing effort HEART: regular rate & rhythm with ejection systolic click and no lower extremity edema ABDOMEN:abdomen soft, non-tender and normal bowel sounds Musculoskeletal:no cyanosis of digits and no clubbing  NEURO: alert & oriented x 3 with fluent speech, no focal motor/sensory deficits  LABORATORY DATA:  I have reviewed the data as listed    Component Value Date/Time   NA 141 04/30/2019 0834   NA 140 01/10/2019 0839   K 4.1 04/30/2019 0834   CL 104 04/30/2019 0834   CO2 23 04/30/2019 0834   GLUCOSE 281 (H)  04/30/2019 0834   BUN 17 04/30/2019 0834   BUN 12 01/10/2019 0839   CREATININE 1.19 (H) 04/30/2019 0834   CREATININE 0.67 05/28/2015 0958   CALCIUM 9.1 04/30/2019 0834   PROT 8.1 04/30/2019 0834   PROT 7.7 10/03/2016 0936   ALBUMIN 4.0 04/30/2019 0834   ALBUMIN 4.4 10/03/2016 0936   AST 26 04/30/2019 0834   ALT 20 04/30/2019 0834   ALKPHOS 118 04/30/2019 0834   BILITOT 0.8 04/30/2019 0834   GFRNONAA 48 (L) 04/30/2019 0834   GFRAA 56 (L) 04/30/2019 0834    No results found for: SPEP, UPEP  Lab Results  Component Value Date   WBC 6.2 04/30/2019   NEUTROABS 5.3 04/30/2019   HGB 9.4 (L) 04/30/2019   HCT 30.3 (L) 04/30/2019   MCV 102.4 (H) 04/30/2019   PLT 189 04/30/2019      Chemistry      Component Value Date/Time   NA 141 04/30/2019 0834   NA 140 01/10/2019 0839   K 4.1 04/30/2019 0834   CL 104 04/30/2019 0834   CO2 23 04/30/2019 0834   BUN 17 04/30/2019 0834   BUN 12 01/10/2019 0839   CREATININE 1.19 (H) 04/30/2019 0834   CREATININE 0.67 05/28/2015 0958      Component Value Date/Time   CALCIUM 9.1 04/30/2019 0834   ALKPHOS 118 04/30/2019 0834   AST 26 04/30/2019 0834   ALT 20 04/30/2019 0834   BILITOT 0.8 04/30/2019 0834

## 2019-04-30 NOTE — Assessment & Plan Note (Signed)
Her renal function has improved I will adjust the dose of carboplatin accordingly

## 2019-04-30 NOTE — Assessment & Plan Note (Signed)
So far, she tolerated treatment very well without major side effects, except for drug-induced hyperglycemia and intermittent fluctuation of her creatinine We will proceed with treatment as scheduled

## 2019-05-01 ENCOUNTER — Telehealth: Payer: Self-pay | Admitting: Hematology and Oncology

## 2019-05-01 NOTE — Telephone Encounter (Signed)
No new orders per 4/20 los. No changes made to pt's schedule.

## 2019-05-15 NOTE — Progress Notes (Signed)
[name] presents today for 1 month follow up after completing vaginal brachytherapy on 04/15/2019.   Pain 0 Fatigue0 N/V/D  0 Bladder issues 0 Vaginal or rectal bleeding? 0 Skin 0 Other notable issues, if any: She is still receiving paclitaxel and carboplatin infusions every 3 weeks. Next cycle scheduled for 05/21/2019 after visit with Dr. Alvy Bimler  Vitals:   05/16/19 0909  BP: (!) 189/82  Pulse: 92  Resp: 20  Temp: 97.8 F (36.6 C)  SpO2: 100%  Weight: 233 lb (105.7 kg)  Height: 5\' 6"  (1.676 m)      Wt Readings from Last 3 Encounters:  05/16/19 233 lb (105.7 kg)  04/30/19 233 lb 9.6 oz (106 kg)  04/09/19 229 lb 6.4 oz (104.1 kg)      Home Care Instructions for the Insertion and Care of Your Vaginal Dilator  Why Do I Need a Vaginal Dilator?  Internal radiation therapy may cause scar tissue to form at the top of your vagina (vaginal cuff).  This may make vaginal examinations difficult in the future. You can prevent scar tissue from forming by using a vaginal dilator (a smooth plastic rod), and/or by having regular sexual intercourse.  If not using the dilator you should be having intercourse two or three times a week.  If you are unable to have intercourse, you should use your vaginal dilator.  You may have some spotting or bleeding from your dilator or intercourse the first few times. You may also have some discomfort. If discomfort occurs with intercourse, you and your partner may need to stop for a while and try again later.  How to Use Your Vaginal Dilator  - Wash the dilator with soap and water before and after each use. - Check the dilator to be sure it is smooth. Do not use the dilator if you find any roughspots. - Coat the dilator with K-Y Jelly, Astroglide, or Replens. Do not use Vaseline, baby oil, or other oil based lubricants. They are not water-soluble and can be irritating to the tissues in the vagina. - Lie on your back with your knees bent and legs  apart. - Insert the rounded end of the dilator into your vagina as far as it will go without causing pain or discomfort. - Close your knees and slowly straighten your legs. - Keep the dilator in your vagina for about 10 to 15 minutes.  Please use 3 times a week, for example: Monday, Wednesday and Friday evenings. [facility] your knees, open your legs, and gently remove the dilator. - Gently cleanse the skin around the vaginal opening. - Wash the dilator after each use. -  It is important that you use the dilator routinely until instructed otherwise by your doctor.

## 2019-05-15 NOTE — Progress Notes (Signed)
Pharmacist Chemotherapy Monitoring - Follow Up Assessment    I verify that I have reviewed each item in the below checklist:  . Regimen for the patient is scheduled for the appropriate day and plan matches scheduled date. [name] Appropriate non-routine labs are ordered dependent on drug ordered. . If applicable, additional medications reviewed and ordered per protocol based on lifetime cumulative doses and/or treatment regimen.   Plan for follow-up and/or issues identified: No . I-vent associated with next due treatment: No . MD and/or nursing notified: No  Char Feltman D 05/15/2019 3:24 PM

## 2019-05-15 NOTE — Progress Notes (Signed)
Radiation Oncology         [phone] ________________________________  Name: [name] MRN: [id number]  Date: 05/16/2019  DOB: 1955/02/03  Follow-Up Visit Note  CC: [name], [name], PA  [name], [place], [name]    ICD-10-CM   1. Endometrial cancer ([facility])  C54.1     Diagnosis: StageIIendometrialcancer, high-grade  Interval Since Last Radiation: One month and one day.  Radiation Treatment Dates: 03/18/2019 through 04/15/2019 Site Technique Total Dose (Gy) Dose per Fx (Gy) Completed Fx Beam Energies  Vagina: Pelvis HDR-brachy 30/30 6 5/5 Ir-192    Narrative:  The patient returns today for routine follow-up. Since the end of treatment, she followed up with Dr. Alvy Bimler on 04/30/2019. At that time, the patient was noted to be tolerated treatment very well without major side effects with the exception of drug-induced hyperglycemia and intermittent fluctuation of her creatinine.                        On review of systems, she reports overall feeling well.  She continues to tolerate chemotherapy quite well. She denies vaginal bleeding or pelvic pain.  She has noticed some swelling in the thigh area likely related to her weight gain.  ALLERGIES:  is allergic to chlorhexidine.  Meds: Current Outpatient Medications  Medication Sig Dispense Refill  . amiodarone (PACERONE) 200 MG tablet TAKE 1 TABLET(200 MG) BY MOUTH DAILY (Patient taking differently: Take 200 mg by mouth daily. ) 90 tablet 3  . amLODipine (NORVASC) 5 MG tablet Take 1 tablet (5 mg total) by mouth daily. 180 tablet 3  . atorvastatin (LIPITOR) 40 MG tablet Take 1 tablet (40 mg total) by mouth daily. 90 tablet 3  . calcium carbonate (OS-CAL - DOSED IN MG OF ELEMENTAL CALCIUM) 1250 MG tablet Take 1 tablet by mouth daily.      . carvedilol (COREG) 12.5 MG tablet Take 1 tablet (12.5 mg total) by mouth 2 (two) times daily with a meal. 180 tablet 3  . Cholecalciferol (VITAMIN D) 1000 UNITS capsule Take 1,000 Units by mouth daily.       . cyclobenzaprine (FLEXERIL) 10 MG tablet Take 10 mg by mouth daily.     [name] dexamethasone (DECADRON) 4 MG tablet Take 2 tabs at the night before chemotherapy, every 3 weeks, by mouth x 6 cycles, please dispense 12 tabs 60 tablet 0  . insulin glargine (LANTUS SOLOSTAR) 100 UNIT/ML injection Inject 30-35 Units into the skin See admin instructions. Inject 30 units into the skin in the morning and 35 units at bedtime.    . irbesartan (AVAPRO) 300 MG tablet Take 1 tablet (300 mg total) by mouth daily. 90 tablet 3  . metFORMIN (GLUCOPHAGE) 500 MG tablet Take 500 mg by mouth 2 (two) times daily with a meal.     . methocarbamol (ROBAXIN) 500 MG tablet Take 500 mg by mouth every 8 (eight) hours as needed for muscle spasms.     . ondansetron (ZOFRAN) 8 MG tablet Take 1 tablet (8 mg total) by mouth every 8 (eight) hours as needed. 30 tablet 1  . prochlorperazine (COMPAZINE) 10 MG tablet Take 1 tablet (10 mg total) by mouth every 6 (six) hours as needed (Nausea or vomiting). 30 tablet 1  . senna (SENOKOT) 8.6 MG TABS tablet Take 1 tablet (8.6 mg total) by mouth daily as needed for up to 15 doses for mild constipation. 15 tablet 0  . warfarin (COUMADIN) 4 MG tablet Take 1 to  1.5 tablets daily as directed by [facility]madin Clinic 135 tablet 1   No current facility-administered medications for this encounter.    Physical Findings: The patient is in no acute distress. Patient is alert and oriented.  height is 5\' 6"  (1.676 m) and weight is 233 lb (105.7 kg). Her temperature is 97.8 F (36.6 C). Her blood pressure is 189/82 (abnormal) and her pulse is 92. Her respiration is 20 and oxygen saturation is 100%.  Lungs are clear to auscultation bilaterally. Heart has regular rate and rhythm. No palpable cervical, supraclavicular, or axillary adenopathy. Abdomen soft, non-tender, normal bowel sounds. Pelvic exam deferred in light of recent completion of treatment  Lab Findings: Lab Results  Component Value Date   WBC 6.2  04/30/2019   HGB 9.4 (L) 04/30/2019   HCT 30.3 (L) 04/30/2019   MCV 102.4 (H) 04/30/2019   PLT 189 04/30/2019    Radiographic Findings: No results found.  Impression: StageIIendometrialcancer, high-grade  The patient tolerated her vaginal brachytherapy well without any obvious side effects or toxicities.  Plan: The patient is scheduled to follow-up with Dr. Alvy Bimler on 05/21/2019. She will follow-up with radiation oncology in 5 months.  The patient will see Dr. Berline Lopes in approximately 2 months.  Patient was given a vaginal dilator and instructions on its use.  ____________________________________   Blair Promise, PhD, MD  This document serves as a record of services personally performed by Gery Pray, MD. It was created on his behalf by Clerance Lav, a trained medical scribe. The creation of this record is based on the scribe's personal observations and the provider's statements to them. This document has been checked and approved by the attending provider.

## 2019-05-15 NOTE — Progress Notes (Incomplete)
  Patient Name: [name] MRN: [id number] DOB: 04-02-1955 Referring Physician: Lennie Odor (Profile Not Attached) Date of Service: 04/15/2019 [facility] [place]                                                        End Of Treatment Note  Diagnoses: C54.1-Malignant neoplasm of endometrium  Cancer Staging: StageIIendometrialcancer, high-grade  Intent: Curative  Radiation Treatment Dates: 03/18/2019 through 04/15/2019 Site Technique Total Dose (Gy) Dose per Fx (Gy) Completed Fx Beam Energies  Vagina: Pelvis HDR-brachy 30/30 6 5/5 Ir-192   Narrative: The patient tolerated radiation therapy relatively well. She did not report any complaints throughout treatment.  Plan: The patient will continue with two more cycles of adjuvant chemotherapy and follow-up with radiation oncology in one month.  ________________________________________________   Blair Promise, PhD, MD  This document serves as a record of services personally performed by Gery Pray, MD. It was created on his behalf by Clerance Lav, a trained medical scribe. The creation of this record is based on the scribe's personal observations and the provider's statements to them. This document has been checked and approved by the attending provider.

## 2019-05-16 ENCOUNTER — Other Ambulatory Visit: Payer: Self-pay

## 2019-05-16 ENCOUNTER — Encounter: Payer: Self-pay | Admitting: Radiation Oncology

## 2019-05-16 ENCOUNTER — Ambulatory Visit
Admission: RE | Admit: 2019-05-16 | Discharge: 2019-05-16 | Disposition: A | Discharge status: Home / Self Care | Payer: Medicare Other | Source: Ambulatory Visit | Attending: Radiation Oncology | Admitting: Radiation Oncology

## 2019-05-16 VITALS — BP 189/82 | HR 92 | Temp 97.8°F | Resp 20 | Ht 66.0 in | Wt 233.0 lb

## 2019-05-16 DIAGNOSIS — Z79899 Other long term (current) drug therapy: Secondary | ICD-10-CM | POA: Insufficient documentation

## 2019-05-16 DIAGNOSIS — C541 Malignant neoplasm of endometrium: Secondary | ICD-10-CM

## 2019-05-16 DIAGNOSIS — Z923 Personal history of irradiation: Secondary | ICD-10-CM | POA: Insufficient documentation

## 2019-05-16 NOTE — Addendum Note (Signed)
Encounter addended by: Zola Button, RN on: 05/16/2019 10:42 AM  Actions taken: Charge Capture section accepted

## 2019-05-20 ENCOUNTER — Other Ambulatory Visit: Payer: Self-pay

## 2019-05-20 ENCOUNTER — Ambulatory Visit: Payer: Medicare Other | Admitting: *Deleted

## 2019-05-20 DIAGNOSIS — Z5181 Encounter for therapeutic drug level monitoring: Secondary | ICD-10-CM

## 2019-05-20 DIAGNOSIS — I059 Rheumatic mitral valve disease, unspecified: Secondary | ICD-10-CM | POA: Diagnosis not present

## 2019-05-20 LAB — POCT INR: INR: 3.1 — AB (ref 2.0–3.0)

## 2019-05-20 NOTE — Patient Instructions (Signed)
Description   Continue on same dosage 2 tablets daily except for 1.5 tablets on Mondays, Wednesdays, and Fridays. Recheck INR in 5 weeks. Call us with any medication changes or concerns to [facility] @ [phone].

## 2019-05-21 ENCOUNTER — Inpatient Hospital Stay: Payer: Medicare Other | Attending: Gynecologic Oncology

## 2019-05-21 ENCOUNTER — Other Ambulatory Visit: Payer: Self-pay

## 2019-05-21 ENCOUNTER — Other Ambulatory Visit: Payer: Self-pay | Admitting: Hematology and Oncology

## 2019-05-21 ENCOUNTER — Inpatient Hospital Stay: Payer: Medicare Other

## 2019-05-21 ENCOUNTER — Encounter: Payer: Self-pay | Admitting: Hematology and Oncology

## 2019-05-21 ENCOUNTER — Inpatient Hospital Stay (HOSPITAL_BASED_OUTPATIENT_CLINIC_OR_DEPARTMENT_OTHER): Payer: Medicare Other | Admitting: Hematology and Oncology

## 2019-05-21 ENCOUNTER — Telehealth: Payer: Self-pay | Admitting: Hematology and Oncology

## 2019-05-21 VITALS — BP 154/73 | HR 94 | Temp 98.5°F | Resp 18 | Ht 66.0 in | Wt 233.9 lb

## 2019-05-21 DIAGNOSIS — C541 Malignant neoplasm of endometrium: Secondary | ICD-10-CM | POA: Insufficient documentation

## 2019-05-21 DIAGNOSIS — N183 Chronic kidney disease, stage 3 unspecified: Secondary | ICD-10-CM

## 2019-05-21 DIAGNOSIS — D638 Anemia in other chronic diseases classified elsewhere: Secondary | ICD-10-CM | POA: Diagnosis not present

## 2019-05-21 DIAGNOSIS — T50905A Adverse effect of unspecified drugs, medicaments and biological substances, initial encounter: Secondary | ICD-10-CM | POA: Diagnosis not present

## 2019-05-21 DIAGNOSIS — E1165 Type 2 diabetes mellitus with hyperglycemia: Secondary | ICD-10-CM | POA: Diagnosis not present

## 2019-05-21 DIAGNOSIS — E1169 Type 2 diabetes mellitus with other specified complication: Secondary | ICD-10-CM | POA: Diagnosis not present

## 2019-05-21 DIAGNOSIS — Z5111 Encounter for antineoplastic chemotherapy: Secondary | ICD-10-CM | POA: Diagnosis present

## 2019-05-21 LAB — CBC WITH DIFFERENTIAL (CANCER CENTER ONLY)
Abs Immature Granulocytes: 0.2 10*3/uL — ABNORMAL HIGH (ref 0.00–0.07)
Basophils Absolute: 0 10*3/uL (ref 0.0–0.1)
Basophils Relative: 0 %
Eosinophils Absolute: 0 10*3/uL (ref 0.0–0.5)
Eosinophils Relative: 0 %
HCT: 29.3 % — ABNORMAL LOW (ref 36.0–46.0)
Hemoglobin: 8.9 g/dL — ABNORMAL LOW (ref 12.0–15.0)
Immature Granulocytes: 3 %
Lymphocytes Relative: 7 %
Lymphs Abs: 0.4 10*3/uL — ABNORMAL LOW (ref 0.7–4.0)
MCH: 31.1 pg (ref 26.0–34.0)
MCHC: 30.4 g/dL (ref 30.0–36.0)
MCV: 102.4 fL — ABNORMAL HIGH (ref 80.0–100.0)
Monocytes Absolute: 0.1 10*3/uL (ref 0.1–1.0)
Monocytes Relative: 2 %
Neutro Abs: 5.2 10*3/uL (ref 1.7–7.7)
Neutrophils Relative %: 88 %
Platelet Count: 168 10*3/uL (ref 150–400)
RBC: 2.86 MIL/uL — ABNORMAL LOW (ref 3.87–5.11)
RDW: 17.2 % — ABNORMAL HIGH (ref 11.5–15.5)
WBC Count: 5.9 10*3/uL (ref 4.0–10.5)
nRBC: 1.5 % — ABNORMAL HIGH (ref 0.0–0.2)

## 2019-05-21 LAB — CMP (CANCER CENTER ONLY)
ALT: 22 U/L (ref 0–44)
AST: 29 U/L (ref 15–41)
Albumin: 4 g/dL (ref 3.5–5.0)
Alkaline Phosphatase: 108 U/L (ref 38–126)
Anion gap: 13 (ref 5–15)
BUN: 14 mg/dL (ref 8–23)
CO2: 25 mmol/L (ref 22–32)
Calcium: 9 mg/dL (ref 8.9–10.3)
Chloride: 104 mmol/L (ref 98–111)
Creatinine: 1.18 mg/dL — ABNORMAL HIGH (ref 0.44–1.00)
GFR, Est AFR Am: 56 mL/min — ABNORMAL LOW (ref >60)
GFR, Estimated: 49 mL/min — ABNORMAL LOW (ref >60)
Glucose, Bld: 319 mg/dL — ABNORMAL HIGH (ref 70–99)
Potassium: 3.9 mmol/L (ref 3.5–5.1)
Sodium: 142 mmol/L (ref 135–145)
Total Bilirubin: 0.7 mg/dL (ref 0.3–1.2)
Total Protein: 7.6 g/dL (ref 6.5–8.1)

## 2019-05-21 MED ORDER — INSULIN REGULAR HUMAN 100 UNIT/ML IJ SOLN
10.0000 [IU] | Freq: Once | INTRAMUSCULAR | Status: AC
  Administered 2019-05-21: 11:00:00 10 [IU] via SUBCUTANEOUS

## 2019-05-21 MED ORDER — SODIUM CHLORIDE 0.9 % IV SOLN
Freq: Once | INTRAVENOUS | Status: AC
  Filled 2019-05-21: qty 250

## 2019-05-21 MED ORDER — SODIUM CHLORIDE 0.9 % IV SOLN
140.0000 mg/m2 | Freq: Once | INTRAVENOUS | Status: AC
  Administered 2019-05-21: 13:00:00 306 mg via INTRAVENOUS
  Filled 2019-05-21: qty 51

## 2019-05-21 MED ORDER — FAMOTIDINE IN NACL 20-0.9 MG/50ML-% IV SOLN
20.0000 mg | Freq: Once | INTRAVENOUS | Status: AC
  Administered 2019-05-21: 11:00:00 20 mg via INTRAVENOUS

## 2019-05-21 MED ORDER — DIPHENHYDRAMINE HCL 50 MG/ML IJ SOLN
50.0000 mg | Freq: Once | INTRAMUSCULAR | Status: AC
  Administered 2019-05-21: 50 mg via INTRAVENOUS

## 2019-05-21 MED ORDER — SODIUM CHLORIDE 0.9 % IV SOLN
620.0000 mg | Freq: Once | INTRAVENOUS | Status: AC
  Administered 2019-05-21: 620 mg via INTRAVENOUS
  Filled 2019-05-21: qty 62

## 2019-05-21 MED ORDER — SODIUM CHLORIDE 0.9 % IV SOLN
10.0000 mg | Freq: Once | INTRAVENOUS | Status: AC
  Administered 2019-05-21: 10 mg via INTRAVENOUS
  Filled 2019-05-21: qty 10

## 2019-05-21 MED ORDER — PALONOSETRON HCL INJECTION 0.25 MG/5ML
0.2500 mg | Freq: Once | INTRAVENOUS | Status: AC
  Administered 2019-05-21: 0.25 mg via INTRAVENOUS

## 2019-05-21 MED ORDER — INSULIN REGULAR HUMAN 100 UNIT/ML IJ SOLN
INTRAMUSCULAR | Status: AC
  Filled 2019-05-21: qty 1

## 2019-05-21 MED ORDER — SODIUM CHLORIDE 0.9 % IV SOLN
150.0000 mg | Freq: Once | INTRAVENOUS | Status: AC
  Administered 2019-05-21: 12:00:00 150 mg via INTRAVENOUS
  Filled 2019-05-21: qty 150

## 2019-05-21 MED ORDER — DIPHENHYDRAMINE HCL 50 MG/ML IJ SOLN
INTRAMUSCULAR | Status: AC
  Filled 2019-05-21: qty 1

## 2019-05-21 MED ORDER — PALONOSETRON HCL INJECTION 0.25 MG/5ML
INTRAVENOUS | Status: AC
  Filled 2019-05-21: qty 5

## 2019-05-21 MED ORDER — FAMOTIDINE IN NACL 20-0.9 MG/50ML-% IV SOLN
INTRAVENOUS | Status: AC
  Filled 2019-05-21: qty 50

## 2019-05-21 NOTE — Telephone Encounter (Signed)
Scheduled per 5/11 sch msg. Messaged RN victoria to print out calender for pt.

## 2019-05-21 NOTE — Assessment & Plan Note (Signed)
She has significant drug-induced hyperglycemia I will give her a shot of insulin today I advised her dietary modification while on treatment

## 2019-05-21 NOTE — Assessment & Plan Note (Signed)
So far, she tolerated treatment very well without major side effects, except for drug-induced hyperglycemia and intermittent fluctuation of her creatinine We will proceed with treatment as scheduled

## 2019-05-21 NOTE — Assessment & Plan Note (Signed)
Her renal function is stable I will adjust the dose of carboplatin accordingly

## 2019-05-21 NOTE — Patient Instructions (Signed)
[facility] Discharge Instructions for Patients Receiving Chemotherapy  Today you received the following chemotherapy agents Paclitaxel (TAXOL) & Carboplatin (PARAPLATIN).  To help prevent nausea and vomiting after your treatment, we encourage you to take your nausea medication as prescribed.  If you develop nausea and vomiting that is not controlled by your nausea medication, call the clinic.   BELOW ARE SYMPTOMS THAT SHOULD BE REPORTED IMMEDIATELY:  *FEVER GREATER THAN 100.5 F  *CHILLS WITH OR WITHOUT FEVER  NAUSEA AND VOMITING THAT IS NOT CONTROLLED WITH YOUR NAUSEA MEDICATION  *UNUSUAL SHORTNESS OF BREATH  *UNUSUAL BRUISING OR BLEEDING  TENDERNESS IN MOUTH AND THROAT WITH OR WITHOUT PRESENCE OF ULCERS  *URINARY PROBLEMS  *BOWEL PROBLEMS  UNUSUAL RASH Items with * indicate a potential emergency and should be followed up as soon as possible.  Feel free to call the clinic should you have any questions or concerns. The clinic phone number is [phone].  Please show the CHEMO ALERT CARD at check-in to the Emergency Department and triage nurse.   

## 2019-05-21 NOTE — Progress Notes (Signed)
[place] OFFICE PROGRESS NOTE  Patient Care Team: Lennie Odor, [name] as PCP - General (Nurse Practitioner) Jettie Booze, MD as PCP - Cardiology (Cardiology) Kelton Pillar, MD as Attending Physician (Family Medicine) Jettie Booze, MD (Cardiology) Jettie Booze, MD as Attending Physician (Cardiology) [name], MD as Consulting Physician (Gynecologic Oncology)  ASSESSMENT & PLAN:  Endometrial cancer [facility]) So far, she tolerated treatment very well without major side effects, except for drug-induced hyperglycemia and intermittent fluctuation of her creatinine We will proceed with treatment as scheduled  Diabetes mellitus ([place]) She has significant drug-induced hyperglycemia I will give her a shot of insulin today I advised her dietary modification while on treatment  CKD (chronic kidney disease) stage 3, GFR 30-59 ml/min Her renal function is stable I will adjust the dose of carboplatin accordingly  Anemia, chronic disease The cause of the anemia is multifactorial We will continue to observe Is likely related to chemo and her chronic kidney disease She is not symptomatic   Orders Placed This Encounter  Procedures  . CT CHEST W CONTRAST    Standing Status:   Future    Standing Expiration Date:   05/20/2020    Order Specific Question:   If indicated for the ordered procedure, I authorize the administration of contrast media per Radiology protocol    Answer:   Yes    Order Specific Question:   Preferred imaging location?    Answer:   [facility]    Order Specific Question:   Radiology Contrast Protocol - do NOT remove file path    Answer:   \\[email or web address]  . CT ABDOMEN PELVIS W CONTRAST    Standing Status:   Future    Standing Expiration Date:   05/20/2020    Order Specific Question:   If indicated for the ordered procedure, I authorize the administration of contrast media per Radiology  protocol    Answer:   Yes    Order Specific Question:   Preferred imaging location?    Answer:   [facility]    Order Specific Question:   Radiology Contrast Protocol - do NOT remove file path    Answer:   \\[email or web address]    All questions were answered. The patient knows to call the clinic with any problems, questions or concerns. The total time spent in the appointment was 20 minutes encounter with patients including review of chart and various tests results, discussions about plan of care and coordination of care plan   Heath Lark, MD 05/21/2019 4:06 PM  INTERVAL HISTORY: Please see below for problem oriented charting. She returns with her daughter for further follow-up and final chemotherapy She is doing well No recent significant side effects such as nausea or constipation No peripheral neuropathy from treatment  SUMMARY OF ONCOLOGIC HISTORY: Oncology History Overview Note  Clear cell features MSI stable   Endometrial cancer ([place])  09/24/2018 Initial Diagnosis   She presented with post menopausal bleeding   11/20/2018 Initial Biopsy   EMB 11/10: G3 carcinoma with clear cell features.  Pap 11/10: AGC    12/03/2018 Surgery   TRH/BSO, bil SLNs   12/05/2018 Imaging   CT C/A/P:  1. Low attenuation mass or fluid in the endometrial cavity (series 2, image 110), in keeping with known endometrial malignancy. 2. Numerous small bilateral ground-glass pulmonary nodules. Although nonspecific these are likely infectious or inflammatory given appearance, isolated manifestation of metastatic disease much less favored. 3. Enlarged mediastinal and hilar lymph  nodes, likely reactive given pulmonary findings. 4. No evidence of metastatic disease in the abdomen or pelvis. 5. Nonobstructive right nephrolithiasis. 6. Aortic Atherosclerosis (ICD10-I70.0).   01/02/2019 Pathologic Stage   Stage II Gr3 (clear cell features), no LVSI, cervial stroma  involved, <50% MI MSI-low  A. 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            Tumor limited to upper half of myometrium.            Leiomyoma.            See oncology table.       Serosa: Fibrous adhesions with endosalpingiosis. No malignancy.  - Cervix: Stroma involved by tumor.  - Bilateral ovaries: Inclusion cysts. No malignancy.  - Bilateral fallopian tubes: Unremarkable. No malignancy.   B. LYMPH NODE, RIGHT OBTURATOR, SENTINEL, BIOPSY:  - One of one lymph nodes negative for carcinoma (0/1).   C. LYMPH NODE, LEFT EXTERNAL ILIAC, SENTINEL, BIOPSY:  - One of one lymph nodes negative for carcinoma (0/1).   D. LYMPH NODE, LEFT EXTERNAL ILIAC, BIOPSY:  - One of one lymph nodes negative for carcinoma (0/1).   ONCOLOGY TABLE:   UTERUS, CARCINOMA OR CARCINOSARCOMA   Procedure: Total hysterectomy and bilateral salpingo-oophorectomy  Histologic type: Poorly differentiated carcinoma with clear cell  features, see comment.  Histologic Grade: High-grade  Myometrial invasion:       Depth of invasion:9 mm       Myometrial thickness: 20 mm  Uterine Serosa Involvement: Not identified  Cervical stromal involvement: Present  Extent of involvement of other organs: Not identified  Lymphovascular invasion: Not identified  Regional Lymph Nodes:       Examined:     3 Sentinel                               0 non-sentinel                               3 total        Lymph nodes with metastasis: 0        Isolated tumor cells (<0.2 mm): 0        Micrometastasis:  (>0.2 mm and < 2.0 mm): 0        Macrometastasis: (>2.0 mm): 0        Extracapsular extension: Not applicable  Representative Tumor Block: A5  MMR / MSI testing: Will be ordered  Pathologic Stage Classification (pTNM, AJCC 8th edition):  pT2, pNX  Comments: The tumor consists of solid  sheets and more tubulocystic areas with focal areas of cytoplasmic clearing. Immunohistochemistry is positive for racemase (weak), Napsin-A (focal), and p53 (wild type). ER and PR are negative. While extensive clear cell changes are not seen in the more solid components, the immunoprofile along with more definitive areas of clear cell changes favor the overall tumor to be a clear cell carcinoma. The tumor has a more pushing type of invasion. There is stromal invasion in areas of endocervical mucosa. Immunohistochemistry on the serosa adhesions reveals mesothelial cells (calretinin) and a small focus of endosalpingiosis (PAX-8, ER, MOC31). Pancytokeratin on the lymph nodes is negative.    01/18/2019 Cancer Staging   Staging form: Corpus Uteri - Carcinoma and Carcinosarcoma, AJCC  8th Edition - Pathologic: Stage II (pT2, pN0, cM0) - Signed by Heath Lark, MD on 01/18/2019   02/04/2019 -  Chemotherapy   The patient had palonosetron (ALOXI) injection 0.25 mg, 0.25 mg, Intravenous,  Once, 6 of 6 cycles Administration: 0.25 mg (02/04/2019), 0.25 mg (02/25/2019), 0.25 mg (03/19/2019), 0.25 mg (04/09/2019), 0.25 mg (04/30/2019) CARBOplatin (PARAPLATIN) 490 mg in sodium chloride 0.9 % 250 mL chemo infusion, 490 mg (65.5 % of original dose 750 mg), Intravenous,  Once, 6 of 6 cycles Dose modification: 750 mg (original dose 750 mg, Cycle 1, Reason: Provider Judgment), 491.4 mg (original dose 750 mg, Cycle 1, Reason: Change in SCr/CrCl),   (original dose 750 mg, Cycle 3, Reason: Provider Judgment) Administration: 490 mg (02/04/2019), 490 mg (02/25/2019), 650 mg (03/19/2019), 600 mg (04/09/2019), 620 mg (04/30/2019) fosaprepitant (EMEND) 150 mg in sodium chloride 0.9 % 145 mL IVPB, 150 mg, Intravenous,  Once, 6 of 6 cycles Administration: 150 mg (02/04/2019), 150 mg (02/25/2019), 150 mg (03/19/2019), 150 mg (04/09/2019), 150 mg (04/30/2019) PACLitaxel (TAXOL) 312 mg in sodium chloride 0.9 % 500 mL chemo infusion (> 16m/m2), 140 mg/m2 =  312 mg (80 % of original dose 175 mg/m2), Intravenous,  Once, 6 of 6 cycles Dose modification: 140 mg/m2 (80 % of original dose 175 mg/m2, Cycle 1, Reason: Provider Judgment) Administration: 312 mg (02/04/2019), 300 mg (02/25/2019), 306 mg (03/19/2019), 306 mg (04/09/2019), 306 mg (04/30/2019)  for chemotherapy treatment.      REVIEW OF SYSTEMS:   Constitutional: Denies fevers, chills or abnormal weight loss Eyes: Denies blurriness of vision Ears, nose, mouth, throat, and face: Denies mucositis or sore throat Respiratory: Denies cough, dyspnea or wheezes Cardiovascular: Denies palpitation, chest discomfort or lower extremity swelling Gastrointestinal:  Denies nausea, heartburn or change in bowel habits Skin: Denies abnormal skin rashes Lymphatics: Denies new lymphadenopathy or easy bruising Neurological:Denies numbness, tingling or new weaknesses Behavioral/Psych: Mood is stable, no new changes  All other systems were reviewed with the patient and are negative.  I have reviewed the past medical history, past surgical history, social history and family history with the patient and they are unchanged from previous note.  ALLERGIES:  is allergic to chlorhexidine.  MEDICATIONS:  Current Outpatient Medications  Medication Sig Dispense Refill  . amiodarone (PACERONE) 200 MG tablet TAKE 1 TABLET(200 MG) BY MOUTH DAILY (Patient taking differently: Take 200 mg by mouth daily. ) 90 tablet 3  . amLODipine (NORVASC) 5 MG tablet Take 1 tablet (5 mg total) by mouth daily. 180 tablet 3  . atorvastatin (LIPITOR) 40 MG tablet Take 1 tablet (40 mg total) by mouth daily. 90 tablet 3  . calcium carbonate (OS-CAL - DOSED IN MG OF ELEMENTAL CALCIUM) 1250 MG tablet Take 1 tablet by mouth daily.      . carvedilol (COREG) 12.5 MG tablet Take 1 tablet (12.5 mg total) by mouth 2 (two) times daily with a meal. 180 tablet 3  . Cholecalciferol (VITAMIN D) 1000 UNITS capsule Take 1,000 Units by mouth daily.     .  cyclobenzaprine (FLEXERIL) 10 MG tablet Take 10 mg by mouth daily.     .Marland Kitchendexamethasone (DECADRON) 4 MG tablet Take 2 tabs at the night before chemotherapy, every 3 weeks, by mouth x 6 cycles, please dispense 12 tabs 60 tablet 0  . insulin glargine (LANTUS SOLOSTAR) 100 UNIT/ML injection Inject 30-35 Units into the skin See admin instructions. Inject 30 units into the skin in the morning and 35 units at bedtime.    .[name]  irbesartan (AVAPRO) 300 MG tablet Take 1 tablet (300 mg total) by mouth daily. 90 tablet 3  . metFORMIN (GLUCOPHAGE) 500 MG tablet Take 500 mg by mouth 2 (two) times daily with a meal.     . methocarbamol (ROBAXIN) 500 MG tablet Take 500 mg by mouth every 8 (eight) hours as needed for muscle spasms.     . ondansetron (ZOFRAN) 8 MG tablet Take 1 tablet (8 mg total) by mouth every 8 (eight) hours as needed. 30 tablet 1  . prochlorperazine (COMPAZINE) 10 MG tablet Take 1 tablet (10 mg total) by mouth every 6 (six) hours as needed (Nausea or vomiting). 30 tablet 1  . senna (SENOKOT) 8.6 MG TABS tablet Take 1 tablet (8.6 mg total) by mouth daily as needed for up to 15 doses for mild constipation. 15 tablet 0  . warfarin (COUMADIN) 4 MG tablet Take 1 to 1.5 tablets daily as directed by [facility] 135 tablet 1   No current facility-administered medications for this visit.   Facility-Administered Medications Ordered in Other Visits  Medication Dose Route Frequency Provider Last Rate Last Admin  . CARBOplatin (PARAPLATIN) 620 mg in sodium chloride 0.9 % 250 mL chemo infusion  620 mg Intravenous Once Heath Lark, MD 624 mL/hr at 05/21/19 1557 620 mg at 05/21/19 1557    PHYSICAL EXAMINATION: ECOG PERFORMANCE STATUS: 1 - Symptomatic but completely ambulatory  Vitals:   05/21/19 0952  BP: (!) 154/73  Pulse: 94  Resp: 18  Temp: 98.5 F (36.9 C)  SpO2: 96%   Filed Weights   05/21/19 0952  Weight: 233 lb 14.4 oz (106.1 kg)    GENERAL:alert, no distress and comfortable SKIN: skin  color, texture, turgor are normal, no rashes or significant lesions EYES: normal, Conjunctiva are pink and non-injected, sclera clear OROPHARYNX:no exudate, no erythema and lips, buccal mucosa, and tongue normal  NECK: supple, thyroid normal size, non-tender, without nodularity LYMPH:  no palpable lymphadenopathy in the cervical, axillary or inguinal LUNGS: clear to auscultation and percussion with normal breathing effort HEART: regular rate & rhythm with soft left heart murmurs and no lower extremity edema ABDOMEN:abdomen soft, non-tender and normal bowel sounds Musculoskeletal:no cyanosis of digits and no clubbing  NEURO: alert & oriented x 3 with fluent speech, no focal motor/sensory deficits  LABORATORY DATA:  I have reviewed the data as listed    Component Value Date/Time   NA 142 05/21/2019 0915   NA 140 01/10/2019 0839   K 3.9 05/21/2019 0915   CL 104 05/21/2019 0915   CO2 25 05/21/2019 0915   GLUCOSE 319 (H) 05/21/2019 0915   BUN 14 05/21/2019 0915   BUN 12 01/10/2019 0839   CREATININE 1.18 (H) 05/21/2019 0915   CREATININE 0.67 05/28/2015 0958   CALCIUM 9.0 05/21/2019 0915   PROT 7.6 05/21/2019 0915   PROT 7.7 10/03/2016 0936   ALBUMIN 4.0 05/21/2019 0915   ALBUMIN 4.4 10/03/2016 0936   AST 29 05/21/2019 0915   ALT 22 05/21/2019 0915   ALKPHOS 108 05/21/2019 0915   BILITOT 0.7 05/21/2019 0915   GFRNONAA 49 (L) 05/21/2019 0915   GFRAA 56 (L) 05/21/2019 0915    No results found for: SPEP, UPEP  Lab Results  Component Value Date   WBC 5.9 05/21/2019   NEUTROABS 5.2 05/21/2019   HGB 8.9 (L) 05/21/2019   HCT 29.3 (L) 05/21/2019   MCV 102.4 (H) 05/21/2019   PLT 168 05/21/2019      Chemistry  Component Value Date/Time   NA 142 05/21/2019 0915   NA 140 01/10/2019 0839   K 3.9 05/21/2019 0915   CL 104 05/21/2019 0915   CO2 25 05/21/2019 0915   BUN 14 05/21/2019 0915   BUN 12 01/10/2019 0839   CREATININE 1.18 (H) 05/21/2019 0915   CREATININE 0.67  05/28/2015 0958      Component Value Date/Time   CALCIUM 9.0 05/21/2019 0915   ALKPHOS 108 05/21/2019 0915   AST 29 05/21/2019 0915   ALT 22 05/21/2019 0915   BILITOT 0.7 05/21/2019 0915

## 2019-05-21 NOTE — Assessment & Plan Note (Signed)
The cause of the anemia is multifactorial We will continue to observe Is likely related to chemo and her chronic kidney disease She is not symptomatic

## 2019-06-08 ENCOUNTER — Other Ambulatory Visit: Payer: Self-pay | Admitting: Interventional Cardiology

## 2019-06-11 ENCOUNTER — Other Ambulatory Visit: Payer: Self-pay | Admitting: Pharmacist

## 2019-06-11 MED ORDER — WARFARIN SODIUM 4 MG PO TABS: ORAL_TABLET | ORAL | 1 refills | Status: DC

## 2019-06-20 ENCOUNTER — Inpatient Hospital Stay: Payer: Medicare Other | Attending: Gynecologic Oncology

## 2019-06-20 ENCOUNTER — Ambulatory Visit (HOSPITAL_COMMUNITY)
Admission: RE | Admit: 2019-06-20 | Discharge: 2019-06-20 | Disposition: A | Discharge status: Home / Self Care | Payer: Medicare Other | Source: Ambulatory Visit | Attending: Hematology and Oncology | Admitting: Hematology and Oncology

## 2019-06-20 ENCOUNTER — Other Ambulatory Visit: Payer: Self-pay

## 2019-06-20 ENCOUNTER — Encounter (HOSPITAL_COMMUNITY): Payer: Self-pay

## 2019-06-20 DIAGNOSIS — Z79899 Other long term (current) drug therapy: Secondary | ICD-10-CM | POA: Diagnosis not present

## 2019-06-20 DIAGNOSIS — Z90722 Acquired absence of ovaries, bilateral: Secondary | ICD-10-CM | POA: Diagnosis not present

## 2019-06-20 DIAGNOSIS — Z7952 Long term (current) use of systemic steroids: Secondary | ICD-10-CM | POA: Diagnosis not present

## 2019-06-20 DIAGNOSIS — D61818 Other pancytopenia: Secondary | ICD-10-CM | POA: Diagnosis not present

## 2019-06-20 DIAGNOSIS — Z9221 Personal history of antineoplastic chemotherapy: Secondary | ICD-10-CM | POA: Diagnosis not present

## 2019-06-20 DIAGNOSIS — C641 Malignant neoplasm of right kidney, except renal pelvis: Secondary | ICD-10-CM | POA: Diagnosis not present

## 2019-06-20 DIAGNOSIS — Z9079 Acquired absence of other genital organ(s): Secondary | ICD-10-CM | POA: Diagnosis not present

## 2019-06-20 DIAGNOSIS — C541 Malignant neoplasm of endometrium: Secondary | ICD-10-CM

## 2019-06-20 DIAGNOSIS — Z08 Encounter for follow-up examination after completed treatment for malignant neoplasm: Secondary | ICD-10-CM | POA: Insufficient documentation

## 2019-06-20 DIAGNOSIS — Z7901 Long term (current) use of anticoagulants: Secondary | ICD-10-CM | POA: Insufficient documentation

## 2019-06-20 DIAGNOSIS — G620 Drug-induced polyneuropathy: Secondary | ICD-10-CM | POA: Diagnosis not present

## 2019-06-20 DIAGNOSIS — Z90710 Acquired absence of both cervix and uterus: Secondary | ICD-10-CM | POA: Diagnosis not present

## 2019-06-20 LAB — CBC WITH DIFFERENTIAL (CANCER CENTER ONLY)
Abs Immature Granulocytes: 0.01 10*3/uL (ref 0.00–0.07)
Basophils Absolute: 0 10*3/uL (ref 0.0–0.1)
Basophils Relative: 1 %
Eosinophils Absolute: 0.1 10*3/uL (ref 0.0–0.5)
Eosinophils Relative: 3 %
HCT: 27.6 % — ABNORMAL LOW (ref 36.0–46.0)
Hemoglobin: 8.6 g/dL — ABNORMAL LOW (ref 12.0–15.0)
Immature Granulocytes: 0 %
Lymphocytes Relative: 21 %
Lymphs Abs: 0.8 10*3/uL (ref 0.7–4.0)
MCH: 31.7 pg (ref 26.0–34.0)
MCHC: 31.2 g/dL (ref 30.0–36.0)
MCV: 101.8 fL — ABNORMAL HIGH (ref 80.0–100.0)
Monocytes Absolute: 0.5 10*3/uL (ref 0.1–1.0)
Monocytes Relative: 12 %
Neutro Abs: 2.5 10*3/uL (ref 1.7–7.7)
Neutrophils Relative %: 63 %
Platelet Count: 134 10*3/uL — ABNORMAL LOW (ref 150–400)
RBC: 2.71 MIL/uL — ABNORMAL LOW (ref 3.87–5.11)
RDW: 17.4 % — ABNORMAL HIGH (ref 11.5–15.5)
WBC Count: 3.9 10*3/uL — ABNORMAL LOW (ref 4.0–10.5)
nRBC: 0 % (ref 0.0–0.2)

## 2019-06-20 LAB — CMP (CANCER CENTER ONLY)
ALT: 13 U/L (ref 0–44)
AST: 20 U/L (ref 15–41)
Albumin: 3.7 g/dL (ref 3.5–5.0)
Alkaline Phosphatase: 93 U/L (ref 38–126)
Anion gap: 11 (ref 5–15)
BUN: 19 mg/dL (ref 8–23)
CO2: 27 mmol/L (ref 22–32)
Calcium: 9.1 mg/dL (ref 8.9–10.3)
Chloride: 106 mmol/L (ref 98–111)
Creatinine: 1.02 mg/dL — ABNORMAL HIGH (ref 0.44–1.00)
GFR, Est AFR Am: >60 mL/min (ref >60)
GFR, Estimated: 58 mL/min — ABNORMAL LOW (ref >60)
Glucose, Bld: 98 mg/dL (ref 70–99)
Potassium: 3.5 mmol/L (ref 3.5–5.1)
Sodium: 144 mmol/L (ref 135–145)
Total Bilirubin: 0.5 mg/dL (ref 0.3–1.2)
Total Protein: 7.3 g/dL (ref 6.5–8.1)

## 2019-06-20 MED ORDER — SODIUM CHLORIDE (PF) 0.9 % IJ SOLN
INTRAMUSCULAR | Status: AC
  Filled 2019-06-20: qty 50

## 2019-06-20 MED ORDER — IOHEXOL 300 MG/ML  SOLN
100.0000 mL | Freq: Once | INTRAMUSCULAR | Status: AC | PRN
  Administered 2019-06-20: 100 mL via INTRAVENOUS

## 2019-06-21 ENCOUNTER — Encounter: Payer: Self-pay | Admitting: Hematology and Oncology

## 2019-06-21 ENCOUNTER — Inpatient Hospital Stay (HOSPITAL_BASED_OUTPATIENT_CLINIC_OR_DEPARTMENT_OTHER): Payer: Medicare Other | Admitting: Hematology and Oncology

## 2019-06-21 ENCOUNTER — Other Ambulatory Visit: Payer: Self-pay

## 2019-06-21 DIAGNOSIS — D61818 Other pancytopenia: Secondary | ICD-10-CM | POA: Insufficient documentation

## 2019-06-21 DIAGNOSIS — G620 Drug-induced polyneuropathy: Secondary | ICD-10-CM

## 2019-06-21 DIAGNOSIS — I5033 Acute on chronic diastolic (congestive) heart failure: Secondary | ICD-10-CM | POA: Diagnosis not present

## 2019-06-21 DIAGNOSIS — C641 Malignant neoplasm of right kidney, except renal pelvis: Secondary | ICD-10-CM | POA: Diagnosis not present

## 2019-06-21 DIAGNOSIS — T451X5A Adverse effect of antineoplastic and immunosuppressive drugs, initial encounter: Secondary | ICD-10-CM | POA: Insufficient documentation

## 2019-06-21 DIAGNOSIS — C541 Malignant neoplasm of endometrium: Secondary | ICD-10-CM

## 2019-06-21 NOTE — Assessment & Plan Note (Signed)
She is mildly fatigued with recent pancytopenia She does not need transfusion support With discontinuation of chemotherapy, I anticipate her blood work will return back to normal within the next 2 to 3 months She has appointment to see her primary care doctor in September for further follow-up and I recommend repeat blood work at that time

## 2019-06-21 NOTE — Assessment & Plan Note (Signed)
She has mild shortness of breath and fluid retention CT scan of the chest confirmed evidence to suggest possible volume overload I recommend she contact her cardiologist for medication adjustment and diuretic therapy

## 2019-06-21 NOTE — Assessment & Plan Note (Signed)
She has completed adjuvant treatment I reviewed CT imaging with her There are no signs of disease She will continue close follow-up with GYN surgeon and radiation oncologist in the future

## 2019-06-21 NOTE — Progress Notes (Signed)
[place] OFFICE PROGRESS NOTE  Patient Care Team: Lennie Odor, [name] as PCP - General (Nurse Practitioner) Jettie Booze, MD as PCP - Cardiology (Cardiology) Kelton Pillar, MD as Attending Physician (Family Medicine) Jettie Booze, MD (Cardiology) Jettie Booze, MD as Attending Physician (Cardiology) [name], MD as Consulting Physician (Gynecologic Oncology)  ASSESSMENT & PLAN:  Endometrial cancer [facility]) She has completed adjuvant treatment I reviewed CT imaging with her There are no signs of disease She will continue close follow-up with GYN surgeon and radiation oncologist in the future   Pancytopenia, acquired [facility]) She is mildly fatigued with recent pancytopenia She does not need transfusion support With discontinuation of chemotherapy, I anticipate her blood work will return back to normal within the next 2 to 3 months She has appointment to see her primary care doctor in September for further follow-up and I recommend repeat blood work at that time  Acute on chronic diastolic CHF (congestive heart failure) ([place]) She has mild shortness of breath and fluid retention CT scan of the chest confirmed evidence to suggest possible volume overload I recommend she contact her cardiologist for medication adjustment and diuretic therapy  Peripheral neuropathy due to chemotherapy [facility]) She has mild peripheral neuropathy from treatment I anticipate it will improve in the future We discussed the risk and benefits of referral to physical therapy and rehab for therapy but she would like to see how she feels and will call me if she is interested   No orders of the defined types were placed in this encounter.   All questions were answered. The patient knows to call the clinic with any problems, questions or concerns. The total time spent in the appointment was 25 minutes encounter with patients including review of chart and various tests  results, discussions about plan of care and coordination of care plan   Heath Lark, MD 06/21/2019 3:15 PM  INTERVAL HISTORY: Please see below for problem oriented charting. She returns with her daughter to review test results Since last time I saw her, she received Covid shot on the left arm which corresponded to the small lymphadenopathy seen on the axilla She has noticed some fluid retention and some shortness of breath on exertion No recent infection, fever or chills She had very mild residual neuropathy but it does not affect her ability to walk or cause any risk of fall  SUMMARY OF ONCOLOGIC HISTORY: Oncology History Overview Note  Clear cell features MSI stable   Endometrial cancer ([place])  09/24/2018 Initial Diagnosis   She presented with post menopausal bleeding   11/20/2018 Initial Biopsy   EMB 11/10: G3 carcinoma with clear cell features.  Pap 11/10: AGC    12/03/2018 Surgery   TRH/BSO, bil SLNs   12/05/2018 Imaging   CT C/A/P:  1. Low attenuation mass or fluid in the endometrial cavity (series 2, image 110), in keeping with known endometrial malignancy. 2. Numerous small bilateral ground-glass pulmonary nodules. Although nonspecific these are likely infectious or inflammatory given appearance, isolated manifestation of metastatic disease much less favored. 3. Enlarged mediastinal and hilar lymph nodes, likely reactive given pulmonary findings. 4. No evidence of metastatic disease in the abdomen or pelvis. 5. Nonobstructive right nephrolithiasis. 6. Aortic Atherosclerosis (ICD10-I70.0).   01/02/2019 Pathologic Stage   Stage II Gr3 (clear cell features), no LVSI, cervial stroma involved, <50% MI MSI-low  A. UTERUS, CERVIX, BILATERAL FALLOPIAN TUBES, OVARIES, RESECTION:  - Uterus:       Endomyometrium: Poorly differentiated  carcinoma with clear cell features, spanning 3.5 cm, see comment.            Tumor limited to upper half of myometrium.            Leiomyoma.             See oncology table.       Serosa: Fibrous adhesions with endosalpingiosis. No malignancy.  - Cervix: Stroma involved by tumor.  - Bilateral ovaries: Inclusion cysts. No malignancy.  - Bilateral fallopian tubes: Unremarkable. No malignancy.   B. LYMPH NODE, RIGHT OBTURATOR, SENTINEL, BIOPSY:  - One of one lymph nodes negative for carcinoma (0/1).   C. LYMPH NODE, LEFT EXTERNAL ILIAC, SENTINEL, BIOPSY:  - One of one lymph nodes negative for carcinoma (0/1).   D. LYMPH NODE, LEFT EXTERNAL ILIAC, BIOPSY:  - One of one lymph nodes negative for carcinoma (0/1).   ONCOLOGY TABLE:   UTERUS, CARCINOMA OR CARCINOSARCOMA   Procedure: Total hysterectomy and bilateral salpingo-oophorectomy  Histologic type: Poorly differentiated carcinoma with clear cell  features, see comment.  Histologic Grade: High-grade  Myometrial invasion:       Depth of invasion:9 mm       Myometrial thickness: 20 mm  Uterine Serosa Involvement: Not identified  Cervical stromal involvement: Present  Extent of involvement of other organs: Not identified  Lymphovascular invasion: Not identified  Regional Lymph Nodes:       Examined:     3 Sentinel                               0 non-sentinel                               3 total        Lymph nodes with metastasis: 0        Isolated tumor cells (<0.2 mm): 0        Micrometastasis:  (>0.2 mm and < 2.0 mm): 0        Macrometastasis: (>2.0 mm): 0        Extracapsular extension: Not applicable  Representative Tumor Block: A5  MMR / MSI testing: Will be ordered  Pathologic Stage Classification (pTNM, AJCC 8th edition):  pT2, pNX  Comments: The tumor consists of solid sheets and more tubulocystic areas with focal areas of cytoplasmic clearing. Immunohistochemistry is positive for racemase (weak), Napsin-A (focal), and p53 (wild type). ER and PR are negative. While extensive clear cell changes are not seen in the more solid components, the immunoprofile along with  more definitive areas of clear cell changes favor the overall tumor to be a clear cell carcinoma. The tumor has a more pushing type of invasion. There is stromal invasion in areas of endocervical mucosa. Immunohistochemistry on the serosa adhesions reveals mesothelial cells (calretinin) and a small focus of endosalpingiosis (PAX-8, ER, MOC31). Pancytokeratin on the lymph nodes is negative.    01/18/2019 Cancer Staging   Staging form: Corpus Uteri - Carcinoma and Carcinosarcoma, AJCC 8th Edition - Pathologic: Stage II (pT2, pN0, cM0) - Signed by Heath Lark, MD on 01/18/2019   02/04/2019 - 05/21/2019 Chemotherapy   The patient had carboplatin and taxol   06/20/2019 Imaging   IMPRESSION: 1. Interval hysterectomy. No abdominopelvic adenopathy or evidence of metastatic disease. 2. Relatively similar appearance of the chest since 12/05/2018. Mosaic attenuation throughout the lungs which could be related  to air trapping or heterogeneous ground-glass. Given cardiomegaly and extensive postsurgical changes of mitral and tricuspid valve repair, ground-glass as can be seen with mild pulmonary edema is considered. 3. Similar thoracic adenopathy, favored to be reactive or related to a component of congestive heart failure. 4. Increased size of small left axillary nodes, possibly related to recent COVID-19 vaccine. Correlate with clinical history. 5. A subtle right upper lobe pulmonary nodule is felt to be similar in size but slightly more distinct in appearance today. Recommend attention on follow-up. 6. Right nephrolithiasis. 7. Pulmonary artery enlargement suggests pulmonary arterial hypertension. 8. Cholelithiasis.     REVIEW OF SYSTEMS:   Constitutional: Denies fevers, chills or abnormal weight loss Eyes: Denies blurriness of vision Ears, nose, mouth, throat, and face: Denies mucositis or sore throat Cardiovascular: Denies palpitation, chest discomfort  Gastrointestinal:  Denies nausea, heartburn or  change in bowel habits Skin: Denies abnormal skin rashes Lymphatics: Denies new lymphadenopathy or easy bruising Neurological:Denies numbness, tingling or new weaknesses Behavioral/Psych: Mood is stable, no new changes  All other systems were reviewed with the patient and are negative.  I have reviewed the past medical history, past surgical history, social history and family history with the patient and they are unchanged from previous note.  ALLERGIES:  is allergic to chlorhexidine.  MEDICATIONS:  Current Outpatient Medications  Medication Sig Dispense Refill  . amiodarone (PACERONE) 200 MG tablet TAKE 1 TABLET(200 MG) BY MOUTH DAILY (Patient taking differently: Take 200 mg by mouth daily. ) 90 tablet 3  . amLODipine (NORVASC) 5 MG tablet Take 1 tablet (5 mg total) by mouth daily. 180 tablet 3  . atorvastatin (LIPITOR) 40 MG tablet Take 1 tablet (40 mg total) by mouth daily. 90 tablet 3  . calcium carbonate (OS-CAL - DOSED IN MG OF ELEMENTAL CALCIUM) 1250 MG tablet Take 1 tablet by mouth daily.      . carvedilol (COREG) 12.5 MG tablet Take 1 tablet (12.5 mg total) by mouth 2 (two) times daily with a meal. 180 tablet 3  . Cholecalciferol (VITAMIN D) 1000 UNITS capsule Take 1,000 Units by mouth daily.     . cyclobenzaprine (FLEXERIL) 10 MG tablet Take 10 mg by mouth daily.     [name] dexamethasone (DECADRON) 4 MG tablet Take 2 tabs at the night before chemotherapy, every 3 weeks, by mouth x 6 cycles, please dispense 12 tabs 60 tablet 0  . insulin glargine (LANTUS SOLOSTAR) 100 UNIT/ML injection Inject 30-35 Units into the skin See admin instructions. Inject 30 units into the skin in the morning and 35 units at bedtime.    . irbesartan (AVAPRO) 300 MG tablet Take 1 tablet (300 mg total) by mouth daily. 90 tablet 3  . metFORMIN (GLUCOPHAGE) 500 MG tablet Take 500 mg by mouth 2 (two) times daily with a meal.     . methocarbamol (ROBAXIN) 500 MG tablet Take 500 mg by mouth every 8 (eight) hours as  needed for muscle spasms.     [name] senna (SENOKOT) 8.6 MG TABS tablet Take 1 tablet (8.6 mg total) by mouth daily as needed for up to 15 doses for mild constipation. 15 tablet 0  . warfarin (COUMADIN) 4 MG tablet Take 1 to 1.5 tablets daily as directed by [facility] 135 tablet 1   No current facility-administered medications for this visit.    PHYSICAL EXAMINATION: ECOG PERFORMANCE STATUS: 1 - Symptomatic but completely ambulatory  Vitals:   06/21/19 1054  BP: 137/69  Pulse: 83  Resp:  18  Temp: 98 F (36.7 C)  SpO2: 96%   Filed Weights   06/21/19 1054  Weight: 235 lb 9.6 oz (106.9 kg)    GENERAL:alert, no distress and comfortable NEURO: alert & oriented x 3 with fluent speech, no focal motor/sensory deficits  LABORATORY DATA:  I have reviewed the data as listed    Component Value Date/Time   NA 144 06/20/2019 0759   NA 140 01/10/2019 0839   K 3.5 06/20/2019 0759   CL 106 06/20/2019 0759   CO2 27 06/20/2019 0759   GLUCOSE 98 06/20/2019 0759   BUN 19 06/20/2019 0759   BUN 12 01/10/2019 0839   CREATININE 1.02 (H) 06/20/2019 0759   CREATININE 0.67 05/28/2015 0958   CALCIUM 9.1 06/20/2019 0759   PROT 7.3 06/20/2019 0759   PROT 7.7 10/03/2016 0936   ALBUMIN 3.7 06/20/2019 0759   ALBUMIN 4.4 10/03/2016 0936   AST 20 06/20/2019 0759   ALT 13 06/20/2019 0759   ALKPHOS 93 06/20/2019 0759   BILITOT 0.5 06/20/2019 0759   GFRNONAA 58 (L) 06/20/2019 0759   GFRAA >60 06/20/2019 0759    No results found for: SPEP, UPEP  Lab Results  Component Value Date   WBC 3.9 (L) 06/20/2019   NEUTROABS 2.5 06/20/2019   HGB 8.6 (L) 06/20/2019   HCT 27.6 (L) 06/20/2019   MCV 101.8 (H) 06/20/2019   PLT 134 (L) 06/20/2019      Chemistry      Component Value Date/Time   NA 144 06/20/2019 0759   NA 140 01/10/2019 0839   K 3.5 06/20/2019 0759   CL 106 06/20/2019 0759   CO2 27 06/20/2019 0759   BUN 19 06/20/2019 0759   BUN 12 01/10/2019 0839   CREATININE 1.02 (H) 06/20/2019  0759   CREATININE 0.67 05/28/2015 0958      Component Value Date/Time   CALCIUM 9.1 06/20/2019 0759   ALKPHOS 93 06/20/2019 0759   AST 20 06/20/2019 0759   ALT 13 06/20/2019 0759   BILITOT 0.5 06/20/2019 0759       RADIOGRAPHIC STUDIES: I reviewed multiple CT imaging with the patient and family I have personally reviewed the radiological images as listed and agreed with the findings in the report. CT CHEST W CONTRAST  Result Date: 06/20/2019 CLINICAL DATA:  Uterine/cervical cancer. Evaluate treatment response. Abdominal bloating. EXAM: CT CHEST, ABDOMEN, AND PELVIS WITH CONTRAST TECHNIQUE: Multidetector CT imaging of the chest, abdomen and pelvis was performed following the standard protocol during bolus administration of intravenous contrast. CONTRAST:  154m OMNIPAQUE IOHEXOL 300 MG/ML  SOLN COMPARISON:  12/05/2018 FINDINGS: CT CHEST FINDINGS Cardiovascular: Aortic atherosclerosis. Tortuous thoracic aorta. Moderate cardiomegaly with left atrial enlargement. Mitral valve and tricuspid valve repairs. Median sternotomy. Left atrial appendage occlusion device. No pericardial effusion. Pulmonary artery enlargement, outflow tract 3.9 cm. No central pulmonary embolism, on this non-dedicated study. Mediastinum/Nodes: No supraclavicular adenopathy. Small left axillary nodes of up to 8 mm are increased in size since the prior exam. Right paratracheal node measures 1.4 cm on 17/2 and is similar to on the prior (when remeasured). Prevascular nodes of up to 8 mm on 16/2 are not significantly changed. No hilar adenopathy. Lungs/Pleura: Trace left pleural fluid, new. Lingular scarring or subsegmental atelectasis. Mild mosaic attenuation, which may relate to air trapping/mosaic perfusion or heterogeneous ground-glass. This a vague right upper lobe 5 mm nodule on 46/4 is slightly more well-defined today but similar in size to on the prior. Musculoskeletal: Median sternotomy.  No  acute osseous abnormality. CT  ABDOMEN PELVIS FINDINGS Hepatobiliary: Probable perfusion anomaly within the subcapsular posterior right hepatic lobe on 49/2. No evidence of hepatic metastasis. Tiny dependent gallstones. No acute cholecystitis or biliary duct dilatation. Pancreas: Normal, without mass or ductal dilatation. Spleen: Normal in size, without focal abnormality. Adrenals/Urinary Tract: Normal adrenal glands. Punctate right renal collecting system calculi. 9 mm interpolar left renal low-density lesion is likely a cyst. An upper pole left renal lesion is too small to characterize. Normal right kidney. No hydronephrosis. Normal urinary bladder. Stomach/Bowel: Normal stomach, without wall thickening. Normal colon, appendix, and terminal ileum. Normal small bowel. Vascular/Lymphatic: Aortic atherosclerosis. No abdominal and no pelvic sidewall adenopathy. Reproductive: Hysterectomy.  No adnexal mass. Other: No significant free fluid. No evidence of omental or peritoneal disease. Musculoskeletal: Presumed injection sites within the subcutaneous fat of the anterior abdomen and pelvis. IMPRESSION: 1. Interval hysterectomy. No abdominopelvic adenopathy or evidence of metastatic disease. 2. Relatively similar appearance of the chest since 12/05/2018. Mosaic attenuation throughout the lungs which could be related to air trapping or heterogeneous ground-glass. Given cardiomegaly and extensive postsurgical changes of mitral and tricuspid valve repair, ground-glass as can be seen with mild pulmonary edema is considered. 3. Similar thoracic adenopathy, favored to be reactive or related to a component of congestive heart failure. 4. Increased size of small left axillary nodes, possibly related to recent COVID-19 vaccine. Correlate with clinical history. 5. A subtle right upper lobe pulmonary nodule is felt to be similar in size but slightly more distinct in appearance today. Recommend attention on follow-up. 6. Right nephrolithiasis. 7. Pulmonary artery  enlargement suggests pulmonary arterial hypertension. 8. Cholelithiasis. Electronically Signed   By: Abigail Miyamoto M.D.   On: 06/20/2019 16:29   CT ABDOMEN PELVIS W CONTRAST  Result Date: 06/20/2019 CLINICAL DATA:  Uterine/cervical cancer. Evaluate treatment response. Abdominal bloating. EXAM: CT CHEST, ABDOMEN, AND PELVIS WITH CONTRAST TECHNIQUE: Multidetector CT imaging of the chest, abdomen and pelvis was performed following the standard protocol during bolus administration of intravenous contrast. CONTRAST:  170m OMNIPAQUE IOHEXOL 300 MG/ML  SOLN COMPARISON:  12/05/2018 FINDINGS: CT CHEST FINDINGS Cardiovascular: Aortic atherosclerosis. Tortuous thoracic aorta. Moderate cardiomegaly with left atrial enlargement. Mitral valve and tricuspid valve repairs. Median sternotomy. Left atrial appendage occlusion device. No pericardial effusion. Pulmonary artery enlargement, outflow tract 3.9 cm. No central pulmonary embolism, on this non-dedicated study. Mediastinum/Nodes: No supraclavicular adenopathy. Small left axillary nodes of up to 8 mm are increased in size since the prior exam. Right paratracheal node measures 1.4 cm on 17/2 and is similar to on the prior (when remeasured). Prevascular nodes of up to 8 mm on 16/2 are not significantly changed. No hilar adenopathy. Lungs/Pleura: Trace left pleural fluid, new. Lingular scarring or subsegmental atelectasis. Mild mosaic attenuation, which may relate to air trapping/mosaic perfusion or heterogeneous ground-glass. This a vague right upper lobe 5 mm nodule on 46/4 is slightly more well-defined today but similar in size to on the prior. Musculoskeletal: Median sternotomy.  No acute osseous abnormality. CT ABDOMEN PELVIS FINDINGS Hepatobiliary: Probable perfusion anomaly within the subcapsular posterior right hepatic lobe on 49/2. No evidence of hepatic metastasis. Tiny dependent gallstones. No acute cholecystitis or biliary duct dilatation. Pancreas: Normal, without  mass or ductal dilatation. Spleen: Normal in size, without focal abnormality. Adrenals/Urinary Tract: Normal adrenal glands. Punctate right renal collecting system calculi. 9 mm interpolar left renal low-density lesion is likely a cyst. An upper pole left renal lesion is too small to characterize. Normal right kidney. No  hydronephrosis. Normal urinary bladder. Stomach/Bowel: Normal stomach, without wall thickening. Normal colon, appendix, and terminal ileum. Normal small bowel. Vascular/Lymphatic: Aortic atherosclerosis. No abdominal and no pelvic sidewall adenopathy. Reproductive: Hysterectomy.  No adnexal mass. Other: No significant free fluid. No evidence of omental or peritoneal disease. Musculoskeletal: Presumed injection sites within the subcutaneous fat of the anterior abdomen and pelvis. IMPRESSION: 1. Interval hysterectomy. No abdominopelvic adenopathy or evidence of metastatic disease. 2. Relatively similar appearance of the chest since 12/05/2018. Mosaic attenuation throughout the lungs which could be related to air trapping or heterogeneous ground-glass. Given cardiomegaly and extensive postsurgical changes of mitral and tricuspid valve repair, ground-glass as can be seen with mild pulmonary edema is considered. 3. Similar thoracic adenopathy, favored to be reactive or related to a component of congestive heart failure. 4. Increased size of small left axillary nodes, possibly related to recent COVID-19 vaccine. Correlate with clinical history. 5. A subtle right upper lobe pulmonary nodule is felt to be similar in size but slightly more distinct in appearance today. Recommend attention on follow-up. 6. Right nephrolithiasis. 7. Pulmonary artery enlargement suggests pulmonary arterial hypertension. 8. Cholelithiasis. Electronically Signed   By: Abigail Miyamoto M.D.   On: 06/20/2019 16:29

## 2019-06-21 NOTE — Assessment & Plan Note (Signed)
She has mild peripheral neuropathy from treatment I anticipate it will improve in the future We discussed the risk and benefits of referral to physical therapy and rehab for therapy but she would like to see how she feels and will call me if she is interested

## 2019-06-24 ENCOUNTER — Ambulatory Visit: Payer: Medicare Other | Admitting: *Deleted

## 2019-06-24 ENCOUNTER — Other Ambulatory Visit: Payer: Self-pay

## 2019-06-24 DIAGNOSIS — I059 Rheumatic mitral valve disease, unspecified: Secondary | ICD-10-CM

## 2019-06-24 DIAGNOSIS — Z5181 Encounter for therapeutic drug level monitoring: Secondary | ICD-10-CM

## 2019-06-24 LAB — POCT INR: INR: 3.5 — AB (ref 2.0–3.0)

## 2019-06-24 NOTE — Patient Instructions (Signed)
Description   Continue on same dosage 2 tablets daily except for 1.5 tablets on Mondays, Wednesdays, and Fridays. Recheck INR in 6 weeks. Call us with any medication changes or concerns to [facility] @ [phone].

## 2019-07-10 ENCOUNTER — Other Ambulatory Visit: Payer: Self-pay | Admitting: Physician Assistant

## 2019-07-10 DIAGNOSIS — Z1231 Encounter for screening mammogram for malignant neoplasm of breast: Secondary | ICD-10-CM

## 2019-08-02 ENCOUNTER — Encounter: Payer: Self-pay | Admitting: Gynecologic Oncology

## 2019-08-02 ENCOUNTER — Other Ambulatory Visit: Payer: Self-pay

## 2019-08-02 ENCOUNTER — Inpatient Hospital Stay: Payer: Medicare Other | Attending: Gynecologic Oncology | Admitting: Gynecologic Oncology

## 2019-08-02 VITALS — BP 209/91 | HR 88 | Temp 98.5°F | Resp 16 | Ht 66.0 in | Wt 231.0 lb

## 2019-08-02 DIAGNOSIS — Z90710 Acquired absence of both cervix and uterus: Secondary | ICD-10-CM | POA: Insufficient documentation

## 2019-08-02 DIAGNOSIS — I5032 Chronic diastolic (congestive) heart failure: Secondary | ICD-10-CM | POA: Insufficient documentation

## 2019-08-02 DIAGNOSIS — Z08 Encounter for follow-up examination after completed treatment for malignant neoplasm: Secondary | ICD-10-CM | POA: Diagnosis not present

## 2019-08-02 DIAGNOSIS — Z8542 Personal history of malignant neoplasm of other parts of uterus: Secondary | ICD-10-CM | POA: Insufficient documentation

## 2019-08-02 DIAGNOSIS — E119 Type 2 diabetes mellitus without complications: Secondary | ICD-10-CM | POA: Diagnosis not present

## 2019-08-02 DIAGNOSIS — I110 Hypertensive heart disease with heart failure: Secondary | ICD-10-CM | POA: Insufficient documentation

## 2019-08-02 DIAGNOSIS — Z7952 Long term (current) use of systemic steroids: Secondary | ICD-10-CM | POA: Diagnosis not present

## 2019-08-02 DIAGNOSIS — E7800 Pure hypercholesterolemia, unspecified: Secondary | ICD-10-CM | POA: Insufficient documentation

## 2019-08-02 DIAGNOSIS — Z7901 Long term (current) use of anticoagulants: Secondary | ICD-10-CM | POA: Diagnosis not present

## 2019-08-02 DIAGNOSIS — Z9221 Personal history of antineoplastic chemotherapy: Secondary | ICD-10-CM | POA: Insufficient documentation

## 2019-08-02 DIAGNOSIS — Z923 Personal history of irradiation: Secondary | ICD-10-CM | POA: Insufficient documentation

## 2019-08-02 DIAGNOSIS — Z90722 Acquired absence of ovaries, bilateral: Secondary | ICD-10-CM | POA: Diagnosis not present

## 2019-08-02 DIAGNOSIS — Z8673 Personal history of transient ischemic attack (TIA), and cerebral infarction without residual deficits: Secondary | ICD-10-CM | POA: Diagnosis not present

## 2019-08-02 DIAGNOSIS — Z794 Long term (current) use of insulin: Secondary | ICD-10-CM | POA: Insufficient documentation

## 2019-08-02 DIAGNOSIS — G4730 Sleep apnea, unspecified: Secondary | ICD-10-CM | POA: Insufficient documentation

## 2019-08-02 DIAGNOSIS — J45909 Unspecified asthma, uncomplicated: Secondary | ICD-10-CM | POA: Diagnosis not present

## 2019-08-02 DIAGNOSIS — E669 Obesity, unspecified: Secondary | ICD-10-CM | POA: Insufficient documentation

## 2019-08-02 DIAGNOSIS — I700 Atherosclerosis of aorta: Secondary | ICD-10-CM | POA: Insufficient documentation

## 2019-08-02 DIAGNOSIS — Z79899 Other long term (current) drug therapy: Secondary | ICD-10-CM | POA: Diagnosis not present

## 2019-08-02 DIAGNOSIS — I4891 Unspecified atrial fibrillation: Secondary | ICD-10-CM | POA: Diagnosis not present

## 2019-08-02 DIAGNOSIS — C541 Malignant neoplasm of endometrium: Secondary | ICD-10-CM

## 2019-08-02 NOTE — Progress Notes (Signed)
Gynecologic Oncology Return Clinic Visit  08/02/19  Reason for Visit: Follow-up in the setting of recent completion of therapy for high risk uterine cancer  Treatment History: Oncology History Overview Note  Clear cell features MSI stable   Endometrial cancer ([place])  09/24/2018 Initial Diagnosis   She presented with post menopausal bleeding   11/20/2018 Initial Biopsy   EMB 11/10: G3 carcinoma with clear cell features.  Pap 11/10: AGC    12/03/2018 Surgery   TRH/BSO, bil SLNs   12/05/2018 Imaging   CT C/A/P:  1. Low attenuation mass or fluid in the endometrial cavity (series 2, image 110), in keeping with known endometrial malignancy. 2. Numerous small bilateral ground-glass pulmonary nodules. Although nonspecific these are likely infectious or inflammatory given appearance, isolated manifestation of metastatic disease much less favored. 3. Enlarged mediastinal and hilar lymph nodes, likely reactive given pulmonary findings. 4. No evidence of metastatic disease in the abdomen or pelvis. 5. Nonobstructive right nephrolithiasis. 6. Aortic Atherosclerosis (ICD10-I70.0).   01/02/2019 Pathologic Stage   Stage II Gr3 (clear cell features), no LVSI, cervial stroma involved, <50% MI MSI-low  A. 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            Tumor limited to upper half of myometrium.            Leiomyoma.            See oncology table.       Serosa: Fibrous adhesions with endosalpingiosis. No malignancy.  - Cervix: Stroma involved by tumor.  - Bilateral ovaries: Inclusion cysts. No malignancy.  - Bilateral fallopian tubes: Unremarkable. No malignancy.   B. LYMPH NODE, RIGHT OBTURATOR, SENTINEL, BIOPSY:  - One of one lymph nodes negative for carcinoma (0/1).   C. LYMPH NODE, LEFT EXTERNAL ILIAC, SENTINEL, BIOPSY:  - One of one lymph  nodes negative for carcinoma (0/1).   D. LYMPH NODE, LEFT EXTERNAL ILIAC, BIOPSY:  - One of one lymph nodes negative for carcinoma (0/1).   ONCOLOGY TABLE:   UTERUS, CARCINOMA OR CARCINOSARCOMA   Procedure: Total hysterectomy and bilateral salpingo-oophorectomy  Histologic type: Poorly differentiated carcinoma with clear cell  features, see comment.  Histologic Grade: High-grade  Myometrial invasion:       Depth of invasion:9 mm       Myometrial thickness: 20 mm  Uterine Serosa Involvement: Not identified  Cervical stromal involvement: Present  Extent of involvement of other organs: Not identified  Lymphovascular invasion: Not identified  Regional Lymph Nodes:       Examined:     3 Sentinel                               0 non-sentinel                               3 total        Lymph nodes with metastasis: 0        Isolated tumor cells (<0.2 mm): 0        Micrometastasis:  (>0.2 mm and < 2.0 mm): 0        Macrometastasis: (>2.0 mm): 0        Extracapsular extension: Not applicable  Representative Tumor Block: A5  MMR / MSI testing:  Will be ordered  Pathologic Stage Classification (pTNM, AJCC 8th edition):  pT2, pNX  Comments: The tumor consists of solid sheets and more tubulocystic areas with focal areas of cytoplasmic clearing. Immunohistochemistry is positive for racemase (weak), Napsin-A (focal), and p53 (wild type). ER and PR are negative. While extensive clear cell changes are not seen in the more solid components, the immunoprofile along with more definitive areas of clear cell changes favor the overall tumor to be a clear cell carcinoma. The tumor has a more pushing type of invasion. There is stromal invasion in areas of endocervical mucosa. Immunohistochemistry on the serosa adhesions reveals mesothelial cells (calretinin) and a small focus of endosalpingiosis (PAX-8, ER, MOC31). Pancytokeratin on the lymph nodes is negative.    01/18/2019 Cancer Staging   Staging form:  Corpus Uteri - Carcinoma and Carcinosarcoma, AJCC 8th Edition - Pathologic: Stage II (pT2, pN0, cM0) - Signed by Heath Lark, MD on 01/18/2019   02/04/2019 - 05/21/2019 Chemotherapy   The patient had carboplatin and taxol   03/18/2019 - 04/15/2019 Radiation Therapy    Vagina: Pelvis HDR-brachy 30/30 6 5/5 Ir-192      06/20/2019 Imaging   IMPRESSION: 1. Interval hysterectomy. No abdominopelvic adenopathy or evidence of metastatic disease. 2. Relatively similar appearance of the chest since 12/05/2018. Mosaic attenuation throughout the lungs which could be related to air trapping or heterogeneous ground-glass. Given cardiomegaly and extensive postsurgical changes of mitral and tricuspid valve repair, ground-glass as can be seen with mild pulmonary edema is considered. 3. Similar thoracic adenopathy, favored to be reactive or related to a component of congestive heart failure. 4. Increased size of small left axillary nodes, possibly related to recent COVID-19 vaccine. Correlate with clinical history. 5. A subtle right upper lobe pulmonary nodule is felt to be similar in size but slightly more distinct in appearance today. Recommend attention on follow-up. 6. Right nephrolithiasis. 7. Pulmonary artery enlargement suggests pulmonary arterial hypertension. 8. Cholelithiasis.     Interval History: The patient overall reports doing well during adjuvant treatment.  She has had both of her Covid vaccines now.  She tolerated the first 1 without significant issues.  She developed a large blackhead on her left breast with the second injection that is still healing.  She denies any vaginal bleeding or discharge since I last saw her.  She reports having a good appetite without nausea or emesis.  She reports normal bowel function but notes having some bloating if she eats a lot of starch.  She has been using her vaginal dilators intermittently.  She denies any headache, vision changes, or other symptoms today  related to her blood pressure.  The patient last saw radiation oncology in early May and followed up with medical oncology last month.  Past Medical/Surgical History: Past Medical History:  Diagnosis Date  . Abnormal chest CT   . Aortic atherosclerosis ([place])   . Asthma   . Atrial enlargement, left    severe  . Atrial fibrillation ([place]) 01/05/2011   Chronic persistent, failed DCCV   . Atrial flutter ([place])   . Bronchospasm 1998  . Cardiomegaly   . Carotid bruit   . Chronic diastolic congestive heart failure ([place])   . Diabetes mellitus    insulin dependent  . Ejection fraction < 50%    35-40%,   . Heart murmur   . History of blood transfusion   . Hypercholesterolemia   . Hypertension   . Junctional rhythm 09/14/2018   Noted on EKG  .  Left bundle branch block (LBBB) 09/14/2018   Noted on EKG  . LVH (left ventricular hypertrophy) 10/10/2016   Moderate, Noted on ECHO  . Mitral regurgitation   . Obesity   . Obesity (BMI 30-39.9) 01/16/2012  . Persistent atrial fibrillation ([place])   . Polyp of rectum   . Rheumatic fever 01/16/2012   Reported during childhood  . S/P Maze operation for atrial fibrillation 02/15/2012   Complete biatrial lesion set using bipolar radiofrequency and cryothermy ablation with clipping of LA appendage  . S/P mitral valve replacement with metallic valve 03/17/1769   29m Sorin [employer]omedics Optiform mechanical prosthesis  . 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   265mEdwards mc3 ring annuloplasty  . Shortness of breath    with exertion  . Sleep apnea    DOES NOT HAVE CPAP  . [facility]   Post op , left arm weakness  . Tricuspid regurgitation 01/16/2012  . [facility]    Past Surgical History:  Procedure Laterality Date  . CARDIAC CATHETERIZATION  >5 years  . CARDIOVASCULAR STRESS TEST  09/2010  . CARDIOVERSION  03/25/2011   Procedure: CARDIOVERSION;  Surgeon: JaJettie BoozeMD;  [facility]: Cardiovascular;   Laterality: N/A;  . CARDIOVERSION N/A 07/27/2012   Procedure: CARDIOVERSION;  Surgeon: JaJettie BoozeMD;  [facility]: Cardiovascular;  Laterality: N/A;  . CESAREAN SECTION    . COLONOSCOPY WITH PROPOFOL N/A 04/27/2016   Procedure: COLONOSCOPY WITH PROPOFOL;  Surgeon: ViWilford CornerMD;  [facility];  Service: Endoscopy;  Laterality: N/A;  . INTRAOPERATIVE TRANSESOPHAGEAL ECHOCARDIOGRAM  02/15/2012   Procedure: INTRAOPERATIVE TRANSESOPHAGEAL ECHOCARDIOGRAM;  Surgeon: ClRexene AlbertsMD;  [facility]: Open Heart Surgery;  Laterality: N/A;  . MAZE  02/15/2012   Procedure: MAZE;  Surgeon: ClRexene AlbertsMD;  [place] Service: Open Heart Surgery;  Laterality: N/A;  . MITRAL VALVE REPLACEMENT  02/15/2012   Procedure: MITRAL VALVE (MV) REPLACEMENT;  Surgeon: ClRexene AlbertsMD;  [facility]: Open Heart Surgery;  Laterality: N/A;  . ROBOTIC ASSISTED LAPAROSCOPIC HYSTERECTOMY AND SALPINGECTOMY Bilateral 01/02/2019   Procedure: XI ROBOTIC ASSISTED LAPAROSCOPIC TOTAL HYSTERECTOMY WITH BILATERAL SALPINGOOPHORECTOMY, SENTINEL LYMPH NODE BIOPSY;  Surgeon: TuLafonda MossesMD;  Location: [facility];  Service: Gynecology;  Laterality: Bilateral;  . TEE WITHOUT CARDIOVERSION  12/21/2011   Procedure: TRANSESOPHAGEAL ECHOCARDIOGRAM (TEE);  Surgeon: JaJettie BoozeMD;  [facility];  Service: Cardiovascular;  Laterality: N/A;  . TRICUSPID VALVE REPLACEMENT  02/15/2012   Procedure: TRICUSPID VALVE REPAIR;  Surgeon: ClRexene AlbertsMD;  [place] Service: Open Heart Surgery;  Laterality: N/A;  . TUBAL LIGATION     at time of her c-section    Family History  Problem Relation Age of Onset  . Asthma Mother   . Diabetes Mother   . Hypertension Mother   . Hypertension Brother   . Heart attack Neg Hx   . Stroke Neg Hx   . Colon cancer Neg Hx   . Colon polyps Neg Hx   . Liver disease Neg Hx   . Uterine cancer Neg  Hx   . Ovarian cancer Neg Hx   . Breast cancer Neg Hx     Social History   Socioeconomic History  . Marital status: Married    Spouse name: Not on file  . Number of children: Not on file  . Years of  education: Not on file  . Highest education level: Not on file  Occupational History  . Occupation: Orthoptist: [employer]  Tobacco Use  . Smoking status: Never Smoker  . Smokeless tobacco: Never Used  Vaping Use  . Vaping Use: Never used  Substance and Sexual Activity  . Alcohol use: No  . Drug use: No  . Sexual activity: Yes    Birth control/protection: Post-menopausal  Other Topics Concern  . Not on file  Social History Narrative   Pt lives in [place] with spouse.  Works at [employer]. 2 grown children, 1 grandchild   Social Determinants of Radio broadcast assistant Strain:   . Difficulty of Paying Living Expenses:   Food Insecurity:   . Worried About Charity fundraiser in the Last Year:   . Arboriculturist in the Last Year:   Transportation Needs:   . Film/video editor (Medical):   [name] Lack of Transportation (Non-Medical):   Physical Activity:   . Days of Exercise per Week:   . Minutes of Exercise per Session:   Stress:   . Feeling of Stress :   Social Connections:   . Frequency of Communication with Friends and Family:   . Frequency of Social Gatherings with Friends and Family:   . Attends Religious Services:   . Active Member of Clubs or Organizations:   . Attends Archivist Meetings:   [name] Marital Status:     Current Medications:  Current Outpatient Medications:  .  amiodarone (PACERONE) 200 MG tablet, TAKE 1 TABLET(200 MG) BY MOUTH DAILY (Patient taking differently: Take 200 mg by mouth daily. ), Disp: 90 tablet, Rfl: 3 .  amLODipine (NORVASC) 5 MG tablet, Take 1 tablet (5 mg total) by mouth daily., Disp: 180 tablet, Rfl: 3 .  atorvastatin (LIPITOR) 40 MG tablet, Take 1 tablet (40 mg total) by  mouth daily., Disp: 90 tablet, Rfl: 3 .  calcium carbonate (OS-CAL - DOSED IN MG OF ELEMENTAL CALCIUM) 1250 MG tablet, Take 1 tablet by mouth daily.  , Disp: , Rfl:  .  carvedilol (COREG) 12.5 MG tablet, Take 1 tablet (12.5 mg total) by mouth 2 (two) times daily with a meal., Disp: 180 tablet, Rfl: 3 .  Cholecalciferol (VITAMIN D) 1000 UNITS capsule, Take 1,000 Units by mouth daily. , Disp: , Rfl:  .  cyclobenzaprine (FLEXERIL) 10 MG tablet, Take 10 mg by mouth daily. , Disp: , Rfl:  .  dexamethasone (DECADRON) 4 MG tablet, Take 2 tabs at the night before chemotherapy, every 3 weeks, by mouth x 6 cycles, please dispense 12 tabs, Disp: 60 tablet, Rfl: 0 .  insulin glargine (LANTUS SOLOSTAR) 100 UNIT/ML injection, Inject 30-35 Units into the skin See admin instructions. Inject 30 units into the skin in the morning and 35 units at bedtime., Disp: , Rfl:  .  irbesartan (AVAPRO) 300 MG tablet, Take 1 tablet (300 mg total) by mouth daily., Disp: 90 tablet, Rfl: 3 .  metFORMIN (GLUCOPHAGE) 500 MG tablet, Take 500 mg by mouth 2 (two) times daily with a meal. , Disp: , Rfl:  .  methocarbamol (ROBAXIN) 500 MG tablet, Take 500 mg by mouth every 8 (eight) hours as needed for muscle spasms. , Disp: , Rfl:  .  senna (SENOKOT) 8.6 MG TABS tablet, Take 1 tablet (8.6 mg total) by mouth daily as needed for up to 15 doses for mild constipation., Disp: 15 tablet,  Rfl: 0 .  warfarin (COUMADIN) 4 MG tablet, Take 1 to 1.5 tablets daily as directed by Coumadin Clinic, Disp: 135 tablet, Rfl: 1  Review of Systems: + shortness of breath(ongoing), problems with walking Denies appetite changes, fevers, chills, fatigue, unexplained weight changes. Denies hearing loss, neck lumps or masses, mouth sores, ringing in ears or voice changes. Denies cough or wheezing.   Denies chest pain or palpitations. Denies leg swelling. Denies abdominal distention, pain, blood in stools, constipation, diarrhea, nausea, vomiting, or early  satiety. Denies pain with intercourse, dysuria, frequency, hematuria or incontinence. Denies hot flashes, pelvic pain, vaginal bleeding or vaginal discharge.   Denies joint pain, back pain or muscle pain/cramps. Denies itching, rash, or wounds. Denies dizziness, headaches, numbness or seizures. Denies swollen lymph nodes or glands, denies easy bruising or bleeding. Denies anxiety, depression, confusion, or decreased concentration.  Physical Exam: BP (!) 209/91 (BP Location: Right Arm, Patient Position: Sitting)   Pulse 88   Temp 98.5 F (36.9 C) (Temporal)   Resp 16   Ht '5\' 6"'  (1.676 m)   Wt (!) 231 lb (104.8 kg)   SpO2 100%   BMI 37.28 kg/m  General: Alert, oriented, no acute distress. HEENT: Normocephalic, atraumatic, sclera anicteric. Chest: Clear to auscultation bilaterally.   Cardiovascular: Regular rate and rhythm, no murmurs. Abdomen: Obese, soft, nontender.  Normoactive bowel sounds.  No masses or hepatosplenomegaly appreciated.  Well-healed laparoscopic incisions. Extremities: Grossly normal range of motion.  Warm, well perfused.  No edema bilaterally. Skin: No rashes or lesions noted. Lymphatics: No cervical, supraclavicular, or inguinal adenopathy. GU: Normal appearing external genitalia without erythema, excoriation, or lesions.  Speculum exam reveals moderately atrophic vaginal mucosa with radiation changes evident.  No lesions or masses.  No discharge or bleeding.  Bimanual exam reveals cuff intact, no masses or nodularity.  Rectovaginal exam confirms these findings.  Laboratory & Radiologic Studies: CT C/A/P on 6/10: IMPRESSION: 1. Interval hysterectomy. No abdominopelvic adenopathy or evidence of metastatic disease. 2. Relatively similar appearance of the chest since 12/05/2018. Mosaic attenuation throughout the lungs which could be related to air trapping or heterogeneous ground-glass. Given cardiomegaly and extensive postsurgical changes of mitral and tricuspid  valve repair, ground-glass as can be seen with mild pulmonary edema is considered. 3. Similar thoracic adenopathy, favored to be reactive or related to a component of congestive heart failure. 4. Increased size of small left axillary nodes, possibly related to recent COVID-19 vaccine. Correlate with clinical history. 5. A subtle right upper lobe pulmonary nodule is felt to be similar in size but slightly more distinct in appearance today. Recommend attention on follow-up. 6. Right nephrolithiasis. 7. Pulmonary artery enlargement suggests pulmonary arterial hypertension. 8. Cholelithiasis.  Assessment & Plan: [name] is a 64 y.o. 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  Overall, the patient did very well with adjuvant therapy.  She is NED on exam today.  Her blood pressure was quite elevated when she presented, improving some when taken again at the end of the visit.  She is asymptomatic.  I have cautioned that if she develops any symptoms but she needs to present to the emergency department for treatment of hypertensive urgency.  Per SGO surveillance recommendations, in the setting of high risk endometrial cancer, we will plan visits for surveillance every 3 months for the first 2 years.  We discussed the signs and symptoms that would be concerning for  disease recurrence and should prompt a phone call prior to her next visit.  20 minutes of total time was spent for this patient encounter, including preparation, face-to-face counseling with the patient and coordination of care, and documentation of the encounter.  Jeral Pinch, MD  Division of Gynecologic Oncology  Department of Obstetrics and Gynecology  [facility]

## 2019-08-02 NOTE — Patient Instructions (Addendum)
Your exam is normal today!  I am also happy to see that you have lost a couple of pounds since her last visit.  Good luck continuing that work!  I will see you back in 6 months and you will see Dr. Sondra Come in 3 months.  If you develop any vaginal bleeding, abdominal pain, or other new symptoms, please call the clinic before to be seen.  WHEN YOU COME TO SEE DR. KINARD, HIS OFFICE CAN GIVE [place] A CALL TO SCHEDULE YOUR APPOINTMENT FOR JAN 2022 OR YOU CAN CALL us CLOSER TO THE DATE AT [phone] TO ARRANGE FOR THE APPT.

## 2019-08-05 ENCOUNTER — Other Ambulatory Visit: Payer: Self-pay

## 2019-08-05 ENCOUNTER — Ambulatory Visit: Payer: Medicare Other | Admitting: *Deleted

## 2019-08-05 DIAGNOSIS — I059 Rheumatic mitral valve disease, unspecified: Secondary | ICD-10-CM | POA: Diagnosis not present

## 2019-08-05 DIAGNOSIS — Z5181 Encounter for therapeutic drug level monitoring: Secondary | ICD-10-CM | POA: Diagnosis not present

## 2019-08-05 LAB — POCT INR: INR: 5.1 — AB (ref 2.0–3.0)

## 2019-08-05 NOTE — Patient Instructions (Addendum)
Description   Do not any warfarin today and no warfarin tomorrow then continue taking Warfarin 2 tablets daily except for 1.5 tablets on Mondays, Wednesdays, and Fridays. Recheck INR in 2 weeks (usually 6 weeks). Call us with any medication changes or concerns to Coumadin Clinic @ [phone].

## 2019-08-19 ENCOUNTER — Ambulatory Visit: Payer: Medicare Other | Admitting: *Deleted

## 2019-08-19 ENCOUNTER — Other Ambulatory Visit: Payer: Self-pay

## 2019-08-19 DIAGNOSIS — Z5181 Encounter for therapeutic drug level monitoring: Secondary | ICD-10-CM

## 2019-08-19 DIAGNOSIS — I059 Rheumatic mitral valve disease, unspecified: Secondary | ICD-10-CM

## 2019-08-19 LAB — POCT INR: INR: 3.2 — AB (ref 2.0–3.0)

## 2019-08-19 NOTE — Patient Instructions (Signed)
Description   Continue taking Warfarin 2 tablets daily except for 1.5 tablets on Mondays, Wednesdays, and Fridays. Recheck INR in 3 weeks. Call us with any medication changes or concerns to Coumadin Clinic @ [phone].

## 2019-09-05 NOTE — Progress Notes (Signed)
Cardiology Office Note   Date:  09/06/2019   ID:  [name] [birth year], MRN [id number]  PCP:  Lennie Odor, PA    No chief complaint on file.  MVR  Wt Readings from Last 3 Encounters:  09/06/19 236 lb 6.4 oz (107.2 kg)  08/02/19 (!) 231 lb (104.8 kg)  06/21/19 235 lb 9.6 oz (106.9 kg)       History of Present Illness: [name] is a 64 y.o. female  who had a mitral valve replacement and TV repairin 2014. This was complicated by a post op CVA. She has had atrial flutter. Dr. Rayann Heman recommended amio followed by cardioversion. She had the cardioversion in 07/2012.   2013 cath showed: The left main coronary artery is angiographically normal.  The left anterior descending artery is angiographically normal. There is a medium sized diagonal which is widely patent.  The left circumflex artery is angiographically normal. There is a large ramus vessel which is widely patent. Large OM1 is angiographically normal.  The right coronary artery is a large dominant vessel which is angiographically normal.  She is on disability at this point. She has some trouble going up stairs, and walking some distance even on flat ground.   Coumadin hadbeendifficult toregulate.Easier to regulate now, typically 3-5weeks check.  She was diagnosed with uterine cancer and had surgery. She did well.    Past Medical History:  Diagnosis Date  . Abnormal chest CT   . Aortic atherosclerosis ([place])   . Asthma   . Atrial enlargement, left    severe  . Atrial fibrillation ([place]) 01/05/2011   Chronic persistent, failed DCCV   . Atrial flutter ([place])   . Bronchospasm 1998  . Cardiomegaly   . Carotid bruit   . Chronic diastolic congestive heart failure ([place])   . Diabetes mellitus    insulin dependent  . Ejection fraction < 50%    35-40%,   . Heart murmur   . History of blood transfusion   . Hypercholesterolemia   . Hypertension   . Junctional rhythm 09/14/2018    Noted on EKG  . Left bundle branch block (LBBB) 09/14/2018   Noted on EKG  . LVH (left ventricular hypertrophy) 10/10/2016   Moderate, Noted on ECHO  . Mitral regurgitation   . Obesity   . Obesity (BMI 30-39.9) 01/16/2012  . Persistent atrial fibrillation ([place])   . Polyp of rectum   . Rheumatic fever 01/16/2012   Reported during childhood  . S/P Maze operation for atrial fibrillation 02/15/2012   Complete biatrial lesion set using bipolar radiofrequency and cryothermy ablation with clipping of LA appendage  . S/P mitral valve replacement with metallic valve 02/15/3333   62mm Sorin Carbomedics Optiform mechanical prosthesis  . 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 Shortness of breath    with exertion  . Sleep apnea    DOES NOT HAVE CPAP  . Stroke [facility]) 2014   Post op , left arm weakness  . Tricuspid regurgitation 01/16/2012  . Uterine cancer [facility])     Past Surgical History:  Procedure Laterality Date  . CARDIAC CATHETERIZATION  >5 years  . CARDIOVASCULAR STRESS TEST  09/2010  . CARDIOVERSION  03/25/2011   Procedure: CARDIOVERSION;  Surgeon: Jettie Booze, MD;  Location: [place];  Service: Cardiovascular;  Laterality: N/A;  . CARDIOVERSION N/A 07/27/2012   Procedure: CARDIOVERSION;  Surgeon: Jettie Booze, MD;  Location: [place];  Service: Cardiovascular;  Laterality: N/A;  . CESAREAN SECTION    . COLONOSCOPY WITH PROPOFOL N/A 04/27/2016   Procedure: COLONOSCOPY WITH PROPOFOL;  Surgeon: Wilford Corner, MD;  Location: [facility] ENDOSCOPY;  Service: Endoscopy;  Laterality: N/A;  . INTRAOPERATIVE TRANSESOPHAGEAL ECHOCARDIOGRAM  02/15/2012   Procedure: INTRAOPERATIVE TRANSESOPHAGEAL ECHOCARDIOGRAM;  Surgeon: Rexene Alberts, MD;  Location: [place];  Service: Open Heart Surgery;  Laterality: N/A;  . MAZE  02/15/2012   Procedure: MAZE;  Surgeon: Rexene Alberts, MD;  Location: [place];  Service: Open Heart Surgery;  Laterality: N/A;  . MITRAL VALVE  REPLACEMENT  02/15/2012   Procedure: MITRAL VALVE (MV) REPLACEMENT;  Surgeon: Rexene Alberts, MD;  Location: [place];  Service: Open Heart Surgery;  Laterality: N/A;  . ROBOTIC ASSISTED LAPAROSCOPIC HYSTERECTOMY AND SALPINGECTOMY Bilateral 01/02/2019   Procedure: XI ROBOTIC ASSISTED LAPAROSCOPIC TOTAL HYSTERECTOMY WITH BILATERAL SALPINGOOPHORECTOMY, SENTINEL LYMPH NODE BIOPSY;  Surgeon: [name], MD;  Location: [facility];  Service: Gynecology;  Laterality: Bilateral;  . TEE WITHOUT CARDIOVERSION  12/21/2011   Procedure: TRANSESOPHAGEAL ECHOCARDIOGRAM (TEE);  Surgeon: Jettie Booze, MD;  Location: [facility] ENDOSCOPY;  Service: Cardiovascular;  Laterality: N/A;  . TRICUSPID VALVE REPLACEMENT  02/15/2012   Procedure: TRICUSPID VALVE REPAIR;  Surgeon: Rexene Alberts, MD;  Location: [place];  Service: Open Heart Surgery;  Laterality: N/A;  . TUBAL LIGATION     at time of her c-section     Current Outpatient Medications  Medication Sig Dispense Refill  . amiodarone (PACERONE) 200 MG tablet TAKE 1 TABLET(200 MG) BY MOUTH DAILY 90 tablet 3  . amLODipine (NORVASC) 5 MG tablet Take 1 tablet (5 mg total) by mouth daily. 180 tablet 3  . atorvastatin (LIPITOR) 40 MG tablet Take 1 tablet (40 mg total) by mouth daily. 90 tablet 3  . calcium carbonate (OS-CAL - DOSED IN MG OF ELEMENTAL CALCIUM) 1250 MG tablet Take 1 tablet by mouth daily.      . carvedilol (COREG) 12.5 MG tablet Take 1 tablet (12.5 mg total) by mouth 2 (two) times daily with a meal. 180 tablet 3  . Cholecalciferol (VITAMIN D) 1000 UNITS capsule Take 1,000 Units by mouth daily.     . cyclobenzaprine (FLEXERIL) 10 MG tablet Take 10 mg by mouth daily.     [name] dexamethasone (DECADRON) 4 MG tablet Take 2 tabs at the night before chemotherapy, every 3 weeks, by mouth x 6 cycles, please dispense 12 tabs 60 tablet 0  . insulin glargine (LANTUS SOLOSTAR) 100 UNIT/ML injection Inject 30-35 Units into the skin See admin instructions. Inject 30 units  into the skin in the morning and 35 units at bedtime.    . irbesartan (AVAPRO) 300 MG tablet Take 1 tablet (300 mg total) by mouth daily. 90 tablet 3  . metFORMIN (GLUCOPHAGE) 500 MG tablet Take 500 mg by mouth 2 (two) times daily with a meal.     . methocarbamol (ROBAXIN) 500 MG tablet Take 500 mg by mouth every 8 (eight) hours as needed for muscle spasms.     [name] senna (SENOKOT) 8.6 MG TABS tablet Take 1 tablet (8.6 mg total) by mouth daily as needed for up to 15 doses for mild constipation. 15 tablet 0  . warfarin (COUMADIN) 4 MG tablet Take 1 to 1.5 tablets daily as directed by Coumadin Clinic 135 tablet 1   No current facility-administered medications for this visit.    Allergies:   Chlorhexidine    Social History:  The patient  reports that she has never smoked. She has never used smokeless tobacco. She reports that she does not drink alcohol and does not use drugs.   Family History:  The patient's family history includes Asthma in her mother; Diabetes in her mother; Hypertension in her brother and mother.    ROS:  Please see the history of present illness.   Otherwise, review of systems are positive for difficulty losing weight .   All other systems are reviewed and negative.    PHYSICAL EXAM: VS:  BP (!) 154/72   Pulse 60   Ht 5\' 6"  (1.676 m)   Wt 236 lb 6.4 oz (107.2 kg)   SpO2 96%   BMI 38.16 kg/m  , BMI Body mass index is 38.16 kg/m. GEN: Well nourished, well developed, in no acute distress  HEENT: normal  Neck: no JVD, carotid bruits, or masses Cardiac: RRR, crisp S1 click; 2/6 systolic murmurs, no rubs, or gallops,tr LE edema bilaterally Respiratory:  clear to auscultation bilaterally, normal work of breathing GI: soft, nontender, nondistended, + BS MS: no deformity or atrophy  Skin: warm and dry, venous stasis changes present;  Neuro:  Strength and sensation are intact Psych: euthymic mood, full affect   EKG:   The ekg ordered today demonstrates NSR,  LBBB   Recent Labs: 01/03/2019: Magnesium 1.7 06/20/2019: ALT 13; BUN 19; Creatinine 1.02; Hemoglobin 8.6; Platelet Count 134; Potassium 3.5; Sodium 144   Lipid Panel    Component Value Date/Time   CHOL 152 05/28/2015 0958   TRIG 108 05/28/2015 0958   HDL 35 (L) 05/28/2015 0958   CHOLHDL 4.3 05/28/2015 0958   VLDL 22 05/28/2015 0958   LDLCALC 95 05/28/2015 0958     Other studies Reviewed: Additional studies/ records that were reviewed today with results demonstrating: Labs reviewed.   ASSESSMENT AND PLAN:  1. s/p MVR :  Coumadin for valve.  No symptoms of congestive heart failure.  Needs SBE prophylaxis. 2. PAF: Coumadin for stroke preventon.  No palpitations. 3. Chronic systolic heart failure: She appears euvolemic.  No symptoms of heart failure at this time. EF 35-40% in 2018. 4. HTN: Increase amlodipine to 10 mg daily.  Blood pressure has been quite high in July.  Better today but still above target.  She has f/u with her PMD in a few weeks. 5. Anticoagulated: No bleeding.    Current medicines are reviewed at length with the patient today.  The patient concerns regarding her medicines were addressed.  The following changes have been made:  Increase amlodipine  Labs/ tests ordered today include:  No orders of the defined types were placed in this encounter.   Recommend 150 minutes/week of aerobic exercise Low fat, low carb, high fiber diet recommended  Disposition:   FU in 9 months   Signed, Larae Grooms, MD  09/06/2019 8:58 AM    [facility] [address], [place], [place]  [address] Phone: [phone]; Fax: [phone]

## 2019-09-06 ENCOUNTER — Other Ambulatory Visit: Payer: Self-pay

## 2019-09-06 ENCOUNTER — Encounter: Payer: Self-pay | Admitting: Interventional Cardiology

## 2019-09-06 ENCOUNTER — Ambulatory Visit: Payer: Medicare Other | Admitting: Interventional Cardiology

## 2019-09-06 ENCOUNTER — Ambulatory Visit (INDEPENDENT_AMBULATORY_CARE_PROVIDER_SITE_OTHER): Payer: Medicare Other | Admitting: Pharmacist

## 2019-09-06 VITALS — BP 154/72 | HR 60 | Ht 66.0 in | Wt 236.4 lb

## 2019-09-06 DIAGNOSIS — I10 Essential (primary) hypertension: Secondary | ICD-10-CM

## 2019-09-06 DIAGNOSIS — Z7901 Long term (current) use of anticoagulants: Secondary | ICD-10-CM

## 2019-09-06 DIAGNOSIS — Z952 Presence of prosthetic heart valve: Secondary | ICD-10-CM

## 2019-09-06 DIAGNOSIS — Z5181 Encounter for therapeutic drug level monitoring: Secondary | ICD-10-CM | POA: Diagnosis not present

## 2019-09-06 DIAGNOSIS — I059 Rheumatic mitral valve disease, unspecified: Secondary | ICD-10-CM

## 2019-09-06 DIAGNOSIS — I480 Paroxysmal atrial fibrillation: Secondary | ICD-10-CM

## 2019-09-06 DIAGNOSIS — I4819 Other persistent atrial fibrillation: Secondary | ICD-10-CM

## 2019-09-06 DIAGNOSIS — I519 Heart disease, unspecified: Secondary | ICD-10-CM | POA: Diagnosis not present

## 2019-09-06 LAB — POCT INR: INR: 4.5 — AB (ref 2.0–3.0)

## 2019-09-06 MED ORDER — AMLODIPINE BESYLATE 10 MG PO TABS: 10.0000 mg | ORAL_TABLET | Freq: Every day | ORAL | 3 refills | Status: DC

## 2019-09-06 MED ORDER — ATORVASTATIN CALCIUM 40 MG PO TABS: 40.0000 mg | ORAL_TABLET | Freq: Every day | ORAL | 3 refills | Status: DC

## 2019-09-06 MED ORDER — CARVEDILOL 12.5 MG PO TABS: 12.5000 mg | ORAL_TABLET | Freq: Two times a day (BID) | ORAL | 3 refills | Status: DC

## 2019-09-06 MED ORDER — IRBESARTAN 300 MG PO TABS: 300.0000 mg | ORAL_TABLET | Freq: Every day | ORAL | 3 refills | Status: DC

## 2019-09-06 MED ORDER — AMIODARONE HCL 200 MG PO TABS: ORAL_TABLET | ORAL | 3 refills | Status: DC

## 2019-09-06 NOTE — Patient Instructions (Signed)
Medication Instructions:  Your physician has recommended you make the following change in your medication:   INCREASE: amlodipine to 10 mg once a day  *If you need a refill on your cardiac medications before your next appointment, please call your pharmacy*   Lab Work: None  If you have labs (blood work) drawn today and your tests are completely normal, you will receive your results only by: [name] MyChart Message (if you have MyChart) OR . A paper copy in the mail If you have any lab test that is abnormal or we need to change your treatment, we will call you to review the results.   Testing/Procedures: None   Follow-Up: At [facility], you and your health needs are our priority.  As part of our continuing mission to provide you with exceptional heart care, we have created designated Provider Care Teams.  These Care Teams include your primary Cardiologist (physician) and Advanced Practice Providers (APPs -  Physician Assistants and Nurse Practitioners) who all work together to provide you with the care you need, when you need it.  We recommend signing up for the patient portal called "MyChart".  Sign up information is provided on this After Visit Summary.  MyChart is used to connect with patients for Virtual Visits (Telemedicine).  Patients are able to view lab/test results, encounter notes, upcoming appointments, etc.  Non-urgent messages can be sent to your provider as well.   To learn more about what you can do with MyChart, go to [email or web address].    Your next appointment:   9 month(s)  The format for your next appointment:   In Person  Provider:   You may see Larae Grooms, MD or one of the following Advanced Practice Providers on your designated Care Team:    Melina Copa, PA-C  Ermalinda Barrios, PA-C    Other Instructions None

## 2019-09-06 NOTE — Patient Instructions (Signed)
Hold warfarin today. Eat an extra serving of greens today then continue taking Warfarin 2 tablets daily except for 1.5 tablets on Mondays, Wednesdays, and Fridays. Recheck INR in 2 weeks. Call us with any medication changes or concerns to Coumadin Clinic @ [phone].

## 2019-09-09 ENCOUNTER — Other Ambulatory Visit: Payer: Self-pay | Admitting: Interventional Cardiology

## 2019-09-19 ENCOUNTER — Ambulatory Visit: Payer: Medicare Other | Admitting: *Deleted

## 2019-09-19 ENCOUNTER — Other Ambulatory Visit: Payer: Self-pay

## 2019-09-19 DIAGNOSIS — I059 Rheumatic mitral valve disease, unspecified: Secondary | ICD-10-CM | POA: Diagnosis not present

## 2019-09-19 DIAGNOSIS — Z5181 Encounter for therapeutic drug level monitoring: Secondary | ICD-10-CM

## 2019-09-19 LAB — POCT INR: INR: 8 — AB (ref 2.0–3.0)

## 2019-09-19 LAB — PROTIME-INR
INR: 7.9 (ref 0.9–1.2)
Prothrombin Time: 78 s — ABNORMAL HIGH (ref 9.1–12.0)

## 2019-09-19 NOTE — Patient Instructions (Signed)
Description   Spoke with pt and instructed pt not to take any Warfarin today, No Warfarin tomorrow, No Warfarin Saturday, No Warfarin Sunday then start taking 1.5 tablets daily except 2 tablets Tuesdays and Saturdays. Report to ER with any falls, accidents, bleeding. Recheck INR on Friday 9/17. Call us with any medication changes or concerns to Coumadin Clinic @ [phone].

## 2019-09-27 ENCOUNTER — Ambulatory Visit: Payer: Medicare Other | Admitting: *Deleted

## 2019-09-27 ENCOUNTER — Other Ambulatory Visit: Payer: Self-pay

## 2019-09-27 DIAGNOSIS — I059 Rheumatic mitral valve disease, unspecified: Secondary | ICD-10-CM | POA: Diagnosis not present

## 2019-09-27 DIAGNOSIS — Z5181 Encounter for therapeutic drug level monitoring: Secondary | ICD-10-CM

## 2019-09-27 LAB — POCT INR: INR: 1.9 — AB (ref 2.0–3.0)

## 2019-09-27 NOTE — Patient Instructions (Signed)
Description   Take 2 tablets today and then continue taking 1.5 tablets daily except 2 tablets Tuesdays and Saturdays. Recheck INR in 1 week. Call us with any medication changes or concerns to Coumadin Clinic @ [phone].

## 2019-10-04 ENCOUNTER — Ambulatory Visit: Payer: Medicare Other

## 2019-10-04 ENCOUNTER — Other Ambulatory Visit: Payer: Self-pay

## 2019-10-04 DIAGNOSIS — I059 Rheumatic mitral valve disease, unspecified: Secondary | ICD-10-CM

## 2019-10-04 DIAGNOSIS — Z5181 Encounter for therapeutic drug level monitoring: Secondary | ICD-10-CM | POA: Diagnosis not present

## 2019-10-04 LAB — POCT INR: INR: 3.8 — AB (ref 2.0–3.0)

## 2019-10-04 NOTE — Patient Instructions (Signed)
Description   Skip today's dosage of Warfarin, then start taking 1.5 tablets daily except 2 tablets on Saturdays. Recheck INR in 2 weeks. Call us with any medication changes or concerns to Coumadin Clinic @ [phone].

## 2019-10-08 ENCOUNTER — Other Ambulatory Visit: Payer: Self-pay | Admitting: Physician Assistant

## 2019-10-08 DIAGNOSIS — Z13820 Encounter for screening for osteoporosis: Secondary | ICD-10-CM

## 2019-10-08 DIAGNOSIS — E2839 Other primary ovarian failure: Secondary | ICD-10-CM

## 2019-10-16 NOTE — Progress Notes (Signed)
Radiation Oncology         [phone] ________________________________  Name: [name] MRN: [id number]  Date: 10/17/2019  DOB: 01-14-1955  Follow-Up Visit Note  CC: [name], [name], PA  [name], [name], [name]    ICD-10-CM   1. 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 Since her last visit, she completed adjuvant chemotherapy with Carboplatin and tolerated it well with the exception of drug-induced hyperglycemia and intermittent fluctuation of creatinine levels.  CT scan of chest/abdomen/pelvis on 06/20/2019 showed a relatively similar appearance of the chest since 12/05/2018. There was mosaic attenuation throughout the lungs, which could be related to air trapping or heterogeneous ground-glass. Given cardiomegaly and extensive post-surgical changes of mitral and tricuspid valve repair, ground-glass can be seen with mild pulmonary edema. There was similar thoracic adenopathy that was favored to be reactive or related to a component of CHF. It also showed an increase in size of small left axillary nodes, possibly related to recent COVID-19 vaccine. Additionally, there was a subtle right upper lobe pulmonary nodule that was felt to be similar in size but slightly more distinct in appearance. There was no abdominopelvic adenopathy or evidence of metastatic disease.  She was last seen by Dr. Berline Lopes on 08/02/2019, during which time there was no evidence of disease on examination and she was placed under surveillance.  On review of systems, she reports decreased libido.  I discussed with her this may be multifactorial after going through  chemotherapy surgery and radiation treatments.  Possibly may prove with time. She denies vaginal bleeding, abdominal bloating, pelvic or low back pain.  She is not using her vaginal dilator but reports regular sexual intercourse.  She denies any postcoital vaginal bleeding.  ALLERGIES:  is allergic to chlorhexidine.  Meds: Current Outpatient Medications  Medication Sig Dispense Refill   amiodarone (PACERONE) 200 MG tablet Take one tablet by mouth daily (200 mg) 90 tablet 3   amLODipine (NORVASC) 10 MG tablet Take 1 tablet (10 mg total) by mouth daily. 90 tablet 3   atorvastatin (LIPITOR) 40 MG tablet Take 1 tablet (40 mg total) by mouth daily. 90 tablet 3   calcium carbonate (OS-CAL - DOSED IN MG OF ELEMENTAL CALCIUM) 1250 MG tablet Take 1 tablet by mouth daily.       carvedilol (COREG) 12.5 MG tablet Take 1 tablet (12.5 mg total) by mouth 2 (two) times daily with a meal. 180 tablet 3   Cholecalciferol (VITAMIN D) 1000 UNITS capsule Take 1,000 Units by mouth daily.      cyclobenzaprine (FLEXERIL) 10 MG tablet Take 10 mg by mouth daily.      dexamethasone (DECADRON) 4 MG tablet Take 2 tabs at the night before chemotherapy, every 3 weeks, by mouth x 6 cycles, please dispense 12 tabs 60 tablet 0   insulin glargine (LANTUS SOLOSTAR) 100 UNIT/ML injection Inject 30-35 Units into the skin See admin instructions. Inject 30 units into the skin in the morning and 35 units at bedtime.     irbesartan (AVAPRO) 300 MG tablet Take 1 tablet (300 mg total) by mouth daily. 90 tablet 3   metFORMIN (GLUCOPHAGE) 500 MG tablet Take 500 mg  by mouth 2 (two) times daily with a meal.      methocarbamol (ROBAXIN) 500 MG tablet Take 500 mg by mouth every 8 (eight) hours as needed for muscle spasms.      senna (SENOKOT) 8.6 MG TABS tablet Take 1 tablet (8.6 mg total) by mouth daily as needed for up to 15 doses for mild constipation. 15 tablet 0   warfarin (COUMADIN) 4 MG tablet TAKE 1 AND 1/2 TABLETS TO 2  TABLETS BY MOUTH DAILY AS DIRECTED BY COUMADIN CLINIC 180 tablet 1   No current facility-administered medications for this encounter.    Physical Findings: The patient is in no acute distress. Patient is alert and oriented.  height is 5\' 6"  (1.676 m) and weight is 234 lb 3.2 oz (106.2 kg). Her oral temperature is 97.9 F (36.6 C). Her blood pressure is 139/63 and her pulse is 64. Her respiration is 18 and oxygen saturation is 95%.  Lungs are clear to auscultation bilaterally. Heart has regular rate and rhythm. No palpable cervical, supraclavicular, or axillary adenopathy. Abdomen soft, non-tender, normal bowel sounds. On pelvic examination the external genitalia were unremarkable. A speculum exam was performed. There are no mucosal lesions noted in the vaginal vault. On bimanual   examination there were no pelvic masses appreciated.  Vaginal cuff intact  Lab Findings: Lab Results  Component Value Date   WBC 3.9 (L) 06/20/2019   HGB 8.6 (L) 06/20/2019   HCT 27.6 (L) 06/20/2019   MCV 101.8 (H) 06/20/2019   PLT 134 (L) 06/20/2019    Radiographic Findings: No results found.  Impression: StageIIendometrialcancer, high-grade  No evidence of recurrence on clinical exam today.  Imaging in June showed no areas of concern  Plan: The patient will follow up with Dr. Berline Lopes in three months and with radiation oncology in six months. She is scheduled to undergo a bilateral screening mammogram on 11/13/2019.  Total time spent in this encounter was 25 minutes which included reviewing the patient's most recent chemotherapy, CT scans, follow-ups, physical examination, and documentation.  ____________________________________   Blair Promise, PhD, MD  This document serves as a record of services personally performed by Gery Pray, MD. It was created on his behalf by Clerance Lav, a trained medical scribe. The creation of this record is based on the scribe's personal observations and the  provider's statements to them. This document has been checked and approved by the attending provider.

## 2019-10-17 ENCOUNTER — Encounter: Payer: Self-pay | Admitting: Radiation Oncology

## 2019-10-17 ENCOUNTER — Ambulatory Visit
Admission: RE | Admit: 2019-10-17 | Discharge: 2019-10-17 | Disposition: A | Discharge status: Home / Self Care | Payer: Medicare Other | Source: Ambulatory Visit | Attending: Radiation Oncology | Admitting: Radiation Oncology

## 2019-10-17 ENCOUNTER — Other Ambulatory Visit: Payer: Self-pay

## 2019-10-17 DIAGNOSIS — Z8542 Personal history of malignant neoplasm of other parts of uterus: Secondary | ICD-10-CM | POA: Insufficient documentation

## 2019-10-17 DIAGNOSIS — R911 Solitary pulmonary nodule: Secondary | ICD-10-CM | POA: Insufficient documentation

## 2019-10-17 DIAGNOSIS — Z79899 Other long term (current) drug therapy: Secondary | ICD-10-CM | POA: Diagnosis not present

## 2019-10-17 DIAGNOSIS — R739 Hyperglycemia, unspecified: Secondary | ICD-10-CM | POA: Insufficient documentation

## 2019-10-17 DIAGNOSIS — R6882 Decreased libido: Secondary | ICD-10-CM | POA: Diagnosis not present

## 2019-10-17 DIAGNOSIS — Z794 Long term (current) use of insulin: Secondary | ICD-10-CM | POA: Insufficient documentation

## 2019-10-17 DIAGNOSIS — Z7901 Long term (current) use of anticoagulants: Secondary | ICD-10-CM | POA: Insufficient documentation

## 2019-10-17 DIAGNOSIS — C541 Malignant neoplasm of endometrium: Secondary | ICD-10-CM

## 2019-10-17 NOTE — Progress Notes (Signed)
Patient here for a f/u visit with  Dr. Sondra Come. Patient denies bleeding, pain or bladder/bowel problems.  BP 139/63 (BP Location: Right Arm, Patient Position: Sitting, Cuff Size: Large)   Pulse 64   Temp 97.9 F (36.6 C) (Oral)   Resp 18   Ht 5\' 6"  (1.676 m)   Wt 234 lb 3.2 oz (106.2 kg)   SpO2 95%   BMI 37.80 kg/m   Wt Readings from Last 3 Encounters:  10/17/19 234 lb 3.2 oz (106.2 kg)  09/06/19 236 lb 6.4 oz (107.2 kg)  08/02/19 (!) 231 lb (104.8 kg)

## 2019-10-18 ENCOUNTER — Ambulatory Visit (INDEPENDENT_AMBULATORY_CARE_PROVIDER_SITE_OTHER): Payer: Medicare Other | Admitting: Pharmacist

## 2019-10-18 DIAGNOSIS — Z5181 Encounter for therapeutic drug level monitoring: Secondary | ICD-10-CM

## 2019-10-18 DIAGNOSIS — I059 Rheumatic mitral valve disease, unspecified: Secondary | ICD-10-CM | POA: Diagnosis not present

## 2019-10-18 LAB — PROTIME-INR
INR: 5.8 (ref 0.9–1.2)
Prothrombin Time: 57.3 s — ABNORMAL HIGH (ref 9.1–12.0)

## 2019-10-18 LAB — POCT INR: INR: 6.9 — AB (ref 2.0–3.0)

## 2019-10-18 NOTE — Progress Notes (Signed)
Call husbands phone Delorise Shiner) [phone]

## 2019-10-18 NOTE — Patient Instructions (Addendum)
Description   Sent to lab.  INR 5.8 using lab draw.    Spoke with patient on phone.  Will hold 3 days of warfarin and then reduce dose on Monday and Wednesday to 1 tablet.  Continue 1.5 tablets on Sun/Tues/Thurs/Fri and 2 tablets on Saturday.  Recheck in 2 weeks.

## 2019-10-31 ENCOUNTER — Ambulatory Visit: Payer: Medicare Other

## 2019-10-31 ENCOUNTER — Other Ambulatory Visit: Payer: Self-pay

## 2019-10-31 DIAGNOSIS — I059 Rheumatic mitral valve disease, unspecified: Secondary | ICD-10-CM

## 2019-10-31 DIAGNOSIS — Z5181 Encounter for therapeutic drug level monitoring: Secondary | ICD-10-CM

## 2019-10-31 LAB — POCT INR: INR: 5.4 — AB (ref 2.0–3.0)

## 2019-10-31 NOTE — Patient Instructions (Signed)
Description   Skip today and tomorrow's dosage of Warfarin, then start taking 1 tablet daily except 1.5 tablets on Sundays, Tuesdays and Thursdays.  Recheck in 10 days

## 2019-11-10 ENCOUNTER — Other Ambulatory Visit: Payer: Self-pay | Admitting: Interventional Cardiology

## 2019-11-10 DIAGNOSIS — I10 Essential (primary) hypertension: Secondary | ICD-10-CM

## 2019-11-11 ENCOUNTER — Other Ambulatory Visit: Payer: Self-pay

## 2019-11-11 ENCOUNTER — Ambulatory Visit: Payer: Medicare Other | Admitting: *Deleted

## 2019-11-11 DIAGNOSIS — I059 Rheumatic mitral valve disease, unspecified: Secondary | ICD-10-CM | POA: Diagnosis not present

## 2019-11-11 DIAGNOSIS — Z5181 Encounter for therapeutic drug level monitoring: Secondary | ICD-10-CM

## 2019-11-11 LAB — POCT INR: INR: 1.8 — AB (ref 2.0–3.0)

## 2019-11-11 NOTE — Patient Instructions (Signed)
Description   Today take 1.5 tablets of Warfarin, then continue taking 1 tablet daily except 1.5 tablets on Sundays, Tuesdays and Thursdays.  Recheck in 10 days

## 2019-11-13 ENCOUNTER — Other Ambulatory Visit: Payer: Self-pay

## 2019-11-13 ENCOUNTER — Ambulatory Visit
Admission: RE | Admit: 2019-11-13 | Discharge: 2019-11-13 | Disposition: A | Discharge status: Home / Self Care | Payer: Medicare Other | Source: Ambulatory Visit | Attending: Physician Assistant | Admitting: Physician Assistant

## 2019-11-13 DIAGNOSIS — Z1231 Encounter for screening mammogram for malignant neoplasm of breast: Secondary | ICD-10-CM

## 2019-11-20 ENCOUNTER — Ambulatory Visit (INDEPENDENT_AMBULATORY_CARE_PROVIDER_SITE_OTHER): Payer: Medicare Other | Admitting: *Deleted

## 2019-11-20 ENCOUNTER — Other Ambulatory Visit: Payer: Self-pay

## 2019-11-20 DIAGNOSIS — I059 Rheumatic mitral valve disease, unspecified: Secondary | ICD-10-CM | POA: Diagnosis not present

## 2019-11-20 DIAGNOSIS — Z5181 Encounter for therapeutic drug level monitoring: Secondary | ICD-10-CM

## 2019-11-20 LAB — POCT INR: INR: 2.6 (ref 2.0–3.0)

## 2019-11-20 NOTE — Patient Instructions (Signed)
Description   Continue taking 1 tablet daily except 1.5 tablets on Sundays, Tuesdays and Thursdays.  Recheck in 3 weeks. [facility]madin Clinic [phone]

## 2019-12-11 ENCOUNTER — Ambulatory Visit: Payer: Medicare Other | Admitting: *Deleted

## 2019-12-11 ENCOUNTER — Other Ambulatory Visit: Payer: Self-pay

## 2019-12-11 DIAGNOSIS — Z5181 Encounter for therapeutic drug level monitoring: Secondary | ICD-10-CM

## 2019-12-11 DIAGNOSIS — I059 Rheumatic mitral valve disease, unspecified: Secondary | ICD-10-CM | POA: Diagnosis not present

## 2019-12-11 LAB — POCT INR: INR: 2.2 (ref 2.0–3.0)

## 2019-12-11 NOTE — Patient Instructions (Signed)
Description    Take 1.5 tablets today and then Continue taking 1 tablet daily except 1.5 tablets on Sundays, Tuesdays and Thursdays.  Recheck in 3 weeks. [facility]madin Clinic [phone]

## 2020-01-01 ENCOUNTER — Other Ambulatory Visit: Payer: Self-pay

## 2020-01-01 ENCOUNTER — Ambulatory Visit: Payer: Medicare Other | Admitting: *Deleted

## 2020-01-01 DIAGNOSIS — I059 Rheumatic mitral valve disease, unspecified: Secondary | ICD-10-CM | POA: Diagnosis not present

## 2020-01-01 DIAGNOSIS — Z5181 Encounter for therapeutic drug level monitoring: Secondary | ICD-10-CM | POA: Diagnosis not present

## 2020-01-01 LAB — POCT INR: INR: 2.6 (ref 2.0–3.0)

## 2020-01-01 NOTE — Patient Instructions (Signed)
Description    Continue taking 1 tablet daily except 1.5 tablets on Sundays, Tuesdays and Thursdays.  Recheck in 4 weeks. Coumadin Clinic [phone]

## 2020-01-24 ENCOUNTER — Other Ambulatory Visit: Payer: Medicare Other

## 2020-01-28 ENCOUNTER — Other Ambulatory Visit: Payer: Medicare HMO

## 2020-01-30 ENCOUNTER — Ambulatory Visit (INDEPENDENT_AMBULATORY_CARE_PROVIDER_SITE_OTHER): Payer: Medicare HMO | Admitting: *Deleted

## 2020-01-30 ENCOUNTER — Other Ambulatory Visit: Payer: Self-pay

## 2020-01-30 DIAGNOSIS — I059 Rheumatic mitral valve disease, unspecified: Secondary | ICD-10-CM

## 2020-01-30 DIAGNOSIS — Z5181 Encounter for therapeutic drug level monitoring: Secondary | ICD-10-CM

## 2020-01-30 LAB — POCT INR: INR: 1.8 — AB (ref 2.0–3.0)

## 2020-01-30 NOTE — Patient Instructions (Signed)
Description    Take 2 tablets today and 1.5 tablets tomorrow, then continue taking 1 tablet daily except 1.5 tablets on Sundays, Tuesdays and Thursdays.  Recheck in 2 weeks. Coumadin Clinic [phone]

## 2020-02-17 ENCOUNTER — Other Ambulatory Visit: Payer: Self-pay

## 2020-02-17 ENCOUNTER — Ambulatory Visit (INDEPENDENT_AMBULATORY_CARE_PROVIDER_SITE_OTHER): Payer: Medicare HMO | Admitting: *Deleted

## 2020-02-17 DIAGNOSIS — Z5181 Encounter for therapeutic drug level monitoring: Secondary | ICD-10-CM

## 2020-02-17 DIAGNOSIS — I059 Rheumatic mitral valve disease, unspecified: Secondary | ICD-10-CM

## 2020-02-17 LAB — POCT INR: INR: 2.6 (ref 2.0–3.0)

## 2020-02-17 NOTE — Patient Instructions (Signed)
Description   Continue taking 1 tablet daily except 1.5 tablets on Sundays, Tuesdays and Thursdays.  Recheck in 3 weeks. Coumadin Clinic [phone]    

## 2020-03-09 ENCOUNTER — Other Ambulatory Visit: Payer: Self-pay

## 2020-03-09 ENCOUNTER — Ambulatory Visit (INDEPENDENT_AMBULATORY_CARE_PROVIDER_SITE_OTHER): Payer: Medicare HMO

## 2020-03-09 DIAGNOSIS — I059 Rheumatic mitral valve disease, unspecified: Secondary | ICD-10-CM

## 2020-03-09 DIAGNOSIS — Z5181 Encounter for therapeutic drug level monitoring: Secondary | ICD-10-CM | POA: Diagnosis not present

## 2020-03-09 LAB — POCT INR: INR: 3.1 — AB (ref 2.0–3.0)

## 2020-03-09 NOTE — Patient Instructions (Signed)
-   Continue taking 1 tablet daily except 1.5 tablets on Sundays, Tuesdays and Thursdays.   - Recheck in 4 weeks.  Coumadin Clinic [phone]

## 2020-04-06 ENCOUNTER — Other Ambulatory Visit: Payer: Self-pay

## 2020-04-06 ENCOUNTER — Ambulatory Visit (INDEPENDENT_AMBULATORY_CARE_PROVIDER_SITE_OTHER): Payer: Medicare HMO | Admitting: *Deleted

## 2020-04-06 DIAGNOSIS — Z5181 Encounter for therapeutic drug level monitoring: Secondary | ICD-10-CM | POA: Diagnosis not present

## 2020-04-06 DIAGNOSIS — I059 Rheumatic mitral valve disease, unspecified: Secondary | ICD-10-CM

## 2020-04-06 LAB — POCT INR: INR: 1.6 — AB (ref 2.0–3.0)

## 2020-04-06 NOTE — Patient Instructions (Signed)
Description    Take 1.5 tablets today and 2 tablets tomorrow, then continue to take warfarin 1 tablet daily except for 1.5 tablet on Sunday, Tuesday and Thursday. Recheck INR in 10 days. Call coumadin clinic for any changes in medications or up coming procedures. [phone].

## 2020-04-08 ENCOUNTER — Encounter: Payer: Self-pay | Admitting: Radiation Oncology

## 2020-04-09 ENCOUNTER — Other Ambulatory Visit: Payer: Self-pay

## 2020-04-09 MED ORDER — CARVEDILOL 12.5 MG PO TABS: 12.5000 mg | ORAL_TABLET | Freq: Two times a day (BID) | ORAL | 0 refills | Status: DC

## 2020-04-09 MED ORDER — AMLODIPINE BESYLATE 10 MG PO TABS: 10.0000 mg | ORAL_TABLET | Freq: Every day | ORAL | 0 refills | Status: DC

## 2020-04-10 ENCOUNTER — Other Ambulatory Visit: Payer: Self-pay | Admitting: *Deleted

## 2020-04-10 DIAGNOSIS — I10 Essential (primary) hypertension: Secondary | ICD-10-CM

## 2020-04-10 MED ORDER — IRBESARTAN 300 MG PO TABS: ORAL_TABLET | ORAL | 0 refills | Status: DC

## 2020-04-15 ENCOUNTER — Other Ambulatory Visit: Payer: Self-pay

## 2020-04-15 ENCOUNTER — Ambulatory Visit (INDEPENDENT_AMBULATORY_CARE_PROVIDER_SITE_OTHER): Payer: Medicare HMO | Admitting: *Deleted

## 2020-04-15 DIAGNOSIS — Z5181 Encounter for therapeutic drug level monitoring: Secondary | ICD-10-CM

## 2020-04-15 DIAGNOSIS — I059 Rheumatic mitral valve disease, unspecified: Secondary | ICD-10-CM | POA: Diagnosis not present

## 2020-04-15 LAB — POCT INR: INR: 2.6 (ref 2.0–3.0)

## 2020-04-15 NOTE — Patient Instructions (Signed)
Description   Continue to take warfarin 1 tablet daily except for 1.5 tablet on Sunday, Tuesday and Thursday. Recheck INR in 3 weeks.  Call coumadin clinic for any changes in medications or up coming procedures. [phone].

## 2020-04-16 ENCOUNTER — Ambulatory Visit
Admission: RE | Admit: 2020-04-16 | Discharge: 2020-04-16 | Disposition: A | Discharge status: Home / Self Care | Payer: Medicare HMO | Source: Ambulatory Visit | Attending: Radiation Oncology | Admitting: Radiation Oncology

## 2020-04-16 VITALS — BP 141/69 | HR 61 | Temp 97.8°F | Resp 20 | Ht 66.0 in | Wt 240.8 lb

## 2020-04-16 DIAGNOSIS — Z8542 Personal history of malignant neoplasm of other parts of uterus: Secondary | ICD-10-CM | POA: Insufficient documentation

## 2020-04-16 DIAGNOSIS — Z08 Encounter for follow-up examination after completed treatment for malignant neoplasm: Secondary | ICD-10-CM | POA: Diagnosis not present

## 2020-04-16 DIAGNOSIS — C541 Malignant neoplasm of endometrium: Secondary | ICD-10-CM

## 2020-04-16 DIAGNOSIS — Z923 Personal history of irradiation: Secondary | ICD-10-CM | POA: Insufficient documentation

## 2020-04-16 DIAGNOSIS — Z79899 Other long term (current) drug therapy: Secondary | ICD-10-CM | POA: Diagnosis not present

## 2020-04-16 NOTE — Progress Notes (Signed)
[name] is here today for follow up post radiation to the pelvic. Endometrial   They completed their radiation on:04/15/19  Does the patient complain of any of the following:  . Pain: Patient denies any pain.  . Abdominal bloating: no . Diarrhea/Constipation: No . Nausea/Vomiting: no . Vaginal Discharge: no . Blood in Urine or Stool: no . Urinary Issues (dysuria/incomplete emptying/ incontinence/ increased frequency/urgency): Denies any urinary issues.  . Does patient report using vaginal dilator 2-3 times a week and/or sexually active 2-3 weeks: Patient reports using vaginal dilator 2 times per week.  [name] Post radiation skin changes: no   Additional comments if applicable:  Vitals:   07/46/00 0934  BP: (!) 141/69  Pulse: 61  Resp: 20  Temp: 97.8 F (36.6 C)  SpO2: 97%  Weight: 240 lb 12.8 oz (109.2 kg)  Height: 5\' 6"  (1.676 m)

## 2020-04-16 NOTE — Progress Notes (Signed)
Radiation Oncology         [phone] ________________________________  Name: [name] MRN: [id number]  Date: 04/16/2020  DOB: Mar 17, 1955  Follow-Up Visit Note  CC: [name], [name], PA  [name], [place], [name]    ICD-10-CM   1. Endometrial cancer ([place])  C54.1     Diagnosis: StageIIendometrialcancer, high-grade  Interval Since Last Radiation: One year and two days  Radiation Treatment Dates: 03/18/2019 through 04/15/2019 Site Technique Total Dose (Gy) Dose per Fx (Gy) Completed Fx Beam Energies  Vagina: Pelvis HDR-brachy 30/30 6 5/5 Ir-192    Narrative:  The patient returns today for routine follow-up. Since her last visit, she underwent a bilateral screening mammogram on 11/13/2019 that was negative for malignancy.  On review of systems, she reports . She denies any pelvic pain vaginal bleeding or discharge.  She continues to use her vaginal dilator dilator approximately twice per week..  She denies any abdominal bloating.  She denies any rectal bleeding or hematuria.  ALLERGIES:  is allergic to chlorhexidine.  Meds: Current Outpatient Medications  Medication Sig Dispense Refill  . amiodarone (PACERONE) 200 MG tablet Take one tablet by mouth daily (200 mg) 90 tablet 3  . amLODipine (NORVASC) 10 MG tablet Take 1 tablet (10 mg total) by mouth daily. 90 tablet 0  . atorvastatin (LIPITOR) 40 MG tablet Take 1 tablet (40 mg total) by mouth daily. 90 tablet 3  . calcium carbonate (OS-CAL - DOSED IN MG OF ELEMENTAL CALCIUM) 1250 MG tablet Take 1 tablet by mouth daily.    . carvedilol (COREG) 12.5 MG tablet Take 1 tablet (12.5 mg total) by mouth 2 (two) times daily with a meal. 180 tablet 0  . Cholecalciferol (VITAMIN D) 1000 UNITS capsule Take 1,000 Units by mouth daily.    . cyclobenzaprine (FLEXERIL) 10 MG tablet Take 10 mg by mouth daily.     [name] dexamethasone (DECADRON) 4 MG tablet Take 2 tabs at the night before chemotherapy, every 3 weeks, by mouth x 6 cycles, please dispense 12  tabs 60 tablet 0  . insulin glargine (LANTUS) 100 UNIT/ML injection Inject 30-35 Units into the skin See admin instructions. Inject 30 units into the skin in the morning and 35 units at bedtime.    . irbesartan (AVAPRO) 300 MG tablet TAKE 1 TABLET(300 MG) BY MOUTH DAILY 90 tablet 0  . metFORMIN (GLUCOPHAGE) 500 MG tablet Take 500 mg by mouth 2 (two) times daily with a meal.     . methocarbamol (ROBAXIN) 500 MG tablet Take 500 mg by mouth every 8 (eight) hours as needed for muscle spasms.     [name] senna (SENOKOT) 8.6 MG TABS tablet Take 1 tablet (8.6 mg total) by mouth daily as needed for up to 15 doses for mild constipation. 15 tablet 0  . warfarin (COUMADIN) 4 MG tablet TAKE 1 AND 1/2 TABLETS TO 2 TABLETS BY MOUTH DAILY AS DIRECTED BY COUMADIN CLINIC 180 tablet 1   No current facility-administered medications for this encounter.    Physical Findings: The patient is in no acute distress. Patient is alert and oriented.  height is 5\' 6"  (1.676 m) and weight is 240 lb 12.8 oz (109.2 kg). Her temperature is 97.8 F (36.6 C). Her blood pressure is 141/69 (abnormal) and her pulse is 61. Her respiration is 20 and oxygen saturation is 97%.  Lungs are clear to auscultation bilaterally. Heart has regular rate and rhythm. No palpable cervical, supraclavicular, or axillary adenopathy. Abdomen soft, non-tender, normal bowel sounds.  On pelvic examination the external genitalia were unremarkable. A speculum exam was performed. There are no mucosal lesions noted in the vaginal vault. On bimanual examination amd rectovaginal examination there were no pelvic masses appreciated. Vaginal cuff intact.  Rectal sphincter tone normal.  Lab Findings: Lab Results  Component Value Date   WBC 3.9 (L) 06/20/2019   HGB 8.6 (L) 06/20/2019   HCT 27.6 (L) 06/20/2019   MCV 101.8 (H) 06/20/2019   PLT 134 (L) 06/20/2019    Radiographic Findings: No results found.  Impression: StageIIendometrialcancer, high-grade  No  evidence of recurrence on clinical exam today.   Plan: The patient will follow up with Dr. Berline Lopes in three months and with radiation oncology in six months.  Total time spent in this encounter was 20 minutes which included reviewing the patient's most recent screening mammogram, physical examination, and documentation. ____________________________________   Blair Promise, PhD, MD  This document serves as a record of services personally performed by Gery Pray, MD. It was created on his behalf by Clerance Lav, a trained medical scribe. The creation of this record is based on the scribe's personal observations and the provider's statements to them. This document has been checked and approved by the attending provider.

## 2020-04-20 ENCOUNTER — Other Ambulatory Visit: Payer: Self-pay

## 2020-04-20 MED ORDER — CARVEDILOL 12.5 MG PO TABS: 12.5000 mg | ORAL_TABLET | Freq: Two times a day (BID) | ORAL | 1 refills | Status: DC

## 2020-04-20 MED ORDER — AMLODIPINE BESYLATE 10 MG PO TABS: 10.0000 mg | ORAL_TABLET | Freq: Every day | ORAL | 1 refills | Status: DC

## 2020-05-07 ENCOUNTER — Other Ambulatory Visit: Payer: Self-pay

## 2020-05-07 ENCOUNTER — Ambulatory Visit (INDEPENDENT_AMBULATORY_CARE_PROVIDER_SITE_OTHER): Payer: Medicare HMO | Admitting: *Deleted

## 2020-05-07 DIAGNOSIS — Z5181 Encounter for therapeutic drug level monitoring: Secondary | ICD-10-CM | POA: Diagnosis not present

## 2020-05-07 DIAGNOSIS — I059 Rheumatic mitral valve disease, unspecified: Secondary | ICD-10-CM

## 2020-05-07 LAB — POCT INR: INR: 1.8 — AB (ref 2.0–3.0)

## 2020-05-07 NOTE — Patient Instructions (Signed)
Description   Today take 2 tablets and tomorrow take 1.5 tablets then continue to take warfarin 1 tablet daily except for 1.5 tablet on Sunday, Tuesday and Thursday. Recheck INR in 2 weeks.  Call coumadin clinic for any changes in medications or up coming procedures. [phone].

## 2020-05-18 ENCOUNTER — Ambulatory Visit
Admission: RE | Admit: 2020-05-18 | Discharge: 2020-05-18 | Disposition: A | Discharge status: Home / Self Care | Payer: Medicare HMO | Source: Ambulatory Visit | Attending: Physician Assistant | Admitting: Physician Assistant

## 2020-05-18 ENCOUNTER — Other Ambulatory Visit: Payer: Self-pay

## 2020-05-18 DIAGNOSIS — M8588 Other specified disorders of bone density and structure, other site: Secondary | ICD-10-CM | POA: Diagnosis not present

## 2020-05-18 DIAGNOSIS — Z13820 Encounter for screening for osteoporosis: Secondary | ICD-10-CM

## 2020-05-18 DIAGNOSIS — E2839 Other primary ovarian failure: Secondary | ICD-10-CM

## 2020-05-18 DIAGNOSIS — Z780 Asymptomatic menopausal state: Secondary | ICD-10-CM | POA: Diagnosis not present

## 2020-05-21 ENCOUNTER — Ambulatory Visit (INDEPENDENT_AMBULATORY_CARE_PROVIDER_SITE_OTHER): Payer: Medicare HMO

## 2020-05-21 ENCOUNTER — Other Ambulatory Visit: Payer: Self-pay

## 2020-05-21 DIAGNOSIS — Z5181 Encounter for therapeutic drug level monitoring: Secondary | ICD-10-CM

## 2020-05-21 DIAGNOSIS — I059 Rheumatic mitral valve disease, unspecified: Secondary | ICD-10-CM

## 2020-05-21 LAB — POCT INR: INR: 3.4 — AB (ref 2.0–3.0)

## 2020-05-21 NOTE — Patient Instructions (Addendum)
Description   Continue on same dosage of warfarin 1 tablet daily except for 1.5 tablet on Sundays, Tuesdays and Thursdays. Recheck INR in 4 weeks.  Call coumadin clinic for any changes in medications or up coming procedures. [phone].

## 2020-06-17 NOTE — Progress Notes (Signed)
Cardiology Office Note   Date:  06/19/2020   ID:  [name], [name] May 13, 1955, MRN [id number]  PCP:  Lennie Odor, PA    No chief complaint on file.  S/p MVR  Wt Readings from Last 3 Encounters:  06/19/20 233 lb 3.2 oz (105.8 kg)  04/16/20 240 lb 12.8 oz (109.2 kg)  10/17/19 234 lb 3.2 oz (106.2 kg)       History of Present Illness: JOSSLIN SANJUAN is a 65 y.o. female   who had a mitral valve replacement and TV repair in 2014.  This was complicated by a post op CVA. She has had  atrial flutter. Dr. Rayann Heman recommended amio followed by cardioversion. She had the cardioversion in 07/2012.    2013 cath showed: The left main coronary artery is angiographically normal.   The left anterior descending artery is angiographically normal.  There is a medium sized diagonal which is widely patent.   The left circumflex artery is angiographically normal.  There is a large ramus vessel which is widely patent.  Large OM1 is angiographically normal.   The right coronary artery is a large dominant vessel which is angiographically normal.   In 2021, "She is on disability at this point.  She has some trouble going up stairs, and walking some distance even on flat ground.     Coumadin had been difficult to regulate.    Easier to regulate now, typically 3-5 weeks check.   She was diagnosed with uterine cancer and had surgery. She did well."  She has gained weight.  Not walking as much.  Uterine cancer treatment is finished.   Past Medical History:  Diagnosis Date   Abnormal chest CT    Aortic atherosclerosis (H[facility])    Asthma    Atrial enlargement, left    severe   Atrial fibrillation ([facility]) 01/05/2011   Chronic persistent, failed DCCV    Atrial flutter ([facility])    Bronchospasm 1998   Cardiomegaly    Carotid bruit    Chronic diastolic congestive heart failure ([place])    Diabetes mellitus    insulin dependent   Ejection fraction < 50%    35-40%,    Heart murmur    History of blood  transfusion    History of radiation therapy 03/18/19-04/15/19   endometrial - vaginal brachytherapy -  Dr. Sondra Come    Hypercholesterolemia    Hypertension    Junctional rhythm 09/14/2018   Noted on EKG   Left bundle branch block (LBBB) 09/14/2018   Noted on EKG   LVH (left ventricular hypertrophy) 10/10/2016   Moderate, Noted on ECHO   Mitral regurgitation    Obesity    Obesity (BMI 30-39.9) 01/16/2012   Persistent atrial fibrillation (HCC)    Polyp of rectum    Rheumatic fever 01/16/2012   Reported during childhood   S/P Maze operation for atrial fibrillation 02/15/2012   Complete biatrial lesion set using bipolar radiofrequency and cryothermy ablation with clipping of LA appendage   S/P mitral valve replacement with metallic valve 06/17/1243   25mm Sorin [employer]omedics Optiform mechanical prosthesis   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   90mm Edwards mc3 ring annuloplasty   Shortness of breath    with exertion   Sleep apnea    DOES NOT HAVE CPAP   Stroke ([place]) 2014   Post op , left arm weakness   Tricuspid regurgitation 01/16/2012   Uterine cancer [facility]     Past Surgical History:  Procedure  Laterality Date   CARDIAC CATHETERIZATION  >5 years   CARDIOVASCULAR STRESS TEST  09/2010   CARDIOVERSION  03/25/2011   Procedure: CARDIOVERSION;  Surgeon: Jettie Booze, MD;  Location: [place];  Service: Cardiovascular;  Laterality: N/A;   CARDIOVERSION N/A 07/27/2012   Procedure: CARDIOVERSION;  Surgeon: Jettie Booze, MD;  Location: [place];  Service: Cardiovascular;  Laterality: N/A;   CESAREAN SECTION     COLONOSCOPY WITH PROPOFOL N/A 04/27/2016   Procedure: COLONOSCOPY WITH PROPOFOL;  Surgeon: Wilford Corner, MD;  Location[facility] ENDOSCOPY;  Service: Endoscopy;  Laterality: N/A;   INTRAOPERATIVE TRANSESOPHAGEAL ECHOCARDIOGRAM  02/15/2012   Procedure: INTRAOPERATIVE TRANSESOPHAGEAL ECHOCARDIOGRAM;  Surgeon: Rexene Alberts, MD;  Location: [place];  Service: Open Heart Surgery;   Laterality: N/A;   MAZE  02/15/2012   Procedure: MAZE;  Surgeon: Rexene Alberts, MD;  Location: [place];  Service: Open Heart Surgery;  Laterality: N/A;   MITRAL VALVE REPLACEMENT  02/15/2012   Procedure: MITRAL VALVE (MV) REPLACEMENT;  Surgeon: Rexene Alberts, MD;  Location: [place];  Service: Open Heart Surgery;  Laterality: N/A;   ROBOTIC ASSISTED LAPAROSCOPIC HYSTERECTOMY AND SALPINGECTOMY Bilateral 01/02/2019   Procedure: XI ROBOTIC ASSISTED LAPAROSCOPIC TOTAL HYSTERECTOMY WITH BILATERAL SALPINGOOPHORECTOMY, SENTINEL LYMPH NODE BIOPSY;  Surgeon: [name], MD;  Location: [facility];  Service: Gynecology;  Laterality: Bilateral;   TEE WITHOUT CARDIOVERSION  12/21/2011   Procedure: TRANSESOPHAGEAL ECHOCARDIOGRAM (TEE);  Surgeon: Jettie Booze, MD;  Location: [facility] ENDOSCOPY;  Service: Cardiovascular;  Laterality: N/A;   TRICUSPID VALVE REPLACEMENT  02/15/2012   Procedure: TRICUSPID VALVE REPAIR;  Surgeon: Rexene Alberts, MD;  [facility];  Service: Open Heart Surgery;  Laterality: N/A;   TUBAL LIGATION     at time of her c-section     Current Outpatient Medications  Medication Sig Dispense Refill   amiodarone (PACERONE) 200 MG tablet Take one tablet by mouth daily (200 mg) 90 tablet 3   amLODipine (NORVASC) 10 MG tablet Take 1 tablet (10 mg total) by mouth daily. 90 tablet 1   atorvastatin (LIPITOR) 40 MG tablet Take 1 tablet (40 mg total) by mouth daily. 90 tablet 3   calcium carbonate (OS-CAL - DOSED IN MG OF ELEMENTAL CALCIUM) 1250 MG tablet Take 1 tablet by mouth daily.     carvedilol (COREG) 12.5 MG tablet Take 1 tablet (12.5 mg total) by mouth 2 (two) times daily with a meal. 180 tablet 1   Cholecalciferol (VITAMIN D) 1000 UNITS capsule Take 1,000 Units by mouth daily.     cyclobenzaprine (FLEXERIL) 10 MG tablet Take 10 mg by mouth daily.      dexamethasone (DECADRON) 4 MG tablet Take 2 tabs at the night before chemotherapy, every 3 weeks, by mouth x 6 cycles, please dispense  12 tabs 60 tablet 0   insulin glargine (LANTUS) 100 UNIT/ML injection Inject 30-35 Units into the skin See admin instructions. Inject 30 units into the skin in the morning and 35 units at bedtime.     irbesartan (AVAPRO) 300 MG tablet TAKE 1 TABLET(300 MG) BY MOUTH DAILY 90 tablet 0   metFORMIN (GLUCOPHAGE) 500 MG tablet Take 500 mg by mouth 2 (two) times daily with a meal.      methocarbamol (ROBAXIN) 500 MG tablet Take 500 mg by mouth every 8 (eight) hours as needed for muscle spasms.      senna (SENOKOT) 8.6 MG TABS tablet Take 1 tablet (8.6 mg total) by mouth daily as needed  for up to 15 doses for mild constipation. 15 tablet 0   warfarin (COUMADIN) 4 MG tablet TAKE 1 AND 1/2 TABLETS TO 2 TABLETS BY MOUTH DAILY AS DIRECTED BY COUMADIN CLINIC 180 tablet 1   No current facility-administered medications for this visit.    Allergies:   Chlorhexidine    Social History:  The patient  reports that she has never smoked. She has never used smokeless tobacco. She reports that she does not drink alcohol and does not use drugs.   Family History:  The patient's family history includes Asthma in her mother; Diabetes in her mother; Hypertension in her brother and mother.    ROS:  Please see the history of present illness.   Otherwise, review of systems are positive for back pain.   All other systems are reviewed and negative.    PHYSICAL EXAM: VS:  BP 116/70   Pulse 69   Ht 5\' 6"  (1.676 m)   Wt 233 lb 3.2 oz (105.8 kg)   SpO2 91%   BMI 37.64 kg/m  , BMI Body mass index is 37.64 kg/m. GEN: Well nourished, well developed, in no acute distress HEENT: normal Neck: no JVD, carotid bruits, or masses Cardiac: RRR; crisp S1 Click; 2/6 early systolic murmur, no rubs, or gallops,tr LE edema  Respiratory:  clear to auscultation bilaterally, normal work of breathing GI: soft, nontender, nondistended, + BS MS: no deformity or atrophy Skin: warm and dry, no rash Neuro:  Strength and sensation are  intact Psych: euthymic mood, full affect   EKG:   The ekg ordered today demonstrates NSR, LBBB   Recent Labs: No results found for requested labs within last 8760 hours.   Lipid Panel    Component Value Date/Time   CHOL 152 05/28/2015 0958   TRIG 108 05/28/2015 0958   HDL 35 (L) 05/28/2015 0958   CHOLHDL 4.3 05/28/2015 0958   VLDL 22 05/28/2015 0958   LDLCALC 95 05/28/2015 0958     Other studies Reviewed: Additional studies/ records that were reviewed today with results demonstrating: EF 35-40% in 2018.   ASSESSMENT AND PLAN:  S/p MVR: Continue SBE prophylaxis.  Coumadin for her mitral valve.  No CHF sx.  PAF: Maintaining NSR.  Continue Amio. She needs a TSH to be checked with PMD.  Rx given to take to her next lab appt.  LFTs need to be followed as well. Chronic systolic heart failure: Appears euvolemic. COntinue ARB and beta blocker.  BP has been stable.  Would have to consider Entresto or Verquvo if she has increasing sx. HTN: The current medical regimen is effective;  continue present plan and medications.  Avoid excess salt.  Anticoagulated: No bleeding.  INR today was 3.2.  Coumadin managed by our Coumadin clinic.  We talked about high fiber intake while managing Coumadin.    Current medicines are reviewed at length with the patient today.  The patient concerns regarding her medicines were addressed.  The following changes have been made:  No change  Labs/ tests ordered today include:  No orders of the defined types were placed in this encounter.   Recommend 150 minutes/week of aerobic exercise Low fat, low carb, high fiber diet recommended  Disposition:   FU in 9 months   Signed, Larae Grooms, MD  06/19/2020 8:39 AM    [facility] [address], [place], [place]  [address] Phone: [phone]; Fax: [phone]

## 2020-06-19 ENCOUNTER — Other Ambulatory Visit: Payer: Self-pay

## 2020-06-19 ENCOUNTER — Ambulatory Visit (INDEPENDENT_AMBULATORY_CARE_PROVIDER_SITE_OTHER): Payer: Medicare HMO

## 2020-06-19 ENCOUNTER — Encounter: Payer: Self-pay | Admitting: Interventional Cardiology

## 2020-06-19 ENCOUNTER — Ambulatory Visit (INDEPENDENT_AMBULATORY_CARE_PROVIDER_SITE_OTHER): Payer: Medicare HMO | Admitting: Interventional Cardiology

## 2020-06-19 VITALS — BP 116/70 | HR 69 | Ht 66.0 in | Wt 233.2 lb

## 2020-06-19 DIAGNOSIS — Z7901 Long term (current) use of anticoagulants: Secondary | ICD-10-CM | POA: Diagnosis not present

## 2020-06-19 DIAGNOSIS — Z952 Presence of prosthetic heart valve: Secondary | ICD-10-CM | POA: Diagnosis not present

## 2020-06-19 DIAGNOSIS — I059 Rheumatic mitral valve disease, unspecified: Secondary | ICD-10-CM

## 2020-06-19 DIAGNOSIS — I4819 Other persistent atrial fibrillation: Secondary | ICD-10-CM | POA: Diagnosis not present

## 2020-06-19 DIAGNOSIS — Z5181 Encounter for therapeutic drug level monitoring: Secondary | ICD-10-CM | POA: Diagnosis not present

## 2020-06-19 DIAGNOSIS — I480 Paroxysmal atrial fibrillation: Secondary | ICD-10-CM

## 2020-06-19 DIAGNOSIS — I519 Heart disease, unspecified: Secondary | ICD-10-CM | POA: Diagnosis not present

## 2020-06-19 LAB — POCT INR: INR: 3.2 — AB (ref 2.0–3.0)

## 2020-06-19 NOTE — Patient Instructions (Signed)
Medication Instructions:  Your physician recommends that you continue on your current medications as directed. Please refer to the Current Medication list given to you today.  *If you need a refill on your cardiac medications before your next appointment, please call your pharmacy*   Lab Work: Have TSH checked at primary care.  You have a prescription for this If you have labs (blood work) drawn today and your tests are completely normal, you will receive your results only by: MyChart Message (if you have MyChart) OR A paper copy in the mail If you have any lab test that is abnormal or we need to change your treatment, we will call you to review the results.   Testing/Procedures: none   Follow-Up: At [facility], you and your health needs are our priority.  As part of our continuing mission to provide you with exceptional heart care, we have created designated Provider Care Teams.  These Care Teams include your primary Cardiologist (physician) and Advanced Practice Providers (APPs -  Physician Assistants and Nurse Practitioners) who all work together to provide you with the care you need, when you need it.  We recommend signing up for the patient portal called "MyChart".  Sign up information is provided on this After Visit Summary.  MyChart is used to connect with patients for Virtual Visits (Telemedicine).  Patients are able to view lab/test results, encounter notes, upcoming appointments, etc.  Non-urgent messages can be sent to your provider as well.   To learn more about what you can do with MyChart, go to [email or web address].    Your next appointment:   9 month(s)  The format for your next appointment:   In Person  Provider:   You may see Larae Grooms, MD or one of the following Advanced Practice Providers on your designated Care Team:   Melina Copa, PA-C Ermalinda Barrios, PA-C   Other Instructions

## 2020-06-19 NOTE — Patient Instructions (Signed)
Description   Continue on same dosage of warfarin 1 tablet daily except for 1.5 tablet on Sundays, Tuesdays and Thursdays. Recheck INR in 5 weeks.  Call coumadin clinic for any changes in medications or up coming procedures. [phone].

## 2020-07-31 ENCOUNTER — Ambulatory Visit (INDEPENDENT_AMBULATORY_CARE_PROVIDER_SITE_OTHER): Payer: Medicare HMO

## 2020-07-31 ENCOUNTER — Other Ambulatory Visit: Payer: Self-pay

## 2020-07-31 DIAGNOSIS — I059 Rheumatic mitral valve disease, unspecified: Secondary | ICD-10-CM | POA: Diagnosis not present

## 2020-07-31 DIAGNOSIS — Z5181 Encounter for therapeutic drug level monitoring: Secondary | ICD-10-CM

## 2020-07-31 LAB — POCT INR: INR: 3.1 — AB (ref 2.0–3.0)

## 2020-07-31 NOTE — Patient Instructions (Signed)
Description   Continue on same dosage of warfarin 1 tablet daily except for 1.5 tablets on Sundays, Tuesdays and Thursdays. Recheck INR in 6 weeks.  Call coumadin clinic for any changes in medications or up coming procedures. [phone].

## 2020-09-03 DIAGNOSIS — E1169 Type 2 diabetes mellitus with other specified complication: Secondary | ICD-10-CM | POA: Diagnosis not present

## 2020-09-03 DIAGNOSIS — E119 Type 2 diabetes mellitus without complications: Secondary | ICD-10-CM | POA: Diagnosis not present

## 2020-09-03 DIAGNOSIS — I700 Atherosclerosis of aorta: Secondary | ICD-10-CM | POA: Diagnosis not present

## 2020-09-03 DIAGNOSIS — E7800 Pure hypercholesterolemia, unspecified: Secondary | ICD-10-CM | POA: Diagnosis not present

## 2020-09-03 DIAGNOSIS — D6869 Other thrombophilia: Secondary | ICD-10-CM | POA: Diagnosis not present

## 2020-09-03 DIAGNOSIS — I4819 Other persistent atrial fibrillation: Secondary | ICD-10-CM | POA: Diagnosis not present

## 2020-09-03 DIAGNOSIS — I110 Hypertensive heart disease with heart failure: Secondary | ICD-10-CM | POA: Diagnosis not present

## 2020-09-03 DIAGNOSIS — Z794 Long term (current) use of insulin: Secondary | ICD-10-CM | POA: Diagnosis not present

## 2020-09-11 ENCOUNTER — Other Ambulatory Visit: Payer: Self-pay

## 2020-09-11 ENCOUNTER — Ambulatory Visit (INDEPENDENT_AMBULATORY_CARE_PROVIDER_SITE_OTHER): Payer: Medicare HMO | Admitting: *Deleted

## 2020-09-11 DIAGNOSIS — I059 Rheumatic mitral valve disease, unspecified: Secondary | ICD-10-CM | POA: Diagnosis not present

## 2020-09-11 DIAGNOSIS — Z5181 Encounter for therapeutic drug level monitoring: Secondary | ICD-10-CM

## 2020-09-11 LAB — POCT INR: INR: 3 (ref 2.0–3.0)

## 2020-09-11 NOTE — Patient Instructions (Signed)
Description   Continue taking warfarin 1 tablet daily except for 1.5 tablets on Sundays, Tuesdays and Thursdays. Recheck INR in 6 weeks.  Call coumadin clinic for any changes in medications or up coming procedures. [phone].

## 2020-09-24 ENCOUNTER — Other Ambulatory Visit: Payer: Self-pay | Admitting: Interventional Cardiology

## 2020-09-24 DIAGNOSIS — I10 Essential (primary) hypertension: Secondary | ICD-10-CM

## 2020-10-09 ENCOUNTER — Other Ambulatory Visit: Payer: Self-pay | Admitting: Physician Assistant

## 2020-10-09 DIAGNOSIS — Z1231 Encounter for screening mammogram for malignant neoplasm of breast: Secondary | ICD-10-CM

## 2020-10-14 NOTE — Progress Notes (Signed)
Radiation Oncology         ([phone]) [phone] ________________________________  Name: [name] MRN: [id number]  Date: 10/15/2020  DOB: 1955-06-15  Follow-Up Visit Note  CC: [name], [name], PA  [name], [place], [name]    ICD-10-CM   1. Endometrial cancer ([facility])  C54.1       Diagnosis: Stage II endometrial cancer, high-grade  Interval Since Last Radiation:  1 year 6 months and 1 day   Radiation Treatment Dates: 03/18/2019 through 04/15/2019 Site Technique Total Dose (Gy) Dose per Fx (Gy) Completed Fx Beam Energies  Vagina: Pelvis HDR-brachy 30/30 6 5/5 Ir-192    Narrative:  The patient returns today for routine follow-up. Since her last visit, the patient received a DXA for bone density of L2-L4 on 05/18/20 which revealed a T score of -1.3. This finding classifies the patient as osteopenic/low bone mass according to [facility]) criteria.   The patient did not follow-up with Dr. Berline Lopes recently.  She does not wish to consider using the vaginal dilator.  sHe denies any abdominal bloating pelvic pain or vaginal bleeding.  She denies any hematuria or rectal bleeding.     Allergies:  is allergic to chlorhexidine.  Meds: Current Outpatient Medications  Medication Sig Dispense Refill   amiodarone (PACERONE) 200 MG tablet Take one tablet by mouth daily (200 mg) 90 tablet 3   amLODipine (NORVASC) 10 MG tablet Take 1 tablet (10 mg total) by mouth daily. 90 tablet 1   atorvastatin (LIPITOR) 40 MG tablet Take 1 tablet (40 mg total) by mouth daily. 90 tablet 3   calcium carbonate (OS-CAL - DOSED IN MG OF ELEMENTAL CALCIUM) 1250 MG tablet Take 1 tablet by mouth daily.     carvedilol (COREG) 12.5 MG tablet Take 1 tablet (12.5 mg total) by mouth 2 (two) times daily with a meal. 180 tablet 1   Cholecalciferol (VITAMIN D) 1000 UNITS capsule Take 1,000 Units by mouth daily.     cyclobenzaprine (FLEXERIL) 10 MG tablet Take 10 mg by mouth daily.      dexamethasone (DECADRON) 4 MG  tablet Take 2 tabs at the night before chemotherapy, every 3 weeks, by mouth x 6 cycles, please dispense 12 tabs 60 tablet 0   insulin glargine (LANTUS) 100 UNIT/ML injection Inject 30-35 Units into the skin See admin instructions. Inject 30 units into the skin in the morning and 35 units at bedtime.     irbesartan (AVAPRO) 300 MG tablet TAKE 1 TABLET EVERY DAY 90 tablet 2   metFORMIN (GLUCOPHAGE) 500 MG tablet Take 500 mg by mouth 2 (two) times daily with a meal.      methocarbamol (ROBAXIN) 500 MG tablet Take 500 mg by mouth every 8 (eight) hours as needed for muscle spasms.      senna (SENOKOT) 8.6 MG TABS tablet Take 1 tablet (8.6 mg total) by mouth daily as needed for up to 15 doses for mild constipation. 15 tablet 0   warfarin (COUMADIN) 4 MG tablet TAKE 1 AND 1/2 TABLETS TO 2 TABLETS BY MOUTH DAILY AS DIRECTED BY COUMADIN CLINIC 180 tablet 1   No current facility-administered medications for this encounter.    Physical Findings: The patient is in no acute distress. Patient is alert and oriented.  weight is 233 lb (105.7 kg). Her temperature is 97 F (36.1 C) (abnormal). Her blood pressure is 142/77 (abnormal) and her pulse is 74. Her respiration is 18 and oxygen saturation is 98%. .  No significant changes.  Lungs are clear to auscultation bilaterally. Heart has regular rate and rhythm. No palpable cervical, supraclavicular, or axillary adenopathy. Abdomen soft, non-tender, normal bowel sounds.  On pelvic examination the external genitalia are unremarkable.  A speculum exam is formed.  There are no mucosal lesions noted in the vaginal vault.  Good view of the vaginal cuff was noted without any mucosal lesions.  On bimanual and rectovaginal examination there are no pelvic masses appreciated.   Lab Findings: Lab Results  Component Value Date   WBC 3.9 (L) 06/20/2019   HGB 8.6 (L) 06/20/2019   HCT 27.6 (L) 06/20/2019   MCV 101.8 (H) 06/20/2019   PLT 134 (L) 06/20/2019    Radiographic  Findings: No results found.  Impression:  Stage II endometrial cancer, high-grade  No evidence of recurrence on clinical exam today.  Patient does not appear to have any lasting effects from her chemotherapy or vaginal brachytherapy treatments.  Plan: She will follow-up with Dr. Berline Lopes in 3 months.  Routine follow-up in radiation oncology in 6 months.   20 minutes of total time was spent for this patient encounter, including preparation, face-to-face counseling with the patient and coordination of care, physical exam, and documentation of the encounter. ____________________________________  Blair Promise, PhD, MD   This document serves as a record of services personally performed by Gery Pray, MD. It was created on his behalf by Roney Mans, a trained medical scribe. The creation of this record is based on the scribe's personal observations and the provider's statements to them. This document has been checked and approved by the attending provider.

## 2020-10-15 ENCOUNTER — Other Ambulatory Visit: Payer: Self-pay

## 2020-10-15 ENCOUNTER — Ambulatory Visit
Admission: RE | Admit: 2020-10-15 | Discharge: 2020-10-15 | Disposition: A | Discharge status: Home / Self Care | Payer: Medicare HMO | Source: Ambulatory Visit | Attending: Radiation Oncology | Admitting: Radiation Oncology

## 2020-10-15 ENCOUNTER — Encounter: Payer: Self-pay | Admitting: Radiation Oncology

## 2020-10-15 VITALS — BP 142/77 | HR 74 | Temp 97.0°F | Resp 18 | Wt 233.0 lb

## 2020-10-15 DIAGNOSIS — Z923 Personal history of irradiation: Secondary | ICD-10-CM | POA: Diagnosis not present

## 2020-10-15 DIAGNOSIS — Z79899 Other long term (current) drug therapy: Secondary | ICD-10-CM | POA: Insufficient documentation

## 2020-10-15 DIAGNOSIS — Z08 Encounter for follow-up examination after completed treatment for malignant neoplasm: Secondary | ICD-10-CM | POA: Diagnosis not present

## 2020-10-15 DIAGNOSIS — C541 Malignant neoplasm of endometrium: Secondary | ICD-10-CM

## 2020-10-15 DIAGNOSIS — Z8542 Personal history of malignant neoplasm of other parts of uterus: Secondary | ICD-10-CM | POA: Diagnosis not present

## 2020-10-15 DIAGNOSIS — M8580 Other specified disorders of bone density and structure, unspecified site: Secondary | ICD-10-CM | POA: Insufficient documentation

## 2020-10-15 NOTE — Progress Notes (Signed)
[name] is here today for follow up post radiation to the pelvic.  They completed their radiation on: 04/15/19  Does the patient complain of any of the following:  Pain:Patient denies pain. Abdominal bloating: no Diarrhea/Constipation: no Nausea/Vomiting: no Vaginal Discharge: no Blood in Urine or Stool: no Urinary Issues (dysuria/incomplete emptying/ incontinence/ increased frequency/urgency): no Does patient report using vaginal dilator 2-3 times a week and/or sexually active 2-3 weeks: Patient not using vaginal dilator, or being sexually active.  Post radiation skin changes: no  Additional comments if applicable: Vitals:   80/22/33 0937  BP: (!) 142/77  Pulse: 74  Resp: 18  Temp: (!) 97 F (36.1 C)  SpO2: 98%  Weight: 233 lb (105.7 kg)

## 2020-10-23 ENCOUNTER — Ambulatory Visit (INDEPENDENT_AMBULATORY_CARE_PROVIDER_SITE_OTHER): Payer: Medicare HMO | Admitting: *Deleted

## 2020-10-23 ENCOUNTER — Other Ambulatory Visit: Payer: Self-pay

## 2020-10-23 DIAGNOSIS — I059 Rheumatic mitral valve disease, unspecified: Secondary | ICD-10-CM

## 2020-10-23 DIAGNOSIS — Z5181 Encounter for therapeutic drug level monitoring: Secondary | ICD-10-CM

## 2020-10-23 LAB — POCT INR: INR: 2.2 (ref 2.0–3.0)

## 2020-10-23 NOTE — Patient Instructions (Signed)
Description   Today take 1.5 tablets then continue taking warfarin 1 tablet daily except for 1.5 tablets on Sundays, Tuesdays and Thursdays. Recheck INR in 5 weeks.  Call coumadin clinic for any changes in medications or up coming procedures. [phone].

## 2020-11-13 ENCOUNTER — Other Ambulatory Visit: Payer: Self-pay

## 2020-11-13 ENCOUNTER — Ambulatory Visit
Admission: RE | Admit: 2020-11-13 | Discharge: 2020-11-13 | Disposition: A | Discharge status: Home / Self Care | Payer: Medicare HMO | Source: Ambulatory Visit | Attending: Physician Assistant | Admitting: Physician Assistant

## 2020-11-13 DIAGNOSIS — Z1231 Encounter for screening mammogram for malignant neoplasm of breast: Secondary | ICD-10-CM | POA: Diagnosis not present

## 2020-11-24 ENCOUNTER — Other Ambulatory Visit: Payer: Self-pay | Admitting: Interventional Cardiology

## 2020-11-24 DIAGNOSIS — I10 Essential (primary) hypertension: Secondary | ICD-10-CM

## 2020-11-24 DIAGNOSIS — I4819 Other persistent atrial fibrillation: Secondary | ICD-10-CM

## 2020-11-27 ENCOUNTER — Other Ambulatory Visit: Payer: Self-pay

## 2020-11-27 ENCOUNTER — Ambulatory Visit (INDEPENDENT_AMBULATORY_CARE_PROVIDER_SITE_OTHER): Payer: Medicare HMO

## 2020-11-27 DIAGNOSIS — Z954 Presence of other heart-valve replacement: Secondary | ICD-10-CM

## 2020-11-27 DIAGNOSIS — Z5181 Encounter for therapeutic drug level monitoring: Secondary | ICD-10-CM | POA: Diagnosis not present

## 2020-11-27 DIAGNOSIS — I480 Paroxysmal atrial fibrillation: Secondary | ICD-10-CM | POA: Diagnosis not present

## 2020-11-27 LAB — POCT INR: INR: 1.4 — AB (ref 2.0–3.0)

## 2020-11-27 NOTE — Patient Instructions (Signed)
-   take 2 tablets warfarin tonight, 1.5 tablets tomorrow, then  - continue taking warfarin 1 tablet daily except for 1.5 tablets on Sundays, Tuesdays and Thursdays.  - Recheck INR in 4 weeks.   Call coumadin clinic for any changes in medications or up coming procedures. [phone].

## 2020-12-07 ENCOUNTER — Other Ambulatory Visit: Payer: Self-pay | Admitting: Interventional Cardiology

## 2020-12-08 DIAGNOSIS — G620 Drug-induced polyneuropathy: Secondary | ICD-10-CM | POA: Diagnosis not present

## 2020-12-08 DIAGNOSIS — M810 Age-related osteoporosis without current pathological fracture: Secondary | ICD-10-CM | POA: Diagnosis not present

## 2020-12-08 DIAGNOSIS — I4891 Unspecified atrial fibrillation: Secondary | ICD-10-CM | POA: Diagnosis not present

## 2020-12-08 DIAGNOSIS — Z0000 Encounter for general adult medical examination without abnormal findings: Secondary | ICD-10-CM | POA: Diagnosis not present

## 2020-12-25 ENCOUNTER — Other Ambulatory Visit: Payer: Self-pay

## 2020-12-25 ENCOUNTER — Ambulatory Visit (INDEPENDENT_AMBULATORY_CARE_PROVIDER_SITE_OTHER): Payer: Medicare HMO

## 2020-12-25 DIAGNOSIS — Z5181 Encounter for therapeutic drug level monitoring: Secondary | ICD-10-CM | POA: Diagnosis not present

## 2020-12-25 DIAGNOSIS — Z954 Presence of other heart-valve replacement: Secondary | ICD-10-CM | POA: Diagnosis not present

## 2020-12-25 DIAGNOSIS — I480 Paroxysmal atrial fibrillation: Secondary | ICD-10-CM

## 2020-12-25 LAB — POCT INR: INR: 2.3 (ref 2.0–3.0)

## 2020-12-25 NOTE — Patient Instructions (Addendum)
Description   - Take 1.5 tablets today and then START taking warfarin 1 tablet daily except for 1.5 tablets on Sundays, Tuesdays, Thursdays, and Saturdays. - Recheck INR in 4 weeks.   Call coumadin clinic for any changes in medications or up coming procedures. [phone].

## 2021-01-14 ENCOUNTER — Telehealth: Payer: Self-pay | Admitting: *Deleted

## 2021-01-14 NOTE — Telephone Encounter (Signed)
CALLED PATIENT TO INFORM OF FU APPT. WITH DR. Berline Lopes ON 02-04-21 - ARRIVAL TIME- 2 PM, SPOKE WITH PATIENT'S DAUGHTER- TOMEKA AND SHE IS AWARE OF THIS APPT.

## 2021-01-22 ENCOUNTER — Other Ambulatory Visit: Payer: Self-pay

## 2021-01-22 ENCOUNTER — Ambulatory Visit (INDEPENDENT_AMBULATORY_CARE_PROVIDER_SITE_OTHER): Payer: Medicare HMO

## 2021-01-22 DIAGNOSIS — Z954 Presence of other heart-valve replacement: Secondary | ICD-10-CM

## 2021-01-22 DIAGNOSIS — Z5181 Encounter for therapeutic drug level monitoring: Secondary | ICD-10-CM | POA: Diagnosis not present

## 2021-01-22 DIAGNOSIS — I480 Paroxysmal atrial fibrillation: Secondary | ICD-10-CM | POA: Diagnosis not present

## 2021-01-22 LAB — POCT INR: INR: 2.2 (ref 2.0–3.0)

## 2021-01-22 NOTE — Patient Instructions (Signed)
Description   - Take 1.5 tablets today and then START taking warfarin 1.5 tablets daily except for 1 tablets on Mondays and Fridays  - Recheck INR in 2 weeks.   Call coumadin clinic for any changes in medications or up coming procedures. [phone].

## 2021-02-04 ENCOUNTER — Encounter: Payer: Self-pay | Admitting: Gynecologic Oncology

## 2021-02-04 ENCOUNTER — Other Ambulatory Visit: Payer: Self-pay

## 2021-02-04 ENCOUNTER — Inpatient Hospital Stay: Payer: Medicare HMO | Attending: Gynecologic Oncology | Admitting: Gynecologic Oncology

## 2021-02-04 VITALS — BP 142/67 | HR 85 | Temp 98.0°F | Resp 16 | Wt 231.5 lb

## 2021-02-04 DIAGNOSIS — I340 Nonrheumatic mitral (valve) insufficiency: Secondary | ICD-10-CM | POA: Insufficient documentation

## 2021-02-04 DIAGNOSIS — I110 Hypertensive heart disease with heart failure: Secondary | ICD-10-CM | POA: Insufficient documentation

## 2021-02-04 DIAGNOSIS — Z9221 Personal history of antineoplastic chemotherapy: Secondary | ICD-10-CM | POA: Insufficient documentation

## 2021-02-04 DIAGNOSIS — Z6837 Body mass index (BMI) 37.0-37.9, adult: Secondary | ICD-10-CM | POA: Diagnosis not present

## 2021-02-04 DIAGNOSIS — Z952 Presence of prosthetic heart valve: Secondary | ICD-10-CM | POA: Diagnosis not present

## 2021-02-04 DIAGNOSIS — Z7952 Long term (current) use of systemic steroids: Secondary | ICD-10-CM | POA: Diagnosis not present

## 2021-02-04 DIAGNOSIS — F319 Bipolar disorder, unspecified: Secondary | ICD-10-CM | POA: Insufficient documentation

## 2021-02-04 DIAGNOSIS — Z923 Personal history of irradiation: Secondary | ICD-10-CM | POA: Diagnosis not present

## 2021-02-04 DIAGNOSIS — Z7984 Long term (current) use of oral hypoglycemic drugs: Secondary | ICD-10-CM | POA: Insufficient documentation

## 2021-02-04 DIAGNOSIS — I700 Atherosclerosis of aorta: Secondary | ICD-10-CM | POA: Insufficient documentation

## 2021-02-04 DIAGNOSIS — E7800 Pure hypercholesterolemia, unspecified: Secondary | ICD-10-CM | POA: Diagnosis not present

## 2021-02-04 DIAGNOSIS — Z90710 Acquired absence of both cervix and uterus: Secondary | ICD-10-CM | POA: Insufficient documentation

## 2021-02-04 DIAGNOSIS — Z794 Long term (current) use of insulin: Secondary | ICD-10-CM | POA: Diagnosis not present

## 2021-02-04 DIAGNOSIS — Z79899 Other long term (current) drug therapy: Secondary | ICD-10-CM | POA: Diagnosis not present

## 2021-02-04 DIAGNOSIS — Z8542 Personal history of malignant neoplasm of other parts of uterus: Secondary | ICD-10-CM | POA: Diagnosis not present

## 2021-02-04 DIAGNOSIS — I447 Left bundle-branch block, unspecified: Secondary | ICD-10-CM | POA: Diagnosis not present

## 2021-02-04 DIAGNOSIS — I4819 Other persistent atrial fibrillation: Secondary | ICD-10-CM | POA: Diagnosis not present

## 2021-02-04 DIAGNOSIS — E119 Type 2 diabetes mellitus without complications: Secondary | ICD-10-CM | POA: Insufficient documentation

## 2021-02-04 DIAGNOSIS — C541 Malignant neoplasm of endometrium: Secondary | ICD-10-CM

## 2021-02-04 DIAGNOSIS — Z90722 Acquired absence of ovaries, bilateral: Secondary | ICD-10-CM | POA: Diagnosis not present

## 2021-02-04 DIAGNOSIS — I5032 Chronic diastolic (congestive) heart failure: Secondary | ICD-10-CM | POA: Diagnosis not present

## 2021-02-04 DIAGNOSIS — E669 Obesity, unspecified: Secondary | ICD-10-CM | POA: Insufficient documentation

## 2021-02-04 DIAGNOSIS — Z8673 Personal history of transient ischemic attack (TIA), and cerebral infarction without residual deficits: Secondary | ICD-10-CM | POA: Insufficient documentation

## 2021-02-04 DIAGNOSIS — G4730 Sleep apnea, unspecified: Secondary | ICD-10-CM | POA: Insufficient documentation

## 2021-02-04 NOTE — Progress Notes (Signed)
Gynecologic Oncology Return Clinic Visit  02/04/2021  Reason for Visit: Follow-up in the setting of high risk uterine cancer  Treatment History: Oncology History Overview Note  Clear cell features MSI stable   Endometrial cancer ([place])  09/24/2018 Initial Diagnosis   She presented with post menopausal bleeding   11/20/2018 Initial Biopsy   EMB 11/10: G3 carcinoma with clear cell features.  Pap 11/10: AGC    12/03/2018 Surgery   TRH/BSO, bil SLNs   12/05/2018 Imaging   CT C/A/P:  1. Low attenuation mass or fluid in the endometrial cavity (series 2, image 110), in keeping with known endometrial malignancy. 2. Numerous small bilateral ground-glass pulmonary nodules. Although nonspecific these are likely infectious or inflammatory given appearance, isolated manifestation of metastatic disease much less favored. 3. Enlarged mediastinal and hilar lymph nodes, likely reactive given pulmonary findings. 4. No evidence of metastatic disease in the abdomen or pelvis. 5. Nonobstructive right nephrolithiasis. 6. Aortic Atherosclerosis (ICD10-I70.0).   01/02/2019 Pathologic Stage   Stage II Gr3 (clear cell features), no LVSI, cervial stroma involved, <50% MI MSI-low  A. 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            Tumor limited to upper half of myometrium.            Leiomyoma.            See oncology table.       Serosa: Fibrous adhesions with endosalpingiosis. No malignancy.  - Cervix: Stroma involved by tumor.  - Bilateral ovaries: Inclusion cysts. No malignancy.  - Bilateral fallopian tubes: Unremarkable. No malignancy.   B. LYMPH NODE, RIGHT OBTURATOR, SENTINEL, BIOPSY:  - One of one lymph nodes negative for carcinoma (0/1).   C. LYMPH NODE, LEFT EXTERNAL ILIAC, SENTINEL, BIOPSY:  - One of one lymph nodes negative for carcinoma  (0/1).   D. LYMPH NODE, LEFT EXTERNAL ILIAC, BIOPSY:  - One of one lymph nodes negative for carcinoma (0/1).   ONCOLOGY TABLE:   UTERUS, CARCINOMA OR CARCINOSARCOMA   Procedure: Total hysterectomy and bilateral salpingo-oophorectomy  Histologic type: Poorly differentiated carcinoma with clear cell  features, see comment.  Histologic Grade: High-grade  Myometrial invasion:       Depth of invasion:9 mm       Myometrial thickness: 20 mm  Uterine Serosa Involvement: Not identified  Cervical stromal involvement: Present  Extent of involvement of other organs: Not identified  Lymphovascular invasion: Not identified  Regional Lymph Nodes:       Examined:     3 Sentinel                               0 non-sentinel                               3 total        Lymph nodes with metastasis: 0        Isolated tumor cells (<0.2 mm): 0        Micrometastasis:  (>0.2 mm and < 2.0 mm): 0        Macrometastasis: (>2.0 mm): 0        Extracapsular extension: Not applicable  Representative Tumor Block: A5  MMR / MSI testing: Will be ordered  Pathologic  Stage Classification (pTNM, AJCC 8th edition):  pT2, pNX  Comments: The tumor consists of solid sheets and more tubulocystic areas with focal areas of cytoplasmic clearing. Immunohistochemistry is positive for racemase (weak), Napsin-A (focal), and p53 (wild type). ER and PR are negative. While extensive clear cell changes are not seen in the more solid components, the immunoprofile along with more definitive areas of clear cell changes favor the overall tumor to be a clear cell carcinoma. The tumor has a more pushing type of invasion. There is stromal invasion in areas of endocervical mucosa. Immunohistochemistry on the serosa adhesions reveals mesothelial cells (calretinin) and a small focus of endosalpingiosis (PAX-8, ER, MOC31). Pancytokeratin on the lymph nodes is negative.    01/18/2019 Cancer Staging   Staging form: Corpus Uteri - Carcinoma and  Carcinosarcoma, AJCC 8th Edition - Pathologic: Stage II (pT2, pN0, cM0) - Signed by Heath Lark, MD on 01/18/2019    02/04/2019 - 05/21/2019 Chemotherapy   The patient had carboplatin and taxol   03/18/2019 - 04/15/2019 Radiation Therapy    Vagina: Pelvis HDR-brachy 30/30 6 5/5 Ir-192      06/20/2019 Imaging   IMPRESSION: 1. Interval hysterectomy. No abdominopelvic adenopathy or evidence of metastatic disease. 2. Relatively similar appearance of the chest since 12/05/2018. Mosaic attenuation throughout the lungs which could be related to air trapping or heterogeneous ground-glass. Given cardiomegaly and extensive postsurgical changes of mitral and tricuspid valve repair, ground-glass as can be seen with mild pulmonary edema is considered. 3. Similar thoracic adenopathy, favored to be reactive or related to a component of congestive heart failure. 4. Increased size of small left axillary nodes, possibly related to recent COVID-19 vaccine. Correlate with clinical history. 5. A subtle right upper lobe pulmonary nodule is felt to be similar in size but slightly more distinct in appearance today. Recommend attention on follow-up. 6. Right nephrolithiasis. 7. Pulmonary artery enlargement suggests pulmonary arterial hypertension. 8. Cholelithiasis.     Interval History: She last saw Dr. Sondra Come in 10/2020.  Patient reports doing well.  She denies any vaginal bleeding or discharge.  Denies any pelvic or abdominal pain.  Reports regular bowel and bladder function.  Endorses a good appetite without nausea or emesis.  Had a mammogram in November of last year.  Past Medical/Surgical History: Past Medical History:  Diagnosis Date   Abnormal chest CT    Aortic atherosclerosis (HCC)    Asthma    Atrial enlargement, left    severe   Atrial fibrillation (HCC) 01/05/2011   Chronic persistent, failed DCCV    Atrial flutter (HCC)    Bronchospasm 1998   Cardiomegaly    Carotid bruit    Chronic  diastolic congestive heart failure ([place])    Diabetes mellitus    insulin dependent   Ejection fraction < 50%    35-40%,    Heart murmur    History of blood transfusion    History of radiation therapy 03/18/19-04/15/19   endometrial - vaginal brachytherapy -  Dr. Sondra Come    Hypercholesterolemia    Hypertension    Junctional rhythm 09/14/2018   Noted on EKG   Left bundle branch block (LBBB) 09/14/2018   Noted on EKG   LVH (left ventricular hypertrophy) 10/10/2016   Moderate, Noted on ECHO   Mitral regurgitation    Obesity    Obesity (BMI 30-39.9) 01/16/2012   Persistent atrial fibrillation (HCC)    Polyp of rectum    Rheumatic fever 01/16/2012   Reported during childhood   S/P  Maze operation for atrial fibrillation 02/15/2012   Complete biatrial lesion set using bipolar radiofrequency and cryothermy ablation with clipping of LA appendage   S/P mitral valve replacement with metallic valve 0/01/3141   4m Sorin [employer]omed[employer] Optiform mechanical prosthesis   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   210mEdwards mc3 ring annuloplasty   Shortness of breath    with exertion   Sleep apnea    DOES NOT HAVE CPAP   [place]   Post op , left arm weakness   Tricuspid regurgitation 01/16/2012   [facility]    Past Surgical History:  Procedure Laterality Date   CARDIAC CATHETERIZATION  >5 years   CARDIOVASCULAR STRESS TEST  09/2010   CARDIOVERSION  03/25/2011   Procedure: CARDIOVERSION;  Surgeon: JaJettie BoozeMD;  [place] Service: Cardiovascular;  Laterality: N/A;   CARDIOVERSION N/A 07/27/2012   Procedure: CARDIOVERSION;  Surgeon: JaJettie BoozeMD;  [place]: [facility] Service: Cardiovascular;  Laterality: N/A;   CESAREAN SECTION     COLONOSCOPY WITH PROPOFOL N/A 04/27/2016   Procedure: COLONOSCOPY WITH PROPOFOL;  Surgeon: ViWilford CornerMD;  [facility];  Service: Endoscopy;  Laterality: N/A;   INTRAOPERATIVE TRANSESOPHAGEAL  ECHOCARDIOGRAM  02/15/2012   Procedure: INTRAOPERATIVE TRANSESOPHAGEAL ECHOCARDIOGRAM;  Surgeon: ClRexene AlbertsMD;  [place] Service: Open Heart Surgery;  Laterality: N/A;   MAZE  02/15/2012   Procedure: MAZE;  Surgeon: ClRexene AlbertsMD;  [facility] Service: Open Heart Surgery;  Laterality: N/A;   MITRAL VALVE REPLACEMENT  02/15/2012   Procedure: MITRAL VALVE (MV) REPLACEMENT;  Surgeon: ClRexene AlbertsMD;  [place]: [facility] Service: Open Heart Surgery;  Laterality: N/A;   ROBOTIC ASSISTED LAPAROSCOPIC HYSTERECTOMY AND SALPINGECTOMY Bilateral 01/02/2019   Procedure: XI ROBOTIC ASSISTED LAPAROSCOPIC TOTAL HYSTERECTOMY WITH BILATERAL SALPINGOOPHORECTOMY, SENTINEL LYMPH NODE BIOPSY;  Surgeon: TuLafonda MossesMD;  Location: [facility];  Service: Gynecology;  Laterality: Bilateral;   TEE WITHOUT CARDIOVERSION  12/21/2011   Procedure: TRANSESOPHAGEAL ECHOCARDIOGRAM (TEE);  Surgeon: JaJettie BoozeMD;  [facility];  Service: Cardiovascular;  Laterality: N/A;   TRICUSPID VALVE REPLACEMENT  02/15/2012   Procedure: TRICUSPID VALVE REPAIR;  Surgeon: ClRexene AlbertsMD;  [place] Service: Open Heart Surgery;  Laterality: N/A;   TUBAL LIGATION     at time of her c-section    Family History  Problem Relation Age of Onset   Asthma Mother    Diabetes Mother    Hypertension Mother    Hypertension Brother    Heart attack Neg Hx    Stroke Neg Hx    Colon cancer Neg Hx    Colon polyps Neg Hx    Liver disease Neg Hx    Uterine cancer Neg Hx    Ovarian cancer Neg Hx    Breast cancer Neg Hx     Social History   Socioeconomic History   Marital status: Married    Spouse name: Not on file   Number of children: Not on file   Years of education: Not on file   Highest education level: Not on file  Occupational History   Occupation: laConservation officer, nature  Employer: [employer]  Tobacco Use   Smoking status: Never   Smokeless tobacco: Never  Vaping  Use   Vaping Use: Never used  Substance and Sexual Activity   Alcohol use: No   Drug use: No  Sexual activity: Yes    Birth control/protection: Post-menopausal  Other Topics Concern   Not on file  Social History Narrative   Pt lives in [place] with spouse.  Works at [employer]. 2 grown children, 1 grandchild   Social Determinants of Radio broadcast assistant Strain: Not on [employer] Insecurity: Not on file  Transportation Needs: Not on file  Physical Activity: Not on file  Stress: Not on file  Social Connections: Not on file    Current Medications:  Current Outpatient Medications:    amiodarone (PACERONE) 200 MG tablet, TAKE 1 TABLET(200 MG) BY MOUTH DAILY, Disp: 90 tablet, Rfl: 2   amLODipine (NORVASC) 10 MG tablet, TAKE 1 TABLET EVERY DAY (DOSE INCREASE), Disp: 90 tablet, Rfl: 1   atorvastatin (LIPITOR) 40 MG tablet, TAKE 1 TABLET(40 MG) BY MOUTH DAILY, Disp: 90 tablet, Rfl: 2   calcium carbonate (OS-CAL - DOSED IN MG OF ELEMENTAL CALCIUM) 1250 MG tablet, Take 1 tablet by mouth daily., Disp: , Rfl:    carvedilol (COREG) 12.5 MG tablet, TAKE 1 TABLET TWICE DAILY WITH A MEAL, Disp: 180 tablet, Rfl: 2   Cholecalciferol (VITAMIN D) 1000 UNITS capsule, Take 1,000 Units by mouth daily., Disp: , Rfl:    cyclobenzaprine (FLEXERIL) 10 MG tablet, Take 10 mg by mouth daily. , Disp: , Rfl:    dexamethasone (DECADRON) 4 MG tablet, Take 2 tabs at the night before chemotherapy, every 3 weeks, by mouth x 6 cycles, please dispense 12 tabs, Disp: 60 tablet, Rfl: 0   insulin glargine (LANTUS) 100 UNIT/ML injection, Inject 30-35 Units into the skin See admin instructions. Inject 30 units into the skin in the morning and 35 units at bedtime., Disp: , Rfl:    irbesartan (AVAPRO) 300 MG tablet, TAKE 1 TABLET(300 MG) BY MOUTH DAILY, Disp: 90 tablet, Rfl: 2   metFORMIN (GLUCOPHAGE) 500 MG tablet, Take 500 mg by mouth 2 (two) times daily with a meal. , Disp: , Rfl:    methocarbamol  (ROBAXIN) 500 MG tablet, Take 500 mg by mouth every 8 (eight) hours as needed for muscle spasms. , Disp: , Rfl:    senna (SENOKOT) 8.6 MG TABS tablet, Take 1 tablet (8.6 mg total) by mouth daily as needed for up to 15 doses for mild constipation., Disp: 15 tablet, Rfl: 0   warfarin (COUMADIN) 4 MG tablet, TAKE 1 AND 1/2 TABLETS TO 2 TABLETS BY MOUTH DAILY AS DIRECTED BY COUMADIN CLINIC, Disp: 180 tablet, Rfl: 1  Review of Systems: Denies appetite changes, fevers, chills, fatigue, unexplained weight changes. Denies hearing loss, neck lumps or masses, mouth sores, ringing in ears or voice changes. Denies cough or wheezing.  Denies shortness of breath. Denies chest pain or palpitations. Denies leg swelling. Denies abdominal distention, pain, blood in stools, constipation, diarrhea, nausea, vomiting, or early satiety. Denies pain with intercourse, dysuria, frequency, hematuria or incontinence. Denies hot flashes, pelvic pain, vaginal bleeding or vaginal discharge.   Denies joint pain, back pain or muscle pain/cramps. Denies itching, rash, or wounds. Denies dizziness, headaches, numbness or seizures. Denies swollen lymph nodes or glands, denies easy bruising or bleeding. Denies anxiety, depression, confusion, or decreased concentration.  Physical Exam: BP (!) 142/67 (BP Location: Left Arm, Patient Position: Sitting)    Pulse 85    Temp 98 F (36.7 C) (Oral)    Resp 16    Wt 231 lb 8 oz (105 kg)    SpO2 96%    BMI 37.37 kg/m  General: Alert, oriented, no acute distress. HEENT: Normocephalic, atraumatic, sclera anicteric. Chest: Clear to auscultation bilaterally.  No wheezes or rhonchi. Cardiovascular: Regular rate and rhythm, no murmurs. Abdomen: Obese, soft, nontender.  Normoactive bowel sounds.  No masses or hepatosplenomegaly appreciated.  well-healed incisions. Extremities: Grossly normal range of motion.  Warm, well perfused.  No edema bilaterally. Skin: No rashes or lesions  noted. Lymphatics: No cervical, supraclavicular, or inguinal adenopathy. GU: Normal appearing external genitalia without erythema, excoriation, or lesions.  Speculum exam reveals moderately atrophic vaginal mucosa, with radiation changes evident.  No bleeding or discharge no masses noted.  Bimanual exam reveals no nodularity or masses.  Rectovaginal exam confirms findings.  Laboratory & Radiologic Studies: None new  Assessment & Plan: [name] is a 66 y.o. 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 Adjuvant platinum-based chemotherapy and vaginal brachytherapy completed in 05/2019.  The patient is NED on exam today.  She is overall doing quite well without any lasting sequelae from treatment.  Per SGO surveillance recommendations, in the setting of high risk endometrial cancer, we will plan visits for surveillance every 3 months for the first 2 years.  We discussed the signs and symptoms that would be concerning for disease recurrence and should prompt a phone call prior to her next visit.  She will be seen in radiation oncology in 3 months and return to see me in 6 months for follow-up.  28 minutes of total time was spent for this patient encounter, including preparation, face-to-face counseling with the patient and coordination of care, and documentation of the encounter.  Jeral Pinch, MD  Division of Gynecologic Oncology  Department of Obstetrics and Gynecology  [facility]

## 2021-02-04 NOTE — Patient Instructions (Addendum)
Was good to see you today.  I do not see or feel any evidence of cancer on your exam.  I will see you back for follow-up in 6 months.  Please call back in late May or June to schedule a follow-up with me in late July.  Please call if you develop any concerning symptoms for cancer recurrence before then, such as vaginal bleeding or pelvic pain.

## 2021-02-05 ENCOUNTER — Ambulatory Visit (INDEPENDENT_AMBULATORY_CARE_PROVIDER_SITE_OTHER): Payer: Medicare HMO

## 2021-02-05 ENCOUNTER — Other Ambulatory Visit: Payer: Self-pay

## 2021-02-05 DIAGNOSIS — Z5181 Encounter for therapeutic drug level monitoring: Secondary | ICD-10-CM | POA: Diagnosis not present

## 2021-02-05 DIAGNOSIS — I480 Paroxysmal atrial fibrillation: Secondary | ICD-10-CM

## 2021-02-05 DIAGNOSIS — Z954 Presence of other heart-valve replacement: Secondary | ICD-10-CM | POA: Diagnosis not present

## 2021-02-05 LAB — POCT INR: INR: 2.4 (ref 2.0–3.0)

## 2021-02-05 NOTE — Patient Instructions (Signed)
Description   - START taking warfarin 1.5 tablets daily except for 1 tablets on Mondays.   - Recheck INR in 3 weeks.   Call coumadin clinic for any changes in medications or up coming procedures. [phone].

## 2021-02-26 ENCOUNTER — Ambulatory Visit (INDEPENDENT_AMBULATORY_CARE_PROVIDER_SITE_OTHER): Payer: Medicare HMO | Admitting: *Deleted

## 2021-02-26 ENCOUNTER — Other Ambulatory Visit: Payer: Self-pay

## 2021-02-26 DIAGNOSIS — Z5181 Encounter for therapeutic drug level monitoring: Secondary | ICD-10-CM

## 2021-02-26 DIAGNOSIS — Z954 Presence of other heart-valve replacement: Secondary | ICD-10-CM | POA: Diagnosis not present

## 2021-02-26 DIAGNOSIS — I480 Paroxysmal atrial fibrillation: Secondary | ICD-10-CM | POA: Diagnosis not present

## 2021-02-26 LAB — POCT INR: INR: 3.1 — AB (ref 2.0–3.0)

## 2021-02-26 NOTE — Patient Instructions (Signed)
Description   Continue taking warfarin 1.5 tablets daily except for 1 tablets on Mondays. Recheck INR in 4 weeks. Call coumadin clinic for any changes in medications or up coming procedures. [phone].

## 2021-03-16 DIAGNOSIS — D6869 Other thrombophilia: Secondary | ICD-10-CM | POA: Diagnosis not present

## 2021-03-16 DIAGNOSIS — N1831 Chronic kidney disease, stage 3a: Secondary | ICD-10-CM | POA: Diagnosis not present

## 2021-03-16 DIAGNOSIS — Z23 Encounter for immunization: Secondary | ICD-10-CM | POA: Diagnosis not present

## 2021-03-16 DIAGNOSIS — Z794 Long term (current) use of insulin: Secondary | ICD-10-CM | POA: Diagnosis not present

## 2021-03-16 DIAGNOSIS — E1169 Type 2 diabetes mellitus with other specified complication: Secondary | ICD-10-CM | POA: Diagnosis not present

## 2021-03-16 DIAGNOSIS — I110 Hypertensive heart disease with heart failure: Secondary | ICD-10-CM | POA: Diagnosis not present

## 2021-03-16 DIAGNOSIS — E7800 Pure hypercholesterolemia, unspecified: Secondary | ICD-10-CM | POA: Diagnosis not present

## 2021-03-16 DIAGNOSIS — I509 Heart failure, unspecified: Secondary | ICD-10-CM | POA: Diagnosis not present

## 2021-03-16 DIAGNOSIS — I700 Atherosclerosis of aorta: Secondary | ICD-10-CM | POA: Diagnosis not present

## 2021-03-16 DIAGNOSIS — M810 Age-related osteoporosis without current pathological fracture: Secondary | ICD-10-CM | POA: Diagnosis not present

## 2021-03-18 DIAGNOSIS — H5203 Hypermetropia, bilateral: Secondary | ICD-10-CM | POA: Diagnosis not present

## 2021-03-18 DIAGNOSIS — H524 Presbyopia: Secondary | ICD-10-CM | POA: Diagnosis not present

## 2021-03-26 ENCOUNTER — Ambulatory Visit (INDEPENDENT_AMBULATORY_CARE_PROVIDER_SITE_OTHER): Payer: Medicare HMO | Admitting: *Deleted

## 2021-03-26 ENCOUNTER — Other Ambulatory Visit: Payer: Self-pay

## 2021-03-26 DIAGNOSIS — I480 Paroxysmal atrial fibrillation: Secondary | ICD-10-CM | POA: Diagnosis not present

## 2021-03-26 DIAGNOSIS — Z5181 Encounter for therapeutic drug level monitoring: Secondary | ICD-10-CM

## 2021-03-26 DIAGNOSIS — Z954 Presence of other heart-valve replacement: Secondary | ICD-10-CM

## 2021-03-26 LAB — POCT INR: INR: 2 (ref 2.0–3.0)

## 2021-03-26 NOTE — Patient Instructions (Signed)
Description   ?Today take 2 tablets then continue taking warfarin 1.5 tablets daily except for 1 tablets on Mondays. Recheck INR in 4 weeks. Call coumadin clinic for any changes in medications or up coming procedures. [phone].  ?  ?  ?

## 2021-04-18 NOTE — Progress Notes (Signed)
?Radiation Oncology         [phone] ?________________________________ ? ?Name: [name] MRN: [id number]  ?Date: 04/19/2021  DOB: 1955/07/15 ? ?Follow-Up Visit Note ? ?CC: [name], [place], [place]  [name], [place], [name] ? ?  ICD-10-CM   ?1. Endometrial cancer ([place])  C54.1   ?  ?2. Malignant neoplasm of endometrium (HCC)  C54.1   ?  ? ? ?Diagnosis:   Stage II endometrial cancer, high-grade ? ?Interval Since Last Radiation:  2 years and 5 days  ? ?Radiation Treatment Dates: 03/18/2019 through 04/15/2019 ?Site Technique Total Dose (Gy) Dose per Fx (Gy) Completed Fx Beam Energies  ?Vagina: Pelvis HDR-brachy 30/30 6 5/5 Ir-192  ? ? ?Narrative:  The patient returns today for routine 6 month follow-up, she was last seen here for follow up on 10/15/20. Since her last visit, the patient followed up with Dr. Berline Lopes on 02/04/21. During this visit, the patient denied any symptoms concerning for disease recurrence and was noted as NED on examination.   ? ?Imaging performed in the interval since she was last seen includes a routine bilateral screening mammogram on 11/13/20 which showed no evidence of disease recurrence.    ? ?Otherwise, no significant interval history since the patient was last seen.  ? ?She is not using her vaginal dilator and I discussed the importance of this issue.  She reports infrequent intercourse..                          ? ?Allergies:  is allergic to chlorhexidine. ? ?Meds: ?Current Outpatient Medications  ?Medication Sig Dispense Refill  ? alendronate (FOSAMAX) 70 MG tablet Take 70 mg by mouth once a week. Take with a full glass of water on an empty stomach.    ? amiodarone (PACERONE) 200 MG tablet TAKE 1 TABLET(200 MG) BY MOUTH DAILY 90 tablet 2  ? amLODipine (NORVASC) 10 MG tablet TAKE 1 TABLET EVERY DAY (DOSE INCREASE) 90 tablet 1  ? atorvastatin (LIPITOR) 40 MG tablet TAKE 1 TABLET(40 MG) BY MOUTH DAILY 90 tablet 2  ? calcium carbonate (OS-CAL - DOSED IN MG OF ELEMENTAL CALCIUM) 1250 MG tablet Take 1  tablet by mouth daily.    ? carvedilol (COREG) 12.5 MG tablet TAKE 1 TABLET TWICE DAILY WITH A MEAL 180 tablet 2  ? Cholecalciferol (VITAMIN D) 1000 UNITS capsule Take 1,000 Units by mouth daily.    ? cyclobenzaprine (FLEXERIL) 10 MG tablet Take 10 mg by mouth daily.     ? dexamethasone (DECADRON) 4 MG tablet Take 2 tabs at the night before chemotherapy, every 3 weeks, by mouth x 6 cycles, please dispense 12 tabs 60 tablet 0  ? insulin glargine (LANTUS) 100 UNIT/ML injection Inject 30-35 Units into the skin See admin instructions. Inject 30 units into the skin in the morning and 35 units at bedtime.    ? irbesartan (AVAPRO) 300 MG tablet TAKE 1 TABLET(300 MG) BY MOUTH DAILY 90 tablet 2  ? metFORMIN (GLUCOPHAGE) 500 MG tablet Take 500 mg by mouth 2 (two) times daily with a meal.     ? methocarbamol (ROBAXIN) 500 MG tablet Take 500 mg by mouth every 8 (eight) hours as needed for muscle spasms.     ? senna (SENOKOT) 8.6 MG TABS tablet Take 1 tablet (8.6 mg total) by mouth daily as needed for up to 15 doses for mild constipation. 15 tablet 0  ? warfarin (COUMADIN) 4 MG tablet TAKE 1 AND 1/2  TABLETS TO 2 TABLETS BY MOUTH DAILY AS DIRECTED BY COUMADIN CLINIC 180 tablet 1  ? ?No current facility-administered medications for this encounter.  ? ? ?Physical Findings: ?The patient is in no acute distress. Patient is alert and oriented. ? height is '5\' 6"'$  (1.676 m) and weight is 238 lb 12.8 oz (108.3 kg). Her temporal temperature is 97.5 ?F (36.4 ?C) (abnormal). Her blood pressure is 173/77 (abnormal) and her pulse is 71. Her respiration is 20 and oxygen saturation is 98%. .  . Lungs are clear to auscultation bilaterally. Heart has regular rate and rhythm. No palpable cervical, supraclavicular, or axillary adenopathy. Abdomen soft, non-tender, normal bowel sounds. ? ?On pelvic examination the external genitalia were unremarkable. A speculum exam was performed. There are no mucosal lesions noted in the vaginal vault.  Some  radiation changes noted at the vaginal cuff. . On bimanual and rectovaginal examination there were no pelvic masses appreciated.  Vaginal cuff intact.  Rectal sphincter tone normal. ? ? ? ?Lab Findings: ?Lab Results  ?Component Value Date  ? WBC 3.9 (L) 06/20/2019  ? HGB 8.6 (L) 06/20/2019  ? HCT 27.6 (L) 06/20/2019  ? MCV 101.8 (H) 06/20/2019  ? PLT 134 (L) 06/20/2019  ? ? ?Radiographic Findings: ?No results found. ? ?Impression:  Stage II endometrial cancer, high-grade ? ?No evidence of recurrence on clinical exam today.  The patient does not appear to be exhibiting any long-term effects from her surgery and vaginal brachytherapy. ? ?Plan: She will follow-up with Dr. Berline Lopes in 3 months.  Radiation oncology in 6 months.  At that point the patient can likely proceed with 97-monthinterval follow-up or even sooner since she is she is now 2 years out from her vaginal brachytherapy. ? ? ?23 minutes of total time was spent for this patient encounter, including preparation, face-to-face counseling with the patient and coordination of care, physical exam, and documentation of the encounter. ?____________________________________ ? ?JBlair Promise PhD, MD ? ?This document serves as a record of services personally performed by JGery Pray MD. It was created on his behalf by ERoney Mans a trained medical scribe. The creation of this record is based on the scribe's personal observations and the provider's statements to them. This document has been checked and approved by the attending provider. ? ?

## 2021-04-19 ENCOUNTER — Ambulatory Visit
Admission: RE | Admit: 2021-04-19 | Discharge: 2021-04-19 | Disposition: A | Discharge status: Home / Self Care | Payer: Medicare HMO | Source: Ambulatory Visit | Attending: Radiation Oncology | Admitting: Radiation Oncology

## 2021-04-19 ENCOUNTER — Other Ambulatory Visit: Payer: Self-pay

## 2021-04-19 ENCOUNTER — Encounter: Payer: Self-pay | Admitting: Radiation Oncology

## 2021-04-19 VITALS — BP 173/77 | HR 71 | Temp 97.5°F | Resp 20 | Ht 66.0 in | Wt 238.8 lb

## 2021-04-19 DIAGNOSIS — Z923 Personal history of irradiation: Secondary | ICD-10-CM | POA: Diagnosis not present

## 2021-04-19 DIAGNOSIS — Z8542 Personal history of malignant neoplasm of other parts of uterus: Secondary | ICD-10-CM | POA: Insufficient documentation

## 2021-04-19 DIAGNOSIS — Z7952 Long term (current) use of systemic steroids: Secondary | ICD-10-CM | POA: Diagnosis not present

## 2021-04-19 DIAGNOSIS — C541 Malignant neoplasm of endometrium: Secondary | ICD-10-CM

## 2021-04-19 DIAGNOSIS — Z7984 Long term (current) use of oral hypoglycemic drugs: Secondary | ICD-10-CM | POA: Diagnosis not present

## 2021-04-19 DIAGNOSIS — Z79899 Other long term (current) drug therapy: Secondary | ICD-10-CM | POA: Diagnosis not present

## 2021-04-19 NOTE — Progress Notes (Addendum)
[name] is here today for follow up post radiation to the pelvic. ? ?They completed their radiation on: 2022  ? ?Does the patient complain of any of the following: ? ?Pain: No ?Abdominal bloating: No ?Diarrhea/Constipation: No ?Nausea/Vomiting: No ?Vaginal Discharge: No ?Blood in Urine or Stool: No ?Urinary Issues (dysuria/incomplete emptying/ incontinence/ increased frequency/urgency): Urgency on occasion ?Does patient report using vaginal dilator 2-3 times a week and/or sexually active 2-3 weeks: No usage of vaginal dilator. Patient is sexually active every six months. ?Post radiation skin changes: No ? ? ?Additional comments if applicable: N/A ?  ?

## 2021-04-23 ENCOUNTER — Ambulatory Visit (INDEPENDENT_AMBULATORY_CARE_PROVIDER_SITE_OTHER): Payer: Medicare HMO

## 2021-04-23 DIAGNOSIS — I480 Paroxysmal atrial fibrillation: Secondary | ICD-10-CM | POA: Diagnosis not present

## 2021-04-23 DIAGNOSIS — Z954 Presence of other heart-valve replacement: Secondary | ICD-10-CM

## 2021-04-23 DIAGNOSIS — Z5181 Encounter for therapeutic drug level monitoring: Secondary | ICD-10-CM

## 2021-04-23 LAB — POCT INR: INR: 2 (ref 2.0–3.0)

## 2021-04-23 NOTE — Patient Instructions (Addendum)
Description   ?Today take 2 tablets today then START taking warfarin 1.5 tablets daily.  ?Recheck INR in 2 weeks.  ?Call coumadin clinic for any changes in medications or up coming procedures. [phone].  ?  ?   ?

## 2021-05-11 NOTE — Progress Notes (Deleted)
Cardiology Office Note   Date:  05/11/2021   ID:  [name], [name] March 05, 1955, MRN [id number]  PCP:  Lennie Odor, PA    No chief complaint on file.  S/p MVR  Wt Readings from Last 3 Encounters:  04/19/21 238 lb 12.8 oz (108.3 kg)  02/04/21 231 lb 8 oz (105 kg)  10/15/20 233 lb (105.7 kg)       History of Present Illness: [name] is a 66 y.o. female   who had a mitral valve replacement and TV repair in 2014.  This was complicated by a post op CVA. She has had  atrial flutter. Dr. Rayann Heman recommended amio followed by cardioversion. She had the cardioversion in 07/2012.    2013 cath showed: The left main coronary artery is angiographically normal.   The left anterior descending artery is angiographically normal.  There is a medium sized diagonal which is widely patent.   The left circumflex artery is angiographically normal.  There is a large ramus vessel which is widely patent.  Large OM1 is angiographically normal.   The right coronary artery is a large dominant vessel which is angiographically normal.   In 2021, "She is on disability at this point.  She has some trouble going up stairs, and walking some distance even on flat ground.     Coumadin had been difficult to regulate.    Easier to regulate now, typically 3-5 weeks check.   She was diagnosed with uterine cancer and had surgery. She did well."   She has gained weight.  Not walking as much.  Uterine cancer treatment is finished.     Past Medical History:  Diagnosis Date   Abnormal chest CT    Aortic atherosclerosis (HCC)    Asthma    Atrial enlargement, left    severe   Atrial fibrillation (HCC) 01/05/2011   Chronic persistent, failed DCCV    Atrial flutter (HCC)    Bronchospasm 1998   Cardiomegaly    Carotid bruit    Chronic diastolic congestive heart failure ([place])    Diabetes mellitus    insulin dependent   Ejection fraction < 50%    35-40%,    Heart murmur    History of blood transfusion     History of radiation therapy 03/18/19-04/15/19   endometrial - vaginal brachytherapy -  Dr. Sondra Come    Hypercholesterolemia    Hypertension    Junctional rhythm 09/14/2018   Noted on EKG   Left bundle branch block (LBBB) 09/14/2018   Noted on EKG   LVH (left ventricular hypertrophy) 10/10/2016   Moderate, Noted on ECHO   Mitral regurgitation    Obesity    Obesity (BMI 30-39.9) 01/16/2012   Persistent atrial fibrillation (HCC)    Polyp of rectum    Rheumatic fever 01/16/2012   Reported during childhood   S/P Maze operation for atrial fibrillation 02/15/2012   Complete biatrial lesion set using bipolar radiofrequency and cryothermy ablation with clipping of LA appendage   S/P mitral valve replacement with metallic valve 06/11/3760   70m Sorin [employer] Optiform mechanical prosthesis   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   218mEdwards mc3 ring annuloplasty   Shortness of breath    with exertion   Sleep apnea    DOES NOT HAVE CPAP   [place]   Post op , left arm weakness   Tricuspid regurgitation 01/16/2012   [facility]    Past Surgical History:  Procedure Laterality Date   CARDIAC CATHETERIZATION  >5 years   CARDIOVASCULAR STRESS TEST  09/2010   CARDIOVERSION  03/25/2011   Procedure: CARDIOVERSION;  Surgeon: Jettie Booze, MD;  Location: [place];  Service: Cardiovascular;  Laterality: N/A;   CARDIOVERSION N/A 07/27/2012   Procedure: CARDIOVERSION;  Surgeon: Jettie Booze, MD;  Location: [place];  Service: Cardiovascular;  Laterality: N/A;   CESAREAN SECTION     COLONOSCOPY WITH PROPOFOL N/A 04/27/2016   Procedure: COLONOSCOPY WITH PROPOFOL;  Surgeon: Wilford Corner, MD;  [facility];  Service: Endoscopy;  Laterality: N/A;   INTRAOPERATIVE TRANSESOPHAGEAL ECHOCARDIOGRAM  02/15/2012   Procedure: INTRAOPERATIVE TRANSESOPHAGEAL ECHOCARDIOGRAM;  Surgeon: Rexene Alberts, MD;  Location: [place];  Service: Open Heart Surgery;  Laterality:  N/A;   MAZE  02/15/2012   Procedure: MAZE;  Surgeon: Rexene Alberts, MD;  Location: [place];  Service: Open Heart Surgery;  Laterality: N/A;   MITRAL VALVE REPLACEMENT  02/15/2012   Procedure: MITRAL VALVE (MV) REPLACEMENT;  Surgeon: Rexene Alberts, MD;  Location: [place];  Service: Open Heart Surgery;  Laterality: N/A;   ROBOTIC ASSISTED LAPAROSCOPIC HYSTERECTOMY AND SALPINGECTOMY Bilateral 01/02/2019   Procedure: XI ROBOTIC ASSISTED LAPAROSCOPIC TOTAL HYSTERECTOMY WITH BILATERAL SALPINGOOPHORECTOMY, SENTINEL LYMPH NODE BIOPSY;  Surgeon: [name], MD;  Location: [facility];  Service: Gynecology;  Laterality: Bilateral;   TEE WITHOUT CARDIOVERSION  12/21/2011   Procedure: TRANSESOPHAGEAL ECHOCARDIOGRAM (TEE);  Surgeon: Jettie Booze, MD;  Location: [facility] ENDOSCOPY;  Service: Cardiovascular;  Laterality: N/A;   TRICUSPID VALVE REPLACEMENT  02/15/2012   Procedure: TRICUSPID VALVE REPAIR;  Surgeon: Rexene Alberts, MD;  Location: [place];  Service: Open Heart Surgery;  Laterality: N/A;   TUBAL LIGATION     at time of her c-section     Current Outpatient Medications  Medication Sig Dispense Refill   alendronate (FOSAMAX) 70 MG tablet Take 70 mg by mouth once a week. Take with a full glass of water on an empty stomach.     amiodarone (PACERONE) 200 MG tablet TAKE 1 TABLET(200 MG) BY MOUTH DAILY 90 tablet 2   amLODipine (NORVASC) 10 MG tablet TAKE 1 TABLET EVERY DAY (DOSE INCREASE) 90 tablet 1   atorvastatin (LIPITOR) 40 MG tablet TAKE 1 TABLET(40 MG) BY MOUTH DAILY 90 tablet 2   calcium carbonate (OS-CAL - DOSED IN MG OF ELEMENTAL CALCIUM) 1250 MG tablet Take 1 tablet by mouth daily.     carvedilol (COREG) 12.5 MG tablet TAKE 1 TABLET TWICE DAILY WITH A MEAL 180 tablet 2   Cholecalciferol (VITAMIN D) 1000 UNITS capsule Take 1,000 Units by mouth daily.     cyclobenzaprine (FLEXERIL) 10 MG tablet Take 10 mg by mouth daily.      dexamethasone (DECADRON) 4 MG tablet Take 2 tabs at the night before  chemotherapy, every 3 weeks, by mouth x 6 cycles, please dispense 12 tabs 60 tablet 0   insulin glargine (LANTUS) 100 UNIT/ML injection Inject 30-35 Units into the skin See admin instructions. Inject 30 units into the skin in the morning and 35 units at bedtime.     irbesartan (AVAPRO) 300 MG tablet TAKE 1 TABLET(300 MG) BY MOUTH DAILY 90 tablet 2   metFORMIN (GLUCOPHAGE) 500 MG tablet Take 500 mg by mouth 2 (two) times daily with a meal.      methocarbamol (ROBAXIN) 500 MG tablet Take 500 mg by mouth every 8 (eight) hours as needed for muscle spasms.  senna (SENOKOT) 8.6 MG TABS tablet Take 1 tablet (8.6 mg total) by mouth daily as needed for up to 15 doses for mild constipation. 15 tablet 0   warfarin (COUMADIN) 4 MG tablet TAKE 1 AND 1/2 TABLETS TO 2 TABLETS BY MOUTH DAILY AS DIRECTED BY COUMADIN CLINIC 180 tablet 1   No current facility-administered medications for this visit.    Allergies:   Chlorhexidine    Social History:  The patient  reports that she has never smoked. She has never used smokeless tobacco. She reports that she does not drink alcohol and does not use drugs.   Family History:  The patient's ***family history includes Asthma in her mother; Diabetes in her mother; Hypertension in her brother and mother.    ROS:  Please see the history of present illness.   Otherwise, review of systems are positive for ***.   All other systems are reviewed and negative.    PHYSICAL EXAM: VS:  There were no vitals taken for this visit. , BMI There is no height or weight on file to calculate BMI. GEN: Well nourished, well developed, in no acute distress HEENT: normal Neck: no JVD, carotid bruits, or masses Cardiac: ***RRR; no murmurs, rubs, or gallops,no edema  Respiratory:  clear to auscultation bilaterally, normal work of breathing GI: soft, nontender, nondistended, + BS MS: no deformity or atrophy Skin: warm and dry, no rash Neuro:  Strength and sensation are intact Psych:  euthymic mood, full affect   EKG:   The ekg ordered today demonstrates ***   Recent Labs: No results found for requested labs within last 8760 hours.   Lipid Panel    Component Value Date/Time   CHOL 152 05/28/2015 0958   TRIG 108 05/28/2015 0958   HDL 35 (L) 05/28/2015 0958   CHOLHDL 4.3 05/28/2015 0958   VLDL 22 05/28/2015 0958   LDLCALC 95 05/28/2015 0958     Other studies Reviewed: Additional studies/ records that were reviewed today with results demonstrating: ***.   ASSESSMENT AND PLAN:  S/p MVR:  PAF: Chronic systolic heart failure: HTN: Anticoagulated: Acquired thrombophilia.    Current medicines are reviewed at length with the patient today.  The patient concerns regarding her medicines were addressed.  The following changes have been made:  No change***  Labs/ tests ordered today include: *** No orders of the defined types were placed in this encounter.   Recommend 150 minutes/week of aerobic exercise Low fat, low carb, high fiber diet recommended  Disposition:   FU in ***   Signed, Larae Grooms, MD  05/11/2021 10:43 PM    [facility] [place], [place], [place]  [address] Phone: [phone]; Fax: [phone]

## 2021-05-12 ENCOUNTER — Telehealth: Payer: Self-pay | Admitting: *Deleted

## 2021-05-12 ENCOUNTER — Ambulatory Visit: Payer: Medicare HMO | Admitting: Interventional Cardiology

## 2021-05-12 DIAGNOSIS — Z7901 Long term (current) use of anticoagulants: Secondary | ICD-10-CM

## 2021-05-12 DIAGNOSIS — I480 Paroxysmal atrial fibrillation: Secondary | ICD-10-CM

## 2021-05-12 DIAGNOSIS — I059 Rheumatic mitral valve disease, unspecified: Secondary | ICD-10-CM

## 2021-05-12 DIAGNOSIS — Z954 Presence of other heart-valve replacement: Secondary | ICD-10-CM

## 2021-05-12 DIAGNOSIS — Z952 Presence of prosthetic heart valve: Secondary | ICD-10-CM

## 2021-05-12 DIAGNOSIS — I519 Heart disease, unspecified: Secondary | ICD-10-CM

## 2021-05-12 NOTE — Telephone Encounter (Signed)
Called pt since she missed her appt today; there was no answer & voicemail full on primary line; left a message on secondary line to call [place] back at [phone]. Will await a call back to get pt rescheduled for Anticoagulation Appt.  ?

## 2021-05-13 ENCOUNTER — Ambulatory Visit (INDEPENDENT_AMBULATORY_CARE_PROVIDER_SITE_OTHER): Payer: Medicare HMO

## 2021-05-13 DIAGNOSIS — I480 Paroxysmal atrial fibrillation: Secondary | ICD-10-CM | POA: Diagnosis not present

## 2021-05-13 DIAGNOSIS — Z5181 Encounter for therapeutic drug level monitoring: Secondary | ICD-10-CM | POA: Diagnosis not present

## 2021-05-13 DIAGNOSIS — Z954 Presence of other heart-valve replacement: Secondary | ICD-10-CM | POA: Diagnosis not present

## 2021-05-13 LAB — POCT INR: INR: 1.5 — AB (ref 2.0–3.0)

## 2021-05-13 NOTE — Patient Instructions (Signed)
Description   ?Today take 2 tablets and 2 tablets tomorrow and then START taking warfarin 1.5 tablets daily except 2 tablets on Mondays and Wednesdays.  ?Stay consistent with greens each week (2-3 times per week) ?Recheck INR in 1 week.  ?Call coumadin clinic for any changes in medications or up coming procedures. [phone].  ?  ?   ?

## 2021-05-20 ENCOUNTER — Ambulatory Visit (INDEPENDENT_AMBULATORY_CARE_PROVIDER_SITE_OTHER): Payer: Medicare HMO | Admitting: *Deleted

## 2021-05-20 DIAGNOSIS — Z954 Presence of other heart-valve replacement: Secondary | ICD-10-CM

## 2021-05-20 DIAGNOSIS — I480 Paroxysmal atrial fibrillation: Secondary | ICD-10-CM | POA: Diagnosis not present

## 2021-05-20 DIAGNOSIS — Z5181 Encounter for therapeutic drug level monitoring: Secondary | ICD-10-CM

## 2021-05-20 LAB — POCT INR: INR: 2.3 (ref 2.0–3.0)

## 2021-05-20 NOTE — Patient Instructions (Signed)
Description   ?Today take 2 tablets then START taking warfarin 1.5 tablets daily except 2 tablets on Mondays, Wednesdays, and Fridays. Stay consistent with greens each week (2-3 times per week). Recheck INR in 2 weeks. Call coumadin clinic for any changes in medications or up coming procedures. [phone].  ?  ?  ?

## 2021-05-27 ENCOUNTER — Telehealth: Payer: Self-pay | Admitting: *Deleted

## 2021-05-27 NOTE — Telephone Encounter (Signed)
CALLED PATIENT TO INFORM OF FU WITH DR. Berline Lopes ON 07-12-21 - ARRIVAL TIME- 1:45 PM, SPOKE WITH PATIENT'S DAUGHTER TOMEKA Rochelle AND SHE IS AWARE OF THIS APPT.

## 2021-06-03 ENCOUNTER — Ambulatory Visit (INDEPENDENT_AMBULATORY_CARE_PROVIDER_SITE_OTHER): Payer: Medicare HMO

## 2021-06-03 DIAGNOSIS — I480 Paroxysmal atrial fibrillation: Secondary | ICD-10-CM

## 2021-06-03 DIAGNOSIS — Z5181 Encounter for therapeutic drug level monitoring: Secondary | ICD-10-CM

## 2021-06-03 DIAGNOSIS — Z954 Presence of other heart-valve replacement: Secondary | ICD-10-CM

## 2021-06-03 LAB — POCT INR: INR: 3.7 — AB (ref 2.0–3.0)

## 2021-06-03 NOTE — Patient Instructions (Signed)
Description   Skip today's dosage of Warfarin, then resume same dosage of Warfarin 1.5 tablets daily except 2 tablets on Mondays, Wednesdays, and Fridays. Stay consistent with greens each week (2-3 times per week). Recheck INR in 2 weeks. Call coumadin clinic for any changes in medications or up coming procedures. [phone].

## 2021-06-17 ENCOUNTER — Ambulatory Visit (INDEPENDENT_AMBULATORY_CARE_PROVIDER_SITE_OTHER): Payer: Medicare HMO | Admitting: *Deleted

## 2021-06-17 DIAGNOSIS — I480 Paroxysmal atrial fibrillation: Secondary | ICD-10-CM

## 2021-06-17 DIAGNOSIS — Z954 Presence of other heart-valve replacement: Secondary | ICD-10-CM

## 2021-06-17 DIAGNOSIS — Z5181 Encounter for therapeutic drug level monitoring: Secondary | ICD-10-CM | POA: Diagnosis not present

## 2021-06-17 LAB — POCT INR: INR: 4.3 — AB (ref 2.0–3.0)

## 2021-06-17 NOTE — Patient Instructions (Signed)
Description   Skip today's dosage of Warfarin, then start taking Warfarin 1.5 tablets daily except 2 tablets on Mondays and Fridays. Stay consistent with greens each week (2-3 times per week). Recheck INR in 2 weeks. Call coumadin clinic for any changes in medications or up coming procedures. [phone].

## 2021-06-18 ENCOUNTER — Telehealth: Payer: Self-pay | Admitting: *Deleted

## 2021-06-18 DIAGNOSIS — Z954 Presence of other heart-valve replacement: Secondary | ICD-10-CM

## 2021-06-18 NOTE — Telephone Encounter (Signed)
   Pre-operative Risk Assessment    Patient Name: [name]  DOB: 08/02/1955 MRN: [id number]     Request for Surgical Clearance    Procedure:  Dental Extraction - Amount of Teeth to be Pulled:  4 TEETH  Date of Surgery:  Clearance TBD                                 Surgeon:  DR. Diona Browner, DMD Surgeon's Group or Practice Name[facility] Phone number:  [phone] Fax number:  [phone]   Type of Clearance Requested:   - Medical  - Pharmacy:  Hold Warfarin (Coumadin)     Type of Anesthesia:  General    Additional requests/questions:    Jiles Prows   06/18/2021, 10:30 AM

## 2021-06-21 NOTE — Telephone Encounter (Signed)
Patient with diagnosis of Afib on warfarin for anticoagulation.    Procedure: Dental extraction (4 teeth) Date of procedure: TBD   CHA2DS2-VASc Score = 8  This indicates a 10.8% annual risk of stroke. The patient's score is based upon: CHF History: 1 HTN History: 1 Diabetes History: 1 Stroke History: 2 Vascular Disease History: 1 Age Score: 1 Gender Score: 1   CrCl 67 mL/min using AdjBW Platelet count 134K (2021)  Needs updated CBC  Patient does require pre-op antibiotics for dental procedure due to mechanical MVR. Amoxicillin 2g before the procedure is recommended.  Would check to see if dentist is willing to perform procedure without holding warfarin.  If not, patient will need Lovenox bridge.  Please route back to pharm pool with decision.

## 2021-06-22 IMAGING — CT CT CHEST W/ CM
2 of 5 series · 12 of 36 positions shown, 15 images · IV contrast (omnipaque)
Comparison: CT chest, 10/22/2010
COMPARISON: CT chest, 10/22/2010

CLINICAL DATA: Newly diagnosed clear cell endometrial cancer,
evaluate for metastatic disease

EXAM:
CT CHEST, ABDOMEN, AND PELVIS WITH CONTRAST
TECHNIQUE: Multidetector CT imaging of the chest, abdomen and pelvis was
performed following the standard protocol during bolus
administration of intravenous contrast.
CONTRAST:  100mL OMNIPAQUE IOHEXOL 300 MG/ML SOLN, additional oral
enteric contrast

[Series 2: cap with · axial · 0.74mm/px · z∈[-572,-52]mm · 9 of 128 slices shown, 12 images]
[im 12/128  mediastinal]
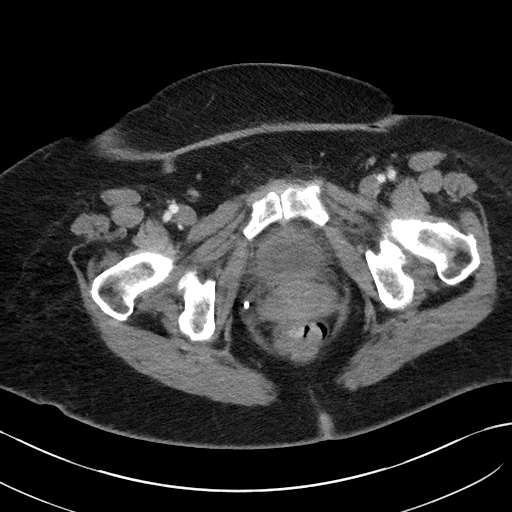
[im 12/128  lung]
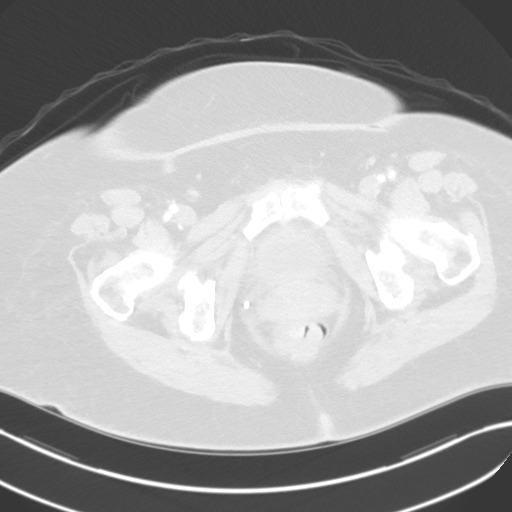
[im 24/128  lung]
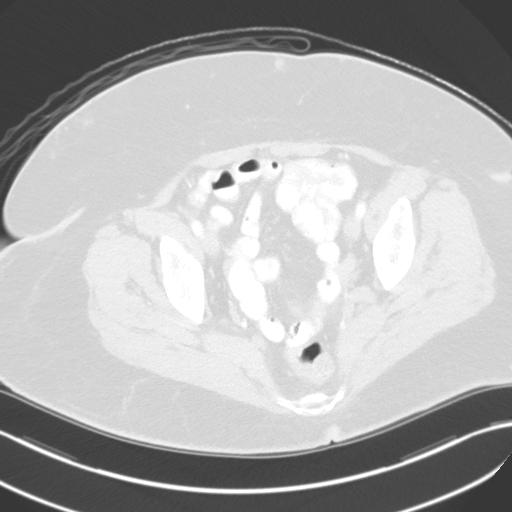
[im 35/128  lung]
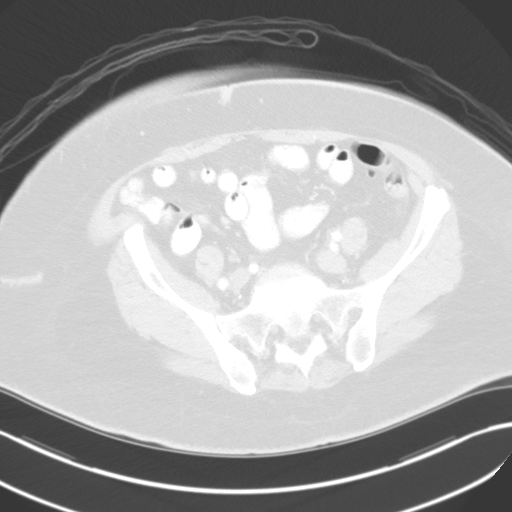
[im 47/128  lung]
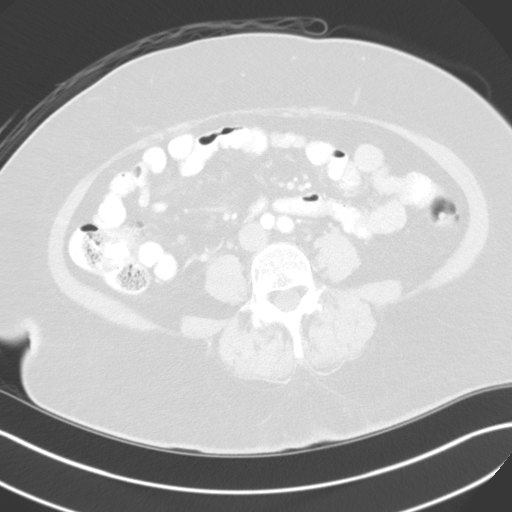
[im 70/128  mediastinal]
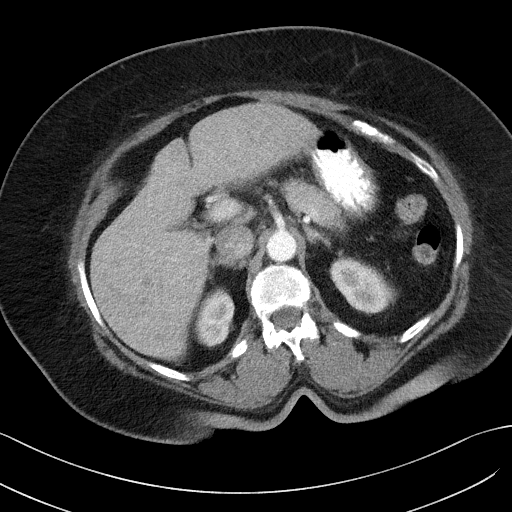
[im 70/128  lung]
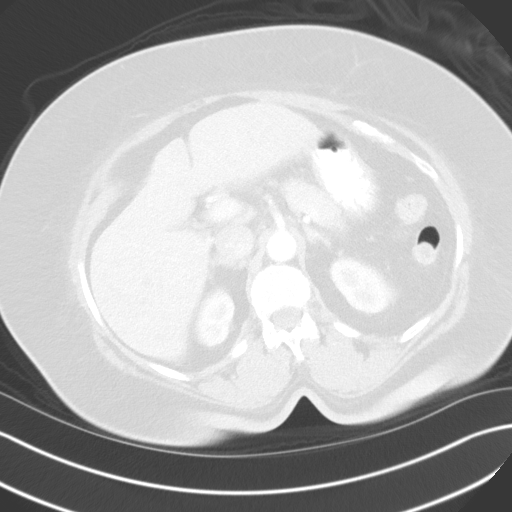
[im 81/128  lung]
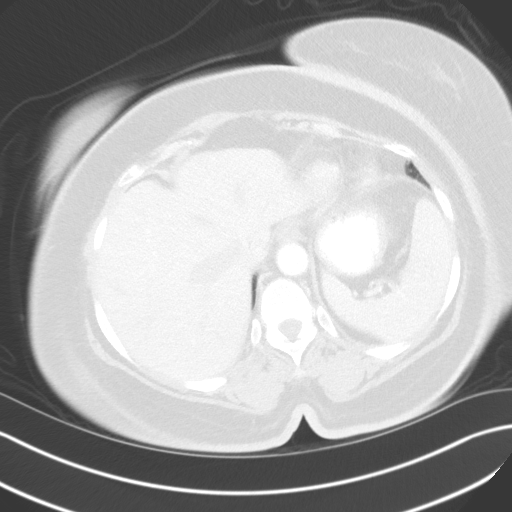
[im 93/128  lung]
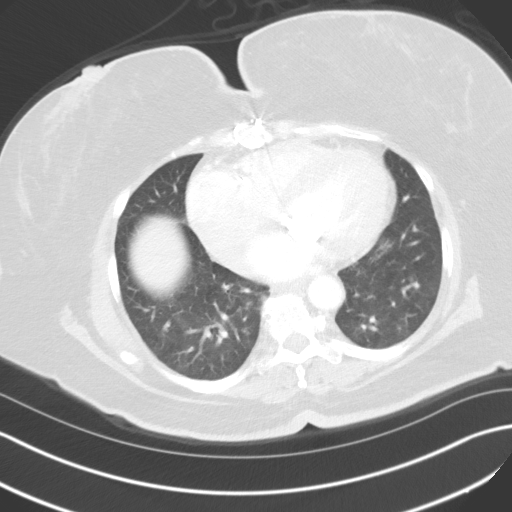
[im 104/128  lung]
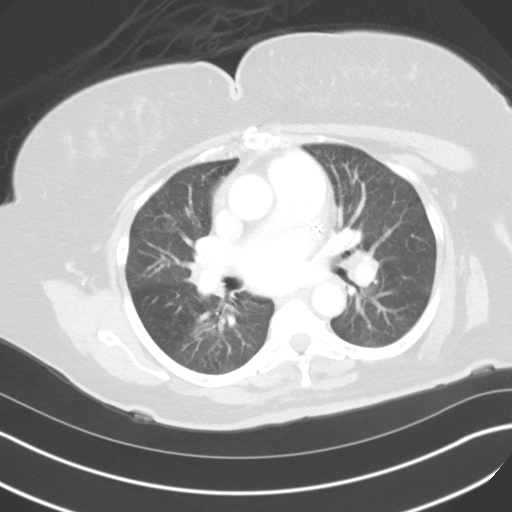
[im 116/128  mediastinal]
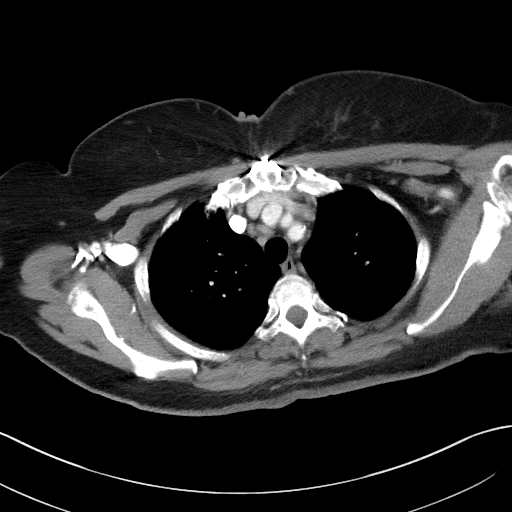
[im 116/128  lung]
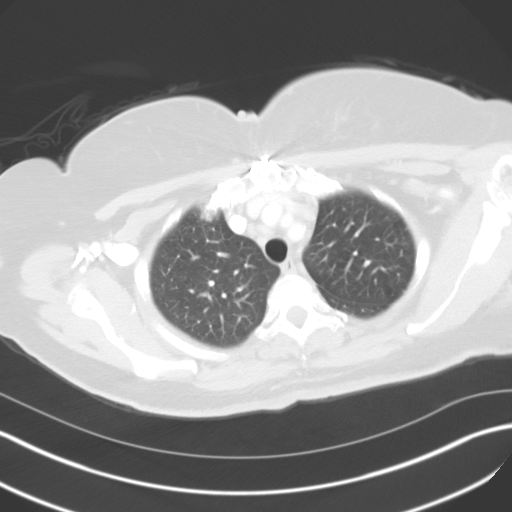

[Series 4: coronals · coronal · 0.84mm/px · 3 of 155 slices shown]
[im 31/155  lung]
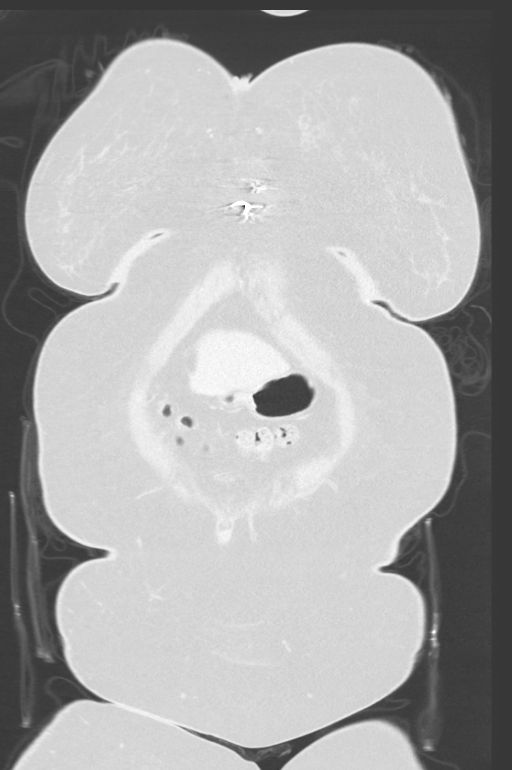
[im 62/155  lung]
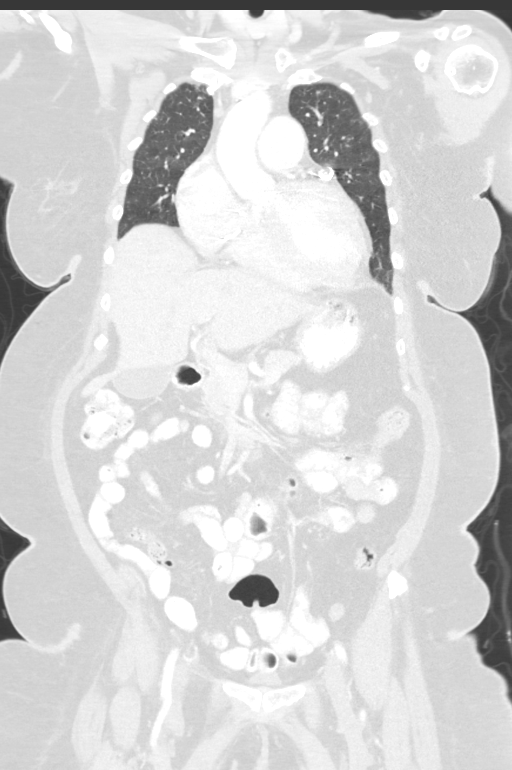
[im 93/155  lung]
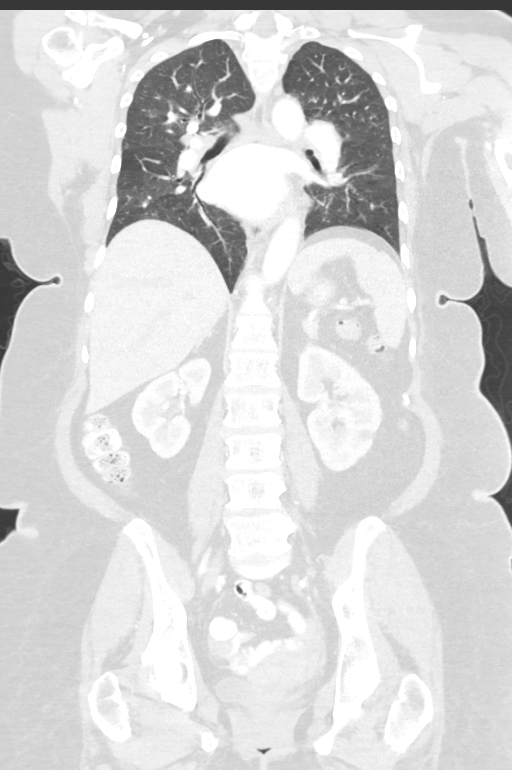

[12 of 36 positions shown; findings below may reference images not displayed]

FINDINGS: CT CHEST FINDINGS

Cardiovascular: Status post median sternotomy with mitral and
tricuspid valve prostheses and left atrial appendage clip.
Cardiomegaly no pericardial effusion.

Mediastinum/Nodes: Enlarged mediastinal and hilar lymph nodes,
largest pretracheal nodes measuring 1.8 x 1.2 cm (series 2, image
18). Thyroid gland, trachea, and esophagus demonstrate no
significant findings.

Lungs/Pleura: There are numerous small bilateral ground-glass
pulmonary nodules, most numerous in the lung apices and measuring 5
mm or smaller (e.g. Series 6, image 25, 34). There is mild mosaic
attenuation of the airspaces throughout. No pleural effusion or
pneumothorax.

Musculoskeletal: No chest wall mass or suspicious bone lesions
identified.

CT ABDOMEN PELVIS FINDINGS

Hepatobiliary: No solid liver abnormality is seen. Minimal calcific
sludge in the gallbladder. No gallbladder wall thickening, or
biliary dilatation.

Pancreas: Unremarkable. No pancreatic ductal dilatation or
surrounding inflammatory changes.

Spleen: Normal in size without significant abnormality.

Adrenals/Urinary Tract: Adrenal glands are unremarkable. Small
nonobstructive right renal calculi. Bladder is unremarkable.

Stomach/Bowel: Stomach is within normal limits. Appendix appears
normal. No evidence of bowel wall thickening, distention, or
inflammatory changes.

Vascular/Lymphatic: Scattered aortic atherosclerosis. No enlarged
abdominal or pelvic lymph nodes.

Reproductive: Low attenuation mass or fluid in the endometrial
cavity (series 2, image 110).

Other: No abdominal wall hernia or abnormality. No abdominopelvic
ascites.

Musculoskeletal: No acute or significant osseous findings.
IMPRESSION: 1.
FINDINGS: CT CHEST FINDINGS

Cardiovascular: Status post median sternotomy with mitral and
tricuspid valve prostheses and left atrial appendage clip.
Cardiomegaly. No pericardial effusion.

Mediastinum/Nodes: Enlarged mediastinal and hilar lymph nodes,
largest pretracheal nodes measuring 1.8 x 1.2 cm (series 2, image
18). Thyroid gland, trachea, and esophagus demonstrate no
significant findings.

Lungs/Pleura: There are numerous small bilateral ground-glass
pulmonary nodules, most numerous in the lung apices and measuring 5
mm or smaller (e.g. Series 6, image 25, 34). There is mild mosaic
attenuation of the airspaces throughout, likely related to small
airways disease. No pleural effusion or pneumothorax.

Musculoskeletal: No chest wall mass or suspicious bone lesions
identified.

CT ABDOMEN PELVIS FINDINGS

Hepatobiliary: No solid liver abnormality is seen. Minimal calcific
sludge in the gallbladder. No gallbladder wall thickening, or
biliary dilatation.

Pancreas: Unremarkable. No pancreatic ductal dilatation or
surrounding inflammatory changes.

Spleen: Normal in size without significant abnormality.

Adrenals/Urinary Tract: Adrenal glands are unremarkable. Small
nonobstructive right renal calculi. Bladder is unremarkable.

Stomach/Bowel: Stomach is within normal limits. Appendix appears
normal. No evidence of bowel wall thickening, distention, or
inflammatory changes.

Vascular/Lymphatic: Scattered aortic atherosclerosis. No enlarged
abdominal or pelvic lymph nodes.

Reproductive: Low attenuation mass or fluid in the endometrial
cavity (series 2, image 110).

Other: No abdominal wall hernia or abnormality. No abdominopelvic
ascites.

Musculoskeletal: No acute or significant osseous findings.
IMPRESSION: 1. Low attenuation mass or fluid in the endometrial cavity (series
2, image 110), in keeping with known endometrial malignancy.
2. Numerous small bilateral ground-glass pulmonary nodules. Although
nonspecific these are likely infectious or inflammatory given
appearance, isolated manifestation of metastatic disease much less
favored.
3. Enlarged mediastinal and hilar lymph nodes, likely reactive given
pulmonary findings.
4. No evidence of metastatic disease in the abdomen or pelvis.
5. Nonobstructive right nephrolithiasis.
6. Aortic Atherosclerosis ([id number].Y).

## 2021-06-22 NOTE — Telephone Encounter (Signed)
[place], MD  Chart reviewed as part of pre-operative protocol coverage. Because of NABEEHA BADERTSCHER past medical history and time since last visit, he/she will require a follow-up visit in order to better assess preoperative cardiovascular risk.  Pre-op covering staff: - Please contact dental office to ask if they are willing to perform procedure without holding warfarin. If not, patient will need a Lovenox bridge. - Please schedule appointment and call patient to inform them.Patient also needs updated CBC at office visit. - Please contact requesting surgeon's office via preferred method (i.e, phone, fax) to inform them of need for appointment prior to surgery.  Request regarding holding anticoagulant has been addressed by Pharm D.   Emmaline Life, NP-C    06/22/2021, 8:18 AM [place] [address] Office [phone] Fax [phone]

## 2021-06-22 NOTE — Telephone Encounter (Signed)
Attempted to reach patient to discuss appointment but did not get an answer and voicemail was full.

## 2021-06-25 NOTE — Telephone Encounter (Signed)
Cannot reach pt, vm is full. I will send FYI to requesting office, in hopes that they may s/w the pt, they may be able to let her know she needs to call the office to make an appt for pre op clearance.

## 2021-06-29 ENCOUNTER — Ambulatory Visit (INDEPENDENT_AMBULATORY_CARE_PROVIDER_SITE_OTHER): Payer: Medicare HMO | Admitting: *Deleted

## 2021-06-29 ENCOUNTER — Encounter: Payer: Self-pay | Admitting: *Deleted

## 2021-06-29 DIAGNOSIS — Z954 Presence of other heart-valve replacement: Secondary | ICD-10-CM | POA: Diagnosis not present

## 2021-06-29 DIAGNOSIS — I480 Paroxysmal atrial fibrillation: Secondary | ICD-10-CM

## 2021-06-29 DIAGNOSIS — Z5181 Encounter for therapeutic drug level monitoring: Secondary | ICD-10-CM

## 2021-06-29 LAB — POCT INR: INR: 5.2 — AB (ref 2.0–3.0)

## 2021-06-29 NOTE — Telephone Encounter (Signed)
Tried to reach the pt again though still unable to leave a message as vm is full. This has been our 3rd attempt. I will send out a letter to the pt to call the office for IN OFFICE appt. I will update the requesting office that we still have not been able to reach the pt . I will remove from the pre op call back pool.

## 2021-06-29 NOTE — Patient Instructions (Signed)
Description   Skip today's and tomorrow 's dosage of Warfarin, then start taking Warfarin 1.5 tablets daily. Stay consistent with greens each week (2-3 times per week). Recheck INR in 2 weeks. Call coumadin clinic for any changes in medications or up coming procedures. [phone].

## 2021-07-12 ENCOUNTER — Inpatient Hospital Stay: Payer: Medicare HMO | Attending: Gynecologic Oncology | Admitting: Gynecologic Oncology

## 2021-07-12 ENCOUNTER — Encounter: Payer: Self-pay | Admitting: Gynecologic Oncology

## 2021-07-12 ENCOUNTER — Ambulatory Visit (INDEPENDENT_AMBULATORY_CARE_PROVIDER_SITE_OTHER): Payer: Medicare HMO | Admitting: Pharmacist

## 2021-07-12 ENCOUNTER — Other Ambulatory Visit: Payer: Self-pay

## 2021-07-12 VITALS — BP 148/77 | HR 87 | Temp 98.6°F | Resp 16 | Ht 66.0 in | Wt 229.8 lb

## 2021-07-12 DIAGNOSIS — Z8542 Personal history of malignant neoplasm of other parts of uterus: Secondary | ICD-10-CM | POA: Diagnosis not present

## 2021-07-12 DIAGNOSIS — Z90722 Acquired absence of ovaries, bilateral: Secondary | ICD-10-CM | POA: Insufficient documentation

## 2021-07-12 DIAGNOSIS — I480 Paroxysmal atrial fibrillation: Secondary | ICD-10-CM | POA: Diagnosis not present

## 2021-07-12 DIAGNOSIS — Z90710 Acquired absence of both cervix and uterus: Secondary | ICD-10-CM | POA: Diagnosis not present

## 2021-07-12 DIAGNOSIS — Z923 Personal history of irradiation: Secondary | ICD-10-CM | POA: Diagnosis not present

## 2021-07-12 DIAGNOSIS — Z5181 Encounter for therapeutic drug level monitoring: Secondary | ICD-10-CM

## 2021-07-12 DIAGNOSIS — Z954 Presence of other heart-valve replacement: Secondary | ICD-10-CM

## 2021-07-12 DIAGNOSIS — Z9221 Personal history of antineoplastic chemotherapy: Secondary | ICD-10-CM | POA: Diagnosis not present

## 2021-07-12 DIAGNOSIS — C541 Malignant neoplasm of endometrium: Secondary | ICD-10-CM

## 2021-07-12 LAB — POCT INR: INR: 1.7 — AB (ref 2.0–3.0)

## 2021-07-12 NOTE — Patient Instructions (Signed)
It was good to see you today.  I do not see or feel any evidence of cancer recurrence on your exam.  Congratulations on being more than 2 years out from finishing treatment.  We can now transition to visits every 6 months, still alternating between my office and radiation oncology.  You are scheduled to see Dr. Sondra Come in October.  I had like to see you in March 2024.  Please call my office sometime after the new year to get that visit scheduled.  As always, if you develop new and concerning symptoms before your next visit, please call to see me sooner.

## 2021-07-12 NOTE — Patient Instructions (Signed)
Description   Take 2 tablets today and tomorrow and then continue taking Warfarin 1.5 tablets daily. Stay consistent with greens each week (2-3 times per week). Recheck INR in 2 weeks. Call coumadin clinic for any changes in medications or up coming procedures. [phone].

## 2021-07-12 NOTE — Progress Notes (Signed)
Gynecologic Oncology Return Clinic Visit  07/12/21  Reason for Visit: Follow-up in the setting of high risk uterine cancer  Treatment History: Oncology History Overview Note  Clear cell features MSI stable   Endometrial cancer ([place])  09/24/2018 Initial Diagnosis   She presented with post menopausal bleeding   11/20/2018 Initial Biopsy   EMB 11/10: G3 carcinoma with clear cell features.  Pap 11/10: AGC    12/03/2018 Surgery   TRH/BSO, bil SLNs   12/05/2018 Imaging   CT C/A/P:  1. Low attenuation mass or fluid in the endometrial cavity (series 2, image 110), in keeping with known endometrial malignancy. 2. Numerous small bilateral ground-glass pulmonary nodules. Although nonspecific these are likely infectious or inflammatory given appearance, isolated manifestation of metastatic disease much less favored. 3. Enlarged mediastinal and hilar lymph nodes, likely reactive given pulmonary findings. 4. No evidence of metastatic disease in the abdomen or pelvis. 5. Nonobstructive right nephrolithiasis. 6. Aortic Atherosclerosis (ICD10-I70.0).   01/02/2019 Pathologic Stage   Stage II Gr3 (clear cell features), no LVSI, cervial stroma involved, <50% MI MSI-low  A. 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            Tumor limited to upper half of myometrium.            Leiomyoma.            See oncology table.       Serosa: Fibrous adhesions with endosalpingiosis. No malignancy.  - Cervix: Stroma involved by tumor.  - Bilateral ovaries: Inclusion cysts. No malignancy.  - Bilateral fallopian tubes: Unremarkable. No malignancy.   B. LYMPH NODE, RIGHT OBTURATOR, SENTINEL, BIOPSY:  - One of one lymph nodes negative for carcinoma (0/1).   C. LYMPH NODE, LEFT EXTERNAL ILIAC, SENTINEL, BIOPSY:  - One of one lymph nodes negative for carcinoma  (0/1).   D. LYMPH NODE, LEFT EXTERNAL ILIAC, BIOPSY:  - One of one lymph nodes negative for carcinoma (0/1).   ONCOLOGY TABLE:   UTERUS, CARCINOMA OR CARCINOSARCOMA   Procedure: Total hysterectomy and bilateral salpingo-oophorectomy  Histologic type: Poorly differentiated carcinoma with clear cell  features, see comment.  Histologic Grade: High-grade  Myometrial invasion:       Depth of invasion:9 mm       Myometrial thickness: 20 mm  Uterine Serosa Involvement: Not identified  Cervical stromal involvement: Present  Extent of involvement of other organs: Not identified  Lymphovascular invasion: Not identified  Regional Lymph Nodes:       Examined:     3 Sentinel                               0 non-sentinel                               3 total        Lymph nodes with metastasis: 0        Isolated tumor cells (<0.2 mm): 0        Micrometastasis:  (>0.2 mm and < 2.0 mm): 0        Macrometastasis: (>2.0 mm): 0        Extracapsular extension: Not applicable  Representative Tumor Block: A5  MMR / MSI testing: Will be ordered  Pathologic  Stage Classification (pTNM, AJCC 8th edition):  pT2, pNX  Comments: The tumor consists of solid sheets and more tubulocystic areas with focal areas of cytoplasmic clearing. Immunohistochemistry is positive for racemase (weak), Napsin-A (focal), and p53 (wild type). ER and PR are negative. While extensive clear cell changes are not seen in the more solid components, the immunoprofile along with more definitive areas of clear cell changes favor the overall tumor to be a clear cell carcinoma. The tumor has a more pushing type of invasion. There is stromal invasion in areas of endocervical mucosa. Immunohistochemistry on the serosa adhesions reveals mesothelial cells (calretinin) and a small focus of endosalpingiosis (PAX-8, ER, MOC31). Pancytokeratin on the lymph nodes is negative.    01/18/2019 Cancer Staging   Staging form: Corpus Uteri - Carcinoma and  Carcinosarcoma, AJCC 8th Edition - Pathologic: Stage II (pT2, pN0, cM0) - Signed by Heath Lark, MD on 01/18/2019   02/04/2019 - 05/21/2019 Chemotherapy   The patient had carboplatin and taxol   03/18/2019 - 04/15/2019 Radiation Therapy    Vagina: Pelvis HDR-brachy 30/30 6 5/5 Ir-192      06/20/2019 Imaging   IMPRESSION: 1. Interval hysterectomy. No abdominopelvic adenopathy or evidence of metastatic disease. 2. Relatively similar appearance of the chest since 12/05/2018. Mosaic attenuation throughout the lungs which could be related to air trapping or heterogeneous ground-glass. Given cardiomegaly and extensive postsurgical changes of mitral and tricuspid valve repair, ground-glass as can be seen with mild pulmonary edema is considered. 3. Similar thoracic adenopathy, favored to be reactive or related to a component of congestive heart failure. 4. Increased size of small left axillary nodes, possibly related to recent COVID-19 vaccine. Correlate with clinical history. 5. A subtle right upper lobe pulmonary nodule is felt to be similar in size but slightly more distinct in appearance today. Recommend attention on follow-up. 6. Right nephrolithiasis. 7. Pulmonary artery enlargement suggests pulmonary arterial hypertension. 8. Cholelithiasis.     Interval History: She last saw Dr. Sondra Come in 04/2021.  Patient reports she is doing very well.  Continues to have some back pain, follows with her primary care provider for this.  Patient denies any vaginal bleeding or discharge.  She reports normal bowel and bladder function.  She endorses a good appetite without nausea or emesis.  She denies any abdominal or pelvic pain.   Past Medical/Surgical History: Past Medical History:  Diagnosis Date   Abnormal chest CT    Aortic atherosclerosis (HCC)    Asthma    Atrial enlargement, left    severe   Atrial fibrillation (HCC) 01/05/2011   Chronic persistent, failed DCCV    Atrial flutter (HCC)     Bronchospasm 1998   Cardiomegaly    Carotid bruit    Chronic diastolic congestive heart failure (HCC)    Diabetes mellitus    insulin dependent   Ejection fraction < 50%    35-40%,    Heart murmur    History of blood transfusion    History of radiation therapy 03/18/19-04/15/19   endometrial - vaginal brachytherapy -  Dr. Sondra Come    Hypercholesterolemia    Hypertension    Junctional rhythm 09/14/2018   Noted on EKG   Left bundle branch block (LBBB) 09/14/2018   Noted on EKG   LVH (left ventricular hypertrophy) 10/10/2016   Moderate, Noted on ECHO   Mitral regurgitation    Obesity    Obesity (BMI 30-39.9) 01/16/2012   Persistent atrial fibrillation (HCC)    Polyp of rectum  Rheumatic fever 01/16/2012   Reported during childhood   S/P Maze operation for atrial fibrillation 02/15/2012   Complete biatrial lesion set using bipolar radiofrequency and cryothermy ablation with clipping of LA appendage   S/P mitral valve replacement with metallic valve 03/13/1960   6m Sorin [employer] Optiform mechanical prosthesis   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   246mEdwards mc3 ring annuloplasty   Shortness of breath    with exertion   Sleep apnea    DOES NOT HAVE CPAP   [place]   Post op , left arm weakness   Tricuspid regurgitation 01/16/2012   [facility]    Past Surgical History:  Procedure Laterality Date   CARDIAC CATHETERIZATION  >5 years   CARDIOVASCULAR STRESS TEST  09/2010   CARDIOVERSION  03/25/2011   Procedure: CARDIOVERSION;  Surgeon: JaJettie BoozeMD;  [facility]: Cardiovascular;  Laterality: N/A;   CARDIOVERSION N/A 07/27/2012   Procedure: CARDIOVERSION;  Surgeon: JaJettie BoozeMD;  [facility]: Cardiovascular;  Laterality: N/A;   CESAREAN SECTION     COLONOSCOPY WITH PROPOFOL N/A 04/27/2016   Procedure: COLONOSCOPY WITH PROPOFOL;  Surgeon: ViWilford CornerMD;  [facility];  Service:  Endoscopy;  Laterality: N/A;   INTRAOPERATIVE TRANSESOPHAGEAL ECHOCARDIOGRAM  02/15/2012   Procedure: INTRAOPERATIVE TRANSESOPHAGEAL ECHOCARDIOGRAM;  Surgeon: ClRexene AlbertsMD;  [place] [facility] Service: Open Heart Surgery;  Laterality: N/A;   MAZE  02/15/2012   Procedure: MAZE;  Surgeon: ClRexene AlbertsMD;  [place] [facility] Service: Open Heart Surgery;  Laterality: N/A;   MITRAL VALVE REPLACEMENT  02/15/2012   Procedure: MITRAL VALVE (MV) REPLACEMENT;  Surgeon: ClRexene AlbertsMD;  [facility]: Open Heart Surgery;  Laterality: N/A;   ROBOTIC ASSISTED LAPAROSCOPIC HYSTERECTOMY AND SALPINGECTOMY Bilateral 01/02/2019   Procedure: XI ROBOTIC ASSISTED LAPAROSCOPIC TOTAL HYSTERECTOMY WITH BILATERAL SALPINGOOPHORECTOMY, SENTINEL LYMPH NODE BIOPSY;  Surgeon: TuLafonda MossesMD;  Location: [facility];  Service: Gynecology;  Laterality: Bilateral;   TEE WITHOUT CARDIOVERSION  12/21/2011   Procedure: TRANSESOPHAGEAL ECHOCARDIOGRAM (TEE);  Surgeon: JaJettie BoozeMD;  [facility];  Service: Cardiovascular;  Laterality: N/A;   TRICUSPID VALVE REPLACEMENT  02/15/2012   Procedure: TRICUSPID VALVE REPAIR;  Surgeon: ClRexene AlbertsMD;  [place] [facility] Service: Open Heart Surgery;  Laterality: N/A;   TUBAL LIGATION     at time of her c-section    Family History  Problem Relation Age of Onset   Asthma Mother    Diabetes Mother    Hypertension Mother    Hypertension Brother    Heart attack Neg Hx    Stroke Neg Hx    Colon cancer Neg Hx    Colon polyps Neg Hx    Liver disease Neg Hx    Uterine cancer Neg Hx    Ovarian cancer Neg Hx    Breast cancer Neg Hx     Social History   Socioeconomic History   Marital status: Married    Spouse name: Not on file   Number of children: Not on file   Years of education: Not on file   Highest education level: Not on file  Occupational History   Occupation: laConservation officer, nature  Employer: [employer]  Tobacco Use    Smoking status: Never   Smokeless tobacco: Never  Vaping Use   Vaping Use: Never used  Substance and Sexual Activity  Alcohol use: No   Drug use: No   Sexual activity: Yes    Birth control/protection: Post-menopausal  Other Topics Concern   Not on file  Social History Narrative   Pt lives in [place] with spouse.  Works at [employer]. 2 grown children, 1 grandchild   Social Determinants of Radio broadcast assistant Strain: Not on [employer] Insecurity: Not on file  Transportation Needs: Not on file  Physical Activity: Not on file  Stress: Not on file  Social Connections: Not on file    Current Medications:  Current Outpatient Medications:    alendronate (FOSAMAX) 70 MG tablet, Take 70 mg by mouth once a week. Take with a full glass of water on an empty stomach., Disp: , Rfl:    amiodarone (PACERONE) 200 MG tablet, TAKE 1 TABLET(200 MG) BY MOUTH DAILY, Disp: 90 tablet, Rfl: 2   amLODipine (NORVASC) 10 MG tablet, TAKE 1 TABLET EVERY DAY (DOSE INCREASE), Disp: 90 tablet, Rfl: 1   atorvastatin (LIPITOR) 40 MG tablet, TAKE 1 TABLET(40 MG) BY MOUTH DAILY, Disp: 90 tablet, Rfl: 2   calcium carbonate (OS-CAL - DOSED IN MG OF ELEMENTAL CALCIUM) 1250 MG tablet, Take 1 tablet by mouth daily., Disp: , Rfl:    carvedilol (COREG) 12.5 MG tablet, TAKE 1 TABLET TWICE DAILY WITH A MEAL, Disp: 180 tablet, Rfl: 2   Cholecalciferol (VITAMIN D) 1000 UNITS capsule, Take 1,000 Units by mouth daily., Disp: , Rfl:    cyclobenzaprine (FLEXERIL) 10 MG tablet, Take 10 mg by mouth daily. , Disp: , Rfl:    dexamethasone (DECADRON) 4 MG tablet, Take 2 tabs at the night before chemotherapy, every 3 weeks, by mouth x 6 cycles, please dispense 12 tabs, Disp: 60 tablet, Rfl: 0   insulin glargine (LANTUS) 100 UNIT/ML injection, Inject 30-35 Units into the skin See admin instructions. Inject 30 units into the skin in the morning and 35 units at bedtime., Disp: , Rfl:    irbesartan (AVAPRO) 300  MG tablet, TAKE 1 TABLET(300 MG) BY MOUTH DAILY, Disp: 90 tablet, Rfl: 2   metFORMIN (GLUCOPHAGE) 500 MG tablet, Take 500 mg by mouth 2 (two) times daily with a meal. , Disp: , Rfl:    methocarbamol (ROBAXIN) 500 MG tablet, Take 500 mg by mouth every 8 (eight) hours as needed for muscle spasms. , Disp: , Rfl:    senna (SENOKOT) 8.6 MG TABS tablet, Take 1 tablet (8.6 mg total) by mouth daily as needed for up to 15 doses for mild constipation., Disp: 15 tablet, Rfl: 0   warfarin (COUMADIN) 4 MG tablet, TAKE 1 AND 1/2 TABLETS TO 2 TABLETS BY MOUTH DAILY AS DIRECTED BY COUMADIN CLINIC, Disp: 180 tablet, Rfl: 1  Review of Systems: Denies appetite changes, fevers, chills, fatigue, unexplained weight changes. Denies hearing loss, neck lumps or masses, mouth sores, ringing in ears or voice changes. Denies cough or wheezing.  Denies shortness of breath. Denies chest pain or palpitations. Denies leg swelling. Denies abdominal distention, pain, blood in stools, constipation, diarrhea, nausea, vomiting, or early satiety. Denies pain with intercourse, dysuria, frequency, hematuria or incontinence. Denies hot flashes, pelvic pain, vaginal bleeding or vaginal discharge.   Denies joint pain or muscle pain/cramps. Denies itching, rash, or wounds. Denies dizziness, headaches, numbness or seizures. Denies swollen lymph nodes or glands, denies easy bruising or bleeding. Denies anxiety, depression, confusion, or decreased concentration.  Physical Exam: BP (!) 148/77 (BP Location: Right Arm, Patient Position: Sitting)   Pulse  87   Temp 98.6 F (37 C) (Tympanic)   Resp 16   Ht 5' 6" (1.676 m)   Wt 229 lb 12.8 oz (104.2 kg)   SpO2 98%   BMI 37.09 kg/m  General: Alert, oriented, no acute distress. HEENT: Normocephalic, atraumatic, sclera anicteric. Chest: Clear to auscultation bilaterally.  No wheezes or rhonchi. Cardiovascular: Regular rate and rhythm, no murmurs. Abdomen: Obese, soft, nontender.   Normoactive bowel sounds.  No masses or hepatosplenomegaly appreciated.  well-healed incisions. Extremities: Grossly normal range of motion.  Warm, well perfused.  No edema bilaterally. Skin: No rashes or lesions noted. Lymphatics: No cervical, supraclavicular, or inguinal adenopathy. GU: Normal appearing external genitalia without erythema, excoriation, or lesions.  Speculum exam reveals moderately atrophic vaginal mucosa, with radiation changes evident.  No bleeding or discharge no masses noted.  Bimanual exam reveals no nodularity or masses.  Rectovaginal exam confirms findings.  Laboratory & Radiologic Studies: None new  Assessment & Plan: [name] is a 66 y.o. 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 Adjuvant platinum-based chemotherapy and vaginal brachytherapy completed in 05/2019.   The continues to be NED on exam today.  She is overall doing quite well without any lasting sequelae from treatment.   Per NCCN and SGO surveillance recommendations, in the setting of high risk endometrial cancer, we will transition to surveillance visits every 6 months since she is now more than 2 years out from treatment.  She is seeing Dr Sondra Come in October.  I have asked her to call back after the new year to schedule a visit to see me in March 2024.   We discussed the signs and symptoms that would be concerning for disease recurrence and should prompt a phone call prior to her next visit.    23 minutes of total time was spent for this patient encounter, including preparation, face-to-face counseling with the patient and coordination of care, and documentation of the encounter.  Jeral Pinch, MD  Division of Gynecologic Oncology  Department of Obstetrics and Gynecology  [facility]

## 2021-07-15 ENCOUNTER — Other Ambulatory Visit: Payer: Self-pay | Admitting: Interventional Cardiology

## 2021-07-15 DIAGNOSIS — I10 Essential (primary) hypertension: Secondary | ICD-10-CM

## 2021-08-02 ENCOUNTER — Ambulatory Visit (INDEPENDENT_AMBULATORY_CARE_PROVIDER_SITE_OTHER): Payer: Medicare HMO

## 2021-08-02 DIAGNOSIS — I480 Paroxysmal atrial fibrillation: Secondary | ICD-10-CM | POA: Diagnosis not present

## 2021-08-02 DIAGNOSIS — Z5181 Encounter for therapeutic drug level monitoring: Secondary | ICD-10-CM | POA: Diagnosis not present

## 2021-08-02 DIAGNOSIS — Z954 Presence of other heart-valve replacement: Secondary | ICD-10-CM | POA: Diagnosis not present

## 2021-08-02 LAB — POCT INR: INR: 3.6 — AB (ref 2.0–3.0)

## 2021-08-02 NOTE — Patient Instructions (Signed)
continue taking Warfarin 1.5 tablets daily. Stay consistent with greens each week (2-3 times per week). Recheck INR in 2 weeks. Call coumadin clinic for any changes in medications or up coming procedures. [phone]. EAT GREENS TONIGHT.

## 2021-08-09 NOTE — Progress Notes (Unsigned)
  Cardiology Office Note   Date:  08/10/2021   ID:  [name], DOB 05/30/1955, MRN [id number]  PCP:  [name], [name], PA    No chief complaint on file.  AFib, MVR  Wt Readings from Last 3 Encounters:  08/10/21 234 lb (106.1 kg)  07/12/21 229 lb 12.8 oz (104.2 kg)  04/19/21 238 lb 12.8 oz (108.3 kg)       History of Present Illness: [name] is a 65 y.o. female  who had a mitral valve replacement and TV repair in 2014.  This was complicated by a post op CVA. She has had  atrial flutter. Dr. Allred recommended amio followed by cardioversion. She had the cardioversion in 07/2012.    2013 cath showed: The left main coronary artery is angiographically normal.   The left anterior descending artery is angiographically normal.  There is a medium sized diagonal which is widely patent.   The left circumflex artery is angiographically normal.  There is a large ramus vessel which is widely patent.  Large OM1 is angiographically normal.   The right coronary artery is a large dominant vessel which is angiographically normal.   In 2021, "She is on disability at this point.  She has some trouble going up stairs, and walking some distance even on flat ground.     Coumadin had been difficult to regulate.    Easier to regulate now, typically 3-5 weeks check.   She was diagnosed with uterine cancer and had surgery. She did well."   She has gained weight in 2021-22 as she was  Not walking as much.  Uterine cancer treatment was finished.   Now she needs oral surgery with 4 teeth to be removed.  She is here for cardiac clearance.    Denies : Chest pain. Dizziness. Leg edema. Nitroglycerin use. Orthopnea. Palpitations. Paroxysmal nocturnal dyspnea. Syncope.    No cardiac sx with walking. Last echo in 2018.    Past Medical History:  Diagnosis Date   Abnormal chest CT    Aortic atherosclerosis (HCC)    Asthma    Atrial enlargement, left    severe   Atrial fibrillation (HCC)  01/05/2011   Chronic persistent, failed DCCV    Atrial flutter (HCC)    Bronchospasm 1998   Cardiomegaly    Carotid bruit    Chronic diastolic congestive heart failure (HCC)    Diabetes mellitus    insulin dependent   Ejection fraction < 50%    35-40%,    Heart murmur    History of blood transfusion    History of radiation therapy 03/18/19-04/15/19   endometrial - vaginal brachytherapy -  Dr. Kinard    Hypercholesterolemia    Hypertension    Junctional rhythm 09/14/2018   Noted on EKG   Left bundle branch block (LBBB) 09/14/2018   Noted on EKG   LVH (left ventricular hypertrophy) 10/10/2016   Moderate, Noted on ECHO   Mitral regurgitation    Obesity    Obesity (BMI 30-39.9) 01/16/2012   Persistent atrial fibrillation (HCC)    Polyp of rectum    Rheumatic fever 01/16/2012   Reported during childhood   S/P Maze operation for atrial fibrillation 02/15/2012   Complete biatrial lesion set using bipolar radiofrequency and cryothermy ablation with clipping of LA appendage   S/P mitral valve replacement with metallic valve 02/15/2012   31mm Sorin [employer]omed[employer] Optiform mechanical prosthesis   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   28mm Edwards mc3 ring   annuloplasty   Shortness of breath    with exertion   Sleep apnea    DOES NOT HAVE CPAP   Stroke [facility]) 2014   Post op , left arm weakness   Tricuspid regurgitation 01/16/2012   Uterine cancer [facility])     Past Surgical History:  Procedure Laterality Date   CARDIAC CATHETERIZATION  >5 years   CARDIOVASCULAR STRESS TEST  09/2010   CARDIOVERSION  03/25/2011   Procedure: CARDIOVERSION;  Surgeon: Jettie Booze, MD;  Location: [place];  Service: Cardiovascular;  Laterality: N/A;   CARDIOVERSION N/A 07/27/2012   Procedure: CARDIOVERSION;  Surgeon: Jettie Booze, MD;  Location: [place];  Service: Cardiovascular;  Laterality: N/A;   CESAREAN SECTION     COLONOSCOPY WITH PROPOFOL N/A 04/27/2016   Procedure: COLONOSCOPY WITH  PROPOFOL;  Surgeon: Wilford Corner, MD;  Location: [facility] ENDOSCOPY;  Service: Endoscopy;  Laterality: N/A;   INTRAOPERATIVE TRANSESOPHAGEAL ECHOCARDIOGRAM  02/15/2012   Procedure: INTRAOPERATIVE TRANSESOPHAGEAL ECHOCARDIOGRAM;  Surgeon: Rexene Alberts, MD;  Location: [place];  Service: Open Heart Surgery;  Laterality: N/A;   MAZE  02/15/2012   Procedure: MAZE;  Surgeon: Rexene Alberts, MD;  Location: [place];  Service: Open Heart Surgery;  Laterality: N/A;   MITRAL VALVE REPLACEMENT  02/15/2012   Procedure: MITRAL VALVE (MV) REPLACEMENT;  Surgeon: Rexene Alberts, MD;  Location: [place];  Service: Open Heart Surgery;  Laterality: N/A;   ROBOTIC ASSISTED LAPAROSCOPIC HYSTERECTOMY AND SALPINGECTOMY Bilateral 01/02/2019   Procedure: XI ROBOTIC ASSISTED LAPAROSCOPIC TOTAL HYSTERECTOMY WITH BILATERAL SALPINGOOPHORECTOMY, SENTINEL LYMPH NODE BIOPSY;  Surgeon: [name], MD;  Location: [facility];  Service: Gynecology;  Laterality: Bilateral;   TEE WITHOUT CARDIOVERSION  12/21/2011   Procedure: TRANSESOPHAGEAL ECHOCARDIOGRAM (TEE);  Surgeon: Jettie Booze, MD;  Location: [facility] ENDOSCOPY;  Service: Cardiovascular;  Laterality: N/A;   TRICUSPID VALVE REPLACEMENT  02/15/2012   Procedure: TRICUSPID VALVE REPAIR;  Surgeon: Rexene Alberts, MD;  Location: [place];  Service: Open Heart Surgery;  Laterality: N/A;   TUBAL LIGATION     at time of her c-section     Current Outpatient Medications  Medication Sig Dispense Refill   acetaminophen (TYLENOL) 500 MG tablet as needed for pain.     albuterol (PROVENTIL HFA) 108 (90 Base) MCG/ACT inhaler as needed for shortness of breath.     alendronate (FOSAMAX) 70 MG tablet Take 70 mg by mouth once a week. Take with a full glass of water on an empty stomach.     amiodarone (PACERONE) 200 MG tablet TAKE 1 TABLET(200 MG) BY MOUTH DAILY 90 tablet 2   amLODipine (NORVASC) 10 MG tablet TAKE 1 TABLET EVERY DAY 90 tablet 0   atorvastatin (LIPITOR) 40 MG tablet TAKE 1 TABLET(40  MG) BY MOUTH DAILY 90 tablet 2   calcium carbonate (OS-CAL - DOSED IN MG OF ELEMENTAL CALCIUM) 1250 MG tablet Take 1 tablet by mouth daily.     carvedilol (COREG) 12.5 MG tablet TAKE 1 TABLET TWICE DAILY WITH A MEAL 180 tablet 2   Cholecalciferol (VITAMIN D) 1000 UNITS capsule Take 1,000 Units by mouth daily.     cyclobenzaprine (FLEXERIL) 10 MG tablet Take 10 mg by mouth daily.      dexamethasone (DECADRON) 4 MG tablet Take 2 tabs at the night before chemotherapy, every 3 weeks, by mouth x 6 cycles, please dispense 12 tabs 60 tablet 0   furosemide (LASIX) 40 MG tablet daily at 6 (six) AM.  insulin glargine (LANTUS) 100 UNIT/ML injection Inject 30-35 Units into the skin See admin instructions. Inject 30 units into the skin in the morning and 35 units at bedtime.     irbesartan (AVAPRO) 300 MG tablet TAKE 1 TABLET EVERY DAY 90 tablet 0   metFORMIN (GLUCOPHAGE) 500 MG tablet Take 500 mg by mouth 2 (two) times daily with a meal.      methocarbamol (ROBAXIN) 500 MG tablet Take 500 mg by mouth every 8 (eight) hours as needed for muscle spasms.      potassium chloride (KLOR-CON) 10 MEQ tablet daily at 6 (six) AM.     senna (SENOKOT) 8.6 MG TABS tablet Take 1 tablet (8.6 mg total) by mouth daily as needed for up to 15 doses for mild constipation. 15 tablet 0   warfarin (COUMADIN) 4 MG tablet TAKE 1 AND 1/2 TABLETS TO 2 TABLETS BY MOUTH DAILY AS DIRECTED BY COUMADIN CLINIC 180 tablet 1   No current facility-administered medications for this visit.    Allergies:   Chlorhexidine    Social History:  The patient  reports that she has never smoked. She has never used smokeless tobacco. She reports that she does not drink alcohol and does not use drugs.   Family History:  The patient's family history includes Asthma in her mother; Diabetes in her mother; Hypertension in her brother and mother.    ROS:  Please see the history of present illness.   Otherwise, review of systems are positive for DOE.    All other systems are reviewed and negative.    PHYSICAL EXAM: VS:  BP 132/78   Pulse 74   Ht 5' 6" (1.676 m)   Wt 234 lb (106.1 kg)   SpO2 96%   BMI 37.77 kg/m  , BMI Body mass index is 37.77 kg/m. GEN: Well nourished, well developed, in no acute distress HEENT: normal Neck: no JVD, carotid bruits, or masses Cardiac: RRR; crisp S1 click, soft early systolic murmur, no rubs, or gallops,no edema  Respiratory:  clear to auscultation bilaterally, normal work of breathing GI: soft, nontender, nondistended, + BS MS: no deformity or atrophy; midline sternal scar Skin: warm and dry, no rash Neuro:  Strength and sensation are intact Psych: euthymic mood, full affect   EKG:   The ekg ordered today demonstrates AFib, LBBB   Recent Labs: No results found for requested labs within last 365 days.   Lipid Panel    Component Value Date/Time   CHOL 152 05/28/2015 0958   TRIG 108 05/28/2015 0958   HDL 35 (L) 05/28/2015 0958   CHOLHDL 4.3 05/28/2015 0958   VLDL 22 05/28/2015 0958   LDLCALC 95 05/28/2015 0958     Other studies Reviewed: Additional studies/ records that were reviewed today with results demonstrating: Labs reviewed, LDL 61 in August 2022.   ASSESSMENT AND PLAN:  S/p MVR/preoperative cardiovascular exam: SBE prophylaxis required, especially for upcoming dental procedure. WiIl have to hold Coumadin and use Lovenox bridge per pharmacy.  We will check echo but no further cardiac testing needed prior to dental procedure.  Given history of low EF, would try to avoid excessive IV fluids to prevent heart failure. PAF: Sinus rhythm maintained on amiodarone.  She needs routine follow-up with TSH and LFTs.  She will need periodic chest x-rays as well. Chronic systolic heart failure: We will check echocardiogram since it has been about 5 years since her last echo..  Appears euvolemic today. Hypertension: The current medical regimen is  effective;  continue present plan and  medications. Anticoagulated: We will coordinate with Pharm.D. for Lovenox bridge.  No bleeding issues currently.  Check CBC along with c-Met and lipids today. Hyperlipidemia: Continue atorvastatin.  Check lipids today.   Current medicines are reviewed at length with the patient today.  The patient concerns regarding her medicines were addressed.  The following changes have been made:  No change  Labs/ tests ordered today include:  No orders of the defined types were placed in this encounter.   Recommend 150 minutes/week of aerobic exercise Low fat, low carb, high fiber diet recommended  Disposition:   FU in 6 months   Signed, Jayadeep Varanasi, MD  08/10/2021 8:22 AM    [facility] [address], [place], [place]  [address] Phone: [phone]; Fax: [phone]   

## 2021-08-10 ENCOUNTER — Ambulatory Visit: Payer: Medicare HMO | Admitting: Interventional Cardiology

## 2021-08-10 ENCOUNTER — Encounter: Payer: Self-pay | Admitting: Interventional Cardiology

## 2021-08-10 VITALS — BP 132/78 | HR 74 | Ht 66.0 in | Wt 234.0 lb

## 2021-08-10 DIAGNOSIS — Z954 Presence of other heart-valve replacement: Secondary | ICD-10-CM

## 2021-08-10 DIAGNOSIS — Z7901 Long term (current) use of anticoagulants: Secondary | ICD-10-CM | POA: Diagnosis not present

## 2021-08-10 DIAGNOSIS — I480 Paroxysmal atrial fibrillation: Secondary | ICD-10-CM | POA: Diagnosis not present

## 2021-08-10 DIAGNOSIS — I10 Essential (primary) hypertension: Secondary | ICD-10-CM

## 2021-08-10 DIAGNOSIS — I4819 Other persistent atrial fibrillation: Secondary | ICD-10-CM

## 2021-08-10 DIAGNOSIS — I519 Heart disease, unspecified: Secondary | ICD-10-CM

## 2021-08-10 DIAGNOSIS — Z01810 Encounter for preprocedural cardiovascular examination: Secondary | ICD-10-CM

## 2021-08-10 DIAGNOSIS — E7800 Pure hypercholesterolemia, unspecified: Secondary | ICD-10-CM | POA: Diagnosis not present

## 2021-08-10 LAB — CBC
Hematocrit: 35.9 % (ref 34.0–46.6)
Hemoglobin: 11.9 g/dL (ref 11.1–15.9)
MCH: 31.6 pg (ref 26.6–33.0)
MCHC: 33.1 g/dL (ref 31.5–35.7)
MCV: 95 fL (ref 79–97)
Platelets: 165 10*3/uL (ref 150–450)
RBC: 3.77 x10E6/uL (ref 3.77–5.28)
RDW: 12.7 % (ref 11.7–15.4)
WBC: 5.9 10*3/uL (ref 3.4–10.8)

## 2021-08-10 LAB — COMPREHENSIVE METABOLIC PANEL
ALT: 17 IU/L (ref 0–32)
AST: 22 IU/L (ref 0–40)
Albumin/Globulin Ratio: 1.2 (ref 1.2–2.2)
Albumin: 4.6 g/dL (ref 3.9–4.9)
Alkaline Phosphatase: 110 IU/L (ref 44–121)
BUN/Creatinine Ratio: 20 (ref 12–28)
BUN: 19 mg/dL (ref 8–27)
Bilirubin Total: 0.5 mg/dL (ref 0.0–1.2)
CO2: 27 mmol/L (ref 20–29)
Calcium: 9.5 mg/dL (ref 8.7–10.3)
Chloride: 101 mmol/L (ref 96–106)
Creatinine, Ser: 0.95 mg/dL (ref 0.57–1.00)
Globulin, Total: 3.8 g/dL (ref 1.5–4.5)
Glucose: 134 mg/dL — ABNORMAL HIGH (ref 70–99)
Potassium: 4.1 mmol/L (ref 3.5–5.2)
Sodium: 140 mmol/L (ref 134–144)
Total Protein: 8.4 g/dL (ref 6.0–8.5)
eGFR: 66 mL/min/{1.73_m2} (ref >59)

## 2021-08-10 LAB — LIPID PANEL
Chol/HDL Ratio: 4.7 ratio — ABNORMAL HIGH (ref 0.0–4.4)
Cholesterol, Total: 166 mg/dL (ref 100–199)
HDL: 35 mg/dL — ABNORMAL LOW (ref >39)
LDL Chol Calc (NIH): 111 mg/dL — ABNORMAL HIGH (ref 0–99)
Triglycerides: 110 mg/dL (ref 0–149)
VLDL Cholesterol Cal: 20 mg/dL (ref 5–40)

## 2021-08-10 NOTE — Patient Instructions (Signed)
Medication Instructions:  Your physician recommends that you continue on your current medications as directed. Please refer to the Current Medication list given to you today.  *If you need a refill on your cardiac medications before your next appointment, please call your pharmacy*   Lab Work: Lab work to be done today--CMET, CBC, Lipids If you have labs (blood work) drawn today and your tests are completely normal, you will receive your results only by: [place] (if you have MyChart) OR A paper copy in the mail If you have any lab test that is abnormal or we need to change your treatment, we will call you to review the results.   Testing/Procedures: Your physician has requested that you have an echocardiogram. Echocardiography is a painless test that uses sound waves to create images of your heart. It provides your doctor with information about the size and shape of your heart and how well your heart's chambers and valves are working. This procedure takes approximately one hour. There are no restrictions for this procedure.    Follow-Up: At [facility], you and your health needs are our priority.  As part of our continuing mission to provide you with exceptional heart care, we have created designated Provider Care Teams.  These Care Teams include your primary Cardiologist (physician) and Advanced Practice Providers (APPs -  Physician Assistants and Nurse Practitioners) who all work together to provide you with the care you need, when you need it.  We recommend signing up for the patient portal called "MyChart".  Sign up information is provided on this After Visit Summary.  MyChart is used to connect with patients for Virtual Visits (Telemedicine).  Patients are able to view lab/test results, encounter notes, upcoming appointments, etc.  Non-urgent messages can be sent to your provider as well.   To learn more about what you can do with MyChart, go to [email or web address].     Your next appointment:   6 month(s)  The format for your next appointment:   In Person  Provider:   Larae Grooms, MD     Other Instructions    Important Information About Sugar

## 2021-08-20 ENCOUNTER — Ambulatory Visit (INDEPENDENT_AMBULATORY_CARE_PROVIDER_SITE_OTHER): Payer: Medicare HMO

## 2021-08-20 DIAGNOSIS — Z954 Presence of other heart-valve replacement: Secondary | ICD-10-CM | POA: Diagnosis not present

## 2021-08-20 DIAGNOSIS — Z5181 Encounter for therapeutic drug level monitoring: Secondary | ICD-10-CM

## 2021-08-20 DIAGNOSIS — I480 Paroxysmal atrial fibrillation: Secondary | ICD-10-CM | POA: Diagnosis not present

## 2021-08-20 LAB — POCT INR: INR: 2.7 (ref 2.0–3.0)

## 2021-08-20 NOTE — Patient Instructions (Signed)
Description    Continue taking Warfarin 1.5 tablets daily. Stay consistent with greens each week (2-3 times per week). Recheck INR in 3 weeks. Call coumadin clinic for any changes in medications or up coming procedures. [phone]. EAT GREENS TONIGHT.

## 2021-08-24 ENCOUNTER — Ambulatory Visit (HOSPITAL_COMMUNITY): Payer: Medicare HMO | Attending: Cardiovascular Disease

## 2021-08-24 DIAGNOSIS — I519 Heart disease, unspecified: Secondary | ICD-10-CM | POA: Insufficient documentation

## 2021-08-24 DIAGNOSIS — I059 Rheumatic mitral valve disease, unspecified: Secondary | ICD-10-CM | POA: Diagnosis not present

## 2021-08-24 DIAGNOSIS — I10 Essential (primary) hypertension: Secondary | ICD-10-CM | POA: Insufficient documentation

## 2021-08-24 DIAGNOSIS — I480 Paroxysmal atrial fibrillation: Secondary | ICD-10-CM | POA: Insufficient documentation

## 2021-08-24 DIAGNOSIS — Z954 Presence of other heart-valve replacement: Secondary | ICD-10-CM | POA: Insufficient documentation

## 2021-08-24 LAB — ECHOCARDIOGRAM COMPLETE
Area-P 1/2: 3.68 cm2
MV VTI: 1.13 cm2
P 1/2 time: 649 msec
S' Lateral: 2.8 cm

## 2021-09-09 ENCOUNTER — Ambulatory Visit: Payer: Medicare HMO | Attending: Interventional Cardiology | Admitting: *Deleted

## 2021-09-09 DIAGNOSIS — I480 Paroxysmal atrial fibrillation: Secondary | ICD-10-CM | POA: Diagnosis not present

## 2021-09-09 DIAGNOSIS — Z954 Presence of other heart-valve replacement: Secondary | ICD-10-CM

## 2021-09-09 DIAGNOSIS — Z5181 Encounter for therapeutic drug level monitoring: Secondary | ICD-10-CM

## 2021-09-09 LAB — POCT INR: INR: 2.8 (ref 2.0–3.0)

## 2021-09-09 NOTE — Patient Instructions (Addendum)
Description   Continue taking Warfarin 1.5 tablets daily. Stay consistent with greens each week (2-3 times per week). Recheck INR in 4 weeks. Call coumadin clinic for any changes in medications or up coming procedures. [phone].

## 2021-09-26 ENCOUNTER — Other Ambulatory Visit: Payer: Self-pay | Admitting: Interventional Cardiology

## 2021-10-07 ENCOUNTER — Ambulatory Visit: Payer: Medicare HMO | Attending: Cardiovascular Disease

## 2021-10-07 DIAGNOSIS — I480 Paroxysmal atrial fibrillation: Secondary | ICD-10-CM | POA: Diagnosis not present

## 2021-10-07 DIAGNOSIS — Z5181 Encounter for therapeutic drug level monitoring: Secondary | ICD-10-CM

## 2021-10-07 DIAGNOSIS — Z954 Presence of other heart-valve replacement: Secondary | ICD-10-CM

## 2021-10-07 LAB — POCT INR: INR: 1.8 — AB (ref 2.0–3.0)

## 2021-10-07 NOTE — Patient Instructions (Signed)
TAKE 2.5 TABLETS TODAY ONLY and then Continue taking Warfarin 1.5 tablets daily. Stay consistent with greens each week (2-3 times per week). Recheck INR in 3 weeks. Call coumadin clinic for any changes in medications or up coming procedures. [phone]. Dental procedure 11/10, will need Lovenox Bridge.

## 2021-10-12 ENCOUNTER — Other Ambulatory Visit: Payer: Self-pay | Admitting: Physician Assistant

## 2021-10-12 DIAGNOSIS — Z1231 Encounter for screening mammogram for malignant neoplasm of breast: Secondary | ICD-10-CM

## 2021-10-20 NOTE — Progress Notes (Signed)
Radiation Oncology         [phone] ________________________________  Name: [name] MRN: [id number]  Date: 10/21/2021  DOB: Mar 03, 1955  Follow-Up Visit Note  CC: [name], [name], PA  [name], [place], [name]    ICD-10-CM   1. Malignant neoplasm of endometrium ([place])  C54.1     2. Endometrial cancer (HCC)  C54.1       Diagnosis:   Stage II endometrial cancer, high-grade  Interval Since Last Radiation: 2 years, 6 months, and 1 week  Radiation Treatment Dates: 03/18/2019 through 04/15/2019 Site Technique Total Dose (Gy) Dose per Fx (Gy) Completed Fx Beam Energies  Vagina: Pelvis HDR-brachy 30/30 6 5/5 Ir-192    Narrative:  The patient returns today for routine 6 month follow-up, she was last seen here for follow-up on 04/19/21. Since her last visit, the patient followed up with Dr. Berline Lopes on 07/12/21. During which time, the patient denied any symptoms concerning for disease recurrence and was noted as NED on examination. She did report some back pain which she is followed by her PCP for, but denied any other concerns.        Otherwise, no significant interval history since the patient was last seen for follow-up.   She occasionally will use her vaginal dilator.  She denies any bleeding after use.  She denies any domino bloating hematuria or rectal bleeding.  She denies any vaginal bleeding.                        Allergies:  is allergic to chlorhexidine.  Meds: Current Outpatient Medications  Medication Sig Dispense Refill   acetaminophen (TYLENOL) 500 MG tablet as needed for pain.     albuterol (PROVENTIL HFA) 108 (90 Base) MCG/ACT inhaler as needed for shortness of breath.     alendronate (FOSAMAX) 70 MG tablet Take 70 mg by mouth once a week. Take with a full glass of water on an empty stomach.     amiodarone (PACERONE) 200 MG tablet TAKE 1 TABLET(200 MG) BY MOUTH DAILY 90 tablet 2   amLODipine (NORVASC) 10 MG tablet TAKE 1 TABLET EVERY DAY 90 tablet 0   atorvastatin  (LIPITOR) 40 MG tablet TAKE 1 TABLET(40 MG) BY MOUTH DAILY 90 tablet 2   calcium carbonate (OS-CAL - DOSED IN MG OF ELEMENTAL CALCIUM) 1250 MG tablet Take 1 tablet by mouth daily.     carvedilol (COREG) 12.5 MG tablet TAKE 1 TABLET TWICE DAILY WITH MEALS 180 tablet 3   Cholecalciferol (VITAMIN D) 1000 UNITS capsule Take 1,000 Units by mouth daily.     cyclobenzaprine (FLEXERIL) 10 MG tablet Take 10 mg by mouth daily.      furosemide (LASIX) 40 MG tablet daily at 6 (six) AM.     insulin glargine (LANTUS) 100 UNIT/ML injection Inject 30-35 Units into the skin See admin instructions. Inject 30 units into the skin in the morning and 35 units at bedtime.     irbesartan (AVAPRO) 300 MG tablet TAKE 1 TABLET EVERY DAY 90 tablet 0   metFORMIN (GLUCOPHAGE) 500 MG tablet Take 500 mg by mouth 2 (two) times daily with a meal.      methocarbamol (ROBAXIN) 500 MG tablet Take 500 mg by mouth every 8 (eight) hours as needed for muscle spasms.      potassium chloride (KLOR-CON) 10 MEQ tablet daily at 6 (six) AM.     senna (SENOKOT) 8.6 MG TABS tablet Take 1 tablet (8.6 mg total)  by mouth daily as needed for up to 15 doses for mild constipation. 15 tablet 0   warfarin (COUMADIN) 4 MG tablet TAKE 1 AND 1/2 TABLETS TO 2 TABLETS BY MOUTH DAILY AS DIRECTED BY COUMADIN CLINIC 180 tablet 1   dexamethasone (DECADRON) 4 MG tablet Take 2 tabs at the night before chemotherapy, every 3 weeks, by mouth x 6 cycles, please dispense 12 tabs (Patient not taking: Reported on 10/21/2021) 60 tablet 0   No current facility-administered medications for this encounter.    Physical Findings: The patient is in no acute distress. Patient is alert and oriented.  height is '5\' 6"'$  (1.676 m) and weight is 240 lb 8 oz (109.1 kg). Her oral temperature is 97.8 F (36.6 C). Her blood pressure is 145/68 (abnormal) and her pulse is 76. Her respiration is 18 and oxygen saturation is 95%. .  No significant changes. Lungs are clear to auscultation  bilaterally. Heart has regular rate and rhythm. No palpable cervical, supraclavicular, or axillary adenopathy. Abdomen soft, non-tender, normal bowel sounds.  On pelvic examination the external genitalia were unremarkable. A speculum exam was performed. There are no mucosal lesions noted in the vaginal vault.  Some radiation changes noted at the vaginal cuff.  On bimanual and rectovaginal examination there were no pelvic masses appreciated.  Vaginal cuff intact.  Rectal sphincter tone good.    Lab Findings: Lab Results  Component Value Date   WBC 5.9 08/10/2021   HGB 11.9 08/10/2021   HCT 35.9 08/10/2021   MCV 95 08/10/2021   PLT 165 08/10/2021    Radiographic Findings: No results found.  Impression:  Stage II endometrial cancer, high-grade  No evidence of recurrence on clinical exam today.  She does not appear to be exhibiting any long-term effects from her surgery and vaginal brachytherapy  Plan: She is now over 2 years out from her radiation therapy and can now transition to 68-monthinterval follow-up.  She will see Dr. TBerline Lopesin April of next year.  Routine follow-up in radiation oncology in 1 year.   24 minutes of total time was spent for this patient encounter, including preparation, face-to-face counseling with the patient and coordination of care, physical exam, and documentation of the encounter. ____________________________________  JBlair Promise PhD, MD  This document serves as a record of services personally performed by JGery Pray MD. It was created on his behalf by ERoney Mans a trained medical scribe. The creation of this record is based on the scribe's personal observations and the provider's statements to them. This document has been checked and approved by the attending provider.

## 2021-10-21 ENCOUNTER — Ambulatory Visit
Admission: RE | Admit: 2021-10-21 | Discharge: 2021-10-21 | Disposition: A | Discharge status: Home / Self Care | Payer: Medicare HMO | Source: Ambulatory Visit | Attending: Radiation Oncology | Admitting: Radiation Oncology

## 2021-10-21 VITALS — BP 145/68 | HR 76 | Temp 97.8°F | Resp 18 | Ht 66.0 in | Wt 240.5 lb

## 2021-10-21 DIAGNOSIS — Z923 Personal history of irradiation: Secondary | ICD-10-CM | POA: Diagnosis not present

## 2021-10-21 DIAGNOSIS — C541 Malignant neoplasm of endometrium: Secondary | ICD-10-CM | POA: Diagnosis not present

## 2021-10-21 DIAGNOSIS — Z8542 Personal history of malignant neoplasm of other parts of uterus: Secondary | ICD-10-CM | POA: Diagnosis not present

## 2021-10-21 DIAGNOSIS — Z79899 Other long term (current) drug therapy: Secondary | ICD-10-CM | POA: Insufficient documentation

## 2021-10-21 DIAGNOSIS — Z7984 Long term (current) use of oral hypoglycemic drugs: Secondary | ICD-10-CM | POA: Diagnosis not present

## 2021-10-21 NOTE — Progress Notes (Signed)
[name] is here today for follow up post radiation to the pelvic.  They completed their radiation on: 04/25/2019   Does the patient complain of any of the following:  Pain:no Abdominal bloating: no Diarrhea/Constipation: no Nausea/Vomiting: no Vaginal Discharge: no Blood in Urine or Stool: no Urinary Issues (dysuria/incomplete emptying/ incontinence/ increased frequency/urgency): no Does patient report using vaginal dilator 2-3 times a week and/or sexually active 2-3 weeks: yes occasionally. Post radiation skin changes: no   Additional comments if applicable: none noted.  BP (!) 145/68 (BP Location: Left Arm, Patient Position: Sitting)   Pulse 76   Temp 97.8 F (36.6 C) (Oral)   Resp 18   Ht '5\' 6"'$  (1.676 m)   Wt 240 lb 8 oz (109.1 kg)   SpO2 95%   BMI 38.82 kg/m

## 2021-10-28 ENCOUNTER — Other Ambulatory Visit: Payer: Self-pay

## 2021-10-28 ENCOUNTER — Ambulatory Visit: Payer: Medicare HMO | Attending: Interventional Cardiology

## 2021-10-28 DIAGNOSIS — I480 Paroxysmal atrial fibrillation: Secondary | ICD-10-CM | POA: Diagnosis not present

## 2021-10-28 DIAGNOSIS — Z954 Presence of other heart-valve replacement: Secondary | ICD-10-CM

## 2021-10-28 DIAGNOSIS — Z5181 Encounter for therapeutic drug level monitoring: Secondary | ICD-10-CM | POA: Diagnosis not present

## 2021-10-28 LAB — POCT INR: INR: 3.1 — AB (ref 2.0–3.0)

## 2021-10-28 MED ORDER — ENOXAPARIN SODIUM 100 MG/ML IJ SOSY: 100.0000 mg | PREFILLED_SYRINGE | Freq: Two times a day (BID) | INTRAMUSCULAR | 1 refills | Status: DC

## 2021-10-28 NOTE — Patient Instructions (Signed)
Continue taking Warfarin 1.5 tablets daily. Stay consistent with greens each week (2-3 times per week). Recheck INR in 2 weeks. Call coumadin clinic for any changes in medications or up coming procedures. [phone]. Dental procedure 11/10, will need Lovenox Bridge.

## 2021-11-11 ENCOUNTER — Ambulatory Visit: Payer: Medicare HMO | Attending: Cardiology

## 2021-11-11 DIAGNOSIS — I480 Paroxysmal atrial fibrillation: Secondary | ICD-10-CM | POA: Diagnosis not present

## 2021-11-11 DIAGNOSIS — Z5181 Encounter for therapeutic drug level monitoring: Secondary | ICD-10-CM

## 2021-11-11 DIAGNOSIS — Z954 Presence of other heart-valve replacement: Secondary | ICD-10-CM | POA: Diagnosis not present

## 2021-11-11 LAB — POCT INR: INR: 3.7 — AB (ref 2.0–3.0)

## 2021-11-11 NOTE — Patient Instructions (Signed)
Continue taking Warfarin 1.5 tablets daily. Stay consistent with greens each week (2-3 times per week). Recheck INR in 2 weeks. Call coumadin clinic for any changes in medications or up coming procedures. [phone]. Dental procedure 11/10, will need Lovenox Bridge.  11/4: Last dose of warfarin.  11/5: No warfarin or enoxaparin (Lovenox).  11/6: Inject enoxaparin '100mg'$  in the fatty abdominal tissue at least 2 inches from the belly button twice a day about 12 hours apart, 8am and 8pm rotate sites. No warfarin.  11/7: Inject enoxaparin in the fatty tissue every 12 hours, 8am and 8pm. No warfarin.  11/8: Inject enoxaparin in the fatty tissue every 12 hours, 8am and 8pm. No warfarin.  11/9: Inject enoxaparin in the fatty tissue in the morning at 8 am (No PM dose). No warfarin.  11/10: Procedure Day - No enoxaparin - Resume warfarin in the evening or as directed by doctor (take an extra half tablet with usual dose for 2 days then resume normal dose).  11/11: Resume enoxaparin inject in the fatty tissue every 12 hours and take warfarin  11/12: Inject enoxaparin in the fatty tissue every 12 hours and take warfarin  11/13: Inject enoxaparin in the fatty tissue every 12 hours and take warfarin  11/14: Inject enoxaparin in the fatty tissue every 12 hours and take warfarin  11/15: Inject enoxaparin in the fatty tissue every 12 hours and take warfarin  11/16: warfarin appt to check INR.

## 2021-11-12 ENCOUNTER — Telehealth: Payer: Self-pay | Admitting: Interventional Cardiology

## 2021-11-12 NOTE — Telephone Encounter (Signed)
Patient stated she will need to stop her Coumadin prior to surgery.  Patient stated no one called her back regarding stopping the Coumadin and taking the Lovenox bridge shots which she will need to take on Sunday.

## 2021-11-15 ENCOUNTER — Ambulatory Visit
Admission: RE | Admit: 2021-11-15 | Discharge: 2021-11-15 | Disposition: A | Discharge status: Home / Self Care | Payer: Medicare HMO | Source: Ambulatory Visit | Attending: Physician Assistant | Admitting: Physician Assistant

## 2021-11-15 DIAGNOSIS — Z1231 Encounter for screening mammogram for malignant neoplasm of breast: Secondary | ICD-10-CM

## 2021-11-15 NOTE — Telephone Encounter (Signed)
I spoke to the patient and advised her on Lovenox Bridging plan for the week.  She verbalized understanding.

## 2021-11-15 NOTE — Telephone Encounter (Signed)
Patient is calling back needing to know about holding coumadin for her dental procedure on 11/10. She states the dental office advised she should have started holding this as of yesterday, but advised she should call our office confirming if she can do so or not.

## 2021-11-15 NOTE — H&P (Signed)
  Patient: [name]  PID: [id number]  DOB: [birth year]  SEX: Female   Patient referred by DDS for extraction teeth # 14, 19, 30, 31  CC: No pain.   Past Medical History:  Damaged Heart Valves/, Heart Murmur, High Blood Pressure, Stroke, Diabetes, Cancer, Radiation/Chemotherapy, Morbid Obesity    Medications: Alendronate, Amiodarone, Amlodipine, Atorvastatin, Calcium Carbonate, Carvedilol, Vitamin D, Cyclobenzaprine, Decadron, Lantus, Avapro, Metformin, Robaxin, Senokot, Coumadin, Flecainide, prochlorperazine    Allergies:     None    Surgeries:   Portacath/Pic, Cancer Removal     Social History       Smoking: n           Alcohol:n Drug usen:                             Exam: BMI 54  Gross decay # 14, 19, 30, 31.No purulence, edema, fluctuance, trismus. Oral cancer screening negative. Pharynx clear. No lymphadenopathy.  PAXR: Gross decay # 14, 19, 30, 31.  Assessment: ASA 3. Non-restorable  14, 19, 30, 31.              Plan: 1. MD   2. Cardiac clearance   3. Extraction Teeth #   14, 19, 30, 31. Hospital Day surgery.                 Rx: n               Risks and complications explained. Questions answered.   Gae Bon, DMD

## 2021-11-18 ENCOUNTER — Encounter (HOSPITAL_COMMUNITY): Payer: Self-pay | Admitting: Oral Surgery

## 2021-11-18 NOTE — Anesthesia Preprocedure Evaluation (Addendum)
Anesthesia Evaluation  Patient identified by MRN, date of birth, ID band Patient awake    Reviewed: Allergy & Precautions, NPO status , Patient's Chart, lab work & pertinent test results  History of Anesthesia Complications Negative for: history of anesthetic complications  Airway Mallampati: II  TM Distance: >3 FB Neck ROM: Full    Dental  (+) Dental Advisory Given   Pulmonary asthma , sleep apnea    Pulmonary exam normal        Cardiovascular hypertension, Pt. on medications pulmonary hypertension+CHF  Normal cardiovascular exam+ dysrhythmias (s/p MAZE) Atrial Fibrillation + Valvular Problems/Murmurs (MR/TR, s/p MV replacement 31 mm Sorin Carbomedics mechanical prothesis, TV Repair with 28 mm Edwards annuloplasty ring; MAZE 02/15/12)   Echo 08/24/21: EF 60-65%, no RWMA, normal RVSF, mild pulm HTN, s/p mechanical MVR with trivial MR, AV sclerosis w/o stenosis   Neuro/Psych CVA (2014, residual LUE weakness)    GI/Hepatic negative GI ROS, Neg liver ROS,,,  Endo/Other  diabetes, Type 2, Insulin Dependent, Oral Hypoglycemic Agents    Renal/GU CRFRenal disease  negative genitourinary   Musculoskeletal negative musculoskeletal ROS (+)    Abdominal   Peds  Hematology  (+) Blood dyscrasia, anemia   Anesthesia Other Findings   Reproductive/Obstetrics                             Anesthesia Physical Anesthesia Plan  ASA: 3  Anesthesia Plan: General   Post-op Pain Management: Tylenol PO (pre-op)*   Induction: Intravenous  PONV Risk Score and Plan: 3 and Ondansetron, Dexamethasone, Treatment may vary due to age or medical condition and Midazolam  Airway Management Planned: Nasal ETT  Additional Equipment: None  Intra-op Plan:   Post-operative Plan: Extubation in OR  Informed Consent: I have reviewed the patients History and Physical, chart, labs and discussed the procedure including the  risks, benefits and alternatives for the proposed anesthesia with the patient or authorized representative who has indicated his/her understanding and acceptance.     Dental advisory given  Plan Discussed with:   Anesthesia Plan Comments: (PAT note written 11/18/2021 by Myra Gianotti, PA-C.  )       Anesthesia Quick Evaluation

## 2021-11-18 NOTE — Progress Notes (Addendum)
Ms Whidden denies chest pain or shortness of breath.  Patient denies having any s/s of Covid in her household, also denies any known exposure to Covid.  Mrs. Dredge Cardiologist is Dr. Beau Fanny, PCP is Dr. Kelton Pillar patient told me that he sees an  endocrinologist also, I called [place] office and I was told that patient does not see an endocrinologist.I also was informed that Ms Aller PCP is Dr.Redman.  Mrs Drenning reports that she has type I diabetes, patient states that she does not check CBG, she does not know how to use it. Mrs. Zulauf reported that she stop taking Lantus Insulin on 11/11/21- she thought she needed to stop it prior to surgery.  Patient said that she is taking Medformin.  I have a call out to patient's PCP.  I spoke with diabetic coordinator and explained what Mrs Vecchiarelli has done, (holding Lantus).  Lauren said to have patient take- 1/2 dose of scheduled Lantus Insulin tonight and 1/2  of scheduled Lantus Insulin in am. I called Mrs Hakimi and instructed her to take 17 units of Lantus Insulin tonight and 15 units of Lantus Insulin in am. Patient repeated my instructions without any problem.

## 2021-11-18 NOTE — Progress Notes (Signed)
Anesthesia Chart Review: [name]   Case: [id number] Date/Time: 11/19/21 0836   Procedure: DENTAL RESTORATION/EXTRACTIONS   Anesthesia type: General   Pre-op diagnosis: DENTAL CARIES   Location: [place] OR ROOM 12 / [place] OR   Surgeons: Diona Browner, DMD       DISCUSSION: Patient is a 66 year old female scheduled for the above procedure.  History includes never smoker, asthma, HTN, CHF, murmur (MR/TR, s/p MV replacement 31 mm Sorin Carbomedics mechanical prothesis, TV Repair with 28 mm Edwards annuloplasty ring; MAZE 02/15/12), afib/flutter (s/p DCCV 03/25/11 & 07/27/12; s/p MAZE 02/15/12), LBBB, DM2 (insulin dependent), OSA (w/o device), , s/p mitral valve replacement, tricuspid valve repair, LBBB, CVA (04/2012, residual LUE weakness), endometrial  cancer (s/p robotic assisted laparoscopic hysterectomy/BSO 01/02/19; s/p chemoradiation), anemia, exertional dyspnea. Normal coronaries and LV function with moderate pulmonary HTN by 12/2011 cath.   Last office visit with cardiologist Dr. Irish Lack was on 08/10/21 for follow-up and preoperative clearance. Echo was updated but otherwise no further cardiac testing recommended prior to surgery. Given low EF, he did recommend "would try to avoid excessive IV fluids to prevent heart failure."  However, updated echo on 08/24/21 showed normalization of LVEF, normal LV/RV function, normally functioning prosthetic mitral valve with trivial MR. [facility] provided Lovenox bridging instructions while warfarin on hold for dental procedure. (See 11/11/21 note by Frederik Schmidt, RN).  Of note, when she held warfarin, she apparently thought she was also suppose to hold Lantus, so her last dose was on 11/11/21. She continued her metformin. She does not check home CBGs. Per guidelines and PAT RN discussion with [place] DM Coordinator, she will take half dose of Lantus the evening before and morning of her surgery, then per discharge instructions.   She is a same day work-up,  so she will get labs on arrival.  Anesthesia team to evaluate on the day of surgery.   VS: Ht '5\' 6"'$  (1.676 m)   Wt 108.9 kg   BMI 38.74 kg/m  BP Readings from Last 3 Encounters:  10/21/21 (!) 145/68  08/10/21 132/78  07/12/21 (!) 148/77   Pulse Readings from Last 3 Encounters:  10/21/21 76  08/10/21 74  07/12/21 87     PROVIDERS: Lennie Odor, PA is PCP  Larae Grooms, MD is cardiologist Gery Pray, MD is RAD-ONC Heath Lark, MD is HEM-ONC Jeral Pinch, MD is GYN-ONC  LABS: Labs on arrival as indicated. Last results in [facility] include: Lab Results  Component Value Date   WBC 5.9 08/10/2021   HGB 11.9 08/10/2021   HCT 35.9 08/10/2021   PLT 165 08/10/2021   GLUCOSE 134 (H) 08/10/2021   ALT 17 08/10/2021   AST 22 08/10/2021   NA 140 08/10/2021   K 4.1 08/10/2021   CL 101 08/10/2021   CREATININE 0.95 08/10/2021   BUN 19 08/10/2021   CO2 27 08/10/2021   INR 3.7 (A) 11/11/2021    EKG: 08/10/21: Afib at 74 bpm. LAD. LBBB.    CV: Echo 08/24/21: IMPRESSIONS   1. Left ventricular ejection fraction, by estimation, is 60 to 65%. The  left ventricle has normal function. The left ventricle has no regional  wall motion abnormalities. Left ventricular diastolic parameters are  indeterminate.   2. Right ventricular systolic function is normal. The right ventricular  size is normal. There is mildly elevated pulmonary artery systolic  pressure.   3. Left atrial size was moderately dilated.   4. Post 31 mm Sorin mechanical valve normal function bi leaflet .  The  mitral valve has been repaired/replaced. Trivial mitral valve  regurgitation. No evidence of mitral stenosis. Procedure Date: 02/15/2012.   5. Post annuloplasty ring. The tricuspid valve is has been  repaired/replaced.   6. The aortic valve is tricuspid. There is moderate calcification of the  aortic valve. There is moderate thickening of the aortic valve. Aortic  valve regurgitation is trivial. Aortic  valve sclerosis/calcification is  present, without any evidence of  aortic stenosis.   7. The inferior vena cava is normal in size with greater than 50%  respiratory variability, suggesting right atrial pressure of 3 mmHg.  - EF improved from 35-40%, anteroseptal akinesis 10/10/16 echo   US Carotid 02/13/12: Summary:  Bilateral: minimal plaque noted. No ICA stenosis. Vertebral  artery flow is antegrade. ICA/CCA ratio: R-1.0 L-0.87    RHC/LHC 12/29/21: IMPRESSIONS: Normal left main coronary artery. Normal left anterior descending artery and its branches. Normal left circumflex artery and its branches. Normal right coronary artery. Normal left ventricular systolic function.  LVEDP 26 mmHg.  Ejection fraction 60%. 6.  Moderate pulmonary hypertension.  CI 1.7.    Past Medical History:  Diagnosis Date   Abnormal chest CT    Anemia    Aortic atherosclerosis (HCC)    Asthma    Atrial enlargement, left    severe   Atrial fibrillation (HCC) 01/05/2011   Chronic persistent, failed DCCV    Atrial flutter (HCC)    Bronchospasm 1998   Cardiomegaly    Carotid bruit    Chronic diastolic congestive heart failure (HCC)    Diabetes mellitus    Type II   Ejection fraction < 50%    35-40%,    Heart murmur    History of blood transfusion    History of radiation therapy 03/18/19-04/15/19   endometrial - vaginal brachytherapy -  Dr. Sondra Come    Hypercholesterolemia    Hypertension    Junctional rhythm 09/14/2018   Noted on EKG   Left bundle branch block (LBBB) 09/14/2018   Noted on EKG   LVH (left ventricular hypertrophy) 10/10/2016   Moderate, Noted on ECHO   Mitral regurgitation    Obesity    Obesity (BMI 30-39.9) 01/16/2012   Persistent atrial fibrillation ([place])    Polyp of rectum    Rheumatic fever 01/16/2012   Reported during childhood   S/P Maze operation for atrial fibrillation 02/15/2012   Complete biatrial lesion set using bipolar radiofrequency and cryothermy ablation  with clipping of LA appendage   S/P mitral valve replacement with metallic valve 37/10/6267   44m Sorin Carbomedics Optiform mechanical prosthesis   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   269mEdwards mc3 ring annuloplasty   Shortness of breath    with exertion   Sleep apnea    DOES NOT HAVE CPAP   [place]   Post op , left arm weakness   Tricuspid regurgitation 01/16/2012   [facility]    Past Surgical History:  Procedure Laterality Date   CARDIAC CATHETERIZATION  >5 years   CARDIOVASCULAR STRESS TEST  09/2010   CARDIOVERSION  03/25/2011   Procedure: CARDIOVERSION;  Surgeon: JaJettie BoozeMD;  [facility] Service: Cardiovascular;  Laterality: N/A;   CARDIOVERSION N/A 07/27/2012   Procedure: CARDIOVERSION;  Surgeon: JaJettie BoozeMD;  [facility];  Service: Cardiovascular;  Laterality: N/A;   CESAREAN SECTION     COLONOSCOPY WITH PROPOFOL N/A 04/27/2016   Procedure: COLONOSCOPY WITH PROPOFOL;  Surgeon: ViEvette Doffing  Michail Sermon, MD;  Location: [place];  Service: Endoscopy;  Laterality: N/A;   INTRAOPERATIVE TRANSESOPHAGEAL ECHOCARDIOGRAM  02/15/2012   Procedure: INTRAOPERATIVE TRANSESOPHAGEAL ECHOCARDIOGRAM;  Surgeon: Rexene Alberts, MD;  Location: [place];  Service: Open Heart Surgery;  Laterality: N/A;   MAZE  02/15/2012   Procedure: MAZE;  Surgeon: Rexene Alberts, MD;  Location: [place];  Service: Open Heart Surgery;  Laterality: N/A;   MITRAL VALVE REPLACEMENT  02/15/2012   Procedure: MITRAL VALVE (MV) REPLACEMENT;  Surgeon: Rexene Alberts, MD;  Location: [place];  Service: Open Heart Surgery;  Laterality: N/A;   ROBOTIC ASSISTED LAPAROSCOPIC HYSTERECTOMY AND SALPINGECTOMY Bilateral 01/02/2019   Procedure: XI ROBOTIC ASSISTED LAPAROSCOPIC TOTAL HYSTERECTOMY WITH BILATERAL SALPINGOOPHORECTOMY, SENTINEL LYMPH NODE BIOPSY;  Surgeon: [name], MD;  Location: [facility];  Service: Gynecology;  Laterality: Bilateral;   TEE WITHOUT  CARDIOVERSION  12/21/2011   Procedure: TRANSESOPHAGEAL ECHOCARDIOGRAM (TEE);  Surgeon: Jettie Booze, MD;  Location[facility]COPY;  Service: Cardiovascular;  Laterality: N/A;   TRICUSPID VALVE REPLACEMENT  02/15/2012   Procedure: TRICUSPID VALVE REPAIR;  Surgeon: Rexene Alberts, MD;  Location: [place];  Service: Open Heart Surgery;  Laterality: N/A;   TUBAL LIGATION     at time of her c-section    MEDICATIONS: No current facility-administered medications for this encounter.    acetaminophen (TYLENOL) 500 MG tablet   albuterol (PROVENTIL HFA) 108 (90 Base) MCG/ACT inhaler   alendronate (FOSAMAX) 70 MG tablet   amiodarone (PACERONE) 200 MG tablet   amLODipine (NORVASC) 10 MG tablet   atorvastatin (LIPITOR) 40 MG tablet   calcium carbonate (OS-CAL - DOSED IN MG OF ELEMENTAL CALCIUM) 1250 MG tablet   carvedilol (COREG) 12.5 MG tablet   Cholecalciferol (VITAMIN D) 1000 UNITS capsule   cyclobenzaprine (FLEXERIL) 10 MG tablet   enoxaparin (LOVENOX) 100 MG/ML injection   furosemide (LASIX) 40 MG tablet   insulin glargine (LANTUS) 100 UNIT/ML injection   irbesartan (AVAPRO) 300 MG tablet   metFORMIN (GLUCOPHAGE) 500 MG tablet   methocarbamol (ROBAXIN) 500 MG tablet   senna (SENOKOT) 8.6 MG TABS tablet   warfarin (COUMADIN) 4 MG tablet    Myra Gianotti, PA-C Surgical Short Stay/Anesthesiology [facility] Phone [phone] [facility] Phone [phone] 11/18/2021 1:23 PM

## 2021-11-19 ENCOUNTER — Ambulatory Visit (HOSPITAL_COMMUNITY)
Admission: RE | Admit: 2021-11-19 | Discharge: 2021-11-19 | Disposition: A | Discharge status: Home / Self Care | Payer: Medicare HMO | Attending: Oral Surgery | Admitting: Oral Surgery

## 2021-11-19 ENCOUNTER — Encounter (HOSPITAL_COMMUNITY): Payer: Self-pay | Admitting: Oral Surgery

## 2021-11-19 ENCOUNTER — Ambulatory Visit (HOSPITAL_BASED_OUTPATIENT_CLINIC_OR_DEPARTMENT_OTHER): Payer: Medicare HMO | Admitting: Vascular Surgery

## 2021-11-19 ENCOUNTER — Ambulatory Visit (HOSPITAL_COMMUNITY): Payer: Medicare HMO | Admitting: Vascular Surgery

## 2021-11-19 ENCOUNTER — Encounter (HOSPITAL_COMMUNITY)
Admission: RE | Disposition: A | Discharge status: Home / Self Care | Payer: Self-pay | Source: Home / Self Care | Attending: Oral Surgery

## 2021-11-19 DIAGNOSIS — I110 Hypertensive heart disease with heart failure: Secondary | ICD-10-CM | POA: Insufficient documentation

## 2021-11-19 DIAGNOSIS — K0889 Other specified disorders of teeth and supporting structures: Secondary | ICD-10-CM | POA: Diagnosis not present

## 2021-11-19 DIAGNOSIS — E1122 Type 2 diabetes mellitus with diabetic chronic kidney disease: Secondary | ICD-10-CM

## 2021-11-19 DIAGNOSIS — Z794 Long term (current) use of insulin: Secondary | ICD-10-CM | POA: Insufficient documentation

## 2021-11-19 DIAGNOSIS — Z7984 Long term (current) use of oral hypoglycemic drugs: Secondary | ICD-10-CM

## 2021-11-19 DIAGNOSIS — I509 Heart failure, unspecified: Secondary | ICD-10-CM | POA: Insufficient documentation

## 2021-11-19 DIAGNOSIS — Z8673 Personal history of transient ischemic attack (TIA), and cerebral infarction without residual deficits: Secondary | ICD-10-CM | POA: Insufficient documentation

## 2021-11-19 DIAGNOSIS — I130 Hypertensive heart and chronic kidney disease with heart failure and stage 1 through stage 4 chronic kidney disease, or unspecified chronic kidney disease: Secondary | ICD-10-CM

## 2021-11-19 DIAGNOSIS — N189 Chronic kidney disease, unspecified: Secondary | ICD-10-CM

## 2021-11-19 DIAGNOSIS — K029 Dental caries, unspecified: Secondary | ICD-10-CM | POA: Insufficient documentation

## 2021-11-19 DIAGNOSIS — J45909 Unspecified asthma, uncomplicated: Secondary | ICD-10-CM | POA: Diagnosis not present

## 2021-11-19 DIAGNOSIS — K0850 Unsatisfactory restoration of tooth, unspecified: Secondary | ICD-10-CM

## 2021-11-19 DIAGNOSIS — E119 Type 2 diabetes mellitus without complications: Secondary | ICD-10-CM | POA: Insufficient documentation

## 2021-11-19 DIAGNOSIS — G4730 Sleep apnea, unspecified: Secondary | ICD-10-CM | POA: Diagnosis not present

## 2021-11-19 HISTORY — PX: TOOTH EXTRACTION: SHX859

## 2021-11-19 HISTORY — DX: Anemia, unspecified: D64.9

## 2021-11-19 LAB — CBC
HCT: 33.8 % — ABNORMAL LOW (ref 36.0–46.0)
Hemoglobin: 11.1 g/dL — ABNORMAL LOW (ref 12.0–15.0)
MCH: 32.8 pg (ref 26.0–34.0)
MCHC: 32.8 g/dL (ref 30.0–36.0)
MCV: 100 fL (ref 80.0–100.0)
Platelets: 144 10*3/uL — ABNORMAL LOW (ref 150–400)
RBC: 3.38 MIL/uL — ABNORMAL LOW (ref 3.87–5.11)
RDW: 13.1 % (ref 11.5–15.5)
WBC: 6.1 10*3/uL (ref 4.0–10.5)
nRBC: 0 % (ref 0.0–0.2)

## 2021-11-19 LAB — BASIC METABOLIC PANEL
Anion gap: 9 (ref 5–15)
BUN: 18 mg/dL (ref 8–23)
CO2: 24 mmol/L (ref 22–32)
Calcium: 8.8 mg/dL — ABNORMAL LOW (ref 8.9–10.3)
Chloride: 107 mmol/L (ref 98–111)
Creatinine, Ser: 0.97 mg/dL (ref 0.44–1.00)
GFR, Estimated: >60 mL/min (ref >60)
Glucose, Bld: 160 mg/dL — ABNORMAL HIGH (ref 70–99)
Potassium: 3.7 mmol/L (ref 3.5–5.1)
Sodium: 140 mmol/L (ref 135–145)

## 2021-11-19 LAB — GLUCOSE, CAPILLARY
Glucose-Capillary: 174 mg/dL — ABNORMAL HIGH (ref 70–99)
Glucose-Capillary: 142 mg/dL — ABNORMAL HIGH (ref 70–99)
Glucose-Capillary: 173 mg/dL — ABNORMAL HIGH (ref 70–99)

## 2021-11-19 LAB — PROTIME-INR
INR: 1.2 (ref 0.8–1.2)
Prothrombin Time: 14.9 seconds (ref 11.4–15.2)

## 2021-11-19 SURGERY — DENTAL RESTORATION/EXTRACTIONS
Anesthesia: General | Site: Mouth

## 2021-11-19 MED ORDER — 0.9 % SODIUM CHLORIDE (POUR BTL) OPTIME
TOPICAL | Status: DC | PRN
  Administered 2021-11-19: 1000 mL

## 2021-11-19 MED ORDER — AMISULPRIDE (ANTIEMETIC) 5 MG/2ML IV SOLN: 10.0000 mg | Freq: Once | INTRAVENOUS | Status: DC | PRN

## 2021-11-19 MED ORDER — LIDOCAINE-EPINEPHRINE 2 %-1:100000 IJ SOLN
INTRAMUSCULAR | Status: AC
  Filled 2021-11-19: qty 1

## 2021-11-19 MED ORDER — PHENYLEPHRINE 80 MCG/ML (10ML) SYRINGE FOR IV PUSH (FOR BLOOD PRESSURE SUPPORT)
PREFILLED_SYRINGE | INTRAVENOUS | Status: DC | PRN
  Administered 2021-11-19 (×6): 80 ug via INTRAVENOUS

## 2021-11-19 MED ORDER — EPHEDRINE 5 MG/ML INJ
INTRAVENOUS | Status: AC
  Filled 2021-11-19: qty 5

## 2021-11-19 MED ORDER — DEXAMETHASONE SODIUM PHOSPHATE 10 MG/ML IJ SOLN
INTRAMUSCULAR | Status: AC
  Filled 2021-11-19: qty 1

## 2021-11-19 MED ORDER — INSULIN ASPART 100 UNIT/ML IJ SOLN
INTRAMUSCULAR | Status: AC
  Administered 2021-11-19: 2 [IU] via SUBCUTANEOUS
  Filled 2021-11-19: qty 1

## 2021-11-19 MED ORDER — PHENYLEPHRINE 80 MCG/ML (10ML) SYRINGE FOR IV PUSH (FOR BLOOD PRESSURE SUPPORT)
PREFILLED_SYRINGE | INTRAVENOUS | Status: AC
  Filled 2021-11-19: qty 10

## 2021-11-19 MED ORDER — AMOXICILLIN 500 MG PO CAPS: 500.0000 mg | ORAL_CAPSULE | Freq: Two times a day (BID) | ORAL | 0 refills | Status: DC

## 2021-11-19 MED ORDER — FENTANYL CITRATE (PF) 250 MCG/5ML IJ SOLN
INTRAMUSCULAR | Status: AC
  Filled 2021-11-19: qty 5

## 2021-11-19 MED ORDER — LIDOCAINE 2% (20 MG/ML) 5 ML SYRINGE
INTRAMUSCULAR | Status: DC | PRN
  Administered 2021-11-19: 100 mg via INTRAVENOUS

## 2021-11-19 MED ORDER — EPHEDRINE SULFATE-NACL 50-0.9 MG/10ML-% IV SOSY
PREFILLED_SYRINGE | INTRAVENOUS | Status: DC | PRN
  Administered 2021-11-19: 10 mg via INTRAVENOUS
  Administered 2021-11-19: 5 mg via INTRAVENOUS
  Administered 2021-11-19: 10 mg via INTRAVENOUS
  Administered 2021-11-19: 5 mg via INTRAVENOUS
  Administered 2021-11-19 (×2): 10 mg via INTRAVENOUS

## 2021-11-19 MED ORDER — ALBUTEROL SULFATE HFA 108 (90 BASE) MCG/ACT IN AERS
INHALATION_SPRAY | RESPIRATORY_TRACT | Status: AC
  Filled 2021-11-19: qty 6.7

## 2021-11-19 MED ORDER — ALBUTEROL SULFATE (2.5 MG/3ML) 0.083% IN NEBU
2.5000 mg | INHALATION_SOLUTION | Freq: Four times a day (QID) | RESPIRATORY_TRACT | Status: DC | PRN
  Administered 2021-11-19: 2.5 mg via RESPIRATORY_TRACT

## 2021-11-19 MED ORDER — ROCURONIUM BROMIDE 10 MG/ML (PF) SYRINGE
PREFILLED_SYRINGE | INTRAVENOUS | Status: AC
  Filled 2021-11-19: qty 10

## 2021-11-19 MED ORDER — ONDANSETRON HCL 4 MG/2ML IJ SOLN: 4.0000 mg | Freq: Once | INTRAMUSCULAR | Status: DC | PRN

## 2021-11-19 MED ORDER — SODIUM CHLORIDE 0.9 % IR SOLN
Status: DC | PRN
  Administered 2021-11-19: 1000 mL

## 2021-11-19 MED ORDER — FENTANYL CITRATE (PF) 100 MCG/2ML IJ SOLN: 25.0000 ug | INTRAMUSCULAR | Status: DC | PRN

## 2021-11-19 MED ORDER — ORAL CARE MOUTH RINSE
15.0000 mL | Freq: Once | OROMUCOSAL | Status: AC
  Administered 2021-11-19: 15 mL via OROMUCOSAL

## 2021-11-19 MED ORDER — ROCURONIUM BROMIDE 10 MG/ML (PF) SYRINGE
PREFILLED_SYRINGE | INTRAVENOUS | Status: DC | PRN
  Administered 2021-11-19: 70 mg via INTRAVENOUS

## 2021-11-19 MED ORDER — MIDAZOLAM HCL 2 MG/2ML IJ SOLN
INTRAMUSCULAR | Status: DC | PRN
  Administered 2021-11-19: 2 mg via INTRAVENOUS

## 2021-11-19 MED ORDER — PROPOFOL 10 MG/ML IV BOLUS
INTRAVENOUS | Status: DC | PRN
  Administered 2021-11-19: 140 mg via INTRAVENOUS

## 2021-11-19 MED ORDER — DEXAMETHASONE SODIUM PHOSPHATE 10 MG/ML IJ SOLN
INTRAMUSCULAR | Status: DC | PRN
  Administered 2021-11-19: 10 mg via INTRAVENOUS

## 2021-11-19 MED ORDER — ONDANSETRON HCL 4 MG/2ML IJ SOLN
INTRAMUSCULAR | Status: AC
  Filled 2021-11-19: qty 2

## 2021-11-19 MED ORDER — FENTANYL CITRATE (PF) 250 MCG/5ML IJ SOLN
INTRAMUSCULAR | Status: DC | PRN
  Administered 2021-11-19: 100 ug via INTRAVENOUS

## 2021-11-19 MED ORDER — ACETAMINOPHEN 500 MG PO TABS
1000.0000 mg | ORAL_TABLET | Freq: Once | ORAL | Status: AC
  Administered 2021-11-19: 1000 mg via ORAL
  Filled 2021-11-19: qty 2

## 2021-11-19 MED ORDER — SUGAMMADEX SODIUM 200 MG/2ML IV SOLN
INTRAVENOUS | Status: DC | PRN
  Administered 2021-11-19 (×2): 50 mg via INTRAVENOUS
  Administered 2021-11-19: 200 mg via INTRAVENOUS

## 2021-11-19 MED ORDER — ONDANSETRON HCL 4 MG/2ML IJ SOLN
INTRAMUSCULAR | Status: DC | PRN
  Administered 2021-11-19: 4 mg via INTRAVENOUS

## 2021-11-19 MED ORDER — OXYMETAZOLINE HCL 0.05 % NA SOLN
NASAL | Status: DC | PRN
  Administered 2021-11-19 (×2): 2 via NASAL

## 2021-11-19 MED ORDER — PROPOFOL 10 MG/ML IV BOLUS
INTRAVENOUS | Status: AC
  Filled 2021-11-19: qty 20

## 2021-11-19 MED ORDER — ALBUTEROL SULFATE (2.5 MG/3ML) 0.083% IN NEBU
INHALATION_SOLUTION | RESPIRATORY_TRACT | Status: AC
  Filled 2021-11-19: qty 3

## 2021-11-19 MED ORDER — INSULIN ASPART 100 UNIT/ML IJ SOLN: 0.0000 [IU] | INTRAMUSCULAR | Status: DC | PRN

## 2021-11-19 MED ORDER — OXYCODONE HCL 5 MG PO TABS: 5.0000 mg | ORAL_TABLET | Freq: Once | ORAL | Status: DC | PRN

## 2021-11-19 MED ORDER — LIDOCAINE-EPINEPHRINE 2 %-1:100000 IJ SOLN
INTRAMUSCULAR | Status: DC | PRN
  Administered 2021-11-19: 8 mL

## 2021-11-19 MED ORDER — OXYCODONE HCL 5 MG/5ML PO SOLN: 5.0000 mg | Freq: Once | ORAL | Status: DC | PRN

## 2021-11-19 MED ORDER — LACTATED RINGERS IV SOLN: INTRAVENOUS | Status: DC

## 2021-11-19 MED ORDER — CEFAZOLIN SODIUM-DEXTROSE 2-4 GM/100ML-% IV SOLN
2.0000 g | INTRAVENOUS | Status: AC
  Administered 2021-11-19: 2 g via INTRAVENOUS
  Filled 2021-11-19: qty 100

## 2021-11-19 MED ORDER — CHLORHEXIDINE GLUCONATE 0.12 % MT SOLN: 15.0000 mL | Freq: Once | OROMUCOSAL | Status: DC

## 2021-11-19 MED ORDER — LIDOCAINE 2% (20 MG/ML) 5 ML SYRINGE
INTRAMUSCULAR | Status: AC
  Filled 2021-11-19: qty 5

## 2021-11-19 MED ORDER — HYDROCODONE-ACETAMINOPHEN 5-325 MG PO TABS: 1.0000 | ORAL_TABLET | ORAL | 0 refills | Status: DC | PRN

## 2021-11-19 MED ORDER — ENOXAPARIN SODIUM 100 MG/ML IJ SOSY: 100.0000 mg | PREFILLED_SYRINGE | Freq: Two times a day (BID) | INTRAMUSCULAR | 1 refills | Status: DC

## 2021-11-19 MED ORDER — ALBUTEROL SULFATE HFA 108 (90 BASE) MCG/ACT IN AERS
INHALATION_SPRAY | RESPIRATORY_TRACT | Status: DC | PRN
  Administered 2021-11-19 (×5): 2 via RESPIRATORY_TRACT

## 2021-11-19 MED ORDER — OXYMETAZOLINE HCL 0.05 % NA SOLN
NASAL | Status: DC | PRN
  Administered 2021-11-19: 1 via TOPICAL

## 2021-11-19 MED ORDER — MIDAZOLAM HCL 2 MG/2ML IJ SOLN
INTRAMUSCULAR | Status: AC
  Filled 2021-11-19: qty 2

## 2021-11-19 SURGICAL SUPPLY — 33 items
BAG COUNTER SPONGE SURGICOUNT (BAG) IMPLANT
BAG SPNG CNTER NS LX DISP (BAG) ×1
BLADE SURG 15 STRL LF DISP TIS (BLADE) ×2 IMPLANT
BLADE SURG 15 STRL SS (BLADE) ×1
BUR CROSS CUT FISSURE 1.6 (BURR) ×2 IMPLANT
BUR EGG ELITE 4.0 (BURR) ×2 IMPLANT
CANISTER SUCT 3000ML PPV (MISCELLANEOUS) ×2 IMPLANT
COVER SURGICAL LIGHT HANDLE (MISCELLANEOUS) ×2 IMPLANT
DRAPE U-SHAPE 76X120 STRL (DRAPES) ×2 IMPLANT
GAUZE PACKING FOLDED 2  STR (GAUZE/BANDAGES/DRESSINGS) ×1
GAUZE PACKING FOLDED 2 STR (GAUZE/BANDAGES/DRESSINGS) ×2 IMPLANT
GLOVE BIO SURGEON STRL SZ8 (GLOVE) ×2 IMPLANT
GLOVE BIOGEL PI IND STRL 7.0 (GLOVE) IMPLANT
GOWN STRL REUS W/ TWL LRG LVL3 (GOWN DISPOSABLE) ×2 IMPLANT
GOWN STRL REUS W/ TWL XL LVL3 (GOWN DISPOSABLE) ×2 IMPLANT
GOWN STRL REUS W/TWL LRG LVL3 (GOWN DISPOSABLE) ×1
GOWN STRL REUS W/TWL XL LVL3 (GOWN DISPOSABLE) ×1
IV NS 1000ML (IV SOLUTION) ×1
IV NS 1000ML BAXH (IV SOLUTION) ×2 IMPLANT
KIT BASIN OR (CUSTOM PROCEDURE TRAY) ×2 IMPLANT
KIT TURNOVER KIT B (KITS) ×2 IMPLANT
NDL HYPO 25GX1X1/2 BEV (NEEDLE) ×4 IMPLANT
NEEDLE HYPO 25GX1X1/2 BEV (NEEDLE) ×2 IMPLANT
NS IRRIG 1000ML POUR BTL (IV SOLUTION) ×2 IMPLANT
PAD ARMBOARD 7.5X6 YLW CONV (MISCELLANEOUS) ×2 IMPLANT
SLEEVE IRRIGATION ELITE 7 (MISCELLANEOUS) ×2 IMPLANT
SPONGE SURGIFOAM ABS GEL 12-7 (HEMOSTASIS) IMPLANT
SUT CHROMIC 3 0 PS 2 (SUTURE) ×2 IMPLANT
SYR BULB IRRIG 60ML STRL (SYRINGE) ×2 IMPLANT
SYR CONTROL 10ML LL (SYRINGE) ×2 IMPLANT
TRAY ENT MC OR (CUSTOM PROCEDURE TRAY) ×2 IMPLANT
TUBING IRRIGATION (MISCELLANEOUS) ×2 IMPLANT
YANKAUER SUCT BULB TIP NO VENT (SUCTIONS) ×2 IMPLANT

## 2021-11-19 NOTE — H&P (Signed)
H&P documentation  -History and Physical Reviewed  -Patient has been re-examined  -No change in the plan of care  [name]  

## 2021-11-19 NOTE — Op Note (Signed)
11/19/2021  9:43 AM  PATIENT:  [name]  66 y.o. female  PRE-OPERATIVE DIAGNOSIS:  NON-RESTORABLE TEETH # 14, 19, 30, 31 SECONDARY TO DENTAL CARIES  POST-OPERATIVE DIAGNOSIS:  SAME+ NON-RESTORABLE TOOTH #15.  PROCEDURE:  Procedure(s): DENTAL EXTRACTION TEETH NUMBER 14, 15, 19, 30, 31  SURGEON:  Surgeon(s): Hoyt Koch, Event organiser, DMD  ANESTHESIA:   local and general  EBL:  minimal  DRAINS: none   SPECIMEN:  No Specimen  COUNTS:  YES  PLAN OF CARE: Discharge to home after PACU  PATIENT DISPOSITION:  PACU - hemodynamically stable.   PROCEDURE DETAILS: Dictation # [id number]  Gae Bon, DMD 11/19/2021 9:43 AM

## 2021-11-19 NOTE — Anesthesia Procedure Notes (Signed)
Procedure Name: Intubation Date/Time: 11/19/2021 9:07 AM  Performed by: Michele Rockers, CRNAPre-anesthesia Checklist: Patient identified, Emergency Drugs available, Suction available and Patient being monitored Patient Re-evaluated:Patient Re-evaluated prior to induction Oxygen Delivery Method: Circle system utilized Preoxygenation: Pre-oxygenation with 100% oxygen Induction Type: IV induction Ventilation: Mask ventilation without difficulty Laryngoscope Size: Mac, 3 and Glidescope Grade View: Grade I Nasal Tubes: Nasal prep performed, Nasal Rae and Right Tube size: 7.5 mm Number of attempts: 1 Airway Equipment and Method: Video-laryngoscopy Placement Confirmation: ETT inserted through vocal cords under direct vision, positive ETCO2 and breath sounds checked- equal and bilateral Tube secured with: Tape Dental Injury: Teeth and Oropharynx as per pre-operative assessment

## 2021-11-19 NOTE — Transfer of Care (Signed)
Immediate Anesthesia Transfer of Care Note  Patient: [name]  Procedure(s) Performed: DENTAL EXTRACTION TEETH NUMBER 14, 19, 30, 31 (Mouth)  Patient Location: PACU  Anesthesia Type:General  Level of Consciousness: drowsy, patient cooperative, and responds to stimulation  Airway & Oxygen Therapy: Patient Spontanous Breathing and Patient connected to face mask oxygen  Post-op Assessment: Report given to RN, Post -op Vital signs reviewed and stable, and Patient moving all extremities X 4  Post vital signs: Reviewed and stable  Last Vitals:  Vitals Value Taken Time  BP 131/63 11/19/21 1007  Temp    Pulse 67 11/19/21 1009  Resp 24 11/19/21 1009  SpO2 91 % 11/19/21 1009  Vitals shown include unvalidated device data.  Last Pain:  Vitals:   11/19/21 0643  TempSrc:   PainSc: 0-No pain      Patients Stated Pain Goal: 0 (99/87/21 5872)  Complications: No notable events documented.

## 2021-11-19 NOTE — Anesthesia Postprocedure Evaluation (Signed)
Anesthesia Post Note  Patient: [name]  Procedure(s) Performed: DENTAL EXTRACTION TEETH NUMBER 14, 19, 30, 31 (Mouth)     Patient location during evaluation: PACU Anesthesia Type: General Level of consciousness: awake and alert Pain management: pain level controlled Vital Signs Assessment: post-procedure vital signs reviewed and stable Respiratory status: spontaneous breathing, nonlabored ventilation and respiratory function stable Cardiovascular status: blood pressure returned to baseline and stable Postop Assessment: no apparent nausea or vomiting Anesthetic complications: no   No notable events documented.  Last Vitals:  Vitals:   11/19/21 1055 11/19/21 1110  BP: 137/63 133/63  Pulse: 65 63  Resp: 13 19  Temp:  36.6 C  SpO2: 90% 96%    Last Pain:  Vitals:   11/19/21 1110  TempSrc:   PainSc: 0-No pain                 Lidia Collum

## 2021-11-19 NOTE — Op Note (Unsigned)
NAMESIYANA, ERNEY MEDICAL RECORD NO: [id number] ACCOUNT NO: [id number] DATE OF BIRTH: February 12, 1955 FACILITY[facility] LOCATION: [facility] PHYSICIAN: Gae Bon, DDS  Operative Report   DATE OF PROCEDURE: 11/19/2021  PREOPERATIVE DIAGNOSIS:  Nonrestorable teeth 14, 19, 30, 31, secondary to dental caries.  POSTOPERATIVE DIAGNOSIS:  Nonrestorable teeth 14, 19, 30, 31, secondary to dental caries plus nonrestorable tooth number 15.  PROCEDURE:  Extraction teeth 14, 15, 19, 30 and 31.  SURGEON:  Gae Bon, DDS  ANESTHESIA:  General, nasal intubation, Dr. Christella Hartigan, attending   DESCRIPTION OF PROCEDURE:  The patient was taken to the operating room and placed on the table in supine position.  General anesthesia was administered and nasal endotracheal tube was placed and secured. The eyes were protected and the patient was draped  for surgery.  Timeout was performed.  The posterior pharynx was suctioned and a throat pack was placed.  2% lidocaine 1:100,000 epinephrine was infiltrated around teeth #14 buccally and palatally and in an inferior alveolar block in the right and left  mandible.  A total of 8 mL of local anesthesia was utilized.  A bite block was placed on the right side of the mouth.  A 15 blade was used to make an incision around tooth #19.  The periosteum was reflected.  The tooth was elevated, but could not be  removed.  The Stryker handpiece was then used to section the roots and remove circumferential bone around them.  Then, the teeth roots were elevated with 301 elevator and removed with the rongeur and then the socket was curetted, irrigated and closed  with 3-0 chromic in the left maxilla. The 15 blade was used to make an incision around tooth #14, the tooth was elevated, but could not be mobilized.  The tooth was then sectioned with a Stryker handpiece and the roots were removed individually with 301  elevator and rongeurs.  It was noted that there was a deep carious lesion,  both distally and mesially along the root below the bone at tooth #15.  It was determined this tooth was nonrestorable and the tooth was removed using the upper Universal forceps.   The roots fractured upon attempted removal of the roots and then the roots were sectioned and removed with 301 elevator and rongeur.  Then, this 14 and 15 area socket was irrigated and curetted and closed with 3-0 chromic.  Then, the bite block was  repositioned to the other side of the mouth.  A 15 blade was used to make an incision around teeth #30 and 31. The roots were elevated, but could not be removed.  Stryker handpiece was used to section the teeth and remove the roots individually using 301  elevator and the rongeurs and the sockets were curetted, irrigated and closed with 3-0 chromic.  The oral cavity was then irrigated and suctioned.  The throat pack was removed.  The patient was left under care of anesthesia for extubation and  transported to recovery with plans for discharge home through day surgery.  ESTIMATED BLOOD LOSS:  Minimal.  COMPLICATIONS:  No complications.  SPECIMENS:  No specimens.  Counts were correct.   PUS D: 11/19/2021 9:47:24 am T: 11/19/2021 2:05:00 pm  JOB: [id number]/ [id number]

## 2021-11-20 ENCOUNTER — Encounter (HOSPITAL_COMMUNITY): Payer: Self-pay | Admitting: Oral Surgery

## 2021-11-25 ENCOUNTER — Ambulatory Visit: Payer: Medicare HMO | Attending: Cardiology

## 2021-11-25 DIAGNOSIS — I480 Paroxysmal atrial fibrillation: Secondary | ICD-10-CM

## 2021-11-25 DIAGNOSIS — Z5181 Encounter for therapeutic drug level monitoring: Secondary | ICD-10-CM | POA: Diagnosis not present

## 2021-11-25 DIAGNOSIS — Z954 Presence of other heart-valve replacement: Secondary | ICD-10-CM

## 2021-11-25 LAB — POCT INR: INR: 1.6 — AB (ref 2.0–3.0)

## 2021-11-25 NOTE — Patient Instructions (Signed)
Description   Continue Lovenox injections. Take 3 tablets today and 2 tablets tomorrow and then resume taking Warfarin 1.5 tablets daily.  Stay consistent with greens each week (2-3 times per week).  Recheck INR in 1 week. Call coumadin clinic for any changes in medications or up coming procedures. [phone].

## 2021-12-01 ENCOUNTER — Inpatient Hospital Stay (HOSPITAL_COMMUNITY)
Admission: EM | Admit: 2021-12-01 | Discharge: 2021-12-10 | DRG: 025 | Disposition: A | Discharge status: Rehabilitation Facility | Payer: Medicare HMO | Attending: Internal Medicine | Admitting: Internal Medicine

## 2021-12-01 DIAGNOSIS — I6201 Nontraumatic acute subdural hemorrhage: Principal | ICD-10-CM

## 2021-12-01 DIAGNOSIS — Z794 Long term (current) use of insulin: Secondary | ICD-10-CM

## 2021-12-01 DIAGNOSIS — D62 Acute posthemorrhagic anemia: Secondary | ICD-10-CM | POA: Diagnosis not present

## 2021-12-01 DIAGNOSIS — Z9889 Other specified postprocedural states: Secondary | ICD-10-CM | POA: Diagnosis not present

## 2021-12-01 DIAGNOSIS — Z8249 Family history of ischemic heart disease and other diseases of the circulatory system: Secondary | ICD-10-CM

## 2021-12-01 DIAGNOSIS — R569 Unspecified convulsions: Secondary | ICD-10-CM | POA: Diagnosis not present

## 2021-12-01 DIAGNOSIS — E7800 Pure hypercholesterolemia, unspecified: Secondary | ICD-10-CM | POA: Diagnosis present

## 2021-12-01 DIAGNOSIS — Z87828 Personal history of other (healed) physical injury and trauma: Secondary | ICD-10-CM | POA: Diagnosis not present

## 2021-12-01 DIAGNOSIS — E1122 Type 2 diabetes mellitus with diabetic chronic kidney disease: Secondary | ICD-10-CM | POA: Diagnosis present

## 2021-12-01 DIAGNOSIS — E669 Obesity, unspecified: Secondary | ICD-10-CM | POA: Diagnosis not present

## 2021-12-01 DIAGNOSIS — N1831 Chronic kidney disease, stage 3a: Secondary | ICD-10-CM | POA: Diagnosis present

## 2021-12-01 DIAGNOSIS — S065XAA Traumatic subdural hemorrhage with loss of consciousness status unknown, initial encounter: Secondary | ICD-10-CM | POA: Diagnosis not present

## 2021-12-01 DIAGNOSIS — I4891 Unspecified atrial fibrillation: Secondary | ICD-10-CM | POA: Diagnosis present

## 2021-12-01 DIAGNOSIS — I4819 Other persistent atrial fibrillation: Secondary | ICD-10-CM | POA: Diagnosis present

## 2021-12-01 DIAGNOSIS — R4701 Aphasia: Secondary | ICD-10-CM | POA: Diagnosis present

## 2021-12-01 DIAGNOSIS — G9341 Metabolic encephalopathy: Secondary | ICD-10-CM | POA: Diagnosis not present

## 2021-12-01 DIAGNOSIS — J45909 Unspecified asthma, uncomplicated: Secondary | ICD-10-CM | POA: Diagnosis present

## 2021-12-01 DIAGNOSIS — G4730 Sleep apnea, unspecified: Secondary | ICD-10-CM | POA: Diagnosis not present

## 2021-12-01 DIAGNOSIS — I130 Hypertensive heart and chronic kidney disease with heart failure and stage 1 through stage 4 chronic kidney disease, or unspecified chronic kidney disease: Secondary | ICD-10-CM | POA: Diagnosis present

## 2021-12-01 DIAGNOSIS — Z7983 Long term (current) use of bisphosphonates: Secondary | ICD-10-CM

## 2021-12-01 DIAGNOSIS — I447 Left bundle-branch block, unspecified: Secondary | ICD-10-CM | POA: Diagnosis present

## 2021-12-01 DIAGNOSIS — I10 Essential (primary) hypertension: Secondary | ICD-10-CM | POA: Diagnosis not present

## 2021-12-01 DIAGNOSIS — R7989 Other specified abnormal findings of blood chemistry: Secondary | ICD-10-CM | POA: Diagnosis not present

## 2021-12-01 DIAGNOSIS — G9389 Other specified disorders of brain: Secondary | ICD-10-CM | POA: Diagnosis not present

## 2021-12-01 DIAGNOSIS — D631 Anemia in chronic kidney disease: Secondary | ICD-10-CM | POA: Diagnosis present

## 2021-12-01 DIAGNOSIS — I6203 Nontraumatic chronic subdural hemorrhage: Secondary | ICD-10-CM | POA: Diagnosis not present

## 2021-12-01 DIAGNOSIS — E1169 Type 2 diabetes mellitus with other specified complication: Secondary | ICD-10-CM | POA: Diagnosis present

## 2021-12-01 DIAGNOSIS — I5032 Chronic diastolic (congestive) heart failure: Secondary | ICD-10-CM | POA: Diagnosis present

## 2021-12-01 DIAGNOSIS — Z833 Family history of diabetes mellitus: Secondary | ICD-10-CM

## 2021-12-01 DIAGNOSIS — Z7901 Long term (current) use of anticoagulants: Secondary | ICD-10-CM

## 2021-12-01 DIAGNOSIS — Z90710 Acquired absence of both cervix and uterus: Secondary | ICD-10-CM

## 2021-12-01 DIAGNOSIS — Z923 Personal history of irradiation: Secondary | ICD-10-CM | POA: Diagnosis not present

## 2021-12-01 DIAGNOSIS — R339 Retention of urine, unspecified: Secondary | ICD-10-CM | POA: Diagnosis not present

## 2021-12-01 DIAGNOSIS — S065X0A Traumatic subdural hemorrhage without loss of consciousness, initial encounter: Secondary | ICD-10-CM | POA: Diagnosis not present

## 2021-12-01 DIAGNOSIS — K068 Other specified disorders of gingiva and edentulous alveolar ridge: Secondary | ICD-10-CM | POA: Diagnosis not present

## 2021-12-01 DIAGNOSIS — I152 Hypertension secondary to endocrine disorders: Secondary | ICD-10-CM | POA: Diagnosis present

## 2021-12-01 DIAGNOSIS — Z825 Family history of asthma and other chronic lower respiratory diseases: Secondary | ICD-10-CM

## 2021-12-01 DIAGNOSIS — Z79899 Other long term (current) drug therapy: Secondary | ICD-10-CM

## 2021-12-01 DIAGNOSIS — Z7984 Long term (current) use of oral hypoglycemic drugs: Secondary | ICD-10-CM

## 2021-12-01 DIAGNOSIS — I4811 Longstanding persistent atrial fibrillation: Secondary | ICD-10-CM | POA: Diagnosis not present

## 2021-12-01 DIAGNOSIS — I69298 Other sequelae of other nontraumatic intracranial hemorrhage: Secondary | ICD-10-CM | POA: Diagnosis not present

## 2021-12-01 DIAGNOSIS — Z8542 Personal history of malignant neoplasm of other parts of uterus: Secondary | ICD-10-CM

## 2021-12-01 DIAGNOSIS — R4182 Altered mental status, unspecified: Secondary | ICD-10-CM | POA: Diagnosis not present

## 2021-12-01 DIAGNOSIS — E119 Type 2 diabetes mellitus without complications: Secondary | ICD-10-CM

## 2021-12-01 DIAGNOSIS — R414 Neurologic neglect syndrome: Secondary | ICD-10-CM | POA: Diagnosis not present

## 2021-12-01 DIAGNOSIS — R32 Unspecified urinary incontinence: Secondary | ICD-10-CM | POA: Diagnosis not present

## 2021-12-01 DIAGNOSIS — Z952 Presence of prosthetic heart valve: Secondary | ICD-10-CM

## 2021-12-01 DIAGNOSIS — I342 Nonrheumatic mitral (valve) stenosis: Secondary | ICD-10-CM | POA: Diagnosis not present

## 2021-12-01 DIAGNOSIS — N179 Acute kidney failure, unspecified: Secondary | ICD-10-CM | POA: Diagnosis present

## 2021-12-01 DIAGNOSIS — Z954 Presence of other heart-valve replacement: Secondary | ICD-10-CM

## 2021-12-01 DIAGNOSIS — R519 Headache, unspecified: Secondary | ICD-10-CM | POA: Diagnosis not present

## 2021-12-01 DIAGNOSIS — I340 Nonrheumatic mitral (valve) insufficiency: Secondary | ICD-10-CM | POA: Diagnosis not present

## 2021-12-01 DIAGNOSIS — I6200 Nontraumatic subdural hemorrhage, unspecified: Secondary | ICD-10-CM | POA: Diagnosis present

## 2021-12-01 DIAGNOSIS — E1159 Type 2 diabetes mellitus with other circulatory complications: Secondary | ICD-10-CM

## 2021-12-01 NOTE — ED Triage Notes (Signed)
Pt had some teeth removed on 11/10. Pts mouth is still bleeding since 11/10.

## 2021-12-02 ENCOUNTER — Emergency Department (HOSPITAL_COMMUNITY): Payer: Medicare HMO

## 2021-12-02 ENCOUNTER — Observation Stay (HOSPITAL_COMMUNITY): Payer: Medicare HMO

## 2021-12-02 ENCOUNTER — Other Ambulatory Visit: Payer: Self-pay

## 2021-12-02 ENCOUNTER — Encounter (HOSPITAL_COMMUNITY): Payer: Self-pay

## 2021-12-02 DIAGNOSIS — D631 Anemia in chronic kidney disease: Secondary | ICD-10-CM | POA: Diagnosis present

## 2021-12-02 DIAGNOSIS — I340 Nonrheumatic mitral (valve) insufficiency: Secondary | ICD-10-CM

## 2021-12-02 DIAGNOSIS — Z952 Presence of prosthetic heart valve: Secondary | ICD-10-CM | POA: Diagnosis not present

## 2021-12-02 DIAGNOSIS — I6200 Nontraumatic subdural hemorrhage, unspecified: Secondary | ICD-10-CM | POA: Diagnosis present

## 2021-12-02 DIAGNOSIS — I152 Hypertension secondary to endocrine disorders: Secondary | ICD-10-CM | POA: Diagnosis present

## 2021-12-02 DIAGNOSIS — G9389 Other specified disorders of brain: Secondary | ICD-10-CM | POA: Diagnosis not present

## 2021-12-02 DIAGNOSIS — E1169 Type 2 diabetes mellitus with other specified complication: Secondary | ICD-10-CM

## 2021-12-02 DIAGNOSIS — Z7901 Long term (current) use of anticoagulants: Secondary | ICD-10-CM | POA: Diagnosis not present

## 2021-12-02 DIAGNOSIS — N179 Acute kidney failure, unspecified: Secondary | ICD-10-CM | POA: Diagnosis present

## 2021-12-02 DIAGNOSIS — R4701 Aphasia: Secondary | ICD-10-CM | POA: Diagnosis present

## 2021-12-02 DIAGNOSIS — I342 Nonrheumatic mitral (valve) stenosis: Secondary | ICD-10-CM

## 2021-12-02 DIAGNOSIS — R4182 Altered mental status, unspecified: Secondary | ICD-10-CM | POA: Diagnosis not present

## 2021-12-02 DIAGNOSIS — Z954 Presence of other heart-valve replacement: Secondary | ICD-10-CM

## 2021-12-02 DIAGNOSIS — J45909 Unspecified asthma, uncomplicated: Secondary | ICD-10-CM | POA: Diagnosis present

## 2021-12-02 DIAGNOSIS — R339 Retention of urine, unspecified: Secondary | ICD-10-CM | POA: Diagnosis not present

## 2021-12-02 DIAGNOSIS — E669 Obesity, unspecified: Secondary | ICD-10-CM | POA: Diagnosis not present

## 2021-12-02 DIAGNOSIS — I4811 Longstanding persistent atrial fibrillation: Secondary | ICD-10-CM | POA: Diagnosis not present

## 2021-12-02 DIAGNOSIS — N1831 Chronic kidney disease, stage 3a: Secondary | ICD-10-CM | POA: Diagnosis present

## 2021-12-02 DIAGNOSIS — I130 Hypertensive heart and chronic kidney disease with heart failure and stage 1 through stage 4 chronic kidney disease, or unspecified chronic kidney disease: Secondary | ICD-10-CM | POA: Diagnosis present

## 2021-12-02 DIAGNOSIS — S065XAA Traumatic subdural hemorrhage with loss of consciousness status unknown, initial encounter: Secondary | ICD-10-CM | POA: Diagnosis present

## 2021-12-02 DIAGNOSIS — G4730 Sleep apnea, unspecified: Secondary | ICD-10-CM | POA: Diagnosis not present

## 2021-12-02 DIAGNOSIS — R569 Unspecified convulsions: Secondary | ICD-10-CM | POA: Diagnosis not present

## 2021-12-02 DIAGNOSIS — Z9889 Other specified postprocedural states: Secondary | ICD-10-CM | POA: Diagnosis not present

## 2021-12-02 DIAGNOSIS — Z794 Long term (current) use of insulin: Secondary | ICD-10-CM

## 2021-12-02 DIAGNOSIS — I10 Essential (primary) hypertension: Secondary | ICD-10-CM | POA: Diagnosis not present

## 2021-12-02 DIAGNOSIS — E1122 Type 2 diabetes mellitus with diabetic chronic kidney disease: Secondary | ICD-10-CM | POA: Diagnosis present

## 2021-12-02 DIAGNOSIS — Z79899 Other long term (current) drug therapy: Secondary | ICD-10-CM | POA: Diagnosis not present

## 2021-12-02 DIAGNOSIS — I447 Left bundle-branch block, unspecified: Secondary | ICD-10-CM | POA: Diagnosis present

## 2021-12-02 DIAGNOSIS — G9341 Metabolic encephalopathy: Secondary | ICD-10-CM | POA: Diagnosis not present

## 2021-12-02 DIAGNOSIS — I4891 Unspecified atrial fibrillation: Secondary | ICD-10-CM | POA: Diagnosis not present

## 2021-12-02 DIAGNOSIS — Z7983 Long term (current) use of bisphosphonates: Secondary | ICD-10-CM | POA: Diagnosis not present

## 2021-12-02 DIAGNOSIS — I5032 Chronic diastolic (congestive) heart failure: Secondary | ICD-10-CM | POA: Diagnosis present

## 2021-12-02 DIAGNOSIS — Z87828 Personal history of other (healed) physical injury and trauma: Secondary | ICD-10-CM | POA: Diagnosis not present

## 2021-12-02 DIAGNOSIS — Z8249 Family history of ischemic heart disease and other diseases of the circulatory system: Secondary | ICD-10-CM | POA: Diagnosis not present

## 2021-12-02 DIAGNOSIS — Z7984 Long term (current) use of oral hypoglycemic drugs: Secondary | ICD-10-CM | POA: Diagnosis not present

## 2021-12-02 DIAGNOSIS — I4819 Other persistent atrial fibrillation: Secondary | ICD-10-CM | POA: Diagnosis present

## 2021-12-02 DIAGNOSIS — I6201 Nontraumatic acute subdural hemorrhage: Secondary | ICD-10-CM | POA: Diagnosis present

## 2021-12-02 DIAGNOSIS — Z923 Personal history of irradiation: Secondary | ICD-10-CM | POA: Diagnosis not present

## 2021-12-02 DIAGNOSIS — R7989 Other specified abnormal findings of blood chemistry: Secondary | ICD-10-CM | POA: Diagnosis not present

## 2021-12-02 DIAGNOSIS — R519 Headache, unspecified: Secondary | ICD-10-CM | POA: Diagnosis not present

## 2021-12-02 DIAGNOSIS — S065X0A Traumatic subdural hemorrhage without loss of consciousness, initial encounter: Secondary | ICD-10-CM | POA: Diagnosis not present

## 2021-12-02 DIAGNOSIS — E7800 Pure hypercholesterolemia, unspecified: Secondary | ICD-10-CM | POA: Diagnosis present

## 2021-12-02 DIAGNOSIS — I69298 Other sequelae of other nontraumatic intracranial hemorrhage: Secondary | ICD-10-CM | POA: Diagnosis not present

## 2021-12-02 DIAGNOSIS — D62 Acute posthemorrhagic anemia: Secondary | ICD-10-CM | POA: Diagnosis not present

## 2021-12-02 LAB — BASIC METABOLIC PANEL
Anion gap: 10 (ref 5–15)
BUN: 18 mg/dL (ref 8–23)
CO2: 24 mmol/L (ref 22–32)
Calcium: 9.1 mg/dL (ref 8.9–10.3)
Chloride: 105 mmol/L (ref 98–111)
Creatinine, Ser: 0.96 mg/dL (ref 0.44–1.00)
GFR, Estimated: >60 mL/min (ref >60)
Glucose, Bld: 108 mg/dL — ABNORMAL HIGH (ref 70–99)
Potassium: 4.1 mmol/L (ref 3.5–5.1)
Sodium: 139 mmol/L (ref 135–145)

## 2021-12-02 LAB — COMPREHENSIVE METABOLIC PANEL
ALT: 25 U/L (ref 0–44)
AST: 32 U/L (ref 15–41)
Albumin: 3.8 g/dL (ref 3.5–5.0)
Alkaline Phosphatase: 69 U/L (ref 38–126)
Anion gap: 7 (ref 5–15)
BUN: 15 mg/dL (ref 8–23)
CO2: 26 mmol/L (ref 22–32)
Calcium: 9 mg/dL (ref 8.9–10.3)
Chloride: 106 mmol/L (ref 98–111)
Creatinine, Ser: 0.78 mg/dL (ref 0.44–1.00)
GFR, Estimated: >60 mL/min (ref >60)
Glucose, Bld: 96 mg/dL (ref 70–99)
Potassium: 3.7 mmol/L (ref 3.5–5.1)
Sodium: 139 mmol/L (ref 135–145)
Total Bilirubin: 1.2 mg/dL (ref 0.3–1.2)
Total Protein: 8.2 g/dL — ABNORMAL HIGH (ref 6.5–8.1)

## 2021-12-02 LAB — HEPATIC FUNCTION PANEL
ALT: 24 U/L (ref 0–44)
AST: 35 U/L (ref 15–41)
Albumin: 3.9 g/dL (ref 3.5–5.0)
Alkaline Phosphatase: 71 U/L (ref 38–126)
Bilirubin, Direct: 0.2 mg/dL (ref 0.0–0.2)
Indirect Bilirubin: 0.7 mg/dL (ref 0.3–0.9)
Total Bilirubin: 0.9 mg/dL (ref 0.3–1.2)
Total Protein: 8.2 g/dL — ABNORMAL HIGH (ref 6.5–8.1)

## 2021-12-02 LAB — TYPE AND SCREEN
ABO/RH(D): O POS
Antibody Screen: POSITIVE

## 2021-12-02 LAB — CBC WITH DIFFERENTIAL/PLATELET
Abs Immature Granulocytes: 0.04 10*3/uL (ref 0.00–0.07)
Abs Immature Granulocytes: 0.04 10*3/uL (ref 0.00–0.07)
Basophils Absolute: 0 10*3/uL (ref 0.0–0.1)
Basophils Absolute: 0 10*3/uL (ref 0.0–0.1)
Basophils Relative: 0 %
Basophils Relative: 0 %
Eosinophils Absolute: 0 10*3/uL (ref 0.0–0.5)
Eosinophils Absolute: 0.1 10*3/uL (ref 0.0–0.5)
Eosinophils Relative: 0 %
Eosinophils Relative: 1 %
HCT: 32.4 % — ABNORMAL LOW (ref 36.0–46.0)
HCT: 31.7 % — ABNORMAL LOW (ref 36.0–46.0)
Hemoglobin: 10 g/dL — ABNORMAL LOW (ref 12.0–15.0)
Hemoglobin: 9.9 g/dL — ABNORMAL LOW (ref 12.0–15.0)
Immature Granulocytes: 0 %
Immature Granulocytes: 0 %
Lymphocytes Relative: 20 %
Lymphocytes Relative: 17 %
Lymphs Abs: 2 10*3/uL (ref 0.7–4.0)
Lymphs Abs: 1.7 10*3/uL (ref 0.7–4.0)
MCH: 31.3 pg (ref 26.0–34.0)
MCH: 31.1 pg (ref 26.0–34.0)
MCHC: 30.9 g/dL (ref 30.0–36.0)
MCHC: 31.2 g/dL (ref 30.0–36.0)
MCV: 101.3 fL — ABNORMAL HIGH (ref 80.0–100.0)
MCV: 99.7 fL (ref 80.0–100.0)
Monocytes Absolute: 0.8 10*3/uL (ref 0.1–1.0)
Monocytes Absolute: 0.7 10*3/uL (ref 0.1–1.0)
Monocytes Relative: 8 %
Monocytes Relative: 7 %
Neutro Abs: 7.4 10*3/uL (ref 1.7–7.7)
Neutro Abs: 7.4 10*3/uL (ref 1.7–7.7)
Neutrophils Relative %: 72 %
Neutrophils Relative %: 75 %
Platelets: 203 10*3/uL (ref 150–400)
Platelets: 194 10*3/uL (ref 150–400)
RBC: 3.2 MIL/uL — ABNORMAL LOW (ref 3.87–5.11)
RBC: 3.18 MIL/uL — ABNORMAL LOW (ref 3.87–5.11)
RDW: 13.5 % (ref 11.5–15.5)
RDW: 13.4 % (ref 11.5–15.5)
WBC: 10.3 10*3/uL (ref 4.0–10.5)
WBC: 10 10*3/uL (ref 4.0–10.5)
nRBC: 0 % (ref 0.0–0.2)
nRBC: 0 % (ref 0.0–0.2)

## 2021-12-02 LAB — URINALYSIS, ROUTINE W REFLEX MICROSCOPIC
Glucose, UA: NEGATIVE mg/dL
Ketones, ur: 15 mg/dL — AB
Nitrite: NEGATIVE
Protein, ur: 100 mg/dL — AB
Specific Gravity, Urine: >1.03 — ABNORMAL HIGH (ref 1.005–1.030)
pH: 5.5 (ref 5.0–8.0)

## 2021-12-02 LAB — PROTIME-INR
INR: 2.4 — ABNORMAL HIGH (ref 0.8–1.2)
INR: 1.4 — ABNORMAL HIGH (ref 0.8–1.2)
Prothrombin Time: 26 seconds — ABNORMAL HIGH (ref 11.4–15.2)
Prothrombin Time: 16.8 seconds — ABNORMAL HIGH (ref 11.4–15.2)

## 2021-12-02 LAB — GLUCOSE, CAPILLARY
Glucose-Capillary: 101 mg/dL — ABNORMAL HIGH (ref 70–99)
Glucose-Capillary: 160 mg/dL — ABNORMAL HIGH (ref 70–99)

## 2021-12-02 LAB — URINALYSIS, MICROSCOPIC (REFLEX)

## 2021-12-02 LAB — HIV ANTIBODY (ROUTINE TESTING W REFLEX): HIV Screen 4th Generation wRfx: NONREACTIVE

## 2021-12-02 LAB — CBG MONITORING, ED
Glucose-Capillary: 101 mg/dL — ABNORMAL HIGH (ref 70–99)
Glucose-Capillary: 102 mg/dL — ABNORMAL HIGH (ref 70–99)

## 2021-12-02 LAB — SURGICAL PCR SCREEN
MRSA, PCR: NEGATIVE
Staphylococcus aureus: NEGATIVE

## 2021-12-02 LAB — APTT: aPTT: 52 seconds — ABNORMAL HIGH (ref 24–36)

## 2021-12-02 LAB — MAGNESIUM: Magnesium: 1.9 mg/dL (ref 1.7–2.4)

## 2021-12-02 MED ORDER — TRANEXAMIC ACID FOR EPISTAXIS
500.0000 mg | Freq: Once | TOPICAL | Status: AC
  Administered 2021-12-02: 500 mg via TOPICAL
  Filled 2021-12-02 (×2): qty 10

## 2021-12-02 MED ORDER — ACETAMINOPHEN 325 MG PO TABS
650.0000 mg | ORAL_TABLET | Freq: Four times a day (QID) | ORAL | Status: DC | PRN
  Administered 2021-12-04 – 2021-12-09 (×4): 650 mg via ORAL
  Filled 2021-12-02 (×4): qty 2

## 2021-12-02 MED ORDER — INSULIN ASPART 100 UNIT/ML IJ SOLN
0.0000 [IU] | Freq: Three times a day (TID) | INTRAMUSCULAR | Status: DC
  Administered 2021-12-03: 5 [IU] via SUBCUTANEOUS
  Administered 2021-12-03: 2 [IU] via SUBCUTANEOUS
  Administered 2021-12-04: 3 [IU] via SUBCUTANEOUS
  Administered 2021-12-04 (×2): 2 [IU] via SUBCUTANEOUS
  Administered 2021-12-05 (×2): 3 [IU] via SUBCUTANEOUS
  Administered 2021-12-06 (×3): 2 [IU] via SUBCUTANEOUS
  Administered 2021-12-07 (×2): 5 [IU] via SUBCUTANEOUS
  Administered 2021-12-07 – 2021-12-08 (×2): 2 [IU] via SUBCUTANEOUS
  Administered 2021-12-08 – 2021-12-09 (×3): 3 [IU] via SUBCUTANEOUS
  Administered 2021-12-09: 2 [IU] via SUBCUTANEOUS
  Administered 2021-12-10: 3 [IU] via SUBCUTANEOUS
  Administered 2021-12-10: 2 [IU] via SUBCUTANEOUS
  Filled 2021-12-02: qty 0.15

## 2021-12-02 MED ORDER — CARVEDILOL 12.5 MG PO TABS
12.5000 mg | ORAL_TABLET | Freq: Two times a day (BID) | ORAL | Status: DC
  Administered 2021-12-02 – 2021-12-03 (×3): 12.5 mg via ORAL
  Filled 2021-12-02 (×3): qty 1

## 2021-12-02 MED ORDER — INSULIN GLARGINE-YFGN 100 UNIT/ML ~~LOC~~ SOLN
20.0000 [IU] | Freq: Every day | SUBCUTANEOUS | Status: DC
  Administered 2021-12-02: 20 [IU] via SUBCUTANEOUS
  Filled 2021-12-02 (×3): qty 0.2

## 2021-12-02 MED ORDER — ONDANSETRON HCL 4 MG/2ML IJ SOLN: 4.0000 mg | Freq: Four times a day (QID) | INTRAMUSCULAR | Status: DC | PRN

## 2021-12-02 MED ORDER — CEFAZOLIN SODIUM-DEXTROSE 2-4 GM/100ML-% IV SOLN
2.0000 g | INTRAVENOUS | Status: AC
  Administered 2021-12-03: 2 g via INTRAVENOUS
  Filled 2021-12-02: qty 100

## 2021-12-02 MED ORDER — LEVETIRACETAM IN NACL 500 MG/100ML IV SOLN
500.0000 mg | Freq: Two times a day (BID) | INTRAVENOUS | Status: DC
  Administered 2021-12-02: 500 mg via INTRAVENOUS
  Filled 2021-12-02: qty 100

## 2021-12-02 MED ORDER — VITAMIN K1 10 MG/ML IJ SOLN
10.0000 mg | INTRAVENOUS | Status: AC
  Administered 2021-12-02: 10 mg via INTRAVENOUS
  Filled 2021-12-02: qty 1

## 2021-12-02 MED ORDER — ATORVASTATIN CALCIUM 40 MG PO TABS
40.0000 mg | ORAL_TABLET | Freq: Every day | ORAL | Status: DC
  Administered 2021-12-02 – 2021-12-10 (×8): 40 mg via ORAL
  Filled 2021-12-02 (×8): qty 1

## 2021-12-02 MED ORDER — IRBESARTAN 300 MG PO TABS
300.0000 mg | ORAL_TABLET | Freq: Every day | ORAL | Status: DC
  Administered 2021-12-04 – 2021-12-10 (×7): 300 mg via ORAL
  Filled 2021-12-02: qty 2
  Filled 2021-12-02 (×4): qty 1
  Filled 2021-12-02: qty 2
  Filled 2021-12-02: qty 1
  Filled 2021-12-02: qty 2

## 2021-12-02 MED ORDER — INSULIN ASPART 100 UNIT/ML IJ SOLN
0.0000 [IU] | Freq: Every day | INTRAMUSCULAR | Status: DC
  Administered 2021-12-03: 3 [IU] via SUBCUTANEOUS
  Filled 2021-12-02: qty 0.05

## 2021-12-02 MED ORDER — MUPIROCIN 2 % EX OINT
1.0000 | TOPICAL_OINTMENT | Freq: Two times a day (BID) | CUTANEOUS | Status: DC
  Filled 2021-12-02: qty 22

## 2021-12-02 MED ORDER — ONDANSETRON HCL 4 MG PO TABS: 4.0000 mg | ORAL_TABLET | Freq: Four times a day (QID) | ORAL | Status: DC | PRN

## 2021-12-02 MED ORDER — PROTHROMBIN COMPLEX CONC HUMAN 500 UNITS IV KIT
1568.0000 [IU] | PACK | Status: AC
  Administered 2021-12-02: 1568 [IU] via INTRAVENOUS
  Filled 2021-12-02: qty 1000

## 2021-12-02 MED ORDER — ACETAMINOPHEN 650 MG RE SUPP: 650.0000 mg | Freq: Four times a day (QID) | RECTAL | Status: DC | PRN

## 2021-12-02 NOTE — Subjective & Objective (Signed)
CC: bleeding gums HPI:  66 year old African-American female history of A-fib, history of mechanical mitral valve placement on chronic Coumadin, CKD stage IIIa, type 2 diabetes, chronic diastolic heart failure presents to the ER today with chief complaint of bleeding gums.  Patient had several teeth removed by oral surgery on 11/19/2021.  She states that her gums have been bleeding since her operation.  She has not yet called the oral surgeon that removed her teeth to let them know that she has had continued bleeding of her gums.  She presented to the ER today due to complaints of bleeding gums.  CT head was performed given the fact that she is on Coumadin for her mechanical mitral valve was found to have acute subdural hematoma with an 8 mm left to right midline shift.  Patient denies any recent head trauma or falls.  EDP discussed the case with neurosurgery who wanted the patient admitted to hospitalist service for her acute subdural hematoma with midline shift.  EDP is ready ordered Kcentra to reverse her anticoagulation.  Tried hospitalist contacted for admission.

## 2021-12-02 NOTE — Anesthesia Preprocedure Evaluation (Addendum)
Anesthesia Evaluation  Patient identified by MRN, date of birth, ID bandGeneral Assessment Comment: Awake, some confusion per daughter at bedside   Reviewed: Allergy & Precautions, NPO status , Patient's Chart, lab work & pertinent test results  History of Anesthesia Complications Negative for: history of anesthetic complications  Airway Mallampati: III  TM Distance: >3 FB Neck ROM: Full    Dental  (+) Dental Advisory Given   Pulmonary asthma , sleep apnea    Pulmonary exam normal        Cardiovascular hypertension, Pt. on medications Normal cardiovascular exam+ dysrhythmias Atrial Fibrillation + Valvular Problems/Murmurs (s/p tricuspid repair, metallic MVR)    '23 TTE - EF 60 to 65%. There is mildly elevated pulmonary artery systolic pressure. Left atrial size was moderately dilated. Post 31 mm Sorin mechanical valve normal function bi leaflet . The mitral valve has been repaired/replaced. Trivial mitral valve  regurgitation.cThe tricuspid valve is has been repaired/replaced. Trivial AI.     Neuro/Psych  SDH  CVA, Residual Symptoms  negative psych ROS   GI/Hepatic negative GI ROS, Neg liver ROS,,,  Endo/Other  diabetes, Type 2, Oral Hypoglycemic Agents   Obesity   Renal/GU negative Renal ROS     Musculoskeletal negative musculoskeletal ROS (+)    Abdominal   Peds  Hematology  (+) Blood dyscrasia, anemia  On coumadin    Anesthesia Other Findings   Reproductive/Obstetrics                              Anesthesia Physical Anesthesia Plan  ASA: 4  Anesthesia Plan: General   Post-op Pain Management: Tylenol PO (pre-op)*   Induction: Intravenous  PONV Risk Score and Plan: 3 and Treatment may vary due to age or medical condition, Ondansetron and Dexamethasone  Airway Management Planned: Oral ETT  Additional Equipment: ClearSight  Intra-op Plan:   Post-operative Plan:  Extubation in OR  Informed Consent: I have reviewed the patients History and Physical, chart, labs and discussed the procedure including the risks, benefits and alternatives for the proposed anesthesia with the patient or authorized representative who has indicated his/her understanding and acceptance.     Dental advisory given and Consent reviewed with POA  Plan Discussed with: CRNA and Anesthesiologist  Anesthesia Plan Comments: (2 large bore PIV)         Anesthesia Quick Evaluation

## 2021-12-02 NOTE — ED Provider Notes (Addendum)
[facility] DEPT Provider Note   CSN: [id number] Arrival date & time: 12/01/21  2349     History  Chief Complaint  Patient presents with   Dental Problem    BRIDGID PRINTZ is a 66 y.o. female.  The history is provided by the patient and a relative.  She has history of hypertension, diabetes, hyperlipidemia, atrial fibrillation anticoagulated on warfarin, diastolic heart failure, prosthetic mitral valve, stroke and had multiple dental extractions done on November 10.  She presents today with bleeding from the sites of the extractions.  History is very difficult to obtain, but it seems that it either started today or got significantly worse today.  Family also relates that she has been somnolent and confused today.  She was started on benzonatate for cough with prescription written on 11/25/2021.  She is complaining of a headache.  She denies fever.  She denies nausea or vomiting.  She had restarted warfarin following her procedure.   Home Medications Prior to Admission medications   Medication Sig Start Date End Date Taking? Authorizing Provider  acetaminophen (TYLENOL) 500 MG tablet Take 1,000-1,500 mg by mouth every 8 (eight) hours as needed for pain. 01/05/19   [provider]  albuterol (PROVENTIL HFA) 108 (90 Base) MCG/ACT inhaler Inhale 2 puffs into the lungs as needed for shortness of breath.    [provider]  alendronate (FOSAMAX) 70 MG tablet Take 70 mg by mouth once a week. Take with a full glass of water on an empty stomach.    [provider]  amiodarone (PACERONE) 200 MG tablet TAKE 1 TABLET(200 MG) BY MOUTH DAILY 11/24/20   Jettie Booze, MD  amLODipine (NORVASC) 10 MG tablet TAKE 1 TABLET EVERY DAY 07/15/21   Jettie Booze, MD  amoxicillin (AMOXIL) 500 MG capsule Take 1 capsule (500 mg total) by mouth 2 (two) times daily. 11/19/21   Diona Browner, DMD  atorvastatin (LIPITOR) 40 MG tablet TAKE 1 TABLET(40  MG) BY MOUTH DAILY 11/24/20   Jettie Booze, MD  calcium carbonate (OS-CAL - DOSED IN MG OF ELEMENTAL CALCIUM) 1250 MG tablet Take 1 tablet by mouth daily.    [provider]  carvedilol (COREG) 12.5 MG tablet TAKE 1 TABLET TWICE DAILY WITH MEALS 09/27/21   Jettie Booze, MD  Cholecalciferol (VITAMIN D) 1000 UNITS capsule Take 1,000 Units by mouth daily.    [provider]  cyclobenzaprine (FLEXERIL) 10 MG tablet Take 10 mg by mouth daily.  07/24/13   [provider]  enoxaparin (LOVENOX) 100 MG/ML injection Inject 1 mL (100 mg total) into the skin every 12 (twelve) hours. 11/21/21   Diona Browner, DMD  furosemide (LASIX) 40 MG tablet Take 40 mg by mouth daily as needed for fluid or edema.    [provider]  HYDROcodone-acetaminophen (NORCO) 5-325 MG tablet Take 1 tablet by mouth every 4 (four) hours as needed for moderate pain. 11/19/21   Diona Browner, DMD  insulin glargine (LANTUS) 100 UNIT/ML injection Inject 30-35 Units into the skin See admin instructions. Inject 30 units into the skin in the morning and 35 units at bedtime.    [provider]  irbesartan (AVAPRO) 300 MG tablet TAKE 1 TABLET EVERY DAY 07/15/21   Jettie Booze, MD  metFORMIN (GLUCOPHAGE) 500 MG tablet Take 500 mg by mouth 2 (two) times daily with a meal.  11/26/12   [provider]  methocarbamol (ROBAXIN) 500 MG tablet Take 500 mg by  mouth every 8 (eight) hours as needed for muscle spasms.     [provider]  senna (SENOKOT) 8.6 MG TABS tablet Take 1 tablet (8.6 mg total) by mouth daily as needed for up to 15 doses for mild constipation. 01/05/19   [name], MD  warfarin (COUMADIN) 4 MG tablet TAKE 1 AND 1/2 TABLETS TO 2 TABLETS BY MOUTH DAILY AS DIRECTED BY COUMADIN CLINIC Patient taking differently: Take 6 mg by mouth at bedtime.  BY MOUTH DAILY AS DIRECTED BY COUMADIN CLINIC 09/09/19   Jettie Booze, MD      Allergies     Chlorhexidine    Review of Systems   Review of Systems  All other systems reviewed and are negative.   Physical Exam Updated Vital Signs BP 122/74   Pulse 97   Temp 97.7 F (36.5 C) (Oral)   Resp 15   SpO2 95%  Physical Exam Vitals and nursing note reviewed.   66 year old female, resting comfortably and in no acute distress. Vital signs are normal. Oxygen saturation is 95%, which is normal. Head is normocephalic and atraumatic. PERRLA, EOMI. Clot is present at the site of dental extraction of teeth #30/31.  When this clot is removed, steady oozing of blood is noted.  There is also slight oozing of blood the site of extraction of tooth #19.  Extraction site of teeth 14/15 appears dry. Neck is nontender and supple without adenopathy or JVD. Back is nontender and there is no CVA tenderness. Lungs are clear without rales, wheezes, or rhonchi. Chest is nontender. Heart has regular rate and rhythm with 2/56 systolic ejection murmur and prosthetic valve click noted. Abdomen is soft, flat, nontender. Extremities have no cyanosis or edema, full range of motion is present. Skin is warm and dry without rash. Neurologic: Awake and alert, speech is normal, cranial nerves are intact, moves all extremities equally.  ED Results / Procedures / Treatments   Labs (all labs ordered are listed, but only abnormal results are displayed) Labs Reviewed  CBC WITH DIFFERENTIAL/PLATELET - Abnormal; Notable for the following components:      Result Value   RBC 3.20 (*)    Hemoglobin 10.0 (*)    HCT 32.4 (*)    MCV 101.3 (*)    All other components within normal limits  BASIC METABOLIC PANEL - Abnormal; Notable for the following components:   Glucose, Bld 108 (*)    All other components within normal limits  PROTIME-INR - Abnormal; Notable for the following components:   Prothrombin Time 26.0 (*)    INR 2.4 (*)    All other components within normal limits  HEPATIC FUNCTION PANEL - Abnormal;  Notable for the following components:   Total Protein 8.2 (*)    All other components within normal limits  URINALYSIS, ROUTINE W REFLEX MICROSCOPIC - Abnormal; Notable for the following components:   Specific Gravity, Urine >1.030 (*)    Hgb urine dipstick LARGE (*)    Bilirubin Urine SMALL (*)    Ketones, ur 15 (*)    Protein, ur 100 (*)    Leukocytes,Ua TRACE (*)    All other components within normal limits  URINALYSIS, MICROSCOPIC (REFLEX) - Abnormal; Notable for the following components:   Bacteria, UA RARE (*)    All other components within normal limits  APTT - Abnormal; Notable for the following components:   aPTT 52 (*)    All other components within normal limits  PROTIME-INR - Abnormal;  Notable for the following components:   Prothrombin Time 16.8 (*)    INR 1.4 (*)    All other components within normal limits  TYPE AND SCREEN   Radiology CT Head Wo Contrast  Result Date: 12/02/2021 CLINICAL DATA:  Altered mental status EXAM: CT HEAD WITHOUT CONTRAST TECHNIQUE: Contiguous axial images were obtained from the base of the skull through the vertex without intravenous contrast. RADIATION DOSE REDUCTION: This exam was performed according to the departmental dose-optimization program which includes automated exposure control, adjustment of the mA and/or kV according to patient size and/or use of iterative reconstruction technique. COMPARISON:  04/14/2012 FINDINGS: Brain: Bilateral subdural collections, measuring up to 16 mm on the left (series 4, image 27) and up to 6 mm on the right (series 4, image 40). These collections are mixed density and possibly loculated. 8 mm left to right midline shift. Effacement of the left-greater-than-right sulci. Narrowing of the left-greater-than-right lateral ventricle and third ventricle. No definite acute infarct, parenchymal hemorrhage, or hydrocephalus. No enlargement of the temporal horns to suggest entrapment. Vascular: No hyperdense vessel.  Skull: Normal. Negative for fracture or focal lesion. Sinuses/Orbits: No acute finding. Other: The mastoid air cells are well aerated. IMPRESSION: Bilateral mixed density subdural collections, most likely acute on chronic hemorrhage, measuring up to 16 mm on the left and 6 mm on the right, with 8 mm left to right midline shift and effacement of the left-greater-than-right sulci. These results were called by telephone at the time of interpretation on 12/02/2021 at 3:09 am to provider Katai Marsico [facility] , who verbally acknowledged these results. Electronically Signed   By: Merilyn Baba M.D.   On: 12/02/2021 03:09    Procedures Procedures  Cardiac monitor shows normal sinus rhythm, per my interpretation.  Medications Ordered in ED Medications  tranexamic acid (CYKLOKAPRON) 1000 MG/10ML topical solution 500 mg (500 mg Topical Given by Other 12/02/21 0310)  prothrombin complex conc human (KCENTRA) IVPB 1,568 Units (1,568 Units Intravenous New Bag/Given 12/02/21 0510)  phytonadione (VITAMIN K) 10 mg in dextrose 5 % 50 mL IVPB (10 mg Intravenous New Bag/Given 12/02/21 0446)    ED Course/ Medical Decision Making/ A&P                           Medical Decision Making Amount and/or Complexity of Data Reviewed Labs: ordered. Radiology: ordered.  Risk Decision regarding hospitalization.   Bleeding from site of dental extractions in patient who is anticoagulated.  I have ordered an INR to check to see if she is over anticoagulated.  Altered mental status which is likely a side effect of benzonatate.  I have ordered a CT of head to rule out intracranial bleeding.  I have also ordered hepatic function panel to look for evidence of any liver disease, and urinalysis to rule out occult UTI.  I have reviewed her old records, and on 11/19/2021 she had extraction of teeth #14, 15, 19, 30, 31.  On 11/16, INR was 1.6, and warfarin dose was adjusted.  I applied topical tranexamic acid to the bleeding sites.  Following  this, bleeding sites for tooth #19 was completely dry, but there continued to be oozing from the blue bleeding sites for teeth #30 and 31.  I have reviewed and interpreted her laboratory tests, and my interpretation is anemia with hemoglobin having dropped 1 g compared with 11/19/2021, mild elevation of random glucose.  INR is 2.4 which is slightly subtherapeutic for patient with a  prosthetic heart valve.  CT of the head shows bilateral subdural fluid collections which shows evidence of chronic subdural hematoma with evidence of acute bleed on the left.  There is 8 mm midline shift left to right.  I have independently viewed the images, and also independently discussed the findings with the radiologist.  I agree with the radiologist's interpretation.  Because of subdural hematoma with midline shift, I felt that patient needed emergent correction of INR in spite of the risk of clots forming on the prosthetic valve.  I have initiated the rapid reversal of anticoagulation protocol.  I discussed case with Dr. Reatha Armour, on-call for neurosurgery who requests patient be admitted to the hospitalist and transferred to [facility] where he can decide whether she needs surgical intervention.  I have discussed case with Dr. Bridgett Larsson of [facility], who agrees to admit the patient.  CRITICAL CARE Performed by: Delora Fuel Total critical care time: 115 minutes Critical care time was exclusive of separately billable procedures and treating other patients. Critical care was necessary to treat or prevent imminent or life-threatening deterioration. Critical care was time spent personally by me on the following activities: development of treatment plan with patient and/or surrogate as well as nursing, discussions with consultants, evaluation of patient's response to treatment, examination of patient, obtaining history from patient or surrogate, ordering and performing treatments and interventions, ordering and review  of laboratory studies, ordering and review of radiographic studies, pulse oximetry and re-evaluation of patient's condition.  Final Clinical Impression(s) / ED Diagnoses Final diagnoses:  Acute on chronic intracranial subdural hematoma (HCC)  Bleeding gums  Anticoagulated on warfarin    Rx / DC Orders ED Discharge Orders     None         Delora Fuel, MD 25/00/37 0488    Delora Fuel, MD 89/16/94 2239

## 2021-12-02 NOTE — Assessment & Plan Note (Signed)
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

## 2021-12-02 NOTE — H&P (Signed)
History and Physical    [name] VQQ:[id number] DOB: 1955/09/27 DOA: 12/01/2021  DOS: the patient was seen and examined on 12/01/2021  PCP: Lennie Odor, PA   Patient coming from: Home  I have personally briefly reviewed patient's old medical records in [place]  CC: bleeding gums HPI:  66 year old African-American female history of A-fib, history of mechanical mitral valve placement on chronic Coumadin, CKD stage IIIa, type 2 diabetes, chronic diastolic heart failure presents to the ER today with chief complaint of bleeding gums.  Patient had several teeth removed by oral surgery on 11/19/2021.  She states that her gums have been bleeding since her operation.  She has not yet called the oral surgeon that removed her teeth to let them know that she has had continued bleeding of her gums.  She presented to the ER today due to complaints of bleeding gums.  CT head was performed given the fact that she is on Coumadin for her mechanical mitral valve was found to have acute subdural hematoma with an 8 mm left to right midline shift.  Patient denies any recent head trauma or falls.  EDP discussed the case with neurosurgery who wanted the patient admitted to hospitalist service for her acute subdural hematoma with midline shift.  EDP is ready ordered Kcentra to reverse her anticoagulation.  Tried hospitalist contacted for admission.   ED Course: CT head shows acute subdural hematoma with 56m midline shift  Review of Systems:  Review of Systems  Constitutional: Negative.   HENT:         Bleeding gums from location where her teeth were removed on 11-19-2021.  Eyes: Negative.   Respiratory: Negative.    Cardiovascular: Negative.   Gastrointestinal: Negative.   Genitourinary: Negative.   Musculoskeletal: Negative.   Skin: Negative.   Neurological:  Negative for headaches.  Endo/Heme/Allergies:  Bruises/bleeds easily.  Psychiatric/Behavioral: Negative.    All other  systems reviewed and are negative.   Past Medical History:  Diagnosis Date   Abnormal chest CT    Anemia    Aortic atherosclerosis (HCC)    Asthma    Atrial enlargement, left    severe   Atrial fibrillation (HCC) 01/05/2011   Chronic persistent, failed DCCV    Atrial flutter (HCC)    Bronchospasm 1998   Cardiomegaly    Carotid bruit    Chronic diastolic congestive heart failure (HCC)    Diabetes mellitus    Type II   Ejection fraction < 50%    35-40%,    Heart murmur    History of blood transfusion    History of radiation therapy 03/18/19-04/15/19   endometrial - vaginal brachytherapy -  Dr. KSondra Come   Hypercholesterolemia    Hypertension    Junctional rhythm 09/14/2018   Noted on EKG   Left bundle branch block (LBBB) 09/14/2018   Noted on EKG   LVH (left ventricular hypertrophy) 10/10/2016   Moderate, Noted on ECHO   Mitral regurgitation    Obesity    Obesity (BMI 30-39.9) 01/16/2012   Persistent atrial fibrillation (HCC)    Polyp of rectum    Rheumatic fever 01/16/2012   Reported during childhood   S/P Maze operation for atrial fibrillation 02/15/2012   Complete biatrial lesion set using bipolar radiofrequency and cryothermy ablation with clipping of LA appendage   S/P mitral valve replacement with metallic valve 043/32/9518  341mSorin [employer] Optiform mechanical prosthesis   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   285m  Edwards mc3 ring annuloplasty   Shortness of breath    with exertion   Sleep apnea    DOES NOT HAVE CPAP   Stroke [facility]) 2014   Post op , left arm weakness   Tricuspid regurgitation 01/16/2012   Uterine cancer [facility])     Past Surgical History:  Procedure Laterality Date   CARDIAC CATHETERIZATION  >5 years   CARDIOVASCULAR STRESS TEST  09/2010   CARDIOVERSION  03/25/2011   Procedure: CARDIOVERSION;  Surgeon: Jettie Booze, MD;  Location: [place];  Service: Cardiovascular;  Laterality: N/A;   CARDIOVERSION N/A 07/27/2012    Procedure: CARDIOVERSION;  Surgeon: Jettie Booze, MD;  Location: [place];  Service: Cardiovascular;  Laterality: N/A;   CESAREAN SECTION     COLONOSCOPY WITH PROPOFOL N/A 04/27/2016   Procedure: COLONOSCOPY WITH PROPOFOL;  Surgeon: Wilford Corner, MD;  Location: [facility] ENDOSCOPY;  Service: Endoscopy;  Laterality: N/A;   INTRAOPERATIVE TRANSESOPHAGEAL ECHOCARDIOGRAM  02/15/2012   Procedure: INTRAOPERATIVE TRANSESOPHAGEAL ECHOCARDIOGRAM;  Surgeon: Rexene Alberts, MD;  Location: [place];  Service: Open Heart Surgery;  Laterality: N/A;   MAZE  02/15/2012   Procedure: MAZE;  Surgeon: Rexene Alberts, MD;  Location: [place];  Service: Open Heart Surgery;  Laterality: N/A;   MITRAL VALVE REPLACEMENT  02/15/2012   Procedure: MITRAL VALVE (MV) REPLACEMENT;  Surgeon: Rexene Alberts, MD;  Location: [place];  Service: Open Heart Surgery;  Laterality: N/A;   ROBOTIC ASSISTED LAPAROSCOPIC HYSTERECTOMY AND SALPINGECTOMY Bilateral 01/02/2019   Procedure: XI ROBOTIC ASSISTED LAPAROSCOPIC TOTAL HYSTERECTOMY WITH BILATERAL SALPINGOOPHORECTOMY, SENTINEL LYMPH NODE BIOPSY;  Surgeon: [name], MD;  Location: [facility];  Service: Gynecology;  Laterality: Bilateral;   TEE WITHOUT CARDIOVERSION  12/21/2011   Procedure: TRANSESOPHAGEAL ECHOCARDIOGRAM (TEE);  Surgeon: Jettie Booze, MD;  Location: [place];  Service: Cardiovascular;  Laterality: N/A;   TOOTH EXTRACTION N/A 11/19/2021   Procedure: DENTAL EXTRACTION TEETH NUMBER 14, 19, 30, 31;  Surgeon: Diona Browner, DMD;  Location: [place];  Service: Oral Surgery;  Laterality: N/A;   TRICUSPID VALVE REPLACEMENT  02/15/2012   Procedure: TRICUSPID VALVE REPAIR;  Surgeon: Rexene Alberts, MD;  Location: [place];  Service: Open Heart Surgery;  Laterality: N/A;   TUBAL LIGATION     at time of her c-section     reports that she has never smoked. She has never used smokeless tobacco. She reports that she does not drink alcohol and does not use drugs.  Allergies   Allergen Reactions   Chlorhexidine Other (See Comments)    Unknown    Family History  Problem Relation Age of Onset   Asthma Mother    Diabetes Mother    Hypertension Mother    Hypertension Brother    Heart attack Neg Hx    Stroke Neg Hx    Colon cancer Neg Hx    Colon polyps Neg Hx    Liver disease Neg Hx    Uterine cancer Neg Hx    Ovarian cancer Neg Hx    Breast cancer Neg Hx     Prior to Admission medications   Medication Sig Start Date End Date Taking? Authorizing Provider  acetaminophen (TYLENOL) 500 MG tablet Take 1,000-1,500 mg by mouth every 8 (eight) hours as needed for pain. 01/05/19   [provider]  albuterol (PROVENTIL HFA) 108 (90 Base) MCG/ACT inhaler Inhale 2 puffs into the lungs as needed for shortness of breath.    [provider]  alendronate (FOSAMAX)  70 MG tablet Take 70 mg by mouth once a week. Take with a full glass of water on an empty stomach.    [provider]  amiodarone (PACERONE) 200 MG tablet TAKE 1 TABLET(200 MG) BY MOUTH DAILY 11/24/20   Jettie Booze, MD  amLODipine (NORVASC) 10 MG tablet TAKE 1 TABLET EVERY DAY 07/15/21   Jettie Booze, MD  amoxicillin (AMOXIL) 500 MG capsule Take 1 capsule (500 mg total) by mouth 2 (two) times daily. 11/19/21   Diona Browner, DMD  atorvastatin (LIPITOR) 40 MG tablet TAKE 1 TABLET(40 MG) BY MOUTH DAILY 11/24/20   Jettie Booze, MD  calcium carbonate (OS-CAL - DOSED IN MG OF ELEMENTAL CALCIUM) 1250 MG tablet Take 1 tablet by mouth daily.    [provider]  carvedilol (COREG) 12.5 MG tablet TAKE 1 TABLET TWICE DAILY WITH MEALS 09/27/21   Jettie Booze, MD  Cholecalciferol (VITAMIN D) 1000 UNITS capsule Take 1,000 Units by mouth daily.    [provider]  cyclobenzaprine (FLEXERIL) 10 MG tablet Take 10 mg by mouth daily.  07/24/13   [provider]  enoxaparin (LOVENOX) 100 MG/ML injection Inject 1 mL (100 mg total) into the skin every  12 (twelve) hours. 11/21/21   Diona Browner, DMD  furosemide (LASIX) 40 MG tablet Take 40 mg by mouth daily as needed for fluid or edema.    [provider]  HYDROcodone-acetaminophen (NORCO) 5-325 MG tablet Take 1 tablet by mouth every 4 (four) hours as needed for moderate pain. 11/19/21   Diona Browner, DMD  insulin glargine (LANTUS) 100 UNIT/ML injection Inject 30-35 Units into the skin See admin instructions. Inject 30 units into the skin in the morning and 35 units at bedtime.    [provider]  irbesartan (AVAPRO) 300 MG tablet TAKE 1 TABLET EVERY DAY 07/15/21   Jettie Booze, MD  metFORMIN (GLUCOPHAGE) 500 MG tablet Take 500 mg by mouth 2 (two) times daily with a meal.  11/26/12   [provider]  methocarbamol (ROBAXIN) 500 MG tablet Take 500 mg by mouth every 8 (eight) hours as needed for muscle spasms.     [provider]  senna (SENOKOT) 8.6 MG TABS tablet Take 1 tablet (8.6 mg total) by mouth daily as needed for up to 15 doses for mild constipation. 01/05/19   [name], MD  warfarin (COUMADIN) 4 MG tablet TAKE 1 AND 1/2 TABLETS TO 2 TABLETS BY MOUTH DAILY AS DIRECTED BY COUMADIN CLINIC Patient taking differently: Take 6 mg by mouth at bedtime.  BY MOUTH DAILY AS DIRECTED BY COUMADIN CLINIC 09/09/19   Jettie Booze, MD    Physical Exam: Vitals:   12/01/21 2358  BP: 122/74  Pulse: 97  Resp: 15  Temp: 97.7 F (36.5 C)  TempSrc: Oral  SpO2: 95%    Physical Exam Vitals and nursing note reviewed.  Constitutional:      General: She is not in acute distress.    Appearance: She is obese. She is not ill-appearing, toxic-appearing or diaphoretic.  HENT:     Head: Normocephalic and atraumatic.  Cardiovascular:     Rate and Rhythm: Normal rate and regular rhythm.     Pulses: Normal pulses.     Comments: Mechanical click ausculated Pulmonary:     Effort: Pulmonary effort is normal. No respiratory distress.     Breath  sounds: No wheezing or rales.  Abdominal:     General: Bowel sounds are normal.  There is no distension.     Tenderness: There is no abdominal tenderness. There is no guarding or rebound.  Musculoskeletal:     Cervical back: No rigidity.     Right lower leg: No edema.     Left lower leg: No edema.  Skin:    General: Skin is warm and dry.     Capillary Refill: Capillary refill takes less than 2 seconds.  Neurological:     General: No focal deficit present.     Mental Status: She is alert and oriented to person, place, and time.      Labs on Admission: I have personally reviewed following labs and imaging studies  CBC: Recent Labs  Lab 12/02/21 0148  WBC 10.3  NEUTROABS 7.4  HGB 10.0*  HCT 32.4*  MCV 101.3*  PLT 951   Basic Metabolic Panel: Recent Labs  Lab 12/02/21 0148  NA 139  K 4.1  CL 105  CO2 24  GLUCOSE 108*  BUN 18  CREATININE 0.96  CALCIUM 9.1   GFR: Estimated Creatinine Clearance: 71 mL/min (by C-G formula based on SCr of 0.96 mg/dL). Liver Function Tests: Recent Labs  Lab 12/02/21 0148  AST 35  ALT 24  ALKPHOS 71  BILITOT 0.9  PROT 8.2*  ALBUMIN 3.9   No results for input(s): "LIPASE", "AMYLASE" in the last 168 hours. No results for input(s): "AMMONIA" in the last 168 hours. Coagulation Profile: Recent Labs  Lab 11/25/21 0856 12/02/21 0148  INR 1.6* 2.4*   Cardiac Enzymes: No results for input(s): "CKTOTAL", "CKMB", "CKMBINDEX", "TROPONINI", "TROPONINIHS" in the last 168 hours. BNP (last 3 results) No results for input(s): "PROBNP" in the last 8760 hours. HbA1C: No results for input(s): "HGBA1C" in the last 72 hours. CBG: No results for input(s): "GLUCAP" in the last 168 hours. Lipid Profile: No results for input(s): "CHOL", "HDL", "LDLCALC", "TRIG", "CHOLHDL", "LDLDIRECT" in the last 72 hours. Thyroid Function Tests: No results for input(s): "TSH", "T4TOTAL", "FREET4", "T3FREE", "THYROIDAB" in the last 72 hours. Anemia Panel: No  results for input(s): "VITAMINB12", "FOLATE", "FERRITIN", "TIBC", "IRON", "RETICCTPCT" in the last 72 hours. Urine analysis:    Component Value Date/Time   COLORURINE YELLOW 12/02/2021 0156   APPEARANCEUR CLEAR 12/02/2021 0156   LABSPEC >1.030 (H) 12/02/2021 0156   PHURINE 5.5 12/02/2021 0156   GLUCOSEU NEGATIVE 12/02/2021 0156   HGBUR LARGE (A) 12/02/2021 0156   BILIRUBINUR SMALL (A) 12/02/2021 0156   KETONESUR 15 (A) 12/02/2021 0156   PROTEINUR 100 (A) 12/02/2021 0156   UROBILINOGEN 4.0 (H) 02/13/2012 1504   NITRITE NEGATIVE 12/02/2021 0156   LEUKOCYTESUR TRACE (A) 12/02/2021 0156    Radiological Exams on Admission: I have personally reviewed images CT Head Wo Contrast  Result Date: 12/02/2021 CLINICAL DATA:  Altered mental status EXAM: CT HEAD WITHOUT CONTRAST TECHNIQUE: Contiguous axial images were obtained from the base of the skull through the vertex without intravenous contrast. RADIATION DOSE REDUCTION: This exam was performed according to the departmental dose-optimization program which includes automated exposure control, adjustment of the mA and/or kV according to patient size and/or use of iterative reconstruction technique. COMPARISON:  04/14/2012 FINDINGS: Brain: Bilateral subdural collections, measuring up to 16 mm on the left (series 4, image 27) and up to 6 mm on the right (series 4, image 40). These collections are mixed density and possibly loculated. 8 mm left to right midline shift. Effacement of the left-greater-than-right sulci. Narrowing of the left-greater-than-right lateral ventricle and third ventricle. No definite acute infarct, parenchymal  hemorrhage, or hydrocephalus. No enlargement of the temporal horns to suggest entrapment. Vascular: No hyperdense vessel. Skull: Normal. Negative for fracture or focal lesion. Sinuses/Orbits: No acute finding. Other: The mastoid air cells are well aerated. IMPRESSION: Bilateral mixed density subdural collections, most likely acute  on chronic hemorrhage, measuring up to 16 mm on the left and 6 mm on the right, with 8 mm left to right midline shift and effacement of the left-greater-than-right sulci. These results were called by telephone at the time of interpretation on 12/02/2021 at 3:09 am to provider DAVID [facility] , who verbally acknowledged these results. Electronically Signed   By: Merilyn Baba M.D.   On: 12/02/2021 03:09    EKG: My personal interpretation of EKG shows: no EKG to review    Assessment/Plan Principal Problem:   Acute subdural hematoma (HCC) Active Problems:   Hypertension   Type 2 diabetes mellitus (HCC)   Atrial fibrillation (HCC)   Chronic diastolic CHF (congestive heart failure) (HCC)   S/P mitral valve replacement with metallic valve   Stage 3a chronic kidney disease (CKD) (HCC)   Chronic anticoagulation - on coumadin for mechanical mitral valve, afib    Assessment and Plan: * Acute subdural hematoma ([place]) Admit to observation progressive telemetry bed. Presumed to be spontaneous bleeding due to lack of recent head trauma.  EDP has ordered Kcentra to reverse pt's INR. Given her mechanical mitral valve, will need cardiology consult in the AM to determine timeframe that pt can safely be off systemic anticoagulation. EDP has consulted neurosurgery who wants pt to be transferred to [facility] under the hospitalist service for her acute subdural hematoma with midline shift.  Chronic anticoagulation - on coumadin for mechanical mitral valve, afib Will need to reverse her INR given acute subdural hematoma with midline shift.  Stage 3a chronic kidney disease (CKD) (HCC) Stable.  S/P mitral valve replacement with metallic valve On coumadin as outpatient for mechanical mitral valve.  Chronic diastolic CHF (congestive heart failure) (HCC) Stable. Euvolemic.  Atrial fibrillation (HCC) Stable.  Type 2 diabetes mellitus (HCC) Continue Lantus.  Add sliding scale insulin.  Hypertension Stable.  Continue  Norvasc 10 mg, Avapro 300 mg, Coreg 12.5 mg twice daily   DVT prophylaxis: SCDs Code Status: Full Code Family Communication: discussed with pt and dtr Tome[place] at bedside  Disposition Plan: return home  Consults called: EDP has discussed case with neurosurgery([place])  Admission status: Observation,  progressive   Kristopher Oppenheim, DO [facility] 12/02/2021, 4:12 AM

## 2021-12-02 NOTE — Assessment & Plan Note (Signed)
Continue Lantus.  Add sliding scale insulin.

## 2021-12-02 NOTE — Assessment & Plan Note (Signed)
Will need to reverse her INR given acute subdural hematoma with midline shift.

## 2021-12-02 NOTE — Assessment & Plan Note (Signed)
Stable. Euvolemic. ?

## 2021-12-02 NOTE — Consult Note (Addendum)
   Providing Compassionate, Quality Care - Together  Neurosurgery Consult  Referring physician: Dr. Florene Glen Reason for referral: [facility]  Chief Complaint: Bleeding from gums, difficulty with speech  History of Present Illness: This is a 66 year old, right-handed female, with a history of A-fib, CKD, diabetes, mechanical mitral valve replacement in 2014, on Coumadin, with complaints of worsening bleeding from her gums after a dental extraction on the 10th.  While in the hospital she was complaining of some mild headache and began having some speech difficulty.  CT of the brain was obtained which revealed bilateral subdural hematomas with mixed density, left significantly greater than right with significant midline shift.  At this time she continues to have intermittent speech difficulty.  Denies any focal weakness numbness or tingling.  She denies any headache.  Denies any vision changes.  She is accompanied by her daughter and granddaughter at bedside.  They all deny any recent trauma.  No seizure-like activity.  Her Coumadin was reversed at [facility].  She does have PT/INR that is slightly elevated, pending new results tomorrow morning.   Medications: I have reviewed the patient's current medications. Allergies: No Known Allergies  History reviewed. No pertinent family history. Social History:  has no history on file for tobacco use, alcohol use, and drug use.  ROS: All pertinent positives and negatives listed HPI above  Physical Exam:  Vital signs in last 24 hours: Temp:  [98 F (36.7 C)-98.3 F (36.8 C)] 98 F (36.7 C) (07/25 1814) Pulse Rate:  [58-128] 65 (07/26 0746) Resp:  [11-18] 14 (07/26 0217) BP: (138-182)/(65-125) 153/88 (07/26 0700) SpO2:  [91 %-98 %] 96 % (07/26 0746) PE: Awake alert oriented x3 PERRLA Speech is fluent, she does have word finding difficulty and expressive aphasia intermittently No drift Bilateral upper and lower extremities are full strength  throughout Sensory intact to light touch Face is symmetric   Impression/Assessment:  66 year old female with  Bilateral, left greater than right mixed density subdural hematomas with midline shift Mechanical mitral valve, chronically on Coumadin  Plan:  -I extensively discussed with the patient and her family at bedside about the findings of the CT scan and given her symptomatology and midline shift, I recommended surgical intervention in the form of a left frontotemporal craniotomy for evacuation of the hematoma.  I explained to them that this is a very challenging situation given her need for chronic anticoagulation, she does have a history of stroke when she was off of her anticoagulation.  I explained that she has high risk for complications such as stroke while being off anticoagulation, as well as recurrent hemorrhage as we will have to place her on a heparin drip postoperatively and monitor her closely in the neuro ICU.  I extensively went over the risks benefits and expected outcomes.  I answered all of their questions. -Tentatively they would like to proceed with surgical scheduling tomorrow however the daughter is interested in seeking a second opinion.  I offered her that we are happy to transfer the patient if she has someone else that she is interested in obtaining her care. -Keppra 500 mg twice daily   Thank you for allowing me to participate in this patient's care.  Please do not hesitate to call with questions or concerns.   Elwin Sleight, [facility] Cell: [phone]

## 2021-12-02 NOTE — Consult Note (Signed)
Cardiology Consult Note   Patient ID: [name] MRN: [id number]; DOB: 09-04-1955   Admission date: 12/01/2021  PCP:  [name], [place] [facility] Cardiologist:  Natalie Grooms, MD       Chief Complaint:  [facility] options  Patient Profile:   [name] is a 66 y.o. female with Mechanical MV and TV repair (2014) with post op AFL and prior embolic stroke. who is being seen 12/02/2021 for the evaluation of SDE.  History of Present Illness:   [name] had been doing well.    She used to work at the [employer] and had worsening symptoms. Has Mitral stenosis. S/p Mechanical valve. In the past she was less adherent there coumadin. Has embolic stroke. Since has been much more adherent.  She is a patient of [name]. Found to have bleeding gums after oral surgery 11/19/21; She was found to has acute subdural hematoma with midline shift.   She is s/p KCENTRA.  Patient notes that she is feeling ok.    Flat affect. Has had no chest pain, chest pressure, chest tightness, chest stinging. No shortness of breath, DOE .  No PND or orthopnea.  No weight gain, leg swelling , or abdominal swelling.  No syncope or near syncope . Notes  no palpitations or funny heart beats.     When I walked in to start the exam, paramedic in the room- notes that on her assessment she was not able to remember her name.  AOX 3 on my exam.  Reason for eval, per patient, is bleeding gums.  Past Medical History:  Diagnosis Date   Abnormal chest CT    Anemia    Aortic atherosclerosis (HCC)    Asthma    Atrial enlargement, left    severe   Atrial fibrillation (HCC) 01/05/2011   Chronic persistent, failed DCCV    Atrial flutter (HCC)    Bronchospasm 1998   Cardiomegaly    Carotid bruit    Chronic diastolic congestive heart failure (HCC)    Diabetes mellitus    Type II   Ejection fraction < 50%    35-40%,    Heart murmur    History of blood transfusion    History  of radiation therapy 03/18/19-04/15/19   endometrial - vaginal brachytherapy -  [name]    Hypercholesterolemia    Hypertension    Junctional rhythm 09/14/2018   Noted on EKG   Left bundle branch block (LBBB) 09/14/2018   Noted on EKG   LVH (left ventricular hypertrophy) 10/10/2016   Moderate, Noted on ECHO   Mitral regurgitation    Obesity    Obesity (BMI 30-39.9) 01/16/2012   Persistent atrial fibrillation ([place])    Polyp of rectum    Rheumatic fever 01/16/2012   Reported during childhood   S/P Maze operation for atrial fibrillation 02/15/2012   Complete biatrial lesion set using bipolar radiofrequency and cryothermy ablation with clipping of LA appendage   S/P mitral valve replacement with metallic valve 85/63/1497   59m [name] [employer] Optiform mechanical prosthesis   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   260mEdwards mc3 ring annuloplasty   Shortness of breath    with exertion   Sleep apnea    DOES NOT HAVE CPAP   [place]   Post op , left arm weakness   Tricuspid regurgitation 01/16/2012   [facility]    Past Surgical History:  Procedure Laterality Date  CARDIAC CATHETERIZATION  >5 years   CARDIOVASCULAR STRESS TEST  09/2010   CARDIOVERSION  03/25/2011   Procedure: CARDIOVERSION;  Surgeon: Natalie Booze, MD;  Location: [place];  Service: Cardiovascular;  Laterality: N/A;   CARDIOVERSION N/A 07/27/2012   Procedure: CARDIOVERSION;  Surgeon: Natalie Booze, MD;  Location: [place];  Service: Cardiovascular;  Laterality: N/A;   CESAREAN SECTION     COLONOSCOPY WITH PROPOFOL N/A 04/27/2016   Procedure: COLONOSCOPY WITH PROPOFOL;  Surgeon: Natalie Corner, MD;  [facility];  Service: Endoscopy;  Laterality: N/A;   INTRAOPERATIVE TRANSESOPHAGEAL ECHOCARDIOGRAM  02/15/2012   Procedure: INTRAOPERATIVE TRANSESOPHAGEAL ECHOCARDIOGRAM;  Surgeon: Natalie Alberts, MD;  Location: [place];  Service: Open Heart Surgery;  Laterality:  N/A;   MAZE  02/15/2012   Procedure: MAZE;  Surgeon: Natalie Alberts, MD;  Location: [place];  Service: Open Heart Surgery;  Laterality: N/A;   MITRAL VALVE REPLACEMENT  02/15/2012   Procedure: MITRAL VALVE (MV) REPLACEMENT;  Surgeon: Natalie Alberts, MD;  Location: [place];  Service: Open Heart Surgery;  Laterality: N/A;   ROBOTIC ASSISTED LAPAROSCOPIC HYSTERECTOMY AND SALPINGECTOMY Bilateral 01/02/2019   Procedure: XI ROBOTIC ASSISTED LAPAROSCOPIC TOTAL HYSTERECTOMY WITH BILATERAL SALPINGOOPHORECTOMY, SENTINEL LYMPH NODE BIOPSY;  Surgeon: Natalie Mosses, MD;  Location: [facility];  Service: Gynecology;  Laterality: Bilateral;   TEE WITHOUT CARDIOVERSION  12/21/2011   Procedure: TRANSESOPHAGEAL ECHOCARDIOGRAM (TEE);  Surgeon: Natalie Booze, MD;  Location: [place];  Service: Cardiovascular;  Laterality: N/A;   TOOTH EXTRACTION N/A 11/19/2021   Procedure: DENTAL EXTRACTION TEETH NUMBER 14, 19, 30, 31;  Surgeon: [name], DMD;  Location: [place];  Service: Oral Surgery;  Laterality: N/A;   TRICUSPID VALVE REPLACEMENT  02/15/2012   Procedure: TRICUSPID VALVE REPAIR;  Surgeon: Natalie Alberts, MD;  Location: [place];  Service: Open Heart Surgery;  Laterality: N/A;   TUBAL LIGATION     at time of her c-section     Medications Prior to Admission: Prior to Admission medications   Medication Sig Start Date End Date Taking? Authorizing Provider  atorvastatin (LIPITOR) 40 MG tablet TAKE 1 TABLET(40 MG) BY MOUTH DAILY 11/24/20  Yes Natalie Booze, MD  acetaminophen (TYLENOL) 500 MG tablet Take 1,000-1,500 mg by mouth every 8 (eight) hours as needed for pain. 01/05/19   [provider]  albuterol (PROVENTIL HFA) 108 (90 Base) MCG/ACT inhaler Inhale 2 puffs into the lungs as needed for shortness of breath.    [provider]  alendronate (FOSAMAX) 70 MG tablet Take 70 mg by mouth once a week. Take with a full glass of water on an empty stomach.    [provider]   amiodarone (PACERONE) 200 MG tablet TAKE 1 TABLET(200 MG) BY MOUTH DAILY 11/24/20   Natalie Booze, MD  amLODipine (NORVASC) 10 MG tablet TAKE 1 TABLET EVERY DAY 07/15/21   Natalie Booze, MD  amoxicillin (AMOXIL) 500 MG capsule Take 1 capsule (500 mg total) by mouth 2 (two) times daily. 11/19/21   [name], DMD  benzonatate (TESSALON) 200 MG capsule SMARTSIG:1 Capsule(s) By Mouth 1-3 Times Daily 11/25/21   [provider]  calcium carbonate (OS-CAL - DOSED IN MG OF ELEMENTAL CALCIUM) 1250 MG tablet Take 1 tablet by mouth daily.    [provider]  carvedilol (COREG) 12.5 MG tablet TAKE 1 TABLET TWICE DAILY WITH MEALS 09/27/21   Natalie Booze, MD  Cholecalciferol (VITAMIN D) 1000 UNITS capsule Take 1,000 Units by  mouth daily.    [provider]  cyclobenzaprine (FLEXERIL) 10 MG tablet Take 10 mg by mouth daily.  07/24/13   [provider]  enoxaparin (LOVENOX) 100 MG/ML injection Inject 1 mL (100 mg total) into the skin every 12 (twelve) hours. 11/21/21   [name], DMD  furosemide (LASIX) 40 MG tablet Take 40 mg by mouth daily as needed for fluid or edema.    [provider]  HYDROcodone-acetaminophen (NORCO) 5-325 MG tablet Take 1 tablet by mouth every 4 (four) hours as needed for moderate pain. 11/19/21   [name], DMD  insulin glargine (LANTUS) 100 UNIT/ML injection Inject 30-35 Units into the skin See admin instructions. Inject 30 units into the skin in the morning and 35 units at bedtime.    [provider]  irbesartan (AVAPRO) 300 MG tablet TAKE 1 TABLET EVERY DAY 07/15/21   Natalie Booze, MD  metFORMIN (GLUCOPHAGE) 500 MG tablet Take 500 mg by mouth 2 (two) times daily with a meal.  11/26/12   [provider]  methocarbamol (ROBAXIN) 500 MG tablet Take 500 mg by mouth every 8 (eight) hours as needed for muscle spasms.     [provider]  senna (SENOKOT) 8.6 MG TABS tablet Take 1 tablet  (8.6 mg total) by mouth daily as needed for up to 15 doses for mild constipation. 01/05/19   Natalie Mosses, MD  warfarin (COUMADIN) 4 MG tablet TAKE 1 AND 1/2 TABLETS TO 2 TABLETS BY MOUTH DAILY AS DIRECTED BY COUMADIN CLINIC Patient taking differently: Take 6 mg by mouth at bedtime.  BY MOUTH DAILY AS DIRECTED BY COUMADIN CLINIC 09/09/19   Natalie Booze, MD     Allergies:    Allergies  Allergen Reactions   Chlorhexidine Other (See Comments)    Unknown    Social History:   Social History   Socioeconomic History   Marital status: Married    Spouse name: Not on file   Number of children: Not on file   Years of education: Not on file   Highest education level: Not on file  Occupational History   Occupation: Conservation officer, nature    Employer: [employer]  Tobacco Use   Smoking status: Never   Smokeless tobacco: Never  Vaping Use   Vaping Use: Never used  Substance and Sexual Activity   Alcohol use: No   Drug use: No   Sexual activity: Yes    Birth control/protection: Post-menopausal  Other Topics Concern   Not on file  Social History Narrative   Pt lives in [place] with spouse.  Works at [employer]. 2 grown children, 1 grandchild   Social Determinants of Radio broadcast assistant Strain: Not on [employer] Insecurity: Not on file  Transportation Needs: Not on file  Physical Activity: Not on file  Stress: Not on file  Social Connections: Not on file  Intimate Partner Violence: Not on file    Family History:   The patient's family history includes Asthma in her mother; Diabetes in her mother; Hypertension in her brother and mother. There is no history of Heart attack, Stroke, Colon cancer, Colon polyps, Liver disease, Uterine cancer, Ovarian cancer, or Breast cancer.    ROS:  Please see the history of present illness.   Physical Exam/Data:   Vitals:   12/02/21 0815 12/02/21 0830 12/02/21 0841 12/02/21 0933  BP: (!) 192/95 (!)  199/107  (!) 167/82  Pulse: 92 94  93  Resp:      Temp:   97.6 F (36.4 C)   TempSrc:   Oral   SpO2: 98% 94%     No intake or output data in the 24 hours ending 12/02/21 1056    11/19/2021    6:35 AM 11/18/2021    9:56 AM 10/21/2021    8:38 AM  Last 3 Weights  Weight (lbs) 240 lb 240 lb 240 lb 8 oz  Weight (kg) 108.863 kg 108.863 kg 109.09 kg     There is no height or weight on file to calculate BMI.  General:  Appears older that stated age 18: normal Neck: no JVD Vascular: No carotid bruits; Distal pulses 2+ bilaterally   Cardiac:  normal S1, S2; RRR; mechanical heart sounds Lungs:  clear to auscultation bilaterally, no wheezing, rhonchi or rales  Abd: soft, nontender, no hepatomegaly  Ext: no edema Skin: warm and dry  Neuro:  Aox3 with no gross MSK weakness. Psych:  Flat affect   EKG is ordered Telemetry is ordered- SR with rare PVCs so far  Laboratory Data:  High Sensitivity Troponin:  No results for input(s): "TROPONINIHS" in the last 720 hours.    Chemistry Recent Labs  Lab 12/02/21 0148 12/02/21 0945  NA 139 139  K 4.1 3.7  CL 105 106  CO2 24 26  GLUCOSE 108* 96  BUN 18 15  CREATININE 0.96 0.78  CALCIUM 9.1 9.0  MG  --  1.9  GFRNONAA >60 >60  ANIONGAP 10 7    Recent Labs  Lab 12/02/21 0148 12/02/21 0945  PROT 8.2* 8.2*  ALBUMIN 3.9 3.8  AST 35 32  ALT 24 25  ALKPHOS 71 69  BILITOT 0.9 1.2   Lipids No results for input(s): "CHOL", "TRIG", "HDL", "LABVLDL", "LDLCALC", "CHOLHDL" in the last 168 hours. Hematology Recent Labs  Lab 12/02/21 0148 12/02/21 0945  WBC 10.3 10.0  RBC 3.20* 3.18*  HGB 10.0* 9.9*  HCT 32.4* 31.7*  MCV 101.3* 99.7  MCH 31.3 31.1  MCHC 30.9 31.2  RDW 13.5 13.4  PLT 203 194   Thyroid No results for input(s): "TSH", "FREET4" in the last 168 hours. BNPNo results for input(s): "BNP", "PROBNP" in the last 168 hours.  DDimer No results for input(s): "DDIMER" in the last 168 hours.   Radiology/Studies:  CT Head  Wo Contrast  Result Date: 12/02/2021 CLINICAL DATA:  Altered mental status EXAM: CT HEAD WITHOUT CONTRAST TECHNIQUE: Contiguous axial images were obtained from the base of the skull through the vertex without intravenous contrast. RADIATION DOSE REDUCTION: This exam was performed according to the departmental dose-optimization program which includes automated exposure control, adjustment of the mA and/or kV according to patient size and/or use of iterative reconstruction technique. COMPARISON:  04/14/2012 FINDINGS: Brain: Bilateral subdural collections, measuring up to 16 mm on the left (series 4, image 27) and up to 6 mm on the right (series 4, image 40). These collections are mixed density and possibly loculated. 8 mm left to right midline shift. Effacement of the left-greater-than-right sulci. Narrowing of the left-greater-than-right lateral ventricle and third ventricle. No definite acute infarct, parenchymal hemorrhage, or hydrocephalus. No enlargement of the temporal horns to suggest entrapment. Vascular: No hyperdense vessel. Skull: Normal. Negative for fracture or focal lesion. Sinuses/Orbits: No acute finding. Other: The mastoid air cells are well aerated. IMPRESSION: Bilateral mixed density subdural collections, most likely acute on chronic hemorrhage, measuring up to 16 mm on the left and 6 mm on the right, with  8 mm left to right midline shift and effacement of the left-greater-than-right sulci. These results were called by telephone at the time of interpretation on 12/02/2021 at 3:09 am to provider DAVID [facility] , who verbally acknowledged these results. Electronically Signed   By: Merilyn Baba M.D.   On: 12/02/2021 03:09     Assessment and Plan:   MS and MR s/p MVR (mechanical) with valvular atrial fibrillation (CHADVASC NA) Prior stroke Complicated by SDH - patient would meet anticoagulation indications but her SDH and complicated matters - BP mgmt as per NSG goals with SDH - when clear  for [facility], would do trial of heparin; INR goal PTA was 3.0 +/- 0.5 - continue statin unless NSG concerns - continue coreg and amiodarone - I have ordered an EKG and telemetry  LBBB - Monitor  Reached out to primary about changes in her mentation.  Expediting transfer may be reasonable  For questions or updates, please contact [place] Please consult [email or web address] for contact info under     Signed, Werner Lean, MD  12/02/2021 10:56 AM

## 2021-12-02 NOTE — Progress Notes (Signed)
Patient arrived from [facility] via [facility]; oriented to room and unit routine; family arrived separately.

## 2021-12-02 NOTE — Assessment & Plan Note (Signed)
Stable

## 2021-12-02 NOTE — Assessment & Plan Note (Addendum)
Admit to observation progressive telemetry bed. Presumed to be spontaneous bleeding due to lack of recent head trauma.  EDP has ordered Kcentra to reverse pt's INR. Given her mechanical mitral valve, will need cardiology consult in the AM to determine timeframe that pt can safely be off systemic anticoagulation. EDP has consulted neurosurgery who wants pt to be transferred to [facility] under the hospitalist service for her acute subdural hematoma with midline shift.

## 2021-12-02 NOTE — Assessment & Plan Note (Signed)
Stable.  Continue Norvasc 10 mg, Avapro 300 mg, Coreg 12.5 mg twice daily

## 2021-12-02 NOTE — ED Notes (Signed)
Called [facility] for  transport to [facility]

## 2021-12-02 NOTE — Plan of Care (Signed)
Patient seen and rounded on this morning in the ER. Admitted after MN, see H&P for full A&P.  [name] is a 66 yo female with pertinent PMH of mechanical MV and afib on chronic Coumadin, also has chronic dCHF.  She recently had  4 teeth extracted on 11/15/21. She was on Lovenox prior to extraction and continued Lovenox bridge after resuming Coumadin after procedure.  She was to have an upcoming INR check on 12/06/21.   She presented due to ongoing bleeding from her mouth s/p extractions. INR on admission was 2.4. Further workup with [facility] showed an acute on chronic SDH measuring 16 mm on the left and 6 mm on the right with L to R shift and effacement of the L>R sulci.  Anticoagulation was held on admission and she received Vit K, TXA, and Kcentra.  Neurosurgery recommended transfer to [facility] for ongoing management.  Cardiology consulted as well given anticoagulation on hold in setting of mechanical MV.   On exam, she has mild right sided weakness but no other focal deficits appreciated on neuro exam. She has dried gauze in her mouth from holding pressure over prior bleeding areas. Speech seemed somewhat dysarthric but likely because of having gauze in her mouth. No appreciated aphasia.   Plan: - cardiology is consulted and aware of patient - continue tx to [facility]; confirmed with transfer center - inform neurosurgery of patient's arrival to [facility] - continue neuro checks q4h - repeat CTH and labs per neurosurgery rec's once evaluated  Dwyane Dee, MD [facility] 12/02/2021, 9:55 AM

## 2021-12-03 ENCOUNTER — Other Ambulatory Visit: Payer: Self-pay

## 2021-12-03 ENCOUNTER — Inpatient Hospital Stay (HOSPITAL_COMMUNITY): Payer: Medicare HMO | Admitting: Anesthesiology

## 2021-12-03 ENCOUNTER — Encounter (HOSPITAL_COMMUNITY)
Admission: EM | Disposition: A | Discharge status: Rehabilitation Facility | Payer: Self-pay | Source: Home / Self Care | Attending: Pulmonary Disease

## 2021-12-03 DIAGNOSIS — I4891 Unspecified atrial fibrillation: Secondary | ICD-10-CM | POA: Diagnosis not present

## 2021-12-03 DIAGNOSIS — I10 Essential (primary) hypertension: Secondary | ICD-10-CM | POA: Diagnosis not present

## 2021-12-03 DIAGNOSIS — I4819 Other persistent atrial fibrillation: Secondary | ICD-10-CM | POA: Diagnosis not present

## 2021-12-03 DIAGNOSIS — G4730 Sleep apnea, unspecified: Secondary | ICD-10-CM

## 2021-12-03 DIAGNOSIS — S065XAA Traumatic subdural hemorrhage with loss of consciousness status unknown, initial encounter: Secondary | ICD-10-CM | POA: Diagnosis not present

## 2021-12-03 DIAGNOSIS — S065X0A Traumatic subdural hemorrhage without loss of consciousness, initial encounter: Secondary | ICD-10-CM | POA: Diagnosis not present

## 2021-12-03 HISTORY — PX: CRANIOTOMY: SHX93

## 2021-12-03 LAB — PREPARE RBC (CROSSMATCH)

## 2021-12-03 LAB — GLUCOSE, CAPILLARY
Glucose-Capillary: 140 mg/dL — ABNORMAL HIGH (ref 70–99)
Glucose-Capillary: 154 mg/dL — ABNORMAL HIGH (ref 70–99)
Glucose-Capillary: 145 mg/dL — ABNORMAL HIGH (ref 70–99)
Glucose-Capillary: 230 mg/dL — ABNORMAL HIGH (ref 70–99)
Glucose-Capillary: 256 mg/dL — ABNORMAL HIGH (ref 70–99)

## 2021-12-03 LAB — CBC WITH DIFFERENTIAL/PLATELET
Abs Immature Granulocytes: 0.03 10*3/uL (ref 0.00–0.07)
Basophils Absolute: 0 10*3/uL (ref 0.0–0.1)
Basophils Relative: 0 %
Eosinophils Absolute: 0.1 10*3/uL (ref 0.0–0.5)
Eosinophils Relative: 1 %
HCT: 28.8 % — ABNORMAL LOW (ref 36.0–46.0)
Hemoglobin: 9.3 g/dL — ABNORMAL LOW (ref 12.0–15.0)
Immature Granulocytes: 0 %
Lymphocytes Relative: 20 %
Lymphs Abs: 1.7 10*3/uL (ref 0.7–4.0)
MCH: 32.3 pg (ref 26.0–34.0)
MCHC: 32.3 g/dL (ref 30.0–36.0)
MCV: 100 fL (ref 80.0–100.0)
Monocytes Absolute: 0.7 10*3/uL (ref 0.1–1.0)
Monocytes Relative: 9 %
Neutro Abs: 5.9 10*3/uL (ref 1.7–7.7)
Neutrophils Relative %: 70 %
Platelets: 180 10*3/uL (ref 150–400)
RBC: 2.88 MIL/uL — ABNORMAL LOW (ref 3.87–5.11)
RDW: 13.4 % (ref 11.5–15.5)
WBC: 8.4 10*3/uL (ref 4.0–10.5)
nRBC: 0 % (ref 0.0–0.2)

## 2021-12-03 LAB — PROTIME-INR
INR: 1.1 (ref 0.8–1.2)
Prothrombin Time: 13.8 seconds (ref 11.4–15.2)

## 2021-12-03 LAB — HEMOGLOBIN A1C
Hgb A1c MFr Bld: 5.8 % — ABNORMAL HIGH (ref 4.8–5.6)
Mean Plasma Glucose: 120 mg/dL

## 2021-12-03 LAB — BASIC METABOLIC PANEL
Anion gap: 12 (ref 5–15)
BUN: 17 mg/dL (ref 8–23)
CO2: 25 mmol/L (ref 22–32)
Calcium: 9.2 mg/dL (ref 8.9–10.3)
Chloride: 104 mmol/L (ref 98–111)
Creatinine, Ser: 1.07 mg/dL — ABNORMAL HIGH (ref 0.44–1.00)
GFR, Estimated: 57 mL/min — ABNORMAL LOW (ref >60)
Glucose, Bld: 131 mg/dL — ABNORMAL HIGH (ref 70–99)
Potassium: 4 mmol/L (ref 3.5–5.1)
Sodium: 141 mmol/L (ref 135–145)

## 2021-12-03 LAB — MAGNESIUM: Magnesium: 1.9 mg/dL (ref 1.7–2.4)

## 2021-12-03 SURGERY — CRANIOTOMY HEMATOMA EVACUATION SUBDURAL
Anesthesia: General | Laterality: Left

## 2021-12-03 MED ORDER — THROMBIN 20000 UNITS EX SOLR
CUTANEOUS | Status: AC
  Filled 2021-12-03: qty 20000

## 2021-12-03 MED ORDER — CHLORHEXIDINE GLUCONATE 0.12 % MT SOLN
OROMUCOSAL | Status: AC
  Filled 2021-12-03: qty 15

## 2021-12-03 MED ORDER — HYDROCODONE-ACETAMINOPHEN 5-325 MG PO TABS: 1.0000 | ORAL_TABLET | ORAL | Status: DC | PRN

## 2021-12-03 MED ORDER — ALBUTEROL SULFATE HFA 108 (90 BASE) MCG/ACT IN AERS
INHALATION_SPRAY | RESPIRATORY_TRACT | Status: DC | PRN
  Administered 2021-12-03: 2 via RESPIRATORY_TRACT

## 2021-12-03 MED ORDER — LACTATED RINGERS IV SOLN: INTRAVENOUS | Status: AC

## 2021-12-03 MED ORDER — THROMBIN 5000 UNITS EX SOLR
CUTANEOUS | Status: AC
  Filled 2021-12-03: qty 5000

## 2021-12-03 MED ORDER — DOCUSATE SODIUM 100 MG PO CAPS
100.0000 mg | ORAL_CAPSULE | Freq: Two times a day (BID) | ORAL | Status: DC
  Administered 2021-12-03 – 2021-12-10 (×14): 100 mg via ORAL
  Filled 2021-12-03 (×15): qty 1

## 2021-12-03 MED ORDER — LABETALOL HCL 5 MG/ML IV SOLN: 10.0000 mg | INTRAVENOUS | Status: DC | PRN

## 2021-12-03 MED ORDER — PHENYLEPHRINE HCL-NACL 20-0.9 MG/250ML-% IV SOLN
INTRAVENOUS | Status: DC | PRN
  Administered 2021-12-03: 50 ug/min via INTRAVENOUS

## 2021-12-03 MED ORDER — CEFAZOLIN SODIUM-DEXTROSE 1-4 GM/50ML-% IV SOLN
1.0000 g | Freq: Three times a day (TID) | INTRAVENOUS | Status: AC
  Administered 2021-12-03 (×2): 1 g via INTRAVENOUS
  Filled 2021-12-03 (×3): qty 50

## 2021-12-03 MED ORDER — PHENYLEPHRINE 80 MCG/ML (10ML) SYRINGE FOR IV PUSH (FOR BLOOD PRESSURE SUPPORT)
PREFILLED_SYRINGE | INTRAVENOUS | Status: DC | PRN
  Administered 2021-12-03: 80 ug via INTRAVENOUS
  Administered 2021-12-03: 160 ug via INTRAVENOUS

## 2021-12-03 MED ORDER — PROMETHAZINE HCL 12.5 MG PO TABS: 12.5000 mg | ORAL_TABLET | ORAL | Status: DC | PRN

## 2021-12-03 MED ORDER — SODIUM CHLORIDE 0.9 % IV SOLN: INTRAVENOUS | Status: DC

## 2021-12-03 MED ORDER — 0.9 % SODIUM CHLORIDE (POUR BTL) OPTIME
TOPICAL | Status: DC | PRN
  Administered 2021-12-03: 1000 mL

## 2021-12-03 MED ORDER — BUPIVACAINE-EPINEPHRINE (PF) 0.5% -1:200000 IJ SOLN
INTRAMUSCULAR | Status: DC | PRN
  Administered 2021-12-03: 5 mL

## 2021-12-03 MED ORDER — ROCURONIUM BROMIDE 10 MG/ML (PF) SYRINGE
PREFILLED_SYRINGE | INTRAVENOUS | Status: DC | PRN
  Administered 2021-12-03: 40 mg via INTRAVENOUS
  Administered 2021-12-03: 60 mg via INTRAVENOUS

## 2021-12-03 MED ORDER — ONDANSETRON HCL 4 MG/2ML IJ SOLN
INTRAMUSCULAR | Status: AC
  Filled 2021-12-03: qty 2

## 2021-12-03 MED ORDER — HEMOSTATIC AGENTS (NO CHARGE) OPTIME
TOPICAL | Status: DC | PRN
  Administered 2021-12-03: 1 via TOPICAL

## 2021-12-03 MED ORDER — PROPOFOL 10 MG/ML IV BOLUS
INTRAVENOUS | Status: AC
  Filled 2021-12-03: qty 20

## 2021-12-03 MED ORDER — ONDANSETRON HCL 4 MG/2ML IJ SOLN
INTRAMUSCULAR | Status: DC | PRN
  Administered 2021-12-03: 4 mg via INTRAVENOUS

## 2021-12-03 MED ORDER — ACETAMINOPHEN 500 MG PO TABS: 1000.0000 mg | ORAL_TABLET | Freq: Once | ORAL | Status: AC

## 2021-12-03 MED ORDER — ALBUTEROL SULFATE HFA 108 (90 BASE) MCG/ACT IN AERS
INHALATION_SPRAY | RESPIRATORY_TRACT | Status: AC
  Filled 2021-12-03: qty 6.7

## 2021-12-03 MED ORDER — ORAL CARE MOUTH RINSE: 15.0000 mL | Freq: Once | OROMUCOSAL | Status: DC

## 2021-12-03 MED ORDER — SODIUM CHLORIDE 0.9 % IV SOLN
10.0000 mL/h | Freq: Once | INTRAVENOUS | Status: AC
  Administered 2021-12-03: 10 mL/h via INTRAVENOUS

## 2021-12-03 MED ORDER — THROMBIN 5000 UNITS EX SOLR
OROMUCOSAL | Status: DC | PRN
  Administered 2021-12-03: 5 mL via TOPICAL

## 2021-12-03 MED ORDER — SUGAMMADEX SODIUM 200 MG/2ML IV SOLN
INTRAVENOUS | Status: DC | PRN
  Administered 2021-12-03: 400 mg via INTRAVENOUS

## 2021-12-03 MED ORDER — PHENYLEPHRINE 80 MCG/ML (10ML) SYRINGE FOR IV PUSH (FOR BLOOD PRESSURE SUPPORT)
PREFILLED_SYRINGE | INTRAVENOUS | Status: AC
  Filled 2021-12-03: qty 10

## 2021-12-03 MED ORDER — FENTANYL CITRATE (PF) 250 MCG/5ML IJ SOLN
INTRAMUSCULAR | Status: DC | PRN
  Administered 2021-12-03 (×3): 50 ug via INTRAVENOUS

## 2021-12-03 MED ORDER — PANTOPRAZOLE SODIUM 40 MG IV SOLR
40.0000 mg | Freq: Every day | INTRAVENOUS | Status: DC
  Administered 2021-12-03: 40 mg via INTRAVENOUS
  Filled 2021-12-03 (×2): qty 10

## 2021-12-03 MED ORDER — ROCURONIUM BROMIDE 10 MG/ML (PF) SYRINGE
PREFILLED_SYRINGE | INTRAVENOUS | Status: AC
  Filled 2021-12-03: qty 10

## 2021-12-03 MED ORDER — THROMBIN 20000 UNITS EX SOLR
CUTANEOUS | Status: DC | PRN
  Administered 2021-12-03: 20 mL via TOPICAL

## 2021-12-03 MED ORDER — BUPIVACAINE-EPINEPHRINE (PF) 0.5% -1:200000 IJ SOLN
INTRAMUSCULAR | Status: AC
  Filled 2021-12-03: qty 30

## 2021-12-03 MED ORDER — BACITRACIN ZINC 500 UNIT/GM EX OINT
TOPICAL_OINTMENT | CUTANEOUS | Status: AC
  Filled 2021-12-03: qty 28.35

## 2021-12-03 MED ORDER — DEXAMETHASONE SODIUM PHOSPHATE 10 MG/ML IJ SOLN
INTRAMUSCULAR | Status: DC | PRN
  Administered 2021-12-03: 10 mg via INTRAVENOUS

## 2021-12-03 MED ORDER — DEXAMETHASONE SODIUM PHOSPHATE 10 MG/ML IJ SOLN
INTRAMUSCULAR | Status: AC
  Filled 2021-12-03: qty 1

## 2021-12-03 MED ORDER — HYDRALAZINE HCL 20 MG/ML IJ SOLN: 10.0000 mg | INTRAMUSCULAR | Status: DC | PRN

## 2021-12-03 MED ORDER — CHLORHEXIDINE GLUCONATE 0.12 % MT SOLN: 15.0000 mL | Freq: Once | OROMUCOSAL | Status: DC

## 2021-12-03 MED ORDER — FENTANYL CITRATE (PF) 100 MCG/2ML IJ SOLN: 25.0000 ug | INTRAMUSCULAR | Status: DC | PRN

## 2021-12-03 MED ORDER — OXYCODONE HCL 5 MG/5ML PO SOLN: 5.0000 mg | Freq: Once | ORAL | Status: DC | PRN

## 2021-12-03 MED ORDER — OXYCODONE HCL 5 MG PO TABS: 5.0000 mg | ORAL_TABLET | Freq: Once | ORAL | Status: DC | PRN

## 2021-12-03 MED ORDER — LIDOCAINE 2% (20 MG/ML) 5 ML SYRINGE
INTRAMUSCULAR | Status: DC | PRN
  Administered 2021-12-03: 60 mg via INTRAVENOUS

## 2021-12-03 MED ORDER — BACITRACIN ZINC 500 UNIT/GM EX OINT
TOPICAL_OINTMENT | CUTANEOUS | Status: DC | PRN
  Administered 2021-12-03: 1 via TOPICAL

## 2021-12-03 MED ORDER — PROPOFOL 10 MG/ML IV BOLUS
INTRAVENOUS | Status: DC | PRN
  Administered 2021-12-03: 140 mg via INTRAVENOUS
  Administered 2021-12-03: 30 mg via INTRAVENOUS

## 2021-12-03 MED ORDER — LIDOCAINE-EPINEPHRINE 1 %-1:100000 IJ SOLN
INTRAMUSCULAR | Status: DC | PRN
  Administered 2021-12-03: 5 mL

## 2021-12-03 MED ORDER — FENTANYL CITRATE (PF) 250 MCG/5ML IJ SOLN
INTRAMUSCULAR | Status: AC
  Filled 2021-12-03: qty 5

## 2021-12-03 MED ORDER — ACETAMINOPHEN 500 MG PO TABS
ORAL_TABLET | ORAL | Status: AC
  Administered 2021-12-03: 1000 mg via ORAL
  Filled 2021-12-03: qty 2

## 2021-12-03 MED ORDER — ONDANSETRON HCL 4 MG/2ML IJ SOLN: 4.0000 mg | Freq: Once | INTRAMUSCULAR | Status: DC | PRN

## 2021-12-03 MED ORDER — LIDOCAINE 2% (20 MG/ML) 5 ML SYRINGE
INTRAMUSCULAR | Status: AC
  Filled 2021-12-03: qty 5

## 2021-12-03 MED ORDER — SODIUM CHLORIDE 0.9 % IV SOLN: INTRAVENOUS | Status: DC | PRN

## 2021-12-03 MED ORDER — MORPHINE SULFATE (PF) 2 MG/ML IV SOLN: 1.0000 mg | INTRAVENOUS | Status: DC | PRN

## 2021-12-03 MED ORDER — LACTATED RINGERS IV SOLN: INTRAVENOUS | Status: DC | PRN

## 2021-12-03 MED ORDER — LEVETIRACETAM IN NACL 500 MG/100ML IV SOLN
500.0000 mg | Freq: Two times a day (BID) | INTRAVENOUS | Status: DC
  Administered 2021-12-03 – 2021-12-04 (×2): 500 mg via INTRAVENOUS
  Filled 2021-12-03 (×2): qty 100

## 2021-12-03 MED ORDER — LIDOCAINE-EPINEPHRINE 1 %-1:100000 IJ SOLN
INTRAMUSCULAR | Status: AC
  Filled 2021-12-03: qty 1

## 2021-12-03 SURGICAL SUPPLY — 80 items
BAG COUNTER SPONGE SURGICOUNT (BAG) ×2 IMPLANT
BAG DRN CSF CATH SYS STRL MNTR (MISCELLANEOUS) ×1
BAG SPNG CNTER NS LX DISP (BAG) ×1
BIT DRILL WIRE PASS 1.3MM (BIT) IMPLANT
BLADE CLIPPER SURG (BLADE) ×2 IMPLANT
BUR CARBIDE MATCH 3.0 (BURR) ×2 IMPLANT
BUR SPIRAL ROUTER 2.3 (BUR) ×2 IMPLANT
CANISTER SUCT 3000ML PPV (MISCELLANEOUS) ×2 IMPLANT
CATH VENT SHUNT STD STR (CATHETERS) IMPLANT
CATH VENTRIC 35X38 W/TROCAR LG (CATHETERS) IMPLANT
DRAIN JACKSON RD 7FR 3/32 (WOUND CARE) IMPLANT
DRAIN JP 10F RND RADIO (DRAIN) IMPLANT
DRAPE NEUROLOGICAL W/INCISE (DRAPES) ×2 IMPLANT
DRAPE SHEET LG 3/4 BI-LAMINATE (DRAPES) ×2 IMPLANT
DRAPE SURG 17X23 STRL (DRAPES) IMPLANT
DRAPE WARM FLUID 44X44 (DRAPES) ×2 IMPLANT
DRILL WIRE PASS 1.3MM (BIT) ×1
DRSG AQUACEL AG ADV 3.5X 6 (GAUZE/BANDAGES/DRESSINGS) IMPLANT
DURAPREP 6ML APPLICATOR 50/CS (WOUND CARE) ×2 IMPLANT
ELECT COATED BLADE 2.86 ST (ELECTRODE) ×2 IMPLANT
ELECT REM PT RETURN 9FT ADLT (ELECTROSURGICAL) ×1
ELECTRODE REM PT RTRN 9FT ADLT (ELECTROSURGICAL) ×2 IMPLANT
EVACUATOR SILICONE 100CC (DRAIN) IMPLANT
FORCEPS BIPO MALIS IRRIG 9X1.5 (NEUROSURGERY SUPPLIES) ×2 IMPLANT
GAUZE 4X4 16PLY ~~LOC~~+RFID DBL (SPONGE) IMPLANT
GAUZE SPONGE 4X4 12PLY STRL (GAUZE/BANDAGES/DRESSINGS) IMPLANT
GLOVE BIOGEL M 7.0 STRL (GLOVE) IMPLANT
GLOVE BIOGEL M 8.0 STRL (GLOVE) IMPLANT
GLOVE BIOGEL PI IND STRL 8 (GLOVE) ×4 IMPLANT
GLOVE ECLIPSE 8.0 STRL XLNG CF (GLOVE) ×4 IMPLANT
GLOVE EXAM NITRILE LRG STRL (GLOVE) IMPLANT
GLOVE EXAM NITRILE XL STR (GLOVE) IMPLANT
GLOVE EXAM NITRILE XS STR PU (GLOVE) IMPLANT
GLOVE SURG ENC MOIS LTX SZ8 (GLOVE) ×2 IMPLANT
GLOVE SURG UNDER POLY LF SZ8.5 (GLOVE) ×2 IMPLANT
GOWN STRL REUS W/ TWL LRG LVL3 (GOWN DISPOSABLE) IMPLANT
GOWN STRL REUS W/ TWL XL LVL3 (GOWN DISPOSABLE) ×4 IMPLANT
GOWN STRL REUS W/TWL 2XL LVL3 (GOWN DISPOSABLE) IMPLANT
GOWN STRL REUS W/TWL LRG LVL3 (GOWN DISPOSABLE)
GOWN STRL REUS W/TWL XL LVL3 (GOWN DISPOSABLE) ×3
GRAFT DURAGEN MATRIX 3WX3L (Graft) ×1 IMPLANT
GRAFT DURAGEN MATRIX 3X3 SNGL (Graft) IMPLANT
HEMOSTAT POWDER KIT SURGIFOAM (HEMOSTASIS) ×2 IMPLANT
HEMOSTAT SURGICEL 2X14 (HEMOSTASIS) ×2 IMPLANT
IV NS 1000ML (IV SOLUTION) ×1
IV NS 1000ML BAXH (IV SOLUTION) ×2 IMPLANT
KIT BASIN OR (CUSTOM PROCEDURE TRAY) ×2 IMPLANT
KIT TURNOVER KIT B (KITS) ×2 IMPLANT
NEEDLE HYPO 22GX1.5 SAFETY (NEEDLE) ×2 IMPLANT
NS IRRIG 1000ML POUR BTL (IV SOLUTION) ×2 IMPLANT
PACK CRANIOTOMY CUSTOM (CUSTOM PROCEDURE TRAY) ×2 IMPLANT
PATTIES SURGICAL .5 X.5 (GAUZE/BANDAGES/DRESSINGS) IMPLANT
PATTIES SURGICAL .5 X3 (DISPOSABLE) IMPLANT
PATTIES SURGICAL 1X1 (DISPOSABLE) IMPLANT
PERFORATOR LRG  14-11MM (BIT) ×2
PERFORATOR LRG 14-11MM (BIT) ×2 IMPLANT
PLATE CRANIAL SHUNT 14 (Plate) IMPLANT
RETRACTOR LONE STAR DISPOSABLE (INSTRUMENTS) ×4 IMPLANT
SCREW UNIII AXS SD 1.5X4 (Screw) IMPLANT
SET TUBING IRRIGATION DISP (TUBING) ×2 IMPLANT
SPONGE NEURO XRAY DETECT 1X3 (DISPOSABLE) IMPLANT
SPONGE SURGIFOAM ABS GEL 100 (HEMOSTASIS) ×2 IMPLANT
STAPLER VISISTAT 35W (STAPLE) ×2 IMPLANT
STOCKINETTE 6  STRL (DRAPES) ×1
STOCKINETTE 6 STRL (DRAPES) ×2 IMPLANT
STRIP CLOSURE SKIN 1/2X4 (GAUZE/BANDAGES/DRESSINGS) ×2 IMPLANT
SUT ETHILON 3 0 FSL (SUTURE) IMPLANT
SUT ETHILON 3 0 PS 1 (SUTURE) IMPLANT
SUT NURALON 4 0 TR CR/8 (SUTURE) ×6 IMPLANT
SUT VIC AB 0 CT1 18XCR BRD8 (SUTURE) ×2 IMPLANT
SUT VIC AB 0 CT1 8-18 (SUTURE) ×1
SUT VIC AB 2-0 CP2 18 (SUTURE) ×2 IMPLANT
SUT VICRYL RAPIDE 4/0 PS 2 (SUTURE) ×2 IMPLANT
SYSTEM CSF EXTERNAL DRAINAGE (MISCELLANEOUS) IMPLANT
TOWEL GREEN STERILE (TOWEL DISPOSABLE) ×2 IMPLANT
TOWEL GREEN STERILE FF (TOWEL DISPOSABLE) ×2 IMPLANT
TRAY FOLEY MTR SLVR 16FR STAT (SET/KITS/TRAYS/PACK) ×2 IMPLANT
TUBE CONNECTING 12X1/4 (SUCTIONS) ×2 IMPLANT
UNDERPAD 30X36 HEAVY ABSORB (UNDERPADS AND DIAPERS) ×2 IMPLANT
WATER STERILE IRR 1000ML POUR (IV SOLUTION) ×2 IMPLANT

## 2021-12-03 NOTE — Consult Note (Signed)
NAME:  [name], MRN:  [id number], DOB:  08-Jun-1955, LOS: 1 ADMISSION DATE:  12/01/2021, CONSULTATION DATE:  12/03/21 REFERRING MD:  Dawley CHIEF COMPLAINT:  AMS   History of Present Illness:  [name] is a 66 y.o. female who has a PMH of A.fib and mechanical MVR in 2014 on chronic Coumadin, CKD, DM. She presented to ED 11/23 with bleeding gums after dental extraction 11/10. As part of her workup, she had CT head which demonstrated acute on chronic bilateral DH with 22m L to R MLS. [facility] given Kcentra [facility].  She was taken to OR 11/24 for left frontotemporal craniotomy for evacuation of SDH, lysis, of membranes.  Post operatively, she returned to the ICU and PCCM asked to assist with her care while in ICU.    Pertinent  Medical History:  has Hypertension; Type 2 diabetes mellitus ([place]; Hypercholesterolemia; Mediastinal lymphadenopathy; Asthma; Atrial fibrillation ([place]; Chronic systolic dysfunction of left ventricle; Snoring; Chronic diastolic CHF (congestive heart failure) ([place]; Mitral regurgitation; Tricuspid regurgitation; Rheumatic fever; Obesity (BMI 30-39.9); CHF (congestive heart failure) ([place]; MR (mitral regurgitation); S/P mitral valve replacement with metallic valve; S/P tricuspid valve repair; S/P Maze operation for atrial fibrillation; S/P TVR (tricuspid valve repair); S/P MVR (mitral valve replacement); CVA (cerebral infarction); Atrial flutter (HL[place]; Mitral valve disorders(424.0); Heart valve replaced by other means; Encounter for therapeutic drug monitoring; LBBB (left bundle branch block); Special screening for malignant neoplasms, colon; Uterine cancer ([place]; Endometrial cancer ([place]; Iron deficiency anemia; Stage 3a chronic kidney disease (CKD) ([place]; Drug-induced hyperglycemia; Anemia, chronic disease; Pancytopenia, acquired (HC[place]; Peripheral neuropathy due to chemotherapy ([place]; Acute subdural  hematoma ([facility]; Chronic anticoagulation - on coumadin for mechanical mitral valve, afib; and Subdural hematoma (HCC) on their problem list.  Significant Hospital Events: Including procedures, antibiotic start and stop dates in addition to other pertinent events   11/23 admit 11/24 to OR for evacuation of SDH  Interim History / Subjective:  Groggy post op. Drain in place. Slightly hypertensive.  Objective:  Blood pressure 136/63, pulse 80, temperature 97.8 F (36.6 C), resp. rate 20, height '5\' 6"'$  (1.676 m), weight 106.2 kg, SpO2 99 %.        Intake/Output Summary (Last 24 hours) at 12/03/2021 1138 Last data filed at 12/03/2021 1036 Gross per 24 hour  Intake 1742.17 ml  Output 550 ml  Net 1192.17 ml   Filed Weights   12/02/21 1941  Weight: 106.2 kg    Examination: General: Adult female, in PACU, in NAD. Neuro: Somnolent but opens eyes to voice. Moves extremities but does not answer questions appropriatley. HEENT: L scalp dressings C/D/I, drain in place. Cardiovascular: IRIR, no M/R/G.  Lungs: Respirations even and unlabored.  CTA bilaterally, No W/R/R. Abdomen: BS x 4, soft, NT/ND.  Musculoskeletal: No gross deformities, no edema.  Skin: Intact, warm, no rashes.  Labs/imaging personally reviewed:  CT head 11/23 > bilateral SDH. 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 - Post op care per NSGY. - Repeat CT head 11/25 and if stable, discuss with NSGY to confirm ok with Heparin gtt. - Continue Keppra.  Hx. A.fib, Mitral stenosis s/p MVR 2014 on chronic coumadin, dCHF (Echo from EF 60-65%), HTN, HLD. - Cardiology following, recommending Heparin when OK with neurosurgery (appears to be Sunday 11/26 if repeat CT scan stable). - Hold home Coumadin. - Continue  home Amiodarone. - PRN Hydralazine. - Hold home Amlodipine, Atorvastatin, Irbesartan, Coreg and consider restart AM 11/25 if hemodynamics stable and able to take  PO.  AKI. - Supportive care. - Follow BMP.  Hx DM. - SSI if glucose consistently > 180.  Hx CVA. - Supportive care.  Best practice (evaluated daily):  Diet/type: NPO - advance as tolerated DVT prophylaxis: SCD GI prophylaxis: N/A Lines: N/A Foley:  N/A Code Status:  full code Last date of multidisciplinary goals of care discussion: None yet.  Labs   CBC: Recent Labs  Lab 12/02/21 0148 12/02/21 0945 12/03/21 0238  WBC 10.3 10.0 8.4  NEUTROABS 7.4 7.4 5.9  HGB 10.0* 9.9* 9.3*  HCT 32.4* 31.7* 28.8*  MCV 101.3* 99.7 100.0  PLT 203 194 710    Basic Metabolic Panel: Recent Labs  Lab 12/02/21 0148 12/02/21 0945 12/03/21 0238  NA 139 139 141  K 4.1 3.7 4.0  CL 105 106 104  CO2 '24 26 25  '$ GLUCOSE 108* 96 131*  BUN '18 15 17  '$ CREATININE 0.96 0.78 1.07*  CALCIUM 9.1 9.0 9.2  MG  --  1.9 1.9   GFR: Estimated Creatinine Clearance: 62.9 mL/min (A) (by C-G formula based on SCr of 1.07 mg/dL (H)). Recent Labs  Lab 12/02/21 0148 12/02/21 0945 12/03/21 0238  WBC 10.3 10.0 8.4    Liver Function Tests: Recent Labs  Lab 12/02/21 0148 12/02/21 0945  AST 35 32  ALT 24 25  ALKPHOS 71 69  BILITOT 0.9 1.2  PROT 8.2* 8.2*  ALBUMIN 3.9 3.8   No results for input(s): "LIPASE", "AMYLASE" in the last 168 hours. No results for input(s): "AMMONIA" in the last 168 hours.  ABG    Component Value Date/Time   PHART 7.338 (L) 02/16/2012 0432   PCO2ART 43.6 02/16/2012 0432   PO2ART 67.0 (L) 02/16/2012 0432   HCO3 23.0 02/16/2012 0432   TCO2 25 04/14/2012 1240   ACIDBASEDEF 2.0 02/16/2012 0432   O2SAT 90.0 02/16/2012 0432     Coagulation Profile: Recent Labs  Lab 12/02/21 0148 12/02/21 0650 12/03/21 0238  INR 2.4* 1.4* 1.1    Cardiac Enzymes: No results for input(s): "CKTOTAL", "CKMB", "CKMBINDEX", "TROPONINI" in the last 168 hours.  HbA1C: Hgb A1c MFr Bld  Date/Time Value Ref Range Status  12/02/2021 09:45 AM 5.8 (H) 4.8 - 5.6 % Final    Comment:     (NOTE)         Prediabetes: 5.7 - 6.4         Diabetes: >6.4         Glycemic control for adults with diabetes: <7.0   11/30/2018 10:48 AM 6.1 (H) 4.8 - 5.6 % Final    Comment:    (NOTE) Pre diabetes:          5.7%-6.4% Diabetes:              >6.4% Glycemic control for   <7.0% adults with diabetes     CBG: Recent Labs  Lab 12/02/21 1210 12/02/21 1639 12/02/21 2145 12/03/21 0604 12/03/21 1049  GLUCAP 102* 101* 160* 140* 154*    Review of Systems:   Unable to obtain as pt is encephalopathic.  Past Medical History:  She,  has a past medical history of Abnormal chest CT, Anemia, Aortic atherosclerosis ([place]), Asthma, Atrial enlargement, left, Atrial fibrillation ([place]) (01/05/2011), Atrial flutter ([place]), Bronchospasm (1998), Cardiomegaly, Carotid bruit, Chronic diastolic congestive heart failure ([place]), Diabetes mellitus, Ejection fraction < 50%, Heart murmur, History of  blood transfusion, History of radiation therapy (03/18/19-04/15/19), Hypercholesterolemia, Hypertension, Junctional rhythm (09/14/2018), Left bundle branch block (LBBB) (09/14/2018), LVH (left ventricular hypertrophy) (10/10/2016), Mitral regurgitation, Obesity, Obesity (BMI 30-39.9) (01/16/2012), Persistent atrial fibrillation ([place]), Polyp of rectum, Rheumatic fever (01/16/2012), S/P Maze operation for atrial fibrillation (02/15/2012), S/P mitral valve replacement with metallic valve (54/65/0354), S/P tricuspid valve repair (02/15/2012), Shortness of breath, Sleep apnea, Stroke ([place]) (2014), Tricuspid regurgitation (01/16/2012), and Uterine cancer ([place]).   Surgical History:   Past Surgical History:  Procedure Laterality Date   CARDIAC CATHETERIZATION  >5 years   CARDIOVASCULAR STRESS TEST  09/2010   CARDIOVERSION  03/25/2011   Procedure: CARDIOVERSION;  Surgeon: Jettie Booze, MD;  Location: [place];  Service: Cardiovascular;  Laterality: N/A;   CARDIOVERSION N/A 07/27/2012   Procedure: CARDIOVERSION;  Surgeon:  Jettie Booze, MD;  Location: [place];  Service: Cardiovascular;  Laterality: N/A;   CESAREAN SECTION     COLONOSCOPY WITH PROPOFOL N/A 04/27/2016   Procedure: COLONOSCOPY WITH PROPOFOL;  Surgeon: Wilford Corner, MD;  [facility] END[facility]COPY;  Service: Endoscopy;  Laterality: N/A;   INTRAOPERATIVE TRANSESOPHAGEAL ECHOCARDIOGRAM  02/15/2012   Procedure: INTRAOPERATIVE TRANSESOPHAGEAL ECHOCARDIOGRAM;  Surgeon: Rexene Alberts, MD;  Location: [place];  Service: Open Heart Surgery;  Laterality: N/A;   MAZE  02/15/2012   Procedure: MAZE;  Surgeon: Rexene Alberts, MD;  Location: [place];  Service: Open Heart Surgery;  Laterality: N/A;   MITRAL VALVE REPLACEMENT  02/15/2012   Procedure: MITRAL VALVE (MV) REPLACEMENT;  Surgeon: Rexene Alberts, MD;  Location: [place];  Service: Open Heart Surgery;  Laterality: N/A;   ROBOTIC ASSISTED LAPAROSCOPIC HYSTERECTOMY AND SALPINGECTOMY Bilateral 01/02/2019   Procedure: XI ROBOTIC ASSISTED LAPAROSCOPIC TOTAL HYSTERECTOMY WITH BILATERAL SALPINGOOPHORECTOMY, SENTINEL LYMPH NODE BIOPSY;  Surgeon: [name], MD;  Location: [facility];  Service: Gynecology;  Laterality: Bilateral;   TEE WITHOUT CARDIOVERSION  12/21/2011   Procedure: TRANSESOPHAGEAL ECHOCARDIOGRAM (TEE);  Surgeon: Jettie Booze, MD;  Location: [place];  Service: Cardiovascular;  Laterality: N/A;   TOOTH EXTRACTION N/A 11/19/2021   Procedure: DENTAL EXTRACTION TEETH NUMBER 14, 19, 30, 31;  Surgeon: Diona Browner, DMD;  Location: [place];  Service: Oral Surgery;  Laterality: N/A;   TRICUSPID VALVE REPLACEMENT  02/15/2012   Procedure: TRICUSPID VALVE REPAIR;  Surgeon: Rexene Alberts, MD;  Location: [place];  Service: Open Heart Surgery;  Laterality: N/A;   TUBAL LIGATION     at time of her c-section     Social History:   reports that she has never smoked. She has never used smokeless tobacco. She reports that she does not drink alcohol and does not use drugs.   Family History:  Her  family history includes Asthma in her mother; Diabetes in her mother; Hypertension in her brother and mother. There is no history of Heart attack, Stroke, Colon cancer, Colon polyps, Liver disease, Uterine cancer, Ovarian cancer, or Breast cancer.   Allergies Allergies  Allergen Reactions   Chlorhexidine Other (See Comments)    Unknown     Home Medications  Prior to Admission medications   Medication Sig Start Date End Date Taking? Authorizing Provider  alendronate (FOSAMAX) 70 MG tablet Take 70 mg by mouth once a week. Take with a full glass of water on an empty stomach.   Yes [provider]  amLODipine (NORVASC) 10 MG tablet TAKE 1 TABLET EVERY DAY Patient taking differently: Take 10 mg by mouth daily. 07/15/21  Yes Jettie Booze, MD  amoxicillin (AMOXIL) 500 MG capsule Take 1 capsule (500 mg total) by mouth 2 (two) times daily. 11/19/21  Yes Diona Browner, DMD  atorvastatin (LIPITOR) 40 MG tablet TAKE 1 TABLET(40 MG) BY MOUTH DAILY Patient taking differently: Take 40 mg by mouth daily. 11/24/20  Yes Jettie Booze, MD  benzonatate (TESSALON) 200 MG capsule Take 200 mg by mouth 3 (three) times daily as needed for cough. 11/25/21  Yes [provider]  carvedilol (COREG) 12.5 MG tablet TAKE 1 TABLET TWICE DAILY WITH MEALS 09/27/21  Yes Jettie Booze, MD  enoxaparin (LOVENOX) 100 MG/ML injection Inject 1 mL (100 mg total) into the skin every 12 (twelve) hours. 11/21/21  Yes Diona Browner, DMD  HYDROcodone-acetaminophen (NORCO) 5-325 MG tablet Take 1 tablet by mouth every 4 (four) hours as needed for moderate pain. 11/19/21  Yes Diona Browner, DMD  irbesartan (AVAPRO) 300 MG tablet TAKE 1 TABLET EVERY DAY 07/15/21  Yes Jettie Booze, MD  metFORMIN (GLUCOPHAGE) 500 MG tablet Take 500 mg by mouth 2 (two) times daily with a meal.  11/26/12  Yes [provider]  warfarin (COUMADIN) 4 MG tablet TAKE 1 AND 1/2 TABLETS TO 2 TABLETS BY MOUTH DAILY AS  DIRECTED BY COUMADIN CLINIC Patient taking differently: Take 6 mg by mouth at bedtime. 09/09/19  Yes Jettie Booze, MD  amiodarone (PACERONE) 200 MG tablet TAKE 1 TABLET(200 MG) BY MOUTH DAILY Patient not taking: Reported on 12/02/2021 11/24/20   Jettie Booze, MD     Critical care time: 35 min.    Montey Hora, [facility] For pager details, please see AMION or use Epic chat  After 1900, please call [facility] for cross coverage needs 12/03/2021, 11:38 AM

## 2021-12-03 NOTE — Progress Notes (Signed)
I discussed the plan of care with the critical care team.  Tentatively plan is for repeat CT tomorrow.  We may begin a low-dose heparin drip on Sunday without bolus pending her progression.

## 2021-12-03 NOTE — Transfer of Care (Signed)
Immediate Anesthesia Transfer of Care Note  Patient: [name]  Procedure(s) Performed: FRONTOTEMPORAL CRANIOTOMY FOR EVACUATION SUBDURAL HEMATOMA (Left)  Patient Location: PACU  Anesthesia Type:General  Level of Consciousness: drowsy  Airway & Oxygen Therapy: Patient Spontanous Breathing and Patient connected to face mask oxygen  Post-op Assessment: Report given to RN and Post -op Vital signs reviewed and stable  Post vital signs: Reviewed and stable  Last Vitals:  Vitals Value Taken Time  BP 117/59 12/03/21 1049  Temp 36.6 C 12/03/21 1049  Pulse 82 12/03/21 1100  Resp 21 12/03/21 1100  SpO2 100 % 12/03/21 1100  Vitals shown include unvalidated device data.  Last Pain:  Vitals:   12/03/21 0748  TempSrc: Oral  PainSc:          Complications: No notable events documented.

## 2021-12-03 NOTE — Progress Notes (Signed)
Pt scheduled for surgery this morning, report given to [facility], pt taken to OR by transport staff, pt and family reassured. [name], Grabiel Schmutz Efe

## 2021-12-03 NOTE — Progress Notes (Signed)
RN notified Fransisco Beau MD regarding pt arterial BP reading above 530 systolic and cuff BP reading 104U systolic. RN informed to go by cuff BP.

## 2021-12-03 NOTE — Progress Notes (Signed)
   12/03/21 1745  Provider Notification  Provider Name/Title P. Mannam MD  Date Provider Notified 12/03/21  Time Provider Notified 1745  Method of Notification Page  Notification Reason Other (Comment) (UOP 225 amber since arrival to unit. Minimal intake. Bladder scan 44m.)  Provider response See new orders (IVF LR @ 1049mhr)  Date of Provider Response 12/03/21  Time of Provider Response 1745

## 2021-12-03 NOTE — Progress Notes (Signed)
Patient post op, now in neuro ICU on critical care service. Hospitalists will sign off.  Please let us know when we can be of further assistance. TRH Fayrene Helper, MD

## 2021-12-03 NOTE — Anesthesia Procedure Notes (Signed)
Arterial Line Insertion Performed by: Mariea Clonts, CRNA, CRNA  Patient location: OR. Preanesthetic checklist: patient identified, IV checked, site marked, risks and benefits discussed, surgical consent, monitors and equipment checked, pre-op evaluation, timeout performed and anesthesia consent Lidocaine 1% used for infiltration and patient sedated Right, radial was placed Catheter size: 20 G Hand hygiene performed , maximum sterile barriers used  and Seldinger technique used Allen's test indicative of satisfactory collateral circulation Attempts: 2 Procedure performed without using ultrasound guided technique. Following insertion, dressing applied and Biopatch. Post procedure assessment: normal

## 2021-12-03 NOTE — Anesthesia Procedure Notes (Signed)
Procedure Name: Intubation Date/Time: 12/03/2021 8:40 AM  Performed by: Valda Favia, CRNAPre-anesthesia Checklist: Patient identified, Emergency Drugs available, Suction available and Patient being monitored Patient Re-evaluated:Patient Re-evaluated prior to induction Oxygen Delivery Method: Circle System Utilized Preoxygenation: Pre-oxygenation with 100% oxygen Induction Type: IV induction Ventilation: Mask ventilation without difficulty Laryngoscope Size: Mac and 4 Grade View: Grade I Tube type: Oral Tube size: 7.5 mm Number of attempts: 1 Airway Equipment and Method: Stylet and Oral airway Placement Confirmation: ETT inserted through vocal cords under direct vision, positive ETCO2 and breath sounds checked- equal and bilateral Secured at: 21 cm Tube secured with: Tape Dental Injury: Teeth and Oropharynx as per pre-operative assessment

## 2021-12-03 NOTE — Op Note (Signed)
Providing Compassionate, Quality Care - Together  Date of service: 12/03/2021  PREOP DIAGNOSIS:  Left mixed density subdural hematoma with 8 mm of left-to-right midline shift Aphasia  POSTOP DIAGNOSIS: Same  PROCEDURE: Left frontotemporal craniotomy for evacuation of subdural hematoma, lysis of membranes  SURGEON: Dr. Pieter Partridge C. Tionna Gigante, DO  ASSISTANT: Dr. Sherley Bounds, MD  ANESTHESIA: General Endotracheal  EBL: 50cc  SPECIMENS: None  DRAINS: Subdural ventricular drain  COMPLICATIONS: None  CONDITION: Hemodynamically stable  HISTORY: [name] is a 66 y.o. female with a history of mechanical mitral valve, chronically on Coumadin, with findings of a large acute and chronic subdural hematoma on the left with 8 mm of midline shift.  She began having aphasic episodes.  Her Coumadin was reversed.  I recommended surgical intervention given the amount of shift and symptomatology of her subdural hematoma.  Extensively discussed with patient and her son and daughter about the challenging situation being that she will need chronic anticoagulation relatively soon postoperatively given her mechanical valve.  Discussed all risks and benefits expected outcomes.  Informed consent was obtained and witnessed for left frontotemporal craniotomy for evacuation of subdural hematoma.  Answered all the patient's questions, their family's questions.  PROCEDURE IN DETAIL: The patient was brought to the operating room. After induction of general anesthesia, the patient was positioned on the operative table in the supine position with the head turned to the right to expose the left hemicrania.  The operative agreement was confirmed. All pressure points were meticulously padded. Skin incision was then marked out and prepped and draped in the usual sterile fashion. Physician driven timeout was performed.  The left frontotemporal curvilinear incision was marked, local anesthetic was injected into the planned  incision.  Using a 10 blade, incision was made sharply down to the paracranium.  Raney clips were applied.  Using Bovie electrocautery the myocutaneous flap was reflected anteriorly.  Self-retaining retractors were placed.  Using a high-speed drill, a frontotemporal craniotomy was performed and elevated in standard fashion.  The dura was then opened in a curvilinear fashion.  There was significant dural membranes that were dark purple and green identified.  Using bipolar electrocautery these were coagulated and cut sharply with Metzenbaum scissors.  A significant amount of chronic component was evacuated with suction.  I circumferentially removed the chronic and subacute clot using gentle suction and copious irrigation of all interventions along the craniotomy site.  This was performed for series of minutes with copious months of irrigation.  We expanded it was pulsatile.  There is no evidence of bleeding via direct visualization as well as copious amounts of irrigation that was clear.  The remaining membranes under the dural leaflet was coagulated with bipolar cautery.  A small ventricular catheter was placed in the subdural space and tunneled out laterally.  The dura was then reflected and closed with 4-0 Nurolon sutures.  The medial sized piece of DuraGen was placed.  Epidural hemostasis was achieved with Surgifoam and bipolar cautery.  Epidural space is noted to be Excellently hemostatic.  The craniotomy flap was then placed in its original position and affixed with cranial plating system.  The myocutaneous flap was reflected back to its original position.  Temporalis hemostasis was achieved with bipolar cautery.  I then closed the temporalis fascia with 0 Vicryl sutures.  [place] was closed with 2-0 Vicryl sutures.  Skin was closed with staples.  Sterile dressing was applied.  The drain was secured with 3-0 nylon suture.  This was  then hooked up to the gravity bag drainage system.  At the end of the case all  sponge, needle, and instrument counts were correct. The patient was then transferred to the stretcher, extubated, and taken to the post-anesthesia care unit in stable hemodynamic condition.

## 2021-12-03 NOTE — Progress Notes (Signed)
   Providing Compassionate, Quality Care - Together  NEUROSURGERY PROGRESS NOTE   S: No issues overnight. Continues to have speech difficulty  O: EXAM:  BP 137/71   Pulse 95   Temp 99.6 F (37.6 C) (Oral)   Resp 16   Ht '5\' 6"'$  (1.676 m)   Wt 106.2 kg   SpO2 91%   BMI 37.79 kg/m   Awake, alert, disoriented PERRL Difficulty with word finding, expressive aphasia CNs grossly intact  Moves all extremities equally  ASSESSMENT:  66 y.o. female with   Left mixed density subdural hematoma with midline shift  PLAN: -OR today for left frontotemporal craniotomy, evacuation of hematoma.  We discussed all risks, benefits and expected outcomes, informed consent was obtained and witnessed.  I answered all the questions of the patient as well as her daughter at bedside.  We extensively went over the risks given her history of mechanical valve on anticoagulation of being placed on heparin drip postoperatively when stable.    Thank you for allowing me to participate in this patient's care.  Please do not hesitate to call with questions or concerns.   Elwin Sleight, [facility] Cell: [phone]

## 2021-12-03 NOTE — Progress Notes (Signed)
   12/03/21 1200  Vitals  BP (!) 123/56  MAP (mmHg) 77  BP Location Left Arm  BP Method Automatic  Patient Position (if appropriate) Lying  Pulse Rate 78  Pulse Rate Source Monitor  ECG Heart Rate 78  Resp 19  Oxygen Therapy  SpO2 99 %  Art Line  Arterial Line BP 156/64  Arterial Line MAP (mmHg) 88 mmHg         12/03/21 1210  Art Line  Arterial Line BP 168/67  Arterial Line MAP (mmHg) 92 mmHg            T. Dawley notified of BP/art line not correlating. Order to remove art line. P. Mannam rounding on patient at this time and aware. Art line removed.

## 2021-12-03 NOTE — Anesthesia Postprocedure Evaluation (Signed)
Anesthesia Post Note  Patient: [name]  Procedure(s) Performed: FRONTOTEMPORAL CRANIOTOMY FOR EVACUATION SUBDURAL HEMATOMA (Left)     Patient location during evaluation: PACU Anesthesia Type: General Level of consciousness: awake and alert Pain management: pain level controlled Vital Signs Assessment: post-procedure vital signs reviewed and stable Respiratory status: spontaneous breathing, nonlabored ventilation, respiratory function stable and patient connected to nasal cannula oxygen Cardiovascular status: blood pressure returned to baseline and stable Postop Assessment: no apparent nausea or vomiting Anesthetic complications: no   No notable events documented.  Last Vitals:  Vitals:   12/03/21 1119 12/03/21 1200  BP: 136/63 (!) 123/56  Pulse: 80 78  Resp: 20 19  Temp: 36.6 C 36.7 C  SpO2: 99% 99%    Last Pain:  Vitals:   12/03/21 1200  TempSrc: Axillary  PainSc:                  Audry Pili

## 2021-12-04 ENCOUNTER — Inpatient Hospital Stay (HOSPITAL_COMMUNITY): Payer: Medicare HMO

## 2021-12-04 DIAGNOSIS — Z952 Presence of prosthetic heart valve: Secondary | ICD-10-CM | POA: Diagnosis not present

## 2021-12-04 DIAGNOSIS — S065XAA Traumatic subdural hemorrhage with loss of consciousness status unknown, initial encounter: Secondary | ICD-10-CM | POA: Diagnosis not present

## 2021-12-04 LAB — CBC WITH DIFFERENTIAL/PLATELET
Abs Immature Granulocytes: 0.07 10*3/uL (ref 0.00–0.07)
Basophils Absolute: 0 10*3/uL (ref 0.0–0.1)
Basophils Relative: 0 %
Eosinophils Absolute: 0 10*3/uL (ref 0.0–0.5)
Eosinophils Relative: 0 %
HCT: 28.4 % — ABNORMAL LOW (ref 36.0–46.0)
Hemoglobin: 8.9 g/dL — ABNORMAL LOW (ref 12.0–15.0)
Immature Granulocytes: 1 %
Lymphocytes Relative: 7 %
Lymphs Abs: 0.8 10*3/uL (ref 0.7–4.0)
MCH: 31.2 pg (ref 26.0–34.0)
MCHC: 31.3 g/dL (ref 30.0–36.0)
MCV: 99.6 fL (ref 80.0–100.0)
Monocytes Absolute: 0.7 10*3/uL (ref 0.1–1.0)
Monocytes Relative: 6 %
Neutro Abs: 9.6 10*3/uL — ABNORMAL HIGH (ref 1.7–7.7)
Neutrophils Relative %: 86 %
Platelets: 178 10*3/uL (ref 150–400)
RBC: 2.85 MIL/uL — ABNORMAL LOW (ref 3.87–5.11)
RDW: 13.2 % (ref 11.5–15.5)
WBC: 11.2 10*3/uL — ABNORMAL HIGH (ref 4.0–10.5)
nRBC: 0 % (ref 0.0–0.2)

## 2021-12-04 LAB — GLUCOSE, CAPILLARY
Glucose-Capillary: 136 mg/dL — ABNORMAL HIGH (ref 70–99)
Glucose-Capillary: 166 mg/dL — ABNORMAL HIGH (ref 70–99)
Glucose-Capillary: 129 mg/dL — ABNORMAL HIGH (ref 70–99)
Glucose-Capillary: 145 mg/dL — ABNORMAL HIGH (ref 70–99)
Glucose-Capillary: 118 mg/dL — ABNORMAL HIGH (ref 70–99)

## 2021-12-04 LAB — BASIC METABOLIC PANEL
Anion gap: 10 (ref 5–15)
BUN: 14 mg/dL (ref 8–23)
CO2: 25 mmol/L (ref 22–32)
Calcium: 9 mg/dL (ref 8.9–10.3)
Chloride: 107 mmol/L (ref 98–111)
Creatinine, Ser: 0.85 mg/dL (ref 0.44–1.00)
GFR, Estimated: >60 mL/min (ref >60)
Glucose, Bld: 173 mg/dL — ABNORMAL HIGH (ref 70–99)
Potassium: 4.3 mmol/L (ref 3.5–5.1)
Sodium: 142 mmol/L (ref 135–145)

## 2021-12-04 LAB — PROTIME-INR
INR: 1.2 (ref 0.8–1.2)
Prothrombin Time: 14.8 seconds (ref 11.4–15.2)

## 2021-12-04 LAB — MAGNESIUM: Magnesium: 1.9 mg/dL (ref 1.7–2.4)

## 2021-12-04 MED ORDER — PANTOPRAZOLE SODIUM 40 MG PO TBEC
40.0000 mg | DELAYED_RELEASE_TABLET | Freq: Every day | ORAL | Status: DC
  Administered 2021-12-04 – 2021-12-10 (×7): 40 mg via ORAL
  Filled 2021-12-04 (×7): qty 1

## 2021-12-04 MED ORDER — LEVETIRACETAM 500 MG PO TABS
500.0000 mg | ORAL_TABLET | Freq: Two times a day (BID) | ORAL | Status: DC
  Administered 2021-12-04 – 2021-12-10 (×12): 500 mg via ORAL
  Filled 2021-12-04 (×12): qty 1

## 2021-12-04 MED ORDER — CARVEDILOL 12.5 MG PO TABS
12.5000 mg | ORAL_TABLET | Freq: Two times a day (BID) | ORAL | Status: DC
  Administered 2021-12-04 – 2021-12-10 (×13): 12.5 mg via ORAL
  Filled 2021-12-04 (×13): qty 1

## 2021-12-04 MED ORDER — AMLODIPINE BESYLATE 10 MG PO TABS
10.0000 mg | ORAL_TABLET | Freq: Every day | ORAL | Status: DC
  Administered 2021-12-04 – 2021-12-10 (×7): 10 mg via ORAL
  Filled 2021-12-04 (×7): qty 1

## 2021-12-04 NOTE — Progress Notes (Signed)
Patient ID: [name], female   DOB: 09/24/1955, 66 y.o.   MRN: [id number] Looks good postop day 1.  Still aphasic moving all extremities well.  Incision is clean dry and intact.  We reviewed the head CT which looks good and we remove the drain.  PT OT.  Will likely need rehab.  Likely start heparin drip without a bolus tomorrow or Monday for mechanical valve.

## 2021-12-04 NOTE — Progress Notes (Addendum)
NAME:  [name], MRN:  [id number], DOB:  [birth year], LOS: 2 ADMISSION DATE:  12/01/2021, CONSULTATION DATE:  12/03/21 REFERRING MD:  Dawley CHIEF COMPLAINT:  AMS   History of Present Illness:  [name] is a 66 y.o. female who has a PMH of A.fib and mechanical MVR in 2014 on chronic Coumadin, CKD, DM. She presented to ED 11/23 with bleeding gums after dental extraction 11/10. As part of her workup, she had CT head which demonstrated acute on chronic bilateral DH with 61m L to R MLS. [facility] given Kcentra [facility] ED [facility].  She was taken to OR 11/24 for left frontotemporal craniotomy for evacuation of SDH, lysis, of membranes.  Post operatively, she returned to the ICU and PCCM asked to assist with her care while in ICU.  Pertinent  Medical History:  has Hypertension; Type 2 diabetes mellitus ([place]; Hypercholesterolemia; Mediastinal lymphadenopathy; Asthma; Atrial fibrillation ([place]; Chronic systolic dysfunction of left ventricle; Snoring; Chronic diastolic CHF (congestive heart failure) ([place]; Mitral regurgitation; Tricuspid regurgitation; Rheumatic fever; Obesity (BMI 30-39.9); CHF (congestive heart failure) ([place]; MR (mitral regurgitation); S/P mitral valve replacement with metallic valve; S/P tricuspid valve repair; S/P Maze operation for atrial fibrillation; S/P TVR (tricuspid valve repair); S/P MVR (mitral valve replacement); CVA (cerebral infarction); Atrial flutter ([place]; Mitral valve disorders(424.0); Heart valve replaced by other means; Encounter for therapeutic drug monitoring; LBBB (left bundle branch block); Special screening for malignant neoplasms, colon; Uterine cancer ([place]; Endometrial cancer ([facility]; Iron deficiency anemia; Stage 3a chronic kidney disease (CKD) ([place]; Drug-induced hyperglycemia; Anemia, chronic disease; Pancytopenia, acquired ([place]; Peripheral neuropathy due to chemotherapy ([place]; Acute subdural hematoma  ([place]; Chronic anticoagulation - on coumadin for mechanical mitral valve, afib; and Subdural hematoma ([place] on their problem list.  Significant Hospital Events: Including procedures, antibiotic start and stop dates in addition to other pertinent events   11/23 admit 11/24 to OR for evacuation of SDH  Interim History / Subjective:   Started on IV fluids for low urine output.  Objective:  Blood pressure (!) 138/58, pulse 70, temperature 98.9 F (37.2 C), temperature source Oral, resp. rate (!) 24, height '5\' 6"'$  (1.676 m), weight 101.8 kg, SpO2 98 %.        Intake/Output Summary (Last 24 hours) at 12/04/2021 0836 Last data filed at 12/04/2021 0700 Gross per 24 hour  Intake 2945.54 ml  Output 860 ml  Net 2085.54 ml   Filed Weights   12/02/21 1941 12/04/21 0500  Weight: 106.2 kg 101.8 kg    Examination: Gen:      No acute distress HEENT:  EOMI, sclera anicteric Neck:     No masses; no thyromegaly Lungs:    Clear to auscultation bilaterally; normal respiratory effort CV:         Regular rate and rhythm; no murmurs Abd:      + bowel sounds; soft, non-tender; no palpable masses, no distension Ext:    No edema; adequate peripheral perfusion Skin:      Warm and dry; no rash Neuro: alert and oriented x 3 Psych: normal mood and affect   Labs/imaging personally reviewed:  Glucose 173, WBC 11.2, hemoglobin 8.9, platelets 178  Assessment & Plan:   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 - Post op care per NSGY. - Repeat CT head 11/25 and if stable, discuss with NSGY to confirm ok with Heparin gtt. - Continue Keppra.  Hx. A.fib, Mitral stenosis  s/p MVR 2014 on chronic coumadin, dCHF (Echo from EF 60-65%), HTN, HLD. - Cardiology following, recommending Heparin when OK with neurosurgery (appears to be Sunday 11/26 if repeat CT scan stable). - Hold home Coumadin. - Continue home Amiodarone. - PRN Hydralazine. - Restart home hypertension medications.  Start with  amlodipine and Coreg - Hold  Atorvastatin, Irbesartan    Hx DM. - SSI coverage  Hx CVA. - Supportive care.  Best practice (evaluated daily):  Diet/type: NPO - advance as tolerated DVT prophylaxis: SCD GI prophylaxis: N/A Lines: N/A Foley:  N/A Code Status:  full code Last date of multidisciplinary goals of care discussion: None yet.  Critical care time: NA   Marshell Garfinkel MD [facility] See Amion for pager  If no response to pager , please call [phone] until 7pm After 7:00 pm call Elink  [phone] 12/04/2021, 8:36 AM

## 2021-12-04 NOTE — Progress Notes (Signed)
Progress Note  Patient Name: [name] Date of Encounter: 12/04/2021  Primary Cardiologist: Larae Grooms, MD  Following in background, chart reviewed.  Patient status post left frontotemporal craniotomy for management of SDH on November 24, neurosurgery is still following with plan for repeat imaging.  Inpatient Medications    Scheduled Meds:  amLODipine  10 mg Oral Daily   atorvastatin  40 mg Oral Daily   carvedilol  12.5 mg Oral BID   docusate sodium  100 mg Oral BID   insulin aspart  0-15 Units Subcutaneous TID WC   insulin aspart  0-5 Units Subcutaneous QHS   irbesartan  300 mg Oral Daily   pantoprazole (PROTONIX) IV  40 mg Intravenous QHS   Continuous Infusions:  levETIRAcetam Stopped (12/04/21 0548)   PRN Meds: acetaminophen **OR** acetaminophen, hydrALAZINE, HYDROcodone-acetaminophen, labetalol, morphine injection, ondansetron **OR** ondansetron (ZOFRAN) IV, promethazine   Vital Signs    Vitals:   12/04/21 0900 12/04/21 1000 12/04/21 1100 12/04/21 1200  BP: 129/66 (!) 134/54 (!) 120/56 129/64  Pulse: 70 73 75 67  Resp: (!) 24 19 (!) 22 (!) 23  Temp:      TempSrc:      SpO2: 97% 97% 94% 96%  Weight:      Height:        Intake/Output Summary (Last 24 hours) at 12/04/2021 1203 Last data filed at 12/04/2021 1000 Gross per 24 hour  Intake 1538.9 ml  Output 760 ml  Net 778.9 ml   Filed Weights   12/02/21 1941 12/04/21 0500  Weight: 106.2 kg 101.8 kg    Telemetry    Sinus rhythm.  Personally reviewed.  ECG    An ECG dated 12/02/2021 was personally reviewed today and demonstrated:  Sinus rhythm with left bundle branch block (old).  Labs    Chemistry Recent Labs  Lab 12/02/21 0148 12/02/21 0945 12/03/21 0238 12/04/21 0424  NA 139 139 141 142  K 4.1 3.7 4.0 4.3  CL 105 106 104 107  CO2 '24 26 25 25  '$ GLUCOSE 108* 96 131* 173*  BUN '18 15 17 14  '$ CREATININE 0.96 0.78 1.07* 0.85  CALCIUM 9.1 9.0 9.2 9.0  PROT 8.2* 8.2*  --   --    ALBUMIN 3.9 3.8  --   --   AST 35 32  --   --   ALT 24 25  --   --   ALKPHOS 71 69  --   --   BILITOT 0.9 1.2  --   --   GFRNONAA >60 >60 57* >60  ANIONGAP '10 7 12 10     '$ Hematology Recent Labs  Lab 12/02/21 0945 12/03/21 0238 12/04/21 0424  WBC 10.0 8.4 11.2*  RBC 3.18* 2.88* 2.85*  HGB 9.9* 9.3* 8.9*  HCT 31.7* 28.8* 28.4*  MCV 99.7 100.0 99.6  MCH 31.1 32.3 31.2  MCHC 31.2 32.3 31.3  RDW 13.4 13.4 13.2  PLT 194 180 178    Radiology    CT HEAD WO CONTRAST  Result Date: 12/04/2021 CLINICAL DATA:  66 year old female with altered mental status and bilateral subdural hematoma postoperative day 1 status post left frontotemporal craniotomy and surgical evacuation. EXAM: CT HEAD WITHOUT CONTRAST TECHNIQUE: Contiguous axial images were obtained from the base of the skull through the vertex without intravenous contrast. RADIATION DOSE REDUCTION: This exam was performed according to the departmental dose-optimization program which includes automated exposure control, adjustment of the mA and/or kV according to patient size and/or use of  iterative reconstruction technique. COMPARISON:  Head CT 12/02/2021 and earlier. FINDINGS: Brain: Percutaneous left subdural drain in place via new left frontotemporal craniotomy. Regressed mixed density left side subdural hematoma, which remains mildly lobulated. Maximal thickness now 9 mm, previously up to 16 mm). And substantially decreased mass effect on the superior left hemisphere. Small volume postoperative pneumocephalus. Decreased midline shift, now trace. Improved left lateral ventricle. Smaller mixed density right side subdural hematoma is stable, generally 4 mm thickness or less. No new areas of intracranial hemorrhage. Basilar cisterns now appear normal. No ventriculomegaly. Stable basal ganglia vascular calcifications. Stable gray-white matter differentiation throughout the brain. No cortically based acute infarct identified. Vascular: Calcified  atherosclerosis at the skull base. Skull: New left frontotemporal craniotomy. Sinuses/Orbits: Visualized paranasal sinuses and mastoids are stable and well aerated. Other: Postoperative changes to the left scalp now. Skin staples in place. Percutaneous drain in place. Orbits appear stable, negative. IMPRESSION: 1. Regressed left side subdural status post surgical evacuation, subdural drain placement. Mixed density Left SDH now up to 9 mm. Stable smaller ~4 mm Right SDH. Decreased intracranial mass effect with now trace rightward midline shift. 2. No new intracranial abnormality. Electronically Signed   By: Genevie Ann M.D.   On: 12/04/2021 07:13   CT HEAD WO CONTRAST (5MM)  Result Date: 12/02/2021 CLINICAL DATA:  Subdural hematoma. EXAM: CT HEAD WITHOUT CONTRAST TECHNIQUE: Contiguous axial images were obtained from the base of the skull through the vertex without intravenous contrast. RADIATION DOSE REDUCTION: This exam was performed according to the departmental dose-optimization program which includes automated exposure control, adjustment of the mA and/or kV according to patient size and/or use of iterative reconstruction technique. COMPARISON:  Head CT 12/02/2021. FINDINGS: Brain: Mixed density subdural collections overlying the bilateral cerebral hemispheres, unchanged in size from the head CT performed earlier today. These likely reflect acute on chronic subdural hematomas or hematohygromas. As before, the collection on the left measures up to 16 mm in thickness and the collection on the right measures up to 6 mm in thickness. Unchanged mass effect upon the left cerebral hemispheres with partial effacement of left lateral ventricle an 8 mm right midline shift. No demarcated cortical infarct. No extra-axial fluid collection. No evidence of an intracranial mass. Vascular: No hyperdense vessel.  Atherosclerotic calcifications. Skull: No fracture or aggressive osseous lesion. Sinuses/Orbits: No mass or acute  finding within the imaged orbits. No significant paranasal sinus disease at the imaged levels. IMPRESSION: Unchanged mixed density subdural collections overlying the bilateral cerebral hemispheres, likely reflecting acute on chronic subdural hematomas or hematohygromas. As before, these collections measure up to 16 mm in thickness on the left and 6 mm on the right. Unchanged mass effect upon the left cerebral hemisphere with partial effacement of the left lateral ventricle and 8 mm rightward midline shift. Electronically Signed   By: Kellie Simmering D.O.   On: 12/02/2021 13:36    Assessment & Plan    1.  History of mitral stenosis with mechanical MVR 2014 on chronic Coumadin.  Anticoagulation presently held and received Kcentra at presentation.  Plan is for initiation of IV heparin once cleared by neurosurgery, possibly next 24 hours if follow-up head CT stable.  2.  Paroxysmal to persistent atrial fibrillation.  Currently in sinus rhythm with old left bundle branch block by ECG.  Continue Coreg and amiodarone.  She is on telemetry.  Signed, Rozann Lesches, MD  12/04/2021, 12:03 PM

## 2021-12-05 DIAGNOSIS — S065XAA Traumatic subdural hemorrhage with loss of consciousness status unknown, initial encounter: Secondary | ICD-10-CM | POA: Diagnosis not present

## 2021-12-05 LAB — CBC WITH DIFFERENTIAL/PLATELET
Abs Immature Granulocytes: 0.03 10*3/uL (ref 0.00–0.07)
Basophils Absolute: 0 10*3/uL (ref 0.0–0.1)
Basophils Relative: 0 %
Eosinophils Absolute: 0 10*3/uL (ref 0.0–0.5)
Eosinophils Relative: 1 %
HCT: 28 % — ABNORMAL LOW (ref 36.0–46.0)
Hemoglobin: 8.6 g/dL — ABNORMAL LOW (ref 12.0–15.0)
Immature Granulocytes: 0 %
Lymphocytes Relative: 20 %
Lymphs Abs: 1.6 10*3/uL (ref 0.7–4.0)
MCH: 31.5 pg (ref 26.0–34.0)
MCHC: 30.7 g/dL (ref 30.0–36.0)
MCV: 102.6 fL — ABNORMAL HIGH (ref 80.0–100.0)
Monocytes Absolute: 0.6 10*3/uL (ref 0.1–1.0)
Monocytes Relative: 7 %
Neutro Abs: 5.9 10*3/uL (ref 1.7–7.7)
Neutrophils Relative %: 72 %
Platelets: 174 10*3/uL (ref 150–400)
RBC: 2.73 MIL/uL — ABNORMAL LOW (ref 3.87–5.11)
RDW: 13.4 % (ref 11.5–15.5)
WBC: 8.2 10*3/uL (ref 4.0–10.5)
nRBC: 0 % (ref 0.0–0.2)

## 2021-12-05 LAB — BASIC METABOLIC PANEL
Anion gap: 5 (ref 5–15)
BUN: 12 mg/dL (ref 8–23)
CO2: 27 mmol/L (ref 22–32)
Calcium: 8.3 mg/dL — ABNORMAL LOW (ref 8.9–10.3)
Chloride: 109 mmol/L (ref 98–111)
Creatinine, Ser: 0.79 mg/dL (ref 0.44–1.00)
GFR, Estimated: >60 mL/min (ref >60)
Glucose, Bld: 119 mg/dL — ABNORMAL HIGH (ref 70–99)
Potassium: 3.9 mmol/L (ref 3.5–5.1)
Sodium: 141 mmol/L (ref 135–145)

## 2021-12-05 LAB — PROTIME-INR
INR: 1.2 (ref 0.8–1.2)
Prothrombin Time: 14.7 seconds (ref 11.4–15.2)

## 2021-12-05 LAB — GLUCOSE, CAPILLARY
Glucose-Capillary: 114 mg/dL — ABNORMAL HIGH (ref 70–99)
Glucose-Capillary: 153 mg/dL — ABNORMAL HIGH (ref 70–99)
Glucose-Capillary: 160 mg/dL — ABNORMAL HIGH (ref 70–99)
Glucose-Capillary: 123 mg/dL — ABNORMAL HIGH (ref 70–99)

## 2021-12-05 LAB — MAGNESIUM: Magnesium: 1.9 mg/dL (ref 1.7–2.4)

## 2021-12-05 MED ORDER — AMIODARONE HCL 200 MG PO TABS
200.0000 mg | ORAL_TABLET | Freq: Every day | ORAL | Status: DC
  Administered 2021-12-05 – 2021-12-10 (×6): 200 mg via ORAL
  Filled 2021-12-05 (×6): qty 1

## 2021-12-05 NOTE — Evaluation (Signed)
Physical Therapy Evaluation Patient Details Name: [name] MRN: [id number] DOB: [birth year] Today's Date: 12/05/2021  History of Present Illness  66 year old African-American female presents to the ER  with chief complaint of bleeding gums. Patient had several teeth removed by oral surgery on 11/19/2021. 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 11/24 Left frontotemporal craniotomy for evacuation of subdural hematoma, lysis of membranes; history of A-fib, history of mechanical mitral valve placement on chronic Coumadin, CKD stage IIIa, type 2 diabetes, chronic diastolic heart failure.  Clinical Impression   Pt admitted with above diagnosis. Lives at home with spouse (out a lot to take care of his mother), in a single-level home with a few steps to enter; Prior to admission, pt independent, driving, helping care for her grandchildren; Presents to PT with functional dependencies, weakness, R UE dyscoordination, aphasia; Needs mod assist to stand, and min assist to walk short distances with RW; Multimodal cueing for safety; Shows good rehab potential;  Pt currently with functional limitations due to the deficits listed below (see PT Problem List). Pt will benefit from skilled PT to increase their independence and safety with mobility to allow discharge to the venue listed below.          Recommendations for follow up therapy are one component of a multi-disciplinary discharge planning process, led by the attending physician.  Recommendations may be updated based on patient status, additional functional criteria and insurance authorization.  Follow Up Recommendations Acute inpatient rehab (3hours/day)      Assistance Recommended at Discharge Frequent or constant Supervision/Assistance  Patient can return home with the following  A lot of help with walking  and/or transfers;A lot of help with bathing/dressing/bathroom    Equipment Recommendations Rolling walker (2 wheels);BSC/3in1  Recommendations for Other Services  OT consult;Speech consult;Rehab consult    Functional Status Assessment Patient has had a recent decline in their functional status and demonstrates the ability to make significant improvements in function in a reasonable and predictable amount of time.     Precautions / Restrictions Precautions Precautions: Fall Precaution Comments: monitor O2 Restrictions Weight Bearing Restrictions: (P) No      Mobility  Bed Mobility                    Transfers Overall transfer level: Needs assistance Equipment used: Rolling walker (2 wheels) Transfers: Sit to/from Stand Sit to Stand: Mod assist           General transfer comment: multimodal cues to initiate with anterior weight shift    Ambulation/Gait Ambulation/Gait assistance: Min assist, +2 safety/equipment (chair push) Gait Distance (Feet): 30 Feet (20, seated rest, 10) Assistive device: Rolling walker (2 wheels) Gait Pattern/deviations: Decreased step length - right, Decreased step length - left, Wide base of support Gait velocity: slowed     General Gait Details: Multimodal cueing and eventually handheld assist to help R hand to RW handle; slow, wide, short steps  Stairs            Wheelchair Mobility    Modified Rankin (Stroke Patients Only)       Balance     Sitting balance-Leahy Scale: Good       Standing balance-Leahy Scale: Poor  Pertinent Vitals/Pain Pain Assessment Pain Assessment: Faces Faces Pain Scale: No hurt Pain Intervention(s): Monitored during session    Home Living Family/patient expects to be discharged to:: Inpatient rehab Living Arrangements: Spouse/significant other;Children (son--who works) Available Help at Discharge: Family;Available PRN/intermittently Type of  Home: House Home Access: Stairs to enter   [employer] of Steps: 2 and 1 (no rails)   Home Layout: One level Home Equipment: Shower seat Additional Comments: spouse currently leaves the house to take care of his mother    Prior Function Prior Level of Function : Independent/Modified Independent;Driving                     Hand Dominance   Dominant Hand: Right    Extremity/Trunk Assessment   Upper Extremity Assessment Upper Extremity Assessment: Defer to OT evaluation RUE Deficits / Details: delayed in comparison to LUE for shoulder flexion    Lower Extremity Assessment Lower Extremity Assessment: Generalized weakness       Communication   Communication: Expressive difficulties  Cognition Arousal/Alertness: Awake/alert Behavior During Therapy: Flat affect Overall Cognitive Status: Difficult to assess Area of Impairment: Following commands, Safety/judgement, Awareness, Problem solving                       Following Commands: Follows one step commands inconsistently Safety/Judgement: Decreased awareness of safety, Decreased awareness of deficits Awareness: Intellectual Problem Solving: Slow processing, Decreased initiation, Difficulty sequencing, Requires verbal cues, Requires tactile cues          General Comments General comments (skin integrity, edema, etc.): Session initiated on Room Air, and O2 sats decr to 86 (observed lowest); started supplemental O2 2 L and sats incr to 98-100%    Exercises     Assessment/Plan    PT Assessment Patient needs continued PT services  PT Problem List Decreased strength;Decreased activity tolerance;Decreased balance;Decreased mobility;Decreased coordination;Decreased cognition;Decreased knowledge of use of DME;Decreased safety awareness;Decreased knowledge of precautions       PT Treatment Interventions DME instruction;Gait training;Stair training;Functional mobility training;Therapeutic  activities;Therapeutic exercise;Balance training;Neuromuscular re-education;Cognitive remediation;Patient/family education    PT Goals (Current goals can be found in the Care Plan section)  Acute Rehab PT Goals Patient Stated Goal: Did not state, agreeable to OOB PT Goal Formulation: With patient/family Time For Goal Achievement: 12/19/21 Potential to Achieve Goals: Good    Frequency Min 4X/week     Co-evaluation               AM-PAC PT "6 Clicks" Mobility  Outcome Measure Help needed turning from your back to your side while in a flat bed without using bedrails?: A Little Help needed moving from lying on your back to sitting on the side of a flat bed without using bedrails?: A Lot Help needed moving to and from a bed to a chair (including a wheelchair)?: A Lot Help needed standing up from a chair using your arms (e.g., wheelchair or bedside chair)?: A Lot Help needed to walk in hospital room?: Total Help needed climbing 3-5 steps with a railing? : Total 6 Click Score: 11    End of Session Equipment Utilized During Treatment: Gait belt Activity Tolerance: Patient tolerated treatment well Patient left: in chair;with call bell/phone within reach;with chair alarm set Nurse Communication: Mobility status PT Visit Diagnosis: Unsteadiness on feet (R26.81);Other abnormalities of gait and mobility (R26.89);Muscle weakness (generalized) (M62.81);Other symptoms and signs involving the nervous system (R29.898)    Time: 1413-1450 PT Time Calculation (min) (ACUTE ONLY): 37  min   Charges:   PT Evaluation $PT Eval Moderate Complexity: 1 Mod PT Treatments $Gait Training: 8-22 mins        Roney Marion, PT  Acute Rehabilitation Services Office [phone]   [place] 12/05/2021, 3:59 PM

## 2021-12-05 NOTE — Evaluation (Addendum)
Speech Language Pathology Evaluation Patient Details Name: [name] MRN: [id number] DOB: [birth year] Today's Date: 12/05/2021 Time: 0109-3235 SLP Time Calculation (min) (ACUTE ONLY): 15 min  Problem List:  Patient Active Problem List   Diagnosis Date Noted   Acute subdural hematoma ([place]) 12/02/2021   Chronic anticoagulation - on coumadin for mechanical mitral valve, afib 12/02/2021   Subdural hematoma ([place]) 12/02/2021   Pancytopenia, acquired ([place]) 06/21/2019   Peripheral neuropathy due to chemotherapy ([place]) 06/21/2019   Anemia, chronic disease 05/21/2019   Drug-induced hyperglycemia 02/25/2019   Stage 3a chronic kidney disease (CKD) ([place]) 02/04/2019   Iron deficiency anemia 01/18/2019   Endometrial cancer ([place]) 01/02/2019   Uterine cancer ([place]) 12/27/2018   Special screening for malignant neoplasms, colon 04/27/2016   LBBB (left bundle branch block) 10/07/2013   Encounter for therapeutic drug monitoring 03/19/2013   Mitral valve disorders(424.0) 10/12/2012   Heart valve replaced by other means 10/12/2012   Atrial flutter ([place])    CVA (cerebral infarction) 04/14/2012   S/P TVR (tricuspid valve repair) 03/19/2012   S/P MVR (mitral valve replacement) 03/19/2012   History of mitral valve replacement with mechanical valve 02/15/2012   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   S/P Maze operation for atrial fibrillation 02/15/2012   CHF (congestive heart failure) ([place]) 02/06/2012   MR (mitral regurgitation) 02/06/2012   Tricuspid regurgitation 01/16/2012   Rheumatic fever 01/16/2012   Obesity (BMI 30-39.9) 01/16/2012   Mitral regurgitation    Chronic diastolic CHF (congestive heart failure) ([place]) 12/19/2011   Snoring 02/28/2011   Atrial fibrillation ([place]) 57/32/2025   Chronic systolic dysfunction of left ventricle 01/05/2011   Hypertension 10/18/2010   Type 2 diabetes mellitus ([place]) 10/18/2010   Hypercholesterolemia 10/18/2010   Mediastinal lymphadenopathy 10/18/2010    Asthma 10/18/2010   Past Medical History:  Past Medical History:  Diagnosis Date   Abnormal chest CT    Anemia    Aortic atherosclerosis (HCC)    Asthma    Atrial enlargement, left    severe   Atrial fibrillation ([place]) 01/05/2011   Chronic persistent, failed DCCV    Atrial flutter (HCC)    Bronchospasm 1998   Cardiomegaly    Carotid bruit    Chronic diastolic congestive heart failure (HCC)    Diabetes mellitus    Type II   Ejection fraction < 50%    35-40%,    Heart murmur    History of blood transfusion    History of radiation therapy 03/18/19-04/15/19   endometrial - vaginal brachytherapy -  Dr. Sondra Come    Hypercholesterolemia    Hypertension    Junctional rhythm 09/14/2018   Noted on EKG   Left bundle branch block (LBBB) 09/14/2018   Noted on EKG   LVH (left ventricular hypertrophy) 10/10/2016   Moderate, Noted on ECHO   Mitral regurgitation    Obesity    Obesity (BMI 30-39.9) 01/16/2012   Persistent atrial fibrillation (HCC)    Polyp of rectum    Rheumatic fever 01/16/2012   Reported during childhood   S/P Maze operation for atrial fibrillation 02/15/2012   Complete biatrial lesion set using bipolar radiofrequency and cryothermy ablation with clipping of LA appendage   S/P mitral valve replacement with metallic valve 42/70/6237   52m Sorin [employer] Optiform mechanical prosthesis   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   275mEdwards mc3 ring annuloplasty   Shortness of breath    with exertion   Sleep apnea    DOES NOT HAVE CPAP  Stroke [facility]) 2014   Post op , left arm weakness   Tricuspid regurgitation 01/16/2012   Uterine cancer [facility])    Past Surgical History:  Past Surgical History:  Procedure Laterality Date   CARDIAC CATHETERIZATION  >5 years   CARDIOVASCULAR STRESS TEST  09/2010   CARDIOVERSION  03/25/2011   Procedure: CARDIOVERSION;  Surgeon: Jettie Booze, MD;  Location: [place];  Service: Cardiovascular;  Laterality: N/A;    CARDIOVERSION N/A 07/27/2012   Procedure: CARDIOVERSION;  Surgeon: Jettie Booze, MD;  Location: [place];  Service: Cardiovascular;  Laterality: N/A;   CESAREAN SECTION     COLONOSCOPY WITH PROPOFOL N/A 04/27/2016   Procedure: COLONOSCOPY WITH PROPOFOL;  Surgeon: Wilford Corner, MD;  Location: [facility] ENDOSCOPY;  Service: Endoscopy;  Laterality: N/A;   INTRAOPERATIVE TRANSESOPHAGEAL ECHOCARDIOGRAM  02/15/2012   Procedure: INTRAOPERATIVE TRANSESOPHAGEAL ECHOCARDIOGRAM;  Surgeon: Rexene Alberts, MD;  Location: [place];  Service: Open Heart Surgery;  Laterality: N/A;   MAZE  02/15/2012   Procedure: MAZE;  Surgeon: Rexene Alberts, MD;  Location: [place];  Service: Open Heart Surgery;  Laterality: N/A;   MITRAL VALVE REPLACEMENT  02/15/2012   Procedure: MITRAL VALVE (MV) REPLACEMENT;  Surgeon: Rexene Alberts, MD;  Location: [place];  Service: Open Heart Surgery;  Laterality: N/A;   ROBOTIC ASSISTED LAPAROSCOPIC HYSTERECTOMY AND SALPINGECTOMY Bilateral 01/02/2019   Procedure: XI ROBOTIC ASSISTED LAPAROSCOPIC TOTAL HYSTERECTOMY WITH BILATERAL SALPINGOOPHORECTOMY, SENTINEL LYMPH NODE BIOPSY;  Surgeon: [name], MD;  Location: [facility];  Service: Gynecology;  Laterality: Bilateral;   TEE WITHOUT CARDIOVERSION  12/21/2011   Procedure: TRANSESOPHAGEAL ECHOCARDIOGRAM (TEE);  Surgeon: Jettie Booze, MD;  Location: [place];  Service: Cardiovascular;  Laterality: N/A;   TOOTH EXTRACTION N/A 11/19/2021   Procedure: DENTAL EXTRACTION TEETH NUMBER 14, 19, 30, 31;  Surgeon: Diona Browner, DMD;  Location: [place];  Service: Oral Surgery;  Laterality: N/A;   TRICUSPID VALVE REPLACEMENT  02/15/2012   Procedure: TRICUSPID VALVE REPAIR;  Surgeon: Rexene Alberts, MD;  Location: [place];  Service: Open Heart Surgery;  Laterality: N/A;   TUBAL LIGATION     at time of her c-section   HPI:  Pt is a 66 year old female presented to ED 11/23 with bleeding gums after dental extraction 11/10. CT head 11/23:  Bilateral mixed density subdural collections, most likely acute on chronic hemorrhage, measuring up to 16 mm on the left and 6 mm on the right, with 8 mm left to right midline shift and effacement of the left-greater-than-right sulci. Pt s/p left frontotemporal craniotomy for evacuation of SDH, lysis, of membranes 11/24.  PMH: A-fib, history of mechanical mitral valve placement on chronic Coumadin, CKD stage IIIa, type 2 diabetes, chronic diastolic heart failure presents to the ER today with chief complaint of bleeding gums.  Patient had several teeth removed by oral surgery on 11/19/2021.   Assessment / Plan / Recommendation Clinical Impression  Pt presents with severe aphasia characterized by impairments in receptive and expressive language with more significant deficits in verbal expression. She was able to follow some 1-step commands, but she exhibited difficulty with auditory comprehension of of 2-step commands and perseveration was noted on "yeah" with yes/no questions. Pt verbally produced two utterances including "right there" and "okay". She demonstrated difficulty with confrontational naming and with completion of even automatic sequences. Limited verbal output revealed moderate dysarthria characterized by imprecise articulation and reduced vocal intensity. Pt's cognitive skills could not be thoroughly evaluated due to the nature and  severity of pt's aphasia, but difficulty was noted with sustained attention during various tasks. Skilled SLP services are clinically indicated at this time.    SLP Assessment  SLP Recommendation/Assessment: Patient needs continued Speech Lanaguage Pathology Services SLP Visit Diagnosis: Dysarthria and anarthria (R47.1);Aphasia (R47.01)    Recommendations for follow up therapy are one component of a multi-disciplinary discharge planning process, led by the attending physician.  Recommendations may be updated based on patient status, additional functional criteria and  insurance authorization.    Follow Up Recommendations   (Continued SLP services at level of care recommended by PT/OT)    Assistance Recommended at Discharge  Frequent or constant Supervision/Assistance  Functional Status Assessment Patient has had a recent decline in their functional status and demonstrates the ability to make significant improvements in function in a reasonable and predictable amount of time.  Frequency and Duration min 2x/week  2 weeks      SLP Evaluation Cognition  Overall Cognitive Status: Difficult to assess (due to aphasia)      Comprehension  Auditory Comprehension Overall Auditory Comprehension: Impaired Yes/No Questions: Impaired Basic Immediate Environment Questions:  (3/5; only affirmative responses) Complex Questions:  (2/6) Commands: Impaired One Step Basic Commands:  (3/6) Conversation: Simple Reading Comprehension Reading Status: Impaired Word level: Impaired    Expression Expression Primary Mode of Expression: Verbal Verbal Expression Overall Verbal Expression: Impaired Initiation: Impaired Automatic Speech: Counting (0/10) Repetition: Impaired Level of Impairment: Word level (0/4) Naming: Impairment Responsive: Not tested Confrontation:  (0/5) Verbal Errors: Perseveration   Oral / Motor  Oral Motor/Sensory Function Overall Oral Motor/Sensory Function:  (difficult to assess) Motor Speech Overall Motor Speech: Impaired Phonation: Low vocal intensity Articulation: Impaired Level of Impairment: Phrase Intelligibility: Intelligibility reduced Word: 50-74% accurate Phrase: 25-49% accurate           Bernita Beckstrom I. Hardin Negus, [place], [place] Office number [phone]  Horton Marshall 12/05/2021, 9:47 AM

## 2021-12-05 NOTE — Evaluation (Signed)
Occupational Therapy Evaluation Patient Details Name: [name] MRN: [id number] DOB: 01-01-1956 Today's Date: 12/05/2021   History of Present Illness 66 year old African-American female presents to the ER today with chief complaint of bleeding gums. Patient had several teeth removed by oral surgery on 11/19/2021. 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 11/24 Left frontotemporal craniotomy for evacuation of subdural hematoma, lysis of membranes; history of A-fib, history of mechanical mitral valve placement on chronic Coumadin, CKD stage IIIa, type 2 diabetes, chronic diastolic heart failure.   Clinical Impression   This 66 yo female admitted with above presents to acute OT with PLOF of being totally independent with basic ADLs, IADLs, and driving. She currently is mod A-total A for basic ADLs more due to cognitive processing than physical need but her balance in standing and ambulating is an issue (where as before she did not have to use an AD). She will continue to benefit from acute OT with follow up on AIR. On RA pt tended to hover around 89-91%.     Recommendations for follow up therapy are one component of a multi-disciplinary discharge planning process, led by the attending physician.  Recommendations may be updated based on patient status, additional functional criteria and insurance authorization.   Follow Up Recommendations  Acute inpatient rehab (3hours/day)     Assistance Recommended at Discharge Frequent or constant Supervision/Assistance  Patient can return home with the following A lot of help with walking and/or transfers;A lot of help with bathing/dressing/bathroom;Assistance with cooking/housework;Assistance with feeding;Help with stairs or ramp for entrance;Assist for transportation;Direct supervision/assist for financial  management;Direct supervision/assist for medications management    Functional Status Assessment  Patient has had a recent decline in their functional status and demonstrates the ability to make significant improvements in function in a reasonable and predictable amount of time.  Equipment Recommendations  Other (comment) (TBD next venue)    Recommendations for Other Services Rehab consult     Precautions / Restrictions Precautions Precautions: Fall Precaution Comments: monitor O2 Restrictions Weight Bearing Restrictions: No      Mobility Bed Mobility Overal bed mobility: Needs Assistance Bed Mobility: Supine to Sit     Supine to sit: Mod assist, HOB elevated          Transfers Overall transfer level: Needs assistance Equipment used: 1 person hand held assist Transfers: Sit to/from Stand, Bed to chair/wheelchair/BSC Sit to Stand: Mod assist           General transfer comment: ambulation Mod A with Bil HHA (from one therapist standing in front of her) 5 steps forward and 7 steps back with tactile cues to weight shift      Balance Overall balance assessment: Needs assistance Sitting-balance support: No upper extremity supported, Feet supported Sitting balance-Leahy Scale: Good     Standing balance support: Bilateral upper extremity supported Standing balance-Leahy Scale: Poor                             ADL either performed or assessed with clinical judgement   ADL Overall ADL's : Needs assistance/impaired   Eating/Feeding Details (indicate cue type and reason): Dtr reports yesterday she feed herself, today she had to feed her with the only thing the patient did was initiate holding cup to drink from and shook head yes/no to if she wanted what dtr was  trying to feed her. Grooming: Maximal assistance;Sitting Grooming Details (indicate cue type and reason): EOB Upper Body Bathing: Maximal assistance;Sitting Upper Body Bathing Details (indicate cue  type and reason): EOB Lower Body Bathing: Maximal assistance Lower Body Bathing Details (indicate cue type and reason): Mod A sit<>stand Upper Body Dressing : Total assistance;Sitting Upper Body Dressing Details (indicate cue type and reason): EOB Lower Body Dressing: Moderate assistance Lower Body Dressing Details (indicate cue type and reason): Mod A sit<>stand Toilet Transfer: Moderate assistance;Stand-pivot Toilet Transfer Details (indicate cue type and reason): Bil HHA--simulated bed>5 steps forward>7 steps back to sit on bed Toileting- Clothing Manipulation and Hygiene: Total assistance Toileting - Clothing Manipulation Details (indicate cue type and reason): Mod A sit<>stand             Vision   Vision Assessment?: Vision impaired- to be further tested in functional context Additional Comments: should be wearing glasses but does not per dtr ([name]) in room. Unable to following testing for vision (question right inattention)            Pertinent Vitals/Pain Pain Assessment Pain Assessment: PAINAD Breathing: normal Negative Vocalization: none Facial Expression: smiling or inexpressive Body Language: relaxed Consolability: no need to console PAINAD Score: 0     Hand Dominance Right   Extremity/Trunk Assessment Upper Extremity Assessment Upper Extremity Assessment: LUE deficits/detail;RUE deficits/detail RUE Deficits / Details: delayed in comparison to LUE for shoulder flexion           Communication Communication Communication: No difficulties   Cognition Arousal/Alertness: Awake/alert Behavior During Therapy: Flat affect Overall Cognitive Status: Difficult to assess Area of Impairment: Following commands, Safety/judgement, Awareness, Problem solving                       Following Commands: Follows one step commands inconsistently Safety/Judgement: Decreased awareness of safety, Decreased awareness of deficits Awareness: Intellectual Problem  Solving: Slow processing, Decreased initiation, Difficulty sequencing, Requires verbal cues, Requires tactile cues                  Home Living Family/patient expects to be discharged to:: Inpatient rehab Living Arrangements: Spouse/significant other;Children (son--who works) Available Help at Discharge: Family;Available PRN/intermittently Type of Home: House Home Access: Stairs to enter Entrance Stairs-Number of Steps: 2 and 1 (no rails)   Home Layout: One level     Bathroom Shower/Tub: Corporate investment banker: Standard     Home Equipment: Building services engineer Comments: spouse currently leaves the house to take care of his mother  Lives With: Spouse    Prior Functioning/Environment Prior Level of Function : Independent/Modified Independent;Driving                        OT Problem List: Decreased strength;Impaired balance (sitting and/or standing);Impaired vision/perception;Decreased cognition;Decreased safety awareness;Impaired UE functional use      OT Treatment/Interventions: Self-care/ADL training;DME and/or AE instruction;Balance training;Patient/family education;Visual/perceptual remediation/compensation;Therapeutic activities    OT Goals(Current goals can be found in the care plan section) Acute Rehab OT Goals Patient Stated Goal: dtr--for pt to go to inpatient rehab here at [facility] OT Goal Formulation: With patient/family Time For Goal Achievement: 12/19/21 Potential to Achieve Goals: Good  OT Frequency: Min 2X/week       AM-PAC OT "6 Clicks" Daily Activity     Outcome Measure Help from another person eating meals?: A Lot Help from another person taking care of personal grooming?: A Lot Help from another  person toileting, which includes using toliet, bedpan, or urinal?: Total Help from another person bathing (including washing, rinsing, drying)?: A Lot Help from another person to put on and taking off regular upper body  clothing?: A Lot Help from another person to put on and taking off regular lower body clothing?: A Lot 6 Click Score: 11   End of Session Equipment Utilized During Treatment: Gait belt Nurse Communication: Mobility status  Activity Tolerance: Patient tolerated treatment well Patient left:  (working with PT)  OT Visit Diagnosis: Unsteadiness on feet (R26.81);Other abnormalities of gait and mobility (R26.89);Muscle weakness (generalized) (M62.81);Low vision, both eyes (H54.2);Other symptoms and signs involving cognitive function;Cognitive communication deficit (R41.841) Symptoms and signs involving cognitive functions: Nontraumatic SAH                Time: 1345-1420 OT Time Calculation (min): 35 min Charges:  OT General Charges $OT Visit: 1 Visit OT Evaluation $OT Eval Moderate Complexity: 1 Mod OT Treatments $Self Care/Home Management : 8-22 mins  Golden Circle, OTR/L Acute Rehab Services A[facility] [phone] Office [phone]    Almon Register 12/05/2021, 2:46 PM

## 2021-12-05 NOTE — Progress Notes (Signed)
   12/05/21 0600  Urine Characteristics  Bladder Scan Volume (mL) 500 mL   MD informed, requested an order for a foley. Patient denies any discomfort.

## 2021-12-05 NOTE — Progress Notes (Signed)
OT Cancellation Note  Patient Details Name: [name] MRN: [id number] DOB: May 22, 1955   Cancelled Treatment:    Reason Eval/Treat Not Completed: Other (comment). Pt just starting to eat her lunch. Will re-attempt eval at later time.  Golden Circle, OTR/L Acute Rehab Services [facility] [phone] Office [phone]    Almon Register 12/05/2021, 11:53 AM

## 2021-12-05 NOTE — Progress Notes (Addendum)
NAME:  [name], MRN:  [id number], DOB:  1955-12-02, LOS: 3 ADMISSION DATE:  12/01/2021, CONSULTATION DATE:  12/03/21 REFERRING MD:  [place] CHIEF COMPLAINT:  AMS   History of Present Illness:  [name] is a 66 y.o. female who has a PMH of A.fib and mechanical MVR in 2014 on chronic Coumadin, CKD, DM. She presented to ED 11/23 with bleeding gums after dental extraction 11/10. As part of her workup, she had CT head which demonstrated acute on chronic bilateral DH with 19m L to R MLS. She was given Kcentra in ED [facility].  She was taken to OR 11/24 for left frontotemporal craniotomy for evacuation of SDH, lysis, of membranes.  Post operatively, she returned to the ICU and PCCM asked to assist with her care while in ICU.  Pertinent  Medical History:  has Hypertension; Type 2 diabetes mellitus ([place]; Hypercholesterolemia; Mediastinal lymphadenopathy; Asthma; Atrial fibrillation ([place]; Chronic systolic dysfunction of left ventricle; Snoring; Chronic diastolic CHF (congestive heart failure) ([place]; Mitral regurgitation; Tricuspid regurgitation; Rheumatic fever; Obesity (BMI 30-39.9); CHF (congestive heart failure) ([place]; MR (mitral regurgitation); History of mitral valve replacement with mechanical valve; S/P tricuspid valve repair; S/P Maze operation for atrial fibrillation; S/P TVR (tricuspid valve repair); S/P MVR (mitral valve replacement); CVA (cerebral infarction); Atrial flutter ([place]; Mitral valve disorders(424.0); Heart valve replaced by other means; Encounter for therapeutic drug monitoring; LBBB (left bundle branch block); Special screening for malignant neoplasms, colon; Uterine cancer ([place]; Endometrial cancer ([place]; Iron deficiency anemia; Stage 3a chronic kidney disease (CKD) ([place]; Drug-induced hyperglycemia; Anemia, chronic disease; Pancytopenia, acquired ([place]; Peripheral neuropathy due to chemotherapy ([place]; Acute subdural  hematoma ([place]; Chronic anticoagulation - on coumadin for mechanical mitral valve, afib; and Subdural hematoma ([place] on their problem list.  Significant Hospital Events: Including procedures, antibiotic start and stop dates in addition to other pertinent events   11/23 admit 11/24 to OR for evacuation of SDH 11/25 Repeat CT is stable. EVD taken out. Transfer to progressive  Interim History / Subjective:   Transfer out of ICU.  More awake today  Objective:  Blood pressure (!) 129/56, pulse 67, temperature 98.1 F (36.7 C), temperature source Oral, resp. rate 19, height '5\' 6"'$  (1.676 m), weight 101.8 kg, SpO2 100 %.        Intake/Output Summary (Last 24 hours) at 12/05/2021 0717 Last data filed at 12/05/2021 0631 Gross per 24 hour  Intake 199.93 ml  Output 750 ml  Net -550.07 ml   Filed Weights   12/02/21 1941 12/04/21 0500 12/05/21 0443  Weight: 106.2 kg 101.8 kg 101.8 kg    Examination: Blood pressure (!) 117/45, pulse 69, temperature 100.2 F (37.9 C), temperature source Axillary, resp. rate 20, height '5\' 6"'$  (1.676 m), weight 101.8 kg, SpO2 99 %. Gen:      No acute distress HEENT:  EOMI, sclera anicteric Neck:     No masses; no thyromegaly Lungs:    Clear to auscultation bilaterally; normal respiratory effort CV:         Regular rate and rhythm; no murmurs Abd:      + bowel sounds; soft, non-tender; no palpable masses, no distension Ext:    No edema; adequate peripheral perfusion Skin:      Warm and dry; no rash Neuro: [place], answers questions  Labs/imaging personally reviewed:  Labs are stable No new imaging  Assessment & Plan:   Acute on chronic bilateral SDH - s/p OR  evacuation 11/24. Hx CVA - Post op care per NSGY. - Repeat CT head 11/25 is stable.  Restart heparin drip when cleared by neurosurgery - Continue Keppra.  Hx. A.fib, Mitral stenosis s/p MVR 2014 on chronic coumadin, dCHF (Echo from EF 60-65%), HTN, HLD. - Cardiology following, recommending  Heparin when OK with neurosurgery (appears to be Sunday 11/26 if repeat CT scan stable). - Hold home Coumadin. - Continue home Amiodarone. - PRN labetelol, hydralazine - Restarted Norvasc, Coreg, Avapro and Lipitor  Hx DM. - SSI coverage  Stable for transfer back to hospitalist service  Best practice (evaluated daily):  Diet/type: NPO - advance as tolerated DVT prophylaxis: SCD GI prophylaxis: N/A Lines: N/A Foley:  N/A Code Status:  full code Last date of multidisciplinary goals of care discussion: None yet.  Critical care time: NA   Marshell Garfinkel MD [facility] See Amion for pager  If no response to pager , please call [phone] until 7pm After 7:00 pm call Elink  [phone] 12/05/2021, 7:17 AM

## 2021-12-05 NOTE — Progress Notes (Signed)
Inpatient Rehab Admissions Coordinator:   Per therapy recommendations,  patient was screened for CIR candidacy by Elany Felix, MS, CCC-SLP . At this time, Pt. Appears to be a a potential candidate for CIR. I will request   order for rehab consult per protocol for full assessment. Please contact me any with questions.  Joey Lierman, MS, CCC-SLP Rehab Admissions Coordinator  [phone] (celll) [phone] (office)  

## 2021-12-05 NOTE — Progress Notes (Signed)
Patient ID: [name], female   DOB: Oct 14, 1955, 66 y.o.   MRN: [id number] She looks better on exam today.  She is more awake and alert and interactive.  She can state her name and follow commands briskly.  She moves all 4 extremities equally.  Her dressing is intact.  I am pleased with her progress.  Mobilize.

## 2021-12-05 NOTE — Progress Notes (Signed)
   Progress Note  Patient Name: [name] Date of Encounter: 12/05/2021  Primary Cardiologist: Larae Grooms, MD  Interval chart reviewed since yesterday.  Patient progressing per review of neurosurgical note.  Head CT from yesterday showed regression in left-sided subdural hematoma with decreased mass effect, no new abnormalities.  Remains in sinus rhythm.  Currently on amiodarone, Norvasc, Coreg, Avapro, and Lipitor.  Anticipate resumption of IV heparin within the next 24 hours once cleared formally by neurosurgery.  Signed, Rozann Lesches, MD  12/05/2021, 12:17 PM

## 2021-12-05 NOTE — Progress Notes (Signed)
PCCM note  Discussed with Dr. Ronnald Ramp, Neurosurgery.  Will start heparin drip tomorrow without bolus.  Marshell Garfinkel MD [facility] See Amion for pager  If no response to pager , please call [phone] until 7pm After 7:00 pm call Elink  [phone] 12/05/2021, 1:55 PM

## 2021-12-06 ENCOUNTER — Inpatient Hospital Stay (HOSPITAL_COMMUNITY): Payer: Medicare HMO

## 2021-12-06 ENCOUNTER — Ambulatory Visit: Payer: Medicare HMO

## 2021-12-06 DIAGNOSIS — I4891 Unspecified atrial fibrillation: Secondary | ICD-10-CM

## 2021-12-06 DIAGNOSIS — S065XAA Traumatic subdural hemorrhage with loss of consciousness status unknown, initial encounter: Secondary | ICD-10-CM | POA: Diagnosis not present

## 2021-12-06 LAB — CBC WITH DIFFERENTIAL/PLATELET
Abs Immature Granulocytes: 0.02 10*3/uL (ref 0.00–0.07)
Basophils Absolute: 0 10*3/uL (ref 0.0–0.1)
Basophils Relative: 0 %
Eosinophils Absolute: 0.1 10*3/uL (ref 0.0–0.5)
Eosinophils Relative: 1 %
HCT: 24.3 % — ABNORMAL LOW (ref 36.0–46.0)
Hemoglobin: 7.9 g/dL — ABNORMAL LOW (ref 12.0–15.0)
Immature Granulocytes: 0 %
Lymphocytes Relative: 19 %
Lymphs Abs: 1.3 10*3/uL (ref 0.7–4.0)
MCH: 32.5 pg (ref 26.0–34.0)
MCHC: 32.5 g/dL (ref 30.0–36.0)
MCV: 100 fL (ref 80.0–100.0)
Monocytes Absolute: 0.6 10*3/uL (ref 0.1–1.0)
Monocytes Relative: 9 %
Neutro Abs: 4.9 10*3/uL (ref 1.7–7.7)
Neutrophils Relative %: 71 %
Platelets: 140 10*3/uL — ABNORMAL LOW (ref 150–400)
RBC: 2.43 MIL/uL — ABNORMAL LOW (ref 3.87–5.11)
RDW: 13 % (ref 11.5–15.5)
WBC: 6.8 10*3/uL (ref 4.0–10.5)
nRBC: 0 % (ref 0.0–0.2)

## 2021-12-06 LAB — BASIC METABOLIC PANEL
Anion gap: 5 (ref 5–15)
BUN: 12 mg/dL (ref 8–23)
CO2: 27 mmol/L (ref 22–32)
Calcium: 8.3 mg/dL — ABNORMAL LOW (ref 8.9–10.3)
Chloride: 108 mmol/L (ref 98–111)
Creatinine, Ser: 0.75 mg/dL (ref 0.44–1.00)
GFR, Estimated: >60 mL/min (ref >60)
Glucose, Bld: 129 mg/dL — ABNORMAL HIGH (ref 70–99)
Potassium: 3.8 mmol/L (ref 3.5–5.1)
Sodium: 140 mmol/L (ref 135–145)

## 2021-12-06 LAB — TYPE AND SCREEN
ABO/RH(D): O POS
Antibody Screen: POSITIVE
Donor AG Type: NEGATIVE
Donor AG Type: NEGATIVE
Donor AG Type: NEGATIVE
Donor AG Type: NEGATIVE
Unit division: 0
Unit division: 0
Unit division: 0
Unit division: 0

## 2021-12-06 LAB — BPAM RBC
Blood Product Expiration Date: 202312182359
Blood Product Expiration Date: 202312242359
Blood Product Expiration Date: 202312242359
Blood Product Expiration Date: 202312242359
Unit Type and Rh: 5100
Unit Type and Rh: 5100
Unit Type and Rh: 5100
Unit Type and Rh: 5100

## 2021-12-06 LAB — PROTIME-INR
INR: 1.2 (ref 0.8–1.2)
Prothrombin Time: 15.3 seconds — ABNORMAL HIGH (ref 11.4–15.2)

## 2021-12-06 LAB — GLUCOSE, CAPILLARY
Glucose-Capillary: 121 mg/dL — ABNORMAL HIGH (ref 70–99)
Glucose-Capillary: 134 mg/dL — ABNORMAL HIGH (ref 70–99)
Glucose-Capillary: 138 mg/dL — ABNORMAL HIGH (ref 70–99)
Glucose-Capillary: 139 mg/dL — ABNORMAL HIGH (ref 70–99)

## 2021-12-06 LAB — MAGNESIUM: Magnesium: 1.9 mg/dL (ref 1.7–2.4)

## 2021-12-06 MED ORDER — POLYETHYLENE GLYCOL 3350 17 G PO PACK
17.0000 g | PACK | Freq: Every day | ORAL | Status: DC
  Administered 2021-12-06 – 2021-12-10 (×5): 17 g via ORAL
  Filled 2021-12-06 (×5): qty 1

## 2021-12-06 MED ORDER — HEPARIN (PORCINE) 25000 UT/250ML-% IV SOLN
1150.0000 [IU]/h | INTRAVENOUS | Status: DC
  Administered 2021-12-06: 1000 [IU]/h via INTRAVENOUS
  Administered 2021-12-07: 1200 [IU]/h via INTRAVENOUS
  Administered 2021-12-08 – 2021-12-09 (×2): 1150 [IU]/h via INTRAVENOUS
  Filled 2021-12-06 (×5): qty 250

## 2021-12-06 NOTE — Progress Notes (Signed)
  Inpatient Rehabilitation Admissions Coordinator   Met with patient and daughter at bedside for rehab assessment. We discussed goals and expectations of a possible CIR admit. They prefer CIR for rehab. Family can provide expected caregiver support that is recommended. Noted plans to readmit to ICU fo monitoring while resuming Heparin. I will begin Auth with [facility] for possible Cir admit . Please call me with any questions.   Danne Baxter, RN, MSN Rehab Admissions Coordinator [phone]

## 2021-12-06 NOTE — Progress Notes (Signed)
Progress Note  Patient Name: [name] Date of Encounter: 12/06/2021  Primary Cardiologist: Larae Grooms, MD  HPI:   Patient status post left frontotemporal craniotomy for management of SDH on November 24, neurosurgery following. Seen today with daughter at bedside. Feeling well without CP or SOB. Hx of mechanical MVR.  Inpatient Medications    Scheduled Meds:  amiodarone  200 mg Oral Daily   amLODipine  10 mg Oral Daily   atorvastatin  40 mg Oral Daily   carvedilol  12.5 mg Oral BID   docusate sodium  100 mg Oral BID   insulin aspart  0-15 Units Subcutaneous TID WC   insulin aspart  0-5 Units Subcutaneous QHS   irbesartan  300 mg Oral Daily   levETIRAcetam  500 mg Oral BID   pantoprazole  40 mg Oral Daily   Continuous Infusions:   PRN Meds: acetaminophen **OR** acetaminophen, hydrALAZINE, HYDROcodone-acetaminophen, labetalol, morphine injection, ondansetron **OR** ondansetron (ZOFRAN) IV, promethazine   Vital Signs    Vitals:   12/05/21 2315 12/06/21 0200 12/06/21 0307 12/06/21 0817  BP: 128/62  (!) 142/66 (!) 131/53  Pulse: 77 82 80 86  Resp: '20 20 20 '$ (!) 22  Temp: 98.3 F (36.8 C)  98.6 F (37 C) 98.9 F (37.2 C)  TempSrc: Oral  Oral Oral  SpO2: 94% 96% 92% 94%  Weight:   106.1 kg   Height:        Intake/Output Summary (Last 24 hours) at 12/06/2021 1024 Last data filed at 12/06/2021 2725 Gross per 24 hour  Intake --  Output 2400 ml  Net -2400 ml   Filed Weights   12/04/21 0500 12/05/21 0443 12/06/21 0307  Weight: 101.8 kg 101.8 kg 106.1 kg    Telemetry    Sinus rhythm.  Personally reviewed.  ECG    An ECG dated 12/02/2021 was personally reviewed today and demonstrated:  Sinus rhythm with left bundle branch block (old).  Exam:   GEN: No acute distress.   Cardiac: RRR, soft systolic murmur RSB. Mechanical valve closure sound at S1.  Respiratory: no increased work of breathing. GI: Soft, nontender, non-distended  MS: No edema; No  deformity. Psych: Normal affect   Labs    Chemistry Recent Labs  Lab 12/02/21 0148 12/02/21 0945 12/03/21 0238 12/04/21 0424 12/05/21 0428 12/06/21 0423  NA 139 139   < > 142 141 140  K 4.1 3.7   < > 4.3 3.9 3.8  CL 105 106   < > 107 109 108  CO2 24 26   < > '25 27 27  '$ GLUCOSE 108* 96   < > 173* 119* 129*  BUN 18 15   < > '14 12 12  '$ CREATININE 0.96 0.78   < > 0.85 0.79 0.75  CALCIUM 9.1 9.0   < > 9.0 8.3* 8.3*  PROT 8.2* 8.2*  --   --   --   --   ALBUMIN 3.9 3.8  --   --   --   --   AST 35 32  --   --   --   --   ALT 24 25  --   --   --   --   ALKPHOS 71 69  --   --   --   --   BILITOT 0.9 1.2  --   --   --   --   GFRNONAA >60 >60   < > >60 >60 >60  ANIONGAP 10 7   < >  $'10 5 5   'b$ < > = values in this interval not displayed.     Hematology Recent Labs  Lab 12/04/21 0424 12/05/21 0428 12/06/21 0423  WBC 11.2* 8.2 6.8  RBC 2.85* 2.73* 2.43*  HGB 8.9* 8.6* 7.9*  HCT 28.4* 28.0* 24.3*  MCV 99.6 102.6* 100.0  MCH 31.2 31.5 32.5  MCHC 31.3 30.7 32.5  RDW 13.2 13.4 13.0  PLT 178 174 140*    Radiology    CT HEAD WO CONTRAST (5MM)  Result Date: 12/06/2021 CLINICAL DATA:  66 year old female with bilateral mixed density subdural hematomas postoperative day 3 left frontotemporal craniotomy, lysis of membranes and evacuation of hematoma. EXAM: CT HEAD WITHOUT CONTRAST TECHNIQUE: Contiguous axial images were obtained from the base of the skull through the vertex without intravenous contrast. RADIATION DOSE REDUCTION: This exam was performed according to the departmental dose-optimization program which includes automated exposure control, adjustment of the mA and/or kV according to patient size and/or use of iterative reconstruction technique. COMPARISON:  12/04/2021 and earlier. FINDINGS: Brain: Percutaneous left subdural drain has been removed since 12/04/2021. Mixed density left side subdural hematoma is stable since 12/04/2021, with a focal lobulated component up to 9 mm in  thickness, but is mostly 7-8 mm thickness elsewhere. Small volume residual pneumocephalus along the anterior left frontal convexity. Smaller mixed density right side subdural measuring up to 4-5 mm in thickness is stable. No new areas of intracranial hemorrhage identified. Stable trace rightward midline shift. Stable mild mass effect on the lateral ventricles. No ventriculomegaly. Stable gray-white matter differentiation throughout the brain. Basilar cisterns remain normal. No cortically based acute infarct identified. Vascular: Calcified atherosclerosis at the skull base. Skull: Stable recent left frontotemporal craniotomy. Sinuses/Orbits: Visualized paranasal sinuses and mastoids are stable and well aerated. Other: Percutaneous left subdural drain has been removed. Postoperative changes to the scalp, skin staples remain in place. Orbits appear negative. IMPRESSION: 1. Left subdural drain removed. Unchanged mixed density Left > Right SDH since 12/04/2021. Residual hematoma up to 9 mm on the left and 5 mm on the right. 2. Stable mild intracranial mass effect with trace rightward midline shift. 3. No new intracranial abnormality. Electronically Signed   By: Genevie Ann M.D.   On: 12/06/2021 05:24    Assessment & Plan    1.  History of mitral stenosis with mechanical MVR 2014 on chronic Coumadin.  Anticoagulation presently held and received Kcentra at presentation.  Plan is for initiation of IV heparin today once transferred to ICU for close monitoring.   2.  Paroxysmal atrial fibrillation.  Currently in sinus rhythm with old left bundle branch block by ECG.  Continue Coreg and amiodarone. Continue telemetry.  Signed, Elouise Munroe, MD  12/06/2021, 10:24 AM

## 2021-12-06 NOTE — Plan of Care (Signed)
Was called by neurosurgeon Dr. Richmond Campbell, patient with mechanical mitral valve was on Coumadin had spontaneous subdural bleed requiring surgical evacuation of the hematoma.  Patient was transferred out of ICU last night, neurosurgery wanted the patient to be transferred back to ICU for the next 48 hours as they are going to start her on heparin drip for her mechanical mitral valve and wanted to be monitored in ICU for 2 days on the heparin drip.  Case discussed with ICU team transfer order placed.

## 2021-12-06 NOTE — Progress Notes (Signed)
Physical Therapy Treatment Patient Details Name: [name] MRN: [id number] DOB: 1955/01/15 Today's Date: 12/06/2021   History of Present Illness 66 year old African-American female presents to the ER  with chief complaint of bleeding gums. Patient had several teeth removed by oral surgery on 11/19/2021. 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 11/24 Left frontotemporal craniotomy for evacuation of subdural hematoma, lysis of membranes; history of A-fib, history of mechanical mitral valve placement on chronic Coumadin, CKD stage IIIa, type 2 diabetes, chronic diastolic heart failure.    PT Comments    Pt with improved ambulation tolerance this date however continues to demo L inattention, word finding difficulties, delayed processing, decreased insight to safety and deficits, impaired balance, and increased fall risk. Pt to cont to benefit from AIR upon d/c to maximize functional return for safe transition home with spouse. Aware Dr. Reatha Armour is requesting pt be transferred back to ICU for close monitoring when she is started on a heparin drip. Acute PT to cont to follow.    Recommendations for follow up therapy are one component of a multi-disciplinary discharge planning process, led by the attending physician.  Recommendations may be updated based on patient status, additional functional criteria and insurance authorization.  Follow Up Recommendations  Acute inpatient rehab (3hours/day)     Assistance Recommended at Discharge Frequent or constant Supervision/Assistance  Patient can return home with the following A lot of help with walking and/or transfers;A lot of help with bathing/dressing/bathroom   Equipment Recommendations  Rolling walker (2 wheels);BSC/3in1    Recommendations for Other Services OT consult;Speech consult;Rehab consult      Precautions / Restrictions Precautions Precautions: Fall Precaution Comments: monitor O2, drops into mid 80s on room air Restrictions Weight Bearing Restrictions: No     Mobility  Bed Mobility Overal bed mobility: Needs Assistance Bed Mobility: Supine to Sit     Supine to sit: Min assist     General bed mobility comments: max verbal and tactile directional cues, step by step to complete task, minA for safety, increased time    Transfers Overall transfer level: Needs assistance Equipment used: Rolling walker (2 wheels) Transfers: Sit to/from Stand Sit to Stand: Mod assist           General transfer comment: multimodal cues to initiate with anterior weight shift, verbal cues for safe hand placement    Ambulation/Gait Ambulation/Gait assistance: Min assist, +2 safety/equipment (chair push) Gait Distance (Feet): 120 Feet Assistive device: Rolling walker (2 wheels) Gait Pattern/deviations: Decreased step length - right, Decreased step length - left, Wide base of support Gait velocity: slowed Gait velocity interpretation: <1.31 ft/sec, indicative of household ambulator   General Gait Details: multimodal directional cues, pt with L inattention constantly vearing R requiring max verbal cues and min/modA for safe walker management. pt continuously holding walker too close and stepping past the walker in the front, SpO2 >90% on 1Lo2 via [place]   Stairs             Wheelchair Mobility    Modified Rankin (Stroke Patients Only) Modified Rankin (Stroke Patients Only) Pre-Morbid Rankin Score: No significant disability Modified Rankin: Moderately severe disability     Balance Overall balance assessment: Needs assistance Sitting-balance support: No upper extremity supported, Feet supported Sitting balance-Leahy Scale: Good     Standing balance support: Bilateral upper extremity supported Standing balance-Leahy Scale: Poor Standing balance comment: dependent on  external support                            Cognition Arousal/Alertness: Awake/alert Behavior During Therapy: Flat affect Overall Cognitive Status: Difficult to assess Area of Impairment: Following commands, Safety/judgement, Awareness, Problem solving, Attention, Memory, Orientation                 Orientation Level: Disoriented to, Time (slow to respond, states only first name when asked name, verbal cues for last name) Current Attention Level: Focused Memory: Decreased short-term memory Following Commands: Follows one step commands inconsistently, Follows one step commands with increased time Safety/Judgement: Decreased awareness of safety, Decreased awareness of deficits Awareness: Intellectual Problem Solving: Slow processing, Decreased initiation, Difficulty sequencing, Requires verbal cues, Requires tactile cues General Comments: pt very delayed, pt requiring verbal cues to look L suspecting L inattention, decreased insight to safety and deficits        Exercises      General Comments General comments (skin integrity, edema, etc.): SpO2 drops into mid 80s on RA      Pertinent Vitals/Pain Pain Assessment Pain Assessment: No/denies pain    Home Living                          Prior Function            PT Goals (current goals can now be found in the care plan section) Acute Rehab PT Goals PT Goal Formulation: With patient/family Time For Goal Achievement: 12/19/21 Potential to Achieve Goals: Good Progress towards PT goals: Progressing toward goals    Frequency    Min 4X/week      PT Plan Current plan remains appropriate    Co-evaluation              AM-PAC PT "6 Clicks" Mobility   Outcome Measure  Help needed turning from your back to your side while in a flat bed without using bedrails?: A Little Help needed moving from lying on your back to sitting on the side of a flat bed without using bedrails?: A Lot Help needed  moving to and from a bed to a chair (including a wheelchair)?: A Lot Help needed standing up from a chair using your arms (e.g., wheelchair or bedside chair)?: A Lot Help needed to walk in hospital room?: A Lot Help needed climbing 3-5 steps with a railing? : Total 6 Click Score: 12    End of Session Equipment Utilized During Treatment: Gait belt Activity Tolerance: Patient tolerated treatment well Patient left: in chair;with call bell/phone within reach;with chair alarm set;with family/visitor present Nurse Communication: Mobility status PT Visit Diagnosis: Unsteadiness on feet (R26.81);Other abnormalities of gait and mobility (R26.89);Muscle weakness (generalized) (M62.81);Other symptoms and signs involving the nervous system (R29.898)     Time: 8032-1224 PT Time Calculation (min) (ACUTE ONLY): 28 min  Charges:  $Gait Training: 23-37 mins                     Kittie Plater, PT, DPT Acute Rehabilitation Services Secure chat preferred Office #: [phone]    Berline Lopes 12/06/2021, 9:58 AM

## 2021-12-06 NOTE — Progress Notes (Signed)
Mobility Specialist Progress Note   12/06/21 1538  Mobility  Activity Transferred from chair to bed  Level of Assistance Contact guard assist, steadying assist  Assistive Device Front wheel walker  Distance Ambulated (ft) 4 ft  Activity Response Tolerated well  $Mobility charge 1 Mobility   Received pt trying to get to bed w/ assistance from a family member. Intervened to safely get pt to bed as they were tied up in there lines. Expressed the importance of having staff around when wanting to move and pt and family member stated to understand. Call bell placed by side.    Holland Falling Mobility Specialist Acute Rehab Office:  [phone]

## 2021-12-06 NOTE — Progress Notes (Signed)
NAME:  [name], MRN:  [id number], DOB:  [birth year], LOS: 4 ADMISSION DATE:  12/01/2021, CONSULTATION DATE:  12/03/21 REFERRING MD:  [place] CHIEF COMPLAINT:  AMS   History of Present Illness:  [name] is a 66 y.o. female who has a PMH of A.fib and mechanical MVR in 2014 on chronic Coumadin, CKD, DM. She presented to ED 11/23 with bleeding gums after dental extraction 11/10. As part of her workup, she had CT head which demonstrated acute on chronic bilateral DH with 82m L to R MLS. [facility] was given Kcentra in ED [facility].  She was taken to OR 11/24 for left frontotemporal craniotomy for evacuation of SDH, lysis, of membranes.  Post operatively, she returned to the ICU and PCCM asked to assist with her care while in ICU  Pertinent  Medical History:  has Hypertension; Type 2 diabetes mellitus (HRosepine; Hypercholesterolemia; Mediastinal lymphadenopathy; Asthma; Atrial fibrillation ([place]St[place]; Chronic systolic dysfunction of left ventricle; Snoring; Chronic diastolic CHF (congestive heart failure) (HN[place]; Mitral regurgitation; Tricuspid regurgitation; Rheumatic fever; Obesity (BMI 30-39.9); CHF (congestive heart failure) (HCarls[place]; MR (mitral regurgitation); History of mitral valve replacement with mechanical valve; S/P tricuspid valve repair; S/P Maze operation for atrial fibrillation; S/P TVR (tricuspid valve repair); S/P MVR (mitral valve replacement); CVA (cerebral infarction); Atrial flutter (HL[place]; Mitral valve disorders(424.0); Heart valve replaced by other means; Encounter for therapeutic drug monitoring; LBBB (left bundle branch block); Special screening for malignant neoplasms, colon; Uterine cancer (HS[place]; Endometrial cancer (H[place]; Iron deficiency anemia; Stage 3a chronic kidney disease (CKD) (HE[place]; Drug-induced hyperglycemia; Anemia, chronic disease; Pancytopenia, acquired (HAlfar[place]; Peripheral neuropathy due to chemotherapy (HScam[place]; Acute subdural  hematoma (HLakewood Club; Chronic anticoagulation - on coumadin for mechanical mitral valve, afib; and Subdural hematoma (HMerna on their problem list.  Significant Hospital Events: Including procedures, antibiotic start and stop dates in addition to other pertinent events   11/23 admit 11/24 to OR for evacuation of SDH 11/25 Repeat CT is stable. EVD taken out. Transfer to progressive 11/27 NSG wanting patient transferred to ICU for closer monitoring for 48 hours to resume heparin  Interim History / Subjective:  Up in chair NAD  Objective:  Blood pressure (!) 131/53, pulse 86, temperature 98.9 F (37.2 C), temperature source Oral, resp. rate (!) 22, height '5\' 6"'$  (1.676 m), weight 106.1 kg, SpO2 94 %.        Intake/Output Summary (Last 24 hours) at 12/06/2021 1021 Last data filed at 12/06/2021 03154Gross per 24 hour  Intake --  Output 2400 ml  Net -2400 ml    Filed Weights   12/04/21 0500 12/05/21 0443 12/06/21 0307  Weight: 101.8 kg 101.8 kg 106.1 kg    Examination: General:  NAD HEENT: MM pink/moist Neuro: Aox3; MAE CV: s1s2, RRR, no m/r/g PULM:  dim clear BS bilaterally GI: soft, bsx4 active  Extremities: warm/dry, no edema  Skin: no rashes or lesions appreciated  Labs/imaging personally reviewed:  Labs are stable No new imaging  Assessment & Plan:   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 Hx CVA P: -Per NSG -continue keppra -will move to icu to start on heparin for 48 hours -frequent neuro checks -continue keppra -further imaging per nsg  Hx. A.fib, Mitral stenosis s/p MVR 2014 on chronic coumadin, dCHF (Echo from EF 60-65%), HTN, HLD. P: -cards following; appreciate recs -move to icu and start on heparin -continue amio -continue home Norvasc, Coreg, Avapro and Lipitor  Hx DM P: - SSI and cbg monitoring  Best practice (evaluated daily):  Diet/type: NPO - advance as tolerated DVT prophylaxis: SCD; heparin gtt when moved to ICU GI  prophylaxis: N/A Lines: N/A Foley:  N/A Code Status:  full code Last date of multidisciplinary goals of care discussion: 11/27 spoke with patient and daughter at bedside  Critical care time: NA   JD Rexene Agent [place] [facility] 12/06/2021, 10:34 AM  Please see [email or web address] for pager details.  From 7A-7P if no response, please call [phone]. After hours, please call ELink [phone].

## 2021-12-06 NOTE — Progress Notes (Signed)
ANTICOAGULATION CONSULT NOTE  Pharmacy Consult:  Heparin Indication: Mechanical mitral valve  Allergies  Allergen Reactions   Chlorhexidine Other (See Comments)    Unknown    Patient Measurements: Height: '5\' 6"'$  (167.6 cm) Weight: 106.1 kg (233 lb 14.5 oz) IBW/kg (Calculated) : 59.3 Heparin Dosing Weight: 84 kg  Vital Signs: Temp: 98.8 F (37.1 C) (11/27 1202) Temp Source: Oral (11/27 1202) BP: 110/59 (11/27 1201) Pulse Rate: 80 (11/27 1202)  Labs: Recent Labs    12/04/21 0424 12/05/21 0428 12/06/21 0423  HGB 8.9* 8.6* 7.9*  HCT 28.4* 28.0* 24.3*  PLT 178 174 140*  LABPROT 14.8 14.7 15.3*  INR 1.2 1.2 1.2  CREATININE 0.85 0.79 0.75    Estimated Creatinine Clearance: 84 mL/min (by C-G formula based on SCr of 0.75 mg/dL).  Assessment: 17 YOF with history of mechanical MVR in 2014 and Afib on Coumadin PTA admitted 11/23 with bleeding gums s/p teeth removal on 11/19/21.  CT on admit also showed acute subdural hematoma with midline shift.  She received KCentra, Vitamin K '10mg'$  and TXA on 11/23.  S/p crani for evacuation on 12/03/21.  Pharmacy consulted to dose IV heparin.   Heparin level sub-therapeutic at 0.19 on 1000 units/hr.  No issue with infusion nor bleeding per discussion with RN.  CBC stable.  Goal of Therapy:  Heparin level 0.3-0.4 units/ml per NSGY Monitor platelets by anticoagulation protocol: Yes   Plan:  Increase IV heparin to 1200 units/hr - no bolus Check 6 hr heparin level Daily heparin level and CBC  Sarinah Doetsch D. Mina Marble, PharmD, BCPS, [place] 12/07/2021, 9:14 AM

## 2021-12-06 NOTE — PMR Pre-admission (Signed)
PMR Admission Coordinator Pre-Admission Assessment  Patient: [name] is an 66 y.o., female MRN: [id number] DOB: [birth year] Height: _0  (167.6 cm) Weight: 95 kg  Insurance Information HMO: yes    PPO:      PCP:      IPA:      80/20:      OTHER:  PRIMARY: [employer]      Policy#: [id number]      Subscriber: pt CM Name: Dot      Phone#: [phone] ext [phone]     Fax#: [phone] Pre-Cert#: [id number]   approved for 7 days f/u with Loraine at ext [phone] same fax   Employer:  Benefits:  Phone #: [phone]     Name: 11/27 Eff. Date: 01/10/21     Deduct: none      Out of Pocket Max: $3400       CIR: $295 co pay per day days 1 until 6      SNF: no copay per day days 1 until 20; $196 co pay per day days 21 until 100 Outpatient: $10 to $20 per visit     Co-Pay: visits per medical neccesity Home Health: 100%      Co-Pay: visits pert medical neccesity DME: 80%     Co-Pay: 20% Providers: in network  SECONDARY: none      Policy#:      Phone#:   Development worker, community:       Phone#:   The Engineer, petroleum" for patients in Inpatient Rehabilitation Facilities with attached "Privacy Act [place] Records" was provided and verbally reviewed with: Family  Emergency Contact Information Contact Information     Name Relation Home Work Mobile   Otis Daughter [phone]  [phone]   Velta, [place] [phone]     Hamza,Wilbert Spouse [phone]        Current Medical History  Patient Admitting Diagnosis: SDH  History of Present Illness: 66 year old female with history of A fib, MVR on chronic coumadin, CKD stage IIIa, Type 2 DM, chronic diastolic heart failure whtro presented to [facility] ER on 12/01/21 with complaints of bleeding gums. Recent teeth removal on 11/10. CT head was performed given the fact she was on Coumadin and revealed acute SDH with 8 mm left to right midline shift, Denied recent head trauma or falls.   She was given Kcentra  and transferred to [facility] on 11/23. Taken to OR on 11/24 for left frontotemporal craniotomy for evacuation of SDH. EVD removed on 11/25. On 11/27 patient transferred back to ICU for closer monitoring for 48 hours to resume heparin. Cardiology consulted due to MVR 2014. Continue Amio, Norvasc, Coreg, Avapro and Lipitor.   On 11/29 more aphasic and somnolent. Repeat CT scan and EEG.EEG with no seizures. To continue Keppra. Felt acute metabolic encephalopathy. CT scan stable. Neurosurgery felt stable. Began Coumadin on 11/30 with Heparin IV to bridge.  Complete NIHSS TOTAL: 13  Patient's medical record from [facility] has been reviewed by the rehabilitation admission coordinator and physician.  Past Medical History  Past Medical History:  Diagnosis Date   Abnormal chest CT    Anemia    Aortic atherosclerosis (HCC)    Asthma    Atrial enlargement, left    severe   Atrial fibrillation (HCC) 01/05/2011   Chronic persistent, failed DCCV    Atrial flutter (HCC)    Bronchospasm 1998   Cardiomegaly    Carotid bruit    Chronic diastolic congestive heart failure ([place])  Diabetes mellitus    Type II   Ejection fraction < 50%    35-40%,    Heart murmur    History of blood transfusion    History of radiation therapy 03/18/19-04/15/19   endometrial - vaginal brachytherapy -  Dr. Sondra Come    Hypercholesterolemia    Hypertension    Junctional rhythm 09/14/2018   Noted on EKG   Left bundle branch block (LBBB) 09/14/2018   Noted on EKG   LVH (left ventricular hypertrophy) 10/10/2016   Moderate, Noted on ECHO   Mitral regurgitation    Obesity    Obesity (BMI 30-39.9) 01/16/2012   Persistent atrial fibrillation ([place])    Polyp of rectum    Rheumatic fever 01/16/2012   Reported during childhood   S/P Maze operation for atrial fibrillation 02/15/2012   Complete biatrial lesion set using bipolar radiofrequency and cryothermy ablation with clipping of LA appendage   S/P mitral valve  replacement with metallic valve 45/80/9983   26m Sorin Carbomedics Optiform mechanical prosthesis   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   26mEdwards mc3 ring annuloplasty   Shortness of breath    with exertion   Sleep apnea    DOES NOT HAVE CPAP   [place]   Post op , left arm weakness   Tricuspid regurgitation 01/16/2012   [place]ine cancer [place]    Has the patient had major surgery during 100 days prior to admission? Yes  Family History   family history includes Asthma in her mother; Diabetes in her mother; Hypertension in her brother and mother.  Current Medications  Current Facility-Administered Medications:    acetaminophen (TYLENOL) tablet 650 mg, 650 mg, Oral, Q6H PRN, 650 mg at 12/09/21 1214 **OR** acetaminophen (TYLENOL) suppository 650 mg, 650 mg, Rectal, Q6H PRN, Dawley, Troy C, DO   amiodarone (PACERONE) tablet 200 mg, 200 mg, Oral, Daily, Mannam, Praveen, MD, 200 mg at 12/10/21 0846   amLODipine (NORVASC) tablet 10 mg, 10 mg, Oral, Daily, Mannam, Praveen, MD, 10 mg at 12/10/21 0846   atorvastatin (LIPITOR) tablet 40 mg, 40 mg, Oral, Daily, Dawley, Troy C, DO, 40 mg at 12/10/21 0846   carvedilol (COREG) tablet 12.5 mg, 12.5 mg, Oral, BID, Mannam, Praveen, MD, 12.5 mg at 12/10/21 0846   docusate sodium (COLACE) capsule 100 mg, 100 mg, Oral, BID, Dawley, Troy C, DO, 100 mg at 12/10/21 0846   heparin ADULT infusion 100 units/mL (25000 units/25033m 1,150 Units/hr, Intravenous, Continuous, Dang, Thuy D, RPH, Last Rate: 11.5 mL/hr at 12/09/21 1436, 1,150 Units/hr at 12/09/21 1436   hydrALAZINE (APRESOLINE) injection 10-40 mg, 10-40 mg, Intravenous, Q4H PRN, Desai, Rahul P, PA-C   HYDROcodone-acetaminophen (NORCO/VICODIN) 5-325 MG per tablet 1 tablet, 1 tablet, Oral, Q4H PRN, Dawley, Troy C, DO   insulin aspart (novoLOG) injection 0-15 Units, 0-15 Units, Subcutaneous, TID WC, Dawley, Troy C, DO, 2 Units at 12/10/21 [id number] aspart (novoLOG) injection 0-5 Units,  0-5 Units, Subcutaneous, QHS, Dawley, Troy C, DO, 3 Units at 12/03/21 2111   irbesartan (AVAPRO) tablet 300 mg, 300 mg, Oral, Daily, Dawley, Troy C, DO, 300 mg at 12/10/21 0846   labetalol (NORMODYNE) injection 10-40 mg, 10-40 mg, Intravenous, Q10 min PRN, Dawley, Troy C, DO   levETIRAcetam (KEPPRA) tablet 500 mg, 500 mg, Oral, BID, WilVentura SellersPH, 500 mg at 12/10/21 0846   morphine (PF) 2 MG/ML injection 1-2 mg, 1-2 mg, Intravenous, Q2H PRN, Dawley, Troy C, DO   ondansetron (ZOFRAN) tablet 4 mg,  4 mg, Oral, Q6H PRN **OR** ondansetron (ZOFRAN) injection 4 mg, 4 mg, Intravenous, Q6H PRN, Dawley, Troy C, DO   Oral care mouth rinse, 15 mL, Mouth Rinse, PRN, Laqueta Jean, MD   pantoprazole (PROTONIX) EC tablet 40 mg, 40 mg, Oral, Daily, Ventura Sellers, RPH, 40 mg at 12/10/21 0846   polyethylene glycol (MIRALAX / GLYCOLAX) packet 17 g, 17 g, Oral, Daily, Mick Sell, PA-C, 17 g at 12/10/21 0846   promethazine (PHENERGAN) tablet 12.5-25 mg, 12.5-25 mg, Oral, Q4H PRN, Dawley, Theodoro Doing, DO   Warfarin - Pharmacist Dosing Inpatient, , Does not apply, q1600, [name], RPH  Patients Current Diet:  Diet Order             Diet Carb Modified Fluid consistency: Thin; Room service appropriate? Yes with Assist  Diet effective now                  Precautions / Restrictions Precautions Precautions: Fall Precaution Comments: monitor O2, hovers around 89/90 on RA at rest Restrictions Weight Bearing Restrictions: No   Has the patient had 2 or more falls or a fall with injury in the past year? Yes  Prior Activity Level Community (5-7x/wk): Independent and driving  Prior Functional Level Self Care: Did the patient need help bathing, dressing, using the toilet or eating? Independent  Indoor Mobility: Did the patient need assistance with walking from room to room (with or without device)? Independent  Stairs: Did the patient need assistance with internal or external stairs  (with or without device)? Independent  Functional Cognition: Did the patient need help planning regular tasks such as shopping or remembering to take medications? Independent  Patient Information Are you of Hispanic, Latino/a,or Spanish origin?: A. No, not of Hispanic, Latino/a, or Spanish origin What is your race?: B. Black or African American Do you need or want an interpreter to communicate with a doctor or health care staff?: 9. Unable to respond  Patient's Response To:  Health Literacy and Transportation Is the patient able to respond to health literacy and transportation needs?: No Health Literacy - How often do you need to have someone help you when you read instructions, pamphlets, or other written material from your doctor or pharmacy?: Patient unable to respond In the past 12 months, has lack of transportation kept you from medical appointments or from getting medications?: No In the past 12 months, has lack of transportation kept you from meetings, work, or from getting things needed for daily living?: No  Development worker, international aid / [place] Devices/Equipment: None Home Equipment: Shower seat  Prior Device Use: Indicate devices/aids used by the patient prior to current illness, exacerbation or injury? None of the above  Current Functional Level Cognition  Overall Cognitive Status: Impaired/Different from baseline Difficult to assess due to: Impaired communication Current Attention Level: Focused Orientation Level: Other (comment) (UTA expressive aphasia) Following Commands: Follows one step commands with increased time, Follows one step commands inconsistently Safety/Judgement: Decreased awareness of safety, Decreased awareness of deficits General Comments: pt appears to have worsening expressive aphasia and word finding difficulty today in addition to receptive aphasia.pt able to follow all commands, only verbalizing yea and no throughout session    Extremity  Assessment (includes Sensation/Coordination)  Upper Extremity Assessment: Defer to OT evaluation RUE Deficits / Details: delayed in comparison to LUE for shoulder flexion  Lower Extremity Assessment: Generalized weakness    ADLs  Overall ADL's : Needs assistance/impaired Eating/Feeding Details (indicate cue  type and reason): Dtr reports yesterday she feed herself, today she had to feed her with the only thing the patient did was initiate holding cup to drink from and shook head yes/no to if she wanted what dtr was trying to feed her. Grooming: Oral care, Standing, Maximal assistance Grooming Details (indicate cue type and reason): max assist including setup of supplies, applying toothpaste, and completing steps for task Upper Body Bathing: Maximal assistance, Sitting Upper Body Bathing Details (indicate cue type and reason): EOB Lower Body Bathing: Maximal assistance Lower Body Bathing Details (indicate cue type and reason): Mod A sit<>stand Upper Body Dressing : Total assistance, Sitting Upper Body Dressing Details (indicate cue type and reason): EOB Lower Body Dressing: Moderate assistance Lower Body Dressing Details (indicate cue type and reason): Mod A sit<>stand Toilet Transfer: Minimal assistance, Ambulation, BSC/3in1 Toilet Transfer Details (indicate cue type and reason): hand held assist to the 3:1 over the toilet Toileting- Clothing Manipulation and Hygiene: Moderate assistance, Sit to/from stand Toileting - Clothing Manipulation Details (indicate cue type and reason): clothing management Functional mobility during ADLs: Minimal assistance (no assistive device) General ADL Comments: Pt demonstrates significant motor planning deficits when attempting to sequence through oral hygiene.  She needed hand over hand assist to apply toothpaste on the toothbrush secondary to not comprehending and instead continued to try and put the toothbrush in her mouth without it.  When toothbrush was  removed from her right hand in order to have her work on opening toothpaste, she instead tried to then put her finger in her mouth.    Mobility  Overal bed mobility: Needs Assistance Bed Mobility: Supine to Sit, Sit to Supine Supine to sit: Min assist Sit to supine: Min assist General bed mobility comments: min guard for safety light assist to manage LEs, pt with improved sequencing    Transfers  Overall transfer level: Needs assistance Equipment used: 1 person hand held assist Transfers: Sit to/from Stand Sit to Stand: Min assist General transfer comment: multimodal cues to initiate with anterior weight shift    Ambulation / Gait / Stairs / Wheelchair Mobility  Ambulation/Gait Ambulation/Gait assistance: Min assist, Counsellor (Feet): 150 Feet Assistive device: IV Pole Gait Pattern/deviations: Decreased step length - right, Decreased step length - left, Wide base of support, Drifts right/left General Gait Details: multimodal directional cues to navigate hallway.pt able to push IV pole with both hands and maintain close to center of hall without bumping into obstacels Gait velocity: slowed Gait velocity interpretation: <1.31 ft/sec, indicative of household ambulator    Posture / Balance Balance Overall balance assessment: Needs assistance Sitting-balance support: No upper extremity supported, Feet supported Sitting balance-Leahy Scale: Good Standing balance support: Bilateral upper extremity supported Standing balance-Leahy Scale: Poor Standing balance comment: dependent on external support    Special needs/care consideration Fall and seizure precautions   Previous Home Environment  Living Arrangements: Spouse/significant other, Children (and adult son)  Lives With: Son Available Help at Discharge: Family, Available 24 hours/day (daughter, Elwin Sleight can take FMLA) Type of Home: House Home Layout: One level Home Access: Stairs to enter [employer] of  Steps: 2 and 1 (no rails) Bathroom Shower/Tub: Tub/shower unit, Architectural technologist: Standard Bathroom Accessibility: Yes How Accessible: Accessible via walker Home Care Services: No Additional Comments: spouse currently leaves the house to take care of his mother  Discharge Living Setting Plans for Discharge Living Setting: Patient's home, Lives with (comment) (spouse and adult son) Type of Home at Discharge: [facility] Discharge  Home Layout: One level Discharge Home Access: Stairs to enter Entrance Stairs-Rails: None Entrance Stairs-Number of Steps: 2 and 1 Discharge Bathroom Shower/Tub: Tub/shower unit Discharge Bathroom Toilet: Standard Discharge Bathroom Accessibility: Yes How Accessible: Accessible via walker Does the patient have any problems obtaining your medications?: No  Social/Family/Support Systems Patient Roles: Parent Contact Information: spouse and daughter Anticipated Caregiver: spouse and daughter Anticipated Caregiver's Contact Information: see contacts Ability/Limitations of Caregiver: daughter can take FMLA Caregiver Availability: 24/7 Discharge Plan Discussed with Primary Caregiver: Yes Is Caregiver In Agreement with Plan?: Yes Does Caregiver/Family have Issues with Lodging/Transportation while Pt is in Rehab?: Yes  Goals Patient/Family Goal for Rehab: supervision to min assist with PT, OT and SLP Expected length of stay: ELOS 10 to 14 days Pt/Family Agrees to Admission and willing to participate: Yes Program Orientation Provided & Reviewed with Pt/Caregiver Including Roles  & Responsibilities: Yes  Decrease burden of Care through IP rehab admission: n/a  Possible need for SNF placement upon discharge: not anticipated  Patient Condition: I have reviewed medical records from [facility], spoken with  patient and daughter. I met with patient at the bedside for inpatient rehabilitation assessment.  Patient will benefit from ongoing PT, OT, and SLP,  can actively participate in 3 hours of therapy a day 5 days of the week, and can make measurable gains during the admission.  Patient will also benefit from the coordinated team approach during an Inpatient Acute Rehabilitation admission.  The patient will receive intensive therapy as well as Rehabilitation physician, nursing, social worker, and care management interventions.  Due to bladder management, bowel management, safety, skin/wound care, disease management, medication administration, pain management, and patient education the patient requires 24 hour a day rehabilitation nursing.  The patient is currently min to mod assist overall with mobility and basic ADLs.  Discharge setting and therapy post discharge at home with home health is anticipated.  Patient has agreed to participate in the Acute Inpatient Rehabilitation Program and will admit today.  Preadmission Screen Completed By:  Cleatrice Burke, 12/10/2021 10:48 AM ______________________________________________________________________   Discussed status with Dr. Naaman Plummer on 12/1 at 1053 and received approval for admission today.  Admission Coordinator:  Cleatrice Burke, RN, time 1324 Date 12/10/21   Assessment/Plan: Diagnosis: SDH Does the need for close, 24 hr/day Medical supervision in concert with the patient's rehab needs make it unreasonable for this patient to be served in a less intensive setting? Yes Co-Morbidities requiring supervision/potential complications: afib, MVR on a/c, dm, cdCHF Due to bladder management, bowel management, safety, skin/wound care, disease management, medication administration, pain management, and patient education, does the patient require 24 hr/day rehab nursing? Yes Does the patient require coordinated care of a physician, rehab nurse, PT, OT, and SLP to address physical and functional deficits in the context of the above medical diagnosis(es)? Yes Addressing deficits in the following areas:  balance, endurance, locomotion, strength, transferring, bowel/bladder control, bathing, dressing, feeding, grooming, toileting, cognition, speech, swallowing, and psychosocial support Can the patient actively participate in an intensive therapy program of at least 3 hrs of therapy 5 days a week? Yes The potential for patient to make measurable gains while on inpatient rehab is excellent Anticipated functional outcomes upon discharge from inpatient rehab: supervision and min assist PT, supervision and min assist OT, supervision and min assist SLP Estimated rehab length of stay to reach the above functional goals is: 10-14 days Anticipated discharge destination: Home 10. Overall Rehab/Functional Prognosis: excellent   MD Signature: Alroy Dust  Alen Blew, MD, [place] Director Rehabilitation Services 12/10/2021

## 2021-12-06 NOTE — Progress Notes (Signed)
   Providing Compassionate, Quality Care - Together  NEUROSURGERY PROGRESS NOTE   S: No issues overnight. Speech remains improved  O: EXAM:  BP (!) 131/53 (BP Location: Left Arm)   Pulse 86   Temp 98.9 F (37.2 C) (Oral)   Resp (!) 22   Ht '5\' 6"'$  (1.676 m)   Wt 106.1 kg   SpO2 94%   BMI 37.75 kg/m   Awake, alert, oriented  PERRL Speech is short but fluent, some intermittent word finding issues CNs grossly intact  5/5 BUE/BLE  Incision c/d/i  ASSESSMENT:  66 y.o. female with   Left mixed density subdural hematoma with mass effect Mechanical heart valve  -Status post left craniotomy for evacuation of hematoma, postoperative day 3  PLAN: -CT brain this morning reveals stable postoperative changes.  I do believe that she should undergo heparin drip however I do believe it would be better for her to be in the neuro ICU while beginning anticoagulation on a recent postoperative craniotomy -Recommend transfer back to the ICU for close neuro monitoring -Okay for heparin drip with no bolus with a PTT goal of 40-50 -Once therapeutic, will need repeat CT brain for evaluation of stability of hematoma before transition to oral anticoagulation    Thank you for allowing me to participate in this patient's care.  Please do not hesitate to call with questions or concerns.   Elwin Sleight, [facility] Cell: [phone]

## 2021-12-06 NOTE — Care Management Important Message (Signed)
Important Message  Patient Details  Name: [name] MRN: [id number] Date of Birth: October 23, 1955   Medicare Important Message Given:  Yes     Hannah Beat 12/06/2021, 1:07 PM

## 2021-12-06 NOTE — Progress Notes (Signed)
ANTICOAGULATION CONSULT NOTE - Initial Consult  Pharmacy Consult: Heparin Indication: Mechanical mitral valve, afib  Allergies  Allergen Reactions   Chlorhexidine Other (See Comments)    Unknown    Patient Measurements: Height: '5\' 6"'$  (167.6 cm) Weight: 106.1 kg (233 lb 14.5 oz) IBW/kg (Calculated) : 59.3 Heparin Dosing Weight: 83.7 kg  Vital Signs: Temp: 98.8 F (37.1 C) (11/27 1202) Temp Source: Oral (11/27 1202) BP: 110/59 (11/27 1201) Pulse Rate: 80 (11/27 1202)  Labs: Recent Labs    12/04/21 0424 12/05/21 0428 12/06/21 0423  HGB 8.9* 8.6* 7.9*  HCT 28.4* 28.0* 24.3*  PLT 178 174 140*  LABPROT 14.8 14.7 15.3*  INR 1.2 1.2 1.2  CREATININE 0.85 0.79 0.75     Estimated Creatinine Clearance: 84 mL/min (by C-G formula based on SCr of 0.75 mg/dL).   Medical History: Past Medical History:  Diagnosis Date   Abnormal chest CT    Anemia    Aortic atherosclerosis (HCC)    Asthma    Atrial enlargement, left    severe   Atrial fibrillation (HCC) 01/05/2011   Chronic persistent, failed DCCV    Atrial flutter (HCC)    Bronchospasm 1998   Cardiomegaly    Carotid bruit    Chronic diastolic congestive heart failure (HCC)    Diabetes mellitus    Type II   Ejection fraction < 50%    35-40%,    Heart murmur    History of blood transfusion    History of radiation therapy 03/18/19-04/15/19   endometrial - vaginal brachytherapy -  Dr. Sondra Come    Hypercholesterolemia    Hypertension    Junctional rhythm 09/14/2018   Noted on EKG   Left bundle branch block (LBBB) 09/14/2018   Noted on EKG   LVH (left ventricular hypertrophy) 10/10/2016   Moderate, Noted on ECHO   Mitral regurgitation    Obesity    Obesity (BMI 30-39.9) 01/16/2012   Persistent atrial fibrillation ([place])    Polyp of rectum    Rheumatic fever 01/16/2012   Reported during childhood   S/P Maze operation for atrial fibrillation 02/15/2012   Complete biatrial lesion set using bipolar radiofrequency  and cryothermy ablation with clipping of LA appendage   S/P mitral valve replacement with metallic valve 35/45/6256   106m Sorin [employer]omedics Optiform mechanical prosthesis   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   265mEdwards mc3 ring annuloplasty   Shortness of breath    with exertion   Sleep apnea    DOES NOT HAVE CPAP   Stroke ([facility]   Post op , left arm weakness   Tricuspid regurgitation 01/16/2012   [place]terine cancer ([facility]     Assessment: 675OF with history of mechanical MVR in 2014 and Afib on warfarin PTA (INR goal 2.5-3.5) admitted 11/23 with bleeding gums s/p teeth removal on 11/19/21. CT on admit also showed acute subdural hematoma with midline shift. She received KCentra, Vitamin K '10mg'$  and TXA on 11/23. S/p crani for evacuation on 12/03/21. INR down to 1.2 and stable today. Pharmacy now consulted to start IV heparin. Hemoglobin/hematocrit drifting down, platelet count also trended down (203 > 140); no overt bleeding reported. Per discussion with Dr. DaReatha Armourhe has updated his desired heparin infusion target level to 0.3-0.4 units/ml with no boluses (no aPTT monitoring required).  Goal of Therapy:  Heparin level 0.3-0.4 units/ml (no boluses) per Neurosurgery (Dawley) Monitor platelets by anticoagulation protocol: Yes   Plan:  Initiate IV heparin at 1000 units/hr -  no bolus Check 6 hr heparin level Monitor daily CBC, s/sx bleeding F/u resume warfarin as appropriate   Arturo Morton, PharmD, BCPS Please check AMION for all [place] contact numbers Clinical Pharmacist 12/06/2021 4:05 PM

## 2021-12-07 ENCOUNTER — Inpatient Hospital Stay (HOSPITAL_COMMUNITY): Payer: Medicare HMO

## 2021-12-07 ENCOUNTER — Encounter (HOSPITAL_COMMUNITY): Payer: Self-pay | Admitting: Neurological Surgery

## 2021-12-07 DIAGNOSIS — S065XAA Traumatic subdural hemorrhage with loss of consciousness status unknown, initial encounter: Secondary | ICD-10-CM | POA: Diagnosis not present

## 2021-12-07 DIAGNOSIS — Z7901 Long term (current) use of anticoagulants: Secondary | ICD-10-CM

## 2021-12-07 LAB — BASIC METABOLIC PANEL
Anion gap: 10 (ref 5–15)
BUN: 15 mg/dL (ref 8–23)
CO2: 26 mmol/L (ref 22–32)
Calcium: 8.6 mg/dL — ABNORMAL LOW (ref 8.9–10.3)
Chloride: 104 mmol/L (ref 98–111)
Creatinine, Ser: 0.88 mg/dL (ref 0.44–1.00)
GFR, Estimated: >60 mL/min (ref >60)
Glucose, Bld: 156 mg/dL — ABNORMAL HIGH (ref 70–99)
Potassium: 4.1 mmol/L (ref 3.5–5.1)
Sodium: 140 mmol/L (ref 135–145)

## 2021-12-07 LAB — HEPARIN LEVEL (UNFRACTIONATED)
Heparin Unfractionated: 0.11 IU/mL — ABNORMAL LOW (ref 0.30–0.70)
Heparin Unfractionated: 0.19 IU/mL — ABNORMAL LOW (ref 0.30–0.70)
Heparin Unfractionated: 0.36 IU/mL (ref 0.30–0.70)

## 2021-12-07 LAB — CBC WITH DIFFERENTIAL/PLATELET
Abs Immature Granulocytes: 0.02 10*3/uL (ref 0.00–0.07)
Basophils Absolute: 0 10*3/uL (ref 0.0–0.1)
Basophils Relative: 1 %
Eosinophils Absolute: 0.1 10*3/uL (ref 0.0–0.5)
Eosinophils Relative: 2 %
HCT: 24.9 % — ABNORMAL LOW (ref 36.0–46.0)
Hemoglobin: 8 g/dL — ABNORMAL LOW (ref 12.0–15.0)
Immature Granulocytes: 0 %
Lymphocytes Relative: 22 %
Lymphs Abs: 1.4 10*3/uL (ref 0.7–4.0)
MCH: 32.3 pg (ref 26.0–34.0)
MCHC: 32.1 g/dL (ref 30.0–36.0)
MCV: 100.4 fL — ABNORMAL HIGH (ref 80.0–100.0)
Monocytes Absolute: 0.5 10*3/uL (ref 0.1–1.0)
Monocytes Relative: 7 %
Neutro Abs: 4.4 10*3/uL (ref 1.7–7.7)
Neutrophils Relative %: 68 %
Platelets: 147 10*3/uL — ABNORMAL LOW (ref 150–400)
RBC: 2.48 MIL/uL — ABNORMAL LOW (ref 3.87–5.11)
RDW: 12.8 % (ref 11.5–15.5)
WBC: 6.4 10*3/uL (ref 4.0–10.5)
nRBC: 0 % (ref 0.0–0.2)

## 2021-12-07 LAB — MAGNESIUM: Magnesium: 1.9 mg/dL (ref 1.7–2.4)

## 2021-12-07 LAB — GLUCOSE, CAPILLARY
Glucose-Capillary: 221 mg/dL — ABNORMAL HIGH (ref 70–99)
Glucose-Capillary: 205 mg/dL — ABNORMAL HIGH (ref 70–99)
Glucose-Capillary: 138 mg/dL — ABNORMAL HIGH (ref 70–99)
Glucose-Capillary: 150 mg/dL — ABNORMAL HIGH (ref 70–99)
Glucose-Capillary: 148 mg/dL — ABNORMAL HIGH (ref 70–99)

## 2021-12-07 NOTE — Progress Notes (Signed)
Patient has increased receptive aphasia, in addition to the intermittent expressive aphasia from earlier. Discussed with Dr. Gwen Her. Since patient is close to therapeutic levels of heparin gtt, he gave instructions to obtain Head CT. Patient transported to and from CT with no complications.

## 2021-12-07 NOTE — Progress Notes (Signed)
Inpatient Rehabilitation Admissions Coordinator   I await [facility] medicare approval and medical clearance to admit to CIR. I met at bedside with patient and spouse and they are aware and in agreement. Estimated cost of care reviewed with spouse.  Danne Baxter, RN, MSN Rehab Admissions Coordinator [phone] 12/07/2021 2:58 PM

## 2021-12-07 NOTE — Progress Notes (Signed)
Inpatient Rehabilitation Admissions Coordinator   I have received insurance approval for CIR admit when patient medically ready.  Danne Baxter, RN, MSN Rehab Admissions Coordinator [phone] 12/07/2021 7:20 PM

## 2021-12-07 NOTE — Progress Notes (Signed)
Physical Therapy Treatment Patient Details Name: [name] MRN: [id number] DOB: 1955-04-02 Today's Date: 12/07/2021   History of Present Illness 66 year old African-American female presents to the ER  with chief complaint of bleeding gums. Patient had several teeth removed by oral surgery on 11/19/2021. 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 11/24 Left frontotemporal craniotomy for evacuation of subdural hematoma, lysis of membranes; history of A-fib, history of mechanical mitral valve placement on chronic Coumadin, CKD stage IIIa, type 2 diabetes, chronic diastolic heart failure.    PT Comments    Pt progressing towards all goals. Pt with improved attention to L side today and demonstrated better walker management during ambulation. Pt continues to have delayed processing, decreased insight to deficits and safety, impaired balance, vears to the R during ambulation, and has word finding difficulties. Pt to continue to benefit from AIR Upon d/c to address functional and cognitive deficits to decrease burden of care on spouse and progress towards supervision level of function. Acute PT to cont to follow.    Recommendations for follow up therapy are one component of a multi-disciplinary discharge planning process, led by the attending physician.  Recommendations may be updated based on patient status, additional functional criteria and insurance authorization.  Follow Up Recommendations  Acute inpatient rehab (3hours/day)     Assistance Recommended at Discharge Frequent or constant Supervision/Assistance  Patient can return home with the following A lot of help with walking and/or transfers;A lot of help with bathing/dressing/bathroom   Equipment Recommendations  Rolling walker (2 wheels);BSC/3in1    Recommendations for Other Services OT  consult;Speech consult;Rehab consult     Precautions / Restrictions Precautions Precautions: Fall Precaution Comments: monitor O2, hovers around 89/90 on RA at rest Restrictions Weight Bearing Restrictions: No     Mobility  Bed Mobility Overal bed mobility: Needs Assistance Bed Mobility: Supine to Sit     Supine to sit: Min assist     General bed mobility comments: max verbal and tactile directional cues, step by step to complete task, minA for safety, increased time    Transfers Overall transfer level: Needs assistance Equipment used: Rolling walker (2 wheels) Transfers: Sit to/from Stand Sit to Stand: Min assist           General transfer comment: multimodal cues to initiate with anterior weight shift, verbal cues for safe hand placement    Ambulation/Gait Ambulation/Gait assistance: Min assist, +2 safety/equipment (chair push) Gait Distance (Feet): 150 Feet Assistive device: Rolling walker (2 wheels) Gait Pattern/deviations: Decreased step length - right, Decreased step length - left, Wide base of support Gait velocity: slowed Gait velocity interpretation: <1.31 ft/sec, indicative of household ambulator   General Gait Details: multimodal directional cues, pt with improved walker management this date, continues to vear to the R, max directional verbal cues to navigate hallway   Stairs             Wheelchair Mobility    Modified Rankin (Stroke Patients Only) Modified Rankin (Stroke Patients Only) Pre-Morbid Rankin Score: No significant disability Modified Rankin: Moderately severe disability     Balance Overall balance assessment: Needs assistance Sitting-balance support: No upper extremity supported, Feet supported Sitting balance-Leahy Scale: Good     Standing balance support: Bilateral upper extremity supported Standing balance-Leahy Scale: Poor Standing balance comment: dependent on external support  Cognition Arousal/Alertness: Awake/alert Behavior During Therapy: Flat affect Overall Cognitive Status: Impaired/Different from baseline Area of Impairment: Following commands, Safety/judgement, Awareness, Problem solving, Attention, Memory, Orientation                 Orientation Level: Disoriented to (slow to respond, states only first name when asked name, verbal cues for last name, unable to state hospital but when asked if she was in a restaurant she responded no, then responded yes to hospital. Pt continues with word finding difficulties) Current Attention Level: Focused Memory: Decreased short-term memory Following Commands: Follows one step commands with increased time Safety/Judgement: Decreased awareness of safety, Decreased awareness of deficits Awareness: Intellectual Problem Solving: Slow processing, Decreased initiation, Difficulty sequencing, Requires verbal cues, Requires tactile cues General Comments: pt with improved attention to L side today, remains to have word finding difficulties, difficulty with L/R discrepency, and following multistep commands, especially directions to the L when navigating hallway        Exercises      General Comments General comments (skin integrity, edema, etc.): SpO2 drops to 86% on RA, 90% on 1LO2 via [place], RR at 38 during ambulation, pt denies SOB      Pertinent Vitals/Pain Pain Assessment Pain Assessment: No/denies pain    Home Living                          Prior Function            PT Goals (current goals can now be found in the care plan section) Acute Rehab PT Goals PT Goal Formulation: With patient/family Time For Goal Achievement: 12/19/21 Potential to Achieve Goals: Good Progress towards PT goals: Progressing toward goals    Frequency    Min 4X/week      PT Plan Current plan remains appropriate    Co-evaluation              AM-PAC PT "6 Clicks" Mobility   Outcome Measure  Help  needed turning from your back to your side while in a flat bed without using bedrails?: A Little Help needed moving from lying on your back to sitting on the side of a flat bed without using bedrails?: A Lot Help needed moving to and from a bed to a chair (including a wheelchair)?: A Lot Help needed standing up from a chair using your arms (e.g., wheelchair or bedside chair)?: A Lot Help needed to walk in hospital room?: A Lot Help needed climbing 3-5 steps with a railing? : Total 6 Click Score: 12    End of Session Equipment Utilized During Treatment: Gait belt Activity Tolerance: Patient tolerated treatment well Patient left: in chair;with call bell/phone within reach;with chair alarm set;with family/visitor present Nurse Communication: Mobility status PT Visit Diagnosis: Unsteadiness on feet (R26.81);Other abnormalities of gait and mobility (R26.89);Muscle weakness (generalized) (M62.81);Other symptoms and signs involving the nervous system (R29.898)     Time: 5573-2202 PT Time Calculation (min) (ACUTE ONLY): 29 min  Charges:  $Gait Training: 23-37 mins                     Kittie Plater, PT, DPT Acute Rehabilitation Services Secure chat preferred Office #: [phone]    Berline Lopes 12/07/2021, 11:15 AM

## 2021-12-07 NOTE — Progress Notes (Signed)
Speech Language Pathology Treatment: Cognitive-Linquistic  Patient Details Name: [name] MRN: [id number] DOB: 1955/11/26 Today's Date: 12/07/2021 Time: 7741-4239 SLP Time Calculation (min) (ACUTE ONLY): 17 min  Assessment / Plan / Recommendation Clinical Impression  Pt seen for aphasia intervention with husband at bedside who reports increase in verbal output over past several days. She is now able to respond to questions at the word and short phrase level with hesitations, semantic and phonemic paraphasias and RN reported perseverations this morning. She followed one step commands with 90% accuracy, and 2 step with 80%. Automatic utterances with counting and days of the week 100% accurate independently. During picture description she stated mostly nouns omitting articles and verbs with difficulty repeating at times. Named common objects with 33% independently and phonemic cues were inconsistently helpful. Pt responded to biographical questions with 100% accuracy (self correction x 1) re: number, names and ages of grandchildren. Education was provided to pt/husband re: nature of aphasia and ways to help cue her when intended target is known using cueing hierarchy. Continue intervention in acute care and she is a good candidate for inpatient rehab from an ST perspective.    HPI HPI: Pt is a 66 year old female presented to ED 11/23 with bleeding gums after dental extraction 11/10. CT head 11/23: Bilateral mixed density subdural collections, most likely acute on chronic hemorrhage, measuring up to 16 mm on the left and 6 mm on the right, with 8 mm left to right midline shift and effacement of the left-greater-than-right sulci. Pt s/p left frontotemporal craniotomy for evacuation of SDH, lysis, of membranes 11/24.  PMH: A-fib, history of mechanical mitral valve placement on chronic Coumadin, CKD stage IIIa, type 2 diabetes, chronic diastolic heart failure presents to the ER today with chief complaint of  bleeding gums.  Patient had several teeth removed by oral surgery on 11/19/2021.      SLP Plan  Continue with current plan of care      Recommendations for follow up therapy are one component of a multi-disciplinary discharge planning process, led by the attending physician.  Recommendations may be updated based on patient status, additional functional criteria and insurance authorization.    Recommendations                   Follow Up Recommendations: Acute inpatient rehab (3hours/day) SLP Visit Diagnosis: Aphasia (R47.01) Plan: Continue with current plan of care           [place] Siren  12/07/2021, 11:18 AM

## 2021-12-07 NOTE — Progress Notes (Signed)
ANTICOAGULATION CONSULT NOTE  Pharmacy Consult:  Heparin Indication: Mechanical mitral valve  Allergies  Allergen Reactions   Chlorhexidine Other (See Comments)    Unknown    Patient Measurements: Height: '5\' 6"'$  (167.6 cm) Weight: 94.9 kg (209 lb 3.5 oz) IBW/kg (Calculated) : 59.3 Heparin Dosing Weight: 84 kg  Vital Signs: Temp: 99.1 F (37.3 C) (11/28 1200) Temp Source: Oral (11/28 1200) BP: 120/56 (11/28 1510) Pulse Rate: 66 (11/28 1546)  Labs: Recent Labs    12/05/21 0428 12/06/21 0423 12/07/21 0054 12/07/21 0705 12/07/21 1537  HGB 8.6* 7.9* 8.0*  --   --   HCT 28.0* 24.3* 24.9*  --   --   PLT 174 140* 147*  --   --   LABPROT 14.7 15.3*  --   --   --   INR 1.2 1.2  --   --   --   HEPARINUNFRC  --   --  0.11* 0.19* 0.36  CREATININE 0.79 0.75 0.88  --   --      Estimated Creatinine Clearance: 72 mL/min (by C-G formula based on SCr of 0.88 mg/dL).  Assessment: 10 YOF with history of mechanical MVR in 2014 and Afib on Coumadin PTA admitted 11/23 with bleeding gums s/p teeth removal on 11/19/21.  CT on admit also showed acute subdural hematoma with midline shift.  She received KCentra, Vitamin K '10mg'$  and TXA on 11/23.  S/p crani for evacuation on 12/03/21.  Pharmacy consulted to dose IV heparin.   Heparin level came back therapeutic this PM at 0.36. We will check a confirm in AM.   Goal of Therapy:  Heparin level 0.3-0.4 units/ml per NSGY Monitor platelets by anticoagulation protocol: Yes   Plan:  Cont IV heparin 1200 units/hr - no bolus Check AM heparin level Daily heparin level and CBC  Onnie Boer, PharmD, BCIDP, AAHIVP, CPP Infectious Disease Pharmacist 12/07/2021 4:30 PM

## 2021-12-07 NOTE — Progress Notes (Incomplete)
Progress Note  Patient Name: ZOEYA GRAMAJO Date of Encounter: 12/07/2021  Primary Cardiologist: Larae Grooms, MD  HPI:   Patient status post left frontotemporal craniotomy for management of SDH on November 24, neurosurgery following. Seen today with daughter at bedside. Feeling well without CP or SOB. Hx of mechanical MVR.  Inpatient Medications    Scheduled Meds:  amiodarone  200 mg Oral Daily   amLODipine  10 mg Oral Daily   atorvastatin  40 mg Oral Daily   carvedilol  12.5 mg Oral BID   docusate sodium  100 mg Oral BID   insulin aspart  0-15 Units Subcutaneous TID WC   insulin aspart  0-5 Units Subcutaneous QHS   irbesartan  300 mg Oral Daily   levETIRAcetam  500 mg Oral BID   pantoprazole  40 mg Oral Daily   polyethylene glycol  17 g Oral Daily   Continuous Infusions:  heparin 1,000 Units/hr (12/07/21 0700)    PRN Meds: acetaminophen **OR** acetaminophen, hydrALAZINE, HYDROcodone-acetaminophen, labetalol, morphine injection, ondansetron **OR** ondansetron (ZOFRAN) IV, promethazine   Vital Signs    Vitals:   12/07/21 0700 12/07/21 0800 12/07/21 0831 12/07/21 0832  BP: (!) 115/57  (!) 130/58   Pulse: 66 84 80 77  Resp: 18 (!) 31 (!) 24 (!) 24  Temp:  98.7 F (37.1 C)    TempSrc:  Oral    SpO2: 96% (!) 87% (!) 87% 95%  Weight:      Height:        Intake/Output Summary (Last 24 hours) at 12/07/2021 0919 Last data filed at 12/07/2021 0700 Gross per 24 hour  Intake 365.01 ml  Output 680 ml  Net -314.99 ml   Filed Weights   12/05/21 0443 12/06/21 0307 12/07/21 0500  Weight: 101.8 kg 106.1 kg 94.9 kg    Telemetry    Sinus rhythm.  Personally reviewed.  ECG    An ECG dated 12/02/2021 was personally reviewed today and demonstrated:  Sinus rhythm with left bundle branch block (old).  Exam:   GEN: No acute distress.   Cardiac: RRR, soft systolic murmur RSB. Mechanical valve closure sound at S1.  Respiratory: no increased work of breathing. GI:  Soft, nontender, non-distended  MS: No edema; No deformity. Psych: Normal affect   Labs    Chemistry Recent Labs  Lab 12/02/21 0148 12/02/21 0945 12/03/21 0238 12/05/21 0428 12/06/21 0423 12/07/21 0054  NA 139 139   < > 141 140 140  K 4.1 3.7   < > 3.9 3.8 4.1  CL 105 106   < > 109 108 104  CO2 24 26   < > '27 27 26  '$ GLUCOSE 108* 96   < > 119* 129* 156*  BUN 18 15   < > '12 12 15  '$ CREATININE 0.96 0.78   < > 0.79 0.75 0.88  CALCIUM 9.1 9.0   < > 8.3* 8.3* 8.6*  PROT 8.2* 8.2*  --   --   --   --   ALBUMIN 3.9 3.8  --   --   --   --   AST 35 32  --   --   --   --   ALT 24 25  --   --   --   --   ALKPHOS 71 69  --   --   --   --   BILITOT 0.9 1.2  --   --   --   --   GFRNONAA >  60 >60   < > >60 >60 >60  ANIONGAP 10 7   < > '5 5 10   '$ < > = values in this interval not displayed.     Hematology Recent Labs  Lab 12/05/21 0428 12/06/21 0423 12/07/21 0054  WBC 8.2 6.8 6.4  RBC 2.73* 2.43* 2.48*  HGB 8.6* 7.9* 8.0*  HCT 28.0* 24.3* 24.9*  MCV 102.6* 100.0 100.4*  MCH 31.5 32.5 32.3  MCHC 30.7 32.5 32.1  RDW 13.4 13.0 12.8  PLT 174 140* 147*    Radiology    CT HEAD WO CONTRAST (5MM)  Result Date: 12/06/2021 CLINICAL DATA:  66 year old female with bilateral mixed density subdural hematomas postoperative day 3 left frontotemporal craniotomy, lysis of membranes and evacuation of hematoma. EXAM: CT HEAD WITHOUT CONTRAST TECHNIQUE: Contiguous axial images were obtained from the base of the skull through the vertex without intravenous contrast. RADIATION DOSE REDUCTION: This exam was performed according to the departmental dose-optimization program which includes automated exposure control, adjustment of the mA and/or kV according to patient size and/or use of iterative reconstruction technique. COMPARISON:  12/04/2021 and earlier. FINDINGS: Brain: Percutaneous left subdural drain has been removed since 12/04/2021. Mixed density left side subdural hematoma is stable since  12/04/2021, with a focal lobulated component up to 9 mm in thickness, but is mostly 7-8 mm thickness elsewhere. Small volume residual pneumocephalus along the anterior left frontal convexity. Smaller mixed density right side subdural measuring up to 4-5 mm in thickness is stable. No new areas of intracranial hemorrhage identified. Stable trace rightward midline shift. Stable mild mass effect on the lateral ventricles. No ventriculomegaly. Stable gray-white matter differentiation throughout the brain. Basilar cisterns remain normal. No cortically based acute infarct identified. Vascular: Calcified atherosclerosis at the skull base. Skull: Stable recent left frontotemporal craniotomy. Sinuses/Orbits: Visualized paranasal sinuses and mastoids are stable and well aerated. Other: Percutaneous left subdural drain has been removed. Postoperative changes to the scalp, skin staples remain in place. Orbits appear negative. IMPRESSION: 1. Left subdural drain removed. Unchanged mixed density Left > Right SDH since 12/04/2021. Residual hematoma up to 9 mm on the left and 5 mm on the right. 2. Stable mild intracranial mass effect with trace rightward midline shift. 3. No new intracranial abnormality. Electronically Signed   By: Genevie Ann M.D.   On: 12/06/2021 05:24    Assessment & Plan    1.  History of mitral stenosis with mechanical MVR 2014 on chronic Coumadin.  Anticoagulation initially held and received Kcentra at presentation. IV heparin started, in ICU. If no bleeding concerns, NSGY note suggests transfer from ICU may be permissible 11/29. Resumption of warfarin per NSGY, continue IV heparin. ***  2.  Paroxysmal atrial fibrillation.  Currently in sinus rhythm with old left bundle branch block by ECG.  Continue Coreg and amiodarone. Continue telemetry.  Signed, Elouise Munroe, MD  12/07/2021, 9:19 AM

## 2021-12-07 NOTE — Progress Notes (Signed)
NAME:  ATIANA LEVIER, MRN:  [id number], DOB:  30-Dec-1955, LOS: 5 ADMISSION DATE:  12/01/2021, CONSULTATION DATE:  12/03/21 REFERRING MD:  [place] CHIEF COMPLAINT:  AMS   History of Present Illness:  NATALIN BIBLE is a 66 y.o. female who has a PMH of A.fib and mechanical MVR in 2014 on chronic Coumadin, CKD, DM. She presented to ED 11/23 with bleeding gums after dental extraction 11/10. As part of her workup, she had CT head which demonstrated acute on chronic bilateral DH with 13m L to R MLS. [facility] given Kcentra in ED [facility].  She was taken to OR 11/24 for left frontotemporal craniotomy for evacuation of SDH, lysis, of membranes.  Post operatively, she returned to the ICU and PCCM asked to assist with her care while in ICU  Pertinent  Medical History:  has Hypertension; Type 2 diabetes mellitus ([place]; Hypercholesterolemia; Mediastinal lymphadenopathy; Asthma; Atrial fibrillation ([place]; Chronic systolic dysfunction of left ventricle; Snoring; Chronic diastolic CHF (congestive heart failure) ([place]; Mitral regurgitation; Tricuspid regurgitation; Rheumatic fever; Obesity (BMI 30-39.9); CHF (congestive heart failure) ([place]; MR (mitral regurgitation); History of mitral valve replacement with mechanical valve; S/P tricuspid valve repair; S/P Maze operation for atrial fibrillation; S/P TVR (tricuspid valve repair); S/P MVR (mitral valve replacement); CVA (cerebral infarction); Atrial flutter ([place]; Mitral valve disorders(424.0); Heart valve replaced by other means; Encounter for therapeutic drug monitoring; LBBB (left bundle branch block); Special screening for malignant neoplasms, colon; Uterine cancer ([place]; Endometrial cancer ([place]; Iron deficiency anemia; Stage 3a chronic kidney disease (CKD) ([place]; Drug-induced hyperglycemia; Anemia, chronic disease; Pancytopenia, acquired ([place]; Peripheral neuropathy due to chemotherapy (HE[place]; Acute subdural  hematoma ([place]; Chronic anticoagulation - on coumadin for mechanical mitral valve, afib; and Subdural hematoma ([place] on their problem list.  Significant Hospital Events: Including procedures, antibiotic start and stop dates in addition to other pertinent events   11/23 admit 11/24 to OR for evacuation of SDH 11/25 Repeat CT is stable. EVD taken out. Transfer to progressive 11/27 NSG wanting patient transferred to ICU for closer monitoring for 48 hours to resume heparin  Interim History / Subjective:  Up in chair No neurologic change  Objective:  Blood pressure (!) 130/58, pulse 77, temperature 98.7 F (37.1 C), temperature source Oral, resp. rate (!) 24, height '5\' 6"'$  (1.676 m), weight 94.9 kg, SpO2 95 %.        Intake/Output Summary (Last 24 hours) at 12/07/2021 0902 Last data filed at 12/07/2021 0700 Gross per 24 hour  Intake 365.01 ml  Output 680 ml  Net -314.99 ml    Filed Weights   12/05/21 0443 12/06/21 0307 12/07/21 0500  Weight: 101.8 kg 106.1 kg 94.9 kg    Examination: General:  NAD HEENT: MM pink/moist; EVD in place Neuro: Aox3; MAE; b/l muscle strength UE/LE CV: s1s2, RRR, no m/r/g PULM:  dim clear BS bilaterally GI: soft, bsx4 active  Extremities: warm/dry, no edema  Skin: no rashes or lesions appreciated  Labs/imaging personally reviewed:  Labs are stable No new imaging  Assessment & Plan:   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 Hx CVA P: -Per NSG -continue keppra -heparin gtt started 11/27 ; transfer out late evening 11/29 to progressive if no neurologic change -frequent neuro checks -further imaging per nsg  Hx. A.fib, Mitral stenosis s/p MVR 2014 on chronic coumadin, dCHF (Echo from EF 60-65%), HTN, HLD. P: -cards following; appreciate recs -cont heparin gtt -continue  amio -continue home Norvasc, Coreg, Avapro and Lipitor  Hx DM P: - SSI and cbg monitoring  Anemia P: -trend cbc  Best practice (evaluated daily):   Diet/type: NPO - advance as tolerated DVT prophylaxis: systemic heparin GI prophylaxis: N/A Lines: N/A Foley:  N/A Code Status:  full code Last date of multidisciplinary goals of care discussion: 11/28 spoke with patient and husband at bedside  Critical care time: NA   JD Rexene Agent [facility] 12/07/2021, 9:02 AM  Please see [email or web address] for pager details.  From 7A-7P if no response, please call [phone]. After hours, please call ELink [phone].

## 2021-12-08 ENCOUNTER — Inpatient Hospital Stay (HOSPITAL_COMMUNITY): Payer: Medicare HMO

## 2021-12-08 DIAGNOSIS — I152 Hypertension secondary to endocrine disorders: Secondary | ICD-10-CM

## 2021-12-08 DIAGNOSIS — R569 Unspecified convulsions: Secondary | ICD-10-CM

## 2021-12-08 DIAGNOSIS — E1159 Type 2 diabetes mellitus with other circulatory complications: Secondary | ICD-10-CM

## 2021-12-08 DIAGNOSIS — S065XAA Traumatic subdural hemorrhage with loss of consciousness status unknown, initial encounter: Secondary | ICD-10-CM | POA: Diagnosis not present

## 2021-12-08 LAB — GLUCOSE, CAPILLARY
Glucose-Capillary: 146 mg/dL — ABNORMAL HIGH (ref 70–99)
Glucose-Capillary: 172 mg/dL — ABNORMAL HIGH (ref 70–99)
Glucose-Capillary: 118 mg/dL — ABNORMAL HIGH (ref 70–99)
Glucose-Capillary: 158 mg/dL — ABNORMAL HIGH (ref 70–99)

## 2021-12-08 LAB — CBC
HCT: 26.3 % — ABNORMAL LOW (ref 36.0–46.0)
Hemoglobin: 8.5 g/dL — ABNORMAL LOW (ref 12.0–15.0)
MCH: 32.3 pg (ref 26.0–34.0)
MCHC: 32.3 g/dL (ref 30.0–36.0)
MCV: 100 fL (ref 80.0–100.0)
Platelets: 159 10*3/uL (ref 150–400)
RBC: 2.63 MIL/uL — ABNORMAL LOW (ref 3.87–5.11)
RDW: 12.7 % (ref 11.5–15.5)
WBC: 6.3 10*3/uL (ref 4.0–10.5)
nRBC: 0 % (ref 0.0–0.2)

## 2021-12-08 LAB — HEPARIN LEVEL (UNFRACTIONATED)
Heparin Unfractionated: 0.41 IU/mL (ref 0.30–0.70)
Heparin Unfractionated: 0.45 IU/mL (ref 0.30–0.70)
Heparin Unfractionated: 0.4 IU/mL (ref 0.30–0.70)

## 2021-12-08 MED ORDER — ORAL CARE MOUTH RINSE: 15.0000 mL | OROMUCOSAL | Status: DC | PRN

## 2021-12-08 NOTE — Progress Notes (Addendum)
ANTICOAGULATION CONSULT NOTE  Pharmacy Consult:  Heparin Indication: Mechanical mitral valve  Allergies  Allergen Reactions   Chlorhexidine Other (See Comments)    Unknown    Patient Measurements: Height: '5\' 6"'$  (167.6 cm) Weight: 95 kg (209 lb 7 oz) IBW/kg (Calculated) : 59.3 Heparin Dosing Weight: 84 kg  Vital Signs: Temp: 98.6 F (37 C) (11/29 0400) Temp Source: Oral (11/29 0400) BP: 120/61 (11/29 0700) Pulse Rate: 68 (11/29 0725)  Labs: Recent Labs    12/06/21 0423 12/06/21 0423 12/07/21 0054 12/07/21 0705 12/07/21 1537 12/08/21 0556  HGB 7.9*  --  8.0*  --   --  8.5*  HCT 24.3*  --  24.9*  --   --  26.3*  PLT 140*  --  147*  --   --  159  LABPROT 15.3*  --   --   --   --   --   INR 1.2  --   --   --   --   --   HEPARINUNFRC  --    < > 0.11* 0.19* 0.36 0.41  CREATININE 0.75  --  0.88  --   --   --    < > = values in this interval not displayed.     Estimated Creatinine Clearance: 72.1 mL/min (by C-G formula based on SCr of 0.88 mg/dL).  Assessment: 65 YOF with history of mechanical MVR in 2014 and Afib on Coumadin PTA admitted 11/23 with bleeding gums s/p teeth removal on 11/19/21.  CT on admit also showed acute subdural hematoma with midline shift.  She received KCentra, Vitamin K '10mg'$  and TXA on 11/23.  S/p crani for evacuation on 12/03/21.  Pharmacy consulted to dose IV heparin.   Heparin level therapeutic; no issue reported.  CBC stable.  Goal of Therapy:  Heparin level 0.3-0.4 units/ml per NSGY, no bolus Monitor platelets by anticoagulation protocol: Yes   Plan:  Continue IV heparin at 1200 units/hr Repeat heparin level to make sure patient will not accumulate, given strict goal Daily heparin level and CBC  Rethel Sebek D. Mina Marble, PharmD, BCPS, [place] 12/08/2021, 7:52 AM   ======================  Addendum: Heparin level above goal at 0.45 units/mL Reduce heparin infusion to 1150 units/hr Repeat heparin level  Joffrey Kerce D. Mina Marble, PharmD, BCPS,  [place] 12/08/2021, 2:00 PM

## 2021-12-08 NOTE — Progress Notes (Addendum)
NAME:  [name], MRN:  [id number], DOB:  12-28-1955, LOS: 6 ADMISSION DATE:  12/01/2021, CONSULTATION DATE:  12/03/21 REFERRING MD:  Dawley CHIEF COMPLAINT:  AMS   History of Present Illness:  [name] is a 66 y.o. female who has a PMH of A.fib and mechanical MVR in 2014 on chronic Coumadin, CKD, DM. She presented to ED 11/23 with bleeding gums after dental extraction 11/10. As part of her workup, she had CT head which demonstrated acute on chronic bilateral DH with 13m L to R MLS. [facility] given Kcentra [facility].  She was taken to OR 11/24 for left frontotemporal craniotomy for evacuation of SDH, lysis, of membranes.  Post operatively, she returned to the ICU and PCCM asked to assist with her care while in ICU  Pertinent  Medical History:  has Hypertension; Type 2 diabetes mellitus ([place] [place]; Hypercholesterolemia; Mediastinal lymphadenopathy; Asthma; Atrial fibrillation ([place]; Chronic systolic dysfunction of left ventricle; Snoring; Chronic diastolic CHF (congestive heart failure) ([place]; Mitral regurgitation; Tricuspid regurgitation; Rheumatic fever; Obesity (BMI 30-39.9); CHF (congestive heart failure) ([facility]; MR (mitral regurgitation); History of mitral valve replacement with mechanical valve; S/P tricuspid valve repair; S/P Maze operation for atrial fibrillation; S/P TVR (tricuspid valve repair); S/P MVR (mitral valve replacement); CVA (cerebral infarction); Atrial flutter ([place]; Mitral valve disorders(424.0); Heart valve replaced by other means; Encounter for therapeutic drug monitoring; LBBB (left bundle branch block); Special screening for malignant neoplasms, colon; Uterine cancer ([place]; Endometrial cancer ([facility]; Iron deficiency anemia; Stage 3a chronic kidney disease (CKD) ([facility]; Drug-induced hyperglycemia; Anemia, chronic disease; Pancytopenia, acquired ([facility]; Peripheral neuropathy due to chemotherapy ([facility]; Acute subdural  hematoma ([facility]; Chronic anticoagulation - on coumadin for mechanical mitral valve, afib; Subdural hematoma ([place]; and Anticoagulated on warfarin on their problem list.  Significant Hospital Events: Including procedures, antibiotic start and stop dates in addition to other pertinent events   11/23 admit 11/24 to OR for evacuation of SDH 11/25 Repeat CT is stable. EVD taken out. Transfer to progressive 11/27 NSG wanting patient transferred to ICU for closer monitoring for 48 hours to resume heparin 11/29 more aphasic today. EEG pending  Interim History / Subjective:   CT head last night no acute change overnight  This am up in chair; b/l muscle strength; more aphasic and slightly confused; PERRL   Objective:  Blood pressure (!) 121/56, pulse 63, temperature 98.1 F (36.7 C), temperature source Oral, resp. rate (!) 22, height '5\' 6"'$  (1.676 m), weight 95 kg, SpO2 99 %.        Intake/Output Summary (Last 24 hours) at 12/08/2021 0828 Last data filed at 12/08/2021 0800 Gross per 24 hour  Intake 891.43 ml  Output 1100 ml  Net -208.57 ml    Filed Weights   12/06/21 0307 12/07/21 0500 12/08/21 0500  Weight: 106.1 kg 94.9 kg 95 kg    Examination: General:  NAD HEENT: MM pink/moist; EVD in place Neuro:  b/l muscle strength; more aphasic and slightly confused; PERRL CV: s1s2, RRR, no m/r/g PULM:  dim clear BS bilaterally GI: soft, bsx4 active  Extremities: warm/dry, no edema  Skin: no rashes or lesions appreciated  Labs/imaging personally reviewed:  Labs are stable No new imaging  Assessment & Plan:   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 Hx CVA P: -Per NSG -check EEG  -continue keppra -heparin gtt started 11/27 ; consider transferring out later today if no significant findings on  EEG and no neurologic decline -frequent neuro checks -consider repeat CT if neuro change worsens -PT/OT  Hx. A.fib, Mitral stenosis s/p MVR 2014 on chronic coumadin, dCHF  (Echo from EF 60-65%), HTN, HLD. P: -cards following; appreciate recs -cont heparin gtt today; consider bridging to warfarin tomorrow on 11/30 -continue amio -continue home Norvasc, Coreg, Avapro and Lipitor  Hx DM P: - SSI and cbg monitoring  Anemia P: -trend cbc  Best practice (evaluated daily):  Diet/type: NPO - advance as tolerated DVT prophylaxis: systemic heparin GI prophylaxis: N/A Lines: N/A Foley:  N/A Code Status:  full code Last date of multidisciplinary goals of care discussion: 11/29 spoke with patient, husband, and daughter at bedside  Critical care time: NA   JD Rexene [facility] 12/08/2021, 8:28 AM  Please see [email or web address] for pager details.  From 7A-7P if no response, please call [phone]. After hours, please call ELink [phone].

## 2021-12-08 NOTE — Progress Notes (Signed)
Occupational Therapy Treatment Patient Details Name: [name] MRN: [id number] DOB: 1955-07-01 Today's Date: 12/08/2021   History of present illness 66 year old African-American female presents to the ER  with chief complaint of bleeding gums. Patient had several teeth removed by oral surgery on 11/19/2021. 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 11/24 Left frontotemporal craniotomy for evacuation of subdural hematoma, lysis of membranes; history of A-fib, history of mechanical mitral valve placement on chronic Coumadin, CKD stage IIIa, type 2 diabetes, chronic diastolic heart failure.   OT comments  Pt currently demonstrates significant motor planning deficits with basic selfcare tasks.  Overall, she needed max assist for sequencing setup and completion of oral hygiene at the sink.  She would attempt to brush without application of toothpaste and when re-directed would attempt to bring her finger to her mouth once the toothbrush was set down when attempting to open toothpaste.  Max hand over hand needed for guidance.  Min assist currently for toilet transfers with mod assist for simulated toileting.  Oxygen sats maintained above 90% throughout session on room air.  Still with significant expressive language deficits as she would just repeat a word once she stated it initially for any question after that therapist would ask instead of being able to progress and answer the question.  Feel she will continue to benefit from acute care OT at this time.  Recommend transition to AIR for more intense therapy.     Recommendations for follow up therapy are one component of a multi-disciplinary discharge planning process, led by the attending physician.  Recommendations may be updated based on patient status, additional functional criteria and insurance  authorization.    Follow Up Recommendations  Acute inpatient rehab (3hours/day)     Assistance Recommended at Discharge Frequent or constant Supervision/Assistance  Patient can return home with the following  A lot of help with bathing/dressing/bathroom;Assistance with cooking/housework;Assistance with feeding;Help with stairs or ramp for entrance;Assist for transportation;Direct supervision/assist for financial management;Direct supervision/assist for medications management;A little help with walking and/or transfers   Equipment Recommendations  Other (comment) (TBD next venue of care)    Recommendations for Other Services      Precautions / Restrictions Precautions Precautions: Fall Precaution Comments: monitor 02, apraxia, expressive difficulties Restrictions Weight Bearing Restrictions: No       Mobility Bed Mobility                    Transfers Overall transfer level: Needs assistance Equipment used: 1 person hand held assist Transfers: Sit to/from Stand Sit to Stand: Min assist           General transfer comment: Min assist for functional mobility in the room without an assistive device and in the hallway.  Pt attempts to grab hold of items as she walks by for stability.  Slower speed of walking than normal.     Balance Overall balance assessment: Needs assistance Sitting-balance support: No upper extremity supported, Feet supported Sitting balance-Leahy Scale: Good     Standing balance support: No upper extremity supported Standing balance-Leahy Scale: Poor Standing balance comment: Pt needs assist from therapist or hand on surface for mobility.                           ADL either performed or assessed with clinical judgement   ADL Overall ADL's :  Needs assistance/impaired     Grooming: Oral care;Standing;Maximal assistance Grooming Details (indicate cue type and reason): max assist including setup of supplies, applying toothpaste,  and completing steps for task                 Toilet Transfer: Minimal assistance;Ambulation;BSC/3in1 Toilet Transfer Details (indicate cue type and reason): hand held assist to the 3:1 over the toilet Toileting- Clothing Manipulation and Hygiene: Moderate assistance;Sit to/from stand Toileting - Clothing Manipulation Details (indicate cue type and reason): clothing management     Functional mobility during ADLs: Minimal assistance (no assistive device) General ADL Comments: Pt demonstrates significant motor planning deficits when attempting to sequence through oral hygiene.  She needed hand over hand assist to apply toothpaste on the toothbrush secondary to not comprehending and instead continued to try and put the toothbrush in her mouth without it.  When toothbrush was removed from her right hand in order to have her work on opening toothpaste, she instead tried to then put her finger in her mouth.      Cognition Arousal/Alertness: Awake/alert Behavior During Therapy: Flat affect Overall Cognitive Status: Impaired/Different from baseline Area of Impairment: Following commands, Safety/judgement, Awareness, Problem solving, Attention, Memory, Orientation                 Orientation Level: Place, Time, Situation (Pt unable to state orientation when asked and when given simple choices such as "hospital" and asked "yes/no" she is still inconsistent.) Current Attention Level: Focused   Following Commands: Follows one step commands with increased time, Follows multi-step commands inconsistently Safety/Judgement: Decreased awareness of safety, Decreased awareness of deficits   Problem Solving: Slow processing, Difficulty sequencing, Requires tactile cues, Requires verbal cues General Comments: Pt with motor planning difficulties noted during grooming tasks when attemtping to brush her teeth.  Will continually state the same word such as "fine" when asked if she needs to go to the  bathroom or other questions.  Gets stuck on the same word frequently once she states it initially.                   Pertinent Vitals/ Pain       Pain Assessment Pain Assessment: No/denies pain         Frequency  Min 2X/week        Progress Toward Goals  OT Goals(current goals can now be found in the care plan section)  Progress towards OT goals: Progressing toward goals  Acute Rehab OT Goals Patient Stated Goal: Pt and family are interested in going to AIR OT Goal Formulation: With patient/family Time For Goal Achievement: 12/19/21 Potential to Achieve Goals: Good  Plan Discharge plan remains appropriate       AM-PAC OT "6 Clicks" Daily Activity     Outcome Measure   Help from another person eating meals?: A Little Help from another person taking care of personal grooming?: A Lot Help from another person toileting, which includes using toliet, bedpan, or urinal?: A Lot Help from another person bathing (including washing, rinsing, drying)?: A Lot Help from another person to put on and taking off regular upper body clothing?: A Lot Help from another person to put on and taking off regular lower body clothing?: A Lot 6 Click Score: 13    End of Session Equipment Utilized During Treatment: Gait belt  OT Visit Diagnosis: Unsteadiness on feet (R26.81);Other abnormalities of gait and mobility (R26.89);Muscle weakness (generalized) (M62.81);Low vision, both eyes (H54.2);Other symptoms and signs involving cognitive  function;Cognitive communication deficit (R41.841) Symptoms and signs involving cognitive functions: Nontraumatic SAH   Activity Tolerance Patient tolerated treatment well   Patient Left in chair   Nurse Communication Mobility status        Time: 8592-9244 OT Time Calculation (min): 39 min  Charges: OT General Charges $OT Visit: 1 Visit OT Treatments $Self Care/Home Management : 38-52 mins  Nickcole Bralley OTR/L 12/08/2021, 2:11 PM

## 2021-12-08 NOTE — Progress Notes (Signed)
ANTICOAGULATION CONSULT NOTE  Pharmacy Consult:  Heparin Indication: Mechanical mitral valve  Allergies  Allergen Reactions   Chlorhexidine Other (See Comments)    Unknown    Patient Measurements: Height: '5\' 6"'$  (167.6 cm) Weight: 95 kg (209 lb 7 oz) IBW/kg (Calculated) : 59.3 Heparin Dosing Weight: 84 kg  Vital Signs: Temp: 99.3 F (37.4 C) (11/29 2000) Temp Source: Oral (11/29 2000) BP: 114/76 (11/29 2142) Pulse Rate: 69 (11/29 2142)  Labs: Recent Labs    12/06/21 0423 12/07/21 0054 12/07/21 0705 12/08/21 0556 12/08/21 1210 12/08/21 2054  HGB 7.9* 8.0*  --  8.5*  --   --   HCT 24.3* 24.9*  --  26.3*  --   --   PLT 140* 147*  --  159  --   --   LABPROT 15.3*  --   --   --   --   --   INR 1.2  --   --   --   --   --   HEPARINUNFRC  --  0.11*   < > 0.41 0.45 0.40  CREATININE 0.75 0.88  --   --   --   --    < > = values in this interval not displayed.     Estimated Creatinine Clearance: 72.1 mL/min (by C-G formula based on SCr of 0.88 mg/dL).  Assessment: 68 YOF with history of mechanical MVR in 2014 and Afib on Coumadin PTA admitted 11/23 with bleeding gums s/p teeth removal on 11/19/21.  CT on admit also showed acute subdural hematoma with midline shift.  She received KCentra, Vitamin K '10mg'$  and TXA on 11/23.  S/p crani for evacuation on 12/03/21.  Pharmacy consulted to dose IV heparin.   Heparin level therapeutic tonight. Cont current rate.   Goal of Therapy:  Heparin level 0.3-0.4 units/ml per NSGY, no bolus Monitor platelets by anticoagulation protocol: Yes   Plan:  Continue IV heparin at 1150 units/hr Daily heparin level and CBC  Onnie Boer, PharmD, BCIDP, AAHIVP, CPP Infectious Disease Pharmacist 12/08/2021 9:45 PM

## 2021-12-08 NOTE — Procedures (Signed)
Patient Name: [name]  MRN: [id number]  Epilepsy Attending: Lora Havens  Referring Physician/Provider: Karsten Ro, DO  Date: 12/08/2021 Duration: 28.38 mins  Patient history: 66yo F with left SDH. EEG to evaluate for seizure.  Level of alertness:  awake  AEDs during EEG study: LEV  Technical aspects: This EEG study was done with scalp electrodes positioned according to the 10-20 International system of electrode placement. Electrical activity was reviewed with band pass filter of 1-'70Hz'$ , sensitivity of 7 uV/mm, display speed of 53m/sec with a '60Hz'$  notched filter applied as appropriate. EEG data were recorded continuously and digitally stored.  Video monitoring was available and reviewed as appropriate.  Description: No posterior dominant rhythm was seen. EEG showed continuous generalized and lateralized left hemisphere 2-'3hz'$  delta slowing admixed with 5-'6hz'$  theta slowing in right hemisphere. Sharp waves were noted in left frontal region.  Hyperventilation and photic stimulation were not performed.     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 Additionally there is cortical dysfunction arising from left hemisphere likely secondary to underlying structural abnormality. Lastly there is  moderate diffuse encephalopathy, nonspecific etiology. No seizures were seen throughout the recording.  Raquel Sayres OBarbra Sarks

## 2021-12-08 NOTE — Progress Notes (Signed)
EEG complete - results pending 

## 2021-12-08 NOTE — Progress Notes (Signed)
Physical Therapy Treatment Patient Details Name: [name] MRN: [id number] DOB: 03/08/1955 Today's Date: 12/08/2021   History of Present Illness 66 year old African-American female presents to the ER  with chief complaint of bleeding gums. Patient had several teeth removed by oral surgery on 11/19/2021. 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 11/24 Left frontotemporal craniotomy for evacuation of subdural hematoma, lysis of membranes; history of A-fib, history of mechanical mitral valve placement on chronic Coumadin, CKD stage IIIa, type 2 diabetes, chronic diastolic heart failure.    PT Comments    Pt awake and responsive however demonstrates greater word finding difficulties and receptive aphasia this date. Pt following less commands this date and demos impaired motor planning. Despite worsening aphasia pt with improved ambulation and was able to amb with HHA today and max directional verbal cues. Pt also with what appeared to be more of a L bias today compared to previous L neglect the last few days. Acute PT to cont to follow. Continue to recommend AIR upon d/c to address cognitiveand motor deficits.    Recommendations for follow up therapy are one component of a multi-disciplinary discharge planning process, led by the attending physician.  Recommendations may be updated based on patient status, additional functional criteria and insurance authorization.  Follow Up Recommendations  Acute inpatient rehab (3hours/day)     Assistance Recommended at Discharge Frequent or constant Supervision/Assistance  Patient can return home with the following A lot of help with walking and/or transfers;A lot of help with bathing/dressing/bathroom   Equipment Recommendations  Rolling walker (2 wheels);BSC/3in1    Recommendations for Other Services OT  consult;Speech consult;Rehab consult     Precautions / Restrictions Precautions Precautions: Fall Precaution Comments: monitor O2, hovers around 89/90 on RA at rest Restrictions Weight Bearing Restrictions: No     Mobility  Bed Mobility Overal bed mobility: Needs Assistance Bed Mobility: Supine to Sit     Supine to sit: Min assist     General bed mobility comments: max verbal and tactile directional cues, step by step to complete task, minA for safety, increased time    Transfers Overall transfer level: Needs assistance Equipment used: 1 person hand held assist Transfers: Sit to/from Stand Sit to Stand: Min assist           General transfer comment: multimodal cues to initiate with anterior weight shift    Ambulation/Gait Ambulation/Gait assistance: +2 safety/equipment, Mod assist (to push IV pole) Gait Distance (Feet): 150 Feet Assistive device: 1 person hand held assist Gait Pattern/deviations: Decreased step length - right, Decreased step length - left, Wide base of support, Drifts right/left Gait velocity: slowed Gait velocity interpretation: <1.31 ft/sec, indicative of household ambulator   General Gait Details: multimodal directional cues to navigate hallway. provided HHA on the R, pt with noted L head turn/gaze bias, difficulty acknowleging things on the R despite max verbal and tactile cues, short step height and length   Stairs             Wheelchair Mobility    Modified Rankin (Stroke Patients Only) Modified Rankin (Stroke Patients Only) Pre-Morbid Rankin Score: No significant disability Modified Rankin: Moderately severe disability     Balance Overall balance assessment: Needs assistance Sitting-balance support: No upper extremity supported, Feet supported Sitting balance-Leahy Scale: Good     Standing balance support: Bilateral upper extremity supported Standing balance-Leahy Scale: Poor Standing balance  comment: dependent on  external support                            Cognition Arousal/Alertness: Awake/alert Behavior During Therapy: Flat affect Overall Cognitive Status: Impaired/Different from baseline Area of Impairment: Following commands, Safety/judgement, Awareness, Problem solving, Attention, Memory, Orientation                 Orientation Level: Disoriented to (slow to respond, states only first name when asked name, verbal cues for last name, unable to state hospital but when asked if she was in a restaurant she responded no, then responded yes to hospital) Current Attention Level: Focused Memory: Decreased short-term memory Following Commands: Follows one step commands with increased time, Follows one step commands inconsistently Safety/Judgement: Decreased awareness of safety, Decreased awareness of deficits Awareness: Intellectual Problem Solving: Slow processing, Decreased initiation, Difficulty sequencing, Requires verbal cues, Requires tactile cues General Comments: pt appears to have worsening expressive aphasia and word finding difficulty today in addition to receptive aphasia. Pt typically able to follow simple commands majority of time however today was UNABLE to follow commands. especially when discerning between L and R. Pt unable to mimic PT ie. show me 2 fingers, pt unable to follow commands or copy PT demonstration even with tactile cues. Pt appears to have more of a L bias this date compared to previous R bias.        Exercises      General Comments General comments (skin integrity, edema, etc.): assist to bathroom however pt unsuccessful despite reporting she urinated, VSS, SpO2 at 88% on 1lO2 via [place] during ambulation, returned to 97% once sitting      Pertinent Vitals/Pain Pain Assessment Pain Assessment: No/denies pain    Home Living                          Prior Function            PT Goals (current goals can now be found in the care plan  section) Acute Rehab PT Goals PT Goal Formulation: With patient/family Time For Goal Achievement: 12/19/21 Potential to Achieve Goals: Good Progress towards PT goals: Progressing toward goals    Frequency    Min 4X/week      PT Plan Current plan remains appropriate    Co-evaluation              AM-PAC PT "6 Clicks" Mobility   Outcome Measure  Help needed turning from your back to your side while in a flat bed without using bedrails?: A Little Help needed moving from lying on your back to sitting on the side of a flat bed without using bedrails?: A Lot Help needed moving to and from a bed to a chair (including a wheelchair)?: A Lot Help needed standing up from a chair using your arms (e.g., wheelchair or bedside chair)?: A Lot Help needed to walk in hospital room?: A Lot Help needed climbing 3-5 steps with a railing? : Total 6 Click Score: 12    End of Session Equipment Utilized During Treatment: Gait belt Activity Tolerance: Patient tolerated treatment well Patient left: in chair;with call bell/phone within reach;with chair alarm set;with family/visitor present Nurse Communication: Mobility status PT Visit Diagnosis: Unsteadiness on feet (R26.81);Other abnormalities of gait and mobility (R26.89);Muscle weakness (generalized) (M62.81);Other symptoms and signs involving the nervous system (X32.355)     Time: 7322-0254 PT Time Calculation (min) (ACUTE  ONLY): 36 min  Charges:  $Gait Training: 23-37 mins                     Kittie Plater, PT, DPT Acute Rehabilitation Services Secure chat preferred Office #: [phone]    Berline Lopes 12/08/2021, 11:40 AM

## 2021-12-08 NOTE — Progress Notes (Signed)
Inpatient Rehabilitation Admissions Coordinator   I met at bedside with patient and daughter. I await medical readiness to admit to CIR. I have insurance approval.  Danne Baxter, RN, MSN Rehab Admissions Coordinator [phone] 12/08/2021 12:26 PM

## 2021-12-08 NOTE — Progress Notes (Signed)
   Providing Compassionate, Quality Care - Together  NEUROSURGERY PROGRESS NOTE   S: No issues overnight. Intermittent rec/expr aphasia  O: EXAM:  BP (!) 126/56   Pulse 67   Temp 98.1 F (36.7 C) (Oral)   Resp (!) 21   Ht '5\' 6"'$  (1.676 m)   Wt 95 kg   SpO2 98%   BMI 33.80 kg/m   Awake, alert, disoriented  PERRL Speech is short but fluent, some intermittent word finding issues CNs grossly intact  MAE equally  Incision c/d/i   ASSESSMENT:  66 y.o. female with    Left mixed density subdural hematoma with mass effect Mechanical heart valve   -Status post left craniotomy for evacuation of hematoma, postoperative day 4   PLAN: -ct brain stable now that therapeutic on heparin -cont to closely monitor  Thank you for allowing me to participate in this patient's care.  Please do not hesitate to call with questions or concerns.   Elwin Sleight, [facility] Cell: [phone]

## 2021-12-08 NOTE — Progress Notes (Signed)
   Providing Compassionate, Quality Care - Together  NEUROSURGERY PROGRESS NOTE   S: No issues overnight. Some slight worsening aphasia, still intermittent  O: EXAM:  BP (!) 126/56   Pulse 67   Temp 98.1 F (36.7 C) (Oral)   Resp (!) 21   Ht '5\' 6"'$  (1.676 m)   Wt 95 kg   SpO2 98%   BMI 33.80 kg/m   Awake, alert, disoriented  PERRL Speech is short but fluent, some intermittent word finding issues CNs grossly intact  MAE equally  Incision c/d/i   ASSESSMENT:  66 y.o. female with    Left mixed density subdural hematoma with mass effect Mechanical heart valve   -Status post left craniotomy for evacuation of hematoma, postoperative day 5   PLAN: -ct brain stable now that therapeutic on heparin -ok to begin oral anticoag without bolus/loading dose -can step down from unit today -eeg pending  Thank you for allowing me to participate in this patient's care.  Please do not hesitate to call with questions or concerns.   Elwin Sleight, [facility] Cell: [phone]

## 2021-12-09 ENCOUNTER — Other Ambulatory Visit: Payer: Self-pay | Admitting: Interventional Cardiology

## 2021-12-09 DIAGNOSIS — S065XAA Traumatic subdural hemorrhage with loss of consciousness status unknown, initial encounter: Secondary | ICD-10-CM | POA: Diagnosis not present

## 2021-12-09 DIAGNOSIS — I10 Essential (primary) hypertension: Secondary | ICD-10-CM

## 2021-12-09 LAB — GLUCOSE, CAPILLARY
Glucose-Capillary: 148 mg/dL — ABNORMAL HIGH (ref 70–99)
Glucose-Capillary: 153 mg/dL — ABNORMAL HIGH (ref 70–99)
Glucose-Capillary: 124 mg/dL — ABNORMAL HIGH (ref 70–99)
Glucose-Capillary: 178 mg/dL — ABNORMAL HIGH (ref 70–99)
Glucose-Capillary: 145 mg/dL — ABNORMAL HIGH (ref 70–99)

## 2021-12-09 LAB — CBC
HCT: 26.9 % — ABNORMAL LOW (ref 36.0–46.0)
Hemoglobin: 8.7 g/dL — ABNORMAL LOW (ref 12.0–15.0)
MCH: 31.8 pg (ref 26.0–34.0)
MCHC: 32.3 g/dL (ref 30.0–36.0)
MCV: 98.2 fL (ref 80.0–100.0)
Platelets: 168 10*3/uL (ref 150–400)
RBC: 2.74 MIL/uL — ABNORMAL LOW (ref 3.87–5.11)
RDW: 12.8 % (ref 11.5–15.5)
WBC: 6.3 10*3/uL (ref 4.0–10.5)
nRBC: 0 % (ref 0.0–0.2)

## 2021-12-09 LAB — PROTIME-INR
INR: 1.1 (ref 0.8–1.2)
Prothrombin Time: 14.2 seconds (ref 11.4–15.2)

## 2021-12-09 LAB — HEPARIN LEVEL (UNFRACTIONATED): Heparin Unfractionated: 0.39 IU/mL (ref 0.30–0.70)

## 2021-12-09 MED ORDER — WARFARIN SODIUM 6 MG PO TABS
6.0000 mg | ORAL_TABLET | Freq: Once | ORAL | Status: AC
  Administered 2021-12-09: 6 mg via ORAL
  Filled 2021-12-09: qty 1

## 2021-12-09 MED ORDER — WARFARIN - PHARMACIST DOSING INPATIENT: Freq: Every day | Status: DC

## 2021-12-09 NOTE — Progress Notes (Signed)
PROGRESS NOTE    [name]  WCB:[id number] DOB: 03-17-1955 DOA: 12/01/2021 PCP: Lennie Odor, PA   Brief Narrative:  [name] is a 66 y.o. female who has a PMH of A.fib and mechanical MVR in 2014 on chronic Coumadin, CKD, DM. She presented to ED 11/23 with bleeding gums after dental extraction 11/10. As part of her workup, she had CT head which demonstrated acute on chronic bilateral DH with 63m L to R MLS. [facility] Kcentra [facility]. Patient taken to OR 11/24 for left frontotemporal craniotomy for evacuation of SDH, lysis, of membranes. Transitioned to the ICU postoperatively under neurosurgical care.   11/23 admitted to hospitalist service 11/24 to OR for evacuation of SDH - transitioned to ICU/NeuroSx services 11/25 Repeat CT is stable. EVD taken out. Transfer to progressive 11/27 NSG wanting patient transferred to ICU for closer monitoring for 48 hours to resume heparin 11/29 more aphasic/somnolent. 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 - NeuroSx following post operatively - EEG with epileptogenicity but no over seizures - appreciate neuroSx recs - Continue keppra - Transition off heparin gtt started 11/27 - resume warfarin - pharmacy assisting with bridging -PT/OT recommending CIR   Acute metabolic  encephalopathy Rule out concurrent seizure as above -Post operative imaging stable -Continue to follow w/ supportive care -No signs/symptoms history for alcohol/substane use/abuse. -EEG as above -continue keppra  A.fib, rate controlled Mitral stenosis s/p MVR 2014 on chronic coumadin Diastolic HF (Echo from EF 60-65%) HTN HLD -cards signing off, appreciate assistance, no further indication for intervention -continue amio as well as home Norvasc, Coreg, Avapro and Lipitor   DM type 2 not on insulin -Continue sliding scale post operatively -Transition back to diabetic diet/metformin at DC given well controlled status   Anemia -Normocytic - likely chronic anemia of chronic disease with acute component given recent bleeds.  DVT prophylaxis: Heparin gtt-->warfarin bridge Code Status: Full Family Communication: Husband at bedside  Status is: INPT  Dispo: The patient is from: Home              Anticipated d/c is to: CIR/SNF              Anticipated d/c date is: 24-48h pending improvement              Patient currently NOT medically stable for discharge  Consultants:  NeuroSx  Procedures:  As above, IVH repair/evacuation  Antimicrobials:  None indicated   Subjective: No acute issues/events overnight - husband at badside - patient poorly interactive "just tired" per her and family. ROS somewhat limited  Objective: Vitals:   12/09/21 0311 12/09/21 0802 12/09/21 0857 12/09/21 1106  BP: 121/67 (!) 110/47 123/61 (!) 128/55  Pulse: 72 67  71  Resp: '16 18  18  '$ Temp: 98.4 F (36.9 C) 98.6 F (37 C)  97.8 F (36.6 C)  TempSrc: Oral     SpO2: 98% 100% 95% 93%  Weight:      Height:        Intake/Output Summary (Last 24 hours) at 12/09/2021 1348 Last data filed at 12/09/2021 0800 Gross per 24 hour  Intake 313.34 ml  Output 800 ml  Net -486.66 ml   Filed Weights   12/06/21 0307 12/07/21 0500 12/08/21 0500  Weight: 106.1 kg 94.9 kg 95 kg    Examination:  General  exam: Somnolent but arousable, oriented to person only Respiratory system: Clear to auscultation. Respiratory effort normal. Cardiovascular system: S1 & S2 heard, RRR. No JVD, murmurs, rubs, gallops Gastrointestinal system: Abdomen is nondistended, soft and nontender Central nervous system: Alert and awake, orientation limited Extremities: Symmetric without gross deficits Psychiatry: Unable to assess   Data Reviewed: I have personally reviewed following labs and imaging studies  CBC: Recent Labs  Lab 12/03/21 0238 12/04/21 0424 12/05/21 0428 12/06/21 0423 12/07/21 0054 12/08/21 0556 12/09/21 0743  WBC 8.4 11.2* 8.2 6.8 6.4 6.3 6.3  NEUTROABS 5.9 9.6* 5.9 4.9 4.4  --   --   HGB 9.3* 8.9* 8.6* 7.9* 8.0* 8.5* 8.7*  HCT 28.8* 28.4* 28.0* 24.3* 24.9* 26.3* 26.9*  MCV 100.0 99.6 102.6* 100.0 100.4* 100.0 98.2  PLT 180 178 174 140* 147* 159 301   Basic Metabolic Panel: Recent Labs  Lab 12/03/21 0238 12/04/21 0424 12/05/21 0428 12/06/21 0423 12/07/21 0054  NA 141 142 141 140 140  K 4.0 4.3 3.9 3.8 4.1  CL 104 107 109 108 104  CO2 '25 25 27 27 26  '$ GLUCOSE 131* 173* 119* 129* 156*  BUN '17 14 12 12 15  '$ CREATININE 1.07* 0.85 0.79 0.75 0.88  CALCIUM 9.2 9.0 8.3* 8.3* 8.6*  MG 1.9 1.9 1.9 1.9 1.9   GFR: Estimated Creatinine Clearance: 72.1 mL/min (by C-G formula based on SCr of 0.88 mg/dL). Liver Function Tests: No results for input(s): "AST", "ALT", "ALKPHOS", "BILITOT", "PROT", "ALBUMIN" in the last 168 hours. No results for input(s): "LIPASE", "AMYLASE" in the last 168 hours. No results for input(s): "AMMONIA" in the last 168 hours. Coagulation Profile: Recent Labs  Lab 12/03/21 0238 12/04/21 0424 12/05/21 0428 12/06/21 0423  INR 1.1 1.2 1.2 1.2   Cardiac Enzymes: No results for input(s): "CKTOTAL", "CKMB", "CKMBINDEX", "TROPONINI" in the last 168 hours. BNP (last 3 results) No results for input(s): "PROBNP" in the last 8760 hours. HbA1C: No results for input(s):  "HGBA1C" in the last 72 hours. CBG: Recent Labs  Lab 12/08/21 1716 12/08/21 2133 12/09/21 0313 12/09/21 0802 12/09/21 1106  GLUCAP 118* 158* 148* 153* 124*   Lipid Profile: No results for input(s): "CHOL", "HDL", "LDLCALC", "TRIG", "CHOLHDL", "LDLDIRECT" in the last 72 hours. Thyroid Function Tests: No results for input(s): "TSH", "T4TOTAL", "FREET4", "T3FREE", "THYROIDAB" in the last 72 hours. Anemia Panel: No results for input(s): "VITAMINB12", "FOLATE", "FERRITIN", "TIBC", "IRON", "RETICCTPCT" in the last 72 hours. Sepsis Labs: No results for input(s): "PROCALCITON", "LATICACIDVEN" in the last 168 hours.  Recent Results (from the past 240 hour(s))  Surgical PCR screen     Status: None   Collection Time: 12/02/21  7:38 PM   Specimen: Nasal Mucosa; Nasal Swab  Result Value Ref Range Status   MRSA, PCR NEGATIVE NEGATIVE Final   Staphylococcus aureus NEGATIVE NEGATIVE Final    Comment: (NOTE) The Xpert SA Assay (FDA approved for NASAL specimens in patients 52 years of age and older), is one component of  a comprehensive surveillance program. It is not intended to diagnose infection nor to guide or monitor treatment. Performed at [facility], [place] [address], [place], [place] [address]          Radiology Studies: EEG adult  Result Date: 2021-12-16 Lora Havens, MD     12-16-2021  5:54 PM Patient Name: JAMILLE FISHER MRN: [id number] Epilepsy Attending: Lora Havens Referring Physician/Provider: Karsten Ro, DO Date: 12/16/2021 Duration: 28.38 mins Patient history: 66yo F with left SDH. EEG to evaluate for seizure. Level of alertness:  awake AEDs during EEG study: LEV Technical aspects: This EEG study was done with scalp electrodes positioned according to the 10-20 International system of electrode placement. Electrical activity was reviewed with band pass filter of 1-'70Hz'$ , sensitivity of 7 uV/mm, display speed of 78m/sec with a '60Hz'$  notched filter applied as  appropriate. EEG data were recorded continuously and digitally stored.  Video monitoring was available and reviewed as appropriate. Description: No posterior dominant rhythm was seen. EEG showed continuous generalized and lateralized left hemisphere 2-'3hz'$  delta slowing admixed with 5-'6hz'$  theta slowing in right hemisphere. Sharp waves were noted in left frontal region.  Hyperventilation and photic stimulation were not performed.   ABNORMALITY -Sharp wave, left frontal region - Continuous slow, generalized and lateralized left hemisphere IMPRESSION: This study showed evidence of epileptogenicity arising from left frontal region. Additionally there is cortical dysfunction arising from left hemisphere likely secondary to underlying structural abnormality. Lastly there is moderate diffuse encephalopathy, nonspecific etiology. No seizures were seen throughout the recording. Priyanka OBarbra Sarks  CT HEAD WO CONTRAST (5MM)  Result Date: 1December 07, 2023CLINICAL DATA:  Follow-up subdural hematoma.  Headache. EXAM: CT HEAD WITHOUT CONTRAST TECHNIQUE: Contiguous axial images were obtained from the base of the skull through the vertex without intravenous contrast. RADIATION DOSE REDUCTION: This exam was performed according to the departmental dose-optimization program which includes automated exposure control, adjustment of the mA and/or kV according to patient size and/or use of iterative reconstruction technique. COMPARISON:  CT head 12/07/2021 FINDINGS: Brain: Mixed density subdural hematoma in the left frontal parietal and temporal lobe is unchanged. This measures up to 10 mm in thickness. Majority of the fluid is high density but there is also low-density fluid. Subdural gas bubbles unchanged from recent craniotomy. Mass-effect with approximate 4 mm midline shift to the right appears unchanged. Small right temporal subdural hematoma unchanged. No new area of hemorrhage.  No hydrocephalus.  No acute infarct. Vascular: Negative  for hyperdense vessel Skull: Left frontal craniotomy unchanged. No acute skeletal abnormality. Sinuses/Orbits: Mild mucosal edema sphenoid sinus.  Negative orbit Other: None IMPRESSION: 1. Mixed density subdural hematoma on the left is unchanged. Mass-effect with 4 mm midline shift to the right unchanged. 2. Small right temporal subdural hematoma unchanged. 3. No new area of hemorrhage. Electronically Signed   By: CFranchot GalloM.D.   On: 112/07/202313:32   CT HEAD WO CONTRAST (5MM)  Result Date: 12/07/2021 CLINICAL DATA:  Intracranial hemorrhage.  Postop craniotomy. EXAM: CT HEAD WITHOUT CONTRAST TECHNIQUE: Contiguous axial images were obtained from the base of the skull through the vertex without intravenous contrast. RADIATION DOSE REDUCTION: This exam was performed according to the departmental dose-optimization program which includes automated exposure control, adjustment of the mA and/or kV according to patient size and/or use of iterative reconstruction technique. COMPARISON:  12/06/2021 at 5:11 a.m. FINDINGS: Brain: Unchanged appearance of left convexity subdural hematoma with small volume pneumocephalus. 2-3 mm of rightward midline  shift is unchanged when measured at the level of the foramina Monro. No new site of hemorrhage. Vascular: No hyperdense vessel or unexpected calcification. Skull: Recent left-sided craniotomy Sinuses/Orbits: No acute finding. Other: None. IMPRESSION: Unchanged appearance of left convexity subdural hematoma with small volume pneumocephalus. Electronically Signed   By: Ulyses Jarred M.D.   On: 12/07/2021 18:48    Scheduled Meds:  amiodarone  200 mg Oral Daily   amLODipine  10 mg Oral Daily   atorvastatin  40 mg Oral Daily   carvedilol  12.5 mg Oral BID   docusate sodium  100 mg Oral BID   insulin aspart  0-15 Units Subcutaneous TID WC   insulin aspart  0-5 Units Subcutaneous QHS   irbesartan  300 mg Oral Daily   levETIRAcetam  500 mg Oral BID   pantoprazole  40 mg  Oral Daily   polyethylene glycol  17 g Oral Daily   Continuous Infusions:  heparin 1,150 Units/hr (12/09/21 0604)     LOS: 7 days   Time spent: 54mn  Tyger Oka C Greysin Medlen, DO [facility]  If 7PM-7AM, please contact night-coverage [email or web address]  12/09/2021, 1:48 PM

## 2021-12-09 NOTE — Progress Notes (Signed)
Mobility Specialist: Progress Note   12/09/21 1356  Mobility  Activity Ambulated with assistance in hallway  Level of Assistance Minimal assist, patient does 75% or more  Assistive Device Other (Comment) (IV pole)  Distance Ambulated (ft) 100 ft  Activity Response Tolerated well  Mobility Referral Yes  $Mobility charge 1 Mobility   Pt received in the chair and agreeable to mobility. Contact guard for balance to stand. Verbal cues for direction and management of IV pole with occasional physical assist, minA. No c/o throughout. Pt back to the chair after session with call bell at her side. Chair alarm is on.   [place] Havier Deeb Mobility Specialist Please contact via SecureChat or Rehab office at [phone]

## 2021-12-09 NOTE — Progress Notes (Signed)
Physical Therapy Treatment Patient Details Name: [name] MRN: [id number] DOB: October 07, 1955 Today's Date: 12/09/2021   History of Present Illness 66 year old African-American female presents to the ER  with chief complaint of bleeding gums. Patient had several teeth removed by oral surgery on 11/19/2021. 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 11/24 Left frontotemporal craniotomy for evacuation of subdural hematoma, lysis of membranes; history of A-fib, history of mechanical mitral valve placement on chronic Coumadin, CKD stage IIIa, type 2 diabetes, chronic diastolic heart failure.    PT Comments    Pt greeted supine in bed, lethargic however easily aroused and agreeable to session. Pt demonstrating improved motor planning this session able to come to sitting EOB without sequencing cues and return to supine with light min assist to center in bed once supine. Pt needing assist throughout ambulation for navigation around and through obstacles in hall. No overt L bias or L neglect noted this session, pt with x1 drift to R in hall, and able to turn to L to turn around to return to room. Pt with minimal verbalizations throughout session, occasional yes and no and humming during ambulation. Current plan remains appropriate to address deficits and maximize functional independence and decrease caregiver burden. Pt continues to benefit from skilled PT services to progress toward functional mobility goals.    Recommendations for follow up therapy are one component of a multi-disciplinary discharge planning process, led by the attending physician.  Recommendations may be updated based on patient status, additional functional criteria and insurance authorization.  Follow Up Recommendations  Acute inpatient rehab (3hours/day)     Assistance Recommended at  Discharge Frequent or constant Supervision/Assistance  Patient can return home with the following A lot of help with walking and/or transfers;A lot of help with bathing/dressing/bathroom   Equipment Recommendations  Rolling walker (2 wheels);BSC/3in1    Recommendations for Other Services       Precautions / Restrictions Precautions Precautions: Fall Precaution Comments: monitor O2, hovers around 89/90 on RA at rest Restrictions Weight Bearing Restrictions: No     Mobility  Bed Mobility Overal bed mobility: Needs Assistance Bed Mobility: Supine to Sit, Sit to Supine     Supine to sit: Min assist Sit to supine: Min assist   General bed mobility comments: min guard for safety light assist to manage LEs, pt with improved sequencing    Transfers Overall transfer level: Needs assistance Equipment used: 1 person hand held assist Transfers: Sit to/from Stand Sit to Stand: Min assist           General transfer comment: multimodal cues to initiate with anterior weight shift    Ambulation/Gait Ambulation/Gait assistance: Min assist, Min guard Gait Distance (Feet): 150 Feet Assistive device: IV Pole Gait Pattern/deviations: Decreased step length - right, Decreased step length - left, Wide base of support, Drifts right/left Gait velocity: slowed     General Gait Details: multimodal directional cues to navigate hallway.pt able to push IV pole with both hands and maintain close to center of hall without bumping into obstacels   Stairs             Wheelchair Mobility    Modified Rankin (Stroke Patients Only) Modified Rankin (Stroke Patients Only) Pre-Morbid Rankin Score: No significant disability Modified Rankin: Moderately severe disability     Balance Overall balance assessment: Needs assistance Sitting-balance support: No upper extremity supported, Feet supported  Sitting balance-Leahy Scale: Good     Standing balance support: Bilateral upper extremity  supported Standing balance-Leahy Scale: Poor Standing balance comment: dependent on external support                            Cognition Arousal/Alertness: Awake/alert Behavior During Therapy: Flat affect Overall Cognitive Status: Impaired/Different from baseline Area of Impairment: Following commands, Safety/judgement, Awareness, Problem solving, Attention, Memory, Orientation                 Orientation Level: Disoriented to (slow to respond, states only first name when asked name, verbal cues for last name, unable to state hospital but when asked if she was in a restaurant she responded no, then responded yes to hospital) Current Attention Level: Focused Memory: Decreased short-term memory Following Commands: Follows one step commands with increased time, Follows one step commands inconsistently Safety/Judgement: Decreased awareness of safety, Decreased awareness of deficits Awareness: Intellectual Problem Solving: Slow processing, Decreased initiation, Difficulty sequencing, Requires verbal cues, Requires tactile cues General Comments: pt appears to have worsening expressive aphasia and word finding difficulty today in addition to receptive aphasia.pt able to follow all commands, only verbalizing yea and no throughout session        Exercises      General Comments        Pertinent Vitals/Pain Pain Assessment Pain Assessment: No/denies pain Pain Intervention(s): Monitored during session    Home Living                          Prior Function            PT Goals (current goals can now be found in the care plan section) Acute Rehab PT Goals PT Goal Formulation: With patient/family Time For Goal Achievement: 12/19/21    Frequency    Min 4X/week      PT Plan Current plan remains appropriate    Co-evaluation              AM-PAC PT "6 Clicks" Mobility   Outcome Measure  Help needed turning from your back to your side while in  a flat bed without using bedrails?: A Little Help needed moving from lying on your back to sitting on the side of a flat bed without using bedrails?: A Lot Help needed moving to and from a bed to a chair (including a wheelchair)?: A Lot Help needed standing up from a chair using your arms (e.g., wheelchair or bedside chair)?: A Lot Help needed to walk in hospital room?: A Lot Help needed climbing 3-5 steps with a railing? : Total 6 Click Score: 12    End of Session Equipment Utilized During Treatment: Gait belt Activity Tolerance: Patient tolerated treatment well Patient left: with call bell/phone within reach;in bed;with bed alarm set Nurse Communication: Mobility status PT Visit Diagnosis: Unsteadiness on feet (R26.81);Other abnormalities of gait and mobility (R26.89);Muscle weakness (generalized) (M62.81);Other symptoms and signs involving the nervous system (E99.371)     Time: 6967-8938 PT Time Calculation (min) (ACUTE ONLY): 20 min  Charges:  $Gait Training: 8-22 mins                     Audry Riles. PTA Acute Rehabilitation Services Office: [place] 12/09/2021, 3:57 PM

## 2021-12-09 NOTE — TOC Progression Note (Signed)
Transition of Care [facility]) - Progression Note    Patient Details  Name: [name] MRN: [id number] Date of Birth: 05/15/1955  [facility] CM/SW Contact  Pollie Friar, RN Phone Number: 12/09/2021, 11:45 AM  Clinical Narrative:    Plan is for admission to CIR tomorrow or Saturday.  TOC following.   Expected Discharge Plan: IP Rehab Facility Barriers to Discharge: Continued Medical Work up  Expected Discharge Plan and Services Expected Discharge Plan: [place]     Post Acute Care Choice: IP Rehab Living arrangements for the past 2 months: Single Family Home                                       Social Determinants of Health (SDOH) Interventions Housing Interventions: Patient Refused  Readmission Risk Interventions     No data to display

## 2021-12-09 NOTE — Progress Notes (Addendum)
ANTICOAGULATION CONSULT NOTE  Pharmacy Consult:  Heparin Indication: Mechanical mitral valve  Allergies  Allergen Reactions   Chlorhexidine Other (See Comments)    Unknown    Patient Measurements: Height: '5\' 6"'$  (167.6 cm) Weight: 95 kg (209 lb 7 oz) IBW/kg (Calculated) : 59.3 Heparin Dosing Weight: 84 kg  Vital Signs: Temp: 98.6 F (37 C) (11/30 0802) Temp Source: Oral (11/30 0311) BP: 123/61 (11/30 0857) Pulse Rate: 67 (11/30 0802)  Labs: Recent Labs    12/07/21 0054 12/07/21 0705 12/08/21 0556 12/08/21 1210 12/08/21 2054 12/09/21 0743  HGB 8.0*  --  8.5*  --   --  8.7*  HCT 24.9*  --  26.3*  --   --  26.9*  PLT 147*  --  159  --   --  168  HEPARINUNFRC 0.11*   < > 0.41 0.45 0.40 0.39  CREATININE 0.88  --   --   --   --   --    < > = values in this interval not displayed.     Estimated Creatinine Clearance: 72.1 mL/min (by C-G formula based on SCr of 0.88 mg/dL).  Assessment: 67 YOF with history of mechanical MVR in 2014 and Afib on Coumadin PTA admitted 11/23 with bleeding gums s/p teeth removal on 11/19/21.  CT on admit also showed acute subdural hematoma with midline shift.  She received KCentra, Vitamin K '10mg'$  and TXA on 11/23.  S/p crani for evacuation on 12/03/21.  Pharmacy consulted to dose IV heparin.   Heparin level remains therapeutic this morning. Since EEG negative, plan to resume warfarin today. Warfarin dosing regimen PTA is '6mg'$  daily. INR today 1.1   Goal of Therapy:  Heparin level 0.3-0.4 units/ml per NSGY, no bolus INR 2.5-3.5 per clinic notes Monitor platelets by anticoagulation protocol: Yes   Plan:  Continue IV heparin at 1150 units/hr Warfarin '6mg'$  PO x1 tonight Daily heparin level and CBC Continue parenteral anticoagulation until INR > 2.5  [name], PharmD, BCPS 12/09/2021 10:28 AM

## 2021-12-09 NOTE — Progress Notes (Signed)
Inpatient Rehabilitation Admissions Coordinator   I met at bedside with patient, spouse and daughter. I await medical clearance to admit to Cir. I will follow up Friday.  Danne Baxter, RN, MSN Rehab Admissions Coordinator [phone] 12/09/2021 5:15 PM

## 2021-12-09 NOTE — Progress Notes (Signed)
   Providing Compassionate, Quality Care - Together  NEUROSURGERY PROGRESS NOTE   S: No issues overnight.  Remains to have intermittent expressive and receptive aphasia  O: EXAM:  BP (!) 128/59 (BP Location: Left Arm)   Pulse 75   Temp 98.6 F (37 C)   Resp 18   Ht '5\' 6"'$  (1.676 m)   Wt 95 kg   SpO2 92%   BMI 33.80 kg/m   Awake, alert, disoriented  PERRL Speech is short but fluent, some intermittent word finding issues CNs grossly intact  MAE equally  Incision c/d/i   ASSESSMENT:  66 y.o. 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  Please do not hesitate to call with questions or concerns.   Elwin Sleight, [facility] Cell: [phone]

## 2021-12-10 ENCOUNTER — Encounter (HOSPITAL_COMMUNITY): Payer: Self-pay | Admitting: Physical Medicine & Rehabilitation

## 2021-12-10 ENCOUNTER — Other Ambulatory Visit: Payer: Self-pay

## 2021-12-10 ENCOUNTER — Inpatient Hospital Stay (HOSPITAL_COMMUNITY)
Admission: RE | Admit: 2021-12-10 | Discharge: 2021-12-16 | DRG: 057 | Disposition: A | Discharge status: Home / Self Care | Payer: Medicare HMO | Source: Intra-hospital | Attending: Physical Medicine & Rehabilitation | Admitting: Physical Medicine & Rehabilitation

## 2021-12-10 DIAGNOSIS — I4819 Other persistent atrial fibrillation: Secondary | ICD-10-CM | POA: Diagnosis present

## 2021-12-10 DIAGNOSIS — Z825 Family history of asthma and other chronic lower respiratory diseases: Secondary | ICD-10-CM | POA: Diagnosis not present

## 2021-12-10 DIAGNOSIS — I69214 Frontal lobe and executive function deficit following other nontraumatic intracranial hemorrhage: Secondary | ICD-10-CM

## 2021-12-10 DIAGNOSIS — Z833 Family history of diabetes mellitus: Secondary | ICD-10-CM

## 2021-12-10 DIAGNOSIS — E7800 Pure hypercholesterolemia, unspecified: Secondary | ICD-10-CM | POA: Diagnosis present

## 2021-12-10 DIAGNOSIS — Z923 Personal history of irradiation: Secondary | ICD-10-CM

## 2021-12-10 DIAGNOSIS — Z79899 Other long term (current) drug therapy: Secondary | ICD-10-CM

## 2021-12-10 DIAGNOSIS — S065XAA Traumatic subdural hemorrhage with loss of consciousness status unknown, initial encounter: Secondary | ICD-10-CM | POA: Diagnosis not present

## 2021-12-10 DIAGNOSIS — N1831 Chronic kidney disease, stage 3a: Secondary | ICD-10-CM | POA: Diagnosis not present

## 2021-12-10 DIAGNOSIS — R414 Neurologic neglect syndrome: Secondary | ICD-10-CM | POA: Diagnosis present

## 2021-12-10 DIAGNOSIS — I10 Essential (primary) hypertension: Secondary | ICD-10-CM | POA: Diagnosis not present

## 2021-12-10 DIAGNOSIS — Z7984 Long term (current) use of oral hypoglycemic drugs: Secondary | ICD-10-CM

## 2021-12-10 DIAGNOSIS — I5032 Chronic diastolic (congestive) heart failure: Secondary | ICD-10-CM | POA: Diagnosis present

## 2021-12-10 DIAGNOSIS — D62 Acute posthemorrhagic anemia: Secondary | ICD-10-CM | POA: Diagnosis present

## 2021-12-10 DIAGNOSIS — E1169 Type 2 diabetes mellitus with other specified complication: Secondary | ICD-10-CM

## 2021-12-10 DIAGNOSIS — E1122 Type 2 diabetes mellitus with diabetic chronic kidney disease: Secondary | ICD-10-CM | POA: Diagnosis present

## 2021-12-10 DIAGNOSIS — I130 Hypertensive heart and chronic kidney disease with heart failure and stage 1 through stage 4 chronic kidney disease, or unspecified chronic kidney disease: Secondary | ICD-10-CM | POA: Diagnosis present

## 2021-12-10 DIAGNOSIS — I69298 Other sequelae of other nontraumatic intracranial hemorrhage: Principal | ICD-10-CM

## 2021-12-10 DIAGNOSIS — I081 Rheumatic disorders of both mitral and tricuspid valves: Secondary | ICD-10-CM | POA: Diagnosis present

## 2021-12-10 DIAGNOSIS — Z8542 Personal history of malignant neoplasm of other parts of uterus: Secondary | ICD-10-CM

## 2021-12-10 DIAGNOSIS — E119 Type 2 diabetes mellitus without complications: Secondary | ICD-10-CM

## 2021-12-10 DIAGNOSIS — R2689 Other abnormalities of gait and mobility: Secondary | ICD-10-CM | POA: Diagnosis present

## 2021-12-10 DIAGNOSIS — J45909 Unspecified asthma, uncomplicated: Secondary | ICD-10-CM | POA: Diagnosis present

## 2021-12-10 DIAGNOSIS — Z8249 Family history of ischemic heart disease and other diseases of the circulatory system: Secondary | ICD-10-CM | POA: Diagnosis not present

## 2021-12-10 DIAGNOSIS — R7989 Other specified abnormal findings of blood chemistry: Secondary | ICD-10-CM | POA: Diagnosis not present

## 2021-12-10 DIAGNOSIS — I6200 Nontraumatic subdural hemorrhage, unspecified: Secondary | ICD-10-CM | POA: Diagnosis not present

## 2021-12-10 DIAGNOSIS — D631 Anemia in chronic kidney disease: Secondary | ICD-10-CM | POA: Diagnosis present

## 2021-12-10 DIAGNOSIS — E669 Obesity, unspecified: Secondary | ICD-10-CM | POA: Diagnosis present

## 2021-12-10 DIAGNOSIS — I69220 Aphasia following other nontraumatic intracranial hemorrhage: Secondary | ICD-10-CM

## 2021-12-10 DIAGNOSIS — R32 Unspecified urinary incontinence: Secondary | ICD-10-CM | POA: Diagnosis present

## 2021-12-10 DIAGNOSIS — D638 Anemia in other chronic diseases classified elsewhere: Secondary | ICD-10-CM | POA: Diagnosis present

## 2021-12-10 DIAGNOSIS — I4891 Unspecified atrial fibrillation: Secondary | ICD-10-CM | POA: Diagnosis not present

## 2021-12-10 DIAGNOSIS — R339 Retention of urine, unspecified: Secondary | ICD-10-CM | POA: Diagnosis not present

## 2021-12-10 DIAGNOSIS — Z7901 Long term (current) use of anticoagulants: Secondary | ICD-10-CM | POA: Diagnosis not present

## 2021-12-10 DIAGNOSIS — Z952 Presence of prosthetic heart valve: Secondary | ICD-10-CM

## 2021-12-10 DIAGNOSIS — Z6833 Body mass index (BMI) 33.0-33.9, adult: Secondary | ICD-10-CM

## 2021-12-10 DIAGNOSIS — R4701 Aphasia: Secondary | ICD-10-CM | POA: Diagnosis present

## 2021-12-10 DIAGNOSIS — Z888 Allergy status to other drugs, medicaments and biological substances status: Secondary | ICD-10-CM

## 2021-12-10 DIAGNOSIS — Z7983 Long term (current) use of bisphosphonates: Secondary | ICD-10-CM

## 2021-12-10 DIAGNOSIS — Z8673 Personal history of transient ischemic attack (TIA), and cerebral infarction without residual deficits: Secondary | ICD-10-CM

## 2021-12-10 LAB — HEPARIN LEVEL (UNFRACTIONATED): Heparin Unfractionated: 0.33 IU/mL (ref 0.30–0.70)

## 2021-12-10 LAB — PROTIME-INR
INR: 1.1 (ref 0.8–1.2)
Prothrombin Time: 14.3 seconds (ref 11.4–15.2)

## 2021-12-10 LAB — CBC
HCT: 28.9 % — ABNORMAL LOW (ref 36.0–46.0)
Hemoglobin: 9.4 g/dL — ABNORMAL LOW (ref 12.0–15.0)
MCH: 31.6 pg (ref 26.0–34.0)
MCHC: 32.5 g/dL (ref 30.0–36.0)
MCV: 97.3 fL (ref 80.0–100.0)
Platelets: 179 10*3/uL (ref 150–400)
RBC: 2.97 MIL/uL — ABNORMAL LOW (ref 3.87–5.11)
RDW: 13.1 % (ref 11.5–15.5)
WBC: 8.4 10*3/uL (ref 4.0–10.5)
nRBC: 0 % (ref 0.0–0.2)

## 2021-12-10 LAB — GLUCOSE, CAPILLARY
Glucose-Capillary: 138 mg/dL — ABNORMAL HIGH (ref 70–99)
Glucose-Capillary: 177 mg/dL — ABNORMAL HIGH (ref 70–99)
Glucose-Capillary: 163 mg/dL — ABNORMAL HIGH (ref 70–99)
Glucose-Capillary: 215 mg/dL — ABNORMAL HIGH (ref 70–99)

## 2021-12-10 MED ORDER — ACETAMINOPHEN 325 MG PO TABS: 325.0000 mg | ORAL_TABLET | ORAL | Status: DC | PRN

## 2021-12-10 MED ORDER — LIDOCAINE HCL URETHRAL/MUCOSAL 2 % EX GEL: CUTANEOUS | Status: DC | PRN

## 2021-12-10 MED ORDER — GUAIFENESIN-DM 100-10 MG/5ML PO SYRP: 5.0000 mL | ORAL_SOLUTION | Freq: Four times a day (QID) | ORAL | Status: DC | PRN

## 2021-12-10 MED ORDER — ENOXAPARIN SODIUM 100 MG/ML IJ SOSY: 100.0000 mg | PREFILLED_SYRINGE | Freq: Two times a day (BID) | INTRAMUSCULAR | 0 refills | Status: DC

## 2021-12-10 MED ORDER — DOCUSATE SODIUM 100 MG PO CAPS: 100.0000 mg | ORAL_CAPSULE | Freq: Two times a day (BID) | ORAL | Status: DC

## 2021-12-10 MED ORDER — PROCHLORPERAZINE 25 MG RE SUPP: 12.5000 mg | Freq: Four times a day (QID) | RECTAL | Status: DC | PRN

## 2021-12-10 MED ORDER — PROCHLORPERAZINE EDISYLATE 10 MG/2ML IJ SOLN: 5.0000 mg | Freq: Four times a day (QID) | INTRAMUSCULAR | Status: DC | PRN

## 2021-12-10 MED ORDER — ATORVASTATIN CALCIUM 40 MG PO TABS
40.0000 mg | ORAL_TABLET | Freq: Every day | ORAL | Status: DC
  Administered 2021-12-11 – 2021-12-16 (×6): 40 mg via ORAL
  Filled 2021-12-10 (×6): qty 1

## 2021-12-10 MED ORDER — PANTOPRAZOLE SODIUM 40 MG PO TBEC: 40.0000 mg | DELAYED_RELEASE_TABLET | Freq: Every day | ORAL | 1 refills | Status: DC

## 2021-12-10 MED ORDER — WARFARIN SODIUM 7.5 MG PO TABS: 7.5000 mg | ORAL_TABLET | Freq: Once | ORAL | Status: DC

## 2021-12-10 MED ORDER — PROCHLORPERAZINE MALEATE 5 MG PO TABS: 5.0000 mg | ORAL_TABLET | Freq: Four times a day (QID) | ORAL | Status: DC | PRN

## 2021-12-10 MED ORDER — BISACODYL 10 MG RE SUPP: 10.0000 mg | Freq: Every day | RECTAL | Status: DC | PRN

## 2021-12-10 MED ORDER — AMLODIPINE BESYLATE 10 MG PO TABS
10.0000 mg | ORAL_TABLET | Freq: Every day | ORAL | Status: DC
  Administered 2021-12-11 – 2021-12-16 (×6): 10 mg via ORAL
  Filled 2021-12-10 (×6): qty 1

## 2021-12-10 MED ORDER — ENOXAPARIN SODIUM 100 MG/ML IJ SOSY
1.0000 mg/kg | PREFILLED_SYRINGE | Freq: Two times a day (BID) | INTRAMUSCULAR | Status: DC
  Filled 2021-12-10: qty 0.95

## 2021-12-10 MED ORDER — INSULIN ASPART 100 UNIT/ML IJ SOLN
0.0000 [IU] | Freq: Every day | INTRAMUSCULAR | Status: DC
  Administered 2021-12-10: 2 [IU] via SUBCUTANEOUS

## 2021-12-10 MED ORDER — INSULIN ASPART 100 UNIT/ML IJ SOLN
0.0000 [IU] | Freq: Three times a day (TID) | INTRAMUSCULAR | Status: DC
  Administered 2021-12-10: 2 [IU] via SUBCUTANEOUS
  Administered 2021-12-11: 3 [IU] via SUBCUTANEOUS
  Administered 2021-12-11 – 2021-12-12 (×4): 1 [IU] via SUBCUTANEOUS
  Administered 2021-12-12: 2 [IU] via SUBCUTANEOUS
  Administered 2021-12-13 – 2021-12-14 (×5): 1 [IU] via SUBCUTANEOUS
  Administered 2021-12-14: 2 [IU] via SUBCUTANEOUS
  Administered 2021-12-15 – 2021-12-16 (×3): 1 [IU] via SUBCUTANEOUS

## 2021-12-10 MED ORDER — LEVETIRACETAM 500 MG PO TABS: 500.0000 mg | ORAL_TABLET | Freq: Two times a day (BID) | ORAL | 1 refills | Status: DC

## 2021-12-10 MED ORDER — POLYETHYLENE GLYCOL 3350 17 G PO PACK: 17.0000 g | PACK | Freq: Every day | ORAL | Status: DC | PRN

## 2021-12-10 MED ORDER — FLEET ENEMA 7-19 GM/118ML RE ENEM: 1.0000 | ENEMA | Freq: Once | RECTAL | Status: DC | PRN

## 2021-12-10 MED ORDER — WARFARIN SODIUM 2.5 MG PO TABS
7.5000 mg | ORAL_TABLET | Freq: Once | ORAL | Status: AC
  Administered 2021-12-10: 7.5 mg via ORAL
  Filled 2021-12-10: qty 3

## 2021-12-10 MED ORDER — CARVEDILOL 12.5 MG PO TABS
12.5000 mg | ORAL_TABLET | Freq: Two times a day (BID) | ORAL | Status: DC
  Administered 2021-12-10 – 2021-12-16 (×12): 12.5 mg via ORAL
  Filled 2021-12-10 (×12): qty 1

## 2021-12-10 MED ORDER — TRAZODONE HCL 50 MG PO TABS: 25.0000 mg | ORAL_TABLET | Freq: Every evening | ORAL | Status: DC | PRN

## 2021-12-10 MED ORDER — DIPHENHYDRAMINE HCL 12.5 MG/5ML PO ELIX: 12.5000 mg | ORAL_SOLUTION | Freq: Four times a day (QID) | ORAL | Status: DC | PRN

## 2021-12-10 MED ORDER — POLYETHYLENE GLYCOL 3350 17 G PO PACK
17.0000 g | PACK | Freq: Every day | ORAL | Status: DC
  Administered 2021-12-12 – 2021-12-16 (×2): 17 g via ORAL
  Filled 2021-12-10 (×6): qty 1

## 2021-12-10 MED ORDER — DOCUSATE SODIUM 50 MG/5ML PO LIQD
100.0000 mg | Freq: Two times a day (BID) | ORAL | Status: DC
  Administered 2021-12-11 – 2021-12-14 (×5): 100 mg via ORAL
  Filled 2021-12-10 (×9): qty 10

## 2021-12-10 MED ORDER — HYDROCODONE-ACETAMINOPHEN 5-325 MG PO TABS: 1.0000 | ORAL_TABLET | ORAL | Status: DC | PRN

## 2021-12-10 MED ORDER — ORAL CARE MOUTH RINSE: 15.0000 mL | OROMUCOSAL | Status: DC | PRN

## 2021-12-10 MED ORDER — ENOXAPARIN SODIUM 100 MG/ML IJ SOSY
1.0000 mg/kg | PREFILLED_SYRINGE | Freq: Two times a day (BID) | INTRAMUSCULAR | Status: DC
  Administered 2021-12-10 – 2021-12-15 (×10): 95 mg via SUBCUTANEOUS
  Filled 2021-12-10 (×10): qty 0.95

## 2021-12-10 MED ORDER — ALUM & MAG HYDROXIDE-SIMETH 200-200-20 MG/5ML PO SUSP: 30.0000 mL | ORAL | Status: DC | PRN

## 2021-12-10 MED ORDER — IRBESARTAN 300 MG PO TABS
300.0000 mg | ORAL_TABLET | Freq: Every day | ORAL | Status: DC
  Administered 2021-12-11 – 2021-12-16 (×6): 300 mg via ORAL
  Filled 2021-12-10 (×6): qty 1

## 2021-12-10 MED ORDER — PANTOPRAZOLE SODIUM 40 MG PO TBEC
40.0000 mg | DELAYED_RELEASE_TABLET | Freq: Every day | ORAL | Status: DC
  Administered 2021-12-11 – 2021-12-16 (×6): 40 mg via ORAL
  Filled 2021-12-10 (×6): qty 1

## 2021-12-10 MED ORDER — AMIODARONE HCL 200 MG PO TABS
200.0000 mg | ORAL_TABLET | Freq: Every day | ORAL | Status: DC
  Administered 2021-12-11 – 2021-12-16 (×6): 200 mg via ORAL
  Filled 2021-12-10 (×6): qty 1

## 2021-12-10 MED ORDER — WARFARIN - PHARMACIST DOSING INPATIENT: Freq: Every day | Status: DC

## 2021-12-10 MED ORDER — LEVETIRACETAM 500 MG PO TABS
500.0000 mg | ORAL_TABLET | Freq: Two times a day (BID) | ORAL | Status: DC
  Administered 2021-12-10 – 2021-12-16 (×12): 500 mg via ORAL
  Filled 2021-12-10 (×12): qty 1

## 2021-12-10 MED ORDER — ALENDRONATE SODIUM 70 MG PO TABS: 70.0000 mg | ORAL_TABLET | ORAL | 1 refills | Status: DC

## 2021-12-10 NOTE — Progress Notes (Signed)
[name], MD  Physician Physical Medicine and Rehabilitation   PMR Pre-admission    Signed   Date of Service: 12/06/2021  1:38 PM  Related encounter: ED to Hosp-Admission (Current) from 12/01/2021 in [facility]   Signed      Show:Clear all _0 Written_1 Templated_2 Copied  Added by: _3 Cristina Gong, RN_4 [name], MD  _5 Hover for details PMR Admission Coordinator Pre-Admission Assessment   Patient: [name] is an 66 y.o., female MRN: [id number] DOB: 04-Nov-1955 Height: _6  (167.6 cm) Weight: 95 kg   Insurance Information HMO: yes    PPO:      PCP:      IPA:      80/20:      OTHER:  PRIMARY: [employer]      Policy#: [id number]      Subscriber: pt CM Name: Dot      Phone#: [phone] ext [phone]     Fax#: [phone] Pre-Cert#: [id number]   approved for 7 days f/u with Loraine at ext [phone] same fax   Employer:  Benefits:  Phone #: [phone]     Name: 11/27 Eff. Date: 01/10/21     Deduct: none      Out of Pocket Max: $3400       CIR: $295 co pay per day days 1 until 6      SNF: no copay per day days 1 until 20; $196 co pay per day days 21 until 100 Outpatient: $10 to $20 per visit     Co-Pay: visits per medical neccesity Home Health: 100%      Co-Pay: visits pert medical neccesity DME: 80%     Co-Pay: 20% Providers: in network  SECONDARY: none      Policy#:      Phone#:    Development worker, community:       Phone#:    The Engineer, petroleum" for patients in Inpatient Rehabilitation Facilities with attached "Privacy Act [place] Records" was provided and verbally reviewed with: Family   Emergency Contact Information Contact Information       Name Relation Home Work Mobile    [address] Daughter [phone]   [phone]    Vetta, Couzens [phone]        Schorsch,Wilbert Spouse [phone]             Current Medical History  Patient Admitting Diagnosis: SDH   History of  Present Illness: 66 year old female with history of A fib, MVR on chronic coumadin, CKD stage IIIa, Type 2 DM, chronic diastolic heart failure whtro presented to [facility] ER on 12/01/21 with complaints of bleeding gums. Recent teeth removal on 11/10. CT head was performed given the fact she was on Coumadin and revealed acute SDH with 8 mm left to right midline shift, Denied recent head trauma or falls.    She was given Kcentra and transferred to [facility] on 11/23. Taken to OR on 11/24 for left frontotemporal craniotomy for evacuation of SDH. EVD removed on 11/25. On 11/27 patient transferred back to ICU for closer monitoring for 48 hours to resume heparin. Cardiology consulted due to MVR 2014. Continue Amio, Norvasc, Coreg, Avapro and Lipitor.    On 11/29 more aphasic and somnolent. Repeat CT scan and EEG.EEG with no seizures. To continue Keppra. Felt acute metabolic encephalopathy. CT scan stable. Neurosurgery felt stable. Began Coumadin on 11/30 with Heparin IV to bridge.   Complete NIHSS TOTAL: 13   Patient's medical record from [facility]  [place] has been reviewed by the rehabilitation admission coordinator and physician.   Past Medical History      Past Medical History:  Diagnosis Date   Abnormal chest CT     Anemia     Aortic atherosclerosis (HCC)     Asthma     Atrial enlargement, left      severe   Atrial fibrillation (HCC) 01/05/2011    Chronic persistent, failed DCCV    Atrial flutter (HCC)     Bronchospasm 1998   Cardiomegaly     Carotid bruit     Chronic diastolic congestive heart failure (HCC)     Diabetes mellitus      Type II   Ejection fraction < 50%      35-40%,    Heart murmur     History of blood transfusion     History of radiation therapy 03/18/19-04/15/19    endometrial - vaginal brachytherapy -  Dr. Sondra Come    Hypercholesterolemia     Hypertension     Junctional rhythm 09/14/2018    Noted on EKG   Left bundle branch block (LBBB) 09/14/2018    Noted on EKG   LVH  (left ventricular hypertrophy) 10/10/2016    Moderate, Noted on ECHO   Mitral regurgitation     Obesity     Obesity (BMI 30-39.9) 01/16/2012   Persistent atrial fibrillation ([place])     Polyp of rectum     Rheumatic fever 01/16/2012    Reported during childhood   S/P Maze operation for atrial fibrillation 02/15/2012    Complete biatrial lesion set using bipolar radiofrequency and cryothermy ablation with clipping of LA appendage   S/P mitral valve replacement with metallic valve 78/29/5621    2m Sorin [employer]omed[employer] Optiform mechanical prosthesis   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 Yes   Family History   family history includes Asthma in her mother; Diabetes in her mother; Hypertension in her brother and mother.   Current Medications   Current Facility-Administered Medications:    acetaminophen (TYLENOL) tablet 650 mg, 650 mg, Oral, Q6H PRN, 650 mg at 12/09/21 1214 **OR** acetaminophen (TYLENOL) suppository 650 mg, 650 mg, Rectal, Q6H PRN, Dawley, Troy C, DO   amiodarone (PACERONE) tablet 200 mg, 200 mg, Oral, Daily, Mannam, Praveen, MD, 200 mg at 12/10/21 0846   amLODipine (NORVASC) tablet 10 mg, 10 mg, Oral, Daily, Mannam, Praveen, MD, 10 mg at 12/10/21 0846   atorvastatin (LIPITOR) tablet 40 mg, 40 mg, Oral, Daily, Dawley, Troy C, DO, 40 mg at 12/10/21 0846   carvedilol (COREG) tablet 12.5 mg, 12.5 mg, Oral, BID, Mannam, Praveen, MD, 12.5 mg at 12/10/21 0846   docusate sodium (COLACE) capsule 100 mg, 100 mg, Oral, BID, Dawley, Troy C, DO, 100 mg at 12/10/21 0846   heparin ADULT infusion 100 units/mL (25000 units/25021m 1,150 Units/hr, Intravenous, Continuous, Dang, Thuy D,  RPH, Last Rate: 11.5 mL/hr at 12/09/21 1436, 1,150 Units/hr at 12/09/21 1436   hydrALAZINE (APRESOLINE) injection 10-40 mg, 10-40 mg, Intravenous, Q4H PRN, DesShearon Stallsahul P, PA-C  HYDROcodone-acetaminophen (NORCO/VICODIN) 5-325 MG per tablet 1 tablet, 1 tablet, Oral, Q4H PRN, Dawley, Troy C, DO   insulin aspart (novoLOG) injection 0-15 Units, 0-15 Units, Subcutaneous, TID WC, Dawley, Troy C, DO, 2 Units at 12/10/21 8413   insulin aspart (novoLOG) injection 0-5 Units, 0-5 Units, Subcutaneous, QHS, Dawley, Troy C, DO, 3 Units at 12/03/21 2111   irbesartan (AVAPRO) tablet 300 mg, 300 mg, Oral, Daily, Dawley, Troy C, DO, 300 mg at 12/10/21 0846   labetalol (NORMODYNE) injection 10-40 mg, 10-40 mg, Intravenous, Q10 min PRN, Dawley, Troy C, DO   levETIRAcetam (KEPPRA) tablet 500 mg, 500 mg, Oral, BID, Ventura Sellers, RPH, 500 mg at 12/10/21 0846   morphine (PF) 2 MG/ML injection 1-2 mg, 1-2 mg, Intravenous, Q2H PRN, Dawley, Troy C, DO   ondansetron (ZOFRAN) tablet 4 mg, 4 mg, Oral, Q6H PRN **OR** ondansetron (ZOFRAN) injection 4 mg, 4 mg, Intravenous, Q6H PRN, Dawley, Troy C, DO   Oral care mouth rinse, 15 mL, Mouth Rinse, PRN, Laqueta Jean, MD   pantoprazole (PROTONIX) EC tablet 40 mg, 40 mg, Oral, Daily, Ventura Sellers, RPH, 40 mg at 12/10/21 0846   polyethylene glycol (MIRALAX / GLYCOLAX) packet 17 g, 17 g, Oral, Daily, Mick Sell, PA-C, 17 g at 12/10/21 0846   promethazine (PHENERGAN) tablet 12.5-25 mg, 12.5-25 mg, Oral, Q4H PRN, Dawley, Theodoro Doing, DO   Warfarin - Pharmacist Dosing Inpatient, , Does not apply, q1600, [name], RPH   Patients Current Diet:  Diet Order                  Diet Carb Modified Fluid consistency: Thin; Room service appropriate? Yes with Assist  Diet effective now                       Precautions / Restrictions Precautions Precautions: Fall Precaution Comments: monitor O2, hovers around 89/90 on RA at rest Restrictions Weight Bearing  Restrictions: No    Has the patient had 2 or more falls or a fall with injury in the past year? Yes   Prior Activity Level Community (5-7x/wk): Independent and driving   Prior Functional Level Self Care: Did the patient need help bathing, dressing, using the toilet or eating? Independent   Indoor Mobility: Did the patient need assistance with walking from room to room (with or without device)? Independent   Stairs: Did the patient need assistance with internal or external stairs (with or without device)? Independent   Functional Cognition: Did the patient need help planning regular tasks such as shopping or remembering to take medications? Independent   Patient Information Are you of Hispanic, Latino/a,or Spanish origin?: A. No, not of Hispanic, Latino/a, or Spanish origin What is your race?: B. Black or African American Do you need or want an interpreter to communicate with a doctor or health care staff?: 9. Unable to respond   Patient's Response To:  Health Literacy and Transportation Is the patient able to respond to health literacy and transportation needs?: No Health Literacy - How often do you need to have someone help you when you read instructions, pamphlets, or other written material from your doctor or pharmacy?: Patient unable to respond In the past 12 months, has lack of transportation kept you from medical appointments or from getting medications?: No In the past 12 months, has lack of transportation kept you from meetings, work, or from getting things needed for daily living?: No   Home Assistive Devices /  Equipment Home Assistive Devices/Equipment: None Home Equipment: Shower seat   Prior Device Use: Indicate devices/aids used by the patient prior to current illness, exacerbation or injury? None of the above   Current Functional Level Cognition   Overall Cognitive Status: Impaired/Different from baseline Difficult to assess due to: Impaired communication Current  Attention Level: Focused Orientation Level: Other (comment) (UTA expressive aphasia) Following Commands: Follows one step commands with increased time, Follows one step commands inconsistently Safety/Judgement: Decreased awareness of safety, Decreased awareness of deficits General Comments: pt appears to have worsening expressive aphasia and word finding difficulty today in addition to receptive aphasia.pt able to follow all commands, only verbalizing yea and no throughout session    Extremity Assessment (includes Sensation/Coordination)   Upper Extremity Assessment: Defer to OT evaluation RUE Deficits / Details: delayed in comparison to LUE for shoulder flexion  Lower Extremity Assessment: Generalized weakness     ADLs   Overall ADL's : Needs assistance/impaired Eating/Feeding Details (indicate cue type and reason): Dtr reports yesterday she feed herself, today she had to feed her with the only thing the patient did was initiate holding cup to drink from and shook head yes/no to if she wanted what dtr was trying to feed her. Grooming: Oral care, Standing, Maximal assistance Grooming Details (indicate cue type and reason): max assist including setup of supplies, applying toothpaste, and completing steps for task Upper Body Bathing: Maximal assistance, Sitting Upper Body Bathing Details (indicate cue type and reason): EOB Lower Body Bathing: Maximal assistance Lower Body Bathing Details (indicate cue type and reason): Mod A sit<>stand Upper Body Dressing : Total assistance, Sitting Upper Body Dressing Details (indicate cue type and reason): EOB Lower Body Dressing: Moderate assistance Lower Body Dressing Details (indicate cue type and reason): Mod A sit<>stand Toilet Transfer: Minimal assistance, Ambulation, BSC/3in1 Toilet Transfer Details (indicate cue type and reason): hand held assist to the 3:1 over the toilet Toileting- Clothing Manipulation and Hygiene: Moderate assistance, Sit  to/from stand Toileting - Clothing Manipulation Details (indicate cue type and reason): clothing management Functional mobility during ADLs: Minimal assistance (no assistive device) General ADL Comments: Pt demonstrates significant motor planning deficits when attempting to sequence through oral hygiene.  She needed hand over hand assist to apply toothpaste on the toothbrush secondary to not comprehending and instead continued to try and put the toothbrush in her mouth without it.  When toothbrush was removed from her right hand in order to have her work on opening toothpaste, she instead tried to then put her finger in her mouth.     Mobility   Overal bed mobility: Needs Assistance Bed Mobility: Supine to Sit, Sit to Supine Supine to sit: Min assist Sit to supine: Min assist General bed mobility comments: min guard for safety light assist to manage LEs, pt with improved sequencing     Transfers   Overall transfer level: Needs assistance Equipment used: 1 person hand held assist Transfers: Sit to/from Stand Sit to Stand: Min assist General transfer comment: multimodal cues to initiate with anterior weight shift     Ambulation / Gait / Stairs / Wheelchair Mobility   Ambulation/Gait Ambulation/Gait assistance: Min assist, Counsellor (Feet): 150 Feet Assistive device: IV Pole Gait Pattern/deviations: Decreased step length - right, Decreased step length - left, Wide base of support, Drifts right/left General Gait Details: multimodal directional cues to navigate hallway.pt able to push IV pole with both hands and maintain close to center of hall without bumping into obstacels Gait velocity:  slowed Gait velocity interpretation: <1.31 ft/sec, indicative of household ambulator     Posture / Balance Balance Overall balance assessment: Needs assistance Sitting-balance support: No upper extremity supported, Feet supported Sitting balance-Leahy Scale: Good Standing balance support:  Bilateral upper extremity supported Standing balance-Leahy Scale: Poor Standing balance comment: dependent on external support     Special needs/care consideration Fall and seizure precautions    Previous Home Environment  Living Arrangements: Spouse/significant other, Children (and adult son)  Lives With: Son Available Help at Discharge: Family, Available 24 hours/day (daughter, Elwin Sleight can take FMLA) Type of Home: House Home Layout: One level Home Access: Stairs to enter [employer] of Steps: 2 and 1 (no rails) Bathroom Shower/Tub: Tub/shower unit, Architectural technologist: Standard Bathroom Accessibility: Yes How Accessible: Accessible via walker Home Care Services: No Additional Comments: spouse currently leaves the house to take care of his mother   Discharge Living Setting Plans for Discharge Living Setting: Patient's home, Lives with (comment) (spouse and adult son) Type of Home at Discharge: House Discharge Home Layout: One level Discharge Home Access: Stairs to enter Entrance Stairs-Rails: None Entrance Stairs-Number of Steps: 2 and 1 Discharge Bathroom Shower/Tub: Tub/shower unit Discharge Bathroom Toilet: Standard Discharge Bathroom Accessibility: Yes How Accessible: Accessible via walker Does the patient have any problems obtaining your medications?: No   Social/Family/Support Systems Patient Roles: Parent Contact Information: spouse and daughter Anticipated Caregiver: spouse and daughter Anticipated Caregiver's Contact Information: see contacts Ability/Limitations of Caregiver: daughter can take FMLA Caregiver Availability: 24/7 Discharge Plan Discussed with Primary Caregiver: Yes Is Caregiver In Agreement with Plan?: Yes Does Caregiver/Family have Issues with Lodging/Transportation while Pt is in Rehab?: Yes   Goals Patient/Family Goal for Rehab: supervision to min assist with PT, OT and SLP Expected length of stay: ELOS 10 to 14 days Pt/Family  Agrees to Admission and willing to participate: Yes Program Orientation Provided & Reviewed with Pt/Caregiver Including Roles  & Responsibilities: Yes   Decrease burden of Care through IP rehab admission: n/a   Possible need for SNF placement upon discharge: not anticipated   Patient Condition: I have reviewed medical records from [facility], spoken with  patient and daughter. I met with patient at the bedside for inpatient rehabilitation assessment.  Patient will benefit from ongoing PT, OT, and SLP, can actively participate in 3 hours of therapy a day 5 days of the week, and can make measurable gains during the admission.  Patient will also benefit from the coordinated team approach during an Inpatient Acute Rehabilitation admission.  The patient will receive intensive therapy as well as Rehabilitation physician, nursing, social worker, and care management interventions.  Due to bladder management, bowel management, safety, skin/wound care, disease management, medication administration, pain management, and patient education the patient requires 24 hour a day rehabilitation nursing.  The patient is currently min to mod assist overall with mobility and basic ADLs.  Discharge setting and therapy post discharge at home with home health is anticipated.  Patient has agreed to participate in the Acute Inpatient Rehabilitation Program and will admit today.   Preadmission Screen Completed By:  Cleatrice Burke, 12/10/2021 10:48 AM ______________________________________________________________________   Discussed status with Dr. Naaman Plummer on 12/1 at 1053 and received approval for admission today.   Admission Coordinator:  Cleatrice Burke, RN, time 8889 Date 12/10/21    Assessment/Plan: Diagnosis: SDH Does the need for close, 24 hr/day Medical supervision in concert with the patient's rehab needs make it unreasonable for this patient to be  served in a less intensive setting?  Yes Co-Morbidities requiring supervision/potential complications: afib, MVR on a/c, dm, cdCHF Due to bladder management, bowel management, safety, skin/wound care, disease management, medication administration, pain management, and patient education, does the patient require 24 hr/day rehab nursing? Yes Does the patient require coordinated care of a physician, rehab nurse, PT, OT, and SLP to address physical and functional deficits in the context of the above medical diagnosis(es)? Yes Addressing deficits in the following areas: balance, endurance, locomotion, strength, transferring, bowel/bladder control, bathing, dressing, feeding, grooming, toileting, cognition, speech, swallowing, and psychosocial support Can the patient actively participate in an intensive therapy program of at least 3 hrs of therapy 5 days a week? Yes The potential for patient to make measurable gains while on inpatient rehab is excellent Anticipated functional outcomes upon discharge from inpatient rehab: supervision and min assist PT, supervision and min assist OT, supervision and min assist SLP Estimated rehab length of stay to reach the above functional goals is: 10-14 days Anticipated discharge destination: Home 10. Overall Rehab/Functional Prognosis: excellent     MD Signature: [name], MD, [place] Director Rehabilitation Services 12/10/2021          Revision History

## 2021-12-10 NOTE — Progress Notes (Signed)
   Providing Compassionate, Quality Care - Together  NEUROSURGERY PROGRESS NOTE   S: No issues overnight.   O: EXAM:  BP (!) 149/68 (BP Location: Right Arm)   Pulse 87   Temp 98 F (36.7 C) (Oral)   Resp 20   Ht '5\' 6"'$  (1.676 m)   Wt 95 kg   SpO2 97%   BMI 33.80 kg/m   Awake, alert PERRL Expressive and receptive aphasia CNs grossly intact  MAE equally  Incision c/d/i   ASSESSMENT:  66 y.o. 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  Please do not hesitate to call with questions or concerns.   Elwin Sleight, [facility] Cell: [phone]

## 2021-12-10 NOTE — H&P (Signed)
Physical Medicine and Rehabilitation Admission H&P    Chief Complaint  Patient presents with   Functional deficits due to acute on chronic SDH    HPI:  [name] is a 66 year old female with hx of CAF, OSA, T2DM, CKD, MVR-chronic coumadin, hx post op L-MCA stroke 2014, dental extractions 11/19/21 with ongoing bleeding and admitted via ED on 12/02/21 with bleeding gums since procedure and reports of HA followed by somnolence and confusion. CT head done revealing acute on chronic ADH with 16 mm shift on the left and 6 mm shift on the right, 8 mm left to right midline shift and effacement of left greater than right sulci. INR  2.4 at admission and reversed with [facility]. Dr.Chandreskar consulted and recommended BP management and trial of heparin once cleared for [facility]. She started having difficulty with speech.  Dr. Reatha Armour consulted and patient underwent Left frontotemporal craniotomy for evacuation of hematoma on 11/24 and on Keppra for seizure prophylaxis.  Post op with low UOP requiring IVF and EVD removed on 11/25 as follow up CT stable. She was cleared to start IV heparin on 11/27 with recommendations to repeat CT once heparin therapeutic before transition to oral AC.   Patient with aphasia with paraphrasias, perseveration, delay in processing, balance deficits with left neglect and  tendency to vear to there right with ambulation. On 11/29, she had significant worsening of aphasia, problems with ADLs due to motor planning deficits, decreased lability to follow commands and more of a left bias with left gaze preference. EEG done showing area of epileptogenicity arising from left frontal region with cortical dysfunction in left hemisphere. Repeat CT head showed mixed density left frontal parietal and temporal lobe 10 mm and unchanged and new small right temporal SDH unchanged. She was cleared to resume coumadin and INR 1.1 today. She has had improvement in motor planning with no overt left neglect  or bias per PT notes from yesterday. Speech deficits felt to be due to sleep disruption? Therapy has been working with patient who continues to be limited by lethargy, motor planning deficits, continues to have minimal verbalization and decrease in carryover. CIR recommended due to functional decline.    Review of Systems  Unable to perform ROS: Mental acuity     Past Medical History:  Diagnosis Date   Abnormal chest CT    Anemia    Aortic atherosclerosis (HCC)    Asthma    Atrial enlargement, left    severe   Atrial fibrillation (HCC) 01/05/2011   Chronic persistent, failed DCCV    Atrial flutter (HCC)    Bronchospasm 1998   Cardiomegaly    Carotid bruit    Chronic diastolic congestive heart failure (HCC)    Diabetes mellitus    Type II   Ejection fraction < 50%    35-40%,    Heart murmur    History of blood transfusion    History of radiation therapy 03/18/19-04/15/19   endometrial - vaginal brachytherapy -  Dr. Sondra Come    Hypercholesterolemia    Hypertension    Junctional rhythm 09/14/2018   Noted on EKG   Left bundle branch block (LBBB) 09/14/2018   Noted on EKG   LVH (left ventricular hypertrophy) 10/10/2016   Moderate, Noted on ECHO   Mitral regurgitation    Obesity    Obesity (BMI 30-39.9) 01/16/2012   Persistent atrial fibrillation (HCC)    Polyp of rectum    Rheumatic fever 01/16/2012   Reported  during childhood   S/P Maze operation for atrial fibrillation 02/15/2012   Complete biatrial lesion set using bipolar radiofrequency and cryothermy ablation with clipping of LA appendage   S/P mitral valve replacement with metallic valve 24/26/8341   57m Sorin [employer] Optiform mechanical prosthesis   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   233mEdwards mc3 ring annuloplasty   Shortness of breath    with exertion   Sleep apnea    DOES NOT HAVE CPAP   [place]   Post op , left arm weakness   Tricuspid regurgitation 01/16/2012   [facility]    Past Surgical History:  Procedure Laterality Date   CARDIAC CATHETERIZATION  >5 years   CARDIOVASCULAR STRESS TEST  09/2010   CARDIOVERSION  03/25/2011   Procedure: CARDIOVERSION;  Surgeon: JaJettie BoozeMD;  [place] [facility] Service: Cardiovascular;  Laterality: N/A;   CARDIOVERSION N/A 07/27/2012   Procedure: CARDIOVERSION;  Surgeon: JaJettie BoozeMD;  [place] [facility] Service: Cardiovascular;  Laterality: N/A;   CESAREAN SECTION     COLONOSCOPY WITH PROPOFOL N/A 04/27/2016   Procedure: COLONOSCOPY WITH PROPOFOL;  Surgeon: ViWilford CornerMD;  [facility];  Service: Endoscopy;  Laterality: N/A;   CRANIOTOMY Left 12/03/2021   Procedure: FRONTOTEMPORAL CRANIOTOMY FOR EVACUATION SUBDURAL HEMATOMA;  Surgeon: DaKarsten RoDO;  [place] [facility] Service: Neurosurgery;  Laterality: Left;   INTRAOPERATIVE TRANSESOPHAGEAL ECHOCARDIOGRAM  02/15/2012   Procedure: INTRAOPERATIVE TRANSESOPHAGEAL ECHOCARDIOGRAM;  Surgeon: ClRexene AlbertsMD;  [place] Service: Open Heart Surgery;  Laterality: N/A;   MAZE  02/15/2012   Procedure: MAZE;  Surgeon: ClRexene AlbertsMD;  [place] [facility] Service: Open Heart Surgery;  Laterality: N/A;   MITRAL VALVE REPLACEMENT  02/15/2012   Procedure: MITRAL VALVE (MV) REPLACEMENT;  Surgeon: ClRexene AlbertsMD;  Location: [facility] Service: Open Heart Surgery;  Laterality: N/A;   ROBOTIC ASSISTED LAPAROSCOPIC HYSTERECTOMY AND SALPINGECTOMY Bilateral 01/02/2019   Procedure: XI ROBOTIC ASSISTED LAPAROSCOPIC TOTAL HYSTERECTOMY WITH BILATERAL SALPINGOOPHORECTOMY, SENTINEL LYMPH NODE BIOPSY;  Surgeon: TuLafonda MossesMD;  Location: [facility]S;  Service: Gynecology;  Laterality: Bilateral;   TEE WITHOUT CARDIOVERSION  12/21/2011   Procedure: TRANSESOPHAGEAL ECHOCARDIOGRAM (TEE);  Surgeon: JaJettie BoozeMD;  [place] Service: Cardiovascular;  Laterality: N/A;   TOOTH EXTRACTION N/A 11/19/2021    Procedure: DENTAL EXTRACTION TEETH NUMBER 14, 19, 30, 31;  Surgeon: JeDiona BrownerDMD;  [place] [facility] [place] Service: Oral Surgery;  Laterality: N/A;   TRICUSPID VALVE REPLACEMENT  02/15/2012   Procedure: TRICUSPID VALVE REPAIR;  Surgeon: ClRexene AlbertsMD;  Location: [facility] Service: Open Heart Surgery;  Laterality: N/A;   TUBAL LIGATION     at time of her c-section    Family History  Problem Relation Age of Onset   Asthma Mother    Diabetes Mother    Hypertension Mother    Hypertension Brother    Heart attack Neg Hx    Stroke Neg Hx    Colon cancer Neg Hx    Colon polyps Neg Hx    Liver disease Neg Hx    Uterine cancer Neg Hx    Ovarian cancer Neg Hx    Breast cancer Neg Hx     Social History:  reports that she has never smoked. She has never used smokeless tobacco. She reports that she does not drink alcohol and does not use drugs.  Allergies  Allergen Reactions   Chlorhexidine Other (See Comments)    Unknown    Medications Prior to Admission  Medication Sig Dispense Refill   [START ON 12/13/2021] alendronate (FOSAMAX) 70 MG tablet Take 1 tablet (70 mg total) by mouth once a week. Take with a full glass of water on an empty stomach. 4 tablet 1   amiodarone (PACERONE) 200 MG tablet TAKE 1 TABLET(200 MG) BY MOUTH DAILY (Patient not taking: Reported on 12/02/2021) 90 tablet 2   amLODipine (NORVASC) 10 MG tablet TAKE 1 TABLET EVERY DAY 90 tablet 3   atorvastatin (LIPITOR) 40 MG tablet TAKE 1 TABLET(40 MG) BY MOUTH DAILY (Patient taking differently: Take 40 mg by mouth daily.) 90 tablet 2   carvedilol (COREG) 12.5 MG tablet TAKE 1 TABLET TWICE DAILY WITH MEALS 180 tablet 3   enoxaparin (LOVENOX) 100 MG/ML injection Inject 1 mL (100 mg total) into the skin every 12 (twelve) hours. 10 mL 0   HYDROcodone-acetaminophen (NORCO) 5-325 MG tablet Take 1 tablet by mouth every 4 (four) hours as needed for moderate pain. 18 tablet 0   irbesartan (AVAPRO) 300 MG tablet TAKE 1 TABLET EVERY  DAY 90 tablet 3   levETIRAcetam (KEPPRA) 500 MG tablet Take 1 tablet (500 mg total) by mouth 2 (two) times daily. 60 tablet 1   metFORMIN (GLUCOPHAGE) 500 MG tablet Take 500 mg by mouth 2 (two) times daily with a meal.      [START ON 12/11/2021] pantoprazole (PROTONIX) 40 MG tablet Take 1 tablet (40 mg total) by mouth daily. 30 tablet 1   warfarin (COUMADIN) 4 MG tablet TAKE 1 AND 1/2 TABLETS TO 2 TABLETS BY MOUTH DAILY AS DIRECTED BY COUMADIN CLINIC (Patient taking differently: Take 6 mg by mouth at bedtime.) 180 tablet 1      Home: Home Living Family/patient expects to be discharged to:: Inpatient rehab Living Arrangements: Spouse/significant other, Children (and adult son) Available Help at Discharge: Family, Available 24 hours/day (daughter, Elwin Sleight can take FMLA) Type of Home: House Home Access: Stairs to enter [employer] of Steps: 2 and 1 (no rails) Home Layout: One level Bathroom Shower/Tub: Tub/shower unit, Architectural technologist: Standard Bathroom Accessibility: Yes Home Equipment: Shower seat Additional Comments: spouse currently leaves the house to take care of his mother  Lives With: Son   Functional History: Prior Function Prior Level of Function : Independent/Modified Independent, Driving Mobility Comments: Independent ADLs Comments: Independent  Functional Status:  Mobility: Bed Mobility Overal bed mobility: Needs Assistance Bed Mobility: Supine to Sit, Sit to Supine Supine to sit: Min assist Sit to supine: Min assist General bed mobility comments: min guard for safety light assist to manage LEs, pt with improved sequencing Transfers Overall transfer level: Needs assistance Equipment used: 1 person hand held assist Transfers: Sit to/from Stand Sit to Stand: Min assist General transfer comment: multimodal cues to initiate with anterior weight shift Ambulation/Gait Ambulation/Gait assistance: Min assist, Min guard Gait Distance (Feet): 150  Feet Assistive device: IV Pole Gait Pattern/deviations: Decreased step length - right, Decreased step length - left, Wide base of support, Drifts right/left General Gait Details: multimodal directional cues to navigate hallway.pt able to push IV pole with both hands and maintain close to center of hall without bumping into obstacels Gait velocity: slowed Gait velocity interpretation: <1.31 ft/sec, indicative of household ambulator    ADL: ADL Overall ADL's : Needs assistance/impaired Eating/Feeding Details (indicate cue type and reason): Dtr reports yesterday she feed herself, today she had to  feed her with the only thing the patient did was initiate holding cup to drink from and shook head yes/no to if she wanted what dtr was trying to feed her. Grooming: Oral care, Standing, Maximal assistance Grooming Details (indicate cue type and reason): max assist including setup of supplies, applying toothpaste, and completing steps for task Upper Body Bathing: Maximal assistance, Sitting Upper Body Bathing Details (indicate cue type and reason): EOB Lower Body Bathing: Maximal assistance Lower Body Bathing Details (indicate cue type and reason): Mod A sit<>stand Upper Body Dressing : Total assistance, Sitting Upper Body Dressing Details (indicate cue type and reason): EOB Lower Body Dressing: Moderate assistance Lower Body Dressing Details (indicate cue type and reason): Mod A sit<>stand Toilet Transfer: Minimal assistance, Ambulation, BSC/3in1 Toilet Transfer Details (indicate cue type and reason): hand held assist to the 3:1 over the toilet Toileting- Clothing Manipulation and Hygiene: Moderate assistance, Sit to/from stand Toileting - Clothing Manipulation Details (indicate cue type and reason): clothing management Functional mobility during ADLs: Minimal assistance (no assistive device) General ADL Comments: Pt demonstrates significant motor planning deficits when attempting to sequence  through oral hygiene.  She needed hand over hand assist to apply toothpaste on the toothbrush secondary to not comprehending and instead continued to try and put the toothbrush in her mouth without it.  When toothbrush was removed from her right hand in order to have her work on opening toothpaste, she instead tried to then put her finger in her mouth.  Cognition: Cognition Overall Cognitive Status: Impaired/Different from baseline Orientation Level: Other (comment) (UTA expressive aphasia) Cognition Arousal/Alertness: Awake/alert Behavior During Therapy: Flat affect Overall Cognitive Status: Impaired/Different from baseline Area of Impairment: Following commands, Safety/judgement, Awareness, Problem solving, Attention, Memory, Orientation Orientation Level: Disoriented to (slow to respond, states only first name when asked name, verbal cues for last name, unable to state hospital but when asked if she was in a restaurant she responded no, then responded yes to hospital) Current Attention Level: Focused Memory: Decreased short-term memory Following Commands: Follows one step commands with increased time, Follows one step commands inconsistently Safety/Judgement: Decreased awareness of safety, Decreased awareness of deficits Awareness: Intellectual Problem Solving: Slow processing, Decreased initiation, Difficulty sequencing, Requires verbal cues, Requires tactile cues General Comments: pt appears to have worsening expressive aphasia and word finding difficulty today in addition to receptive aphasia.pt able to follow all commands, only verbalizing yea and no throughout session Difficult to assess due to: Impaired communication   Blood pressure (!) 105/54, pulse 69, temperature 99.2 F (37.3 C), temperature source Oral, resp. rate 18, height '5\' 6"'$  (1.676 m), weight 95 kg, SpO2 94 %. Physical Exam Vitals and nursing note reviewed.  Constitutional:      Comments: Left crani incision C/D/I with  staples in place. Tender to touch.   HENT:     Nose: Nose normal.     Mouth/Throat:     Mouth: Mucous membranes are moist.  Eyes:     General: No scleral icterus.    Extraocular Movements: Extraocular movements intact.     Pupils: Pupils are equal, round, and reactive to light.  Cardiovascular:     Rate and Rhythm: Normal rate and regular rhythm.     Pulses: Normal pulses.     Heart sounds: Murmur heard.  Pulmonary:     Effort: Pulmonary effort is normal. No respiratory distress.     Breath sounds: Normal breath sounds. No wheezing.  Abdominal:     General: Bowel sounds are normal. There  is no distension.     Palpations: Abdomen is soft.     Tenderness: There is no abdominal tenderness.  Musculoskeletal:     Cervical back: Normal range of motion.  Skin:    General: Skin is warm and dry.  Neurological:     Mental Status: She is alert.     Comments: Pt alert, makes good eye contact. Right central 7. Able to verbalize on limited basis. Told me she lived in [place] when given choices.Able to tell me daughter's name when I asked.  Exp>rec language deficits with verbal and motor apraxia noted. Could touch back of head with left hand but could not coordinate right hand to do so. Moves all 4's, left preferentially to right. Senses light touch and pain in all 4's. No resting tone.      Psychiatric:     Comments: Very flat, but engages when stimulated.      Results for orders placed or performed during the hospital encounter of 12/01/21 (from the past 48 hour(s))  Glucose, capillary     Status: Abnormal   Collection Time: 12/08/21  5:16 PM  Result Value Ref Range   Glucose-Capillary 118 (H) 70 - 99 mg/dL    Comment: Glucose reference range applies only to samples taken after fasting for at least 8 hours.  Heparin level (unfractionated)     Status: None   Collection Time: 12/08/21  8:54 PM  Result Value Ref Range   Heparin Unfractionated 0.40 0.30 - 0.70 IU/mL    Comment: (NOTE) The  clinical reportable range upper limit is being lowered to >1.10 to align with the FDA approved guidance for the current laboratory assay.  If heparin results are below expected values, and patient dosage has  been confirmed, suggest follow up testing of antithrombin III levels. Performed at [facility], [place] [address] [place], [place] [address]   Glucose, capillary     Status: Abnormal   Collection Time: 12/08/21  9:33 PM  Result Value Ref Range   Glucose-Capillary 158 (H) 70 - 99 mg/dL    Comment: Glucose reference range applies only to samples taken after fasting for at least 8 hours.  Glucose, capillary     Status: Abnormal   Collection Time: 12/09/21  3:13 AM  Result Value Ref Range   Glucose-Capillary 148 (H) 70 - 99 mg/dL    Comment: Glucose reference range applies only to samples taken after fasting for at least 8 hours.  CBC     Status: Abnormal   Collection Time: 12/09/21  7:43 AM  Result Value Ref Range   WBC 6.3 4.0 - 10.5 K/uL   RBC 2.74 (L) 3.87 - 5.11 MIL/uL   Hemoglobin 8.7 (L) 12.0 - 15.0 g/dL   HCT 26.9 (L) 36.0 - 46.0 %   MCV 98.2 80.0 - 100.0 fL   MCH 31.8 26.0 - 34.0 pg   MCHC 32.3 30.0 - 36.0 g/dL   RDW 12.8 11.5 - 15.5 %   Platelets 168 150 - 400 K/uL   nRBC 0.0 0.0 - 0.2 %    Comment: Performed at [facility], [place] [address], [place], [place] [address]  Heparin level (unfractionated)     Status: None   Collection Time: 12/09/21  7:43 AM  Result Value Ref Range   Heparin Unfractionated 0.39 0.30 - 0.70 IU/mL    Comment: (NOTE) The clinical reportable range upper limit is being lowered to >1.10 to align with the FDA approved guidance  for the current laboratory assay.  If heparin results are below expected values, and patient dosage has  been confirmed, suggest follow up testing of antithrombin III levels. Performed at [facility], [place] [address], [place], [place] [address]   Glucose, capillary     Status: Abnormal   Collection  Time: 12/09/21  8:02 AM  Result Value Ref Range   Glucose-Capillary 153 (H) 70 - 99 mg/dL    Comment: Glucose reference range applies only to samples taken after fasting for at least 8 hours.   Comment 1 Notify RN    Comment 2 Document in Chart   Glucose, capillary     Status: Abnormal   Collection Time: 12/09/21 11:06 AM  Result Value Ref Range   Glucose-Capillary 124 (H) 70 - 99 mg/dL    Comment: Glucose reference range applies only to samples taken after fasting for at least 8 hours.   Comment 1 Notify RN    Comment 2 Document in Chart   Protime-INR     Status: None   Collection Time: 12/09/21  2:00 PM  Result Value Ref Range   Prothrombin Time 14.2 11.4 - 15.2 seconds   INR 1.1 0.8 - 1.2    Comment: (NOTE) INR goal varies based on device and disease states. Performed at [facility], [place] [address], [place], [place] [address]   Glucose, capillary     Status: Abnormal   Collection Time: 12/09/21  3:50 PM  Result Value Ref Range   Glucose-Capillary 178 (H) 70 - 99 mg/dL    Comment: Glucose reference range applies only to samples taken after fasting for at least 8 hours.   Comment 1 Notify RN    Comment 2 Document in Chart   Glucose, capillary     Status: Abnormal   Collection Time: 12/09/21  9:04 PM  Result Value Ref Range   Glucose-Capillary 145 (H) 70 - 99 mg/dL    Comment: Glucose reference range applies only to samples taken after fasting for at least 8 hours.   Comment 1 Notify RN    Comment 2 Document in Chart   CBC     Status: Abnormal   Collection Time: 12/10/21  3:18 AM  Result Value Ref Range   WBC 8.4 4.0 - 10.5 K/uL   RBC 2.97 (L) 3.87 - 5.11 MIL/uL   Hemoglobin 9.4 (L) 12.0 - 15.0 g/dL   HCT 28.9 (L) 36.0 - 46.0 %   MCV 97.3 80.0 - 100.0 fL   MCH 31.6 26.0 - 34.0 pg   MCHC 32.5 30.0 - 36.0 g/dL   RDW 13.1 11.5 - 15.5 %   Platelets 179 150 - 400 K/uL   nRBC 0.0 0.0 - 0.2 %    Comment: Performed at [facility], [place] [address],  [place], [place] [address]  Heparin level (unfractionated)     Status: None   Collection Time: 12/10/21  3:18 AM  Result Value Ref Range   Heparin Unfractionated 0.33 0.30 - 0.70 IU/mL    Comment: (NOTE) The clinical reportable range upper limit is being lowered to >1.10 to align with the FDA approved guidance for the current laboratory assay.  If heparin results are below expected values, and patient dosage has  been confirmed, suggest follow up testing of antithrombin III levels. Performed at [facility], [place] [address], [place], [place] [address]   Protime-INR     Status: None   Collection Time: 12/10/21  3:18 AM  Result Value Ref Range   Prothrombin Time 14.3 11.4 - 15.2 seconds   INR 1.1 0.8 - 1.2    Comment: (NOTE) INR goal varies based on device and disease states. Performed at [facility], [place] [address] [place], [place] [address]   Glucose, capillary     Status: Abnormal   Collection Time: 12/10/21  6:35 AM  Result Value Ref Range   Glucose-Capillary 138 (H) 70 - 99 mg/dL    Comment: Glucose reference range applies only to samples taken after fasting for at least 8 hours.   Comment 1 Notify RN    Comment 2 Document in Chart   Glucose, capillary     Status: Abnormal   Collection Time: 12/10/21 11:17 AM  Result Value Ref Range   Glucose-Capillary 177 (H) 70 - 99 mg/dL    Comment: Glucose reference range applies only to samples taken after fasting for at least 8 hours.   EEG adult  Result Date: 12/08/2021 Lora Havens, MD     12/08/2021  5:54 PM Patient Name: SHATIA SINDONI MRN: [id number] Epilepsy Attending: Lora Havens Referring Physician/Provider: Karsten Ro, DO Date: 12/08/2021 Duration: 28.38 mins Patient history: 66yo F with left SDH. EEG to evaluate for seizure. Level of alertness:  awake AEDs during EEG study: LEV Technical aspects: This EEG study was done with scalp electrodes positioned according to the 10-20 International system of  electrode placement. Electrical activity was reviewed with band pass filter of 1-'70Hz'$ , sensitivity of 7 uV/mm, display speed of 46m/sec with a '60Hz'$  notched filter applied as appropriate. EEG data were recorded continuously and digitally stored.  Video monitoring was available and reviewed as appropriate. Description: No posterior dominant rhythm was seen. EEG showed continuous generalized and lateralized left hemisphere 2-'3hz'$  delta slowing admixed with 5-'6hz'$  theta slowing in right hemisphere. Sharp waves were noted in left frontal region.  Hyperventilation and photic stimulation were not performed.   ABNORMALITY -Sharp wave, left frontal region - Continuous slow, generalized and lateralized left hemisphere IMPRESSION: This study showed evidence of epileptogenicity arising from left frontal region. Additionally there is cortical dysfunction arising from left hemisphere likely secondary to underlying structural abnormality. Lastly there is moderate diffuse encephalopathy, nonspecific etiology. No seizures were seen throughout the recording. Priyanka O Yadav      Blood pressure (!) 105/54, pulse 69, temperature 99.2 F (37.3 C), temperature source Oral, resp. rate 18, height '5\' 6"'$  (1.676 m), weight 95 kg, SpO2 94 %.  Medical Problem List and Plan: 1. Functional deficits secondary to spontaneous bilateral SDH d/t anticoagulation. Pt is s/p left fronto-temporal craniotomy on 11/24.   -patient may shower  -ELOS/Goals: 10-14 days, supervision to min assist goals with PT, OT, SLP 2.  Mechanical mitral valve/Antithrombotics: -DVT/anticoagulation:  Pharmaceutical: Coumadin and Lovenox bridge till INR therapeutic.   -antiplatelet therapy: N/A 3. Pain Management:  Tylenol prn.  4. Mood/Behavior/Sleep: LCSW to follow for evaluation and support.   -antipsychotic agents: N/A 5. Neuropsych/cognition: This patient is not capable of making decisions on her own behalf. 6. Skin/Wound Care: Routine pressure relief  measures.  7. Fluids/Electrolytes/Nutrition: Monitor I/O. Will need assistance with meals.   -pt eating with cues --Check CMET on 12/04 8. H/o A fib: Monitor HR TID. Continue amiodarone and coreg BID 9. T2DM: Hgb A1c- 5.8 and well controlled on SSI at this time  --was on metformin PTA.  10 ABLA: Monitor for signs of bleeding. Has had intermittent drop in platelets.  --  Recheck CBC on 12/04  11. Seizure prophylaxis: Continues on Keppra 500  mg BID.   --monitor for seizures.  12. HTN: Monitor BP TID--on coreg, Norvasc and Avapro 13. Urinary retention:  Has been incontinent of urine. Toilet patient while awake.  --Per records cathed for 800 cc on 11/29. Monitor voiding with PVR checks.        Bary Leriche, PA-C 12/10/2021

## 2021-12-10 NOTE — H&P (Signed)
Physical Medicine and Rehabilitation Admission H&P        Chief Complaint  Patient presents with   Functional deficits due to acute on chronic SDH      HPI:  [name] is a 66 year old female with hx of CAF, OSA, T2DM, CKD, MVR-chronic coumadin, hx post op L-MCA stroke 2014, dental extractions 11/19/21 with ongoing bleeding and admitted via ED on 12/02/21 with bleeding gums since procedure and reports of HA followed by somnolence and confusion. CT head done revealing acute on chronic SDH with 16 mm shift on the left and 6 mm shift on the right, 8 mm left to right midline shift and effacement of left greater than right sulci. INR  2.4 at admission and reversed with [facility]. Dr.Chandreskar consulted and recommended BP management and trial of heparin once cleared for [facility]. She started having difficulty with speech.  Dr. Reatha Armour consulted and patient underwent Left frontotemporal craniotomy for evacuation of hematoma on 11/24 and on Keppra for seizure prophylaxis.  Post op with low UOP requiring IVF and EVD removed on 11/25 as follow up CT stable. She was cleared to start IV heparin on 11/27 with recommendations to repeat CT once heparin therapeutic before transition to oral AC.    Patient with aphasia with paraphrasias, perseveration, delay in processing, balance deficits with left neglect and  tendency to vear to there right with ambulation. On 11/29, she had significant worsening of aphasia, problems with ADLs due to motor planning deficits, decreased lability to follow commands and more of a left bias with left gaze preference. EEG done showing area of epileptogenicity arising from left frontal region with cortical dysfunction in left hemisphere. Repeat CT head showed mixed density left frontal parietal and temporal lobe 10 mm and unchanged and new small right temporal SDH unchanged. She was cleared to resume coumadin and INR 1.1 today. She has had improvement in motor planning with no overt left neglect  or bias per PT notes from yesterday. Speech deficits felt to be due to sleep disruption? Therapy has been working with patient who continues to be limited by lethargy, motor planning deficits, continues to have minimal verbalization and decrease in carryover. CIR recommended due to functional decline.      Review of Systems  Unable to perform ROS: Mental acuity            Past Medical History:  Diagnosis Date   Abnormal chest CT     Anemia     Aortic atherosclerosis (HCC)     Asthma     Atrial enlargement, left      severe   Atrial fibrillation (HCC) 01/05/2011    Chronic persistent, failed DCCV    Atrial flutter (HCC)     Bronchospasm 1998   Cardiomegaly     Carotid bruit     Chronic diastolic congestive heart failure (HCC)     Diabetes mellitus      Type II   Ejection fraction < 50%      35-40%,    Heart murmur     History of blood transfusion     History of radiation therapy 03/18/19-04/15/19    endometrial - vaginal brachytherapy -  Dr. Sondra Come    Hypercholesterolemia     Hypertension     Junctional rhythm 09/14/2018    Noted on EKG   Left bundle branch block (LBBB) 09/14/2018    Noted on EKG   LVH (left ventricular hypertrophy) 10/10/2016    Moderate, Noted on  ECHO   Mitral regurgitation     Obesity     Obesity (BMI 30-39.9) 01/16/2012   Persistent atrial fibrillation (HCC)     Polyp of rectum     Rheumatic fever 01/16/2012    Reported during childhood   S/P Maze operation for atrial fibrillation 02/15/2012    Complete biatrial lesion set using bipolar radiofrequency and cryothermy ablation with clipping of LA appendage   S/P mitral valve replacement with metallic valve 22/29/7989    36m Sorin [employer] Optiform mechanical prosthesis   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    260mEdwards mc3 ring annuloplasty   Shortness of breath      with exertion   Sleep apnea      DOES NOT HAVE CPAP   [id number]    Post op , left arm weakness   Tricuspid  regurgitation 01/16/2012   [facility]            Past Surgical History:  Procedure Laterality Date   CARDIAC CATHETERIZATION   >5 years   CARDIOVASCULAR STRESS TEST   09/2010   CARDIOVERSION   03/25/2011    Procedure: CARDIOVERSION;  Surgeon: JaJettie BoozeMD;  [place] [facility]: Cardiovascular;  Laterality: N/A;   CARDIOVERSION N/A 07/27/2012    Procedure: CARDIOVERSION;  Surgeon: JaJettie BoozeMD;  [place] [facility] Service: Cardiovascular;  Laterality: N/A;   CESAREAN SECTION       COLONOSCOPY WITH PROPOFOL N/A 04/27/2016    Procedure: COLONOSCOPY WITH PROPOFOL;  Surgeon: ViWilford CornerMD;  [facility];  Service: Endoscopy;  Laterality: N/A;   CRANIOTOMY Left 12/03/2021    Procedure: FRONTOTEMPORAL CRANIOTOMY FOR EVACUATION SUBDURAL HEMATOMA;  Surgeon: DaKarsten RoDO;  [place] [facility] Service: Neurosurgery;  Laterality: Left;   INTRAOPERATIVE TRANSESOPHAGEAL ECHOCARDIOGRAM   02/15/2012    Procedure: INTRAOPERATIVE TRANSESOPHAGEAL ECHOCARDIOGRAM;  Surgeon: ClRexene AlbertsMD;  [facility] Service: Open Heart Surgery;  Laterality: N/A;   MAZE   02/15/2012    Procedure: MAZE;  Surgeon: ClRexene AlbertsMD;  [place]: [facility] Service: Open Heart Surgery;  Laterality: N/A;   MITRAL VALVE REPLACEMENT   02/15/2012    Procedure: MITRAL VALVE (MV) REPLACEMENT;  Surgeon: ClRexene AlbertsMD;  [place] [facility] Service: Open Heart Surgery;  Laterality: N/A;   ROBOTIC ASSISTED LAPAROSCOPIC HYSTERECTOMY AND SALPINGECTOMY Bilateral 01/02/2019    Procedure: XI ROBOTIC ASSISTED LAPAROSCOPIC TOTAL HYSTERECTOMY WITH BILATERAL SALPINGOOPHORECTOMY, SENTINEL LYMPH NODE BIOPSY;  Surgeon: TuLafonda MossesMD;  Location: [facility];  Service: Gynecology;  Laterality: Bilateral;   TEE WITHOUT CARDIOVERSION   12/21/2011    Procedure: TRANSESOPHAGEAL ECHOCARDIOGRAM (TEE);  Surgeon: JaJettie BoozeMD;  [place] [facility] Service:  Cardiovascular;  Laterality: N/A;   TOOTH EXTRACTION N/A 11/19/2021    Procedure: DENTAL EXTRACTION TEETH NUMBER 14, 19, 30, 31;  Surgeon: JeDiona BrownerDMD;  Location: [facility] Service: Oral Surgery;  Laterality: N/A;   TRICUSPID VALVE REPLACEMENT   02/15/2012    Procedure: TRICUSPID VALVE REPAIR;  Surgeon: ClRexene AlbertsMD;  [place] Service: Open Heart Surgery;  Laterality: N/A;   TUBAL LIGATION        at time of her c-section           Family History  Problem Relation Age of Onset   Asthma Mother     Diabetes Mother     Hypertension Mother  Hypertension Brother     Heart attack Neg Hx     Stroke Neg Hx     Colon cancer Neg Hx     Colon polyps Neg Hx     Liver disease Neg Hx     Uterine cancer Neg Hx     Ovarian cancer Neg Hx     Breast cancer Neg Hx        Social History:  reports that she has never smoked. She has never used smokeless tobacco. She reports that she does not drink alcohol and does not use drugs.          Allergies  Allergen Reactions   Chlorhexidine Other (See Comments)      Unknown            Medications Prior to Admission  Medication Sig Dispense Refill   [START ON 12/13/2021] alendronate (FOSAMAX) 70 MG tablet Take 1 tablet (70 mg total) by mouth once a week. Take with a full glass of water on an empty stomach. 4 tablet 1   amiodarone (PACERONE) 200 MG tablet TAKE 1 TABLET(200 MG) BY MOUTH DAILY (Patient not taking: Reported on 12/02/2021) 90 tablet 2   amLODipine (NORVASC) 10 MG tablet TAKE 1 TABLET EVERY DAY 90 tablet 3   atorvastatin (LIPITOR) 40 MG tablet TAKE 1 TABLET(40 MG) BY MOUTH DAILY (Patient taking differently: Take 40 mg by mouth daily.) 90 tablet 2   carvedilol (COREG) 12.5 MG tablet TAKE 1 TABLET TWICE DAILY WITH MEALS 180 tablet 3   enoxaparin (LOVENOX) 100 MG/ML injection Inject 1 mL (100 mg total) into the skin every 12 (twelve) hours. 10 mL 0   HYDROcodone-acetaminophen (NORCO) 5-325 MG tablet Take 1 tablet by mouth  every 4 (four) hours as needed for moderate pain. 18 tablet 0   irbesartan (AVAPRO) 300 MG tablet TAKE 1 TABLET EVERY DAY 90 tablet 3   levETIRAcetam (KEPPRA) 500 MG tablet Take 1 tablet (500 mg total) by mouth 2 (two) times daily. 60 tablet 1   metFORMIN (GLUCOPHAGE) 500 MG tablet Take 500 mg by mouth 2 (two) times daily with a meal.        [START ON 12/11/2021] pantoprazole (PROTONIX) 40 MG tablet Take 1 tablet (40 mg total) by mouth daily. 30 tablet 1   warfarin (COUMADIN) 4 MG tablet TAKE 1 AND 1/2 TABLETS TO 2 TABLETS BY MOUTH DAILY AS DIRECTED BY COUMADIN CLINIC (Patient taking differently: Take 6 mg by mouth at bedtime.) 180 tablet 1          Home: Home Living Family/patient expects to be discharged to:: Inpatient rehab Living Arrangements: Spouse/significant other, Children (and adult son) Available Help at Discharge: Family, Available 24 hours/day (daughter, Elwin Sleight can take FMLA) Type of Home: House Home Access: Stairs to enter [employer] of Steps: 2 and 1 (no rails) Home Layout: One level Bathroom Shower/Tub: Tub/shower unit, Architectural technologist: Standard Bathroom Accessibility: Yes Home Equipment: Shower seat Additional Comments: spouse currently leaves the house to take care of his mother  Lives With: Son   Functional History: Prior Function Prior Level of Function : Independent/Modified Independent, Driving Mobility Comments: Independent ADLs Comments: Independent   Functional Status:  Mobility: Bed Mobility Overal bed mobility: Needs Assistance Bed Mobility: Supine to Sit, Sit to Supine Supine to sit: Min assist Sit to supine: Min assist General bed mobility comments: min guard for safety light assist to manage LEs, pt with improved sequencing Transfers Overall transfer level: Needs assistance Equipment used: 1  person hand held assist Transfers: Sit to/from Stand Sit to Stand: Min assist General transfer comment: multimodal cues to initiate  with anterior weight shift Ambulation/Gait Ambulation/Gait assistance: Min assist, Min guard Gait Distance (Feet): 150 Feet Assistive device: IV Pole Gait Pattern/deviations: Decreased step length - right, Decreased step length - left, Wide base of support, Drifts right/left General Gait Details: multimodal directional cues to navigate hallway.pt able to push IV pole with both hands and maintain close to center of hall without bumping into obstacels Gait velocity: slowed Gait velocity interpretation: <1.31 ft/sec, indicative of household ambulator   ADL: ADL Overall ADL's : Needs assistance/impaired Eating/Feeding Details (indicate cue type and reason): Dtr reports yesterday she feed herself, today she had to feed her with the only thing the patient did was initiate holding cup to drink from and shook head yes/no to if she wanted what dtr was trying to feed her. Grooming: Oral care, Standing, Maximal assistance Grooming Details (indicate cue type and reason): max assist including setup of supplies, applying toothpaste, and completing steps for task Upper Body Bathing: Maximal assistance, Sitting Upper Body Bathing Details (indicate cue type and reason): EOB Lower Body Bathing: Maximal assistance Lower Body Bathing Details (indicate cue type and reason): Mod A sit<>stand Upper Body Dressing : Total assistance, Sitting Upper Body Dressing Details (indicate cue type and reason): EOB Lower Body Dressing: Moderate assistance Lower Body Dressing Details (indicate cue type and reason): Mod A sit<>stand Toilet Transfer: Minimal assistance, Ambulation, BSC/3in1 Toilet Transfer Details (indicate cue type and reason): hand held assist to the 3:1 over the toilet Toileting- Clothing Manipulation and Hygiene: Moderate assistance, Sit to/from stand Toileting - Clothing Manipulation Details (indicate cue type and reason): clothing management Functional mobility during ADLs: Minimal assistance (no  assistive device) General ADL Comments: Pt demonstrates significant motor planning deficits when attempting to sequence through oral hygiene.  She needed hand over hand assist to apply toothpaste on the toothbrush secondary to not comprehending and instead continued to try and put the toothbrush in her mouth without it.  When toothbrush was removed from her right hand in order to have her work on opening toothpaste, she instead tried to then put her finger in her mouth.   Cognition: Cognition Overall Cognitive Status: Impaired/Different from baseline Orientation Level: Other (comment) (UTA expressive aphasia) Cognition Arousal/Alertness: Awake/alert Behavior During Therapy: Flat affect Overall Cognitive Status: Impaired/Different from baseline Area of Impairment: Following commands, Safety/judgement, Awareness, Problem solving, Attention, Memory, Orientation Orientation Level: Disoriented to (slow to respond, states only first name when asked name, verbal cues for last name, unable to state hospital but when asked if she was in a restaurant she responded no, then responded yes to hospital) Current Attention Level: Focused Memory: Decreased short-term memory Following Commands: Follows one step commands with increased time, Follows one step commands inconsistently Safety/Judgement: Decreased awareness of safety, Decreased awareness of deficits Awareness: Intellectual Problem Solving: Slow processing, Decreased initiation, Difficulty sequencing, Requires verbal cues, Requires tactile cues General Comments: pt appears to have worsening expressive aphasia and word finding difficulty today in addition to receptive aphasia.pt able to follow all commands, only verbalizing yea and no throughout session Difficult to assess due to: Impaired communication     Blood pressure (!) 105/54, pulse 69, temperature 99.2 F (37.3 C), temperature source Oral, resp. rate 18, height '5\' 6"'$  (1.676 m), weight 95 kg,  SpO2 94 %. Physical Exam Vitals and nursing note reviewed.  Constitutional:      Comments: Left crani incision C/D/I  with staples in place. Tender to touch.   HENT:     Nose: Nose normal.     Mouth/Throat:     Mouth: Mucous membranes are moist.  Eyes:     General: No scleral icterus.    Extraocular Movements: Extraocular movements intact.     Pupils: Pupils are equal, round, and reactive to light.  Cardiovascular:     Rate and Rhythm: Normal rate and regular rhythm.     Pulses: Normal pulses.     Heart sounds: Murmur heard.  Pulmonary:     Effort: Pulmonary effort is normal. No respiratory distress.     Breath sounds: Normal breath sounds. No wheezing.  Abdominal:     General: Bowel sounds are normal. There is no distension.     Palpations: Abdomen is soft.     Tenderness: There is no abdominal tenderness.  Musculoskeletal:     Cervical back: Normal range of motion.  Skin:    General: Skin is warm and dry.  Neurological:     Mental Status: She is alert.     Comments: Pt alert, makes good eye contact. Right central 7. Able to verbalize on limited basis. Told me she lived in [place] when given choices.Able to tell me daughter's name when I asked.  Exp>rec language deficits with verbal and motor apraxia noted. Could touch back of head with left hand but could not coordinate right hand to do so. Moves all 4's, left preferentially to right. Senses light touch and pain in all 4's. No resting tone.       Psychiatric:     Comments: Very flat, but engages when stimulated.         Lab Results Last 48 Hours        Results for orders placed or performed during the hospital encounter of 12/01/21 (from the past 48 hour(s))  Glucose, capillary     Status: Abnormal    Collection Time: 12/08/21  5:16 PM  Result Value Ref Range    Glucose-Capillary 118 (H) 70 - 99 mg/dL      Comment: Glucose reference range applies only to samples taken after fasting for at least 8 hours.  Heparin level  (unfractionated)     Status: None    Collection Time: 12/08/21  8:54 PM  Result Value Ref Range    Heparin Unfractionated 0.40 0.30 - 0.70 IU/mL      Comment: (NOTE) The clinical reportable range upper limit is being lowered to >1.10 to align with the FDA approved guidance for the current laboratory assay.   If heparin results are below expected values, and patient dosage has  been confirmed, suggest follow up testing of antithrombin III levels. Performed at [facility], [place] [address], [place], [place] [address]    Glucose, capillary     Status: Abnormal    Collection Time: 12/08/21  9:33 PM  Result Value Ref Range    Glucose-Capillary 158 (H) 70 - 99 mg/dL      Comment: Glucose reference range applies only to samples taken after fasting for at least 8 hours.  Glucose, capillary     Status: Abnormal    Collection Time: 12/09/21  3:13 AM  Result Value Ref Range    Glucose-Capillary 148 (H) 70 - 99 mg/dL      Comment: Glucose reference range applies only to samples taken after fasting for at least 8 hours.  CBC     Status: Abnormal    Collection Time: 12/09/21  7:43 AM  Result Value Ref Range    WBC 6.3 4.0 - 10.5 K/uL    RBC 2.74 (L) 3.87 - 5.11 MIL/uL    Hemoglobin 8.7 (L) 12.0 - 15.0 g/dL    HCT 26.9 (L) 36.0 - 46.0 %    MCV 98.2 80.0 - 100.0 fL    MCH 31.8 26.0 - 34.0 pg    MCHC 32.3 30.0 - 36.0 g/dL    RDW 12.8 11.5 - 15.5 %    Platelets 168 150 - 400 K/uL    nRBC 0.0 0.0 - 0.2 %      Comment: Performed at [facility], [place] [address], [place], [place] [address]  Heparin level (unfractionated)     Status: None    Collection Time: 12/09/21  7:43 AM  Result Value Ref Range    Heparin Unfractionated 0.39 0.30 - 0.70 IU/mL      Comment: (NOTE) The clinical reportable range upper limit is being lowered to >1.10 to align with the FDA approved guidance for the current laboratory assay.   If heparin results are below expected values, and patient dosage has   been confirmed, suggest follow up testing of antithrombin III levels. Performed at [facility], [place] [address], [place], [place] [address]    Glucose, capillary     Status: Abnormal    Collection Time: 12/09/21  8:02 AM  Result Value Ref Range    Glucose-Capillary 153 (H) 70 - 99 mg/dL      Comment: Glucose reference range applies only to samples taken after fasting for at least 8 hours.    Comment 1 Notify RN      Comment 2 Document in Chart    Glucose, capillary     Status: Abnormal    Collection Time: 12/09/21 11:06 AM  Result Value Ref Range    Glucose-Capillary 124 (H) 70 - 99 mg/dL      Comment: Glucose reference range applies only to samples taken after fasting for at least 8 hours.    Comment 1 Notify RN      Comment 2 Document in Chart    Protime-INR     Status: None    Collection Time: 12/09/21  2:00 PM  Result Value Ref Range    Prothrombin Time 14.2 11.4 - 15.2 seconds    INR 1.1 0.8 - 1.2      Comment: (NOTE) INR goal varies based on device and disease states. Performed at [facility], [place] [address], [place], [place] [address]    Glucose, capillary     Status: Abnormal    Collection Time: 12/09/21  3:50 PM  Result Value Ref Range    Glucose-Capillary 178 (H) 70 - 99 mg/dL      Comment: Glucose reference range applies only to samples taken after fasting for at least 8 hours.    Comment 1 Notify RN      Comment 2 Document in Chart    Glucose, capillary     Status: Abnormal    Collection Time: 12/09/21  9:04 PM  Result Value Ref Range    Glucose-Capillary 145 (H) 70 - 99 mg/dL      Comment: Glucose reference range applies only to samples taken after fasting for at least 8 hours.    Comment 1 Notify RN      Comment 2 Document in Chart    CBC     Status: Abnormal    Collection Time: 12/10/21  3:18 AM  Result Value Ref Range    WBC 8.4 4.0 - 10.5 K/uL    RBC 2.97 (L) 3.87 - 5.11 MIL/uL    Hemoglobin 9.4 (L) 12.0 - 15.0 g/dL    HCT 28.9 (L) 36.0  - 46.0 %    MCV 97.3 80.0 - 100.0 fL    MCH 31.6 26.0 - 34.0 pg    MCHC 32.5 30.0 - 36.0 g/dL    RDW 13.1 11.5 - 15.5 %    Platelets 179 150 - 400 K/uL    nRBC 0.0 0.0 - 0.2 %      Comment: Performed at [facility], [place] [address], [place], [place] [address]  Heparin level (unfractionated)     Status: None    Collection Time: 12/10/21  3:18 AM  Result Value Ref Range    Heparin Unfractionated 0.33 0.30 - 0.70 IU/mL      Comment: (NOTE) The clinical reportable range upper limit is being lowered to >1.10 to align with the FDA approved guidance for the current laboratory assay.   If heparin results are below expected values, and patient dosage has  been confirmed, suggest follow up testing of antithrombin III levels. Performed at [facility], [place] [address], [place], [place] [address]    Protime-INR     Status: None    Collection Time: 12/10/21  3:18 AM  Result Value Ref Range    Prothrombin Time 14.3 11.4 - 15.2 seconds    INR 1.1 0.8 - 1.2      Comment: (NOTE) INR goal varies based on device and disease states. Performed at [facility], [place] [address] [place], [place] [address]    Glucose, capillary     Status: Abnormal    Collection Time: 12/10/21  6:35 AM  Result Value Ref Range    Glucose-Capillary 138 (H) 70 - 99 mg/dL      Comment: Glucose reference range applies only to samples taken after fasting for at least 8 hours.    Comment 1 Notify RN      Comment 2 Document in Chart    Glucose, capillary     Status: Abnormal    Collection Time: 12/10/21 11:17 AM  Result Value Ref Range    Glucose-Capillary 177 (H) 70 - 99 mg/dL      Comment: Glucose reference range applies only to samples taken after fasting for at least 8 hours.       Imaging Results (Last 48 hours)  EEG adult   Result Date: 12/08/2021 Lora Havens, MD     12/08/2021  5:54 PM Patient Name: JOVANKA WESTGATE MRN: [id number] Epilepsy Attending: Lora Havens Referring  Physician/Provider: Karsten Ro, DO Date: 12/08/2021 Duration: 28.38 mins Patient history: 67yo F with left SDH. EEG to evaluate for seizure. Level of alertness:  awake AEDs during EEG study: LEV Technical aspects: This EEG study was done with scalp electrodes positioned according to the 10-20 International system of electrode placement. Electrical activity was reviewed with band pass filter of 1-'70Hz'$ , sensitivity of 7 uV/mm, display speed of 64m/sec with a '60Hz'$  notched filter applied as appropriate. EEG data were recorded continuously and digitally stored.  Video monitoring was available and reviewed as appropriate. Description: No posterior dominant rhythm was seen. EEG showed continuous generalized and lateralized left hemisphere 2-'3hz'$  delta slowing admixed with 5-'6hz'$  theta slowing in right hemisphere. Sharp waves were noted in left frontal region.  Hyperventilation and photic stimulation were  not performed.   ABNORMALITY -Sharp wave, left frontal region - Continuous slow, generalized and lateralized left hemisphere IMPRESSION: This study showed evidence of epileptogenicity arising from left frontal region. Additionally there is cortical dysfunction arising from left hemisphere likely secondary to underlying structural abnormality. Lastly there is moderate diffuse encephalopathy, nonspecific etiology. No seizures were seen throughout the recording. Priyanka O Yadav           Blood pressure (!) 105/54, pulse 69, temperature 99.2 F (37.3 C), temperature source Oral, resp. rate 18, height '5\' 6"'$  (1.676 m), weight 95 kg, SpO2 94 %.   Medical Problem List and Plan: 1. Functional deficits secondary to spontaneous bilateral SDH d/t anticoagulation. Pt is s/p left fronto-temporal craniotomy on 11/24.              -patient may shower             -ELOS/Goals: 10-14 days, supervision to min assist goals with PT, OT, SLP 2.  Mechanical mitral valve/Antithrombotics: -DVT/anticoagulation:  Pharmaceutical:  Coumadin and Lovenox bridge till INR therapeutic.              -antiplatelet therapy: N/A 3. Pain Management:  Tylenol prn.  4. Mood/Behavior/Sleep: LCSW to follow for evaluation and support.              -antipsychotic agents: N/A 5. Neuropsych/cognition: This patient is not capable of making decisions on her own behalf. 6. Skin/Wound Care: Routine pressure relief measures.  7. Fluids/Electrolytes/Nutrition: Monitor I/O. Will need assistance with meals.              -pt eating with cues --Check CMET on 12/04 8. H/o A fib: Monitor HR TID. Continue amiodarone and coreg BID 9. T2DM: Hgb A1c- 5.8 and well controlled on SSI at this time             --was on metformin PTA.  10 ABLA: Monitor for signs of bleeding. Has had intermittent drop in platelets.  --Recheck CBC on 12/04  11. Seizure prophylaxis: Continues on Keppra 500  mg BID.              --monitor for seizures.  12. HTN: Monitor BP TID--on coreg, Norvasc and Avapro 13. Urinary retention:  Has been incontinent of urine. Toilet patient while awake.  --Per records cathed for 800 cc on 11/29. Monitor voiding with PVR checks.         Bary Leriche, PA-C 12/10/2021  I have personally performed a face to face diagnostic evaluation of this patient and formulated the key components of the plan.  Additionally, I have personally reviewed laboratory data, imaging studies, as well as relevant notes and concur with the physician assistant's documentation above.  The patient's status has not changed from the original H&P.  Any changes in documentation from the acute care chart have been noted above.  [name], MD, [facility]

## 2021-12-10 NOTE — Progress Notes (Signed)
ANTICOAGULATION CONSULT NOTE  Pharmacy Consult:  Heparin transitioned to coumadin / lovenox bridge Indication: Mechanical mitral valve  Allergies  Allergen Reactions   Chlorhexidine Other (See Comments)    Unknown    Patient Measurements: Height: '5\' 6"'$  (167.6 cm) Weight: 95 kg (209 lb 7 oz) IBW/kg (Calculated) : 59.3 Heparin Dosing Weight: 84 kg  Vital Signs: Temp: 98.3 F (36.8 C) (12/01 1644) Temp Source: Oral (12/01 1644) BP: 120/53 (12/01 1644) Pulse Rate: 68 (12/01 1644)  Labs: Recent Labs    12/08/21 0556 12/08/21 1210 12/08/21 2054 12/09/21 0743 12/09/21 1400 12/10/21 0318  HGB 8.5*  --   --  8.7*  --  9.4*  HCT 26.3*  --   --  26.9*  --  28.9*  PLT 159  --   --  168  --  179  LABPROT  --   --   --   --  14.2 14.3  INR  --   --   --   --  1.1 1.1  HEPARINUNFRC 0.41   < > 0.40 0.39  --  0.33   < > = values in this interval not displayed.     Estimated Creatinine Clearance: 72.1 mL/min (by C-G formula based on SCr of 0.88 mg/dL).  Assessment: 35 YOF with history of mechanical MVR in 2014 and Afib on Coumadin PTA admitted 11/23 with bleeding gums s/p teeth removal on 11/19/21.  CT on admit also showed acute subdural hematoma with midline shift.  She received KCentra, Vitamin K '10mg'$  and TXA on 11/23.  S/p crani for evacuation on 12/03/21.  Pharmacy consulted to dose IV heparin.   Heparin level remains therapeutic this morning at 0.33. Maintenance warfarin dosing regimen PTA '6mg'$  daily. INR today 1.1   Goal of Therapy:  Heparin level 0.3-0.4 units/ml per NSGY, no bolus INR 2.5-3.5 per clinic notes Monitor platelets by anticoagulation protocol: Yes   Plan:  Continue IV heparin at 1150 units/hr Warfarin 7.'5mg'$  PO x1 tonight to help augment INR Daily heparin level and CBC Continue parenteral anticoagulation until INR > 2.5  [name], PharmD, BCPS 12/10/2021 4:49 PM  PM UPDATE: - Patient transferring to CIR - will transition IV heparin to Lovenox  SQ while bridging to warfarin - D/c heparin drip - Begin Lovenox '1mg'$ /kg (95) SQ q12 hours  - Warfarin plan as above   [name] BS, PharmD, BCPS Clinical Pharmacist 12/10/2021 4:57 PM  Contact: [phone] after 3 PM  "Be curious, not judgmental..." -Jamal Maes

## 2021-12-10 NOTE — Discharge Summary (Signed)
Physician Discharge Summary  [name] ZOX:[id number] DOB: 1955/04/21 DOA: 12/01/2021  PCP: Lennie Odor, PA  Admit date: 12/01/2021 Discharge date: 12/10/2021  Admitted From: Home Disposition:  Inpatient rehab  Recommendations for Outpatient Follow-up:  Follow up with PCP in 1-2 weeks Follow-up with neurology and neurosurgery as scheduled Continue Lovenox and to titrate Coumadin as directed until INR is therapeutic  Discharge Condition: Stable CODE STATUS: Full Diet recommendation: Low-carb diabetic diet  Brief/Interim Summary: [name] is a 66 y.o. female who has a PMH of A.fib and mechanical MVR in 2014 on chronic Coumadin, CKD, DM. She presented to ED 11/23 with bleeding gums after dental extraction 11/10. As part of her workup, she had CT head which demonstrated acute on chronic bilateral DH with 52m L to R MLS. [facility] Kcentra [facility]. Patient taken to OR 11/24 for left frontotemporal craniotomy for evacuation of SDH, lysis, of membranes. Transitioned to the ICU postoperatively under neurosurgical care.   11/23 admitted to hospitalist service 11/24 to OR for evacuation of SDH - transitioned to ICU/NeuroSx services 11/25 Repeat CT is stable. EVD taken out. Transfer to progressive 11/27 NSG wanting patient transferred to ICU for closer monitoring for 48 hours to resume heparin 11/29 more aphasic/somnolent. EEG ordered 11/30 epileptogenicity noted without overt seizure  12/1 medically stable for discharge, continue to bridge from heparin/Lovenox to Coumadin as below, would reconsult pharmacy once patient is admitted to the inpatient rehab floor for further assistance with daily monitoring and adjustments.  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 - NeuroSx following post operatively - EEG with epileptogenicity but no over seizures - appreciate neuroSx recs - Continue keppra -Bridging back to warfarin, INR 1.1, will discharge on Lovenox, discussed with inpatient rehab need to reconsult pharmacy in the interim to assist with titration of Coumadin   Acute metabolic encephalopathy, improving Rule out concurrent seizure as above -Post operative imaging stable -Continue to follow w/ supportive care -No signs/symptoms history for alcohol/substane use/abuse. -EEG as above -continue keppra   A.fib, rate controlled Mitral stenosis s/p MVR 2014 on chronic coumadin Diastolic HF (Echo from EF 60-65%) HTN HLD -Cardiology signing off, appreciate assistance, no further indication for intervention -continue amio as well as home Norvasc, Coreg, Avapro and Lipitor   DM type 2 not on insulin -Continue sliding scale post operatively -Transition back to diabetic diet/metformin at DC given well controlled status   Anemia -Normocytic - likely chronic anemia of chronic disease with acute component given recent bleeds.  Discharge Instructions   Allergies as of 12/10/2021       Reactions   Chlorhexidine Other (See Comments)   Unknown        Medication List     STOP taking these medications    amoxicillin 500 MG capsule Commonly known as: AMOXIL   benzonatate 200 MG capsule Commonly known as: TESSALON  TAKE these medications    alendronate 70 MG tablet Commonly known as: FOSAMAX Take 1 tablet (70 mg total) by mouth once a week. Take with a full  glass of water on an empty stomach. Start taking on: December 13, 2021   amiodarone 200 MG tablet Commonly known as: PACERONE TAKE 1 TABLET(200 MG) BY MOUTH DAILY   amLODipine 10 MG tablet Commonly known as: NORVASC TAKE 1 TABLET EVERY DAY   atorvastatin 40 MG tablet Commonly known as: LIPITOR TAKE 1 TABLET(40 MG) BY MOUTH DAILY What changed: See the new instructions.   carvedilol 12.5 MG tablet Commonly known as: COREG TAKE 1 TABLET TWICE DAILY WITH MEALS   enoxaparin 100 MG/ML injection Commonly known as: LOVENOX Inject 1 mL (100 mg total) into the skin every 12 (twelve) hours.   HYDROcodone-acetaminophen 5-325 MG tablet Commonly known as: Norco Take 1 tablet by mouth every 4 (four) hours as needed for moderate pain.   irbesartan 300 MG tablet Commonly known as: AVAPRO TAKE 1 TABLET EVERY DAY What changed:  how much to take how to take this when to take this additional instructions   levETIRAcetam 500 MG tablet Commonly known as: KEPPRA Take 1 tablet (500 mg total) by mouth 2 (two) times daily.   metFORMIN 500 MG tablet Commonly known as: GLUCOPHAGE Take 500 mg by mouth 2 (two) times daily with a meal.   pantoprazole 40 MG tablet Commonly known as: PROTONIX Take 1 tablet (40 mg total) by mouth daily. Start taking on: December 11, 2021   warfarin 4 MG tablet Commonly known as: COUMADIN Take as directed. If you are unsure how to take this medication, talk to your nurse or doctor. Original instructions: TAKE 1 AND 1/2 TABLETS TO 2 TABLETS BY MOUTH DAILY AS DIRECTED BY COUMADIN CLINIC What changed:  how much to take how to take this when to take this additional instructions        Allergies  Allergen Reactions   Chlorhexidine Other (See Comments)    Unknown    Consultations: Neurosurgery, cardiology, neurology, ICU   Procedures/Studies: EEG adult  Result Date: Dec 31, 2021 Lora Havens, MD     Dec 31, 2021  5:54 PM Patient Name: [name]  MRN: [id number] Epilepsy Attending: Lora Havens Referring Physician/Provider: Karsten Ro, DO Date: 12-31-21 Duration: 28.38 mins Patient history: 66yo F with left SDH. EEG to evaluate for seizure. Level of alertness:  awake AEDs during EEG study: LEV Technical aspects: This EEG study was done with scalp electrodes positioned according to the 10-20 International system of electrode placement. Electrical activity was reviewed with band pass filter of 1-'70Hz'$ , sensitivity of 7 uV/mm, display speed of 72m/sec with a '60Hz'$  notched filter applied as appropriate. EEG data were recorded continuously and digitally stored.  Video monitoring was available and reviewed as appropriate. Description: No posterior dominant rhythm was seen. EEG showed continuous generalized and lateralized left hemisphere 2-'3hz'$  delta slowing admixed with 5-'6hz'$  theta slowing in right hemisphere. Sharp waves were noted in left frontal region.  Hyperventilation and photic stimulation were not performed.   ABNORMALITY -Sharp wave, left frontal region - Continuous slow, generalized and lateralized left hemisphere IMPRESSION: This study showed evidence of epileptogenicity arising from left frontal region. Additionally there is cortical dysfunction arising from left hemisphere likely secondary to underlying structural abnormality. Lastly there is moderate diffuse encephalopathy, nonspecific etiology. No seizures were seen throughout the recording. Priyanka OBarbra Sarks  CT HEAD WO CONTRAST (5MM)  Result Date: 112/22/23CLINICAL  DATA:  Follow-up subdural hematoma.  Headache. EXAM: CT HEAD WITHOUT CONTRAST TECHNIQUE: Contiguous axial images were obtained from the base of the skull through the vertex without intravenous contrast. RADIATION DOSE REDUCTION: This exam was performed according to the departmental dose-optimization program which includes automated exposure control, adjustment of the mA and/or kV according to patient size and/or use of  iterative reconstruction technique. COMPARISON:  CT head 12/07/2021 FINDINGS: Brain: Mixed density subdural hematoma in the left frontal parietal and temporal lobe is unchanged. This measures up to 10 mm in thickness. Majority of the fluid is high density but there is also low-density fluid. Subdural gas bubbles unchanged from recent craniotomy. Mass-effect with approximate 4 mm midline shift to the right appears unchanged. Small right temporal subdural hematoma unchanged. No new area of hemorrhage.  No hydrocephalus.  No acute infarct. Vascular: Negative for hyperdense vessel Skull: Left frontal craniotomy unchanged. No acute skeletal abnormality. Sinuses/Orbits: Mild mucosal edema sphenoid sinus.  Negative orbit Other: None IMPRESSION: 1. Mixed density subdural hematoma on the left is unchanged. Mass-effect with 4 mm midline shift to the right unchanged. 2. Small right temporal subdural hematoma unchanged. 3. No new area of hemorrhage. Electronically Signed   By: Franchot Gallo M.D.   On: 12/08/2021 13:32   CT HEAD WO CONTRAST (5MM)  Result Date: 12/07/2021 CLINICAL DATA:  Intracranial hemorrhage.  Postop craniotomy. EXAM: CT HEAD WITHOUT CONTRAST TECHNIQUE: Contiguous axial images were obtained from the base of the skull through the vertex without intravenous contrast. RADIATION DOSE REDUCTION: This exam was performed according to the departmental dose-optimization program which includes automated exposure control, adjustment of the mA and/or kV according to patient size and/or use of iterative reconstruction technique. COMPARISON:  12/06/2021 at 5:11 a.m. FINDINGS: Brain: Unchanged appearance of left convexity subdural hematoma with small volume pneumocephalus. 2-3 mm of rightward midline shift is unchanged when measured at the level of the foramina Monro. No new site of hemorrhage. Vascular: No hyperdense vessel or unexpected calcification. Skull: Recent left-sided craniotomy Sinuses/Orbits: No acute  finding. Other: None. IMPRESSION: Unchanged appearance of left convexity subdural hematoma with small volume pneumocephalus. Electronically Signed   By: Ulyses Jarred M.D.   On: 12/07/2021 18:48   CT HEAD WO CONTRAST (5MM)  Result Date: 12/06/2021 CLINICAL DATA:  65 year old female with bilateral mixed density subdural hematomas postoperative day 3 left frontotemporal craniotomy, lysis of membranes and evacuation of hematoma. EXAM: CT HEAD WITHOUT CONTRAST TECHNIQUE: Contiguous axial images were obtained from the base of the skull through the vertex without intravenous contrast. RADIATION DOSE REDUCTION: This exam was performed according to the departmental dose-optimization program which includes automated exposure control, adjustment of the mA and/or kV according to patient size and/or use of iterative reconstruction technique. COMPARISON:  12/04/2021 and earlier. FINDINGS: Brain: Percutaneous left subdural drain has been removed since 12/04/2021. Mixed density left side subdural hematoma is stable since 12/04/2021, with a focal lobulated component up to 9 mm in thickness, but is mostly 7-8 mm thickness elsewhere. Small volume residual pneumocephalus along the anterior left frontal convexity. Smaller mixed density right side subdural measuring up to 4-5 mm in thickness is stable. No new areas of intracranial hemorrhage identified. Stable trace rightward midline shift. Stable mild mass effect on the lateral ventricles. No ventriculomegaly. Stable gray-white matter differentiation throughout the brain. Basilar cisterns remain normal. No cortically based acute infarct identified. Vascular: Calcified atherosclerosis at the skull base. Skull: Stable recent left frontotemporal craniotomy. Sinuses/Orbits: Visualized paranasal sinuses and mastoids are stable and  well aerated. Other: Percutaneous left subdural drain has been removed. Postoperative changes to the scalp, skin staples remain in place. Orbits appear  negative. IMPRESSION: 1. Left subdural drain removed. Unchanged mixed density Left > Right SDH since 12/04/2021. Residual hematoma up to 9 mm on the left and 5 mm on the right. 2. Stable mild intracranial mass effect with trace rightward midline shift. 3. No new intracranial abnormality. Electronically Signed   By: Genevie Ann M.D.   On: 12/06/2021 05:24   CT HEAD WO CONTRAST  Result Date: 12/04/2021 CLINICAL DATA:  66 year old female with altered mental status and bilateral subdural hematoma postoperative day 1 status post left frontotemporal craniotomy and surgical evacuation. EXAM: CT HEAD WITHOUT CONTRAST TECHNIQUE: Contiguous axial images were obtained from the base of the skull through the vertex without intravenous contrast. RADIATION DOSE REDUCTION: This exam was performed according to the departmental dose-optimization program which includes automated exposure control, adjustment of the mA and/or kV according to patient size and/or use of iterative reconstruction technique. COMPARISON:  Head CT 12/02/2021 and earlier. FINDINGS: Brain: Percutaneous left subdural drain in place via new left frontotemporal craniotomy. Regressed mixed density left side subdural hematoma, which remains mildly lobulated. Maximal thickness now 9 mm, previously up to 16 mm). And substantially decreased mass effect on the superior left hemisphere. Small volume postoperative pneumocephalus. Decreased midline shift, now trace. Improved left lateral ventricle. Smaller mixed density right side subdural hematoma is stable, generally 4 mm thickness or less. No new areas of intracranial hemorrhage. Basilar cisterns now appear normal. No ventriculomegaly. Stable basal ganglia vascular calcifications. Stable gray-white matter differentiation throughout the brain. No cortically based acute infarct identified. Vascular: Calcified atherosclerosis at the skull base. Skull: New left frontotemporal craniotomy. Sinuses/Orbits: Visualized paranasal  sinuses and mastoids are stable and well aerated. Other: Postoperative changes to the left scalp now. Skin staples in place. Percutaneous drain in place. Orbits appear stable, negative. IMPRESSION: 1. Regressed left side subdural status post surgical evacuation, subdural drain placement. Mixed density Left SDH now up to 9 mm. Stable smaller ~4 mm Right SDH. Decreased intracranial mass effect with now trace rightward midline shift. 2. No new intracranial abnormality. Electronically Signed   By: Genevie Ann M.D.   On: 12/04/2021 07:13   CT HEAD WO CONTRAST (5MM)  Result Date: 12/02/2021 CLINICAL DATA:  Subdural hematoma. EXAM: CT HEAD WITHOUT CONTRAST TECHNIQUE: Contiguous axial images were obtained from the base of the skull through the vertex without intravenous contrast. RADIATION DOSE REDUCTION: This exam was performed according to the departmental dose-optimization program which includes automated exposure control, adjustment of the mA and/or kV according to patient size and/or use of iterative reconstruction technique. COMPARISON:  Head CT 12/02/2021. FINDINGS: Brain: Mixed density subdural collections overlying the bilateral cerebral hemispheres, unchanged in size from the head CT performed earlier today. These likely reflect acute on chronic subdural hematomas or hematohygromas. As before, the collection on the left measures up to 16 mm in thickness and the collection on the right measures up to 6 mm in thickness. Unchanged mass effect upon the left cerebral hemispheres with partial effacement of left lateral ventricle an 8 mm right midline shift. No demarcated cortical infarct. No extra-axial fluid collection. No evidence of an intracranial mass. Vascular: No hyperdense vessel.  Atherosclerotic calcifications. Skull: No fracture or aggressive osseous lesion. Sinuses/Orbits: No mass or acute finding within the imaged orbits. No significant paranasal sinus disease at the imaged levels. IMPRESSION: Unchanged  mixed density subdural collections overlying the bilateral  cerebral hemispheres, likely reflecting acute on chronic subdural hematomas or hematohygromas. As before, these collections measure up to 16 mm in thickness on the left and 6 mm on the right. Unchanged mass effect upon the left cerebral hemisphere with partial effacement of the left lateral ventricle and 8 mm rightward midline shift. Electronically Signed   By: Kellie Simmering D.O.   On: 12/02/2021 13:36   CT Head Wo Contrast  Result Date: 12/02/2021 CLINICAL DATA:  Altered mental status EXAM: CT HEAD WITHOUT CONTRAST TECHNIQUE: Contiguous axial images were obtained from the base of the skull through the vertex without intravenous contrast. RADIATION DOSE REDUCTION: This exam was performed according to the departmental dose-optimization program which includes automated exposure control, adjustment of the mA and/or kV according to patient size and/or use of iterative reconstruction technique. COMPARISON:  04/14/2012 FINDINGS: Brain: Bilateral subdural collections, measuring up to 16 mm on the left (series 4, image 27) and up to 6 mm on the right (series 4, image 40). These collections are mixed density and possibly loculated. 8 mm left to right midline shift. Effacement of the left-greater-than-right sulci. Narrowing of the left-greater-than-right lateral ventricle and third ventricle. No definite acute infarct, parenchymal hemorrhage, or hydrocephalus. No enlargement of the temporal horns to suggest entrapment. Vascular: No hyperdense vessel. Skull: Normal. Negative for fracture or focal lesion. Sinuses/Orbits: No acute finding. Other: The mastoid air cells are well aerated. IMPRESSION: Bilateral mixed density subdural collections, most likely acute on chronic hemorrhage, measuring up to 16 mm on the left and 6 mm on the right, with 8 mm left to right midline shift and effacement of the left-greater-than-right sulci. These results were called by telephone  at the time of interpretation on 12/02/2021 at 3:09 am to provider DAVID [facility] , who verbally acknowledged these results. Electronically Signed   By: Merilyn Baba M.D.   On: 12/02/2021 03:09   MM 3D SCREEN BREAST BILATERAL  Result Date: 11/16/2021 CLINICAL DATA:  Screening. EXAM: DIGITAL SCREENING BILATERAL MAMMOGRAM WITH TOMOSYNTHESIS AND CAD TECHNIQUE: Bilateral screening digital craniocaudal and mediolateral oblique mammograms were obtained. Bilateral screening digital breast tomosynthesis was performed. The images were evaluated with computer-aided detection. COMPARISON:  Previous exam(s). ACR Breast Density Category b: There are scattered areas of fibroglandular density. FINDINGS: There are no findings suspicious for malignancy. IMPRESSION: No mammographic evidence of malignancy. A result letter of this screening mammogram will be mailed directly to the patient. RECOMMENDATION: Screening mammogram in one year. (Code:SM-B-01Y) BI-RADS CATEGORY  1: Negative. Electronically Signed   By: Everlean Alstrom M.D.   On: 11/16/2021 11:17     Subjective: No acute issues or events overnight denies nausea vomiting diarrhea constipation headache fevers chills or chest pain.  Much more awake alert and interactive today with husband at bedside.  Discharge Exam: Vitals:   12/10/21 0746 12/10/21 1124  BP: (!) 149/68 (!) 85/52  Pulse: 87 77  Resp: 20 18  Temp: 98 F (36.7 C) 99.2 F (37.3 C)  SpO2: 97% 94%   Vitals:   12/10/21 0519 12/10/21 0740 12/10/21 0746 12/10/21 1124  BP: (!) 158/72 129/73 (!) 149/68 (!) 85/52  Pulse: 88 87 87 77  Resp: '16 18 20 18  '$ Temp: 98.1 F (36.7 C) 98.3 F (36.8 C) 98 F (36.7 C) 99.2 F (37.3 C)  TempSrc: Oral Oral Oral Oral  SpO2: 96% 100% 97% 94%  Weight:      Height:        General: Pt is alert, awake, not in  acute distress HEENT: Postsurgical wound clean dry intact Cardiovascular: RRR, S1/S2 +, no rubs, no gallops Respiratory: CTA bilaterally, no wheezing,  no rhonchi Abdominal: Soft, NT, ND, bowel sounds + Extremities: no edema, no cyanosis    The results of significant diagnostics from this hospitalization (including imaging, microbiology, ancillary and laboratory) are listed below for reference.     Microbiology: Recent Results (from the past 240 hour(s))  Surgical PCR screen     Status: None   Collection Time: 12/02/21  7:38 PM   Specimen: Nasal Mucosa; Nasal Swab  Result Value Ref Range Status   MRSA, PCR NEGATIVE NEGATIVE Final   Staphylococcus aureus NEGATIVE NEGATIVE Final    Comment: (NOTE) The Xpert SA Assay (FDA approved for NASAL specimens in patients 61 years of age and older), is one component of a comprehensive surveillance program. It is not intended to diagnose infection nor to guide or monitor treatment. Performed at [facility], [place] [address] [place], [place] [address]      Labs: BNP (last 3 results) No results for input(s): "BNP" in the last 8760 hours. Basic Metabolic Panel: Recent Labs  Lab 12/04/21 0424 12/05/21 0428 12/06/21 0423 12/07/21 0054  NA 142 141 140 140  K 4.3 3.9 3.8 4.1  CL 107 109 108 104  CO2 '25 27 27 26  '$ GLUCOSE 173* 119* 129* 156*  BUN '14 12 12 15  '$ CREATININE 0.85 0.79 0.75 0.88  CALCIUM 9.0 8.3* 8.3* 8.6*  MG 1.9 1.9 1.9 1.9   Liver Function Tests: No results for input(s): "AST", "ALT", "ALKPHOS", "BILITOT", "PROT", "ALBUMIN" in the last 168 hours. No results for input(s): "LIPASE", "AMYLASE" in the last 168 hours. No results for input(s): "AMMONIA" in the last 168 hours. CBC: Recent Labs  Lab 12/04/21 0424 12/05/21 0428 12/06/21 0423 12/07/21 0054 12/08/21 0556 12/09/21 0743 12/10/21 0318  WBC 11.2* 8.2 6.8 6.4 6.3 6.3 8.4  NEUTROABS 9.6* 5.9 4.9 4.4  --   --   --   HGB 8.9* 8.6* 7.9* 8.0* 8.5* 8.7* 9.4*  HCT 28.4* 28.0* 24.3* 24.9* 26.3* 26.9* 28.9*  MCV 99.6 102.6* 100.0 100.4* 100.0 98.2 97.3  PLT 178 174 140* 147* 159 168 179   Cardiac Enzymes: No  results for input(s): "CKTOTAL", "CKMB", "CKMBINDEX", "TROPONINI" in the last 168 hours. BNP: Invalid input(s): "POCBNP" CBG: Recent Labs  Lab 12/09/21 1106 12/09/21 1550 12/09/21 2104 12/10/21 0635 12/10/21 1117  GLUCAP 124* 178* 145* 138* 177*   D-Dimer No results for input(s): "DDIMER" in the last 72 hours. Hgb A1c No results for input(s): "HGBA1C" in the last 72 hours. Lipid Profile No results for input(s): "CHOL", "HDL", "LDLCALC", "TRIG", "CHOLHDL", "LDLDIRECT" in the last 72 hours. Thyroid function studies No results for input(s): "TSH", "T4TOTAL", "T3FREE", "THYROIDAB" in the last 72 hours.  Invalid input(s): "FREET3" Anemia work up No results for input(s): "VITAMINB12", "FOLATE", "FERRITIN", "TIBC", "IRON", "RETICCTPCT" in the last 72 hours. Urinalysis    Component Value Date/Time   COLORURINE YELLOW 12/02/2021 0156   APPEARANCEUR CLEAR 12/02/2021 0156   LABSPEC >1.030 (H) 12/02/2021 0156   PHURINE 5.5 12/02/2021 0156   GLUCOSEU NEGATIVE 12/02/2021 0156   HGBUR LARGE (A) 12/02/2021 0156   BILIRUBINUR SMALL (A) 12/02/2021 0156   KETONESUR 15 (A) 12/02/2021 0156   PROTEINUR 100 (A) 12/02/2021 0156   UROBILINOGEN 4.0 (H) 02/13/2012 1504   NITRITE NEGATIVE 12/02/2021 0156   LEUKOCYTESUR TRACE (A) 12/02/2021 0156   Sepsis Labs Recent Labs  Lab 12/07/21 0054  12/08/21 0556 12/09/21 0743 12/10/21 0318  WBC 6.4 6.3 6.3 8.4   Microbiology Recent Results (from the past 240 hour(s))  Surgical PCR screen     Status: None   Collection Time: 12/02/21  7:38 PM   Specimen: Nasal Mucosa; Nasal Swab  Result Value Ref Range Status   MRSA, PCR NEGATIVE NEGATIVE Final   Staphylococcus aureus NEGATIVE NEGATIVE Final    Comment: (NOTE) The Xpert SA Assay (FDA approved for NASAL specimens in patients 61 years of age and older), is one component of a comprehensive surveillance program. It is not intended to diagnose infection nor to guide or monitor treatment. Performed  at [facility], [place] [address] [place], [place] [address]      Time coordinating discharge: Over 30 minutes  SIGNED:   Little Ishikawa, DO [facility] 12/10/2021, 11:41 AM Pager   If 7PM-7AM, please contact night-coverage [email or web address]

## 2021-12-10 NOTE — Progress Notes (Addendum)
ANTICOAGULATION CONSULT NOTE  Pharmacy Consult:  Heparin Indication: Mechanical mitral valve  Allergies  Allergen Reactions   Chlorhexidine Other (See Comments)    Unknown    Patient Measurements: Height: '5\' 6"'$  (167.6 cm) Weight: 95 kg (209 lb 7 oz) IBW/kg (Calculated) : 59.3 Heparin Dosing Weight: 84 kg  Vital Signs: Temp: 98.1 F (36.7 C) (12/01 0519) Temp Source: Oral (12/01 0519) BP: 158/72 (12/01 0519) Pulse Rate: 88 (12/01 0519)  Labs: Recent Labs    12/08/21 0556 12/08/21 1210 12/08/21 2054 12/09/21 0743 12/09/21 1400 12/10/21 0318  HGB 8.5*  --   --  8.7*  --  9.4*  HCT 26.3*  --   --  26.9*  --  28.9*  PLT 159  --   --  168  --  179  LABPROT  --   --   --   --  14.2 14.3  INR  --   --   --   --  1.1 1.1  HEPARINUNFRC 0.41   < > 0.40 0.39  --  0.33   < > = values in this interval not displayed.     Estimated Creatinine Clearance: 72.1 mL/min (by C-G formula based on SCr of 0.88 mg/dL).  Assessment: 61 YOF with history of mechanical MVR in 2014 and Afib on Coumadin PTA admitted 11/23 with bleeding gums s/p teeth removal on 11/19/21.  CT on admit also showed acute subdural hematoma with midline shift.  She received KCentra, Vitamin K '10mg'$  and TXA on 11/23.  S/p crani for evacuation on 12/03/21.  Pharmacy consulted to dose IV heparin.   Heparin level remains therapeutic this morning at 0.33. Maintenance warfarin dosing regimen PTA '6mg'$  daily. INR today 1.1   Goal of Therapy:  Heparin level 0.3-0.4 units/ml per NSGY, no bolus INR 2.5-3.5 per clinic notes Monitor platelets by anticoagulation protocol: Yes   Plan:  Continue IV heparin at 1150 units/hr Warfarin 7.'5mg'$  PO x1 tonight to help augment INR Daily heparin level and CBC Continue parenteral anticoagulation until INR > 2.5  [name], PharmD, BCPS 12/10/2021 6:26 AM  PM UPDATE: - Patient transferring to CIR - will transition IV heparin to Lovenox SQ while bridging to warfarin - D/c  heparin drip - Begin Lovenox '1mg'$ /kg (95) SQ q12 hours  - Warfarin plan as above  [name], PharmD, BCPS 12/10/2021 11:58 AM

## 2021-12-10 NOTE — Progress Notes (Signed)
Patient arrieved from 2 W at 1520. Patient is non verbal. Opens her eyes when staff talks to her. Pupils reactive. No S/S of pain or distress. Skin CDI, positive pedal pulses, cap refill less than 3 seconds, and skin warm to the touch. Left daughter a message new room number. Natalie Chew NP notified patient opens her eyes however is non verbal. No New orders.

## 2021-12-10 NOTE — Progress Notes (Signed)
Inpatient Rehabilitation Admissions Coordinator   I have CIR bed today and approval from Dr Avon Gully to admit today. I met with patient and daughter at bedside and they are in agreement. I will alert acute team and TOC to make the arrangements to admit to CIR.  Danne Baxter, RN, MSN Rehab Admissions Coordinator [phone] 12/10/2021 10:45 AM

## 2021-12-10 NOTE — Progress Notes (Signed)
Inpatient Rehabilitation Admission Medication Review by a Pharmacist  A complete drug regimen review was completed for this patient to identify any potential clinically significant medication issues.  High Risk Drug Classes Is patient taking? Indication by Medication  Antipsychotic Yes Compazine- N/V  Anticoagulant Yes Warfarin / Lovenox- AF / MVR  Antibiotic No   Opioid Yes Norco- acute pain  Antiplatelet No   Hypoglycemics/insulin Yes Insulin- T2DM  Vasoactive Medication Yes Norvasc, Coreg, Avapro- HTN  Chemotherapy No   Other Yes Lipitor- HLD Keppra- seizure ppx Protonix- GERD Trazodone- sleep     Type of Medication Issue Identified Description of Issue Recommendation(s)  Drug Interaction(s) (clinically significant)     Duplicate Therapy     Allergy     No Medication Administration End Date     Incorrect Dose     Additional Drug Therapy Needed     Significant med changes from prior encounter (inform family/care partners about these prior to discharge).    Other       Clinically significant medication issues were identified that warrant physician communication and completion of prescribed/recommended actions by midnight of the next day:  No   Time spent performing this drug regimen review (minutes):  30   Shawnmichael Parenteau BS, PharmD, BCPS Clinical Pharmacist 12/10/2021 5:18 PM  Contact: [phone] after 3 PM  "Be curious, not judgmental..." -Jamal Maes

## 2021-12-11 DIAGNOSIS — D62 Acute posthemorrhagic anemia: Secondary | ICD-10-CM | POA: Diagnosis not present

## 2021-12-11 DIAGNOSIS — I6200 Nontraumatic subdural hemorrhage, unspecified: Secondary | ICD-10-CM | POA: Diagnosis not present

## 2021-12-11 DIAGNOSIS — E1169 Type 2 diabetes mellitus with other specified complication: Secondary | ICD-10-CM | POA: Diagnosis not present

## 2021-12-11 DIAGNOSIS — I10 Essential (primary) hypertension: Secondary | ICD-10-CM | POA: Diagnosis not present

## 2021-12-11 LAB — URINALYSIS, ROUTINE W REFLEX MICROSCOPIC
Bilirubin Urine: NEGATIVE
Glucose, UA: NEGATIVE mg/dL
Ketones, ur: NEGATIVE mg/dL
Nitrite: NEGATIVE
Protein, ur: 100 mg/dL — AB
Specific Gravity, Urine: 1.018 (ref 1.005–1.030)
WBC, UA: >50 WBC/hpf — ABNORMAL HIGH (ref 0–5)
pH: 6 (ref 5.0–8.0)

## 2021-12-11 LAB — GLUCOSE, CAPILLARY
Glucose-Capillary: 134 mg/dL — ABNORMAL HIGH (ref 70–99)
Glucose-Capillary: 146 mg/dL — ABNORMAL HIGH (ref 70–99)
Glucose-Capillary: 221 mg/dL — ABNORMAL HIGH (ref 70–99)
Glucose-Capillary: 152 mg/dL — ABNORMAL HIGH (ref 70–99)

## 2021-12-11 LAB — PROTIME-INR
INR: 1.2 (ref 0.8–1.2)
Prothrombin Time: 15.1 seconds (ref 11.4–15.2)

## 2021-12-11 MED ORDER — METFORMIN HCL 500 MG PO TABS
250.0000 mg | ORAL_TABLET | Freq: Every day | ORAL | Status: DC
  Administered 2021-12-11 – 2021-12-14 (×4): 250 mg via ORAL
  Filled 2021-12-11 (×4): qty 1

## 2021-12-11 MED ORDER — WARFARIN SODIUM 2.5 MG PO TABS
7.5000 mg | ORAL_TABLET | Freq: Once | ORAL | Status: AC
  Administered 2021-12-11: 7.5 mg via ORAL
  Filled 2021-12-11: qty 3

## 2021-12-11 NOTE — Evaluation (Signed)
Occupational Therapy Assessment and Plan  Patient Details  Name: [name] MRN: [id number] Date of Birth: 1955-07-14  OT Diagnosis: cognitive deficits and muscle weakness (generalized) Rehab Potential: Rehab Potential (ACUTE ONLY): Good ELOS: 10-14 days   Today's Date: 12/11/2021 OT Individual Time: 4098-1191 OT Individual Time Calculation (min): 30 min     Hospital Problem: Principal Problem:   Subdural hematoma, nontraumatic ([place])   Past Medical History:  Past Medical History:  Diagnosis Date   Abnormal chest CT    Anemia    Aortic atherosclerosis (H[facility])    Asthma    Atrial enlargement, left    severe   Atrial fibrillation ([place]) 01/05/2011   Chronic persistent, failed DCCV    Atrial flutter ([place])    Bronchospasm 1998   Cardiomegaly    Carotid bruit    Chronic diastolic congestive heart failure ([place])    Diabetes mellitus    Type II   Ejection fraction < 50%    35-40%,    Heart murmur    History of blood transfusion    History of radiation therapy 03/18/19-04/15/19   endometrial - vaginal brachytherapy -  Dr. Sondra Come    Hypercholesterolemia    Hypertension    Junctional rhythm 09/14/2018   Noted on EKG   Left bundle branch block (LBBB) 09/14/2018   Noted on EKG   LVH (left ventricular hypertrophy) 10/10/2016   Moderate, Noted on ECHO   Mitral regurgitation    Obesity    Obesity (BMI 30-39.9) 01/16/2012   Persistent atrial fibrillation ([place])    Polyp of rectum    Rheumatic fever 01/16/2012   Reported during childhood   S/P Maze operation for atrial fibrillation 02/15/2012   Complete biatrial lesion set using bipolar radiofrequency and cryothermy ablation with clipping of LA appendage   S/P mitral valve replacement with metallic valve 47/82/9562   88m Sorin [employer] Optiform mechanical prosthesis   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   254mEdwards mc3 ring annuloplasty   Shortness of breath    with exertion   Sleep apnea    DOES NOT HAVE CPAP    [id number]   Post op , left arm weakness   Tricuspid regurgitation 01/16/2012   Uterine cancer ([facility]   Past Surgical History:  Past Surgical History:  Procedure Laterality Date   CARDIAC CATHETERIZATION  >5 years   CARDIOVASCULAR STRESS TEST  09/2010   CARDIOVERSION  03/25/2011   Procedure: CARDIOVERSION;  Surgeon: JaJettie BoozeMD;  [facility] Service: Cardiovascular;  Laterality: N/A;   CARDIOVERSION N/A 07/27/2012   Procedure: CARDIOVERSION;  Surgeon: JaJettie BoozeMD;  [facility] Service: Cardiovascular;  Laterality: N/A;   CESAREAN SECTION     COLONOSCOPY WITH PROPOFOL N/A 04/27/2016   Procedure: COLONOSCOPY WITH PROPOFOL;  Surgeon: ViWilford CornerMD;  [facility];  Service: Endoscopy;  Laterality: N/A;   CRANIOTOMY Left 12/03/2021   Procedure: FRONTOTEMPORAL CRANIOTOMY FOR EVACUATION SUBDURAL HEMATOMA;  Surgeon: DaKarsten RoDO;  [facility] Service: Neurosurgery;  Laterality: Left;   INTRAOPERATIVE TRANSESOPHAGEAL ECHOCARDIOGRAM  02/15/2012   Procedure: INTRAOPERATIVE TRANSESOPHAGEAL ECHOCARDIOGRAM;  Surgeon: ClRexene AlbertsMD;  [facility] Service: Open Heart Surgery;  Laterality: N/A;   MAZE  02/15/2012   Procedure: MAZE;  Surgeon: ClRexene AlbertsMD;  [facility] Service: Open Heart Surgery;  Laterality: N/A;   MITRAL VALVE REPLACEMENT  02/15/2012   Procedure: MITRAL VALVE (MV) REPLACEMENT;  Surgeon: Rexene Alberts, MD;  Location: [facility];  Service: Open Heart Surgery;  Laterality: N/A;   ROBOTIC ASSISTED LAPAROSCOPIC HYSTERECTOMY AND SALPINGECTOMY Bilateral 01/02/2019   Procedure: XI ROBOTIC ASSISTED LAPAROSCOPIC TOTAL HYSTERECTOMY WITH BILATERAL SALPINGOOPHORECTOMY, SENTINEL LYMPH NODE BIOPSY;  Surgeon: [name], MD;  [facility];  Service: Gynecology;  Laterality: Bilateral;   TEE WITHOUT CARDIOVERSION  12/21/2011   Procedure: TRANSESOPHAGEAL ECHOCARDIOGRAM (TEE);  Surgeon: Jettie Booze, MD;  Location: [facility];  Service: Cardiovascular;  Laterality: N/A;   TOOTH EXTRACTION N/A 11/19/2021   Procedure: DENTAL EXTRACTION TEETH NUMBER 14, 19, 30, 31;  Surgeon: Diona Browner, DMD;  Location: [facility];  Service: Oral Surgery;  Laterality: N/A;   TRICUSPID VALVE REPLACEMENT  02/15/2012   Procedure: TRICUSPID VALVE REPAIR;  Surgeon: Rexene Alberts, MD;  Location: [facility];  Service: Open Heart Surgery;  Laterality: N/A;   TUBAL LIGATION     at time of her c-section    Assessment & Plan Clinical Impression:  [name] is a 66 year old female with hx of CAF, OSA, T2DM, CKD, MVR-chronic coumadin, hx post op L-MCA stroke 2014, dental extractions 11/19/21 with ongoing bleeding and admitted via ED on 12/02/21 with bleeding gums since procedure and reports of HA followed by somnolence and confusion. CT head done revealing acute on chronic SDH with 16 mm shift on the left and 6 mm shift on the right, 8 mm left to right midline shift and effacement of left greater than right sulci. INR  2.4 at admission and reversed with [facility]. Dr.Chandreskar consulted and recommended BP management and trial of heparin once cleared for [facility]. She started having difficulty with speech.  Dr. Reatha Armour consulted and patient underwent Left frontotemporal craniotomy for evacuation of hematoma on 11/24 and on Keppra for seizure prophylaxis.  Post op with low UOP requiring IVF and EVD removed on 11/25 as follow up CT stable. She was cleared to start IV heparin on 11/27 with recommendations to repeat CT once heparin therapeutic before transition to oral AC.    Patient with aphasia with paraphrasias, perseveration, delay in processing, balance deficits with left neglect and  tendency to vear to there right with ambulation. On 11/29, she had significant worsening of aphasia, problems with ADLs due to motor planning deficits, decreased lability to follow commands and more of a left bias with left gaze preference. 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 Repeat CT head showed mixed density left frontal parietal and temporal lobe 10 mm and unchanged and new small right temporal SDH unchanged. She was cleared to resume coumadin and INR 1.1 today. She has had improvement in motor planning with no overt left neglect or bias per PT notes from yesterday. Speech deficits felt to be due to sleep disruption? Therapy has been working with patient who continues to be limited by lethargy, motor planning deficits, continues to have minimal verbalization and decrease in carryover. CIR recommended due to functional decline. Patient transferred to CIR on 12/10/2021 .    Patient currently requires min with basic self-care skills secondary to muscle weakness, decreased cardiorespiratoy endurance, decreased initiation, decreased attention, decreased awareness, decreased problem solving, decreased safety awareness, decreased memory, and delayed processing, and decreased standing balance, decreased postural control, and decreased balance strategies.  Prior to hospitalization, patient could complete BADLs with independent .  Patient will benefit from skilled intervention to increase independence with basic self-care skills prior  to discharge home with care partner.  Anticipate patient will require 24 hour supervision and follow up home health.  OT - End of Session Activity Tolerance: Decreased this session Endurance Deficit: Yes OT Assessment Rehab Potential (ACUTE ONLY): Good OT Patient demonstrates impairments in the following area(s): Balance;Perception;Behavior;Safety;Cognition;Sensory;Edema;Skin Integrity;Endurance;Vision;Motor;Pain OT Basic ADL's Functional Problem(s): Grooming;Bathing;Dressing;Toileting OT Transfers Functional Problem(s): Toilet;Tub/Shower OT Additional Impairment(s): None OT Plan OT Intensity: Minimum of 1-2 x/day, 45 to 90 minutes OT  Frequency: 5 out of 7 days OT Duration/Estimated Length of Stay: 10-14 days OT Treatment/Interventions: Balance/vestibular training;Discharge planning;Functional electrical stimulation;Pain management;Self Care/advanced ADL retraining;Therapeutic Activities;UE/LE Coordination activities;Cognitive remediation/compensation;Disease mangement/prevention;Functional mobility training;Patient/family education;Therapeutic Exercise;Visual/perceptual remediation/compensation;Skin care/wound managment;Community reintegration;DME/adaptive equipment instruction;Neuromuscular re-education;Psychosocial support;Splinting/orthotics;UE/LE Strength taining/ROM;Wheelchair propulsion/positioning OT Self Feeding Anticipated Outcome(s): N/A OT Basic Self-Care Anticipated Outcome(s): supervision OT Toileting Anticipated Outcome(s): supervision OT Bathroom Transfers Anticipated Outcome(s): supervision OT Recommendation Patient destination: Home Follow Up Recommendations: Other (comment) (TBD) Equipment Recommended: To be determined   OT Evaluation Precautions/Restrictions  Precautions Precautions: Fall Restrictions Weight Bearing Restrictions: No General   Vital Signs  Pain Pain Assessment Pain Scale: 0-10 Pain Score: 0-No pain Home Living/Prior Functioning Home Living Family/patient expects to be discharged to:: Private residence (Patient opened her eyes when name was called. Patient did not respond to any questions.) Living Arrangements: Spouse/significant other Available Help at Discharge: Family, Available 24 hours/day Type of Home: House Home Access: Stairs to enter [employer] of Steps: 2 and 1 (no rails) Home Layout: One level Bathroom Shower/Tub: Tub/shower unit, Industrial/product designer: Yes  Lives With: Spouse, Son Vision Baseline Vision/History: 1 Wears glasses Vision Assessment?: Vision impaired- to be further tested in functional  context Additional Comments: Difficult to assess due to inconsistency with direction following and understanding verbal commuincation; possible R inattention Perception  Perception: Within Functional Limits Praxis Praxis: Intact Cognition Cognition Overall Cognitive Status: Impaired/Different from baseline Arousal/Alertness: Lethargic Orientation Level:  (pt unable to verbalize due to aphasia) Memory:  (pt unable to verbalize due to aphasia) Attention: Sustained Sustained Attention: Appears intact Awareness: Impaired Brief Interview for Mental Status (BIMS) Repetition of Three Words (First Attempt): No answer (pt unable to verbalize due to aphasia) Temporal Orientation: Year: No answer Temporal Orientation: Month: No answer Temporal Orientation: Day: No answer Recall: "Sock": No answer Recall: "Blue": No answer Recall: "Bed": No answer BIMS Summary Score: 99 Sensation Sensation Light Touch: Appears Intact Coordination Finger Nose Finger Test: Noticeably slow to complete. Unsure if it was solely due to lack of understanding or challenge with motor planning. Motor  Motor Motor - Skilled Clinical Observations: weakness  Trunk/Postural Assessment  Cervical Assessment Cervical Assessment: Within Functional Limits Thoracic Assessment Thoracic Assessment: Within Functional Limits Lumbar Assessment Lumbar Assessment: Within Functional Limits Postural Control Postural Control: Deficits on evaluation  Balance Balance Balance Assessed: Yes Dynamic Sitting Balance Dynamic Sitting - Balance Support: Feet supported;During functional activity Dynamic Sitting - Level of Assistance: 6: Modified independent (Device/Increase time) Static Standing Balance Static Standing - Balance Support: During functional activity Static Standing - Level of Assistance: 5: Stand by assistance Dynamic Standing Balance Dynamic Standing - Balance Support: During functional activity Dynamic Standing -  Level of Assistance: 4: Min assist Extremity/Trunk Assessment RUE Assessment RUE Assessment: Within Functional Limits General Strength Comments: 5/5 LUE Assessment LUE Assessment: Within Functional Limits General Strength Comments: 5/5  Care Tool Care Tool Self Care Eating   Eating Assist Level: Set up assist    Oral Care  Oral care, brush teeth, clean dentures activity did  not occur: Refused      Bathing   Body parts bathed by patient: Right arm;Left arm;Chest;Abdomen;Front perineal area;Right upper leg;Left upper leg;Face Body parts bathed by helper: Left lower leg;Right lower leg;Buttocks   Assist Level: Minimal Assistance - Patient > 75%    Upper Body Dressing(including orthotics)   What is the patient wearing?: Dress   Assist Level: Moderate Assistance - Patient 50 - 74%    Lower Body Dressing (excluding footwear)   What is the patient wearing?: Underwear/pull up Assist for lower body dressing: Minimal Assistance - Patient > 75%    Putting on/Taking off footwear   What is the patient wearing?: Non-skid slipper socks Assist for footwear: Total Assistance - Patient < 25%       Care Tool Toileting Toileting activity   Assist for toileting: Minimal Assistance - Patient > 75%     Care Tool Bed Mobility Roll left and right activity        Sit to lying activity   Sit to lying assist level: Supervision/Verbal cueing    Lying to sitting on side of bed activity   Lying to sitting on side of bed assist level: the ability to move from lying on the back to sitting on the side of the bed with no back support.: Supervision/Verbal cueing     Care Tool Transfers Sit to stand transfer   Sit to stand assist level: Minimal Assistance - Patient > 75%    Chair/bed transfer   Chair/bed transfer assist level: Minimal Assistance - Patient > 75%     Toilet transfer   Assist Level: Minimal Assistance - Patient > 75%     Care Tool Cognition  Expression of Ideas and Wants  Expression of Ideas and Wants: 1. Rarely/Never expressess or very difficult - rarely/never expresses self or speech is very difficult to understand  Understanding Verbal and Non-Verbal Content Understanding Verbal and Non-Verbal Content: 3. Usually understands - understands most conversations, but misses some part/intent of message. Requires cues at times to understand   Memory/Recall Ability     Refer to Care Plan for Long Term Goals  SHORT TERM GOAL WEEK 1 OT Short Term Goal 1 (Week 1): Client will visually scan environment to locate 3 items for self-care tasks. OT Short Term Goal 2 (Week 1): Pt will don shirt wwth no more than min assist. OT Short Term Goal 3 (Week 1): Pt will complete toilet transfer with CGA and min cues.  Recommendations for other services: None    Skilled Therapeutic Intervention OT evaluation completed. Discussed role of OT, therapy goals, therapy schedule, possible DME, and ELOS. Pt's husband present throughout. Pt completed functional mobility in room with min hand held assist and mod difficulty with navigating unfamiliar environment. OT placed self-care items on R and L side with pt able to locate. Pt demonstrated mild perseveration on washing UB during shower, requiring min cues for sequencing. Pt able to orient clothing, requiring mod assist to manage dress/pull over gown. Pt used head gestures throughout the evaluation due to expressive aphasia. Pt appeared to answer yes/no with overall fair+ skill. At end of session, pt left supine in bed with all needs in reach.   ADL ADL Eating: Set up Where Assessed-Eating: Wheelchair Grooming: Not assessed Upper Body Bathing: Setup;Minimal cueing Where Assessed-Upper Body Bathing: Shower Lower Body Bathing: Minimal assistance Where Assessed-Lower Body Bathing: Shower Upper Body Dressing: Moderate assistance Where Assessed-Upper Body Dressing: Edge of bed Lower Body Dressing: Minimal assistance Where Assessed-Lower Body  Dressing: Edge of bed Toileting: Minimal assistance Where Assessed-Toileting: Glass blower/designer: Minimal Print production planner Method: Stand pivot;Ambulating Science writer: Energy manager: Not assessed Social research officer, government: Environmental education officer Method: Heritage manager: Primary school teacher Sit to Stand: Minimal Assistance - Patient > 75% Stand to Sit: Minimal Assistance - Patient > 75%   Discharge Criteria: Patient will be discharged from OT if patient refuses treatment 3 consecutive times without medical reason, if treatment goals not met, if there is a change in medical status, if patient makes no progress towards goals or if patient is discharged from hospital.  The above assessment, treatment plan, treatment alternatives and goals were discussed and mutually agreed upon: by patient  Duayne Cal 12/11/2021, 11:41 AM

## 2021-12-11 NOTE — Evaluation (Addendum)
Physical Therapy Assessment and Plan  Patient Details  Name: [name] MRN: [id number] Date of Birth: 1955-11-06  PT Diagnosis: Abnormality of gait, Cognitive deficits, Coordination disorder, and Hemiplegia dominant Rehab Potential: Good ELOS: 7-9   Today's Date: 12/11/2021 PT Individual Time: 2263-3354 PT Individual Time Calculation (min): 90 min    Hospital Problem: Principal Problem:   Subdural hematoma, nontraumatic ([place])   Past Medical History:  Past Medical History:  Diagnosis Date   Abnormal chest CT    Anemia    Aortic atherosclerosis (HCC)    Asthma    Atrial enlargement, left    severe   Atrial fibrillation ([place]) 01/05/2011   Chronic persistent, failed DCCV    Atrial flutter ([place])    Bronchospasm 1998   Cardiomegaly    Carotid bruit    Chronic diastolic congestive heart failure ([place])    Diabetes mellitus    Type II   Ejection fraction < 50%    35-40%,    Heart murmur    History of blood transfusion    History of radiation therapy 03/18/19-04/15/19   endometrial - vaginal brachytherapy -  Dr. Sondra Come    Hypercholesterolemia    Hypertension    Junctional rhythm 09/14/2018   Noted on EKG   Left bundle branch block (LBBB) 09/14/2018   Noted on EKG   LVH (left ventricular hypertrophy) 10/10/2016   Moderate, Noted on ECHO   Mitral regurgitation    Obesity    Obesity (BMI 30-39.9) 01/16/2012   Persistent atrial fibrillation ([place])    Polyp of rectum    Rheumatic fever 01/16/2012   Reported during childhood   S/P Maze operation for atrial fibrillation 02/15/2012   Complete biatrial lesion set using bipolar radiofrequency and cryothermy ablation with clipping of LA appendage   S/P mitral valve replacement with metallic valve 56/25/6389   47m Sorin [employer] Optiform mechanical prosthesis   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   245mEdwards mc3 ring annuloplasty   Shortness of breath    with exertion   Sleep apnea    DOES NOT HAVE CPAP   [place]  [id number]   Post op , left arm weakness   Tricuspid regurgitation 01/16/2012   [facility]   Past Surgical History:  Past Surgical History:  Procedure Laterality Date   CARDIAC CATHETERIZATION  >5 years   CARDIOVASCULAR STRESS TEST  09/2010   CARDIOVERSION  03/25/2011   Procedure: CARDIOVERSION;  Surgeon: JaJettie BoozeMD;  Location: [facility] Service: Cardiovascular;  Laterality: N/A;   CARDIOVERSION N/A 07/27/2012   Procedure: CARDIOVERSION;  Surgeon: JaJettie BoozeMD;  [facility];  Service: Cardiovascular;  Laterality: N/A;   CESAREAN SECTION     COLONOSCOPY WITH PROPOFOL N/A 04/27/2016   Procedure: COLONOSCOPY WITH PROPOFOL;  Surgeon: ViWilford CornerMD;  [facility];  Service: Endoscopy;  Laterality: N/A;   CRANIOTOMY Left 12/03/2021   Procedure: FRONTOTEMPORAL CRANIOTOMY FOR EVACUATION SUBDURAL HEMATOMA;  Surgeon: DaKarsten RoDO;  [facility]: [facility] Service: Neurosurgery;  Laterality: Left;   INTRAOPERATIVE TRANSESOPHAGEAL ECHOCARDIOGRAM  02/15/2012   Procedure: INTRAOPERATIVE TRANSESOPHAGEAL ECHOCARDIOGRAM;  Surgeon: ClRexene AlbertsMD;  Location: [facility] Service: Open Heart Surgery;  Laterality: N/A;   MAZE  02/15/2012   Procedure: MAZE;  Surgeon: ClRexene AlbertsMD;  Location: [facility] Service: Open Heart Surgery;  Laterality: N/A;   MITRAL VALVE REPLACEMENT  02/15/2012   Procedure: MITRAL VALVE (MV) REPLACEMENT;  Surgeon: ClBraulio Conte  Keturah Barre, MD;  Location: [place];  Service: Open Heart Surgery;  Laterality: N/A;   ROBOTIC ASSISTED LAPAROSCOPIC HYSTERECTOMY AND SALPINGECTOMY Bilateral 01/02/2019   Procedure: XI ROBOTIC ASSISTED LAPAROSCOPIC TOTAL HYSTERECTOMY WITH BILATERAL SALPINGOOPHORECTOMY, SENTINEL LYMPH NODE BIOPSY;  Surgeon: [name], MD;  Location: [facility];  Service: Gynecology;  Laterality: Bilateral;   TEE WITHOUT CARDIOVERSION  12/21/2011   Procedure: TRANSESOPHAGEAL ECHOCARDIOGRAM (TEE);  Surgeon: Jettie Booze,  MD;  Location: [place];  Service: Cardiovascular;  Laterality: N/A;   TOOTH EXTRACTION N/A 11/19/2021   Procedure: DENTAL EXTRACTION TEETH NUMBER 14, 19, 30, 31;  Surgeon: Diona Browner, DMD;  Location: [place];  Service: Oral Surgery;  Laterality: N/A;   TRICUSPID VALVE REPLACEMENT  02/15/2012   Procedure: TRICUSPID VALVE REPAIR;  Surgeon: Rexene Alberts, MD;  Location: [place];  Service: Open Heart Surgery;  Laterality: N/A;   TUBAL LIGATION     at time of her c-section    Assessment & Plan Clinical Impression: [name] is a 66 year old female with hx of CAF, OSA, T2DM, CKD, MVR-chronic coumadin, hx post op L-MCA stroke 2014, dental extractions 11/19/21 with ongoing bleeding and admitted via ED on 12/02/21 with bleeding gums since procedure and reports of HA followed by somnolence and confusion. CT head done revealing acute on chronic SDH with 16 mm shift on the left and 6 mm shift on the right, 8 mm left to right midline shift and effacement of left greater than right sulci. INR  2.4 at admission and reversed with [facility]. Dr.Chandreskar consulted and recommended BP management and trial of heparin once cleared for [facility]. She started having difficulty with speech.  Dr. Reatha Armour consulted and patient underwent Left frontotemporal craniotomy for evacuation of hematoma on 11/24 and on Keppra for seizure prophylaxis.  Post op with low UOP requiring IVF and EVD removed on 11/25 as follow up CT stable. She was cleared to start IV heparin on 11/27 with recommendations to repeat CT once heparin therapeutic before transition to oral AC.    Patient with aphasia with paraphrasias, perseveration, delay in processing, balance deficits with left neglect and  tendency to vear to there right with ambulation. On 11/29, she had significant worsening of aphasia, problems with ADLs due to motor planning deficits, decreased lability to follow commands and more of a left bias with left gaze preference. EEG done showing area  of epileptogenicity arising from left frontal region with cortical dysfunction in left hemisphere. Repeat CT head showed mixed density left frontal parietal and temporal lobe 10 mm and unchanged and new small right temporal SDH unchanged. She was cleared to resume coumadin and INR 1.1 today. She has had improvement in motor planning with no overt left neglect or bias per PT notes from yesterday. Speech deficits felt to be due to sleep disruption? Therapy has been working with patient who continues to be limited by lethargy, motor planning deficits, continues to have minimal verbalization and decrease in carryover.Patient transferred to CIR on 12/10/2021 .   Patient currently requires min with mobility secondary to impaired timing and sequencing, motor apraxia, decreased coordination, and decreased motor planning, decreased attention, decreased awareness, and delayed processing, and decreased sitting balance, decreased standing balance, decreased postural control, hemiplegia, and decreased balance strategies.  Prior to hospitalization, patient was independent  with mobility and lived with Spouse, Son in a House home.  Home access is 2 and 1 (no rails)Stairs to enter.  Patient will benefit from skilled PT intervention  to maximize safe functional mobility, minimize fall risk, and decrease caregiver burden for planned discharge home with 24 hour supervision.  Anticipate patient will benefit from follow up [place] at discharge.  PT - End of Session Activity Tolerance: Tolerates 30+ min activity with multiple rests Endurance Deficit: Yes (fatigued after PT eval) PT Assessment Rehab Potential (ACUTE/IP ONLY): Good PT Patient demonstrates impairments in the following area(s): Balance;Motor;Safety;Endurance;Perception PT Transfers Functional Problem(s): Bed Mobility;Bed to Chair;Car;Furniture PT Locomotion Functional Problem(s): Ambulation;Stairs;Wheelchair Mobility PT Plan PT Intensity: Minimum of 1-2 x/day ,45 to  90 minutes PT Frequency: 5 out of 7 days PT Duration Estimated Length of Stay: 7-9 PT Treatment/Interventions: Ambulation/gait training;Balance/vestibular training;Community reintegration;Cognitive remediation/compensation;Discharge planning;DME/adaptive equipment instruction;Functional mobility training;Neuromuscular re-education;Patient/family education;Psychosocial support;Splinting/orthotics;Stair training;Therapeutic Activities;Therapeutic Exercise;UE/LE Strength taining/ROM;UE/LE Coordination activities;Wheelchair propulsion/positioning PT Transfers Anticipated Outcome(s): supervision basic and car PT Locomotion Anticipated Outcome(s): supervision household x 50' wiht LRAD; supervsion controlled and community settings x 150' wiht LRAD; supervision up/down 12 steps bil rails; min assist to negotiate 2 +1 steps to enter home without rails PT Recommendation Recommendations for Other Services: Other (comment) Follow Up Recommendations: Home health PT Patient destination: Home Equipment Details: unknown if pt owns any   PT Evaluation Precautions/Restrictions -falls   General   Vital SignsTherapy Vitals Temp: 98.1 F (36.7 C) Temp Source: Oral Pulse Rate: 79 Resp: 18 BP: (!) 124/55 Patient Position (if appropriate): Lying Oxygen Therapy SpO2: 99 % O2 Device: Room Air Pain Pain Assessment Pain Score: 0-No pain Pain Interference Pain Interference Pain Effect on Sleep: 8. Unable to answer Pain Interference with Therapy Activities: 8. Unable to answer Pain Interference with Day-to-Day Activities: 8. Unable to answer Home Living/Prior Functioning Home Living Available Help at Discharge: Family;Available 24 hours/day Type of Home: House  Lives With: Spouse;Son Prior Function Vocation: Retired Vision/Perception wears glasses possibly; difficult to assess due to aphasia    Cognition Overall Cognitive Status: Impaired/Different from baseline Arousal/Alertness:  Lethargic Orientation Level: Oriented to person Attention: Sustained Sustained Attention: Appears intact Memory: Impaired (hard to assess due to language) Awareness: Impaired Awareness Impairment: Intellectual impairment;Emergent impairment Problem Solving: Impaired (hard to assess due to language) Sensation Sensation Light Touch: Appears Intact Additional Comments: difficult to assess due to expressive aphasia and perseveration on nodding head yes. Coordination Gross Motor Movements are Fluid and Coordinated: No Heel Shin Test: decreased speed and accuracy RLE Motor  Motor Motor: Hemiplegia;Motor apraxia   Trunk/Postural Assessment  Cervical Assessment Cervical Assessment: Exceptions to [facility] (forward head) Thoracic Assessment Thoracic Assessment: Exceptions to [facility] (R shoulder visibly lower) Lumbar Assessment Lumbar Assessment: Exceptions to [facility] (limited rotation) Postural Control Postural Control: Deficits on evaluation (delayd and inadequate balance strategies)  Balance Balance Balance Assessed: Yes Dynamic Sitting Balance Dynamic Sitting - Balance Support: Feet supported;During functional activity Dynamic Sitting - Level of Assistance: 5: Stand by assistance Reach (Patient is able to reach ___ inches to right, left, forward, back): 2 Dynamic Sitting - Balance Activities: Lateral lean/weight shifting;Forward lean/weight shifting Static Standing Balance Static Standing - Balance Support: During functional activity Static Standing - Level of Assistance: 5: Stand by assistance Dynamic Standing Balance Dynamic Standing - Balance Support: During functional activity Dynamic Standing - Level of Assistance: 4: Min assist Dynamic Standing - Balance Activities: Lateral lean/weight shifting;Forward lean/weight shifting Extremity Assessment         Care Tool Care Tool Bed Mobility Roll left and right activity   Roll left and right assist level: Supervision/Verbal cueing     Sit to lying activity   Sit  to lying assist level: Supervision/Verbal cueing    Lying to sitting on side of bed activity   Lying to sitting on side of bed assist level: the ability to move from lying on the back to sitting on the side of the bed with no back support.: Supervision/Verbal cueing     Care Tool Transfers Sit to stand transfer   Sit to stand assist level: Contact Guard/Touching assist    Chair/bed transfer   Chair/bed transfer assist level: Contact Guard/Touching assist     Toilet transfer        Car transfer   Car transfer assist level: Minimal Assistance - Patient > 75%      Care Tool Locomotion Ambulation   Assist level: Contact Guard/Touching assist Assistive device: Hand held assist Max distance: 150  Walk 10 feet activity   Assist level: Contact Guard/Touching assist Assistive device: Hand held assist   Walk 50 feet with 2 turns activity   Assist level: Contact Guard/Touching assist Assistive device: Hand held assist  Walk 150 feet activity   Assist level: Contact Guard/Touching assist Assistive device: Hand held assist  Walk 10 feet on uneven surfaces activity   Assist level: Minimal Assistance - Patient > 75% Assistive device: Hand held assist  Stairs   Assist level: Contact Guard/Touching assist Stairs assistive device: 2 hand rails Max number of stairs: 12  Walk up/down 1 step activity        Walk up/down 4 steps activity   Walk up/down 4 steps assist level: Contact Guard/Touching assist Walk up/down 4 steps assistive device: 2 hand rails  Walk up/down 12 steps activity   Walk up/down 12 steps assist level: Contact Guard/Touching assist Walk up/down 12 steps assistive device: 2 hand rails  Pick up small objects from floor   Pick up small object from the floor assist level: Minimal Assistance - Patient > 75% Pick up small object from the floor assistive device: none; HHA  Wheelchair Is the patient using a wheelchair?: Yes Type of  Wheelchair: Manual   Wheelchair assist level: Supervision/Verbal cueing Max wheelchair distance: 150  Wheel 50 feet with 2 turns activity   Assist Level: Supervision/Verbal cueing  Wheel 150 feet activity   Assist Level: Supervision/Verbal cueing    Refer to Care Plan for [place] PT Short Term Goal 1 (Week 1): = LTGs due to ELOS  Recommendations for other services: None   Skilled Therapeutic Intervention With multimodal cues for instruction:   Therapeutic activities for RUE use and R attention: seated in wc, reaching out of BOS to R to retrieve bean bags by color and toss into bin, 9/10 accurate for color. And also with R hand, use of 3 colors of clothes pins on 3 different rods, accurate 20/20 pins; PT had to hold pt's L hand for forced use of R.  Use of bil hands with gloves on, to wipe down 10 bean bags placed on her R; accurate and complete.  neuromuscular re-education (in standing with bil UE support )via demo and multimodal cues for bil heel/toe raises, and mini squats.  Pt demonstrates limited eccentric control of R hamstrings during extension.  At conclusion of session, pt resting in bed with needs at hand and alarm set.   Mobility Bed Mobility Bed Mobility: Rolling Right;Rolling Left;Left Sidelying to Sit;Sit to Supine Rolling Right: Supervision/verbal cueing Rolling Left: Supervision/Verbal cueing Left Sidelying to Sit: Supervision/Verbal cueing Sit to Supine: Supervision/Verbal cueing Transfers Transfers: Sit to  Stand;Stand to Sit;Stand Pivot Transfers Sit to Stand: Contact Guard/Touching assist Stand to Sit: Contact Guard/Touching assist Stand Pivot Transfers: Contact Guard/Touching assist Transfer (Assistive device): None Locomotion  Gait Ambulation: Yes Gait Assistance: Contact Guard/Touching assist Gait Distance (Feet): 150 Feet Assistive device: None;1 person hand held assist Gait Gait: Yes Gait Pattern: Impaired Gait  Pattern: Decreased step length - right;Decreased step length - left;Decreased hip/knee flexion - left;Decreased hip/knee flexion - right;Decreased dorsiflexion - right;Narrow base of support Gait velocity: slow for age 17 / Additional Locomotion Stairs: Yes Stairs Assistance: Contact Guard/Touching assist Stair Management Technique: Two rails Number of Stairs: 12 Height of Stairs: 6 (and 4") Ramp: Nurse, mental health Mobility: Yes Wheelchair Assistance: Minimal assistance - Patient >75% Wheelchair Propulsion: Both upper extremities Wheelchair Parts Management: Needs assistance Distance: 150   Discharge Criteria: Patient will be discharged from PT if patient refuses treatment 3 consecutive times without medical reason, if treatment goals not met, if there is a change in medical status, if patient makes no progress towards goals or if patient is discharged from hospital.  The above assessment, treatment plan, treatment alternatives and goals were discussed and mutually agreed upon: by patient  Oza Oberle 12/11/2021, 6:04 PM

## 2021-12-11 NOTE — Progress Notes (Signed)
ANTICOAGULATION CONSULT NOTE - Follow Up Consult  Pharmacy Consult for Warfarin Indication:  mechanical mitral valve and atrial fibrillation  Allergies  Allergen Reactions   Chlorhexidine Other (See Comments)    Unknown    Patient Measurements: Height: '5\' 6"'$  (167.6 cm) Weight: 95 kg (209 lb 7 oz) IBW/kg (Calculated) : 59.3   Vital Signs: Temp: 99 F (37.2 C) (12/02 0422) BP: 112/55 (12/02 0422) Pulse Rate: 80 (12/02 0422)  Labs: Recent Labs    12/08/21 2054 12/09/21 0743 12/09/21 1400 12/10/21 0318 12/11/21 0817  HGB  --  8.7*  --  9.4*  --   HCT  --  26.9*  --  28.9*  --   PLT  --  168  --  179  --   LABPROT  --   --  14.2 14.3 15.1  INR  --   --  1.1 1.1 1.2  HEPARINUNFRC 0.40 0.39  --  0.33  --     Estimated Creatinine Clearance: 72.1 mL/min (by C-G formula based on SCr of 0.88 mg/dL).  Assessment: 49 YOF with history of mechanical MVR in 2014 and Afib on [place] PTA admitted 11/23 with bleeding gums s/p teeth removal on 11/19/21. CT on admit also showed acute subdural hematoma with midline shift. She received KCentra, Vitamin K '10mg'$  and TXA on 11/23. S/p craniotomy for evacuation on 12/03/21. Pharmacy previously consulted to dose IV heparin > changed to treatment dose Enoxaparin on 12/1.  Cleared to resume Warfarin on 11/30 per Neurosurgery, dosing per Pharmacy.   INR 1.1>1.2 after Warfarin 6 mg then 7.5 mg the last 2 days.  PTA warfarin regimen: 6 mg daily.  Goal of Therapy:  INR 2.5-3.5 Monitor platelets by anticoagulation protocol: Yes   Plan:  Warfarin 7.5 mg x 1 again today. Daily PT/INR. Enoxaparin 95 mg (1 mg/kg) SQ q12h until INR at goal. Next CBC and chemistries 12/4.  Arty Baumgartner, [place] 12/11/2021,12:12 PM

## 2021-12-11 NOTE — Progress Notes (Signed)
PROGRESS NOTE   Subjective/Complaints:  Pt up in bed. Seems to have slept well. Family in room. Therapy about to start.   ROS: limited due to language/communication   Objective:   No results found. Recent Labs    12/09/21 0743 12/10/21 0318  WBC 6.3 8.4  HGB 8.7* 9.4*  HCT 26.9* 28.9*  PLT 168 179   No results for input(s): "NA", "K", "CL", "CO2", "GLUCOSE", "BUN", "CREATININE", "CALCIUM" in the last 72 hours.  Intake/Output Summary (Last 24 hours) at 12/11/2021 1041 Last data filed at 12/11/2021 0900 Gross per 24 hour  Intake 240 ml  Output --  Net 240 ml        Physical Exam: Vital Signs Blood pressure (!) 112/55, pulse 80, temperature 99 F (37.2 C), resp. rate 18, height '5\' 6"'$  (1.676 m), weight 95 kg, SpO2 93 %.  General: Alert and oriented x 3, No apparent distress HEENT: Head is normocephalic, staples along left craniotomy incision--CDI, PERRLA, EOMI, sclera anicteric, oral mucosa pink and moist, dentition intact, ext ear canals clear,  Neck: Supple without JVD or lymphadenopathy Heart: Reg rate and rhythm. + murmurs, no rubs or gallops Chest: CTA bilaterally without wheezes, rales, or rhonchi; no distress Abdomen: Soft, non-tender, non-distended, bowel sounds positive. Extremities: No clubbing, cyanosis, or edema. Pulses are 2+ Psych: Pt's affect is appropriate. Pt is cooperative Skin: Clean and intact without signs of breakdown Neuro:  Pt is alert,right central 7. Sl left gaze preference. Follows simple commands. Can speak in one or two word replies, usually dysarthric. Typically nods "yes" to most questions unless cued otherwise. Was able to correct herself once or twice. 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 Can move all 4's, L>R. Senses LT/PP in all 4's. No abnl tone Musculoskeletal: normal PROM, no jt pain    Assessment/Plan: 1. Functional deficits which require 3+ hours per day of  interdisciplinary therapy in a comprehensive inpatient rehab setting. Physiatrist is providing close team supervision and 24 hour management of active medical problems listed below. Physiatrist and rehab team continue to assess barriers to discharge/monitor patient progress toward functional and medical goals  Care Tool:  Bathing              Bathing assist       Upper Body Dressing/Undressing Upper body dressing        Upper body assist      Lower Body Dressing/Undressing Lower body dressing            Lower body assist       Toileting Toileting    Toileting assist       Transfers Chair/bed transfer  Transfers assist           Locomotion Ambulation   Ambulation assist              Walk 10 feet activity   Assist           Walk 50 feet activity   Assist           Walk 150 feet activity   Assist           Walk 10 feet on uneven surface  activity   Assist  Wheelchair     Assist               Wheelchair 50 feet with 2 turns activity    Assist            Wheelchair 150 feet activity     Assist          Blood pressure (!) 112/55, pulse 80, temperature 99 F (37.2 C), resp. rate 18, height '5\' 6"'$  (1.676 m), weight 95 kg, SpO2 93 %.  Medical Problem List and Plan: 1. Functional deficits secondary to spontaneous bilateral SDH d/t anticoagulation. Pt is s/p left fronto-temporal craniotomy on 11/24.              -patient may shower             -ELOS/Goals: 10-14 days, supervision to min assist goals with PT, OT, SLP  -Patient is beginning CIR therapies today including PT, OT, and SLP  2.  Mechanical mitral valve/Antithrombotics: -DVT/anticoagulation:  Pharmaceutical: Coumadin and Lovenox bridge till INR therapeutic.              -antiplatelet therapy: N/A 3. Pain Management:  Tylenol prn.  4. Mood/Behavior/Sleep: LCSW to follow for evaluation and support.               -antipsychotic agents: N/A 5. Neuropsych/cognition: This patient is not capable of making decisions on her own behalf. 6. Skin/Wound Care: Routine pressure relief measures.  7. Fluids/Electrolytes/Nutrition: Monitor I/O. Will need assistance with meals.              -pt eating with cues, inconsistent but ate 100% this am --Check CMET on 12/04 8. H/o A fib: Monitor HR TID. Continue amiodarone and coreg BID 9. T2DM: Hgb A1c- 5.8 and well controlled on SSI at this time             --was on metformin PTA.   CBG (last 3)  Recent Labs    12/10/21 1642 12/10/21 2111 12/11/21 0608  GLUCAP 163* 215* 134*    12/2-resume metformin '250mg'$  daily 10 ABLA: Monitor for signs of bleeding. Has had intermittent drop in platelets.  --Recheck CBC on 12/04  11. Seizure prophylaxis: Continues on Keppra 500  mg BID.              --monitor for seizures.  12. HTN: Monitor BP TID--on coreg, Norvasc and Avapro  12/2 controlled 13. Urinary retention:  Has been incontinent of urine. Toilet patient while awake.  --Per records cathed for 800 cc on 11/29. Monitor voiding with PVR checks.    12/2 still requiring occ cath-800cc yesterday afternoon.   LOS: 1 days A FACE TO FACE EVALUATION WAS PERFORMED  [name] 12/11/2021, 10:41 AM

## 2021-12-11 NOTE — Evaluation (Addendum)
Speech Language Pathology Assessment and Plan  Patient Details  Name: [name] MRN: [id number] Date of Birth: 09/12/1955  SLP Diagnosis: Aphasia;Cognitive Impairments  Rehab Potential: Good ELOS: 10-14    Today's Date: 12/11/2021 SLP Individual Time: 6761-9509 SLP Individual Time Calculation (min): 16 min   Hospital Problem: Principal Problem:   Subdural hematoma, nontraumatic ([place])  Past Medical History:  Past Medical History:  Diagnosis Date   Abnormal chest CT    Anemia    Aortic atherosclerosis (HCC)    Asthma    Atrial enlargement, left    severe   Atrial fibrillation ([place]) 01/05/2011   Chronic persistent, failed DCCV    Atrial flutter ([place])    Bronchospasm 1998   Cardiomegaly    Carotid bruit    Chronic diastolic congestive heart failure ([place])    Diabetes mellitus    Type II   Ejection fraction < 50%    35-40%,    Heart murmur    History of blood transfusion    History of radiation therapy 03/18/19-04/15/19   endometrial - vaginal brachytherapy -  Dr. Sondra Come    Hypercholesterolemia    Hypertension    Junctional rhythm 09/14/2018   Noted on EKG   Left bundle branch block (LBBB) 09/14/2018   Noted on EKG   LVH (left ventricular hypertrophy) 10/10/2016   Moderate, Noted on ECHO   Mitral regurgitation    Obesity    Obesity (BMI 30-39.9) 01/16/2012   Persistent atrial fibrillation ([place])    Polyp of rectum    Rheumatic fever 01/16/2012   Reported during childhood   S/P Maze operation for atrial fibrillation 02/15/2012   Complete biatrial lesion set using bipolar radiofrequency and cryothermy ablation with clipping of LA appendage   S/P mitral valve replacement with metallic valve 32/67/1245   43m Sorin [employer] Optiform mechanical prosthesis   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   28mEdwards mc3 ring annuloplasty   Shortness of breath    with exertion   Sleep apnea    DOES NOT HAVE CPAP   [id number]   Post op , left arm weakness    Tricuspid regurgitation 01/16/2012   [place] [place]   Past Surgical History:  Past Surgical History:  Procedure Laterality Date   CARDIAC CATHETERIZATION  >5 years   CARDIOVASCULAR STRESS TEST  09/2010   CARDIOVERSION  03/25/2011   Procedure: CARDIOVERSION;  Surgeon: JaJettie BoozeMD;  [facility] Service: Cardiovascular;  Laterality: N/A;   CARDIOVERSION N/A 07/27/2012   Procedure: CARDIOVERSION;  Surgeon: JaJettie BoozeMD;  [facility];  Service: Cardiovascular;  Laterality: N/A;   CESAREAN SECTION     COLONOSCOPY WITH PROPOFOL N/A 04/27/2016   Procedure: COLONOSCOPY WITH PROPOFOL;  Surgeon: ViWilford CornerMD;  [facility];  Service: Endoscopy;  Laterality: N/A;   CRANIOTOMY Left 12/03/2021   Procedure: FRONTOTEMPORAL CRANIOTOMY FOR EVACUATION SUBDURAL HEMATOMA;  Surgeon: DaKarsten RoDO;  [facility] Service: Neurosurgery;  Laterality: Left;   INTRAOPERATIVE TRANSESOPHAGEAL ECHOCARDIOGRAM  02/15/2012   Procedure: INTRAOPERATIVE TRANSESOPHAGEAL ECHOCARDIOGRAM;  Surgeon: ClRexene AlbertsMD;  [facility] Service: Open Heart Surgery;  Laterality: N/A;   MAZE  02/15/2012   Procedure: MAZE;  Surgeon: ClRexene AlbertsMD;  [facility] Service: Open Heart Surgery;  Laterality: N/A;   MITRAL VALVE REPLACEMENT  02/15/2012   Procedure: MITRAL VALVE (MV) REPLACEMENT;  Surgeon: ClRexene AlbertsMD;  [place]  Service: Open Heart Surgery;  Laterality: N/A;   ROBOTIC ASSISTED LAPAROSCOPIC HYSTERECTOMY AND SALPINGECTOMY Bilateral 01/02/2019   Procedure: XI ROBOTIC ASSISTED LAPAROSCOPIC TOTAL HYSTERECTOMY WITH BILATERAL SALPINGOOPHORECTOMY, SENTINEL LYMPH NODE BIOPSY;  Surgeon: [name], MD;  Location[facility];  Service: Gynecology;  Laterality: Bilateral;   TEE WITHOUT CARDIOVERSION  12/21/2011   Procedure: TRANSESOPHAGEAL ECHOCARDIOGRAM (TEE);  Surgeon: Jettie Booze, MD;  Location: [place];  Service:  Cardiovascular;  Laterality: N/A;   TOOTH EXTRACTION N/A 11/19/2021   Procedure: DENTAL EXTRACTION TEETH NUMBER 14, 19, 30, 31;  Surgeon: Diona Browner, DMD;  Location: [place];  Service: Oral Surgery;  Laterality: N/A;   TRICUSPID VALVE REPLACEMENT  02/15/2012   Procedure: TRICUSPID VALVE REPAIR;  Surgeon: Rexene Alberts, MD;  [place];  Service: Open Heart Surgery;  Laterality: N/A;   TUBAL LIGATION     at time of her c-section    Assessment / Plan / Recommendation Clinical Impression  [name] is a 66 year old female with hx of CAF, OSA, T2DM, CKD, MVR-chronic coumadin, hx post op L-MCA stroke 2014, dental extractions 11/19/21 with ongoing bleeding and admitted via ED on 12/02/21 with bleeding gums since procedure and reports of HA followed by somnolence and confusion. CT head done revealing acute on chronic SDH with 16 mm shift on the left and 6 mm shift on the right, 8 mm left to right midline shift and effacement of left greater than right sulci. INR  2.4 at admission and reversed with [facility]. Dr.Chandreskar consulted and recommended BP management and trial of heparin once cleared for [facility]. She started having difficulty with speech.  Dr. Reatha Armour consulted and patient underwent Left frontotemporal craniotomy for evacuation of hematoma on 11/24 and on Keppra for seizure prophylaxis.  Post op with low UOP requiring IVF and EVD removed on 11/25 as follow up CT stable. She was cleared to start IV heparin on 11/27 with recommendations to repeat CT once heparin therapeutic before transition to oral AC.    Patient with aphasia with paraphrasias, perseveration, delay in processing, balance deficits with left neglect and  tendency to vear to there right with ambulation. On 11/29, she had significant worsening of aphasia, problems with ADLs due to motor planning deficits, decreased lability to follow commands and more of a left bias with left gaze preference. EEG done showing area of epileptogenicity  arising from left frontal region with cortical dysfunction in left hemisphere. Repeat CT head showed mixed density left frontal parietal and temporal lobe 10 mm and unchanged and new small right temporal SDH unchanged. She was cleared to resume coumadin and INR 1.1 today. She has had improvement in motor planning with no overt left neglect or bias per PT notes from yesterday. Speech deficits felt to be due to sleep disruption? Therapy has been working with patient who continues to be limited by lethargy, motor planning deficits, continues to have minimal verbalization and decrease in carryover. CIR recommended due to functional decline.   Pt presents with severe expressive and moderate receptive aphasia. Pt's husband supports typical nap around evaluation time and reported reduction in language skills impacted by fatigue, further supported by chart review with reduce accuracy noted today verse on previous treatments. Pt demonstrated appropriate greetings, counted 1-5 with visual aids only, and named 1/7 days of the week with written visuals. Pt was unable to name objects at this date. Pt perseverated on the word "fine" and variations of initial /f/ words. Pt response to yes/no  questions nonverbally or with a yes "mmh." Pt demonstrated 80% accuary on simple yes/no questions and 40% accuracy on complex yes/no question on WABB, however functionally demonstrated 60% accuracy, favoring yes. Pt could identify objects in a field of 2-3 with 100% accuracy and extra time. Pt could match objects to words in a field of 2 with 1/3 accuracy increasing to 3/3 with max A semantic cues. Pt could write first name and 3/5 letters via dictation in last name with ability to trace letters and copy word. Pt was able to repeat at word level with 30% accuracy with max A multimodal cues.Pt demonstrated increase awareness of error in written verse verbal tasks.  Pt demonstrates reduced error awareness, reduced expressive language, reduced  receptive language and likely cognitive deficits in memory, attention and problem solving. Pt's husband supports no acute deficits in speech nor swallow function. SLP will address language deficits prior to cognitive deficits. Pt would benefit from skilled ST services in order to maximize functional independence and reduce burden of care, requiring 24 hour supervision at discharge with continued skilled ST services.    Skilled Therapeutic Interventions          Skilled ST services focused on cognitive skills. SLP facilitated administration of cognitive linguistic formal-informal assessment and provided education of results. SLP and pt collaborated to set goals for cognitive linguistic needs during length of stay. All questions answered to satisfaction. SLP facilitated written communication, providing white board. Pt was able to write first name, then continue to perseverating writing "[place] Kid[place]." SLP was able to redirect finally by having pt trace initial letter of last name. Pt was then able to write 3/5 letters in last name via dictation. Pt was left in room with husband, call bell within reach and chair alarm set. SLP recommends to continue skilled services.    SLP Assessment  Patient will need skilled [place] [facility] during CIR admission    Recommendations  Oral Care Recommendations: Oral care BID Patient destination: Home Follow up Recommendations: 24 hour supervision/assistance;Outpatient SLP;Skilled Nursing facility    SLP Frequency 3 to 5 out of 7 days   SLP Duration  SLP Intensity  SLP Treatment/Interventions 10-14  Minumum of 1-2 x/day, 30 to 90 minutes  Cueing hierarchy;Functional tasks;Multimodal communication approach;Speech/Language facilitation;Patient/family education    Pain Pain Assessment Pain Scale: 0-10 Pain Score: 0-No pain  Prior Functioning Type of Home: House  Lives With: Spouse;Son Available Help at Discharge: Family;Available 24  hours/day  SLP Evaluation Cognition Overall Cognitive Status: Impaired/Different from baseline Arousal/Alertness: Lethargic Attention: Sustained Sustained Attention: Appears intact Memory:  (pt unable to verbalize due to aphasia) Awareness: Impaired  Comprehension   Expression   Oral Motor    Care Tool Care Tool Cognition Ability to hear (with hearing aid or hearing appliances if normally used Ability to hear (with hearing aid or hearing appliances if normally used): 0. Adequate - no difficulty in normal conservation, social interaction, listening to TV   Expression of Ideas and Wants Expression of Ideas and Wants: 1. Rarely/Never expressess or very difficult - rarely/never expresses self or speech is very difficult to understand   Understanding Verbal and Non-Verbal Content Understanding Verbal and Non-Verbal Content: 2. Sometimes understands - understands only basic conversations or simple, direct phrases. Frequently requires cues to understand  Memory/Recall Ability      Short Term Goals: Week 1: SLP Short Term Goal 1 (Week 1): Pt will demonstrate simple object naming and automatic language with 40% accuracy with max A semantic/phonemic/sentence  completion cues. SLP Short Term Goal 2 (Week 1): Pt will response to yes/no questions pertaining to biographical/immediate environment information with 80% accuracy with max A multimodal cues. SLP Short Term Goal 3 (Week 1): Pt will repeat at word level with 40% accuracy given max A multimodal cues. SLP Short Term Goal 4 (Week 1): Pt will write 3-4 letter words with mod A multimodal cues. SLP Short Term Goal 5 (Week 1): Pt will match objects to words in a field of 2-3 with 90% accuract extra time and mod A verbal cues. SLP Short Term Goal 6 (Week 1): Pt will self recognize verbal errors in 40% of opportunties with max A multimodal cues.  Refer to Care Plan for Long Term Goals  Recommendations for other services: None   Discharge  Criteria: Patient will be discharged from SLP if patient refuses treatment 3 consecutive times without medical reason, if treatment goals not met, if there is a change in medical status, if patient makes no progress towards goals or if patient is discharged from hospital.  The above assessment, treatment plan, treatment alternatives and goals were discussed and mutually agreed upon: by patient  [place]  [facility] 12/11/2021, 12:55 PM

## 2021-12-12 DIAGNOSIS — I10 Essential (primary) hypertension: Secondary | ICD-10-CM | POA: Diagnosis not present

## 2021-12-12 DIAGNOSIS — I6200 Nontraumatic subdural hemorrhage, unspecified: Secondary | ICD-10-CM | POA: Diagnosis not present

## 2021-12-12 DIAGNOSIS — D62 Acute posthemorrhagic anemia: Secondary | ICD-10-CM | POA: Diagnosis not present

## 2021-12-12 DIAGNOSIS — E1169 Type 2 diabetes mellitus with other specified complication: Secondary | ICD-10-CM | POA: Diagnosis not present

## 2021-12-12 LAB — GLUCOSE, CAPILLARY
Glucose-Capillary: 135 mg/dL — ABNORMAL HIGH (ref 70–99)
Glucose-Capillary: 176 mg/dL — ABNORMAL HIGH (ref 70–99)
Glucose-Capillary: 140 mg/dL — ABNORMAL HIGH (ref 70–99)

## 2021-12-12 LAB — PROTIME-INR
INR: 1.3 — ABNORMAL HIGH (ref 0.8–1.2)
Prothrombin Time: 15.9 seconds — ABNORMAL HIGH (ref 11.4–15.2)

## 2021-12-12 MED ORDER — WARFARIN SODIUM 2.5 MG PO TABS
7.5000 mg | ORAL_TABLET | Freq: Once | ORAL | Status: AC
  Administered 2021-12-12: 7.5 mg via ORAL
  Filled 2021-12-12: qty 3

## 2021-12-12 NOTE — Progress Notes (Signed)
PROGRESS NOTE   Subjective/Complaints:  Pt awake, RN in room. Was a bit more active yesterday, trying to get up on own. Pt indicates things went well yesterday.   ROS: limited due to language/communication   Objective:   No results found. Recent Labs    12/09/21 0743 12/10/21 0318  WBC 6.3 8.4  HGB 8.7* 9.4*  HCT 26.9* 28.9*  PLT 168 179   No results for input(s): "NA", "K", "CL", "CO2", "GLUCOSE", "BUN", "CREATININE", "CALCIUM" in the last 72 hours.  Intake/Output Summary (Last 24 hours) at 12/12/2021 0731 Last data filed at 12/11/2021 2155 Gross per 24 hour  Intake 480 ml  Output --  Net 480 ml        Physical Exam: Vital Signs Blood pressure 125/69, pulse 75, temperature 98.9 F (37.2 C), temperature source Oral, resp. rate 20, height '5\' 6"'$  (1.676 m), weight 95 kg, SpO2 93 %.  Constitutional: No distress . Vital signs reviewed. HEENT: left crani incision, EOMI, oral membranes moist Neck: supple Cardiovascular: RRR without murmur. No JVD    Respiratory/Chest: CTA Bilaterally without wheezes or rales. Normal effort    GI/Abdomen: BS +, non-tender, non-distended Ext: no clubbing, cyanosis, or edema Psych: pleasant and cooperative  Skin: Clean and intact without signs of breakdown Neuro:  Pt is alert,right central 7. Sl left gaze preference. Follows simple commands. Pt better with yes/no questions not always answering yes now. Offering some short 1-2 answers also to questions. Was able to correct herself once or twice. 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 Can move all 4's, L>R, but moved right arm more automatically today!  Senses LT/PP in all 4's. No abnl tone Musculoskeletal: normal PROM, no jt pain    Assessment/Plan: 1. Functional deficits which require 3+ hours per day of interdisciplinary therapy in a comprehensive inpatient rehab setting. Physiatrist is providing close team supervision and 24 hour  management of active medical problems listed below. Physiatrist and rehab team continue to assess barriers to discharge/monitor patient progress toward functional and medical goals  Care Tool:  Bathing    Body parts bathed by patient: Right arm, Left arm, Chest, Abdomen, Front perineal area, Right upper leg, Left upper leg, Face   Body parts bathed by helper: Left lower leg, Right lower leg, Buttocks     Bathing assist Assist Level: Minimal Assistance - Patient > 75%     Upper Body Dressing/Undressing Upper body dressing   What is the patient wearing?: Dress    Upper body assist Assist Level: Moderate Assistance - Patient 50 - 74%    Lower Body Dressing/Undressing Lower body dressing      What is the patient wearing?: Underwear/pull up     Lower body assist Assist for lower body dressing: Minimal Assistance - Patient > 75%     Toileting Toileting    Toileting assist Assist for toileting: Minimal Assistance - Patient > 75%     Transfers Chair/bed transfer  Transfers assist     Chair/bed transfer assist level: Contact Guard/Touching assist     Locomotion Ambulation   Ambulation assist      Assist level: Contact Guard/Touching assist Assistive device: Hand held assist Max distance: 150  Walk 10 feet activity   Assist     Assist level: Contact Guard/Touching assist Assistive device: Hand held assist   Walk 50 feet activity   Assist    Assist level: Contact Guard/Touching assist Assistive device: Hand held assist    Walk 150 feet activity   Assist    Assist level: Contact Guard/Touching assist Assistive device: Hand held assist    Walk 10 feet on uneven surface  activity   Assist     Assist level: Minimal Assistance - Patient > 75% Assistive device: Hand held assist   Wheelchair     Assist Is the patient using a wheelchair?: Yes Type of Wheelchair: Manual    Wheelchair assist level: Supervision/Verbal cueing Max  wheelchair distance: 150    Wheelchair 50 feet with 2 turns activity    Assist        Assist Level: Supervision/Verbal cueing   Wheelchair 150 feet activity     Assist      Assist Level: Supervision/Verbal cueing   Blood pressure 125/69, pulse 75, temperature 98.9 F (37.2 C), temperature source Oral, resp. rate 20, height '5\' 6"'$  (1.676 m), weight 95 kg, SpO2 93 %.  Medical Problem List and Plan: 1. Functional deficits secondary to spontaneous bilateral SDH d/t anticoagulation. Pt is s/p left fronto-temporal craniotomy on 11/24.              -patient may shower             -ELOS/Goals: 10-14 days, supervision to min assist goals with PT, OT, SLP  -Continue CIR therapies including PT, OT, and SLP. Showing some early improvement in language/apraxia. Is a fall risk though  2.  Mechanical mitral valve/Antithrombotics: -DVT/anticoagulation:  Pharmaceutical: Coumadin and Lovenox bridge till INR therapeutic.              -antiplatelet therapy: N/A 3. Pain Management:  Tylenol prn.  4. Mood/Behavior/Sleep: LCSW to follow for evaluation and support.              -antipsychotic agents: N/A 5. Neuropsych/cognition: This patient is not capable of making decisions on her own behalf. 6. Skin/Wound Care: Routine pressure relief measures.  7. Fluids/Electrolytes/Nutrition: Monitor I/O. Will need assistance with meals.              -pt eating with cues, intake picking up --Check CMET on 12/04 8. H/o A fib: Monitor HR TID. Continue amiodarone and coreg BID 9. T2DM: Hgb A1c- 5.8 and well controlled on SSI at this time             --was on metformin PTA.   CBG (last 3)  Recent Labs    12/11/21 1626 12/11/21 2118 12/12/21 0605  GLUCAP 221* 152* 135*    12/2-resumed metformin '250mg'$  daily---observe today 12/3 10 ABLA: Monitor for signs of bleeding. Has had intermittent drop in platelets.  --Recheck CBC on 12/04  11. Seizure prophylaxis: Continues on Keppra 500  mg BID.               --monitor for seizures.  12. HTN: Monitor BP TID--on coreg, Norvasc and Avapro  12/2 controlled 13. Urinary retention:  Has been incontinent of urine. Toilet patient while awake.  --Per records cathed for 800 cc on 11/29. Monitor voiding with PVR checks.    12/3 continent yesterday with 0cc PVR's!  LOS: 2 days A FACE TO FACE EVALUATION WAS PERFORMED  [name] 12/12/2021, 7:31 AM

## 2021-12-12 NOTE — Progress Notes (Signed)
ANTICOAGULATION CONSULT NOTE - Follow Up Consult  Pharmacy Consult for Warfarin Indication:  mechanical mitral valve and atrial fibrillation  Allergies  Allergen Reactions   Chlorhexidine Other (See Comments)    Unknown    Patient Measurements: Height: '5\' 6"'$  (167.6 cm) Weight: 95 kg (209 lb 7 oz) IBW/kg (Calculated) : 59.3   Vital Signs: Temp: 98.9 F (37.2 C) (12/03 0403) Temp Source: Oral (12/03 0403) BP: 126/68 (12/03 0822) Pulse Rate: 72 (12/03 0822)  Labs: Recent Labs    12/10/21 0318 12/11/21 0817 12/12/21 0636  HGB 9.4*  --   --   HCT 28.9*  --   --   PLT 179  --   --   LABPROT 14.3 15.1 15.9*  INR 1.1 1.2 1.3*  HEPARINUNFRC 0.33  --   --      Estimated Creatinine Clearance: 72.1 mL/min (by C-G formula based on SCr of 0.88 mg/dL).  Assessment: 38 YOF with history of mechanical MVR in 2014 and Afib on [place] PTA admitted 11/23 with bleeding gums s/p teeth removal on 11/19/21. CT on admit also showed acute subdural hematoma with midline shift. She received KCentra, Vitamin K '10mg'$  and TXA on 11/23. S/p craniotomy for evacuation on 12/03/21. Pharmacy previously consulted to dose IV heparin > changed to treatment dose Enoxaparin on 12/1.  Cleared to resume Warfarin on 11/30 per Neurosurgery, dosing per Pharmacy.   INR 1.1>1.2> 1.3 after Warfarin 6 mg then 7.5 mg x 2 days.  PTA warfarin regimen: 6 mg daily.  Goal of Therapy:  INR 2.5-3.5 Monitor platelets by anticoagulation protocol: Yes   Plan:  Warfarin 7.5 mg x 1 again today. Daily PT/INR. Enoxaparin 95 mg (1 mg/kg) SQ q12h until INR at goal. Next CBC and chemistries 12/4.  Arty Baumgartner, RPh 12/12/2021,12:24 PM

## 2021-12-13 DIAGNOSIS — D62 Acute posthemorrhagic anemia: Secondary | ICD-10-CM | POA: Diagnosis not present

## 2021-12-13 DIAGNOSIS — R7989 Other specified abnormal findings of blood chemistry: Secondary | ICD-10-CM | POA: Diagnosis not present

## 2021-12-13 DIAGNOSIS — E1169 Type 2 diabetes mellitus with other specified complication: Secondary | ICD-10-CM | POA: Diagnosis not present

## 2021-12-13 DIAGNOSIS — I6200 Nontraumatic subdural hemorrhage, unspecified: Secondary | ICD-10-CM | POA: Diagnosis not present

## 2021-12-13 LAB — CBC WITH DIFFERENTIAL/PLATELET
Abs Immature Granulocytes: 0.06 10*3/uL (ref 0.00–0.07)
Basophils Absolute: 0 10*3/uL (ref 0.0–0.1)
Basophils Relative: 1 %
Eosinophils Absolute: 0.2 10*3/uL (ref 0.0–0.5)
Eosinophils Relative: 3 %
HCT: 31.7 % — ABNORMAL LOW (ref 36.0–46.0)
Hemoglobin: 10.1 g/dL — ABNORMAL LOW (ref 12.0–15.0)
Immature Granulocytes: 1 %
Lymphocytes Relative: 22 %
Lymphs Abs: 1.5 10*3/uL (ref 0.7–4.0)
MCH: 31.8 pg (ref 26.0–34.0)
MCHC: 31.9 g/dL (ref 30.0–36.0)
MCV: 99.7 fL (ref 80.0–100.0)
Monocytes Absolute: 0.5 10*3/uL (ref 0.1–1.0)
Monocytes Relative: 8 %
Neutro Abs: 4.4 10*3/uL (ref 1.7–7.7)
Neutrophils Relative %: 65 %
Platelets: 207 10*3/uL (ref 150–400)
RBC: 3.18 MIL/uL — ABNORMAL LOW (ref 3.87–5.11)
RDW: 13.3 % (ref 11.5–15.5)
WBC: 6.6 10*3/uL (ref 4.0–10.5)
nRBC: 0 % (ref 0.0–0.2)

## 2021-12-13 LAB — URINE CULTURE: Culture: >=100000 — AB

## 2021-12-13 LAB — COMPREHENSIVE METABOLIC PANEL
ALT: 11 U/L (ref 0–44)
AST: 18 U/L (ref 15–41)
Albumin: 3.4 g/dL — ABNORMAL LOW (ref 3.5–5.0)
Alkaline Phosphatase: 81 U/L (ref 38–126)
Anion gap: 7 (ref 5–15)
BUN: 13 mg/dL (ref 8–23)
CO2: 26 mmol/L (ref 22–32)
Calcium: 9.1 mg/dL (ref 8.9–10.3)
Chloride: 108 mmol/L (ref 98–111)
Creatinine, Ser: 1.04 mg/dL — ABNORMAL HIGH (ref 0.44–1.00)
GFR, Estimated: 59 mL/min — ABNORMAL LOW (ref >60)
Glucose, Bld: 190 mg/dL — ABNORMAL HIGH (ref 70–99)
Potassium: 3.9 mmol/L (ref 3.5–5.1)
Sodium: 141 mmol/L (ref 135–145)
Total Bilirubin: 0.6 mg/dL (ref 0.3–1.2)
Total Protein: 7.7 g/dL (ref 6.5–8.1)

## 2021-12-13 LAB — GLUCOSE, CAPILLARY
Glucose-Capillary: 172 mg/dL — ABNORMAL HIGH (ref 70–99)
Glucose-Capillary: 140 mg/dL — ABNORMAL HIGH (ref 70–99)
Glucose-Capillary: 148 mg/dL — ABNORMAL HIGH (ref 70–99)
Glucose-Capillary: 128 mg/dL — ABNORMAL HIGH (ref 70–99)
Glucose-Capillary: 146 mg/dL — ABNORMAL HIGH (ref 70–99)

## 2021-12-13 LAB — PROTIME-INR
INR: 1.5 — ABNORMAL HIGH (ref 0.8–1.2)
Prothrombin Time: 18 seconds — ABNORMAL HIGH (ref 11.4–15.2)

## 2021-12-13 MED ORDER — WARFARIN SODIUM 3 MG PO TABS
9.0000 mg | ORAL_TABLET | Freq: Once | ORAL | Status: AC
  Administered 2021-12-13: 9 mg via ORAL
  Filled 2021-12-13: qty 3

## 2021-12-13 NOTE — Progress Notes (Signed)
Inpatient Rehabilitation  Patient information reviewed and entered into eRehab system by Kenny Stern M. Catheryne Deford, M.A., CCC/SLP, PPS Coordinator.  Information including medical coding, functional ability and quality indicators will be reviewed and updated through discharge.    

## 2021-12-13 NOTE — Progress Notes (Signed)
Physical Therapy Session Note  Patient Details  Name: [name] MRN: [id number] Date of Birth: 05-Dec-1955  Today's Date: 12/13/2021 PT Individual Time: 0845-1000 PT Individual Time Calculation (min): 75 min   Short Term Goals: Week 1:  PT Short Term Goal 1 (Week 1): = LTGs due to ELOS  Skilled Therapeutic Interventions/Progress Updates:  Patient greeted sitting upright in bedside recliner and agreeable to PT treatment session. Patient gait trained from her room to rehab gym without the use of an AD and CGA for safety. Patient then completed the Berg Balance Assessment- Please see below for further details regarding scoring. Patient demonstrates increased fall risk as noted by score of  45/56 on Berg Balance Scale. (<36= high risk for falls, close to 100%; 37-45 significant >80%; 46-51 moderate >50%; 52-55 lower >25%).   Patient gait trained from day room to main rehab gym without AD and CGA/SBA for safety. Patient required extended seated rest break after gait trial secondary to notable SOB. Patient ascended 4 steps without HR and CGA, however required the use of B HR when descending the steps. Unable to decipher how tall her single step is at home secondary to expressive aphasia, however no hand rails present. Patient then ambulated back to day room while stating numbers 1-10 and attempting to go through the alphabet, however as she fatigued unable to perform cognitive dual-tasking.   Patient gait trained x190' without AD and SBA for safety while performing cognitive dual-tasking- Patient tasked with going through the alphabet, however only able to get through "A and B"- Therapist had patient repeat the remainder of the alphabet after therapist with mod cues required for proper letter. Patient required extended seated rest break secondary to fatigue.   Patient completed obstacle course x4 trials consisting of stepping onto/off of airex foam, stepping over two hurdles and weaving in between x5  cones. Patient completed without the use of an AD and SBA/CGA for safety- Patient with one moderate LOB, however was able to appropriately use her stepping strategy. Patient demonstrated minor visual field cut/deficits by knocking over x2 cones on the left.   Patient gait trained back to her room without use of an AD and SBA for safety- Patient left sitting upright in bedside recliner with posey belt on, call bell within reach, family present and all needs met.    Therapy Documentation Precautions:  Precautions Precautions: Fall Restrictions Weight Bearing Restrictions: No  Balance: Balance Balance Assessed: Yes Standardized Balance Assessment Standardized Balance Assessment: Berg Balance Test Berg Balance Test Sit to Stand: Able to stand without using hands and stabilize independently Standing Unsupported: Able to stand safely 2 minutes Sitting with Back Unsupported but Feet Supported on Floor or Stool: Able to sit safely and securely 2 minutes Stand to Sit: Sits safely with minimal use of hands Transfers: Able to transfer safely, minor use of hands Standing Unsupported with Eyes Closed: Able to stand 10 seconds safely Standing Ubsupported with Feet Together: Able to place feet together independently and stand 1 minute safely From Standing, Reach Forward with Outstretched Arm: Can reach forward >12 cm safely (5") From Standing Position, Pick up Object from Floor: Able to pick up shoe, needs supervision From Standing Position, Turn to Look Behind Over each Shoulder: Looks behind one side only/other side shows less weight shift Turn 360 Degrees: Able to turn 360 degrees safely but slowly Standing Unsupported, Alternately Place Feet on Step/Stool: Able to complete >2 steps/needs minimal assist Standing Unsupported, One Foot in Front: Able to  take small step independently and hold 30 seconds Standing on One Leg: Able to lift leg independently and hold 5-10 seconds Total Score:  45  Therapy/Group: Individual Therapy  Sydney  Rafoth 12/13/2021, 7:55 AM

## 2021-12-13 NOTE — Progress Notes (Signed)
Occupational Therapy Session Note  Patient Details  Name: [name] MRN: [id number] Date of Birth: [birth year]  Today's Date: 12/13/2021 OT Individual Time: 1115-1200 OT Individual Time Calculation (min): 45 min    Short Term Goals: Week 1:  OT Short Term Goal 1 (Week 1): Client will visually scan environment to locate 3 items for self-care tasks. OT Short Term Goal 2 (Week 1): Pt will don shirt wwth no more than min assist. OT Short Term Goal 3 (Week 1): Pt will complete toilet transfer with CGA and min cues.  Skilled Therapeutic Interventions/Progress Updates:    Pt received sitting in the recliner with no c/o pain. She declined ADLs despite her daughter encouraging. She completed functional mobility to the gym, about 100 ft, with CGA- (S) level. No LOB. Set up dual tasking activity including dynamic balance challenging with working memory/problem solving task within functional meal planning task. She demonstrated poor error recognition and difficulty with verbal expression, but did demonstrate reading level simple comprehension and chose correct items from recipe card 3/6 categories. The second trial she demonstrated poor ability to discern difference between two recipe cards but with mod verbal cueing she was able to correctly identify new food items. Working memory aspect added with having pt study ingredient card and then walk across the gym to gather items with about 50% accuracy. She struggled with complex comprehension and sequencing overall, requiring mod-max cueing for error recognition. She then worked on Secretary/administrator task with very poor performance, requiring max cueing for copying a near model with color coordinating balls. Extra time required for allowing pt to work through motor planning deficits. She returned to her room. Pt was left sitting up in the recliner with all needs met, chair alarm set, and call bell within reach. Daughter present.    Therapy  Documentation Precautions:  Precautions Precautions: Fall Restrictions Weight Bearing Restrictions: No   Therapy/Group: Individual Therapy  Curtis Sites 12/13/2021, 6:17 AM

## 2021-12-13 NOTE — Progress Notes (Signed)
Speech Language Pathology Daily Session Note  Patient Details  Name: [name] MRN: [id number] Date of Birth: [birth year]  Today's Date: 12/13/2021 SLP Individual Time: 5374-8270 SLP Individual Time Calculation (min): 58 min  Short Term Goals: Week 1: SLP Short Term Goal 1 (Week 1): Pt will demonstrate simple object naming and automatic language with 40% accuracy with max A semantic/phonemic/sentence completion cues. SLP Short Term Goal 2 (Week 1): Pt will response to yes/no questions pertaining to biographical/immediate environment information with 80% accuracy with max A multimodal cues. SLP Short Term Goal 3 (Week 1): Pt will repeat at word level with 40% accuracy given max A multimodal cues. SLP Short Term Goal 4 (Week 1): Pt will write 3-4 letter words with mod A multimodal cues. SLP Short Term Goal 5 (Week 1): Pt will match objects to words in a field of 2-3 with 90% accuract extra time and mod A verbal cues. SLP Short Term Goal 6 (Week 1): Pt will self recognize verbal errors in 40% of opportunties with max A multimodal cues.  Skilled Therapeutic Interventions: Skilled treatment session focused on communication goals. Upon arrival, patient was alert while upright in the recliner. With extra time, patient independently able to verbalize biographical information like her name, names of multiple family members, and her current age. SLP administered the WAB-Bedside.  Patient scored 7/10 in auditory verbal comprehension, 3/10 for verbal repetition, 1/10 for following sequential commands, 3/10 for naming, and 2/10 for writing.The reading subtest was not administered. SLP also facilitated session by providing structured language tasks. Patient named functional items with 50% accuracy, name drawings of functional items with 40% accuracy, and read at the word level with 50% accuracy. Patient was able to achieve 100% accuracy with all tasks when given moderate verbal and sentence completion cues.  Patient demonstrated ability to self-monitor errors in 75% of opportunities with overall Mod A multimodal cues needed to self-correct. Patient left upright in recliner with family present at end of session. Continue with current plan of care.      Pain No/Denies Pain   Therapy/Group: Individual Therapy  Selah Klang 12/13/2021, 1:18 PM

## 2021-12-13 NOTE — IPOC Note (Signed)
Overall Plan of Care [facility]) Patient Details Name: [name] MRN: [id number] DOB: 1955/04/04  Admitting Diagnosis: Subdural hematoma, nontraumatic [facility])  Hospital Problems: Principal Problem:   Subdural hematoma, nontraumatic ([place])     Functional Problem List: Nursing Bowel, Bladder, Safety, Endurance, Medication Management  PT Balance, Motor, Safety, Endurance, Perception  OT Balance, Perception, Behavior, Safety, Cognition, Sensory, Edema, Skin Integrity, Endurance, Vision, Motor, Pain  SLP    TR         Basic ADL's: OT Grooming, Bathing, Dressing, Toileting     Advanced  ADL's: OT       Transfers: PT Bed Mobility, Bed to Chair, Car, Manufacturing systems engineer, Metallurgist: PT Ambulation, Data processing manager, Emergency planning/management officer     Additional Impairments: OT None  SLP Communication, Social Cognition comprehension, expression Awareness  TR      Anticipated Outcomes Item Anticipated Outcome  Self Feeding N/A  Swallowing      Basic self-care  supervision  Toileting  supervision   Bathroom Transfers supervision  Bowel/Bladder  manage bowel w mod I and bladder w toileting  Transfers  supervision basic and car  Locomotion  supervision household x 50' wiht LRAD; supervsion controlled and community settings x 150' wiht LRAD; supervision up/down 12 steps bil rails; min assist to negotiate 2 +1 steps to enter home without rails  Communication  Mod-Min A  Cognition     Pain  n/a  Safety/Judgment  manage w cues   Therapy Plan: PT Intensity: Minimum of 1-2 x/day ,45 to 90 minutes PT Frequency: 5 out of 7 days PT Duration Estimated Length of Stay: 7-9 OT Intensity: Minimum of 1-2 x/day, 45 to 90 minutes OT Frequency: 5 out of 7 days OT Duration/Estimated Length of Stay: 10-14 days SLP Intensity: Minumum of 1-2 x/day, 30 to 90 minutes SLP Frequency: 3 to 5 out of 7 days SLP Duration/Estimated Length of Stay: 10-14   Team Interventions: Nursing  Interventions Bladder Management, Bowel Management, Medication Management, Disease Management/Prevention, Patient/Family Education, Discharge Planning  PT interventions Ambulation/gait training, Training and development officer, Community reintegration, Cognitive remediation/compensation, Discharge planning, DME/adaptive equipment instruction, Functional mobility training, Neuromuscular re-education, Patient/family education, Psychosocial support, Splinting/orthotics, Stair training, Therapeutic Activities, Therapeutic Exercise, UE/LE Strength taining/ROM, UE/LE Coordination activities, Wheelchair propulsion/positioning  OT Interventions Training and development officer, Discharge planning, Functional electrical stimulation, Pain management, Self Care/advanced ADL retraining, Therapeutic Activities, UE/LE Coordination activities, Cognitive remediation/compensation, Disease mangement/prevention, Functional mobility training, Patient/family education, Therapeutic Exercise, Visual/perceptual remediation/compensation, Skin care/wound managment, Community reintegration, Engineer, drilling, Neuromuscular re-education, Psychosocial support, Splinting/orthotics, UE/LE Strength taining/ROM, Wheelchair propulsion/positioning  SLP Interventions Cueing hierarchy, Functional tasks, Multimodal communication approach, Speech/Language facilitation, Patient/family education  TR Interventions    SW/CM Interventions Discharge Planning, Psychosocial Support, Patient/Family Education   Barriers to Discharge MD  Medical stability, Home enviroment access/loayout, and Incontinence  Nursing Home environment access/layout, Decreased caregiver support 1 level 2/1 ste w spouse and son/daughter, spouse takes care of his mom out of the home  PT      OT      SLP      SW Insurance for SNF coverage     Team Discharge Planning: Destination: PT-Home ,OT- Home , SLP-Home Projected Follow-up: PT-Home health PT, OT-  Other  (comment) (TBD), SLP-24 hour supervision/assistance, Outpatient SLP, Skilled Nursing facility Projected Equipment Needs: PT- , OT- To be determined, SLP-  Equipment Details: PT-unknown if pt owns any, OT-  Patient/family involved in discharge planning: PT- Patient,  OT-Patient, Family Midwife, SLP-Patient, Family member/caregiver  MD ELOS: 10-14  Medical Rehab Prognosis:  Excellent Assessment: The patient has been admitted for CIR therapies with the diagnosis of spontaneous bilateral SDH d/t anticoagulation s/p left fronto-temporal craniotomy on 11/24.. The team will be addressing functional mobility, strength, stamina, balance, safety, adaptive techniques and equipment, self-care, bowel and bladder mgt, patient and caregiver education. Goals have been set at supervision to min assist . Anticipated discharge destination is home.        See Team Conference Notes for weekly updates to the plan of care

## 2021-12-13 NOTE — Progress Notes (Signed)
Inpatient Rehabilitation Care Coordinator Assessment and Plan Patient Details  Name: [name] MRN: [id number] Date of Birth: Nov 19, 1955  Today's Date: 12/13/2021  Hospital Problems: Principal Problem:   Subdural hematoma, nontraumatic [facility])  Past Medical History:  Past Medical History:  Diagnosis Date   Abnormal chest CT    Anemia    Aortic atherosclerosis (HCC)    Asthma    Atrial enlargement, left    severe   Atrial fibrillation ([place]) 01/05/2011   Chronic persistent, failed DCCV    Atrial flutter (HCC)    Bronchospasm 1998   Cardiomegaly    Carotid bruit    Chronic diastolic congestive heart failure ([place])    Diabetes mellitus    Type II   Ejection fraction < 50%    35-40%,    Heart murmur    History of blood transfusion    History of radiation therapy 03/18/19-04/15/19   endometrial - vaginal brachytherapy -  Dr. Sondra Come    Hypercholesterolemia    Hypertension    Junctional rhythm 09/14/2018   Noted on EKG   Left bundle branch block (LBBB) 09/14/2018   Noted on EKG   LVH (left ventricular hypertrophy) 10/10/2016   Moderate, Noted on ECHO   Mitral regurgitation    Obesity    Obesity (BMI 30-39.9) 01/16/2012   Persistent atrial fibrillation ([place])    Polyp of rectum    Rheumatic fever 01/16/2012   Reported during childhood   S/P Maze operation for atrial fibrillation 02/15/2012   Complete biatrial lesion set using bipolar radiofrequency and cryothermy ablation with clipping of LA appendage   S/P mitral valve replacement with metallic valve 81/44/8185   27m Sorin [employer]omedics Optiform mechanical prosthesis   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   213mEdwards mc3 ring annuloplasty   Shortness of breath    with exertion   Sleep apnea    DOES NOT HAVE CPAP   [place]   Post op , left arm weakness   Tricuspid regurgitation 01/16/2012   [facility]   Past Surgical History:  Past Surgical History:  Procedure Laterality Date   CARDIAC  CATHETERIZATION  >5 years   CARDIOVASCULAR STRESS TEST  09/2010   CARDIOVERSION  03/25/2011   Procedure: CARDIOVERSION;  Surgeon: JaJettie BoozeMD;  [facility] Service: Cardiovascular;  Laterality: N/A;   CARDIOVERSION N/A 07/27/2012   Procedure: CARDIOVERSION;  Surgeon: JaJettie BoozeMD;  [place] [facility] Service: Cardiovascular;  Laterality: N/A;   CESAREAN SECTION     COLONOSCOPY WITH PROPOFOL N/A 04/27/2016   Procedure: COLONOSCOPY WITH PROPOFOL;  Surgeon: ViWilford CornerMD;  [facility];  Service: Endoscopy;  Laterality: N/A;   CRANIOTOMY Left 12/03/2021   Procedure: FRONTOTEMPORAL CRANIOTOMY FOR EVACUATION SUBDURAL HEMATOMA;  Surgeon: DaKarsten RoDO;  [place]: [facility] Service: Neurosurgery;  Laterality: Left;   INTRAOPERATIVE TRANSESOPHAGEAL ECHOCARDIOGRAM  02/15/2012   Procedure: INTRAOPERATIVE TRANSESOPHAGEAL ECHOCARDIOGRAM;  Surgeon: ClRexene AlbertsMD;  Location: [facility] Service: Open Heart Surgery;  Laterality: N/A;   MAZE  02/15/2012   Procedure: MAZE;  Surgeon: ClRexene AlbertsMD;  [place] [facility] Service: Open Heart Surgery;  Laterality: N/A;   MITRAL VALVE REPLACEMENT  02/15/2012   Procedure: MITRAL VALVE (MV) REPLACEMENT;  Surgeon: ClRexene AlbertsMD;  [place]: [facility] Service: Open Heart Surgery;  Laterality: N/A;   ROBOTIC ASSISTED LAPAROSCOPIC HYSTERECTOMY AND SALPINGECTOMY Bilateral 01/02/2019   Procedure: XI ROBOTIC ASSISTED LAPAROSCOPIC  TOTAL HYSTERECTOMY WITH BILATERAL SALPINGOOPHORECTOMY, SENTINEL LYMPH NODE BIOPSY;  Surgeon: [name], MD;  Location[facility];  Service: Gynecology;  Laterality: Bilateral;   TEE WITHOUT CARDIOVERSION  12/21/2011   Procedure: TRANSESOPHAGEAL ECHOCARDIOGRAM (TEE);  Surgeon: Jettie Booze, MD;  Location: [place];  Service: Cardiovascular;  Laterality: N/A;   TOOTH EXTRACTION N/A 11/19/2021   Procedure: DENTAL EXTRACTION TEETH NUMBER 14, 19, 30, 31;  Surgeon: Diona Browner,  DMD;  Location: [place];  Service: Oral Surgery;  Laterality: N/A;   TRICUSPID VALVE REPLACEMENT  02/15/2012   Procedure: TRICUSPID VALVE REPAIR;  Surgeon: Rexene Alberts, MD;  Location: [place];  Service: Open Heart Surgery;  Laterality: N/A;   TUBAL LIGATION     at time of her c-section   Social History:  reports that she has never smoked. She has never used smokeless tobacco. She reports that she does not drink alcohol and does not use drugs.  Family / Support Systems Marital Status: Married Patient Roles: Spouse, Parent Spouse/Significant Other: Delorise Shiner [phone] Children: Tomeka-daughter [phone]  Wandalee Ferdinand [phone]  Another daughter local also Other Supports: Friends and church members Anticipated Caregiver: Husband and family Ability/Limitations of Caregiver: Daughter can take FMLA if needed. Caregiver Availability: 24/7 Family Dynamics: Close knit family who are very involved and supportive of pt. 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 Will look toward her husband and family for this if needed while here   Abuse/Neglect Abuse/Neglect Assessment Can Be Completed: Yes Physical Abuse: Denies Verbal Abuse: Denies Sexual Abuse: Denies Exploitation of patient/patient's resources: Denies Self-Neglect: Denies  Patient response to: Social Isolation - How often do you feel lonely or isolated from those around you?: Never  Emotional Status Pt's affect,  behavior and adjustment status: Pt is motivated to do well and get back home she feels by Thursday she will be ready. She is not one to rely upon others and is usually the one who is caring for others. Recent Psychosocial Issues: other health issues but was manageing prior to admission-recenlty had teeth removed and was recovering from this Psychiatric History: No history may benefit from seeing neuro-psych once better with her speech which she is improving daily with this Substance Abuse History: No issues  Patient / Family Perceptions, Expectations & Goals Pt/Family understanding of illness & functional limitations: Pt and daughter;s talk with the MD they are aware of her treatment plan moving forward and want to be kept updated on any issues she is having. Daughter's come daily to see her Premorbid pt/family roles/activities: parent, wife, retiree, grandmother, church member, friend, etc Anticipated changes in roles/activities/participation: resume Pt/family expectations/goals: Pt states: " I hope to be home by Thursday."  Daughter's states: " Mom you will need to stay a little longer than that and get stronger."  [employer]: None Premorbid Home Care/DME Agencies: None Transportation available at discharge: self will rely upon family now Is the patient able to respond to transportation needs?: Yes In the past 12 months, has lack  of transportation kept you from medical appointments or from getting medications?: No In the past 12 months, has lack of transportation kept you from meetings, work, or from getting things needed for daily living?: No Resource referrals recommended: Neuropsychology  Discharge Planning Living Arrangements: Spouse/significant other, Children Support Systems: Spouse/significant other, Children, Other relatives, Friends/neighbors, Church/faith community Type of Residence: Private residence Insurance Resources: Multimedia programmer (specify)  ([place] Medicare) Financial Resources: Social Security, Family Support Financial Screen Referred: No Living Expenses: Own Money Management: Patient, Spouse Does the patient have any problems obtaining your medications?: No Home Management: self and family Patient/Family Preliminary Plans: Return home with husband and son whom live together. Her daughter will take FMLA if needed to assist. Pt is making good progress in her therapies. Aware of team conference Wed and will update all of them then. Care Coordinator Barriers to Discharge: Insurance for SNF coverage Care Coordinator Anticipated Follow Up Needs: HH/OP  Clinical Impression Pleasant female who tries to answer question she is very firm when talking about going home she would like to go home on Thursday. Discussed with daughter's present in her room it will probably be longer than Thursday. Will update once team conference on Wednesday. Will ask neuro-psych to see once more appropriate. Pt is making good progress in her therapies.  Elease Hashimoto 12/13/2021, 12:40 PM

## 2021-12-13 NOTE — Progress Notes (Signed)
PROGRESS NOTE   Subjective/Complaints:  Pt seen in room, She is sitting in bedside chair. No new concerns this AM. Family in room.   ROS: limited due to language/communication   Objective:   No results found. Recent Labs    12/13/21 0752  WBC 6.6  HGB 10.1*  HCT 31.7*  PLT 207    Recent Labs    12/13/21 0752  NA 141  K 3.9  CL 108  CO2 26  GLUCOSE 190*  BUN 13  CREATININE 1.04*  CALCIUM 9.1    Intake/Output Summary (Last 24 hours) at 12/13/2021 1127 Last data filed at 12/13/2021 0700 Gross per 24 hour  Intake 238 ml  Output --  Net 238 ml         Physical Exam: Vital Signs Blood pressure 115/64, pulse 73, temperature 98.1 F (36.7 C), temperature source Oral, resp. rate 18, height '5\' 6"'$  (1.676 m), weight 95 kg, SpO2 92 %.  Constitutional: No distress . Vital signs reviewed. Sitting in bedside chair HEENT: left crani incision- no signs of infection appears to be healing well, EOMI, oral membranes moist Neck: supple Cardiovascular: RRR without murmur. No JVD    Respiratory/Chest: CTA Bilaterally without wheezes or rales. Normal effort    GI/Abdomen: BS +, non-tender, non-distended Ext: no clubbing, cyanosis, or edema Psych: pleasant and cooperative  Skin: Clean and intact without signs of breakdown Neuro:  Pt is alert,right central 7. Sl left gaze preference. Follows simple commands. Pt better with yes/no questions not always answering yes now. Offering some short 1-2 answers also to questions. Was able to correct herself once or twice. 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 Can move all 4's, L>R, but moved right arm more automatically today!  Senses LT/PP in all 4's. No abnl tone Musculoskeletal: normal PROM, no jt pain    Assessment/Plan: 1. Functional deficits which require 3+ hours per day of interdisciplinary therapy in a comprehensive inpatient rehab setting. Physiatrist is providing close  team supervision and 24 hour management of active medical problems listed below. Physiatrist and rehab team continue to assess barriers to discharge/monitor patient progress toward functional and medical goals  Care Tool:  Bathing    Body parts bathed by patient: Right arm, Left arm, Chest, Abdomen, Front perineal area, Right upper leg, Left upper leg, Face   Body parts bathed by helper: Left lower leg, Right lower leg, Buttocks     Bathing assist Assist Level: Minimal Assistance - Patient > 75%     Upper Body Dressing/Undressing Upper body dressing   What is the patient wearing?: Dress    Upper body assist Assist Level: Moderate Assistance - Patient 50 - 74%    Lower Body Dressing/Undressing Lower body dressing      What is the patient wearing?: Underwear/pull up     Lower body assist Assist for lower body dressing: Minimal Assistance - Patient > 75%     Toileting Toileting    Toileting assist Assist for toileting: Independent     Transfers Chair/bed transfer  Transfers assist     Chair/bed transfer assist level: Contact Guard/Touching assist     Locomotion Ambulation   Ambulation assist  Assist level: Contact Guard/Touching assist Assistive device: Hand held assist Max distance: 150   Walk 10 feet activity   Assist     Assist level: Contact Guard/Touching assist Assistive device: Hand held assist   Walk 50 feet activity   Assist    Assist level: Contact Guard/Touching assist Assistive device: Hand held assist    Walk 150 feet activity   Assist    Assist level: Contact Guard/Touching assist Assistive device: Hand held assist    Walk 10 feet on uneven surface  activity   Assist     Assist level: Minimal Assistance - Patient > 75% Assistive device: Hand held assist   Wheelchair     Assist Is the patient using a wheelchair?: Yes Type of Wheelchair: Manual    Wheelchair assist level: Supervision/Verbal  cueing Max wheelchair distance: 150    Wheelchair 50 feet with 2 turns activity    Assist        Assist Level: Supervision/Verbal cueing   Wheelchair 150 feet activity     Assist      Assist Level: Supervision/Verbal cueing   Blood pressure 115/64, pulse 73, temperature 98.1 F (36.7 C), temperature source Oral, resp. rate 18, height '5\' 6"'$  (1.676 m), weight 95 kg, SpO2 92 %.  Medical Problem List and Plan: 1. Functional deficits secondary to spontaneous bilateral SDH d/t anticoagulation. Pt is s/p left fronto-temporal craniotomy on 11/24.              -patient may shower             -ELOS/Goals: 10-14 days, supervision to min assist goals with PT, OT, SLP  -Continue CIR therapies including PT, OT, and SLP. Showing some early improvement in language/apraxia. Is a fall risk though  2.  Mechanical mitral valve/Antithrombotics: -DVT/anticoagulation:  Pharmaceutical: Coumadin and Lovenox bridge till INR therapeutic.              -antiplatelet therapy: N/A 3. Pain Management:  Tylenol prn.  4. Mood/Behavior/Sleep: LCSW to follow for evaluation and support.              -antipsychotic agents: N/A 5. Neuropsych/cognition: This patient is not capable of making decisions on her own behalf. 6. Skin/Wound Care: Routine pressure relief measures.  7. Fluids/Electrolytes/Nutrition: Monitor I/O. Will need assistance with meals.              -pt eating with cues, intake picking up --Check CMET on 12/04-  8. H/o A fib: Monitor HR TID. Continue amiodarone and coreg BID 9. T2DM: Hgb A1c- 5.8 and well controlled on SSI at this time             --was on metformin PTA.   CBG (last 3)  Recent Labs    12/12/21 1611 12/12/21 2108 12/13/21 0520  GLUCAP 140* 172* 140*     12/2-resumed metformin '250mg'$  daily---observe today 12/3  Well controlled overall 10 ABLA: Monitor for signs of bleeding. Has had intermittent drop in platelets.  -12/4 HGB increased to 10.1 11. Seizure prophylaxis:  Continues on Keppra 500  mg BID.              --monitor for seizures.  12. HTN: Monitor BP TID--on coreg, Norvasc and Avapro  12/4 well controlled overall   Today's Vitals   12/12/21 1600 12/12/21 1935 12/13/21 0519 12/13/21 0755  BP: (!) 143/63 (!) 115/57 115/64   Pulse: 78 81 73   Resp: '19 18 18   '$ Temp: 98.6 F (37 C)  98.5 F (36.9 C) 98.1 F (36.7 C)   TempSrc:   Oral   SpO2: 94% 93% 92%   Weight:      Height:      PainSc:    0-No pain   Body mass index is 33.8 kg/m.  13. Urinary retention:  Has been incontinent of urine. Toilet patient while awake.  --Per records cathed for 800 cc on 11/29. Monitor voiding with PVR checks.    12/3 continent yesterday with 0cc PVR's! 14. Azotemia  -11/14 Encourage oral fluids  LOS: 3 days A FACE TO FACE EVALUATION WAS PERFORMED  Jennye Boroughs 12/13/2021, 11:27 AM

## 2021-12-13 NOTE — Progress Notes (Signed)
[facility] for Warfarin Indication: mechanical mitral valve and atrial fibrillation   Allergies  Allergen Reactions   Chlorhexidine Other (See Comments)    Unknown   Patient Measurements: Height: '5\' 6"'$  (167.6 cm) Weight: 95 kg (209 lb 7 oz) IBW/kg (Calculated) : 59.3  Vital Signs: Temp: 98.7 F (37.1 C) (12/04 1245) Temp Source: Oral (12/04 0519) BP: 112/63 (12/04 1245) Pulse Rate: 72 (12/04 1245)  Labs: Recent Labs    12/11/21 0817 12/12/21 0636 12/13/21 0752  HGB  --   --  10.1*  HCT  --   --  31.7*  PLT  --   --  207  LABPROT 15.1 15.9* 18.0*  INR 1.2 1.3* 1.5*  CREATININE  --   --  1.04*   Estimated Creatinine Clearance: 61 mL/min (A) (by C-G formula based on SCr of 1.04 mg/dL (H)).  Assessment: 66 YO female with medical history significant for mechanical MVR and Afib on [place] PTA admitted 11/23 with bleeding gums s/p teeth removal on 11/19/21. CT on admit also showed acute subdural hematoma with midline shift. She received KCentra, Vitamin K '10mg'$  and TXA on 11/23. S/p craniotomy for evacuation on 12/03/21.   Cleared to resume Warfarin on 11/30 per Neurosurgery. Pharmacy consulted to manage warfarin. PTA warfarin regimen: 6 mg daily.   INR subtherapeutic 1.3 following D3 of warfarin 7.'5mg'$  PO. Previous CBC stable. No issues with bleeding reported.  Goal of Therapy:  INR 2.5-3.5 Monitor platelets by anticoagulation protocol: Yes   Plan:  Give warfarin 9 mg PO x1 dose Continue enoxaparin '95mg'$  SQ twice daily until INR is therapeutic Check INR daily while on warfarin Continue to monitor H&H and platelets    Thank you for allowing pharmacy to be a part of this patient's care.  Ardyth Harps, PharmD Clinical Pharmacist

## 2021-12-13 NOTE — Progress Notes (Signed)
[facility] Individual Statement of Services  Patient Name:  [name]  Date:  12/13/2021  Welcome to the [facility].  Our goal is to provide you with an individualized program based on your diagnosis and situation, designed to meet your specific needs.  With this comprehensive rehabilitation program, you will be expected to participate in at least 3 hours of rehabilitation therapies Monday-Friday, with modified therapy programming on the weekends.  Your rehabilitation program will include the following services:  Physical Therapy (PT), Occupational Therapy (OT), Speech Therapy (ST), 24 hour per day rehabilitation nursing, Therapeutic Recreaction (TR), Neuropsychology, Care Coordinator, Rehabilitation Medicine, Nutrition Services, and Pharmacy Services  Weekly team conferences will be held on Wednesday to discuss your progress.  Your Inpatient Rehabilitation Care Coordinator will talk with you frequently to get your input and to update you on team discussions.  Team conferences with you and your family in attendance may also be held.  Expected length of stay: 9-13 days  Overall anticipated outcome: supervision with cues  Depending on your progress and recovery, your program may change. Your Inpatient Rehabilitation Care Coordinator will coordinate services and will keep you informed of any changes. Your Inpatient Rehabilitation Care Coordinator's name and contact numbers are listed  below.  The following services may also be recommended but are not provided by the [facility] will be made to provide these services after discharge if needed.  Arrangements include referral to agencies that provide these services.  Your insurance has been verified to be:  [employer] Your primary doctor is:  [name] [name]  Pertinent  information will be shared with your doctor and your insurance company.  Inpatient Rehabilitation Care Coordinator:  Ovidio Kin, [place] or Emilia Beck  Information discussed with and copy given to patient by: Elease Hashimoto, 12/13/2021, 12:43 PM

## 2021-12-14 LAB — PROTIME-INR
INR: 1.7 — ABNORMAL HIGH (ref 0.8–1.2)
Prothrombin Time: 20 seconds — ABNORMAL HIGH (ref 11.4–15.2)

## 2021-12-14 LAB — GLUCOSE, CAPILLARY
Glucose-Capillary: 139 mg/dL — ABNORMAL HIGH (ref 70–99)
Glucose-Capillary: 153 mg/dL — ABNORMAL HIGH (ref 70–99)
Glucose-Capillary: 139 mg/dL — ABNORMAL HIGH (ref 70–99)
Glucose-Capillary: 120 mg/dL — ABNORMAL HIGH (ref 70–99)

## 2021-12-14 MED ORDER — METFORMIN HCL 500 MG PO TABS
250.0000 mg | ORAL_TABLET | Freq: Two times a day (BID) | ORAL | Status: DC
  Administered 2021-12-14 – 2021-12-16 (×4): 250 mg via ORAL
  Filled 2021-12-14 (×4): qty 1

## 2021-12-14 MED ORDER — WARFARIN SODIUM 3 MG PO TABS
9.0000 mg | ORAL_TABLET | Freq: Once | ORAL | Status: AC
  Administered 2021-12-14: 9 mg via ORAL
  Filled 2021-12-14: qty 3

## 2021-12-14 NOTE — Progress Notes (Signed)
Remains alert and oriented to self, situation and place. Slept well without fragmentation. Husband spent the night with patient. VS stable. Denies pain. Tolerated medications. Assistance to bathroom as requested. Safety protocols reinforced with family. Educated on medications and seizure precautions. Safety maintained at all times.

## 2021-12-14 NOTE — Discharge Summary (Signed)
Physician Discharge Summary  Patient ID: [name] MRN: [id number] DOB/AGE: 67-09-1955 67 y.o.  Admit date: 12/10/2021 Discharge date: 12/16/2021  Discharge Diagnoses:  Principal Problem:   Subdural hematoma, nontraumatic ([place]) Active Problems:   Hypertension   Type 2 diabetes mellitus (HCC)   Atrial fibrillation (HCC)   Chronic diastolic CHF (congestive heart failure) (HCC)   Obesity (BMI 30-39.9)   History of mitral valve replacement with mechanical valve   Anemia, chronic disease   Chronic anticoagulation - on coumadin for mechanical mitral valve, afib   Discharged Condition: stable  Significant Diagnostic Studies: N/a   Labs:  Basic Metabolic Panel:    Latest Ref Rng & Units 12/16/2021    6:38 AM 12/13/2021    7:52 AM 12/07/2021   12:54 AM  BMP  Glucose 70 - 99 mg/dL 129  190  156   BUN 8 - 23 mg/dL '17  13  15   '$ Creatinine 0.44 - 1.00 mg/dL 1.14  1.04  0.88   Sodium 135 - 145 mmol/L 140  141  140   Potassium 3.5 - 5.1 mmol/L 4.3  3.9  4.1   Chloride 98 - 111 mmol/L 112  108  104   CO2 22 - 32 mmol/L '23  26  26   '$ Calcium 8.9 - 10.3 mg/dL 8.5  9.1  8.6      CBC:    Latest Ref Rng & Units 12/13/2021    7:52 AM 12/10/2021    3:18 AM 12/09/2021    7:43 AM  CBC  WBC 4.0 - 10.5 K/uL 6.6  8.4  6.3   Hemoglobin 12.0 - 15.0 g/dL 10.1  9.4  8.7   Hematocrit 36.0 - 46.0 % 31.7  28.9  26.9   Platelets 150 - 400 K/uL 207  179  168      CBG: Recent Labs  Lab 12/15/21 1221 12/15/21 1659 12/15/21 2100 12/16/21 0551 12/16/21 1201  GLUCAP 119* 136* 116* 122* 110*    Brief HPI:   [name] is a 66 y.o. female with history of CHF, OSA, CKD, MVR-chronic Coumadin, left MCA stroke 2014, recent dental extraction who was evaluated in the ED 12/02/21 due to issues with ongoing bleeding since her procedure.  She will did report headaches which was followed by somnolence with confusion and CT of head done showing acute on chronic SDH with 16 mm shift to the left, 8 mm left  to right midline shift and effacement of left greater than right psych eye.  INR at admission was 2.4 and was reversed with Kcentra.  Dr. Reatha Armour was consulted and she underwent left frontotemporal craniotomy for evacuation of hematoma on 11/24.  She was started on Keppra for seizure prophylaxis and cleared to start IV heparin on 11/27.  Recommendations were to repeat CT once heparin was therapeutic prior to transition to oral AC.    On 11/29 she had significant worsening of aphasia, problems with ADL and decreased ability to follow commands with more of a left bias with left gaze preference.  EEG was done showing area of epileptogenicity arising from the left frontal region with cortical dysfunction left hemisphere.  Repeat CT head showed mixed density left frontal parietal and temporal lobe which was unchanged and new small right temporal SDH which was unchanged.  She was cleared to resume Coumadin.  She had improvement in motor planning deficits with no overt left neglect or bias per recent therapy notes.  She continued to be intact bilaterally,  motor planning deficits, minimal verbalization with decreasing carryover.  CIR was recommended due to functional decline.   Hospital Course: [name] was admitted to rehab 12/10/2021 for inpatient therapies to consist of PT, ST and OT at least three hours five days a week. Past admission physiatrist, therapy team and rehab RN have worked together to provide customized collaborative inpatient rehab.  Pharmacy has been assisting with dosing and management of Coumadin.  She was bridged with Lovenox with INR of 2.4 at discharge.  Coumadin dosing was discussed with [facility] pharmacist who concurred that Lovenox not needed due to bleeding risk and patient discharged on 6 mg Coumadin to start on 12/8 with INR to be repeated on 12/12 at coumadin clinic.  Patient and family have been educated on importance of restricting vitamin K foods after discharge.  She has been  seizure-free on Keppra 500 mg twice daily and advised to follow-up with neurology for input on continuation of medication.  Her blood pressures were monitored on TID basis and has been reasonably controlled on current regimen. Her blood sugars were monitored on ac/hs with SSI used on prn basis.  Metformin was resumed at 250 mg and titrated to 50 mg twice daily.  Patient and daughter were advised to monitor blood sugars on 3 times daily basis and titrated further to 500 mg twice daily if blood sugars continue to be above 120.  Follow-up CBC shows H&H to be improving.  Check of electrolytes showed rise in serum creatinine to 1.14 and she was advised to increase fluid intake after discharge.  She has made great gains during her rehab stay and is currently at supervision level.  She will continue to receive outpatient PT, OT and ST at [facility] neuro rehab after discharge.   Rehab course: During patient's stay in rehab weekly team conferences was  held to monitor patient's progress, set goals and discuss barriers to discharge. At admission, patient required  min assist with basic ADL tasks and with mobility. 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 She  has had improvement in activity tolerance, balance, postural control as well as ability to compensate for deficits.  She is able to complete ADL tasks with supervision for safety.She is independent for transfers and is able to ambulate 200 feet with supervision and verbal cues for safety.  She is able to come and indicate functioning with min to mod assist and able to comprehend basic to mildly complex information with min assist.  She requires mod verbal cues for speech errors and per family communication is at baseline compared to prior CVA 2014.  Family advised to provide min to mod cognitive assist and family education has been completed with daughter.   Disposition: Home  Diet: Heart Healthy/Carb Modified.   Special Instructions: No  driving for at least 6 months or  till cleared by neurology.  2.  Follow up 12/12 9:15 am for protime check at [facility] coumadin clinic.   Discharge Instructions     Ambulatory referral to Neurology   Complete by: As directed    An appointment is requested in approximately: 4 weeks   Ambulatory referral to Physical Medicine Rehab   Complete by: As directed    Hospital follow up      Allergies as of 12/16/2021       Reactions   Chlorhexidine Other (See Comments)   Unknown        Medication List     STOP taking these medications  alendronate 70 MG tablet Commonly known as: FOSAMAX   enoxaparin 100 MG/ML injection Commonly known as: LOVENOX   HYDROcodone-acetaminophen 5-325 MG tablet Commonly known as: Norco       TAKE these medications    acetaminophen 325 MG tablet Commonly known as: TYLENOL Take 1-2 tablets (325-650 mg total) by mouth every 4 (four) hours as needed for mild pain.   amiodarone 200 MG tablet Commonly known as: PACERONE TAKE 1 TABLET(200 MG) BY MOUTH DAILY   amLODipine 10 MG tablet Commonly known as: NORVASC TAKE 1 TABLET EVERY DAY   atorvastatin 40 MG tablet Commonly known as: LIPITOR TAKE 1 TABLET(40 MG) BY MOUTH DAILY What changed: See the new instructions.   carvedilol 12.5 MG tablet Commonly known as: COREG Take 1 tablet (12.5 mg total) by mouth 2 (two) times daily with a meal.   docusate sodium 100 MG capsule Commonly known as: COLACE Take 1 capsule (100 mg total) by mouth 2 (two) times daily.   irbesartan 300 MG tablet Commonly known as: AVAPRO TAKE 1 TABLET EVERY DAY   levETIRAcetam 500 MG tablet Commonly known as: KEPPRA Take 1 tablet (500 mg total) by mouth 2 (two) times daily.   metFORMIN 500 MG tablet Commonly known as: GLUCOPHAGE Take 0.5 tablets (250 mg total) by mouth 2 (two) times daily with a meal. What changed: how much to take   pantoprazole 40 MG tablet Commonly known as: PROTONIX Take 1 tablet (40 mg  total) by mouth daily.   polyethylene glycol powder 17 GM/SCOOP powder Commonly known as: GLYCOLAX/MIRALAX Take 17 g by mouth daily.   warfarin 4 MG tablet Commonly known as: COUMADIN Take as directed. If you are unsure how to take this medication, talk to your nurse or doctor. Original instructions: TAKE 1 AND 1/2 TABLETS with supper starting Friday. Appointment with coumadin clinic on Monday for further instructions. What changed: additional instructions Notes to patient: START TOMORROW        Follow-up Information     [name], [name], PA Follow up.   Specialty: Physician Assistant Why: Call in 1-2 days for post hospital follow up Contact information: [address] [place] [address] [phone]         Jennye Boroughs, MD Follow up.   Specialty: Physical Medicine and Rehabilitation Why: office will call you with follow up appointment Contact information: [address] [place] [address] [phone]         [facility] Follow up.   Why: office will call you with follow up appointment Contact information: [address]     [address] [place] [address] [phone]        Dawley, Winfield, DO Follow up.   Why: Call in 1-2 days for post hospital follow up Contact information: [place] [address] [phone]                 Signed: Bary Leriche 12/16/2021, 5:18 PM

## 2021-12-14 NOTE — Progress Notes (Signed)
Physical Therapy Session Note  Patient Details  Name: [name] MRN: [id number] Date of Birth: 14-Jan-1955  Today's Date: 12/14/2021 PT Individual Time: 0845-0930 PT Individual Time Calculation (min): 45 min   Short Term Goals: Week 1:  PT Short Term Goal 1 (Week 1): = LTGs due to ELOS  Skilled Therapeutic Interventions/Progress Updates:  Patient greeted sitting upright in bed with RN present and agreeable to PT treatment session. Patient ambulated from room to day room without the use of an AD and SBA- Patient reaches out for the railing in the hallway at times and required verbal cues for not holding onto items as she walks in order to improve overall dynamic stability.   While in the rehab gym patient completed the following activity-  Patient was tasked with picking up a playing card from the tray table, ambulating ~15' to the board where she was to locate it's match and place the playing card up on top of its match, then ambulate back to the tray table and pick up another playing card- Completed for a total of 32 cards. After patient picked up a card she was also tasked with verbalizing the color and number/letter- Patient required assistance 50% of time. Patient was able to accurately locate the card's proper match ~75% of the time with cues for taking her time. Patient required 33.17 minutes to complete activity without a seated rest break. Patient demonstrated good stability throughout activity with no LOB and therapist providing supv.   Patient ambulated back to her room without AD and SBA- Patient left sitting upright in recliner chair with daughter present, call bell within reach, posey belt on and all needs met.    Therapy Documentation Precautions:  Precautions Precautions: Fall Restrictions Weight Bearing Restrictions: No  Therapy/Group: Individual Therapy  Ellsworth Waldschmidt 12/14/2021, 7:52 AM

## 2021-12-14 NOTE — Progress Notes (Signed)
Occupational Therapy Session Note  Patient Details  Name: [name] MRN: [id number] Date of Birth: December 24, 1955  Session 1 Today's Date: 12/14/2021 OT Individual Time: 6967-8938 OT Individual Time Calculation (min): 50 min   Session 2  Today's Date: 12/14/2021 OT Individual Time: 1305-1400 OT Individual Time Calculation (min): 55 min    Short Term Goals: Week 1:  OT Short Term Goal 1 (Week 1): Client will visually scan environment to locate 3 items for self-care tasks. OT Short Term Goal 2 (Week 1): Pt will don shirt wwth no more than min assist. OT Short Term Goal 3 (Week 1): Pt will complete toilet transfer with CGA and min cues.  Skilled Therapeutic Interventions/Progress Updates:     Session 1 Pt received sitting in the recliner with no c/o pain. She stood impulsively with (S) and completed functional mobility into the bathroom with close (S). She completed toileting tasks with (S) overall. She completed 200 ft of functional mobility to the therapy gym with CGA- (S) overall. She required an extended rest break following. Pt used the BITS visual scanning activity to assess for inattention. She completed a 2 min trial and spontaneously used BUE. Slightly slower reaction time in the RUQ but not significant. Overall Slow reaction time (average 2.5 seconds) but accurate in her reaching and perception. 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  No visual field deficits during formal assessment either. She then completed rotator sequence with alphabet. She was able to identify letters in multiple planes demonstrating appropriate visual perception skills. She was unable to continue alphabetical sequence without max cueing. She often required double cue to comprehend each letter but was then able to match the letter without further assist. She ended session with 300 ft of functional mobility with min cueing for needed rest break.  Mobility performed to increase functional activity tolerance and dynamic balance. She returned to her room. Pt was left sitting up in the recliner with all needs met, chair alarm set, and call bell within reach. Daughter present.    Session 2  Pt received sitting in the recliner with no c/o pain. She was agreeable to shower this session. She completed functional mobility around room and appropriately gathered items for shower with (S). She required min cueing to sit down to remove clothing but she was able to do so with (S). She transferred into the walk in shower and was able to stand for the entire shower with (S). Provided education on need to sit to wash distal LE as pt had heavy reliance on the grab bar during this and she does not have a grab bar in her tub at home. She demonstrated appropriate thoroughness and time management. She completed transfer back to the [facility] following to dress. She donned all clothing with (S), no apraxia limiting dressing. She completed oral care at the sink with min cueing to wet toothbrush but (S) overall. Discussed equipment needs with pt and her daughter, she already has a shower chair that OT recommends she uses. She completed 200 ft of functional mobility to the ADL apt with (S). She completed a tub transfer without AD with CGA initially and then progressed to (S) with holding onto the wall only. She then completed a stepping tasks to challenge dynamic standing balance and functional activity tolerance- stepping onto a 5 in step with CGA. 2x8 repetitions with tight turn to simulate home space. She returned to her room and was left sitting up  with all needs met, chair alarm set.    Therapy Documentation Precautions:  Precautions Precautions: Fall Restrictions Weight Bearing Restrictions: No  Therapy/Group: Individual Therapy  Curtis Sites 12/14/2021, 6:21 AM

## 2021-12-14 NOTE — Progress Notes (Signed)
Speech Language Pathology Daily Session Note  Patient Details  Name: [name] MRN: [id number] Date of Birth: 04-Feb-1955  Today's Date: 12/14/2021 SLP Individual Time: 26-1535 SLP Individual Time Calculation (min): 60 min  Short Term Goals: Week 1: SLP Short Term Goal 1 (Week 1): Pt will demonstrate simple object naming and automatic language with 40% accuracy with max A semantic/phonemic/sentence completion cues. SLP Short Term Goal 2 (Week 1): Pt will response to yes/no questions pertaining to biographical/immediate environment information with 80% accuracy with max A multimodal cues. SLP Short Term Goal 3 (Week 1): Pt will repeat at word level with 40% accuracy given max A multimodal cues. SLP Short Term Goal 4 (Week 1): Pt will write 3-4 letter words with mod A multimodal cues. SLP Short Term Goal 5 (Week 1): Pt will match objects to words in a field of 2-3 with 90% accuract extra time and mod A verbal cues. SLP Short Term Goal 6 (Week 1): Pt will self recognize verbal errors in 40% of opportunties with max A multimodal cues.  Skilled Therapeutic Interventions: Skilled ST treatment focused on language goals. Pt ambulated to speech therapy treatment space with sup A verbal cues. SLP facilitated the following language tasks:   Object naming with stimuli from [place] "naming" app. Pt named 36% of items (7/19) without cues, progressing to 84% with mod-to-max A cues (written first letter, phonemic, sentence completion, verbal repetition). Pt known to perseverate on previous stimuli. Pt exhibited several phonemic paraphasias and neologisms.  Yes/no question comprehension pertaining to biographical and immediate environment information with 84% accuracy given min A verbal cues.   Verbal repetition at the word level with 81% accuracy at independent level with single syllables, 80% accuracy with 2 syllable words (ind), and 66% accuracy with 3 syllable words (ind). Pt benefited from  mod-to-max A cues to repair communication breakdowns including separating syllables, verbal repetition with slowed speech, and/or visual modeling from clinician. Pt appeared aware of some verbal errors but not consistently.  Writing pt and family member names with overall 75% spelling accuracy at ind level progressing to 100% with mod A verbal/visual (written) reinforcement to identify and repair error by selecting from field of 2 choices. Pt not aware of written spelling errors.   At end of session, pt requested for a water from SLP. Daughter handed pt a bottle of water to which pt responded "no, I want water" and continued to repeat this. Pt eventually recognized she did not intend to state "water" where it was discovered pt really wanted a soda. Pt required overall mod-to-max A to work through this communication breakdown with support from daughter and SLP given a series of yes/no questions and visual cues.   Daughter inquired about "full supervision" status during meals and was unsure why pt has been receiving supervision with PO intake. SLP reviewed SLP notes and did not locate anything indicating pt would require full supervision. Pt with no swallowing needs per evaluating SLP. Dtr stated pt has been feeding herself independently and not exhibiting any chewing/swallowing difficulty since admission. Informed pt/dtr it would be appropriate to discontinue full supervision status from SLP's perspective, as unable to locate why this was initiated nor clinical reason for the supervision. Suspect this was possibly in error. Notified NT.   Patient was left in recliner with alarm activated and immediate needs within reach at end of session. Continue per current plan of care.      Pain  None/denied  Therapy/Group: Individual Therapy  Tacarra Justo  T Preet Mangano 12/14/2021, 2:41 PM

## 2021-12-14 NOTE — Progress Notes (Signed)
[place] for Warfarin Indication: mechanical mitral valve and atrial fibrillation   Allergies  Allergen Reactions   Chlorhexidine Other (See Comments)    Unknown   Patient Measurements: Height: '5\' 6"'$  (167.6 cm) Weight: 95 kg (209 lb 7 oz) IBW/kg (Calculated) : 59.3  Vital Signs: Temp: 98.8 F (37.1 C) (12/05 0335) BP: 141/74 (12/05 0335) Pulse Rate: 83 (12/05 0335)  Labs: Recent Labs    12/12/21 0636 12/13/21 0752 12/14/21 0557  HGB  --  10.1*  --   HCT  --  31.7*  --   PLT  --  207  --   LABPROT 15.9* 18.0* 20.0*  INR 1.3* 1.5* 1.7*  CREATININE  --  1.04*  --    Estimated Creatinine Clearance: 61 mL/min (A) (by C-G formula based on SCr of 1.04 mg/dL (H)).  Assessment: 66 YO female with medical history significant for mechanical MVR and Afib on [place] PTA admitted 11/23 with bleeding gums s/p teeth removal on 11/19/21. CT on admit also showed acute subdural hematoma with midline shift. She received KCentra, Vitamin K '10mg'$  and TXA on 11/23. S/p craniotomy for evacuation on 12/03/21.   Cleared to resume Warfarin on 11/30 per Neurosurgery. Pharmacy consulted to manage warfarin. PTA warfarin regimen: 6 mg daily.   INR up at 1.7 following warfarin 9 mg PO x1. Previous CBC stable. No issues with bleeding reported.  Goal of Therapy:  INR 2.5-3.5 Monitor platelets by anticoagulation protocol: Yes   Plan:  Give warfarin 9 mg PO x1 dose Continue enoxaparin '95mg'$  SQ twice daily until INR is therapeutic Check INR daily while on warfarin Continue to monitor H&H and platelets    Thank you for allowing pharmacy to be a part of this patient's care.  Ardyth Harps, PharmD Clinical Pharmacist

## 2021-12-14 NOTE — Progress Notes (Signed)
Patient ID: [name], female   DOB: July 03, 1955, 66 y.o.   MRN: [id number] Met with the patient to review current situation, rehab process, team conference and plan of care. Reviewed secondary risks including HF, HTN, prediabetes, A-fib, medications and dietary modifications. Discussed assistance available at discharge; noting spouse takes care of his mother. Constipation addressed; cont w toileting. Continue to follow along to discharge to address educational needs to facilitate preparation for discharge. Margarito Liner

## 2021-12-14 NOTE — Plan of Care (Signed)
  Problem: RH BLADDER ELIMINATION Goal: RH STG MANAGE BLADDER WITH ASSISTANCE Description: STG Manage Bladder With toileting Assistance Outcome: Not Progressing; incontinence at times   Problem: RH SAFETY Goal: RH STG ADHERE TO SAFETY PRECAUTIONS W/ASSISTANCE/DEVICE Description: STG Adhere to Safety Precautions With cues Assistance/Device. Outcome: Not Progressing; impulsive

## 2021-12-14 NOTE — Progress Notes (Signed)
PROGRESS NOTE   Subjective/Complaints:  Working in the gym this AM. No new concerns elicited.   ROS: limited due to language/communication   Objective:   No results found. Recent Labs    12/13/21 0752  WBC 6.6  HGB 10.1*  HCT 31.7*  PLT 207    Recent Labs    12/13/21 0752  NA 141  K 3.9  CL 108  CO2 26  GLUCOSE 190*  BUN 13  CREATININE 1.04*  CALCIUM 9.1     Intake/Output Summary (Last 24 hours) at 12/14/2021 1328 Last data filed at 12/14/2021 0800 Gross per 24 hour  Intake 238 ml  Output --  Net 238 ml         Physical Exam: Vital Signs Blood pressure (!) 141/74, pulse 83, temperature 98.8 F (37.1 C), resp. rate 15, height '5\' 6"'$  (1.676 m), weight 95 kg, SpO2 95 %.  Constitutional: No distress . Vital signs reviewed. Sitting in bedside chair. Working in gym HEENT: left crani incision- no signs of infection appears to be healing well, conjugate gaze, oral membranes moist Neck: supple Cardiovascular: RRR without murmur. No JVD    Respiratory/Chest: CTA Bilaterally without wheezes or rales. Normal effort    GI/Abdomen: BS +, non-tender, non-distended Ext: no clubbing, cyanosis, or edema Psych: pleasant and cooperative  Skin: Clean and intact without signs of breakdown Neuro:  Pt is alert,right central 7. Sl left gaze preference. Follows simple commands. Pt better with yes/no questions not always answering yes now. Offering some short 1-2 answers also to questions. Was able to correct herself once or twice. 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 Can move all 4's, L>R, but moved right arm more automatically today!  Senses LT/PP in all 4's. No abnl tone Musculoskeletal: normal PROM, no jt pain. No joint swelling noted   Assessment/Plan: 1. Functional deficits which require 3+ hours per day of interdisciplinary therapy in a comprehensive inpatient rehab setting. Physiatrist is providing close team  supervision and 24 hour management of active medical problems listed below. Physiatrist and rehab team continue to assess barriers to discharge/monitor patient progress toward functional and medical goals  Care Tool:  Bathing    Body parts bathed by patient: Right arm, Left arm, Chest, Abdomen, Front perineal area, Right upper leg, Left upper leg, Face   Body parts bathed by helper: Left lower leg, Right lower leg, Buttocks     Bathing assist Assist Level: Minimal Assistance - Patient > 75%     Upper Body Dressing/Undressing Upper body dressing   What is the patient wearing?: Dress    Upper body assist Assist Level: Moderate Assistance - Patient 50 - 74%    Lower Body Dressing/Undressing Lower body dressing      What is the patient wearing?: Underwear/pull up     Lower body assist Assist for lower body dressing: Minimal Assistance - Patient > 75%     Toileting Toileting    Toileting assist Assist for toileting: Independent     Transfers Chair/bed transfer  Transfers assist     Chair/bed transfer assist level: Contact Guard/Touching assist     Locomotion Ambulation   Ambulation assist  Assist level: Contact Guard/Touching assist Assistive device: Hand held assist Max distance: 150   Walk 10 feet activity   Assist     Assist level: Contact Guard/Touching assist Assistive device: Hand held assist   Walk 50 feet activity   Assist    Assist level: Contact Guard/Touching assist Assistive device: Hand held assist    Walk 150 feet activity   Assist    Assist level: Contact Guard/Touching assist Assistive device: Hand held assist    Walk 10 feet on uneven surface  activity   Assist     Assist level: Minimal Assistance - Patient > 75% Assistive device: Hand held assist   Wheelchair     Assist Is the patient using a wheelchair?: Yes Type of Wheelchair: Manual    Wheelchair assist level: Supervision/Verbal cueing Max  wheelchair distance: 150    Wheelchair 50 feet with 2 turns activity    Assist        Assist Level: Supervision/Verbal cueing   Wheelchair 150 feet activity     Assist      Assist Level: Supervision/Verbal cueing   Blood pressure (!) 141/74, pulse 83, temperature 98.8 F (37.1 C), resp. rate 15, height '5\' 6"'$  (1.676 m), weight 95 kg, SpO2 95 %.  Medical Problem List and Plan: 1. Functional deficits secondary to spontaneous bilateral SDH d/t anticoagulation. Pt is s/p left fronto-temporal craniotomy on 11/24.              -patient may shower             -ELOS/Goals: 10-14 days, supervision to min assist goals with PT, OT, SLP  -Continue CIR therapies including PT, OT, and SLP. Showing some early improvement in language/apraxia. Is a fall risk though  2.  Mechanical mitral valve/Antithrombotics: -DVT/anticoagulation:  Pharmaceutical: Coumadin and Lovenox bridge till INR therapeutic.              -antiplatelet therapy: N/A 3. Pain Management:  Tylenol prn.  4. Mood/Behavior/Sleep: LCSW to follow for evaluation and support.              -antipsychotic agents: N/A 5. Neuropsych/cognition: This patient is not capable of making decisions on her own behalf. 6. Skin/Wound Care: Routine pressure relief measures.  7. Fluids/Electrolytes/Nutrition: Monitor I/O. Will need assistance with meals.              -pt eating with cues, intake picking up --Check CMET on 12/04-  8. H/o A fib: Monitor HR TID. Continue amiodarone and coreg BID 9. T2DM: Hgb A1c- 5.8 and well controlled on SSI at this time             --was on metformin PTA.   CBG (last 3)  Recent Labs    12/13/21 2105 12/14/21 0536 12/14/21 1142  GLUCAP 146* 139* 153*     12/2-resumed metformin '250mg'$  daily---observe today 12/3  Well controlled overall 10 ABLA: Monitor for signs of bleeding. Has had intermittent drop in platelets.  -12/4 HGB increased to 10.1 11. Seizure prophylaxis: Continues on Keppra 500  mg BID.               --monitor for seizures.  12. HTN: Monitor BP TID--on coreg, Norvasc and Avapro  12/5 well controlled   Today's Vitals   12/13/21 1245 12/13/21 1916 12/13/21 1958 12/14/21 0335  BP: 112/63 (!) 136/44 131/67 (!) 141/74  Pulse: 72 82 81 83  Resp: '16 17 15 15  '$ Temp: 98.7 F (37.1 C)   98.8  F (37.1 C)  TempSrc:  Oral    SpO2: 98% 94% 94% 95%  Weight:      Height:      PainSc:   0-No pain    Body mass index is 33.8 kg/m.  13. Urinary retention:  Has been incontinent of urine. Toilet patient while awake.  --Per records cathed for 800 cc on 11/29. Monitor voiding with PVR checks.    12/3 continent yesterday with Donzetta Kohut PVR's!  12/5 remains continent, monitor 14. Azotemia  -11/14 Encourage oral fluids  LOS: 4 days A FACE TO FACE EVALUATION WAS PERFORMED  Jennye Boroughs 12/14/2021, 1:28 PM

## 2021-12-14 NOTE — Discharge Instructions (Addendum)
Inpatient Rehab Discharge Instructions  [facility] Discharge date and time:  12/16/21  Activities/Precautions/ Functional Status: Activity: no lifting, driving, or strenuous exercise till cleared by MD  Diet: cardiac diet and diabetic diet Need to increase fluid intake.  Wound Care: none needed   Functional status:  ___ No restrictions     ___ Walk up steps independently _X__ 24/7 supervision/assistance   ___ Walk up steps with assistance ___ Intermittent supervision/assistance  ___ Bathe/dress independently ___ Walk with walker     ___ Bathe/dress with assistance ___ Walk Independently    ___ Shower independently _X__ Walk with assistance    ___ Shower with assistance _X__ No alcohol     ___ Return to work/school ________   Special Instructions:   COMMUNITY REFERRALS UPON DISCHARGE:    Outpatient: PT  OT  SP             Agency:[facility] [address] [id number] Phone:[phone]              Appointment Date/Time:WILL CALL TO SET UP FOLLOW UP APPOINTMENTS  Medical Equipment/Items Ordered:NONE NEEDED                                                 Agency/Supplier:NA    Per [facility] statutes, patients with seizures are not allowed to drive until  they have been seizure-free for six months. Use caution when using heavy equipment or power tools. Avoid working on ladders or at heights. Take showers instead of baths. Ensure the water temperature is not too high on the home water heater. Do not go swimming alone. When caring for infants or small children, sit down when holding, feeding, or changing them to minimize risk of injury to the child in the event you have a seizure. Also, Maintain good sleep hygiene. Avoid alcohol.   STROKE/TIA DISCHARGE INSTRUCTIONS SMOKING Cigarette smoking nearly doubles your risk of having a stroke & is the single most alterable risk factor  If you smoke or have smoked in the last 12 months, you are  advised to quit smoking for your health. Most of the excess cardiovascular risk related to smoking disappears within a year of stopping. Ask you doctor about anti-smoking medications [place] Quit Line: 1-800-QUIT NOW Free Smoking Cessation Classes [phone]  CHOLESTEROL Know your levels; limit fat & cholesterol in your diet  Lipid Panel     Component Value Date/Time   CHOL 166 08/10/2021 0838   TRIG 110 08/10/2021 0838   HDL 35 (L) 08/10/2021 0838   CHOLHDL 4.7 (H) 08/10/2021 0838   CHOLHDL 4.3 05/28/2015 0958   VLDL 22 05/28/2015 0958   LDLCALC 111 (H) 08/10/2021 0838     Many patients benefit from treatment even if their cholesterol is at goal. Goal: Total Cholesterol (CHOL) less than 160 Goal:  Triglycerides (TRIG) less than 150 Goal:  HDL greater than 40 Goal:  LDL (LDLCALC) less than 100   BLOOD PRESSURE American Stroke Association blood pressure target is less that 120/80 mm/Hg  Your discharge blood pressure is:  BP: 132/81 Monitor your blood pressure Limit your salt and alcohol intake Many individuals will require more than one medication for high blood pressure  DIABETES (A1c is a blood sugar average for last 3 months) Goal HGBA1c is under 7% (HBGA1c is blood sugar  average for last 3 months)  Diabetes: No known diagnosis of diabetes    Lab Results  Component Value Date   HGBA1C 5.8 (H) 12/02/2021    Your HGBA1c can be lowered with medications, healthy diet, and exercise. Check your blood sugar as directed by your physician Call your physician if you experience unexplained or low blood sugars.  PHYSICAL ACTIVITY/REHABILITATION Goal is 30 minutes at least 4 days per week  Activity: No driving, Therapies: see above Return to work: N/A Activity decreases your risk of heart attack and stroke and makes your heart stronger.  It helps control your weight and blood pressure; helps you relax and can improve your mood. Participate in a regular exercise program. Talk with your  doctor about the best form of exercise for you (dancing, walking, swimming, cycling).  DIET/WEIGHT Goal is to maintain a healthy weight  Your discharge diet is:  Diet Order             Diet Carb Modified Fluid consistency: Thin; Room service appropriate? Yes with Assist  Diet effective now                   liquids Your height is:  Height: '5\' 6"'$  (167.6 cm) Your current weight is: Weight: 95 kg Your Body Mass Index (BMI) is:  BMI (Calculated): 33.82 Following the type of diet specifically designed for you will help prevent another stroke. Your goal weight range is:   Your goal Body Mass Index (BMI) is 19-24. Healthy food habits can help reduce 3 risk factors for stroke:  High cholesterol, hypertension, and excess weight.  RESOURCES Stroke/Support Group:  Call [phone]   STROKE EDUCATION PROVIDED/REVIEWED AND GIVEN TO PATIENT Stroke warning signs and symptoms How to activate emergency medical system (call 911). Medications prescribed at discharge. Need for follow-up after discharge. Personal risk factors for stroke. Pneumonia vaccine given:  Flu vaccine given:  My questions have been answered, the writing is legible, and I understand these instructions.  I will adhere to these goals & educational materials that have been provided to me after my discharge from the hospital.     My questions have been answered and I understand these instructions. I will adhere to these goals and the provided educational materials after my discharge from the hospital.  Patient/Caregiver Signature _______________________________ Date __________  Clinician Signature _______________________________________ Date __________  Please bring this form and your medication list with you to all your follow-up doctor's appointments.

## 2021-12-15 ENCOUNTER — Other Ambulatory Visit (HOSPITAL_COMMUNITY): Payer: Self-pay

## 2021-12-15 DIAGNOSIS — I4891 Unspecified atrial fibrillation: Secondary | ICD-10-CM

## 2021-12-15 LAB — GLUCOSE, CAPILLARY
Glucose-Capillary: 141 mg/dL — ABNORMAL HIGH (ref 70–99)
Glucose-Capillary: 119 mg/dL — ABNORMAL HIGH (ref 70–99)
Glucose-Capillary: 136 mg/dL — ABNORMAL HIGH (ref 70–99)
Glucose-Capillary: 116 mg/dL — ABNORMAL HIGH (ref 70–99)

## 2021-12-15 LAB — PROTIME-INR
INR: 2 — ABNORMAL HIGH (ref 0.8–1.2)
Prothrombin Time: 22.1 seconds — ABNORMAL HIGH (ref 11.4–15.2)

## 2021-12-15 MED ORDER — WARFARIN SODIUM 3 MG PO TABS: 9.0000 mg | ORAL_TABLET | Freq: Once | ORAL | Status: DC

## 2021-12-15 MED ORDER — DOCUSATE SODIUM 50 MG/5ML PO LIQD
100.0000 mg | Freq: Two times a day (BID) | ORAL | 0 refills | Status: DC
  Filled 2021-12-15: qty 100, 5d supply, fill #0

## 2021-12-15 MED ORDER — DOCUSATE SODIUM 100 MG PO CAPS
100.0000 mg | ORAL_CAPSULE | Freq: Two times a day (BID) | ORAL | Status: DC
  Administered 2021-12-15 – 2021-12-16 (×2): 100 mg via ORAL
  Filled 2021-12-15 (×2): qty 1

## 2021-12-15 MED ORDER — PANTOPRAZOLE SODIUM 40 MG PO TBEC
40.0000 mg | DELAYED_RELEASE_TABLET | Freq: Every day | ORAL | 0 refills | Status: DC
  Filled 2021-12-15: qty 30, 30d supply, fill #0

## 2021-12-15 MED ORDER — DOCUSATE SODIUM 100 MG PO CAPS: 100.0000 mg | ORAL_CAPSULE | Freq: Two times a day (BID) | ORAL | Status: DC

## 2021-12-15 MED ORDER — LEVETIRACETAM 500 MG PO TABS
500.0000 mg | ORAL_TABLET | Freq: Two times a day (BID) | ORAL | 0 refills | Status: DC
  Filled 2021-12-15: qty 60, 30d supply, fill #0

## 2021-12-15 MED ORDER — CARVEDILOL 12.5 MG PO TABS
12.5000 mg | ORAL_TABLET | Freq: Two times a day (BID) | ORAL | 0 refills | Status: DC
  Filled 2021-12-15: qty 60, 30d supply, fill #0

## 2021-12-15 MED ORDER — WARFARIN SODIUM 3 MG PO TABS
9.0000 mg | ORAL_TABLET | Freq: Once | ORAL | Status: AC
  Administered 2021-12-15: 9 mg via ORAL
  Filled 2021-12-15: qty 3

## 2021-12-15 MED ORDER — POLYETHYLENE GLYCOL 3350 17 GM/SCOOP PO POWD
17.0000 g | Freq: Every day | ORAL | 0 refills | Status: AC
  Filled 2021-12-15: qty 238, 14d supply, fill #0

## 2021-12-15 MED ORDER — ENOXAPARIN SODIUM 100 MG/ML IJ SOSY
100.0000 mg | PREFILLED_SYRINGE | Freq: Two times a day (BID) | INTRAMUSCULAR | Status: DC
  Administered 2021-12-15 – 2021-12-16 (×2): 100 mg via SUBCUTANEOUS
  Filled 2021-12-15 (×3): qty 1

## 2021-12-15 MED ORDER — ACETAMINOPHEN 325 MG PO TABS: 325.0000 mg | ORAL_TABLET | ORAL | Status: DC | PRN

## 2021-12-15 MED ORDER — AMIODARONE HCL 200 MG PO TABS
ORAL_TABLET | ORAL | 0 refills | Status: AC
  Filled 2021-12-15: qty 30, 30d supply, fill #0

## 2021-12-15 NOTE — Progress Notes (Addendum)
[place] for Warfarin Indication: mechanical mitral valve and atrial fibrillation   Allergies  Allergen Reactions   Chlorhexidine Other (See Comments)    Unknown   Patient Measurements: Height: '5\' 6"'$  (167.6 cm) Weight: 95 kg (209 lb 7 oz) IBW/kg (Calculated) : 59.3  Vital Signs: Temp: 98 F (36.7 C) (12/06 0357) BP: 132/81 (12/06 0357) Pulse Rate: 71 (12/06 0357)  Labs: Recent Labs    12/13/21 0752 12/14/21 0557 12/15/21 0528  HGB 10.1*  --   --   HCT 31.7*  --   --   PLT 207  --   --   LABPROT 18.0* 20.0* 22.1*  INR 1.5* 1.7* 2.0*  CREATININE 1.04*  --   --    Estimated Creatinine Clearance: 61 mL/min (A) (by C-G formula based on SCr of 1.04 mg/dL (H)).  Assessment: 66 YO female with medical history significant for mechanical MVR and Afib on [place] PTA admitted 11/23 with bleeding gums s/p teeth removal on 11/19/21. CT on admit also showed acute subdural hematoma with midline shift. She received KCentra, Vitamin K '10mg'$  and TXA on 11/23. S/p craniotomy for evacuation on 12/03/21.   Cleared to resume Warfarin on 11/30 per Neurosurgery. Pharmacy consulted to manage warfarin. PTA warfarin regimen: 6 mg daily.   INR up at 2.0 following warfarin 9 mg PO x2. 12/04 CBC stable. No issues with bleeding reported.  Goal of Therapy:  INR 2.5-3.5 Monitor platelets by anticoagulation protocol: Yes   Plan:  Give warfarin 9 mg PO x1 dose Continue enoxaparin '100mg'$  SQ twice daily until INR is therapeutic Check INR daily while on warfarin Continue to monitor H&H and platelets  Addendum: updated of possible discharge 12/07. Patient has warfarin '4mg'$  tablets at home; can consider continuing on warfarin '8mg'$  PO daily until follow-up appt with anticoagulation clinic with enoxaparin bridging until INR is therapeutic.     Thank you for allowing pharmacy to be a part of this patient's care.  Ardyth Harps, PharmD Clinical Pharmacist

## 2021-12-15 NOTE — Patient Care Conference (Signed)
Inpatient RehabilitationTeam Conference and Plan of Care Update Date: 12/15/2021   Time: 12:04 PM    Patient Name: [name]      Medical Record Number: [id number]  Date of Birth: 1955-04-20 Sex: Female         Room/Bed: 4W05C/4W05C-01 Payor Info: Payor: [employer] MEDICARE / Plan: [employer] HMO / Product Type: *No Product type* /    Admit Date/Time:  12/10/2021  3:18 PM  Primary Diagnosis:  Subdural hematoma, nontraumatic [facility])  Hospital Problems: Principal Problem:   Subdural hematoma, nontraumatic ([place]) Active Problems:   Hypertension   Atrial fibrillation (HCC)   Obesity (BMI 30-39.9)   Anemia, chronic disease   Chronic anticoagulation - on coumadin for mechanical mitral valve, afib    Expected Discharge Date: Expected Discharge Date: 12/17/21  Team Members Present: Physician leading conference: Dr. Jennye Boroughs Social Worker Present: Ovidio Kin, LCSW Nurse Present: Dorien Chihuahua, RN PT Present: Terence Lux, PT OT Present: Laverle Hobby, OT SLP Present: Weston Anna, SLP PPS Coordinator present : Gunnar Fusi, SLP     Current Status/Progress Goal Weekly Team Focus  Bowel/Bladder   Continent of B/B. LBM 12/14/21   Remain continent   Assist with Toilet needs    Swallow/Nutrition/ Hydration               ADL's   Supervision for ADLs and transfers. Poor complex comprehension that impacts novel tasks   Supervision overall   ADLs, d/c planning, transfers, balance, cognition    Mobility   SBA/Supv with all functional mobility without the use of an AD- Will continue to work on single step mobility and educate family during family training tomorrow   Supv  Endurance/activity tolerance, strengthening, neuro re-ed, cognition, dynamic stability    Communication   mod-to-max A   mod A for verbal expression, min A multimodal communication, min A comprehension of basic information   multimodal communication, object naming, verbal repetition, yes/no  comprehension, written expression    Safety/Cognition/ Behavioral Observations  max A for awareness of verbal errors   mod A   awareness of speech errors for improved repair during communication breakdowns    Pain   Denies pain   Remain pain free   Assess Q4 and prn    Skin   Lt Frontotemporal surgicalincision (Open to air)   Prevent infection and new skin breakdown  Assess QS and prn.      Discharge Planning:  HOme with husband and daughter who is taking FMLA to provide supervision level-family training today in anticipation of DC tomorrow   Team Discussion: Patient doing well; anxious to go home however INR is not yet therapeutic. MD also adjusting DM medication. Staples remain in head incision.  Patient on target to meet rehab goals: yes, currently supervision overall. 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 Continue to work on written expression, multimodal expression and perseveration.   *See Care Plan and progress notes for long and short-term goals.   Revisions to Treatment Plan:  N/A   Teaching Needs: Safety, medications, skin care, transfers, toileting,etc.  Current Barriers to Discharge: None identified  Possible Resolutions to Barriers: Family education OP follow up services     Medical Summary Current Status: SDH, T2DM, HTN, afib, azotemia, HTN  Barriers to Discharge: Medical stability;Renal Insufficiency/Failure  Barriers to Discharge Comments: SDH, T2DM, HTN, afib, azotemia, HTN Possible Resolutions to [employer] Focus: recheck INR- needs to be close to therapeutic, BMP,  adjusted metformin   Continued Need for Acute Rehabilitation Level of Care: The patient requires daily medical management by a physician with specialized training in physical medicine and rehabilitation for the following reasons: Direction of a multidisciplinary physical rehabilitation program to  maximize functional independence : Yes Medical management of patient stability for increased activity during participation in an intensive rehabilitation regime.: Yes Analysis of laboratory values and/or radiology reports with any subsequent need for medication adjustment and/or medical intervention. : Yes   I attest that I was present, lead the team conference, and concur with the assessment and plan of the team.   Dorien Chihuahua B 12/15/2021, 3:58 PM

## 2021-12-15 NOTE — Progress Notes (Signed)
Occupational Therapy Session Note  Patient Details  Name: [name] MRN: [id number] Date of Birth: [birth year]  Today's Date: 12/15/2021 OT Individual Time: 1333-1415 OT Individual Time Calculation (min): 42 min    Short Term Goals: Week 1:  OT Short Term Goal 1 (Week 1): Client will visually scan environment to locate 3 items for self-care tasks. OT Short Term Goal 2 (Week 1): Pt will don shirt wwth no more than min assist. OT Short Term Goal 3 (Week 1): Pt will complete toilet transfer with CGA and min cues.  Skilled Therapeutic Interventions/Progress Updates:    Pt received in the recliner with no c/o pain. She declined shower stating she had already taken one by herself. Unable to confirm/deny this. Pt did have chair alarm on. She completed 200 ft of functional mobility to the tehrapy gym with (S). She required cueing to not hold onto rail in the hallway. She completed BITS activity focused on dual processing comprehension with dynamic balance. Pt able to keep her balance standing on foam wedge while struggling with memory task (very novel task) with 50% accuracy on 2 item sequence. She completed another BITS activity with similar set up but challenging numerical sequence which was much easier for pt. She then completed 3x5 repetitions of blocked practice step ups onto a 8 in to challenge functional activity tolerance and dynamic balance. Toward the end of the final two set she required more like min A 2/2 fatigue. She returned to her room. Pt was left sitting up in the recliner with all needs met, chair alarm set, and call bell within reach.   .  Therapy Documentation Precautions:  Precautions Precautions: Fall Restrictions Weight Bearing Restrictions: No  Therapy/Group: Individual Therapy  Curtis Sites 12/15/2021, 1:46 PM

## 2021-12-15 NOTE — Progress Notes (Addendum)
PROGRESS NOTE   Subjective/Complaints:  Pt sitting in bedside chair with family today. No new concerns.  ROS: limited due to language/communication   Objective:   No results found. Recent Labs    12/13/21 0752  WBC 6.6  HGB 10.1*  HCT 31.7*  PLT 207    Recent Labs    12/13/21 0752  NA 141  K 3.9  CL 108  CO2 26  GLUCOSE 190*  BUN 13  CREATININE 1.04*  CALCIUM 9.1     Intake/Output Summary (Last 24 hours) at 12/15/2021 0826 Last data filed at 12/15/2021 0813 Gross per 24 hour  Intake 360 ml  Output --  Net 360 ml         Physical Exam: Vital Signs Blood pressure 132/81, pulse 71, temperature 98 F (36.7 C), resp. rate 18, height '5\' 6"'$  (1.676 m), weight 95 kg, SpO2 91 %.  Constitutional: No distress . Vital signs reviewed. Sitting in bedside chair. Working in gym HEENT: left crani incision- no signs of infection appears to be healing well, conjugate gaze, oral membranes moist Neck: supple Cardiovascular: RRR without murmur. No JVD    Respiratory/Chest: CTA Bilaterally without wheezes or rales. Normal effort    GI/Abdomen: BS +, non-tender, non-distended Ext: no clubbing, cyanosis, or edema Psych: pleasant and cooperative  Skin: Clean and intact without signs of breakdown Neuro:  Pt is alert,right central 7. Sl left gaze preference. Follows simple commands. Pt better with yes/no questions not always answering yes now. Able to say her name today, says cone, [place] for place.  Offering some short 1-2 answers also to questions. Was able to correct herself once or twice. 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 Can move all 4's, L>R, but moved right arm more automatically today!  Senses LT/PP in all 4's. No abnl tone Musculoskeletal: normal PROM, no jt pain. No joint swelling noted   Assessment/Plan: 1. Functional deficits which require 3+ hours per day of interdisciplinary therapy in a comprehensive  inpatient rehab setting. Physiatrist is providing close team supervision and 24 hour management of active medical problems listed below. Physiatrist and rehab team continue to assess barriers to discharge/monitor patient progress toward functional and medical goals  Care Tool:  Bathing    Body parts bathed by patient: Right arm, Left arm, Chest, Abdomen, Front perineal area, Right upper leg, Left upper leg, Face   Body parts bathed by helper: Left lower leg, Right lower leg, Buttocks     Bathing assist Assist Level: Minimal Assistance - Patient > 75%     Upper Body Dressing/Undressing Upper body dressing   What is the patient wearing?: Dress    Upper body assist Assist Level: Moderate Assistance - Patient 50 - 74%    Lower Body Dressing/Undressing Lower body dressing      What is the patient wearing?: Underwear/pull up     Lower body assist Assist for lower body dressing: Minimal Assistance - Patient > 75%     Toileting Toileting    Toileting assist Assist for toileting: Independent     Transfers Chair/bed transfer  Transfers assist     Chair/bed transfer assist level: Contact Guard/Touching assist  Locomotion Ambulation   Ambulation assist      Assist level: Contact Guard/Touching assist Assistive device: Hand held assist Max distance: 150   Walk 10 feet activity   Assist     Assist level: Contact Guard/Touching assist Assistive device: Hand held assist   Walk 50 feet activity   Assist    Assist level: Contact Guard/Touching assist Assistive device: Hand held assist    Walk 150 feet activity   Assist    Assist level: Contact Guard/Touching assist Assistive device: Hand held assist    Walk 10 feet on uneven surface  activity   Assist     Assist level: Minimal Assistance - Patient > 75% Assistive device: Hand held assist   Wheelchair     Assist Is the patient using a wheelchair?: Yes Type of Wheelchair: Manual     Wheelchair assist level: Supervision/Verbal cueing Max wheelchair distance: 150    Wheelchair 50 feet with 2 turns activity    Assist        Assist Level: Supervision/Verbal cueing   Wheelchair 150 feet activity     Assist      Assist Level: Supervision/Verbal cueing   Blood pressure 132/81, pulse 71, temperature 98 F (36.7 C), resp. rate 18, height '5\' 6"'$  (1.676 m), weight 95 kg, SpO2 91 %.  Medical Problem List and Plan: 1. Functional deficits secondary to spontaneous bilateral SDH d/t anticoagulation. Pt is s/p left fronto-temporal craniotomy on 11/24.              -patient may shower             -ELOS/Goals: 10-14 days, supervision to min assist goals with PT, OT, SLP  -Continue CIR therapies including PT, OT, and SLP. Showing some early improvement in language/apraxia. Is a fall risk though   -DC planned for tomorrow but may need to delay if INR not close to goal 2.  Mechanical mitral valve/Antithrombotics: -DVT/anticoagulation:  Pharmaceutical: Coumadin and Lovenox bridge till INR therapeutic.              -antiplatelet therapy: N/A 3. Pain Management:  Tylenol prn.  4. Mood/Behavior/Sleep: LCSW to follow for evaluation and support.              -antipsychotic agents: N/A 5. Neuropsych/cognition: This patient is not capable of making decisions on her own behalf. 6. Skin/Wound Care: Routine pressure relief measures.  7. Fluids/Electrolytes/Nutrition: Monitor I/O. Will need assistance with meals.              -pt eating with cues, intake picking up --Check CMET on 12/04-  8. H/o A fib: Monitor HR TID. Continue amiodarone and coreg BID  -12/6 INR 2.0, goal 2.5 9. T2DM: Hgb A1c- 5.8 and well controlled on SSI at this time             --was on metformin PTA.   CBG (last 3)  Recent Labs    12/14/21 1648 12/14/21 2054 12/15/21 0604  GLUCAP 139* 120* 141*     12/2-resumed metformin '250mg'$  daily---observe today 12/3  12/5 metformin dose increase to '250mg'$   BID 10 ABLA: Monitor for signs of bleeding. Has had intermittent drop in platelets.  -12/4 HGB increased to 10.1 11. Seizure prophylaxis: Continues on Keppra 500  mg BID.              --monitor for seizures.  12. HTN: Monitor BP TID--on coreg, Norvasc and Avapro  12/6 well controlled overall   Today's Vitals  12/14/21 0335 12/14/21 1624 12/14/21 1952 12/15/21 0357  BP: (!) 141/74 (!) 106/53 125/64 132/81  Pulse: 83 76 80 71  Resp: '15 16 17 18  '$ Temp: 98.8 F (37.1 C) 99.1 F (37.3 C) 98.1 F (36.7 C) 98 F (36.7 C)  TempSrc:      SpO2: 95% 96% 94% 91%  Weight:      Height:      PainSc:   0-No pain    Body mass index is 33.8 kg/m.  13. Urinary retention:  Has been incontinent of urine. Toilet patient while awake.  --Per records cathed for 800 cc on 11/29. Monitor voiding with PVR checks.    12/3 continent yesterday with Donzetta Kohut PVR's!  12/5 remains continent, monitor 14. Azotemia  -Encourage oral fluids  -12/6 Recheck BMP tomorrow  LOS: 5 days A FACE TO FACE EVALUATION WAS PERFORMED  Jennye Boroughs 12/15/2021, 8:26 AM

## 2021-12-15 NOTE — Progress Notes (Signed)
Occupational Therapy Discharge Summary  Patient Details  Name: [name] MRN: [id number] Date of Birth: 1955-08-08  Date of Discharge from OT service:{Time; dates multiple:[id number]}   Patient has met 8 of 8 long term goals due to improved activity tolerance, improved balance, postural control, ability to compensate for deficits, improved attention, improved awareness, and improved coordination.  Patient to discharge at overall Supervision level.  Patient's care partner is independent to provide the necessary cognitive assistance at discharge. [name] has made great progress in a short period of time. Her apraxia has improved significantly during ADLs and she is able to complete her entire routine with (S) overall.    Recommendation:  Patient will benefit from ongoing skilled OT services in outpatient setting to continue to advance functional skills in the area of BADL and iADL.  Equipment: No equipment provided  Reasons for discharge: treatment goals met and discharge from hospital  Patient/family agrees with progress made and goals achieved: Yes  OT Discharge Precautions/Restrictions  Precautions Precautions: Fall Restrictions Weight Bearing Restrictions: No General   Vital Signs   Pain   ADL ADL Eating: Independent Where Assessed-Eating: Chair Grooming: Modified independent Where Assessed-Grooming: Sitting at sink Upper Body Bathing: Modified independent Where Assessed-Upper Body Bathing: Shower Lower Body Bathing: Supervision/safety Where Assessed-Lower Body Bathing: Shower Upper Body Dressing: Supervision/safety Where Assessed-Upper Body Dressing: Edge of bed Lower Body Dressing: Supervision/safety Where Assessed-Lower Body Dressing: Edge of bed Toileting: Supervision/safety Where Assessed-Toileting: Glass blower/designer: Distant supervision Armed forces technical officer Method: Counselling psychologist: Energy manager: Close  supervison Clinical cytogeneticist Method: Magazine features editor: Close supervision Social research officer, government Method: Heritage manager: Gaffer Baseline Vision/History: 1 Wears glasses (reading) Patient Visual Report: No change from baseline Vision Assessment?: No apparent visual deficits Perception  Perception: Within Functional Limits Praxis Praxis: Intact Cognition Cognition Overall Cognitive Status: Impaired/Different from baseline Arousal/Alertness: Awake/alert Orientation Level: Person;Place;Situation Person: Oriented Place: Oriented Situation: Oriented Memory: Appears intact Attention: Sustained Sustained Attention: Appears intact Awareness: Impaired Awareness Impairment: Emergent impairment Problem Solving: Impaired Problem Solving Impairment: Verbal complex;Functional complex Safety/Judgment: Impaired Comments: Still gets up without assist at times Brief Interview for Mental Status (BIMS) Repetition of Three Words (First Attempt): 3 Temporal Orientation: Year: Missed by 1 year Temporal Orientation: Month: Accurate within 5 days Temporal Orientation: Day: Correct Recall: "Sock": No, could not recall Recall: "Blue": Yes, no cue required Recall: "Bed": No, could not recall BIMS Summary Score: 10 Sensation Sensation Light Touch: Appears Intact Hot/Cold: Appears Intact Coordination Gross Motor Movements are Fluid and Coordinated: Yes Fine Motor Movements are Fluid and Coordinated: Yes Motor  Motor Motor: Motor apraxia Mobility  Bed Mobility Bed Mobility: Rolling Right;Rolling Left;Supine to Sit;Sit to Supine Rolling Right: Independent Rolling Left: Independent Left Sidelying to Sit: Independent Supine to Sit: Independent Sit to Supine: Independent Transfers Sit to Stand: Independent Stand to Sit: Independent  Trunk/Postural Assessment  Cervical Assessment Cervical Assessment: Within Functional  Limits Thoracic Assessment Thoracic Assessment: Exceptions to [facility] (rounded shoulders) Lumbar Assessment Lumbar Assessment: Exceptions to [facility] (posterior pelvic tilt) Postural Control Postural Control: Deficits on evaluation Righting Reactions: Delayed, however improving  Balance Balance Balance Assessed: Yes Static Sitting Balance Static Sitting - Balance Support: Feet supported Static Sitting - Level of Assistance: 7: Independent Dynamic Sitting Balance Dynamic Sitting - Balance Support: Feet supported;During functional activity Dynamic Sitting - Level of Assistance: 7: Independent Static Standing Balance Static Standing - Balance Support: During functional activity;No upper extremity supported Static Standing -  Level of Assistance: 7: Independent Dynamic Standing Balance Dynamic Standing - Balance Support: During functional activity;No upper extremity supported Dynamic Standing - Level of Assistance: 6: Modified independent (Device/Increase time) Extremity/Trunk Assessment RUE Assessment RUE Assessment: Within Functional Limits LUE Assessment LUE Assessment: Within Functional Limits   Curtis Sites 12/15/2021, 1:52 PM

## 2021-12-15 NOTE — Progress Notes (Signed)
Had a quite night. Husband stayed overnight. Denies pain. VS stable. Takes medications whole. No new changes to report.

## 2021-12-15 NOTE — Progress Notes (Signed)
Physical Therapy Session Note  Patient Details  Name: [name] MRN: [id number] Date of Birth: 1955-04-18  Today's Date: 12/15/2021 PT Individual Time: 0915-1015 PT Individual Time Calculation (min): 60 min   Short Term Goals: Week 1:  PT Short Term Goal 1 (Week 1): = LTGs due to ELOS  Skilled Therapeutic Interventions/Progress Updates:  Patient greeted sitting upright in recliner in room with spouse and daughter present and agreeable to PT treatment session. Hands-on family training completed in order to ensure a safe discharge home. Patient ambulated to/from her room and ortho gym without AD and distant supv. Patient performed a car transfer with supv, as well as ambulating up/down a low grade ramp and across mulch with no LOB noted. Patient then ascended/descended x12 steps with B HR and supv. Patient then ascended/descended an 8" step without HR and CGA for safety- Performed x1 trial with therapist and x1 with spouse with good stability and safety noted. Patient was able to pick an item up from the floor with Supv and no AD. Patient was able to locate her room from main gym without any VC for directions. Patient the completed the Justice (please see below for scoring details)- Patient scored 52/56. Patient then ambulated >200' without AD and distant supv while performing cognitive dual-tasking. Patient demonstrated increased difficulty verbalizing numbers from 1-10 and performing the alphabet while ambulating. Patient was able to get through A and B, however required prompting and repeating after the therapist for the remaining alphabet. Patient ambulated back to her room and required Mi verbal cues for locating her room- Patient left sitting upright in bedside recliner with call bell within reach, daughter present and all needs met. Therapist gave daughter clearance to ambulate in the room with the patient- Safety plan updated accordingly.    Therapy Documentation Precautions:   Precautions Precautions: Fall Restrictions Weight Bearing Restrictions: No  Balance: Balance Balance Assessed: Yes Standardized Balance Assessment Standardized Balance Assessment: Berg Balance Test Berg Balance Test Sit to Stand: Able to stand without using hands and stabilize independently Standing Unsupported: Able to stand safely 2 minutes Sitting with Back Unsupported but Feet Supported on Floor or Stool: Able to sit safely and securely 2 minutes Stand to Sit: Sits safely with minimal use of hands Transfers: Able to transfer safely, minor use of hands Standing Unsupported with Eyes Closed: Able to stand 10 seconds safely Standing Ubsupported with Feet Together: Able to place feet together independently and stand 1 minute safely From Standing, Reach Forward with Outstretched Arm: Can reach confidently >25 cm (10") From Standing Position, Pick up Object from Floor: Able to pick up shoe safely and easily From Standing Position, Turn to Look Behind Over each Shoulder: Looks behind from both sides and weight shifts well Turn 360 Degrees: Able to turn 360 degrees safely but slowly Standing Unsupported, Alternately Place Feet on Step/Stool: Able to stand independently and complete 8 steps >20 seconds Standing Unsupported, One Foot in Front: Able to plae foot ahead of the other independently and hold 30 seconds Standing on One Leg: Able to lift leg independently and hold > 10 seconds Total Score: 52 Static Sitting Balance Static Sitting - Balance Support: Feet supported Static Sitting - Level of Assistance: 7: Independent Dynamic Sitting Balance Dynamic Sitting - Balance Support: Feet supported;During functional activity Dynamic Sitting - Level of Assistance: 6: Modified independent (Device/Increase time) Static Standing Balance Static Standing - Balance Support: During functional activity;No upper extremity supported Static Standing - Level of Assistance: 6:  Modified independent  (Device/Increase time) Dynamic Standing Balance Dynamic Standing - Balance Support: During functional activity;No upper extremity supported Dynamic Standing - Level of Assistance: 6: Modified independent (Device/Increase time)   Therapy/Group: Individual Therapy  Delsa Walder 12/15/2021, 10:03 AM

## 2021-12-15 NOTE — Progress Notes (Signed)
Speech Language Pathology Discharge Summary  Patient Details  Name: [name] MRN: [id number] Date of Birth: 14-Sep-1955  Date of Discharge from SLP service:December {NUMBERS 1-31 (DATE):31396}, 2023  Today's Date: 12/15/2021 SLP Individual Time: 1110-1205 SLP Individual Time Calculation (min): 55 min  Skilled Therapeutic Interventions: Skilled treatment session focused on education with the patient and daughter. SLP facilitated session by providing education regarding patient's current deficits and their impact on verbal expression, auditory comprehension, and writing skills. SLP reinforced the importance of 24 hour supervision due to these deficits, as well as to ensure safe decision making. SLP educated on communication strategies and demonstrated use during object naming task. Pt achieved 33% accuracy for word finding without cues, progressing to 85% with mod A verbal/visual cues. Pt was able to communicate functional needs with overall mod A verbal cues, and min A verbal cues for comprehension of basic-to-mildly complex auditory information. SLP also educated on activities/functional tasks patient can perform with assistance at home for ongoing improvement of pt's overall language and cognitive-linguistic functioning. Daughter verbalized understanding of education and SLP provided handouts to reinforce information. Pt was left in recliner with alarm activated and immediate needs within reach at end of session.   Patient has met 4 of 4 long term goals.  Patient to discharge at overall Mod;Min level.  Reasons goals not met: none   Clinical Impression/Discharge Summary: Patient has made functional gains and has met 4 of 4 long-term goals this admission due verbal and multimodal communication, basic auditory comprehension, and awareness. Pt is currently communicating functional needs with min-to-mod A and comprehending basic-to-mildly complex auditory information with min A. Pt demonstrates  awareness of speech errors with at least mod A verbal cues. 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 Daughter reported perseveration, semantic paraphasias, and writing deficits are acute. Patient and family education is complete and patient to discharge at overall min-to-mod A level. Patient's care partner is independent to provide the necessary physical and cognitive assistance at discharge. Patient would benefit from continued SLP services in outpatient setting to maximize functional communication and cognitive function and independence.   Care Partner:  Caregiver Able to Provide Assistance: Yes  Type of Caregiver Assistance: Cognitive;Physical  Recommendation:  24 hour supervision/assistance;Outpatient SLP  Rationale for SLP Follow Up: Maximize functional communication;Maximize cognitive function and independence;Reduce caregiver burden  Equipment: none   Reasons for discharge: Discharged from hospital   Patient/Family Agrees with Progress Made and Goals Achieved: Yes    [name] 12/15/2021, 12:51 PM

## 2021-12-15 NOTE — Progress Notes (Addendum)
Patient ID: [name], female   DOB: 27-Sep-1955, 66 y.o.   MRN: [id number]  family here for education in anticipation for discharge tomorrow. Will have team conference today and confirm, pt is doing well and requires 24/7 supervision due to speech and cognition deficits. No equipment needed and have made referral to [facility] on [address] closest OP to pt. Follow up once team conference.  10:00 AM Gave FMLA forms to [place]-daughter aware discharge for tomorrow.   12:30 PM Met with pt to update her and will talk with daughter once returns regarding team conference and discharge tomorrow will be contingent upon her INR. [name].

## 2021-12-15 NOTE — Progress Notes (Signed)
Physical Therapy Discharge Summary  Patient Details  Name: [name] MRN: [id number] Date of Birth: 09-21-1955  Date of Discharge from PT service:December 15, 2021  Patient has met 11 of 11 long term goals due to improved activity tolerance, improved balance, improved postural control, increased strength, improved attention, improved awareness, and improved coordination.  Patient to discharge at an ambulatory level Supervision.   Patient's care partner is independent to provide the necessary cognitive assistance at discharge.  Reasons goals not met: N/A  Recommendation:  Patient will benefit from ongoing skilled PT services in outpatient setting to continue to advance safe functional mobility, address ongoing impairments in dynamic stability, cognition, problem-solving, global strength and minimize fall risk.  Equipment: No equipment provided  Reasons for discharge: treatment goals met and discharge from hospital  Patient/family agrees with progress made and goals achieved: Yes  PT Discharge Precautions/Restrictions Precautions Precautions: Fall Restrictions Weight Bearing Restrictions: No Pain Interference Pain Interference Pain Effect on Sleep: 0. Does not apply - I have not had any pain or hurting in the past 5 days Pain Interference with Therapy Activities: 0. Does not apply - I have not received rehabilitationtherapy in the past 5 days Pain Interference with Day-to-Day Activities: 1. Rarely or not at all Vision/Perception  Vision - History Ability to See in Adequate Light: 0 Adequate Perception Perception: Within Functional Limits Praxis Praxis: Intact  Cognition Overall Cognitive Status: Impaired/Different from baseline Arousal/Alertness: Awake/alert Orientation Level: Oriented to person;Oriented to place;Disoriented to time;Disoriented to situation Year: 2023 Month: December Day of Week: Correct Sustained Attention: Appears intact Memory: Appears  intact Awareness: Impaired Problem Solving: Appears intact Safety/Judgment: Appears intact Sensation Sensation Light Touch: Appears Intact Coordination Gross Motor Movements are Fluid and Coordinated: Yes Fine Motor Movements are Fluid and Coordinated: Yes Finger Nose Finger Test: Mild over/undreshooting on R and L, however more noticable on L UE Motor  Motor Motor: Motor apraxia  Mobility Bed Mobility Bed Mobility: Rolling Right;Rolling Left;Supine to Sit;Sit to Supine Rolling Right: Independent Rolling Left: Independent Left Sidelying to Sit: Independent Supine to Sit: Independent Sit to Supine: Independent Transfers Transfers: Sit to Stand;Stand to Sit;Stand Pivot Transfers Sit to Stand: Independent Stand to Sit: Independent Stand Pivot Transfers: Independent Transfer (Assistive device): None Locomotion  Gait Ambulation: Yes Gait Assistance: Supervision/Verbal cueing Gait Distance (Feet): 200 Feet Assistive device: None Gait Gait: Yes Gait velocity: Decreased Stairs / Additional Locomotion Stairs: Yes Stairs Assistance: Supervision/Verbal cueing Stair Management Technique: Two rails Number of Stairs: 12 Height of Stairs: 6 Ramp: Supervision/Verbal cueing Curb: Supervision/Verbal cueing Wheelchair Mobility Wheelchair Mobility: No (Patient is ambulatory and not using a wheelchair)  Trunk/Postural Assessment  Cervical Assessment Cervical Assessment: Exceptions to [facility] (Forward head) Thoracic Assessment Thoracic Assessment: Exceptions to [facility] (Rounded shoulders) Lumbar Assessment Lumbar Assessment: Exceptions to [facility] (Posterior pelvic tilt) Postural Control Postural Control: Deficits on evaluation Righting Reactions: Delayed, however improving  Balance Balance Balance Assessed: Yes Static Sitting Balance Static Sitting - Balance Support: Feet supported Static Sitting - Level of Assistance: 7: Independent Dynamic Sitting Balance Dynamic Sitting - Balance  Support: Feet supported;During functional activity Dynamic Sitting - Level of Assistance: 6: Modified independent (Device/Increase time) Static Standing Balance Static Standing - Balance Support: During functional activity;No upper extremity supported Static Standing - Level of Assistance: 6: Modified independent (Device/Increase time) Dynamic Standing Balance Dynamic Standing - Balance Support: During functional activity;No upper extremity supported Dynamic Standing - Level of Assistance: 6: Modified independent (Device/Increase time) Extremity Assessment      RLE  Assessment RLE Assessment: Within Functional Limits LLE Assessment LLE Assessment: Within Functional Limits   [name] 12/15/2021, 9:51 AM

## 2021-12-15 NOTE — Progress Notes (Signed)
Occupational Therapy Session Note  Patient Details  Name: [name] MRN: [id number] Date of Birth: January 10, 1956  Today's Date: 12/15/2021 OT Individual Time: 6433-2951 OT Individual Time Calculation (min): 45 min    Short Term Goals: Week 1:  OT Short Term Goal 1 (Week 1): Client will visually scan environment to locate 3 items for self-care tasks. OT Short Term Goal 2 (Week 1): Pt will don shirt wwth no more than min assist. OT Short Term Goal 3 (Week 1): Pt will complete toilet transfer with CGA and min cues.  Skilled Therapeutic Interventions/Progress Updates:    Family education session completed with pt and her spouse [name]. Verbal education provided re fall risk reduction, energy conservation strategies, home carryover of transfer training, ADLs, and IADLs. Demonstration and hands on training completed for pt performance of UB/LB bathing and dressing at (S) level, toileting hygiene and transfers, and shower transfers. She was able to demonstrate tub transfer standing. Completed blocked practice of ascending/descending one 7 in step with HHA to work on dynamic balance and functional activity tolerance. She returned to his room and was left sitting in the recliner with all needs met. Husband present.    Therapy Documentation Precautions:  Precautions Precautions: Fall Restrictions Weight Bearing Restrictions: No  Therapy/Group: Individual Therapy  Curtis Sites 12/15/2021, 6:47 AM

## 2021-12-16 ENCOUNTER — Other Ambulatory Visit (HOSPITAL_COMMUNITY): Payer: Self-pay

## 2021-12-16 DIAGNOSIS — Z7901 Long term (current) use of anticoagulants: Secondary | ICD-10-CM

## 2021-12-16 LAB — BASIC METABOLIC PANEL
Anion gap: 5 (ref 5–15)
BUN: 17 mg/dL (ref 8–23)
CO2: 23 mmol/L (ref 22–32)
Calcium: 8.5 mg/dL — ABNORMAL LOW (ref 8.9–10.3)
Chloride: 112 mmol/L — ABNORMAL HIGH (ref 98–111)
Creatinine, Ser: 1.14 mg/dL — ABNORMAL HIGH (ref 0.44–1.00)
GFR, Estimated: 53 mL/min — ABNORMAL LOW (ref >60)
Glucose, Bld: 129 mg/dL — ABNORMAL HIGH (ref 70–99)
Potassium: 4.3 mmol/L (ref 3.5–5.1)
Sodium: 140 mmol/L (ref 135–145)

## 2021-12-16 LAB — PROTIME-INR
INR: 2.4 — ABNORMAL HIGH (ref 0.8–1.2)
Prothrombin Time: 26.1 seconds — ABNORMAL HIGH (ref 11.4–15.2)

## 2021-12-16 LAB — GLUCOSE, CAPILLARY
Glucose-Capillary: 122 mg/dL — ABNORMAL HIGH (ref 70–99)
Glucose-Capillary: 110 mg/dL — ABNORMAL HIGH (ref 70–99)

## 2021-12-16 MED ORDER — DOCUSATE SODIUM 100 MG PO CAPS
100.0000 mg | ORAL_CAPSULE | Freq: Two times a day (BID) | ORAL | 0 refills | Status: AC
  Filled 2021-12-16: qty 60, 30d supply, fill #0

## 2021-12-16 MED ORDER — WARFARIN SODIUM 4 MG PO TABS: ORAL_TABLET | ORAL | 1 refills | Status: AC

## 2021-12-16 MED ORDER — WARFARIN SODIUM 4 MG PO TABS: 4.0000 mg | ORAL_TABLET | Freq: Every day | ORAL | 1 refills | Status: DC

## 2021-12-16 MED ORDER — METFORMIN HCL 500 MG PO TABS
500.0000 mg | ORAL_TABLET | Freq: Two times a day (BID) | ORAL | 0 refills | Status: DC
  Filled 2021-12-16: qty 60, 30d supply, fill #0

## 2021-12-16 MED ORDER — WARFARIN SODIUM 3 MG PO TABS
8.0000 mg | ORAL_TABLET | Freq: Once | ORAL | Status: AC
  Administered 2021-12-16: 8 mg via ORAL
  Filled 2021-12-16: qty 2

## 2021-12-16 MED ORDER — METFORMIN HCL 500 MG PO TABS: 250.0000 mg | ORAL_TABLET | Freq: Two times a day (BID) | ORAL | 0 refills | Status: AC

## 2021-12-16 NOTE — Progress Notes (Signed)
Speech Language Pathology Daily Session Note  Patient Details  Name: [name] MRN: [id number] Date of Birth: August 11, 1955  Today's Date: 12/16/2021 SLP Individual Time: 8638-1771 SLP Individual Time Calculation (min): 45 min  Short Term Goals: Week 1: SLP Short Term Goal 1 (Week 1): Pt will demonstrate simple object naming and automatic language with 40% accuracy with max A semantic/phonemic/sentence completion cues. SLP Short Term Goal 2 (Week 1): Pt will response to yes/no questions pertaining to biographical/immediate environment information with 80% accuracy with max A multimodal cues. SLP Short Term Goal 3 (Week 1): Pt will repeat at word level with 40% accuracy given max A multimodal cues. SLP Short Term Goal 4 (Week 1): Pt will write 3-4 letter words with mod A multimodal cues. SLP Short Term Goal 5 (Week 1): Pt will match objects to words in a field of 2-3 with 90% accuract extra time and mod A verbal cues. SLP Short Term Goal 6 (Week 1): Pt will self recognize verbal errors in 40% of opportunties with max A multimodal cues.  Skilled Therapeutic Interventions: Skilled ST treatment focused on language goals and additional education with daughter. MD present during session and explained currently waiting to d/c 2/2 INR. Pt/family to wait for further instruction from team. Pt/daughter in agreement. SLP facilitated word finding and written expression at the word level with 40% accuracy at independent level progressing to 70% accuracy for verbal repetition with mod-to-max A, and 100% spelling accuracy with max A verbal and/or written cues. Pt was known to perseverate on previous stimuli with limited awareness when spoken verbally, however she had increased awareness when perseveration occurred with written stimuli. Pt was able to self correct written errors during 2 occasions independent of cues. Pt wrote family member names with 75% spelling accuracy. SLP reinforced strategies for verbal and  written expression as well as discussed activities pt/family could do at home to increase verbal expression and further address word finding and written expression. Daughter verbalized understanding through teach back and demonstration use of communication strategies with pt throughout encounter. Patient was left in bed with alarm activated and immediate needs within reach at end of session. Continue per current plan of care.      Pain    Therapy/Group: Individual Therapy  Patty Sermons 12/16/2021, 9:31 AM

## 2021-12-16 NOTE — Plan of Care (Signed)
  Problem: RH Comprehension Communication Goal: LTG Patient will comprehend basic/complex auditory (SLP) Description: LTG: Patient will comprehend basic/complex auditory information with cues (SLP). Outcome: Completed/Met   Problem: RH Expression Communication Goal: LTG Patient will express needs/wants via multi-modal(SLP) Description: LTG:  Patient will express needs/wants via multi-modal communication (gestures/written, etc) with cues (SLP) Outcome: Completed/Met Goal: LTG Patient will verbally express basic/complex needs(SLP) Description: LTG:  Patient will verbally express basic/complex needs, wants or ideas with cues  (SLP) Outcome: Completed/Met   Problem: RH Awareness Goal: LTG: Patient will demonstrate awareness during functional activites type of (SLP) Description: LTG: Patient will demonstrate awareness during functional activites type of (SLP) Outcome: Completed/Met

## 2021-12-16 NOTE — Progress Notes (Signed)
Speech Language Pathology Daily Session Note  Patient Details  Name: [name] MRN: [id number] Date of Birth: [birth year]  Today's Date: 12/15/2021 SLP Individual Time: 25 - 46  SLP Individual Time Calculation (min): 55 min   Short Term Goals: Week 1: SLP Short Term Goal 1 (Week 1): Pt will demonstrate simple object naming and automatic language with 40% accuracy with max A semantic/phonemic/sentence completion cues. SLP Short Term Goal 2 (Week 1): Pt will response to yes/no questions pertaining to biographical/immediate environment information with 80% accuracy with max A multimodal cues. SLP Short Term Goal 3 (Week 1): Pt will repeat at word level with 40% accuracy given max A multimodal cues. SLP Short Term Goal 4 (Week 1): Pt will write 3-4 letter words with mod A multimodal cues. SLP Short Term Goal 5 (Week 1): Pt will match objects to words in a field of 2-3 with 90% accuract extra time and mod A verbal cues. SLP Short Term Goal 6 (Week 1): Pt will self recognize verbal errors in 40% of opportunties with max A multimodal cues.  Skilled Therapeutic Interventions: Skilled treatment session focused on education with the patient and daughter. SLP facilitated session by providing education regarding patient's current deficits and their impact on verbal expression, auditory comprehension, and writing skills. SLP reinforced the importance of 24 hour supervision due to these deficits, as well as to ensure safe decision making. SLP educated on communication strategies and demonstrated use during object naming task. Pt achieved 33% accuracy for word finding without cues, progressing to 85% with mod A verbal/visual cues. Pt was able to communicate functional needs with overall mod A verbal cues, and min A verbal cues for comprehension of basic-to-mildly complex auditory information. SLP also educated on activities/functional tasks patient can perform with assistance at home for ongoing improvement  of pt's overall language and cognitive-linguistic functioning. Daughter verbalized understanding of education and SLP provided handouts to reinforce information. Pt was left in recliner with alarm activated and immediate needs within reach at end of session.     Therapy/Group: Individual Therapy  Verdia Bolt T Artina Minella 12/16/2021, 8:02 AM

## 2021-12-16 NOTE — Progress Notes (Signed)
Inpatient Rehabilitation Discharge Medication Review by a Pharmacist  A complete drug regimen review was completed for this patient to identify any potential clinically significant medication issues.  High Risk Drug Classes Is patient taking? Indication by Medication  Antipsychotic No   Anticoagulant Yes Warfarin- mMVR, afib   Antibiotic No   Opioid No   Antiplatelet No   Hypoglycemics/insulin Yes Metformin - DM  Vasoactive Medication Yes Amiodarone - afib Amlodipine, carvedilol, irbesartan - HTN  Chemotherapy No   Other Yes Alendronate - osteoporosis  Atorvastatin - HLD Docusate, Miralax - constipation Levetiracetam - seizure ppx Pantoprazole - GERD ppx     Type of Medication Issue Identified Description of Issue Recommendation(s)  Drug Interaction(s) (clinically significant)     Duplicate Therapy     Allergy     No Medication Administration End Date     Incorrect Dose     Additional Drug Therapy Needed     Significant med changes from prior encounter (inform family/care partners about these prior to discharge). Norco discontinued Communicate medication changes with patient/family at discharge  Other       Clinically significant medication issues were identified that warrant physician communication and completion of prescribed/recommended actions by midnight of the next day:  Yes clarified and adjusted  Name of provider notified for urgent issues identified: PA Pam Love   Provider Method of Notification: secure chat    Pharmacist comments:  Continue alendronate for osteo Resume PTA warfarin regimen beginning 12/08  Time spent performing this drug regimen review (minutes): 20   Thank you for allowing pharmacy to be a part of this patient's care.  Ardyth Harps, PharmD Clinical Pharmacist

## 2021-12-16 NOTE — Progress Notes (Signed)
Slept well without fragmentation. Occasional visit to bathroom. Denies pain. Mentation improving. Conversations appropriate. Safety maintained.

## 2021-12-16 NOTE — Progress Notes (Signed)
PROGRESS NOTE   Subjective/Complaints:  No new concerns this AM.   ROS: limited due to language/communication   Objective:   No results found. No results for input(s): "WBC", "HGB", "HCT", "PLT" in the last 72 hours.  Recent Labs    12/16/21 0638  NA 140  K 4.3  CL 112*  CO2 23  GLUCOSE 129*  BUN 17  CREATININE 1.14*  CALCIUM 8.5*     Intake/Output Summary (Last 24 hours) at 12/16/2021 1126 Last data filed at 12/16/2021 0828 Gross per 24 hour  Intake 480 ml  Output --  Net 480 ml         Physical Exam: Vital Signs Blood pressure (!) 117/59, pulse 82, temperature 98 F (36.7 C), resp. rate 18, height '5\' 6"'$  (1.676 m), weight 95 kg, SpO2 99 %.  Constitutional: No distress . Vital signs reviewed. Sitting in bedside chair. Working in gym HEENT: left crani incision- no signs of infection appears to be healing well, conjugate gaze, oral membranes moist Neck: supple Cardiovascular: RRR without murmur. No JVD    Respiratory/Chest: CTA Bilaterally without wheezes or rales. No increased WOB GI/Abdomen: BS +, non-tender, non-distended Ext: no clubbing, cyanosis, or edema Psych: pleasant and cooperative  Skin: warm and dry Neuro:  Pt is alert,right central 7. Sl left gaze preference. Follows simple commands. Able to say her name today, says [place], [place] for place.  Offering some short 1-2 answers also to questions. Was able to correct herself once or twice. 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 Can move all 4's, L>R, Senses LT/PP in all 4's. No abnl tone Musculoskeletal: normal PROM, no jt pain. No joint swelling noted   Assessment/Plan: 1. Functional deficits which require 3+ hours per day of interdisciplinary therapy in a comprehensive inpatient rehab setting. Physiatrist is providing close team supervision and 24 hour management of active medical problems listed below. Physiatrist and rehab team continue to  assess barriers to discharge/monitor patient progress toward functional and medical goals  Care Tool:  Bathing    Body parts bathed by patient: Right arm, Left arm, Chest, Abdomen, Front perineal area, Right upper leg, Left upper leg, Face, Buttocks, Right lower leg, Left lower leg   Body parts bathed by helper: Left lower leg, Right lower leg, Buttocks     Bathing assist Assist Level: Supervision/Verbal cueing     Upper Body Dressing/Undressing Upper body dressing   What is the patient wearing?: Pull over shirt, Bra    Upper body assist Assist Level: Supervision/Verbal cueing    Lower Body Dressing/Undressing Lower body dressing      What is the patient wearing?: Underwear/pull up, Pants     Lower body assist Assist for lower body dressing: Supervision/Verbal cueing     Toileting Toileting    Toileting assist Assist for toileting: Supervision/Verbal cueing     Transfers Chair/bed transfer  Transfers assist     Chair/bed transfer assist level: Supervision/Verbal cueing     Locomotion Ambulation   Ambulation assist      Assist level: Contact Guard/Touching assist Assistive device: Hand held assist Max distance: 150   Walk 10 feet activity   Assist     Assist  level: Contact Guard/Touching assist Assistive device: Hand held assist   Walk 50 feet activity   Assist    Assist level: Contact Guard/Touching assist Assistive device: Hand held assist    Walk 150 feet activity   Assist    Assist level: Contact Guard/Touching assist Assistive device: Hand held assist    Walk 10 feet on uneven surface  activity   Assist     Assist level: Minimal Assistance - Patient > 75% Assistive device: Hand held assist   Wheelchair     Assist Is the patient using a wheelchair?: Yes Type of Wheelchair: Manual    Wheelchair assist level: Supervision/Verbal cueing Max wheelchair distance: 150    Wheelchair 50 feet with 2 turns  activity    Assist        Assist Level: Supervision/Verbal cueing   Wheelchair 150 feet activity     Assist      Assist Level: Supervision/Verbal cueing   Blood pressure (!) 117/59, pulse 82, temperature 98 F (36.7 C), resp. rate 18, height '5\' 6"'$  (1.676 m), weight 95 kg, SpO2 99 %.  Medical Problem List and Plan: 1. Functional deficits secondary to spontaneous bilateral SDH d/t anticoagulation. Pt is s/p left fronto-temporal craniotomy on 11/24.              -patient may shower             -ELOS/Goals: 10-14 days, supervision to min assist goals with PT, OT, SLP  -Continue CIR therapies including PT, OT, and SLP. Showing some early improvement in language/apraxia. Is a fall risk though   -INR near goal of 2.4, will check with her pharmacist at cardiology office regarding level, would like to not send home on lovenox  2.  Mechanical mitral valve/Antithrombotics: -DVT/anticoagulation:  Pharmaceutical: Coumadin and Lovenox bridge till INR therapeutic.              -antiplatelet therapy: N/A 3. Pain Management:  Tylenol prn.  4. Mood/Behavior/Sleep: LCSW to follow for evaluation and support.              -antipsychotic agents: N/A 5. Neuropsych/cognition: This patient is not capable of making decisions on her own behalf. 6. Skin/Wound Care: Routine pressure relief measures.  7. Fluids/Electrolytes/Nutrition: Monitor I/O. Will need assistance with meals.              -pt eating with cues, intake picking up --Check CMET on 12/04-  8. H/o A fib: Monitor HR TID. Continue amiodarone and coreg BID  -12/6 INR 2.0, goal 2.5  12/7 INR 2.4 will discuss with pharmacy 9. T2DM: Hgb A1c- 5.8 and well controlled on SSI at this time             --was on metformin PTA.   CBG (last 3)  Recent Labs    12/15/21 1659 12/15/21 2100 12/16/21 0551  GLUCAP 136* 116* 122*    12/2-resumed metformin '250mg'$  daily---observe today 12/3  12/5 metformin dose increase to '250mg'$  BID  12/7 CBG well  controlled, continue current 10 ABLA: Monitor for signs of bleeding. Has had intermittent drop in platelets.  -12/4 HGB increased to 10.1 11. Seizure prophylaxis: Continues on Keppra 500  mg BID.              --monitor for seizures.  12. HTN: Monitor BP TID--on coreg, Norvasc and Avapro  12/7 well controlled   Today's Vitals   12/16/21 0325 12/16/21 0807 12/16/21 0808 12/16/21 1038  BP: 134/81 (!) 117/59 (!) 117/59  Pulse: 70 82 82   Resp: 18     Temp: 98 F (36.7 C)     TempSrc:      SpO2: 93% 99%    Weight:      Height:      PainSc:    0-No pain   Body mass index is 33.8 kg/m.  13. Urinary retention:  Has been incontinent of urine. Toilet patient while awake.  --Per records cathed for 800 cc on 11/29. Monitor voiding with PVR checks.    12/3 continent yesterday with Donzetta Kohut PVR's!  12/5 remains continent, monitor 14. Azotemia  -Encourage oral fluids  -12/7 Cr up to 1.14, discussed drinking more oral fluids with patient and family  LOS: 6 days A FACE TO FACE EVALUATION WAS PERFORMED  Jennye Boroughs 12/16/2021, 11:26 AM

## 2021-12-16 NOTE — Progress Notes (Signed)
[place] for Warfarin Indication: mechanical mitral valve and atrial fibrillation   Allergies  Allergen Reactions   Chlorhexidine Other (See Comments)    Unknown   Patient Measurements: Height: '5\' 6"'$  (167.6 cm) Weight: 95 kg (209 lb 7 oz) IBW/kg (Calculated) : 59.3  Vital Signs: Temp: 98 F (36.7 C) (12/07 0325) BP: 117/59 (12/07 0808) Pulse Rate: 82 (12/07 0808)  Labs: Recent Labs    12/14/21 0557 12/15/21 0528 12/16/21 0638  LABPROT 20.0* 22.1* 26.1*  INR 1.7* 2.0* 2.4*  CREATININE  --   --  1.14*   Estimated Creatinine Clearance: 55.6 mL/min (A) (by C-G formula based on SCr of 1.14 mg/dL (H)).  Assessment: 66 YO female with medical history significant for mechanical MVR and Afib on [place] PTA admitted 11/23 with bleeding gums s/p teeth removal on 11/19/21. CT on admit also showed acute subdural hematoma with midline shift. She received KCentra, Vitamin K '10mg'$  and TXA on 11/23. S/p craniotomy for evacuation on 12/03/21.   Cleared to resume Warfarin on 11/30 per Neurosurgery. Pharmacy consulted to manage warfarin. PTA warfarin regimen: 6 mg daily.   INR up at 2.4 following warfarin 9 mg PO x3. 12/04 CBC stable. No issues with bleeding reported.  Goal of Therapy:  INR 2.5-3.5 Monitor platelets by anticoagulation protocol: Yes   Plan:  Give warfarin 8 mg PO x1 dose (adjusting for home supply) Continue enoxaparin '100mg'$  SQ twice daily until INR is therapeutic Check INR daily while on warfarin Continue to monitor H&H and platelets  Updated of possible discharge 12/07. Patient has warfarin '4mg'$  tablets at home, and per previous [facility] visits, patient had remained therapeutic with PTA regimen. Can consider resuming PTA regimen of warfarin '6mg'$  PO daily beginning 12/08 until follow-up appt with anticoagulation clinic with enoxaparin bridging until INR is therapeutic.     Thank you for allowing pharmacy to be a part of this patient's  care.  Ardyth Harps, PharmD Clinical Pharmacist

## 2021-12-16 NOTE — Progress Notes (Addendum)
Patient ID: [name], female   DOB: [birth year], 66 y.o.   MRN: [id number]  Awaiting medical clearance for discharge today due to INR. MD and Pam-PA will confirm when know. Pt and family are aware of this.  2:31 PM According to Pam-PA pt will discharge home today. Pt very happy about this

## 2021-12-16 NOTE — Progress Notes (Signed)
Inpatient Rehabilitation Care Coordinator Discharge Note   Patient Details  Name: [name] MRN: [id number] Date of Birth: 1955-09-16   Discharge location: [place] TO TAKE FMLA TO PROVIDE 24/7 SUPERVISION  Length of Stay: 6 DAYS  Discharge activity level: SUPERVISION LEVEL  Home/community participation: ACTIVE  Patient response SU:ORVIFB Literacy - How often do you need to have someone help you when you read instructions, pamphlets, or other written material from your doctor or pharmacy?: Never  Patient response PP:HKFEXM Isolation - How often do you feel lonely or isolated from those around you?: Never  Services provided included: MD, RD, PT, OT, SLP, RN, CM, Pharmacy, SW  Financial Services:  Financial Services Utilized: Private Insurance [employer] MEDICARE  Choices offered to/list presented to: PT AND FAMILY  Follow-up services arranged:  Outpatient    Outpatient Servicies: [facility], OT SP ON [address] WILL CALL FAMILY TO MAKE APPOINTMENTS      Patient response to transportation need: Is the patient able to respond to transportation needs?: Yes In the past 12 months, has lack of transportation kept you from medical appointments or from getting medications?: No In the past 12 months, has lack of transportation kept you from meetings, work, or from getting things needed for daily living?: No    Comments (or additional information): PT IS READY TO [place].  Patient/Family verbalized understanding of follow-up arrangements:  Yes  Individual responsible for coordination of the follow-up plan: [facility] [phone]  Confirmed correct DME delivered: [name] 12/16/2021    Lockie Bothun, [place] Rhyme

## 2021-12-17 ENCOUNTER — Other Ambulatory Visit (HOSPITAL_COMMUNITY): Payer: Self-pay

## 2021-12-20 ENCOUNTER — Ambulatory Visit: Payer: Medicare HMO | Attending: Cardiology

## 2021-12-20 DIAGNOSIS — Z5181 Encounter for therapeutic drug level monitoring: Secondary | ICD-10-CM | POA: Diagnosis not present

## 2021-12-20 LAB — POCT INR: INR: 4.4 — AB (ref 2.0–3.0)

## 2021-12-20 NOTE — Patient Instructions (Signed)
HOLD TOMORROW, SINCE YOU HAVE TAKEN TODAY'S DOSE.  Then continue 1.5 tablets Daily. Stay consistent with greens each week (2-3 times per week).  Recheck INR in 1 week. Call coumadin clinic for any changes in medications or up coming procedures. [phone].

## 2021-12-24 DIAGNOSIS — Z8679 Personal history of other diseases of the circulatory system: Secondary | ICD-10-CM | POA: Diagnosis not present

## 2021-12-24 DIAGNOSIS — Z23 Encounter for immunization: Secondary | ICD-10-CM | POA: Diagnosis not present

## 2021-12-24 DIAGNOSIS — I4819 Other persistent atrial fibrillation: Secondary | ICD-10-CM | POA: Diagnosis not present

## 2021-12-24 DIAGNOSIS — Z0000 Encounter for general adult medical examination without abnormal findings: Secondary | ICD-10-CM | POA: Diagnosis not present

## 2021-12-24 DIAGNOSIS — N1831 Chronic kidney disease, stage 3a: Secondary | ICD-10-CM | POA: Diagnosis not present

## 2021-12-24 DIAGNOSIS — D6869 Other thrombophilia: Secondary | ICD-10-CM | POA: Diagnosis not present

## 2021-12-24 DIAGNOSIS — E1169 Type 2 diabetes mellitus with other specified complication: Secondary | ICD-10-CM | POA: Diagnosis not present

## 2021-12-24 DIAGNOSIS — G620 Drug-induced polyneuropathy: Secondary | ICD-10-CM | POA: Diagnosis not present

## 2021-12-31 ENCOUNTER — Ambulatory Visit: Payer: Medicare HMO | Attending: Cardiology

## 2021-12-31 DIAGNOSIS — Z954 Presence of other heart-valve replacement: Secondary | ICD-10-CM

## 2021-12-31 DIAGNOSIS — Z5181 Encounter for therapeutic drug level monitoring: Secondary | ICD-10-CM | POA: Diagnosis not present

## 2021-12-31 DIAGNOSIS — I480 Paroxysmal atrial fibrillation: Secondary | ICD-10-CM | POA: Diagnosis not present

## 2021-12-31 LAB — POCT INR: INR: 2.2 (ref 2.0–3.0)

## 2021-12-31 NOTE — Therapy (Unsigned)
OUTPATIENT SPEECH LANGUAGE PATHOLOGY APHASIA EVALUATION   Patient Name: [name] MRN: [id number] DOB:Nov 27, 1955, 66 y.o., female Today's Date: 01/06/2022  PCP: Lennie Odor, [place] REFERRING PROVIDER: Cathlyn Parsons., PA-C  END OF SESSION:  End of Session - 01/06/22 0955     Visit Number 1    Number of Visits 25    Date for SLP Re-Evaluation 03/31/22    Authorization Type [employer]    Progress Note Due on Visit 10    SLP Start Time 0932    SLP Stop Time  1015    SLP Time Calculation (min) 43 min             Past Medical History:  Diagnosis Date   Abnormal chest CT    Anemia    Aortic atherosclerosis (HCC)    Asthma    Atrial enlargement, left    severe   Atrial fibrillation ([place]) 01/05/2011   Chronic persistent, failed DCCV    Atrial flutter (HCC)    Bronchospasm 1998   Cardiomegaly    Carotid bruit    Chronic diastolic congestive heart failure ([place])    Diabetes mellitus    Type II   Ejection fraction < 50%    35-40%,    Heart murmur    History of blood transfusion    History of radiation therapy 03/18/19-04/15/19   endometrial - vaginal brachytherapy -  Dr. Sondra Come    Hypercholesterolemia    Hypertension    Junctional rhythm 09/14/2018   Noted on EKG   Left bundle branch block (LBBB) 09/14/2018   Noted on EKG   LVH (left ventricular hypertrophy) 10/10/2016   Moderate, Noted on ECHO   Mitral regurgitation    Obesity    Obesity (BMI 30-39.9) 01/16/2012   Persistent atrial fibrillation ([place])    Polyp of rectum    Rheumatic fever 01/16/2012   Reported during childhood   S/P Maze operation for atrial fibrillation 02/15/2012   Complete biatrial lesion set using bipolar radiofrequency and cryothermy ablation with clipping of LA appendage   S/P mitral valve replacement with metallic valve 37/10/6267   65m Sorin [employer] Optiform mechanical prosthesis   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   234mEdwards mc3 ring annuloplasty    Shortness of breath    with exertion   Sleep apnea    DOES NOT HAVE CPAP   [place]   Post op , left arm weakness   Tricuspid regurgitation 01/16/2012   [facility]   Past Surgical History:  Procedure Laterality Date   CARDIAC CATHETERIZATION  >5 years   CARDIOVASCULAR STRESS TEST  09/2010   CARDIOVERSION  03/25/2011   Procedure: CARDIOVERSION;  Surgeon: JaJettie BoozeMD;  [facility] Service: Cardiovascular;  Laterality: N/A;   CARDIOVERSION N/A 07/27/2012   Procedure: CARDIOVERSION;  Surgeon: JaJettie BoozeMD;  [facility] Service: Cardiovascular;  Laterality: N/A;   CESAREAN SECTION     COLONOSCOPY WITH PROPOFOL N/A 04/27/2016   Procedure: COLONOSCOPY WITH PROPOFOL;  Surgeon: ViWilford CornerMD;  [facility];  Service: Endoscopy;  Laterality: N/A;   CRANIOTOMY Left 12/03/2021   Procedure: FRONTOTEMPORAL CRANIOTOMY FOR EVACUATION SUBDURAL HEMATOMA;  Surgeon: DaKarsten RoDO;  Location: [facility] Service: Neurosurgery;  Laterality: Left;   INTRAOPERATIVE TRANSESOPHAGEAL ECHOCARDIOGRAM  02/15/2012   Procedure: INTRAOPERATIVE TRANSESOPHAGEAL ECHOCARDIOGRAM;  Surgeon: ClRexene AlbertsMD;  [facility] Service: Open Heart Surgery;  Laterality: N/A;  MAZE  02/15/2012   Procedure: MAZE;  Surgeon: Rexene Alberts, MD;  Location: [place];  Service: Open Heart Surgery;  Laterality: N/A;   MITRAL VALVE REPLACEMENT  02/15/2012   Procedure: MITRAL VALVE (MV) REPLACEMENT;  Surgeon: Rexene Alberts, MD;  Location: [place];  Service: Open Heart Surgery;  Laterality: N/A;   ROBOTIC ASSISTED LAPAROSCOPIC HYSTERECTOMY AND SALPINGECTOMY Bilateral 01/02/2019   Procedure: XI ROBOTIC ASSISTED LAPAROSCOPIC TOTAL HYSTERECTOMY WITH BILATERAL SALPINGOOPHORECTOMY, SENTINEL LYMPH NODE BIOPSY;  Surgeon: [name], MD;  Location[facility];  Service: Gynecology;  Laterality: Bilateral;   TEE WITHOUT CARDIOVERSION  12/21/2011   Procedure:  TRANSESOPHAGEAL ECHOCARDIOGRAM (TEE);  Surgeon: Jettie Booze, MD;  Location: [place];  Service: Cardiovascular;  Laterality: N/A;   TOOTH EXTRACTION N/A 11/19/2021   Procedure: DENTAL EXTRACTION TEETH NUMBER 14, 19, 30, 31;  Surgeon: Diona Browner, DMD;  Location: [place];  Service: Oral Surgery;  Laterality: N/A;   TRICUSPID VALVE REPLACEMENT  02/15/2012   Procedure: TRICUSPID VALVE REPAIR;  Surgeon: Rexene Alberts, MD;  Location: [place];  Service: Open Heart Surgery;  Laterality: N/A;   TUBAL LIGATION     at time of her c-section   Patient Active Problem List   Diagnosis Date Noted   Hypertension associated with diabetes ([place]) 12/08/2021   Anticoagulated on warfarin 12/07/2021   Acute subdural hematoma ([place]) 12/02/2021   Chronic anticoagulation - on coumadin for mechanical mitral valve, afib 12/02/2021   Subdural hematoma, nontraumatic ([place]) 12/02/2021   Pancytopenia, acquired ([place]) 06/21/2019   Peripheral neuropathy due to chemotherapy ([place]) 06/21/2019   Anemia, chronic disease 05/21/2019   Drug-induced hyperglycemia 02/25/2019   Stage 3a chronic kidney disease (CKD) ([place]) 02/04/2019   Iron deficiency anemia 01/18/2019   Endometrial cancer ([place]) 01/02/2019   Uterine cancer ([place]) 12/27/2018   Special screening for malignant neoplasms, colon 04/27/2016   LBBB (left bundle branch block) 10/07/2013   Encounter for therapeutic drug monitoring 03/19/2013   Mitral valve disorders(424.0) 10/12/2012   Heart valve replaced by other means 10/12/2012   Atrial flutter ([place])    CVA (cerebral infarction) 04/14/2012   S/P TVR (tricuspid valve repair) 03/19/2012   S/P MVR (mitral valve replacement) 03/19/2012   History of mitral valve replacement with mechanical valve 02/15/2012   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   S/P Maze operation for atrial fibrillation 02/15/2012   CHF (congestive heart failure) ([place]) 02/06/2012   MR (mitral regurgitation) 02/06/2012   Tricuspid  regurgitation 01/16/2012   Rheumatic fever 01/16/2012   Obesity (BMI 30-39.9) 01/16/2012   Mitral regurgitation    Chronic diastolic CHF (congestive heart failure) ([place]) 12/19/2011   Snoring 02/28/2011   Atrial fibrillation ([place]) 42/70/6237   Chronic systolic dysfunction of left ventricle 01/05/2011   Hypertension 10/18/2010   Type 2 diabetes mellitus ([place]) 10/18/2010   Hypercholesterolemia 10/18/2010   Mediastinal lymphadenopathy 10/18/2010   Asthma 10/18/2010    ONSET DATE: 12-02-2021   REFERRING DIAG: S28.5XAA (ICD-10-CM) - SDH (subdural hematoma) (HCC)   THERAPY DIAG:  Aphasia  Cognitive communication deficit  Rationale for Evaluation and Treatment: Rehabilitation  SUBJECTIVE:   SUBJECTIVE STATEMENT: "I need speech all the way" Pt accompanied by: self  PERTINENT HISTORY: L-[facility] 2014, dental procedure 11-19-2021 resulting in ongoing bleeding. Presented to ED 12-02-2021, imaging revealed acute on chronic SDH. While admitted began showing speech disturbances. Underwent L frontotemporal craniotomy for evacuation of hematoma 12-03-2021. Received ST services while admitted to inpatient rehab.   PAIN:  Are you having pain?  No  FALLS: Has patient fallen in last 6 months?  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 As before, these collections measure up to 16 mm in thickness on the left and 6 mm on the right. Unchanged mass effect upon the left cerebral hemisphere with partial effacement of the left lateral ventricle and 8 mm rightward midline shift.  COGNITION: Overall cognitive status: No family/caregiver present to determine baseline cognitive  functioning Areas of impairment:  Memory: Impaired: Working Educational psychologist function: Impaired: Problem solving, Organization, Planning, and Slow processing Functional deficits: Requires A from daughter for A with activities such as financial, medication, and schedule management. Unclear if d/t language impairment or cognitive deficits.  Comments: maintains attention and engagement throughout entirety of evaluation, is oriented to person, place, situation, time  AUDITORY COMPREHENSION: Overall auditory comprehension: Impaired: moderately complex YES/NO questions: Appears intact, given extra time to process Following directions: Appears intact Conversation: Moderately Complex Interfering components: processing speed Effective technique: extra processing time, repetition/stressing words, and simplified and direct language  READING COMPREHENSION: Impaired: word Comments: area of primary concern for pt. Is able to read simple words with typical orthographic/phonemic correspondence (e.g. "tin, cat"). Increased complexity 0% accuracy.   EXPRESSION: verbal and nonverbal - written  VERBAL EXPRESSION: Level of generative/spontaneous verbalization: sentence Automatic speech: month of year: impaired- requires usual repetition and extended time, paraphasia for "February," with retrospective awareness  Repetition: Impaired: Word impairment becomes apparent at mult-syllabic word level Naming: Confrontation: 50%  evidences telescoping ("ocpus" for octopus) and semantic paraphasias Pragmatics: Appears intact Effective technique: semantic cues, phonemic cues, and articulatory cues Non-verbal means of communication: N/A Comments: usual halting speech, usual anomia evidenced by extended pauses and saying "what is the thing" while thinking. No anomia compensations evidenced.   WRITTEN EXPRESSION: Dominant hand: right Written expression: Impaired: word Comments: able to write  family names with x1 self-correction; says unable to write numbers with verbal A, can write 1-10; answers personally revelent question with 2 word response with max-A cues from SLP   MOTOR SPEECH: assessed across variety of speech tasks: word repetition, picture description, discourse sample Overall motor speech: impaired Level of impairment: Word Rate of Speech: WFL Dysfluencies: stutter like dysfluencies (sound/syllable repetitions) Phonation: normal Voice Quality: normal Respiration: thoracic breathing Word and Phrasal Stress: reduced use of stress Resonance: WFL Articulation: Appears intact Diadochokinetic Rate (DDK): slow rate Intelligibility: Intelligible Motor planning: Impaired: groping for words and inconsistent Effective technique: slow rate  ORAL MOTOR EXAMINATION: Comments: appear within gross functional limits though tongue was noted to deviate to R upon protrusion  STANDARDIZED ASSESSMENTS: QAB: Moderate  PATIENT REPORTED OUTCOME MEASURES (PROM): Deferred d/t time, will complete initial therapy session  TODAY'S TREATMENT: 01-06-2022: Education provided on evaluation results and SLP's recommendations. Pt verbalizes agreement with POC, all questions answered to satisfaction.   PATIENT EDUCATION: Education details: see above Person educated: Patient Education method: Consulting civil engineer, Demonstration, Verbal cues, and Handouts Education comprehension: verbalized understanding, returned demonstration, and needs further education   GOALS: Goals reviewed with patient? Yes  SHORT TERM GOALS: Target date: 02-03-2022  Pt will successfully complete moderately complex language tasks in 90% of opportunities given occasional min-A over 2 sessions Baseline: Goal status: INITIAL  2.  Pt  will write 18/20 trained words accurately with occasional min-A  Baseline:  Goal status: INITIAL  3.  During structured exercise, pt will successful demonstrate anomia strategy with rare min-A  in 80% of trials  Baseline:  Goal status: INITIAL  4.  Pt will ID and attempt to correct paraphasia during structured activities in 50% of opportunities  Baseline:  Goal status: INITIAL   LONG TERM GOALS: Target date: 03-31-2022  Pt will complete HEP 5/7 days over 2 week period with min-A from family  Baseline:  Goal status: INITIAL  2.  Pt will report improvement via patient reported outcome measure by d/c Baseline: to be completed initial therapy session Goal status: INITIAL  3.  Pt will ID and attempt to correct paraphasia during structured activities in 80% of opportunities  Baseline:  Goal status: INITIAL  4.  Pt will use external support to A in reading of information on phone with mod-I following direct instruction Baseline:  Goal status: INITIAL  5.  Pt will generate approximate spelling of untrained words, efficacy or which to be judged by SLP being able to determine target in 80% of trials  Baseline:  Goal status: INITIAL   ASSESSMENT:  CLINICAL IMPRESSION: Patient is a 66 y.o. F who was seen today for cognitive linguistic evaluation. Pt presents with moderate expressive aphasia c/b effortful speech with usual word finding, occasional paraphasias. Additional impairments of agraphia and alexia. Based on chart review, appears pt's aphasia may be close to baseline from 2014 stroke, however family is not present to confirm or provide nuanced information. Per CIR documentation, perseveration and semantic paraphasias are acute to current event. Pt endorses reading and writing deficits are newly presenting as well. Auditory comprehension relative strength and within gross functional limits per clinical observations and performance on QAB, with extended time and occasional repetitions. Verbal expression is notable for frequent pausing d/t word finding deficits, agrammatism, and reduced complexity. Is able to express basic thoughts and ideas, to include asking question, with  extended time. Requires some inference from SLP, with confirmation to ensure understanding, to fully appreciate pt's intended message. Simple, one to one phonemic to orthographic representation intact for reading/writing, though laborious. Pt would benefit from skilled ST to optimize communication efficacy, reduce frustration and caregiver burden.  OBJECTIVE IMPAIRMENTS: include memory, executive functioning, receptive language, aphasia, and apraxia. These impairments are limiting patient from managing medications, managing appointments, managing finances, household responsibilities, ADLs/IADLs, and effectively communicating at home and in community. Factors affecting potential to achieve goals and functional outcome are previous level of function, severity of impairments, financial resources, and family/community support. Patient will benefit from skilled SLP services to address above impairments and improve overall function.  REHAB POTENTIAL: Fair (prior level of function s/p stroke 2014)  PLAN:  SLP FREQUENCY: 1-2x/week - SLP recommends 2x a week, pt elects to initiate therapy at 1x a week   SLP DURATION: 12 weeks  PLANNED INTERVENTIONS: Language facilitation, Cueing hierachy, Cognitive reorganization, Internal/external aids, Functional tasks, Multimodal communication approach, SLP instruction and feedback, Compensatory strategies, and Patient/family education  Su Monks, CCC-SLP 01/06/2022, 12:08 PM

## 2021-12-31 NOTE — Patient Instructions (Signed)
TAKE 2 TABLETS TODAY ONLY THEN continue 1.5 tablets Daily. Stay consistent with greens each week (2-3 times per week).  Recheck INR in 2 weeks. Call coumadin clinic for any changes in medications or up coming procedures. [phone].

## 2022-01-05 NOTE — Therapy (Unsigned)
OUTPATIENT OCCUPATIONAL THERAPY NEURO EVALUATION  Patient Name: [name] MRN: [id number] DOB:04-Feb-1955, 66 y.o., female Today's Date: 01/06/2022  PCP: Lennie Odor, [place]  REFERRING PROVIDER: Cathlyn Parsons, PA-C  END OF SESSION:  OT End of Session - 01/06/22 1055     Visit Number 1    Number of Visits 9    Date for OT Re-Evaluation 03/04/22    Authorization Type [employer] - requires auth    Progress Note Due on Visit 9    OT Start Time 1016    OT Stop Time 1050    OT Time Calculation (min) 34 min    Activity Tolerance Patient tolerated treatment well    Behavior During Therapy WFL for tasks assessed/performed             Past Medical History:  Diagnosis Date   Abnormal chest CT    Anemia    Aortic atherosclerosis (HCC)    Asthma    Atrial enlargement, left    severe   Atrial fibrillation ([place]) 01/05/2011   Chronic persistent, failed DCCV    Atrial flutter ([place])    Bronchospasm 1998   Cardiomegaly    Carotid bruit    Chronic diastolic congestive heart failure ([place])    Diabetes mellitus    Type II   Ejection fraction < 50%    35-40%,    Heart murmur    History of blood transfusion    History of radiation therapy 03/18/19-04/15/19   endometrial - vaginal brachytherapy -  Dr. Sondra Come    Hypercholesterolemia    Hypertension    Junctional rhythm 09/14/2018   Noted on EKG   Left bundle branch block (LBBB) 09/14/2018   Noted on EKG   LVH (left ventricular hypertrophy) 10/10/2016   Moderate, Noted on ECHO   Mitral regurgitation    Obesity    Obesity (BMI 30-39.9) 01/16/2012   Persistent atrial fibrillation ([place])    Polyp of rectum    Rheumatic fever 01/16/2012   Reported during childhood   S/P Maze operation for atrial fibrillation 02/15/2012   Complete biatrial lesion set using bipolar radiofrequency and cryothermy ablation with clipping of LA appendage   S/P mitral valve replacement with metallic valve 03/50/0938   72m Sorin [employer]  Optiform mechanical prosthesis   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   217mEdwards mc3 ring annuloplasty   Shortness of breath    with exertion   Sleep apnea    DOES NOT HAVE CPAP   Stroke ([place]   Post op , left arm weakness   Tricuspid regurgitation 01/16/2012   [facility]   Past Surgical History:  Procedure Laterality Date   CARDIAC CATHETERIZATION  >5 years   CARDIOVASCULAR STRESS TEST  09/2010   CARDIOVERSION  03/25/2011   Procedure: CARDIOVERSION;  Surgeon: JaJettie BoozeMD;  [facility] Service: Cardiovascular;  Laterality: N/A;   CARDIOVERSION N/A 07/27/2012   Procedure: CARDIOVERSION;  Surgeon: JaJettie BoozeMD;  [facility] Service: Cardiovascular;  Laterality: N/A;   CESAREAN SECTION     COLONOSCOPY WITH PROPOFOL N/A 04/27/2016   Procedure: COLONOSCOPY WITH PROPOFOL;  Surgeon: ViWilford CornerMD;  [facility];  Service: Endoscopy;  Laterality: N/A;   CRANIOTOMY Left 12/03/2021   Procedure: FRONTOTEMPORAL CRANIOTOMY FOR EVACUATION SUBDURAL HEMATOMA;  Surgeon: DaKarsten RoDO;  [facility] Service: Neurosurgery;  Laterality: Left;   INTRAOPERATIVE TRANSESOPHAGEAL ECHOCARDIOGRAM  02/15/2012   Procedure: INTRAOPERATIVE TRANSESOPHAGEAL  ECHOCARDIOGRAM;  Surgeon: Rexene Alberts, MD;  Location: [place];  Service: Open Heart Surgery;  Laterality: N/A;   MAZE  02/15/2012   Procedure: MAZE;  Surgeon: Rexene Alberts, MD;  Location: [place];  Service: Open Heart Surgery;  Laterality: N/A;   MITRAL VALVE REPLACEMENT  02/15/2012   Procedure: MITRAL VALVE (MV) REPLACEMENT;  Surgeon: Rexene Alberts, MD;  Location: [place];  Service: Open Heart Surgery;  Laterality: N/A;   ROBOTIC ASSISTED LAPAROSCOPIC HYSTERECTOMY AND SALPINGECTOMY Bilateral 01/02/2019   Procedure: XI ROBOTIC ASSISTED LAPAROSCOPIC TOTAL HYSTERECTOMY WITH BILATERAL SALPINGOOPHORECTOMY, SENTINEL LYMPH NODE BIOPSY;  Surgeon: [name], MD;  [facility];  Service: Gynecology;  Laterality: Bilateral;   TEE WITHOUT CARDIOVERSION  12/21/2011   Procedure: TRANSESOPHAGEAL ECHOCARDIOGRAM (TEE);  Surgeon: Jettie Booze, MD;  Location: [place];  Service: Cardiovascular;  Laterality: N/A;   TOOTH EXTRACTION N/A 11/19/2021   Procedure: DENTAL EXTRACTION TEETH NUMBER 14, 19, 30, 31;  Surgeon: Diona Browner, DMD;  Location: [place];  Service: Oral Surgery;  Laterality: N/A;   TRICUSPID VALVE REPLACEMENT  02/15/2012   Procedure: TRICUSPID VALVE REPAIR;  Surgeon: Rexene Alberts, MD;  Location: [place];  Service: Open Heart Surgery;  Laterality: N/A;   TUBAL LIGATION     at time of her c-section   Patient Active Problem List   Diagnosis Date Noted   Hypertension associated with diabetes ([place]) 12/08/2021   Anticoagulated on warfarin 12/07/2021   Acute subdural hematoma ([place]) 12/02/2021   Chronic anticoagulation - on coumadin for mechanical mitral valve, afib 12/02/2021   Subdural hematoma, nontraumatic ([place]) 12/02/2021   Pancytopenia, acquired ([place]) 06/21/2019   Peripheral neuropathy due to chemotherapy ([place]) 06/21/2019   Anemia, chronic disease 05/21/2019   Drug-induced hyperglycemia 02/25/2019   Stage 3a chronic kidney disease (CKD) ([place]) 02/04/2019   Iron deficiency anemia 01/18/2019   Endometrial cancer ([place]) 01/02/2019   Uterine cancer ([place]) 12/27/2018   Special screening for malignant neoplasms, colon 04/27/2016   LBBB (left bundle branch block) 10/07/2013   Encounter for therapeutic drug monitoring 03/19/2013   Mitral valve disorders(424.0) 10/12/2012   Heart valve replaced by other means 10/12/2012   Atrial flutter ([place])    CVA (cerebral infarction) 04/14/2012   S/P TVR (tricuspid valve repair) 03/19/2012   S/P MVR (mitral valve replacement) 03/19/2012   History of mitral valve replacement with mechanical valve 02/15/2012   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   S/P Maze operation for atrial fibrillation 02/15/2012   CHF  (congestive heart failure) ([place]) 02/06/2012   MR (mitral regurgitation) 02/06/2012   Tricuspid regurgitation 01/16/2012   Rheumatic fever 01/16/2012   Obesity (BMI 30-39.9) 01/16/2012   Mitral regurgitation    Chronic diastolic CHF (congestive heart failure) ([place]) 12/19/2011   Snoring 02/28/2011   Atrial fibrillation ([place]) 47/82/9562   Chronic systolic dysfunction of left ventricle 01/05/2011   Hypertension 10/18/2010   Type 2 diabetes mellitus ([place]) 10/18/2010   Hypercholesterolemia 10/18/2010   Mediastinal lymphadenopathy 10/18/2010   Asthma 10/18/2010    ONSET DATE: Pt had teeth extracted on 01/19/2021 with continued bleeding; she presented the ED on 12/01/2021; unknown onset date of SDH; admitted to IRF 12/10/2021; discharged 12/16/2021  REFERRING DIAG: S06.5XAA (ICD-10-CM) - SDH (subdural hematoma)   THERAPY DIAG:  Muscle weakness (generalized)  Other symptoms and signs involving the musculoskeletal system  Subdural hematoma, nontraumatic (HCC)  Rationale for Evaluation and Treatment: Rehabilitation  SUBJECTIVE:   SUBJECTIVE STATEMENT: She has been able to do a  lot of things since coming home but still lacks strength and use of her LUE at PLOF. She has had difficulty with handwriting but attributes this to her cognition.  Pt accompanied by: self  PERTINENT HISTORY:   "DEJUANA WEIST is a 66 y.o. female with history of CHF, OSA, CKD, MVR-chronic Coumadin, left MCA stroke 2014, recent dental extraction who was evaluated in the ED 12/02/21 due to issues with ongoing bleeding since her procedure.  She will did report headaches which was followed by somnolence with confusion and CT of head done showing acute on chronic SDH with 16 mm shift to the left, 8 mm left to right midline shift and effacement of left greater than right psych eye.  INR at admission was 2.4 and was reversed with Kcentra.  Dr. Reatha Armour was consulted and she underwent left frontotemporal craniotomy for evacuation of  hematoma on 11/24.  She was started on Keppra for seizure prophylaxis and cleared to start IV heparin on 11/27.  Recommendations were to repeat CT once heparin was therapeutic prior to transition to oral AC.     On 11/29 she had significant worsening of aphasia, problems with ADL and decreased ability to follow commands with more of a left bias with left gaze preference.  EEG was done showing area of epileptogenicity arising from the left frontal region with cortical dysfunction left hemisphere.  Repeat CT head showed mixed density left frontal parietal and temporal lobe which was unchanged and new small right temporal SDH which was unchanged.  She was cleared to resume Coumadin.  She had improvement in motor planning deficits with no overt left neglect or bias per recent therapy notes.  She continued to be intact bilaterally, motor planning deficits, minimal verbalization with decreasing carryover.  CIR was recommended due to functional decline."  PRECAUTIONS: Fall and Other: No driving for at least 6 months or  till cleared by neurology.   WEIGHT BEARING RESTRICTIONS: No  PAIN:  Are you having pain? No  FALLS: Has patient fallen in last 6 months? No  LIVING ENVIRONMENT: Lives with: lives with their spouse and lives with their son Lives in: House/apartment Stairs: Yes: External: 1 steps; none Has following equipment at home: Quad cane large base, shower chair, and bed side commode  PLOF: Independent; driving; picked up grandkids  PATIENT GOALS: return to PLOF  OBJECTIVE:   HAND DOMINANCE: Right  ADLs: Overall ADLs: mod I  IADLs: Requires min A with most due to mobility and cognition  MOBILITY STATUS: Independent  ACTIVITY TOLERANCE: Activity tolerance: good  FUNCTIONAL OUTCOME MEASURES: FOTO: Severity: Slight (Intake FS: 88) ; expected score at d/c: 95  UPPER EXTREMITY ROM:     AROM Right (eval) Left (eval)  Shoulder flexion WNL WNL  Shoulder abduction WNL WNL  Elbow  flexion WNL WNL  Elbow extension WNL WNL  Wrist flexion WNL WNL  Wrist extension WNL WNL  Wrist pronation WNL WNL  Wrist supination WNL WNL   Digit Composite Flexion WNL WNL  Digit Composite Extension WNL WNL  Digit Opposition WNL Dyskinesia noted  (Blank rows = not tested)  UPPER EXTREMITY MMT:     MMT Right (eval) Left (eval)  Shoulder flexion WNL WNL  Shoulder abduction WNL WNL  Elbow flexion WNL WNL  Elbow extension WNL WNL  (Blank rows = not tested)  HAND FUNCTION: Grip strength: Right: 66.3 lbs; Left: 46.7 lbs  COORDINATION: 9 Hole Peg test: Right: 31 sec; Left: 40 sec  SENSATION: WFL  EDEMA:  none  MUSCLE TONE: RUE: Within functional limits and LUE: Within functional limits  COGNITION: Overall cognitive status: Impaired; see ST eval  VISION: Subjective report: no changes Baseline vision: Wears glasses for reading only PRN; chose not to drive at night Visual history:  n/a  VISION ASSESSMENT: WFL  PERCEPTION: WFL  PRAXIS: WFL  OBSERVATIONS: Pt ambulates with quadcane. No LOB noted. Pt appears well kept and is able to answer most questions appropriately with increased time. Pt able to write full name without error, but when asked to write the day of the week, "Thursday", she wrote "Thurday".    TODAY'S TREATMENT:                                                                                                                              - Therapeutic exercises completed for duration as noted below including:   OT initiated red theraputty exercises (search, grip, pinch) as noted in patient instructions for coordination and strength  PATIENT EDUCATION: Education details: POC; HEP Person educated: Patient Education method: Consulting civil engineer, Demonstration, Verbal cues, and Handouts Education comprehension: verbalized understanding, returned demonstration, and needs further education  HOME EXERCISE PROGRAM: 12/28 - LUE red putty hep   GOALS:  SHORT TERM  GOALS: Target date: 02/03/2022   Patient will be independent with initial LUE HEP. Baseline: Goal status: INITIAL  2.  Patient will demonstrate at least 50 lbs L grip strength as needed to open jars and other containers. Baseline: 46.7 lbs Goal status: INITIAL   LONG TERM GOALS: Target date: 03/04/2022    Patient will demonstrate updated LUE HEP with 25% verbal cues or less for proper execution. Baseline:  Goal status: INITIAL  2.  Patient will demonstrate at least 56.7 lbs L grip strength as needed to open jars and other containers. Baseline: 46.7 lbs Goal status: INITIAL  3.  Patient will complete nine-hole peg with use of L in 32 seconds or less. Baseline: 40 seconds Goal status: INITIAL  4.  Pt will complete FOTO assessment at time of discharge scoring 95 or greater indicating functional progression with ADL and IADL completion.. Baseline: 88 Goal status: INITIAL  5.  Pt will report completing typical IADLs mod I with good safety as evidenced in therapy clinic.  Baseline: requiring at least min A with supervision due to cognition Goal status: INITIAL  ASSESSMENT:  CLINICAL IMPRESSION: Patient is a 66 y.o. female who was seen today for occupational therapy evaluation following SDH (~11/2021). Hx includes A-fib, asthma, CHF, DMII, uterine cancer with radiation, HTN, Maze procedure, valve replacements, CVA (2014), and left frontotemporal craniotomy (12/03/2021). Patient currently presents below baseline level of functioning demonstrating functional deficits and impairments as noted below. Pt would benefit from skilled OT services in the outpatient setting to work on impairments as noted below to help pt return to PLOF as able.    PERFORMANCE DEFICITS: in functional skills including ADLs, IADLs, coordination, strength, and UE functional use, cognitive skills  including memory, problem solving, safety awareness, sequencing, and understand.  IMPAIRMENTS: are limiting patient from  ADLs, IADLs, leisure, and social participation.   CO-MORBIDITIES: may have co-morbidities  that affects occupational performance. Patient will benefit from skilled OT to address above impairments and improve overall function.  MODIFICATION OR ASSISTANCE TO COMPLETE EVALUATION: No modification of tasks or assist necessary to complete an evaluation.  OT OCCUPATIONAL PROFILE AND HISTORY: Problem focused assessment: Including review of records relating to presenting problem.  CLINICAL DECISION MAKING: LOW - limited treatment options, no task modification necessary  REHAB POTENTIAL: Good  EVALUATION COMPLEXITY: Low    PLAN:  OT FREQUENCY: 1x/week  OT DURATION: 8 weeks  PLANNED INTERVENTIONS: self care/ADL training, therapeutic exercise, therapeutic activity, neuromuscular re-education, functional mobility training, moist heat, patient/family education, cognitive remediation/compensation, visual/perceptual remediation/compensation, energy conservation, DME and/or AE instructions, and Re-evaluation  RECOMMENDED OTHER SERVICES: none at this time  CONSULTED AND AGREED WITH PLAN OF CARE: Patient  PLAN FOR NEXT SESSION: review putty HEP; functional/cognitive tasks; higher level coordination;    Dennis Bast, OT 01/06/2022, 10:58 AM

## 2022-01-06 ENCOUNTER — Ambulatory Visit: Payer: Medicare HMO | Admitting: Occupational Therapy

## 2022-01-06 ENCOUNTER — Encounter: Payer: Self-pay | Admitting: Occupational Therapy

## 2022-01-06 ENCOUNTER — Ambulatory Visit: Payer: Medicare HMO | Attending: Physician Assistant | Admitting: Physical Therapy

## 2022-01-06 ENCOUNTER — Encounter: Payer: Self-pay | Admitting: Speech Pathology

## 2022-01-06 ENCOUNTER — Encounter: Payer: Self-pay | Admitting: Physical Therapy

## 2022-01-06 ENCOUNTER — Ambulatory Visit: Payer: Medicare HMO | Admitting: Speech Pathology

## 2022-01-06 VITALS — BP 138/79 | HR 71

## 2022-01-06 DIAGNOSIS — M6281 Muscle weakness (generalized): Secondary | ICD-10-CM | POA: Insufficient documentation

## 2022-01-06 DIAGNOSIS — R41841 Cognitive communication deficit: Secondary | ICD-10-CM | POA: Insufficient documentation

## 2022-01-06 DIAGNOSIS — R269 Unspecified abnormalities of gait and mobility: Secondary | ICD-10-CM | POA: Diagnosis not present

## 2022-01-06 DIAGNOSIS — R2681 Unsteadiness on feet: Secondary | ICD-10-CM | POA: Diagnosis not present

## 2022-01-06 DIAGNOSIS — I6200 Nontraumatic subdural hemorrhage, unspecified: Secondary | ICD-10-CM | POA: Insufficient documentation

## 2022-01-06 DIAGNOSIS — R29898 Other symptoms and signs involving the musculoskeletal system: Secondary | ICD-10-CM | POA: Insufficient documentation

## 2022-01-06 DIAGNOSIS — R4701 Aphasia: Secondary | ICD-10-CM | POA: Diagnosis not present

## 2022-01-06 NOTE — Therapy (Signed)
OUTPATIENT PHYSICAL THERAPY NEURO EVALUATION   Patient Name: [name] MRN: [id number] DOB:Oct 11, 1955, 66 y.o., female Today's Date: 01/06/2022   PCP: Lennie Odor, [place] REFERRING PROVIDER: Cathlyn Parsons, PA-C  END OF SESSION:  PT End of Session - 01/06/22 0925     Visit Number 1    Number of Visits 7    Date for PT Re-Evaluation 03/03/22    Authorization Type [employer]    PT Start Time 1055    PT Stop Time 1140    PT Time Calculation (min) 45 min    Equipment Utilized During Treatment Gait belt    Activity Tolerance Patient tolerated treatment well    Behavior During Therapy WFL for tasks assessed/performed             Past Medical History:  Diagnosis Date   Abnormal chest CT    Anemia    Aortic atherosclerosis (HCC)    Asthma    Atrial enlargement, left    severe   Atrial fibrillation ([place]) 01/05/2011   Chronic persistent, failed DCCV    Atrial flutter ([place])    Bronchospasm 1998   Cardiomegaly    Carotid bruit    Chronic diastolic congestive heart failure ([place])    Diabetes mellitus    Type II   Ejection fraction < 50%    35-40%,    Heart murmur    History of blood transfusion    History of radiation therapy 03/18/19-04/15/19   endometrial - vaginal brachytherapy -  Dr. Sondra Come    Hypercholesterolemia    Hypertension    Junctional rhythm 09/14/2018   Noted on EKG   Left bundle branch block (LBBB) 09/14/2018   Noted on EKG   LVH (left ventricular hypertrophy) 10/10/2016   Moderate, Noted on ECHO   Mitral regurgitation    Obesity    Obesity (BMI 30-39.9) 01/16/2012   Persistent atrial fibrillation ([place])    Polyp of rectum    Rheumatic fever 01/16/2012   Reported during childhood   S/P Maze operation for atrial fibrillation 02/15/2012   Complete biatrial lesion set using bipolar radiofrequency and cryothermy ablation with clipping of LA appendage   S/P mitral valve replacement with metallic valve 92/33/0076   64m [employer]in [employer]  Optiform mechanical prosthesis   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   231mEdwards mc3 ring annuloplasty   Shortness of breath    with exertion   Sleep apnea    DOES NOT HAVE CPAP   [place]   Post op , left arm weakness   Tricuspid regurgitation 01/16/2012   [facility]   Past Surgical History:  Procedure Laterality Date   CARDIAC CATHETERIZATION  >5 years   CARDIOVASCULAR STRESS TEST  09/2010   CARDIOVERSION  03/25/2011   Procedure: CARDIOVERSION;  Surgeon: JaJettie BoozeMD;  [facility]: Cardiovascular;  Laterality: N/A;   CARDIOVERSION N/A 07/27/2012   Procedure: CARDIOVERSION;  Surgeon: JaJettie BoozeMD;  [facility] Service: Cardiovascular;  Laterality: N/A;   CESAREAN SECTION     COLONOSCOPY WITH PROPOFOL N/A 04/27/2016   Procedure: COLONOSCOPY WITH PROPOFOL;  Surgeon: ViWilford CornerMD;  [facility];  Service: Endoscopy;  Laterality: N/A;   CRANIOTOMY Left 12/03/2021   Procedure: FRONTOTEMPORAL CRANIOTOMY FOR EVACUATION SUBDURAL HEMATOMA;  Surgeon: DaKarsten RoDO;  [facility] Service: Neurosurgery;  Laterality: Left;   INTRAOPERATIVE TRANSESOPHAGEAL ECHOCARDIOGRAM  02/15/2012   Procedure: INTRAOPERATIVE TRANSESOPHAGEAL ECHOCARDIOGRAM;  Surgeon: Rexene Alberts, MD;  Location: [place];  Service: Open Heart Surgery;  Laterality: N/A;   MAZE  02/15/2012   Procedure: MAZE;  Surgeon: Rexene Alberts, MD;  Location: [place];  Service: Open Heart Surgery;  Laterality: N/A;   MITRAL VALVE REPLACEMENT  02/15/2012   Procedure: MITRAL VALVE (MV) REPLACEMENT;  Surgeon: Rexene Alberts, MD;  Location: [place];  Service: Open Heart Surgery;  Laterality: N/A;   ROBOTIC ASSISTED LAPAROSCOPIC HYSTERECTOMY AND SALPINGECTOMY Bilateral 01/02/2019   Procedure: XI ROBOTIC ASSISTED LAPAROSCOPIC TOTAL HYSTERECTOMY WITH BILATERAL SALPINGOOPHORECTOMY, SENTINEL LYMPH NODE BIOPSY;  Surgeon: [name], MD;  [facility];  Service: Gynecology;  Laterality: Bilateral;   TEE WITHOUT CARDIOVERSION  12/21/2011   Procedure: TRANSESOPHAGEAL ECHOCARDIOGRAM (TEE);  Surgeon: Jettie Booze, MD;  Location: [place];  Service: Cardiovascular;  Laterality: N/A;   TOOTH EXTRACTION N/A 11/19/2021   Procedure: DENTAL EXTRACTION TEETH NUMBER 14, 19, 30, 31;  Surgeon: Diona Browner, DMD;  Location: [place];  Service: Oral Surgery;  Laterality: N/A;   TRICUSPID VALVE REPLACEMENT  02/15/2012   Procedure: TRICUSPID VALVE REPAIR;  Surgeon: Rexene Alberts, MD;  Location: [place];  Service: Open Heart Surgery;  Laterality: N/A;   TUBAL LIGATION     at time of her c-section   Patient Active Problem List   Diagnosis Date Noted   Hypertension associated with diabetes ([place]) 12/08/2021   Anticoagulated on warfarin 12/07/2021   Acute subdural hematoma ([place]) 12/02/2021   Chronic anticoagulation - on coumadin for mechanical mitral valve, afib 12/02/2021   Subdural hematoma, nontraumatic ([place]) 12/02/2021   Pancytopenia, acquired ([place]) 06/21/2019   Peripheral neuropathy due to chemotherapy ([place]) 06/21/2019   Anemia, chronic disease 05/21/2019   Drug-induced hyperglycemia 02/25/2019   Stage 3a chronic kidney disease (CKD) ([place]) 02/04/2019   Iron deficiency anemia 01/18/2019   Endometrial cancer ([place]) 01/02/2019   Uterine cancer ([place]) 12/27/2018   Special screening for malignant neoplasms, colon 04/27/2016   LBBB (left bundle branch block) 10/07/2013   Encounter for therapeutic drug monitoring 03/19/2013   Mitral valve disorders(424.0) 10/12/2012   Heart valve replaced by other means 10/12/2012   Atrial flutter ([place])    CVA (cerebral infarction) 04/14/2012   S/P TVR (tricuspid valve repair) 03/19/2012   S/P MVR (mitral valve replacement) 03/19/2012   History of mitral valve replacement with mechanical valve 02/15/2012   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   S/P Maze operation for atrial fibrillation 02/15/2012   CHF  (congestive heart failure) ([place]) 02/06/2012   MR (mitral regurgitation) 02/06/2012   Tricuspid regurgitation 01/16/2012   Rheumatic fever 01/16/2012   Obesity (BMI 30-39.9) 01/16/2012   Mitral regurgitation    Chronic diastolic CHF (congestive heart failure) ([place]) 12/19/2011   Snoring 02/28/2011   Atrial fibrillation ([place]) 50/27/7412   Chronic systolic dysfunction of left ventricle 01/05/2011   Hypertension 10/18/2010   Type 2 diabetes mellitus ([place]) 10/18/2010   Hypercholesterolemia 10/18/2010   Mediastinal lymphadenopathy 10/18/2010   Asthma 10/18/2010    ONSET DATE: 12/15/2021 (referral date)  REFERRING DIAG: S06.5XAA (ICD-10-CM) - SDH (subdural hematoma) (HCC)  THERAPY DIAG:  Abnormality of gait and mobility - Plan: PT plan of care cert/re-cert  Unsteadiness on feet - Plan: PT plan of care cert/re-cert  Rationale for Evaluation and Treatment: Rehabilitation  SUBJECTIVE:  SUBJECTIVE STATEMENT:  Patient is a 66 y.o. 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 Patient limited in providing past MH due to aphasia and communication limitations but confirmed information in chart listed below. Patient reports that her walking and getting around is going fine. She states that she has her cane but doesn't really feel like she needs it. She denies falls since the hospital. Before the SDH, patient was driving and did not need AD per her report. Her primary concern is her speech. Patient reports that she uses the cane primarily to get up on the porch. Patient reports that she is currently not doing any walking at home and her family will not her do certain activities outside of the home due to fear of falling and/or safety including driving.  Pt accompanied by: self  PERTINENT HISTORY:   Per  Note on 12/10/2021:  "Brief HPI:   [name] is a 66 y.o. female with history of CHF, OSA, CKD, MVR-chronic Coumadin, left MCA stroke 2014, recent dental extraction who was evaluated in the ED 12/02/21 due to issues with ongoing bleeding since her procedure.  She will did report headaches which was followed by somnolence with confusion and CT of head done showing acute on chronic SDH with 16 mm shift to the left, 8 mm left to right midline shift and effacement of left greater than right psych eye.  INR at admission was 2.4 and was reversed with Kcentra.  Dr. Reatha Armour was consulted and she underwent left frontotemporal craniotomy for evacuation of hematoma on 11/24.  She was started on Keppra for seizure prophylaxis and cleared to start IV heparin on 11/27.  Recommendations were to repeat CT once heparin was therapeutic prior to transition to oral [facility]."  Patient transferred to [facility] on 12/10/2021 per notes and saw all three disciplines. Finished skilled physical therapy in that setting on 12/15/2021.    PAIN:  Are you having pain? No  PRECAUTIONS: Fall and ICD/Pacemaker  WEIGHT BEARING RESTRICTIONS: No  FALLS: Has patient fallen in last 6 months?  Patient reports no falls in last 6 months  LIVING ENVIRONMENT: Lives with: lives with their family Lives in: House/apartment Stairs: Yes: External: 1 steps; none Has following equipment at home: Astronomer  PLOF: Independent  PATIENT GOALS: "I want my speech to be better. My family want me to work on my safety."  OBJECTIVE:   DIAGNOSTIC FINDINGS:  12/08/2021 CT Head w/o Contrast: "IMPRESSION: 1. Mixed density subdural hematoma on the left is unchanged. Mass-effect with 4 mm midline shift to the right unchanged. 2. Small right temporal subdural hematoma unchanged. 3. No new area of hemorrhage."  Vitals:   01/06/22 1105  BP: 138/79  Pulse: 71    COGNITION: Overall cognitive status:  Decreased  awareness of deficits   SENSATION: Light touch: Impaired L>R   COORDINATION: Dykinesia with heel to shin with RLE  EDEMA:  None noted  MUSCLE TONE: Bilateral tone most notable in hamstrings L > R (1+ on MAS); 2 beats of clonus bilaterally  POSTURE: rounded shoulders and forward head  LOWER EXTREMITY ROM:     Not assessed due to presence of tone  LOWER EXTREMITY MMT:    Not formally assessed due to presence of LE tone; slight increased in functional weakness on L > R  TRANSFERS: Assistive device utilized: Quad cane large base  Sit to stand: SBA Stand to sit: SBA Chair to chair: SBA Comment: requires  no use to come to stand  CURB:  Level of Assistance: CGA Assistive device utilized: Associate Professor Comments: Requires minA on compliant surface step up; initially does not adequately clear RLE and has to readjust   GAIT: Gait pattern: Patient ambulates with decreased arm swing with notably decreased stride, reduced hip and knee flexion, increased forefoot contact with use of LBQC that she intermittently carries so that it hovers a few inches above ground.  Level of assistance: SBA Comments: Patient intermittently changes which hand uses quad cane in but then switches to using it primarily in RUE but [facility] set for use on LUE. Therapist adjusted appropriately.  FUNCTIONAL TESTS:   5 times sit to stand: 21.76" with decreased eccentric control to sit and mild LOB on final sit to stand and able to recover independently  Timed up and go (TUG): 11.37"  2 minute walk test: 89.11m MCTSIB: Total Score: 120/120  10 MWT: 0.81 m/s  TUG: 11.52"  PATIENT SURVEYS:  ABC: 61.3%  TODAY'S TREATMENT:                                                                                                                                Not performed today- Initial Eval Only  PATIENT EDUCATION: Education details: LBQC technique/safety, examination findings/results, goals, POC Person  educated: Patient Education method: Explanation Education comprehension: verbalized understanding and needs further education  HOME EXERCISE PROGRAM: To be provided  GOALS: Goals reviewed with patient? Yes  LONG TERM GOALS: Target date: 03/03/2022 (LTG only given length of POC)  Patient will report demonstrate independence with final HEP in order to maintain current gains and continue to progress after physical therapy discharge.   Baseline: To be provided Goal status: INITIAL  2.  Patient will improve gait speed to 1.1 m/s or greater with LRAD to indicate a reduced risk for falls.   Baseline: 0.81 m/s Goal status: INITIAL  3.  Patient will improve their 5x Sit to Stand score to less than 15 seconds to demonstrate a decreased risk for falls and improved LE strength.   Baseline: 21.76" with decreased eccentric control to sit and mild LOB on final sit to stand and able to recover independently Goal status: INITIAL  4.  Patient will improve ABC Score to 67% or greater to indicate a decreased risk for falls and improved self-reported confidence in balance and sense of steadiness.   Baseline: 61.3% Goal status: INITIAL  5.  Patient will improve 2MWT with LRAD to 155.2 m or greater to achieve age reported norms and demonstrate improved cardiorespiratory endurance and mobility. Baseline: 89.616moal status: INITIAL   ASSESSMENT:  CLINICAL IMPRESSION: MaCINDEE MCLESTERs a [age].o. female with history of CHF, OSA, CKD, MVR-chronic Coumadin, left MCA stroke 2014, recent dental extraction who is being seen for an acute on chronic subdermal hematoma. Patient presents with deficits in gait, functional LE strength, balance, sensation, resulting in  overall decreased mobility and increased risk for falls. Patient presents in changes In baseline function including increased reliance on [facility] for mobility such as curb navigation. Patient demonstrates decreased cardiorespiratory endurance and is ambulating  below age reported norms as indicated by 2MWT. She demonstrates increased risk for falls and impaired LE strength as indicated by 5xSTS. She is also at an increased risk for falls as indicated by gait speed and ABC score < 67%. Patient will benefit from skilled physical therapy services to address these impairments and improve overall function.    OBJECTIVE IMPAIRMENTS: Abnormal gait, decreased balance, decreased knowledge of use of DME, decreased mobility, difficulty walking, decreased strength, decreased safety awareness, impaired sensation, and impaired tone.   ACTIVITY LIMITATIONS: standing, squatting, and caring for others  PARTICIPATION LIMITATIONS: driving and community activity  PERSONAL FACTORS: Age, Past/current experiences, and 1-2 comorbidities: see above  are also affecting patient's functional outcome.   REHAB POTENTIAL: Good  CLINICAL DECISION MAKING: Evolving/moderate complexity  EVALUATION COMPLEXITY: Moderate  PLAN:  PT FREQUENCY: 1x/week  PT DURATION: 8 weeks  PLANNED INTERVENTIONS: Therapeutic exercises, Therapeutic activity, Neuromuscular re-education, Gait training, Self Care, and Manual therapy  PLAN FOR NEXT SESSION: Dynamic gait tasks, sit to stands for speed, treadmill and increased gait speed  Malachi Carl, PT, DPT  01/06/2022, 12:46 PM

## 2022-01-09 ENCOUNTER — Other Ambulatory Visit: Payer: Self-pay | Admitting: Physical Medicine and Rehabilitation

## 2022-01-13 ENCOUNTER — Encounter: Payer: Self-pay | Admitting: Physical Therapy

## 2022-01-13 ENCOUNTER — Other Ambulatory Visit (HOSPITAL_COMMUNITY): Payer: Self-pay

## 2022-01-13 ENCOUNTER — Ambulatory Visit: Payer: Medicare HMO | Admitting: Speech Pathology

## 2022-01-13 ENCOUNTER — Ambulatory Visit: Payer: Medicare HMO | Attending: Physician Assistant | Admitting: Physical Therapy

## 2022-01-13 VITALS — BP 125/64 | HR 61

## 2022-01-13 DIAGNOSIS — M6281 Muscle weakness (generalized): Secondary | ICD-10-CM | POA: Diagnosis not present

## 2022-01-13 DIAGNOSIS — R41841 Cognitive communication deficit: Secondary | ICD-10-CM | POA: Insufficient documentation

## 2022-01-13 DIAGNOSIS — R2681 Unsteadiness on feet: Secondary | ICD-10-CM

## 2022-01-13 DIAGNOSIS — R4701 Aphasia: Secondary | ICD-10-CM | POA: Diagnosis not present

## 2022-01-13 DIAGNOSIS — R29898 Other symptoms and signs involving the musculoskeletal system: Secondary | ICD-10-CM | POA: Insufficient documentation

## 2022-01-13 DIAGNOSIS — R269 Unspecified abnormalities of gait and mobility: Secondary | ICD-10-CM | POA: Diagnosis not present

## 2022-01-13 NOTE — Patient Instructions (Signed)
Chimp  Chow  Shake  Thing  Path  Pent  Funk  Fin  Thin  [employer] - meet  Beat - meet  Rowe Robert   Remember "th" is tongue to teeth, "f" and "v" and bottom lip to top teeth

## 2022-01-13 NOTE — Therapy (Signed)
OUTPATIENT SPEECH LANGUAGE PATHOLOGY APHASIA EVALUATION   Patient Name: [name] MRN: [id number] DOB:Dec 13, 1955, 67 y.o., female Today's Date: 01/13/2022  PCP: Lennie Odor, [place] REFERRING PROVIDER: Cathlyn Parsons., PA-C  END OF SESSION:  End of Session - 01/13/22 1324     Visit Number 2    Number of Visits 25    Date for SLP Re-Evaluation 03/31/22    Authorization Type [employer]    Progress Note Due on Visit 10    SLP Start Time 44    SLP Stop Time  1950    SLP Time Calculation (min) 45 min    Activity Tolerance Patient tolerated treatment well             Past Medical History:  Diagnosis Date   Abnormal chest CT    Anemia    Aortic atherosclerosis (HCC)    Asthma    Atrial enlargement, left    severe   Atrial fibrillation ([place]) 01/05/2011   Chronic persistent, failed DCCV    Atrial flutter (HCC)    Bronchospasm 1998   Cardiomegaly    Carotid bruit    Chronic diastolic congestive heart failure ([place])    Diabetes mellitus    Type II   Ejection fraction < 50%    35-40%,    Heart murmur    History of blood transfusion    History of radiation therapy 03/18/19-04/15/19   endometrial - vaginal brachytherapy -  Dr. Sondra Come    Hypercholesterolemia    Hypertension    Junctional rhythm 09/14/2018   Noted on EKG   Left bundle branch block (LBBB) 09/14/2018   Noted on EKG   LVH (left ventricular hypertrophy) 10/10/2016   Moderate, Noted on ECHO   Mitral regurgitation    Obesity    Obesity (BMI 30-39.9) 01/16/2012   Persistent atrial fibrillation ([place])    Polyp of rectum    Rheumatic fever 01/16/2012   Reported during childhood   S/P Maze operation for atrial fibrillation 02/15/2012   Complete biatrial lesion set using bipolar radiofrequency and cryothermy ablation with clipping of LA appendage   S/P mitral valve replacement with metallic valve 93/26/7124   42m Sorin [employer] Optiform mechanical prosthesis   S/P tricuspid valve repair  02/15/2012   218mEdwards mc3 ring annuloplasty   Shortness of breath    with exertion   Sleep apnea    DOES NOT HAVE CPAP   St[id number]   Post op , left arm weakness   Tricuspid regurgitation 01/16/2012   [facility]   Past Surgical History:  Procedure Laterality Date   CARDIAC CATHETERIZATION  >5 years   CARDIOVASCULAR STRESS TEST  09/2010   CARDIOVERSION  03/25/2011   Procedure: CARDIOVERSION;  Surgeon: JaJettie BoozeMD;  [facility] Service: Cardiovascular;  Laterality: N/A;   CARDIOVERSION N/A 07/27/2012   Procedure: CARDIOVERSION;  Surgeon: JaJettie BoozeMD;  [facility] Service: Cardiovascular;  Laterality: N/A;   CESAREAN SECTION     COLONOSCOPY WITH PROPOFOL N/A 04/27/2016   Procedure: COLONOSCOPY WITH PROPOFOL;  Surgeon: ViWilford CornerMD;  [facility];  Service: Endoscopy;  Laterality: N/A;   CRANIOTOMY Left 12/03/2021   Procedure: FRONTOTEMPORAL CRANIOTOMY FOR EVACUATION SUBDURAL HEMATOMA;  Surgeon: DaKarsten RoDO;  [facility] Service: Neurosurgery;  Laterality: Left;   INTRAOPERATIVE TRANSESOPHAGEAL ECHOCARDIOGRAM  02/15/2012   Procedure: INTRAOPERATIVE TRANSESOPHAGEAL ECHOCARDIOGRAM;  Surgeon: ClRexene AlbertsMD;  [facility]  Service: Open Heart Surgery;  Laterality: N/A;   MAZE  02/15/2012   Procedure: MAZE;  Surgeon: Rexene Alberts, MD;  Location: [place];  Service: Open Heart Surgery;  Laterality: N/A;   MITRAL VALVE REPLACEMENT  02/15/2012   Procedure: MITRAL VALVE (MV) REPLACEMENT;  Surgeon: Rexene Alberts, MD;  Location: [place];  Service: Open Heart Surgery;  Laterality: N/A;   ROBOTIC ASSISTED LAPAROSCOPIC HYSTERECTOMY AND SALPINGECTOMY Bilateral 01/02/2019   Procedure: XI ROBOTIC ASSISTED LAPAROSCOPIC TOTAL HYSTERECTOMY WITH BILATERAL SALPINGOOPHORECTOMY, SENTINEL LYMPH NODE BIOPSY;  Surgeon: [name], MD;  [facility];  Service: Gynecology;  Laterality: Bilateral;   TEE  WITHOUT CARDIOVERSION  12/21/2011   Procedure: TRANSESOPHAGEAL ECHOCARDIOGRAM (TEE);  Surgeon: Jettie Booze, MD;  Location: [place];  Service: Cardiovascular;  Laterality: N/A;   TOOTH EXTRACTION N/A 11/19/2021   Procedure: DENTAL EXTRACTION TEETH NUMBER 14, 19, 30, 31;  Surgeon: Diona Browner, DMD;  Location: [place];  Service: Oral Surgery;  Laterality: N/A;   TRICUSPID VALVE REPLACEMENT  02/15/2012   Procedure: TRICUSPID VALVE REPAIR;  Surgeon: Rexene Alberts, MD;  Location: [place];  Service: Open Heart Surgery;  Laterality: N/A;   TUBAL LIGATION     at time of her c-section   Patient Active Problem List   Diagnosis Date Noted   Hypertension associated with diabetes ([place]) 12/08/2021   Anticoagulated on warfarin 12/07/2021   Acute subdural hematoma ([place]) 12/02/2021   Chronic anticoagulation - on coumadin for mechanical mitral valve, afib 12/02/2021   Subdural hematoma, nontraumatic ([place]) 12/02/2021   Pancytopenia, acquired ([place]) 06/21/2019   Peripheral neuropathy due to chemotherapy ([place]) 06/21/2019   Anemia, chronic disease 05/21/2019   Drug-induced hyperglycemia 02/25/2019   Stage 3a chronic kidney disease (CKD) ([place]) 02/04/2019   Iron deficiency anemia 01/18/2019   Endometrial cancer ([place]) 01/02/2019   Uterine cancer ([place]) 12/27/2018   Special screening for malignant neoplasms, colon 04/27/2016   LBBB (left bundle branch block) 10/07/2013   Encounter for therapeutic drug monitoring 03/19/2013   Mitral valve disorders(424.0) 10/12/2012   Heart valve replaced by other means 10/12/2012   Atrial flutter ([place])    CVA (cerebral infarction) 04/14/2012   S/P TVR (tricuspid valve repair) 03/19/2012   S/P MVR (mitral valve replacement) 03/19/2012   History of mitral valve replacement with mechanical valve 02/15/2012   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   S/P Maze operation for atrial fibrillation 02/15/2012   CHF (congestive heart failure) ([place]) 02/06/2012   MR (mitral  regurgitation) 02/06/2012   Tricuspid regurgitation 01/16/2012   Rheumatic fever 01/16/2012   Obesity (BMI 30-39.9) 01/16/2012   Mitral regurgitation    Chronic diastolic CHF (congestive heart failure) ([place]) 12/19/2011   Snoring 02/28/2011   Atrial fibrillation ([place]) 90/24/0973   Chronic systolic dysfunction of left ventricle 01/05/2011   Hypertension 10/18/2010   Type 2 diabetes mellitus ([place]) 10/18/2010   Hypercholesterolemia 10/18/2010   Mediastinal lymphadenopathy 10/18/2010   Asthma 10/18/2010    ONSET DATE: 12-02-2021   REFERRING DIAG: Z32.5XAA (ICD-10-CM) - SDH (subdural hematoma) (HCC)   THERAPY DIAG:  Aphasia  Cognitive communication deficit  Rationale for Evaluation and Treatment: Rehabilitation  SUBJECTIVE:   SUBJECTIVE STATEMENT: "I need to start with little words"   PERTINENT HISTORY: L-MCA 2014, dental procedure 11-19-2021 resulting in ongoing bleeding. Presented to ED 12-02-2021, imaging revealed acute on chronic SDH. While admitted began showing speech disturbances. Underwent L frontotemporal craniotomy for evacuation of hematoma 12-03-2021. Received ST services while admitted to inpatient rehab.  PAIN:  Are you having pain? No  FALLS: Has patient fallen in last 6 months?  No  PATIENT GOALS: "do how I did before"  OBJECTIVE:   PATIENT REPORTED OUTCOME MEASURES (PROM): Deferred d/t time, will complete initial therapy session  TODAY'S TREATMENT: 01-13-22: Target strengthening of phonemic to orthographic correspondence through use of Copy and Recall treatment, to aid in reading and spelling difficulties which are ongoing since more recent stroke. Generate core list of words to train frequent representations found when reading writing. Pt names 20/20 items with rare semantic cues. Initial attempt of spelling without support of SLP results in 15/20 targets spelled correctly. Able to generate accurate spelling of additional targets given mod-A from SLP  Following, with use of visual aid, is able to write all targets accurately x3 with no instances of SLP needing to cue for correction. Following SLP verbally dictated single syllable words, with pt able to spell 15/19 correctly with occasional min to mod-A from SLP. Updated HEP with spelling/reading practice with instruction to focus on saying work out loud and listening for sounds to A with spelling.   01-06-2022: Education provided on evaluation results and SLP's recommendations. Pt verbalizes agreement with POC, all questions answered to satisfaction.   PATIENT EDUCATION: Education details: see above Person educated: Patient Education method: Consulting civil engineer, Demonstration, Verbal cues, and Handouts Education comprehension: verbalized understanding, returned demonstration, and needs further education   GOALS: Goals reviewed with patient? Yes  SHORT TERM GOALS: Target date: 02-03-2022  Pt will successfully complete moderately complex language tasks in 90% of opportunities given occasional min-A over 2 sessions Baseline: Goal status: INITIAL  2.  Pt will write 18/20 trained words accurately with occasional min-A  Baseline:  Goal status: INITIAL  3.  During structured exercise, pt will successful demonstrate anomia strategy with rare min-A in 80% of trials  Baseline:  Goal status: INITIAL  4.  Pt will ID and attempt to correct paraphasia during structured activities in 50% of opportunities  Baseline:  Goal status: INITIAL   LONG TERM GOALS: Target date: 03-31-2022  Pt will complete HEP 5/7 days over 2 week period with min-A from family  Baseline:  Goal status: INITIAL  2.  Pt will report improvement via patient reported outcome measure by d/c Baseline: to be completed initial therapy session Goal status: INITIAL  3.  Pt will ID and attempt to correct paraphasia during structured activities in 80% of opportunities  Baseline:  Goal status: INITIAL  4.  Pt will use external  support to A in reading of information on phone with mod-I following direct instruction Baseline:  Goal status: INITIAL  5.  Pt will generate approximate spelling of untrained words, efficacy or which to be judged by SLP being able to determine target in 80% of trials  Baseline:  Goal status: INITIAL   ASSESSMENT:  CLINICAL IMPRESSION: Patient is a 67 y.o. F who was seen today for cognitive linguistic evaluation. Pt presents with moderate expressive aphasia c/b effortful speech with usual word finding, occasional paraphasias. Additional impairments of agraphia and alexia. Based on chart review, appears pt's aphasia may be close to baseline from 2014 stroke, however family is not present to confirm or provide nuanced information. Per CIR documentation, perseveration and semantic paraphasias are acute to current event. Pt endorses reading and writing deficits are newly presenting as well. Auditory comprehension relative strength and within gross functional limits per clinical observations and performance on QAB, with extended time and occasional repetitions. Verbal expression is notable  for frequent pausing d/t word finding deficits, agrammatism, and reduced complexity. Is able to express basic thoughts and ideas, to include asking question, with extended time. Requires some inference from SLP, with confirmation to ensure understanding, to fully appreciate pt's intended message. Simple, one to one phonemic to orthographic representation intact for reading/writing, though laborious. Pt would benefit from skilled ST to optimize communication efficacy, reduce frustration and caregiver burden.  OBJECTIVE IMPAIRMENTS: include memory, executive functioning, receptive language, aphasia, and apraxia. These impairments are limiting patient from managing medications, managing appointments, managing finances, household responsibilities, ADLs/IADLs, and effectively communicating at home and in community. Factors  affecting potential to achieve goals and functional outcome are previous level of function, severity of impairments, financial resources, and family/community support. Patient will benefit from skilled SLP services to address above impairments and improve overall function.  REHAB POTENTIAL: Fair (prior level of function s/p stroke 2014)  PLAN:  SLP FREQUENCY: 1-2x/week - SLP recommends 2x a week, pt elects to initiate therapy at 1x a week   SLP DURATION: 12 weeks  PLANNED INTERVENTIONS: Language facilitation, Cueing hierachy, Cognitive reorganization, Internal/external aids, Functional tasks, Multimodal communication approach, SLP instruction and feedback, Compensatory strategies, and Patient/family education  Su Monks, CCC-SLP 01/13/2022, 2:02 PM

## 2022-01-13 NOTE — Therapy (Signed)
OUTPATIENT PHYSICAL THERAPY NEURO TREATMENT   Patient Name: [name] MRN: [id number] DOB:04/06/1955, 67 y.o., female Today's Date: 01/13/2022   PCP: Lennie Odor, [place] REFERRING PROVIDER: Cathlyn Parsons, PA-C  END OF SESSION:  PT End of Session - 01/13/22 1317     Visit Number 2    Number of Visits 7    Date for PT Re-Evaluation 03/03/22    Authorization Type [employer]    PT Start Time 7001    PT Stop Time 7494    PT Time Calculation (min) 42 min    Equipment Utilized During Treatment Gait belt    Activity Tolerance Patient tolerated treatment well    Behavior During Therapy WFL for tasks assessed/performed             Past Medical History:  Diagnosis Date   Abnormal chest CT    Anemia    Aortic atherosclerosis (HCC)    Asthma    Atrial enlargement, left    severe   Atrial fibrillation ([place]) 01/05/2011   Chronic persistent, failed DCCV    Atrial flutter ([place])    Bronchospasm 1998   Cardiomegaly    Carotid bruit    Chronic diastolic congestive heart failure ([place])    Diabetes mellitus    Type II   Ejection fraction < 50%    35-40%,    Heart murmur    History of blood transfusion    History of radiation therapy 03/18/19-04/15/19   endometrial - vaginal brachytherapy -  Dr. Sondra Come    Hypercholesterolemia    Hypertension    Junctional rhythm 09/14/2018   Noted on EKG   Left bundle branch block (LBBB) 09/14/2018   Noted on EKG   LVH (left ventricular hypertrophy) 10/10/2016   Moderate, Noted on ECHO   Mitral regurgitation    Obesity    Obesity (BMI 30-39.9) 01/16/2012   Persistent atrial fibrillation ([place])    Polyp of rectum    Rheumatic fever 01/16/2012   Reported during childhood   S/P Maze operation for atrial fibrillation 02/15/2012   Complete biatrial lesion set using bipolar radiofrequency and cryothermy ablation with clipping of LA appendage   S/P mitral valve replacement with metallic valve 49/67/5916   7m Sorin [employer]  Optiform mechanical prosthesis   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   239mEdwards mc3 ring annuloplasty   Shortness of breath    with exertion   Sleep apnea    DOES NOT HAVE CPAP   [place]   Post op , left arm weakness   Tricuspid regurgitation 01/16/2012   [facility]   Past Surgical History:  Procedure Laterality Date   CARDIAC CATHETERIZATION  >5 years   CARDIOVASCULAR STRESS TEST  09/2010   CARDIOVERSION  03/25/2011   Procedure: CARDIOVERSION;  Surgeon: JaJettie BoozeMD;  [facility] Service: Cardiovascular;  Laterality: N/A;   CARDIOVERSION N/A 07/27/2012   Procedure: CARDIOVERSION;  Surgeon: JaJettie BoozeMD;  [facility] Service: Cardiovascular;  Laterality: N/A;   CESAREAN SECTION     COLONOSCOPY WITH PROPOFOL N/A 04/27/2016   Procedure: COLONOSCOPY WITH PROPOFOL;  Surgeon: ViWilford CornerMD;  [facility];  Service: Endoscopy;  Laterality: N/A;   CRANIOTOMY Left 12/03/2021   Procedure: FRONTOTEMPORAL CRANIOTOMY FOR EVACUATION SUBDURAL HEMATOMA;  Surgeon: DaKarsten RoDO;  [facility] Service: Neurosurgery;  Laterality: Left;   INTRAOPERATIVE TRANSESOPHAGEAL ECHOCARDIOGRAM  02/15/2012   Procedure: INTRAOPERATIVE TRANSESOPHAGEAL ECHOCARDIOGRAM;  Surgeon: Rexene Alberts, MD;  Location: [place];  Service: Open Heart Surgery;  Laterality: N/A;   MAZE  02/15/2012   Procedure: MAZE;  Surgeon: Rexene Alberts, MD;  Location: [place];  Service: Open Heart Surgery;  Laterality: N/A;   MITRAL VALVE REPLACEMENT  02/15/2012   Procedure: MITRAL VALVE (MV) REPLACEMENT;  Surgeon: Rexene Alberts, MD;  Location: [place];  Service: Open Heart Surgery;  Laterality: N/A;   ROBOTIC ASSISTED LAPAROSCOPIC HYSTERECTOMY AND SALPINGECTOMY Bilateral 01/02/2019   Procedure: XI ROBOTIC ASSISTED LAPAROSCOPIC TOTAL HYSTERECTOMY WITH BILATERAL SALPINGOOPHORECTOMY, SENTINEL LYMPH NODE BIOPSY;  Surgeon: [name], MD;  [facility];  Service: Gynecology;  Laterality: Bilateral;   TEE WITHOUT CARDIOVERSION  12/21/2011   Procedure: TRANSESOPHAGEAL ECHOCARDIOGRAM (TEE);  Surgeon: Jettie Booze, MD;  Location: [place];  Service: Cardiovascular;  Laterality: N/A;   TOOTH EXTRACTION N/A 11/19/2021   Procedure: DENTAL EXTRACTION TEETH NUMBER 14, 19, 30, 31;  Surgeon: Diona Browner, DMD;  Location: [place];  Service: Oral Surgery;  Laterality: N/A;   TRICUSPID VALVE REPLACEMENT  02/15/2012   Procedure: TRICUSPID VALVE REPAIR;  Surgeon: Rexene Alberts, MD;  Location: [place];  Service: Open Heart Surgery;  Laterality: N/A;   TUBAL LIGATION     at time of her c-section   Patient Active Problem List   Diagnosis Date Noted   Hypertension associated with diabetes ([place]) 12/08/2021   Anticoagulated on warfarin 12/07/2021   Acute subdural hematoma ([place]) 12/02/2021   Chronic anticoagulation - on coumadin for mechanical mitral valve, afib 12/02/2021   Subdural hematoma, nontraumatic ([place]) 12/02/2021   Pancytopenia, acquired ([place]) 06/21/2019   Peripheral neuropathy due to chemotherapy ([place]) 06/21/2019   Anemia, chronic disease 05/21/2019   Drug-induced hyperglycemia 02/25/2019   Stage 3a chronic kidney disease (CKD) ([place]) 02/04/2019   Iron deficiency anemia 01/18/2019   Endometrial cancer ([place]) 01/02/2019   Uterine cancer ([place]) 12/27/2018   Special screening for malignant neoplasms, colon 04/27/2016   LBBB (left bundle branch block) 10/07/2013   Encounter for therapeutic drug monitoring 03/19/2013   Mitral valve disorders(424.0) 10/12/2012   Heart valve replaced by other means 10/12/2012   Atrial flutter ([place])    CVA (cerebral infarction) 04/14/2012   S/P TVR (tricuspid valve repair) 03/19/2012   S/P MVR (mitral valve replacement) 03/19/2012   History of mitral valve replacement with mechanical valve 02/15/2012   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   S/P Maze operation for atrial fibrillation 02/15/2012   CHF  (congestive heart failure) ([place]) 02/06/2012   MR (mitral regurgitation) 02/06/2012   Tricuspid regurgitation 01/16/2012   Rheumatic fever 01/16/2012   Obesity (BMI 30-39.9) 01/16/2012   Mitral regurgitation    Chronic diastolic CHF (congestive heart failure) ([place]) 12/19/2011   Snoring 02/28/2011   Atrial fibrillation ([place]) 34/74/2595   Chronic systolic dysfunction of left ventricle 01/05/2011   Hypertension 10/18/2010   Type 2 diabetes mellitus ([place]) 10/18/2010   Hypercholesterolemia 10/18/2010   Mediastinal lymphadenopathy 10/18/2010   Asthma 10/18/2010    ONSET DATE: 12/15/2021 (referral date)  REFERRING DIAG: S06.5XAA (ICD-10-CM) - SDH (subdural hematoma) (HCC)  THERAPY DIAG:  Unsteadiness on feet  Abnormality of gait and mobility  Muscle weakness (generalized)  Rationale for Evaluation and Treatment: Rehabilitation  SUBJECTIVE:  SUBJECTIVE STATEMENT:  Patient reports for first follow up since initial eval. She reports that she doing alright. Patient reports that she wasn't sure she needed physical therapy but then decided she did based on her knee felt. She denies pain. Denies falls since last visit. Patient says she has safe places to walk in neighborhood and at a school.  Pt accompanied by: self  PERTINENT HISTORY:   Per Note on 12/10/2021:  "Brief HPI:   [name] is a 67 y.o. female with history of CHF, OSA, CKD, MVR-chronic Coumadin, left MCA stroke 2014, recent dental extraction who was evaluated in the ED 12/02/21 due to issues with ongoing bleeding since her procedure.  She will did report headaches which was followed by somnolence with confusion and CT of head done showing acute on chronic SDH with 16 mm shift to the left, 8 mm left to right midline shift and effacement of  left greater than right psych eye.  INR at admission was 2.4 and was reversed with Kcentra.  Dr. Reatha Armour was consulted and she underwent left frontotemporal craniotomy for evacuation of hematoma on 11/24.  She was started on Keppra for seizure prophylaxis and cleared to start IV heparin on 11/27.  Recommendations were to repeat CT once heparin was therapeutic prior to transition to oral [facility]."  Patient transferred to [place] on 12/10/2021 per notes and saw all three disciplines. Finished skilled physical therapy in that setting on 12/15/2021.    PAIN:  Are you having pain? No  PRECAUTIONS: Fall and ICD/Pacemaker  WEIGHT BEARING RESTRICTIONS: No  FALLS: Has patient fallen in last 6 months?  Patient reports no falls in last 6 months  PATIENT GOALS: "I want my speech to be better. My family want me to work on my safety."  OBJECTIVE:   DIAGNOSTIC FINDINGS:  12/08/2021 CT Head w/o Contrast: "IMPRESSION: 1. Mixed density subdural hematoma on the left is unchanged. Mass-effect with 4 mm midline shift to the right unchanged. 2. Small right temporal subdural hematoma unchanged. 3. No new area of hemorrhage."  Vitals:   01/13/22 1325  BP: 125/64  Pulse: 61   TODAY'S TREATMENT:                                                                                                                               Therex:  Sit to stands without UE support and eccentric lower at low mat 2 x 10  3 way kicks with leg straight and UE support at counter 3 x 10 (SBA)  NMR:  Corner balance EC with head turns vertical / horizontal 3 x 10 reps (SBA)  Corner balance EO semitandem 2 x 30" performed bilaterally (SBA)   There Act: Stair climb step through with bilateral rail use for ascent, and step to with bilateral hand rail use on descent 4 steps x 2  Ramp navigation without AD with SBA x 1  PATIENT EDUCATION: Education details: Initial  HEP + [employer] program +  Diplomatic Services operational officer Person educated: Patient Education method: Theatre stage manager Education comprehension: verbalized understanding and needs further education  HOME EXERCISE PROGRAM: Access Code: [email or web address] URL: [email or web address] Date: 01/13/2022 Prepared by: Malachi Carl  Exercises - Sit to Stand Without Arm Support  - 1 x daily - 7 x weekly - 3 sets - 5 reps - Corner Balance Feet Together: Eyes Closed With Head Turns  - 1 x daily - 7 x weekly - 3 sets - 10 reps - Standing 3-Way Kick  - 1 x daily - 7 x weekly - 3 sets - 10 reps - Standing Marching  - 1 x daily - 7 x weekly - 3 sets - 10 reps  KB Home	[place] 6 Week Beginner Walking Program - pdf provided  GOALS: Goals reviewed with patient? Yes  LONG TERM GOALS: Target date: 03/03/2022 (LTG only given length of POC)  Patient will report demonstrate independence with final HEP in order to maintain current gains and continue to progress after physical therapy discharge.   Baseline: To be provided Goal status: INITIAL  2.  Patient will improve gait speed to 1.1 m/s or greater with LRAD to indicate a reduced risk for falls.   Baseline: 0.81 m/s Goal status: INITIAL  3.  Patient will improve their 5x Sit to Stand score to less than 15 seconds to demonstrate a decreased risk for falls and improved LE strength.   Baseline: 21.76" with decreased eccentric control to sit and mild LOB on final sit to stand and able to recover independently Goal status: INITIAL  4.  Patient will improve ABC Score to 67% or greater to indicate a decreased risk for falls and improved self-reported confidence in balance and sense of steadiness.   Baseline: 61.3% Goal status: INITIAL  5.  Patient will improve 2MWT with LRAD to 155.2 m or greater to achieve age reported norms and demonstrate improved cardiorespiratory endurance and mobility. Baseline: 89.82mGoal status: INITIAL   ASSESSMENT:  CLINICAL IMPRESSION: Session  emphasized creation of initial HEP. Patient tolerated session well and reported that she thought the home program would be possible to complete. Patient demonstrated proper safety awareness at end of session with HEP. Patient required min verbal cues to maintain straight leg during kick outs but improved with repetition. Patient will benefit from skilled physical therapy services to address these impairments and improve overall function.    OBJECTIVE IMPAIRMENTS: Abnormal gait, decreased balance, decreased knowledge of use of DME, decreased mobility, difficulty walking, decreased strength, decreased safety awareness, impaired sensation, and impaired tone.   ACTIVITY LIMITATIONS: standing, squatting, and caring for others  PARTICIPATION LIMITATIONS: driving and community activity  PERSONAL FACTORS: Age, Past/current experiences, and 1-2 comorbidities: see above  are also affecting patient's functional outcome.   REHAB POTENTIAL: Good  CLINICAL DECISION MAKING: Evolving/moderate complexity  EVALUATION COMPLEXITY: Moderate  PLAN:  PT FREQUENCY: 1x/week  PT DURATION: 8 weeks  PLANNED INTERVENTIONS: Therapeutic exercises, Therapeutic activity, Neuromuscular re-education, Gait training, Self Care, and Manual therapy  PLAN FOR NEXT SESSION: Dynamic gait tasks, sit to stands for speed, treadmill and increased gait speed, review HEP + walking program progress  SMalachi Carl PT, DPT  01/13/2022, 2:34 PM

## 2022-01-14 ENCOUNTER — Ambulatory Visit: Payer: Medicare HMO | Attending: Cardiology

## 2022-01-14 ENCOUNTER — Other Ambulatory Visit: Payer: Self-pay | Admitting: Physical Medicine and Rehabilitation

## 2022-01-14 DIAGNOSIS — Z954 Presence of other heart-valve replacement: Secondary | ICD-10-CM | POA: Diagnosis not present

## 2022-01-14 DIAGNOSIS — I480 Paroxysmal atrial fibrillation: Secondary | ICD-10-CM | POA: Diagnosis not present

## 2022-01-14 DIAGNOSIS — Z5181 Encounter for therapeutic drug level monitoring: Secondary | ICD-10-CM

## 2022-01-14 LAB — POCT INR: INR: 2.1 (ref 2.0–3.0)

## 2022-01-14 NOTE — Patient Instructions (Signed)
TAKE 2 TABLETS TODAY ONLY THEN INCREASE TO 1.5 tablets Daily, EXCEPT 2 TABLETS ON WEDNESDAYS. Stay consistent with greens each week (2-3 times per week).  Recheck INR in 2 weeks. Call coumadin clinic for any changes in medications or up coming procedures. [phone].

## 2022-01-17 ENCOUNTER — Telehealth: Payer: Self-pay | Admitting: *Deleted

## 2022-01-17 ENCOUNTER — Telehealth: Payer: Self-pay

## 2022-01-17 DIAGNOSIS — R0902 Hypoxemia: Secondary | ICD-10-CM | POA: Diagnosis not present

## 2022-01-17 DIAGNOSIS — R29810 Facial weakness: Secondary | ICD-10-CM | POA: Diagnosis not present

## 2022-01-17 DIAGNOSIS — W19XXXA Unspecified fall, initial encounter: Secondary | ICD-10-CM | POA: Diagnosis not present

## 2022-01-17 NOTE — Telephone Encounter (Signed)
CALLED PATIENT TO INFORM OF FU APPT. WITH DR. Berline Lopes ON 04-07-22 - ARRIVAL TIME- 1:30 PM, SPOKE WITH PATIENT'S DAUGHTER- TOMEKA AND SHE IS AWARE OF THIS APPT.

## 2022-01-17 NOTE — Telephone Encounter (Signed)
Enid Derry from ([facility]) called office.  Pt is scheduled for a follow up on 04/07/22  at 1:30 with Dr. Berline Lopes.  Enid Derry to notify pt of appointment date and time.

## 2022-01-18 ENCOUNTER — Encounter (HOSPITAL_COMMUNITY): Payer: Self-pay | Admitting: Emergency Medicine

## 2022-01-18 ENCOUNTER — Other Ambulatory Visit: Payer: Self-pay

## 2022-01-18 ENCOUNTER — Emergency Department (HOSPITAL_COMMUNITY): Payer: Medicare HMO

## 2022-01-18 ENCOUNTER — Emergency Department (HOSPITAL_COMMUNITY)
Admission: EM | Admit: 2022-01-18 | Discharge: 2022-01-18 | Disposition: A | Discharge status: Home / Self Care | Payer: Medicare HMO | Attending: Emergency Medicine | Admitting: Emergency Medicine

## 2022-01-18 DIAGNOSIS — I5032 Chronic diastolic (congestive) heart failure: Secondary | ICD-10-CM | POA: Diagnosis not present

## 2022-01-18 DIAGNOSIS — U071 COVID-19: Secondary | ICD-10-CM | POA: Diagnosis not present

## 2022-01-18 DIAGNOSIS — G9389 Other specified disorders of brain: Secondary | ICD-10-CM | POA: Diagnosis not present

## 2022-01-18 DIAGNOSIS — Z8673 Personal history of transient ischemic attack (TIA), and cerebral infarction without residual deficits: Secondary | ICD-10-CM | POA: Insufficient documentation

## 2022-01-18 DIAGNOSIS — I4891 Unspecified atrial fibrillation: Secondary | ICD-10-CM | POA: Insufficient documentation

## 2022-01-18 DIAGNOSIS — G510 Bell's palsy: Secondary | ICD-10-CM | POA: Diagnosis not present

## 2022-01-18 DIAGNOSIS — N3000 Acute cystitis without hematuria: Secondary | ICD-10-CM

## 2022-01-18 DIAGNOSIS — I110 Hypertensive heart disease with heart failure: Secondary | ICD-10-CM | POA: Insufficient documentation

## 2022-01-18 DIAGNOSIS — Z7901 Long term (current) use of anticoagulants: Secondary | ICD-10-CM | POA: Insufficient documentation

## 2022-01-18 DIAGNOSIS — E119 Type 2 diabetes mellitus without complications: Secondary | ICD-10-CM | POA: Insufficient documentation

## 2022-01-18 DIAGNOSIS — R0902 Hypoxemia: Secondary | ICD-10-CM | POA: Diagnosis not present

## 2022-01-18 DIAGNOSIS — I6203 Nontraumatic chronic subdural hemorrhage: Secondary | ICD-10-CM | POA: Diagnosis not present

## 2022-01-18 DIAGNOSIS — J45909 Unspecified asthma, uncomplicated: Secondary | ICD-10-CM | POA: Diagnosis not present

## 2022-01-18 DIAGNOSIS — R29810 Facial weakness: Secondary | ICD-10-CM | POA: Diagnosis not present

## 2022-01-18 DIAGNOSIS — Z7984 Long term (current) use of oral hypoglycemic drugs: Secondary | ICD-10-CM | POA: Insufficient documentation

## 2022-01-18 DIAGNOSIS — I10 Essential (primary) hypertension: Secondary | ICD-10-CM | POA: Diagnosis not present

## 2022-01-18 DIAGNOSIS — Z79899 Other long term (current) drug therapy: Secondary | ICD-10-CM | POA: Insufficient documentation

## 2022-01-18 DIAGNOSIS — W19XXXA Unspecified fall, initial encounter: Secondary | ICD-10-CM

## 2022-01-18 DIAGNOSIS — G238 Other specified degenerative diseases of basal ganglia: Secondary | ICD-10-CM | POA: Diagnosis not present

## 2022-01-18 LAB — URINALYSIS, ROUTINE W REFLEX MICROSCOPIC
Bilirubin Urine: NEGATIVE
Glucose, UA: NEGATIVE mg/dL
Hgb urine dipstick: NEGATIVE
Ketones, ur: 5 mg/dL — AB
Nitrite: NEGATIVE
Protein, ur: 30 mg/dL — AB
Specific Gravity, Urine: 1.019 (ref 1.005–1.030)
WBC, UA: >50 WBC/hpf — ABNORMAL HIGH (ref 0–5)
pH: 5 (ref 5.0–8.0)

## 2022-01-18 LAB — DIFFERENTIAL
Abs Immature Granulocytes: 0.02 10*3/uL (ref 0.00–0.07)
Basophils Absolute: 0 10*3/uL (ref 0.0–0.1)
Basophils Relative: 0 %
Eosinophils Absolute: 0 10*3/uL (ref 0.0–0.5)
Eosinophils Relative: 0 %
Immature Granulocytes: 0 %
Lymphocytes Relative: 17 %
Lymphs Abs: 0.9 10*3/uL (ref 0.7–4.0)
Monocytes Absolute: 0.8 10*3/uL (ref 0.1–1.0)
Monocytes Relative: 15 %
Neutro Abs: 3.6 10*3/uL (ref 1.7–7.7)
Neutrophils Relative %: 68 %

## 2022-01-18 LAB — RESP PANEL BY RT-PCR (RSV, FLU A&B, COVID)  RVPGX2
Influenza A by PCR: NEGATIVE
Influenza B by PCR: NEGATIVE
Resp Syncytial Virus by PCR: NEGATIVE
SARS Coronavirus 2 by RT PCR: POSITIVE — AB

## 2022-01-18 LAB — I-STAT CHEM 8, ED
BUN: 17 mg/dL (ref 8–23)
Calcium, Ion: 1.19 mmol/L (ref 1.15–1.40)
Chloride: 101 mmol/L (ref 98–111)
Creatinine, Ser: 1 mg/dL (ref 0.44–1.00)
Glucose, Bld: 144 mg/dL — ABNORMAL HIGH (ref 70–99)
HCT: 33 % — ABNORMAL LOW (ref 36.0–46.0)
Hemoglobin: 11.2 g/dL — ABNORMAL LOW (ref 12.0–15.0)
Potassium: 4.1 mmol/L (ref 3.5–5.1)
Sodium: 139 mmol/L (ref 135–145)
TCO2: 24 mmol/L (ref 22–32)

## 2022-01-18 LAB — COMPREHENSIVE METABOLIC PANEL
ALT: 22 U/L (ref 0–44)
AST: 31 U/L (ref 15–41)
Albumin: 3.6 g/dL (ref 3.5–5.0)
Alkaline Phosphatase: 56 U/L (ref 38–126)
Anion gap: 10 (ref 5–15)
BUN: 17 mg/dL (ref 8–23)
CO2: 24 mmol/L (ref 22–32)
Calcium: 8.7 mg/dL — ABNORMAL LOW (ref 8.9–10.3)
Chloride: 101 mmol/L (ref 98–111)
Creatinine, Ser: 1.11 mg/dL — ABNORMAL HIGH (ref 0.44–1.00)
GFR, Estimated: 54 mL/min — ABNORMAL LOW (ref >60)
Glucose, Bld: 144 mg/dL — ABNORMAL HIGH (ref 70–99)
Potassium: 3.9 mmol/L (ref 3.5–5.1)
Sodium: 135 mmol/L (ref 135–145)
Total Bilirubin: 1 mg/dL (ref 0.3–1.2)
Total Protein: 7.4 g/dL (ref 6.5–8.1)

## 2022-01-18 LAB — ETHANOL: Alcohol, Ethyl (B): <10 mg/dL (ref <10)

## 2022-01-18 LAB — CBC
HCT: 31.4 % — ABNORMAL LOW (ref 36.0–46.0)
Hemoglobin: 10.1 g/dL — ABNORMAL LOW (ref 12.0–15.0)
MCH: 31.6 pg (ref 26.0–34.0)
MCHC: 32.2 g/dL (ref 30.0–36.0)
MCV: 98.1 fL (ref 80.0–100.0)
Platelets: 121 10*3/uL — ABNORMAL LOW (ref 150–400)
RBC: 3.2 MIL/uL — ABNORMAL LOW (ref 3.87–5.11)
RDW: 13.1 % (ref 11.5–15.5)
WBC: 5.3 10*3/uL (ref 4.0–10.5)
nRBC: 0 % (ref 0.0–0.2)

## 2022-01-18 LAB — PROTIME-INR
INR: 1.7 — ABNORMAL HIGH (ref 0.8–1.2)
Prothrombin Time: 19.4 seconds — ABNORMAL HIGH (ref 11.4–15.2)

## 2022-01-18 LAB — APTT: aPTT: 42 seconds — ABNORMAL HIGH (ref 24–36)

## 2022-01-18 LAB — CBG MONITORING, ED: Glucose-Capillary: 134 mg/dL — ABNORMAL HIGH (ref 70–99)

## 2022-01-18 MED ORDER — CEPHALEXIN 500 MG PO CAPS: 500.0000 mg | ORAL_CAPSULE | Freq: Three times a day (TID) | ORAL | 0 refills | Status: DC

## 2022-01-18 MED ORDER — NIRMATRELVIR/RITONAVIR (PAXLOVID) TABLET (RENAL DOSING): 2.0000 | ORAL_TABLET | Freq: Two times a day (BID) | ORAL | 0 refills | Status: AC

## 2022-01-18 MED ORDER — SODIUM CHLORIDE 0.9 % IV SOLN
1.0000 g | Freq: Once | INTRAVENOUS | Status: AC
  Administered 2022-01-18: 1 g via INTRAVENOUS
  Filled 2022-01-18: qty 10

## 2022-01-18 NOTE — ED Triage Notes (Addendum)
Pt via EMS from home reporting new left-sided facial droop since 1pm and a fall at home around 5pm. Hx prior CVA with left deficit addressed in PT but currently no residual weakness or deficit per family report. FAST ED scale 1. Pt + blood thinner, not sure which one but believes it is Coumadin with recent INR 2.1. No visible injuries or pain. 88% oxygen saturation on scene; EMS applied 2L nasal cannula. Pt denies SOB. A/O x 4, ambulatory, able to sign, no speech deficit but pt seems to feel unwell.    BP 130/80 HR 87 O2 96% 2L, no baseline requirement CBG 144

## 2022-01-18 NOTE — Discharge Instructions (Signed)
You were seen today after a fall.  Your imaging is reassuring and improved from prior.  However, you have evidence of both a UTI and you tested positive for COVID-19.  Take medications as prescribed.  Any new or worsening symptoms, you should be reevaluated.

## 2022-01-18 NOTE — ED Notes (Signed)
Daughter Beau Fanny [phone] would like an update asap

## 2022-01-18 NOTE — ED Provider Notes (Signed)
[facility] EMERGENCY DEPARTMENT Provider Note   CSN: [id number] Arrival date & time: 01/18/22  0040     History  Chief Complaint  Patient presents with   [name]    [name] is a 67 y.o. female.  HPI     This is a 67 year old female with history of CVA who presents with fall from home.  Per EMS report, family reported some left-sided facial droop since 1 PM.  She fell around 5.  Patient states that "I missed the chair in my room."  She does not have any complaints at this time.  No headache.  She denies hitting her head or loss of consciousness.  Per EMS no residual deficits noted on exam.  She does take Coumadin.  They did note that her oxygen levels were 88%.  She denies shortness of breath or chest pain.  No recent illnesses or fevers.  Home Medications Prior to Admission medications   Medication Sig Start Date End Date Taking? Authorizing Provider  cephALEXin (KEFLEX) 500 MG capsule Take 1 capsule (500 mg total) by mouth 3 (three) times daily. 01/18/22  Yes Kaymen Adrian, Barbette Hair, MD  nirmatrelvir/ritonavir, renal dosing, (PAXLOVID) 10 x 150 MG & 10 x '100MG'$  TABS Take 2 tablets by mouth 2 (two) times daily for 5 days. Patient GFR is 54.  Take nirmatrelvir (150 mg) one tablet twice daily for 5 days and ritonavir (100 mg) one tablet twice daily for 5 days. 01/18/22 01/23/22 Yes Gary Gabrielsen, Barbette Hair, MD  acetaminophen (TYLENOL) 325 MG tablet Take 1-2 tablets (325-650 mg total) by mouth every 4 (four) hours as needed for mild pain. 12/15/21   Love, Ivan Anchors, PA-C  amiodarone (PACERONE) 200 MG tablet TAKE 1 TABLET(200 MG) BY MOUTH DAILY 12/15/21   Love, Ivan Anchors, PA-C  amLODipine (NORVASC) 10 MG tablet TAKE 1 TABLET EVERY DAY 12/09/21   Jettie Booze, MD  atorvastatin (LIPITOR) 40 MG tablet TAKE 1 TABLET(40 MG) BY MOUTH DAILY Patient taking differently: Take 40 mg by mouth daily. 11/24/20   Jettie Booze, MD  carvedilol (COREG) 12.5 MG tablet Take 1 tablet (12.5 mg  total) by mouth 2 (two) times daily with a meal. 12/15/21   Love, Ivan Anchors, PA-C  docusate sodium (COLACE) 100 MG capsule Take 1 capsule (100 mg total) by mouth 2 (two) times daily. 12/16/21   Love, Ivan Anchors, PA-C  irbesartan (AVAPRO) 300 MG tablet TAKE 1 TABLET EVERY DAY 12/09/21   Jettie Booze, MD  levETIRAcetam (KEPPRA) 500 MG tablet Take 1 tablet (500 mg total) by mouth 2 (two) times daily. 12/15/21   Love, Ivan Anchors, PA-C  metFORMIN (GLUCOPHAGE) 500 MG tablet Take 0.5 tablets (250 mg total) by mouth 2 (two) times daily with a meal. 12/16/21   Love, Ivan Anchors, PA-C  pantoprazole (PROTONIX) 40 MG tablet Take 1 tablet (40 mg total) by mouth daily. 12/15/21   Love, Ivan Anchors, PA-C  polyethylene glycol powder (GLYCOLAX/MIRALAX) 17 GM/SCOOP powder Take 17 g by mouth daily. 12/15/21   Love, Ivan Anchors, PA-C  warfarin (COUMADIN) 4 MG tablet TAKE 1 AND 1/2 TABLETS with supper starting Friday. Appointment with coumadin clinic on Monday for further instructions. 12/16/21   Love, Ivan Anchors, PA-C      Allergies    Chlorhexidine    Review of Systems   Review of Systems  Constitutional:  Negative for fever.  Neurological:  Positive for facial asymmetry.  All other systems reviewed and are negative.  Physical Exam Updated Vital Signs BP (!) 121/47 (BP Location: Right Arm)   Pulse 73   Temp 99.8 F (37.7 C) (Oral)   Resp 17   Ht 1.676 m ('5\' 6"'$ )   Wt 97.5 kg   SpO2 95%   BMI 34.70 kg/m  Physical Exam Vitals and nursing note reviewed.  Constitutional:      Appearance: She is well-developed. She is not ill-appearing.  HENT:     Head: Normocephalic and atraumatic.  Eyes:     Pupils: Pupils are equal, round, and reactive to light.  Cardiovascular:     Rate and Rhythm: Normal rate and regular rhythm.     Heart sounds: Normal heart sounds.  Pulmonary:     Effort: Pulmonary effort is normal. No respiratory distress.     Breath sounds: No wheezing.     Comments: Nasal cannula in place, no  respiratory distress Abdominal:     Palpations: Abdomen is soft.     Tenderness: There is no abdominal tenderness.  Musculoskeletal:     Cervical back: Neck supple.  Skin:    General: Skin is warm and dry.  Neurological:     Mental Status: She is alert and oriented to person, place, and time.     Cranial Nerves: No cranial nerve deficit.     Comments: CN 2-12 intact, no droop, fluid speech, 5/5 strength in all 4 extremities     ED Results / Procedures / Treatments   Labs (all labs ordered are listed, but only abnormal results are displayed) Labs Reviewed  RESP PANEL BY RT-PCR (RSV, FLU A&B, COVID)  RVPGX2 - Abnormal; Notable for the following components:      Result Value   SARS Coronavirus 2 by RT PCR POSITIVE (*)    All other components within normal limits  PROTIME-INR - Abnormal; Notable for the following components:   Prothrombin Time 19.4 (*)    INR 1.7 (*)    All other components within normal limits  APTT - Abnormal; Notable for the following components:   aPTT 42 (*)    All other components within normal limits  CBC - Abnormal; Notable for the following components:   RBC 3.20 (*)    Hemoglobin 10.1 (*)    HCT 31.4 (*)    Platelets 121 (*)    All other components within normal limits  COMPREHENSIVE METABOLIC PANEL - Abnormal; Notable for the following components:   Glucose, Bld 144 (*)    Creatinine, Ser 1.11 (*)    Calcium 8.7 (*)    GFR, Estimated 54 (*)    All other components within normal limits  URINALYSIS, ROUTINE W REFLEX MICROSCOPIC - Abnormal; Notable for the following components:   APPearance HAZY (*)    Ketones, ur 5 (*)    Protein, ur 30 (*)    Leukocytes,Ua MODERATE (*)    WBC, UA >50 (*)    Bacteria, UA FEW (*)    All other components within normal limits  I-STAT CHEM 8, ED - Abnormal; Notable for the following components:   Glucose, Bld 144 (*)    Hemoglobin 11.2 (*)    HCT 33.0 (*)    All other components within normal limits  CBG  MONITORING, ED - Abnormal; Notable for the following components:   Glucose-Capillary 134 (*)    All other components within normal limits  URINE CULTURE  DIFFERENTIAL  ETHANOL    EKG EKG Interpretation  Date/Time:  Tuesday January 18 2022 00:41:49 EST Ventricular  Rate:  78 PR Interval:  200 QRS Duration: 156 QT Interval:  466 QTC Calculation: 531 R Axis:   -26 Text Interpretation: Normal sinus rhythm Left bundle branch block Abnormal ECG When compared with ECG of 02-Dec-2021 09:26, PREVIOUS ECG IS PRESENT Confirmed by Thayer Jew [phone]) on 01/18/2022 7:03:49 AM  Radiology MR BRAIN WO CONTRAST  Result Date: 01/18/2022 CLINICAL DATA:  67 year old female with new left side facial droop yesterday, fall. Previous left side craniotomy. EXAM: MRI HEAD WITHOUT CONTRAST TECHNIQUE: Multiplanar, multiecho pulse sequences of the brain and surrounding structures were obtained without intravenous contrast. COMPARISON:  Head CT 0154 hours today. Brain MRI 04/15/2012. FINDINGS: Brain: Diffusion heterogeneity in association with a small 3 mm left side subdural hematoma (series 9, image 17). This was visible by CT today. Overlying craniotomy. No convincing parenchymal diffusion restriction to suggest acute infarction. Superimposed trace right posterior convexity subdural blood also (series 5, image 94 and series 9, image 18). And probable trace para falcine subdural blood is well most conspicuous on SWI. No midline shift or significant intracranial mass effect. Basilar cisterns remain patent. No other active intracranial hemorrhage suspected. Occasional chronic microhemorrhages in the brain (series 14, image 36 left frontal lobe). No IVH or ventriculomegaly. Negative pituitary and cervicomedullary junction. Patchy and confluent cerebral white matter T2 and FLAIR hyperintensity greater in the left hemisphere. No definite cortical encephalomalacia. Small chronic lacunar infarct of the right caudate. And  perhaps subtle chronic linear lacunar infarcts of the cerebellum on series 11, image 9. Brainstem appears negative. Vascular: Major intracranial vascular flow voids are stable since 2014. Skull and upper cervical spine: Previous left frontotemporal craniotomy. Visualized bone marrow signal is within normal limits. Grossly negative visible cervical spine. Sinuses/Orbits: Mildly Disconjugate gaze. Otherwise negative orbits. Paranasal sinuses and mastoids are stable and well aerated. Other: Grossly normal visible internal auditory structures. No acute scalp soft tissue finding identified. IMPRESSION: 1. No acute infarct identified. 2. Small Left (3 mm) and trace Right side Subdural Hematomas. No significant intracranial mass effect. Previous left side craniotomy. 3. Mild to moderate for age underlying chronic small vessel disease, including occasional chronic micro hemorrhages. Electronically Signed   By: Genevie Ann M.D.   On: 01/18/2022 04:41   DG Chest 1 View  Result Date: 01/18/2022 CLINICAL DATA:  Hypoxia. EXAM: CHEST  1 VIEW COMPARISON:  July 29, 2016 FINDINGS: Multiple sternal wires are noted. The cardiac silhouette is enlarged and unchanged in size. An artificial cardiac valve is seen. A radiopaque left atrial appendage clip is noted. Mild to moderate severity diffuse interstitial prominence is seen without evidence of focal consolidation, pleural effusion or pneumothorax. The visualized skeletal structures are unremarkable. IMPRESSION: Stable cardiomegaly with mild to moderate severity interstitial edema. Electronically Signed   By: Virgina Norfolk M.D.   On: 01/18/2022 02:23   CT HEAD WO CONTRAST  Result Date: 01/18/2022 CLINICAL DATA:  Facial paralysis. EXAM: CT HEAD WITHOUT CONTRAST TECHNIQUE: Contiguous axial images were obtained from the base of the skull through the vertex without intravenous contrast. RADIATION DOSE REDUCTION: This exam was performed according to the departmental dose-optimization  program which includes automated exposure control, adjustment of the mA and/or kV according to patient size and/or use of iterative reconstruction technique. COMPARISON:  Head CT 12/08/2021. FINDINGS: Brain: Prior study demonstrated a 1 cm in thickness heterogeneous left frontoparietal subdural hematoma, underlying a left frontal craniotomy flap. 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 Previously there was a small right temporal subdural hematoma which has cleared. There was previously 4 mm left-to-right midline shift which is not seen any longer. There is mild cerebral atrophy, small-vessel disease and atrophic ventriculomegaly with senescent calcifications in the basal ganglia. No acute cortical based infarct, acute hemorrhage or mass effect are seen. Basal cisterns are clear. There is slight cerebellar atrophy. Vascular: The carotid siphons are moderately calcified. No hyperdense central vessel is seen. Skull: Unchanged left frontal craniotomy. No depressed skull fractures or focal lesions. Sinuses/Orbits: Unremarkable orbital contents. There is mild membrane thickening in the sinuses without fluid levels mastoid effusions. The nasal septum deviates slightly to the left. Other: None. IMPRESSION: 1. No acute intracranial CT findings. 2. 4 mm chronic-appearing left frontal subdural hematoma underlying the craniotomy, previously 1 cm in thickness. 3. Resolved right temporal subdural hematoma, with resolution of left-to-right midline shift. 4. Atrophy and small-vessel disease. 5. Sinus membrane disease. Electronically Signed   By: Telford Nab M.D.   On: 01/18/2022 02:10    Procedures Procedures    Medications Ordered in ED Medications  cefTRIAXone (ROCEPHIN) 1 g in sodium chloride 0.9 % 100 mL IVPB (0 g Intravenous Stopped 01/18/22 0536)    ED  Course/ Medical Decision Making/ A&P Clinical Course as of 01/18/22 0704  Tue Jan 18, 2022  0259 Poke with daughter over the phone.  States she has been more sleepy recently and they noted a right-sided facial droop.  They were concerned given her history of stroke and head bleed. [CH]    Clinical Course User Index [CH] Tres Grzywacz, Barbette Hair, MD                           Medical Decision Making Amount and/or Complexity of Data Reviewed Labs: ordered. Radiology: ordered.  Risk Prescription drug management.   This patient presents to the ED for concern of fall, facial droop, this involves an extensive number of treatment options, and is a complaint that carries with it a high risk of complications and morbidity.  I considered the following differential and admission for this acute, potentially life threatening condition.  The differential diagnosis includes traumatic injury, acute infection such as COVID or influenza, UTI  MDM:    This is a 67 year old female with history of subdural hematoma status postcraniotomy in November and CVA who presents with fall.  She has no complaints on my evaluation.  She is nontoxic-appearing.  She does have a temperature of 100. There were reports of left-sided facial droop.  No persistent neurologic symptoms.  She does not have any complaints.  She was reportedly hypoxic.  She is not on oxygen at this time.  I spoke with her daughter who reported what appeared to be left-sided facial droop.  They were concerned regarding her fall.  She is also had some urinary symptoms.  Workup is notable for possible UTI.  Urine culture was sent and patient was given a dose of Rocephin.  No significant metabolic derangements.  No leukocytosis.  Given reports of hypoxia, COVID testing was sent.  She is COVID-19 positive as well.  CT scan shows chronic subdural.  She has a history of this in the past.  I did obtain an MRI to rule out acute CVA.  This is negative.  She is not  requiring oxygen here.  Will discharge with Paxlovid and Keflex.  (Labs, imaging, consults)  Labs:  I Ordered, and personally interpreted labs.  The pertinent results include: CBC, BMP, COVID and influenza, urinalysis  Imaging Studies ordered: I ordered imaging studies including CT head, chest x-ray, MRI I independently visualized and interpreted imaging. I agree with the radiologist interpretation  Additional history obtained from daughter over the phone.  External records from outside source obtained and reviewed including prior evaluations including prior craniotomy  Cardiac Monitoring: The patient was maintained on a cardiac monitor.  I personally viewed and interpreted the cardiac monitored which showed an underlying rhythm of: Sinus rhythm  Reevaluation: After the interventions noted above, I reevaluated the patient and found that they have :improved  Social Determinants of Health:  lives with family  Disposition: Discharge  Co morbidities that complicate the patient evaluation  Past Medical History:  Diagnosis Date   Abnormal chest CT    Anemia    Aortic atherosclerosis (HCC)    Asthma    Atrial enlargement, left    severe   Atrial fibrillation ([place]) 01/05/2011   Chronic persistent, failed DCCV    Atrial flutter ([place])    Bronchospasm 1998   Cardiomegaly    Carotid bruit    Chronic diastolic congestive heart failure ([place])    Diabetes mellitus    Type II   Ejection fraction < 50%    35-40%,    Heart murmur    History of blood transfusion    History of radiation therapy 03/18/19-04/15/19   endometrial - vaginal brachytherapy -  Dr. Sondra Come    Hypercholesterolemia    Hypertension    Junctional rhythm 09/14/2018   Noted on EKG   Left bundle branch block (LBBB) 09/14/2018   Noted on EKG   LVH (left ventricular hypertrophy) 10/10/2016   Moderate, Noted on ECHO   Mitral regurgitation    Obesity    Obesity (BMI 30-39.9) 01/16/2012   Persistent atrial  fibrillation ([place])    Polyp of rectum    Rheumatic fever 01/16/2012   Reported during childhood   S/P Maze operation for atrial fibrillation 02/15/2012   Complete biatrial lesion set using bipolar radiofrequency and cryothermy ablation with clipping of LA appendage   S/P mitral valve replacement with metallic valve 51/70/0174   50m Sorin [employer] Optiform mechanical prosthesis   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    Dispense:  20 capsule    Refill:  0   nirmatrelvir/ritonavir, renal dosing, (PAXLOVID) 10 x 150 MG & 10 x '100MG'$  TABS    Sig: Take 2 tablets by mouth 2 (two) times daily for 5 days. Patient GFR is 54.  Take nirmatrelvir (150 mg) one tablet twice daily for 5 days and ritonavir (100 mg) one tablet twice daily for 5 days.    Dispense:  20 tablet    Refill:  0    I have reviewed  the patients home medicines and have made adjustments as needed  Problem List / ED Course: Problem List Items Addressed This Visit   None Visit Diagnoses     Fall, initial encounter    -  Primary   COVID-19       Relevant Medications   cefTRIAXone (ROCEPHIN) 1 g in sodium chloride 0.9 % 100 mL IVPB (Completed)   cephALEXin (KEFLEX) 500 MG capsule   nirmatrelvir/ritonavir, renal dosing, (PAXLOVID) 10 x 150 MG & 10 x '100MG'$  TABS   Acute cystitis without hematuria                       Final Clinical Impression(s) / ED Diagnoses Final diagnoses:  Fall,  initial encounter  COVID-19  Acute cystitis without hematuria    Rx / DC Orders ED Discharge Orders          Ordered    cephALEXin (KEFLEX) 500 MG capsule  3 times daily        01/18/22 0700    nirmatrelvir/ritonavir, renal dosing, (PAXLOVID) 10 x 150 MG & 10 x '100MG'$  TABS  2 times daily        01/18/22 0700              [name], Barbette Hair, MD 01/18/22 [phone]

## 2022-01-19 ENCOUNTER — Other Ambulatory Visit: Payer: Self-pay | Admitting: Neurological Surgery

## 2022-01-19 DIAGNOSIS — S065XAA Traumatic subdural hemorrhage with loss of consciousness status unknown, initial encounter: Secondary | ICD-10-CM

## 2022-01-20 ENCOUNTER — Other Ambulatory Visit (HOSPITAL_COMMUNITY): Payer: Self-pay

## 2022-01-20 LAB — URINE CULTURE: Culture: >=100000 — AB

## 2022-01-21 ENCOUNTER — Ambulatory Visit: Payer: Medicare HMO | Admitting: Speech Pathology

## 2022-01-21 ENCOUNTER — Telehealth (HOSPITAL_BASED_OUTPATIENT_CLINIC_OR_DEPARTMENT_OTHER): Payer: Self-pay | Admitting: *Deleted

## 2022-01-21 ENCOUNTER — Ambulatory Visit: Payer: Medicare HMO | Admitting: Physical Therapy

## 2022-01-21 NOTE — Telephone Encounter (Signed)
Post ED Visit - Positive Culture Follow-up  Culture report reviewed by antimicrobial stewardship pharmacist: [place] Team '[]'$  Elenor Quinones, Pharm.D. '[]'$  Heide Guile, Pharm.D., BCPS AQ-ID '[]'$  Parks Neptune, Pharm.D., BCPS '[]'$  Alycia Rossetti, Pharm.D., BCPS '[]'$  [place], Pharm.D., BCPS, AAHIVP '[]'$  Legrand Como, Pharm.D., BCPS, AAHIVP '[]'$  Salome Arnt, PharmD, BCPS '[]'$  Johnnette Gourd, PharmD, BCPS '[]'$  Hughes Better, PharmD, BCPS '[]'$  Leeroy Cha, PharmD '[]'$  Laqueta Linden, PharmD, BCPS '[x]'$  Evorn Gong, PharmD  [place] Team '[]'$  Leodis Sias, PharmD '[]'$  Lindell Spar, PharmD '[]'$  Royetta Asal, PharmD '[]'$  Graylin Shiver, Rph '[]'$  Rema Fendt) Glennon Mac, PharmD '[]'$  Arlyn Dunning, PharmD '[]'$  Netta Cedars, PharmD '[]'$  Dia Sitter, PharmD '[]'$  Leone Haven, PharmD '[]'$  Gretta Arab, PharmD '[]'$  Theodis Shove, PharmD '[]'$  Peggyann Juba, PharmD '[]'$  Reuel Boom, PharmD   Positive urine culture Treated with Cephalexin, organism sensitive to the same and no further patient follow-up is required at this time.  Rosie Fate 01/21/2022, 2:10 PM

## 2022-01-25 ENCOUNTER — Encounter: Payer: Medicare HMO | Admitting: Speech Pathology

## 2022-01-25 ENCOUNTER — Encounter: Payer: Medicare HMO | Attending: Physical Medicine & Rehabilitation | Admitting: Physical Medicine & Rehabilitation

## 2022-01-25 ENCOUNTER — Encounter: Payer: Self-pay | Admitting: Physical Medicine & Rehabilitation

## 2022-01-25 VITALS — BP 122/78 | HR 72 | Ht 66.0 in | Wt 219.0 lb

## 2022-01-25 DIAGNOSIS — I10 Essential (primary) hypertension: Secondary | ICD-10-CM

## 2022-01-25 DIAGNOSIS — S065XAA Traumatic subdural hemorrhage with loss of consciousness status unknown, initial encounter: Secondary | ICD-10-CM | POA: Diagnosis not present

## 2022-01-25 DIAGNOSIS — I4891 Unspecified atrial fibrillation: Secondary | ICD-10-CM | POA: Insufficient documentation

## 2022-01-25 DIAGNOSIS — Z2989 Encounter for other specified prophylactic measures: Secondary | ICD-10-CM | POA: Diagnosis not present

## 2022-01-25 DIAGNOSIS — N289 Disorder of kidney and ureter, unspecified: Secondary | ICD-10-CM | POA: Diagnosis not present

## 2022-01-25 DIAGNOSIS — R339 Retention of urine, unspecified: Secondary | ICD-10-CM | POA: Diagnosis not present

## 2022-01-25 NOTE — Progress Notes (Signed)
Subjective:    Patient ID: [name], female    DOB: 1955/10/17, 67 y.o.   MRN: [id number]  HPI Note from hospital discharge summary 12/16/2021    Brief HPI:   TA FAIR is a 67 y.o. female with history of CHF, OSA, CKD, MVR-chronic Coumadin, left MCA stroke 2014, recent dental extraction who was evaluated in the ED 12/02/21 due to issues with ongoing bleeding since her procedure.  She will did report headaches which was followed by somnolence with confusion and CT of head done showing acute on chronic SDH with 16 mm shift to the left, 8 mm left to right midline shift and effacement of left greater than right psych eye.  INR at admission was 2.4 and was reversed with Kcentra.  Dr. Reatha Armour was consulted and she underwent left frontotemporal craniotomy for evacuation of hematoma on 11/24.  She was started on Keppra for seizure prophylaxis and cleared to start IV heparin on 11/27.  Recommendations were to repeat CT once heparin was therapeutic prior to transition to oral AC.     On 11/29 she had significant worsening of aphasia, problems with ADL and decreased ability to follow commands with more of a left bias with left gaze preference.  EEG was done showing area of epileptogenicity arising from the left frontal region with cortical dysfunction left hemisphere.  Repeat CT head showed mixed density left frontal parietal and temporal lobe which was unchanged and new small right temporal SDH which was unchanged.  She was cleared to resume Coumadin.  She had improvement in motor planning deficits with no overt left neglect or bias per recent therapy notes.  She continued to be intact bilaterally, motor planning deficits, minimal verbalization with decreasing carryover.  [facility]IR was recommended due to functional decline.     Hospital Course: [name] was admitted to rehab 12/10/2021 for inpatient therapies to consist of PT, ST and OT at least three hours five days a week. Past admission physiatrist,  therapy team and rehab RN have worked together to provide customized collaborative inpatient rehab.  Pharmacy has been assisting with dosing and management of Coumadin.  She was bridged with Lovenox with INR of 2.4 at discharge.  Coumadin dosing was discussed with [facility] pharmacist who concurred that Lovenox not needed due to bleeding risk and patient discharged on 6 mg Coumadin to start on 12/8 with INR to be repeated on 12/12 at coumadin clinic.  Patient and family have been educated on importance of restricting vitamin K foods after discharge.   She has been seizure-free on Keppra 500 mg twice daily and advised to follow-up with neurology for input on continuation of medication.  Her blood pressures were monitored on TID basis and has been reasonably controlled on current regimen. Her blood sugars were monitored on ac/hs with SSI used on prn basis.  Metformin was resumed at 250 mg and titrated to 50 mg twice daily.  Patient and daughter were advised to monitor blood sugars on 3 times daily basis and titrated further to 500 mg twice daily if blood sugars continue to be above 120.  Follow-up CBC shows H&H to be improving.  Check of electrolytes showed rise in serum creatinine to 1.14 and she was advised to increase fluid intake after discharge.  She has made great gains during her rehab stay and is currently at supervision level.  She will continue to receive outpatient PT, OT and ST at [facility] neuro rehab after discharge.  Rehab course: During patient's stay in rehab weekly team conferences was  held to monitor patient's progress, set goals and discuss barriers to discharge. At admission, patient required  min assist with basic ADL tasks and with mobility. 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 She  has had improvement in activity tolerance, balance, postural control as well as ability to compensate for deficits.  She is able to complete ADL tasks with supervision for safety.She is  independent for transfers and is able to ambulate 200 feet with supervision and verbal cues for safety.  She is able to come and indicate functioning with min to mod assist and able to comprehend basic to mildly complex information with min assist.  She requires mod verbal cues for speech errors and per family communication is at baseline compared to prior CVA 2014.  Family advised to provide min to mod cognitive assist and family education has been completed with daughter.   Interval history [name] is here for follow-up after completing treatment at [facility].  She reports she is doing well overall since her discharge.  She did have 1 fall without significant injury.  She reports she had a UTI and COVID at the time.  She is feeling better now.  She reports she is not having any significant pain.  She continues to work with therapy and is making progress.  She says her strength and speech have improved.  She has been seen by her PCP.  She continues to follow with Coumadin clinic for dosing adjustments.  She says she has follow-up with neurology scheduled.     Pain Inventory Average Pain 0 Pain Right Now 0 My pain is  no pain  LOCATION OF PAIN  no pain  BOWEL Number of stools per week: 3 Oral laxative use No  Type of laxative . Enema or suppository use No  History of colostomy No  Incontinent No   BLADDER Normal In and out cath, frequency . Able to self cath  . Bladder incontinence No  Frequent urination No  Leakage with coughing No  Difficulty starting stream No  Incomplete bladder emptying No    Mobility use a cane how many minutes can you walk? 15-20 ability to climb steps?  yes do you drive?  no  Function disabled: date disabled na  Neuro/Psych No problems in this area  Prior [facility] f/u  Physicians involved in your care Hospital f/u   Family History  Problem Relation Age of Onset   Asthma Mother    Diabetes Mother    Hypertension Mother     Hypertension Brother    Heart attack Neg Hx    Stroke Neg Hx    Colon cancer Neg Hx    Colon polyps Neg Hx    Liver disease Neg Hx    Uterine cancer Neg Hx    Ovarian cancer Neg Hx    Breast cancer Neg Hx    Social History   Socioeconomic History   Marital status: Married    Spouse name: Not on file   Number of children: Not on file   Years of education: Not on file   Highest education level: Not on file  Occupational History   Occupation: Conservation officer, nature    Employer: [employer]  Tobacco Use   Smoking status: Never   Smokeless tobacco: Never  Vaping Use   Vaping Use: Never used  Substance and Sexual Activity   Alcohol use: No   Drug use: No  Sexual activity: Yes    Birth control/protection: Post-menopausal  Other Topics Concern   Not on file  Social History Narrative   Pt lives in [place] with spouse.  Works at [employer]. 2 grown children, 1 grandchild   Social Determinants of Radio broadcast assistant Strain: Not on [employer] Insecurity: Not on file  Transportation Needs: Unknown (12/02/2021)   PRAPARE - Hydrologist (Medical): Patient refused    Lack of Transportation (Non-Medical): Patient refused  Physical Activity: Not on file  Stress: Not on file  Social Connections: Not on file   Past Surgical History:  Procedure Laterality Date   CARDIAC CATHETERIZATION  >5 years   CARDIOVASCULAR STRESS TEST  09/2010   CARDIOVERSION  03/25/2011   Procedure: CARDIOVERSION;  Surgeon: Jettie Booze, MD;  Location: [place];  Service: Cardiovascular;  Laterality: N/A;   CARDIOVERSION N/A 07/27/2012   Procedure: CARDIOVERSION;  Surgeon: Jettie Booze, MD;  Location: [place];  Service: Cardiovascular;  Laterality: N/A;   CESAREAN SECTION     COLONOSCOPY WITH PROPOFOL N/A 04/27/2016   Procedure: COLONOSCOPY WITH PROPOFOL;  Surgeon: Wilford Corner, MD;  Location: [place];  Service: Endoscopy;   Laterality: N/A;   CRANIOTOMY Left 12/03/2021   Procedure: FRONTOTEMPORAL CRANIOTOMY FOR EVACUATION SUBDURAL HEMATOMA;  Surgeon: Karsten Ro, DO;  Location: [place];  Service: Neurosurgery;  Laterality: Left;   INTRAOPERATIVE TRANSESOPHAGEAL ECHOCARDIOGRAM  02/15/2012   Procedure: INTRAOPERATIVE TRANSESOPHAGEAL ECHOCARDIOGRAM;  Surgeon: Rexene Alberts, MD;  Location: [place];  Service: Open Heart Surgery;  Laterality: N/A;   MAZE  02/15/2012   Procedure: MAZE;  Surgeon: Rexene Alberts, MD;  Location: [place];  Service: Open Heart Surgery;  Laterality: N/A;   MITRAL VALVE REPLACEMENT  02/15/2012   Procedure: MITRAL VALVE (MV) REPLACEMENT;  Surgeon: Rexene Alberts, MD;  Location: [place];  Service: Open Heart Surgery;  Laterality: N/A;   ROBOTIC ASSISTED LAPAROSCOPIC HYSTERECTOMY AND SALPINGECTOMY Bilateral 01/02/2019   Procedure: XI ROBOTIC ASSISTED LAPAROSCOPIC TOTAL HYSTERECTOMY WITH BILATERAL SALPINGOOPHORECTOMY, SENTINEL LYMPH NODE BIOPSY;  Surgeon: [name], MD;  Location: [facility];  Service: Gynecology;  Laterality: Bilateral;   TEE WITHOUT CARDIOVERSION  12/21/2011   Procedure: TRANSESOPHAGEAL ECHOCARDIOGRAM (TEE);  Surgeon: Jettie Booze, MD;  Location: [place];  Service: Cardiovascular;  Laterality: N/A;   TOOTH EXTRACTION N/A 11/19/2021   Procedure: DENTAL EXTRACTION TEETH NUMBER 14, 19, 30, 31;  Surgeon: Diona Browner, DMD;  Location: [place];  Service: Oral Surgery;  Laterality: N/A;   TRICUSPID VALVE REPLACEMENT  02/15/2012   Procedure: TRICUSPID VALVE REPAIR;  Surgeon: Rexene Alberts, MD;  Location: [place];  Service: Open Heart Surgery;  Laterality: N/A;   TUBAL LIGATION     at time of her c-section   Past Medical History:  Diagnosis Date   Abnormal chest CT    Anemia    Aortic atherosclerosis (HCC)    Asthma    Atrial enlargement, left    severe   Atrial fibrillation (HCC) 01/05/2011   Chronic persistent, failed DCCV    Atrial flutter (HCC)    Bronchospasm 1998    Cardiomegaly    Carotid bruit    Chronic diastolic congestive heart failure ([place])    Diabetes mellitus    Type II   Ejection fraction < 50%    35-40%,    Heart murmur    History of blood transfusion    History of radiation therapy 03/18/19-04/15/19  endometrial - vaginal brachytherapy -  Dr. Sondra Come    Hypercholesterolemia    Hypertension    Junctional rhythm 09/14/2018   Noted on EKG   Left bundle branch block (LBBB) 09/14/2018   Noted on EKG   LVH (left ventricular hypertrophy) 10/10/2016   Moderate, Noted on ECHO   Mitral regurgitation    Obesity    Obesity (BMI 30-39.9) 01/16/2012   Persistent atrial fibrillation ([place])    Polyp of rectum    Rheumatic fever 01/16/2012   Reported during childhood   S/P Maze operation for atrial fibrillation 02/15/2012   Complete biatrial lesion set using bipolar radiofrequency and cryothermy ablation with clipping of LA appendage   S/P mitral valve replacement with metallic valve 66/59/9357   57m Sorin [employer] Optiform mechanical prosthesis   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   239mEdwards mc3 ring annuloplasty   Shortness of breath    with exertion   Sleep apnea    DOES NOT HAVE CPAP   [id number]   Post op , left arm weakness   Tricuspid regurgitation 01/16/2012   Uterine cancer (HCC)    BP 122/78   Pulse 72   Ht '5\' 6"'$  (1.676 m)   Wt 219 lb (99.3 kg)   SpO2 96%   BMI 35.35 kg/m   Opioid Risk Score:   Fall Risk Score:  `1  [facility]     01/25/2022   11:21 AM  Depression screen PHQ 2/9  Decreased Interest 0  Down, Depressed, Hopeless 0  PHQ - 2 Score 0  Altered sleeping 0  Tired, decreased energy 0  Change in appetite 0  Feeling bad or failure about yourself  0  Trouble concentrating 0  Moving slowly or fidgety/restless 0  Suicidal thoughts 0  PHQ-9 Score 0     Review of Systems     Objective:   Physical Exam  Gen: no distress, normal appearing HEENT: oral mucosa pink  and moist, NCAT Chest: normal effort, normal rate of breathing Abd: soft, non-distended Ext: no edema Psych: pleasant, normal affect Skin: intact Neuro: Alert and awake, oriented x 4, no significant gaze preference noted today.  Follows commands, speech much improved since she was at [facility].  Mild right central 7 Strength 5 out of 5 in all 4 extremities, sensation intact light touch in all 4 extremities Musculoskeletal: No abnormal tone, no joint swelling tenderness  MRI 01/18/22 IMPRESSION: 1. No acute infarct identified. 2. Small Left (3 mm) and trace Right side Subdural Hematomas. No significant intracranial mass effect. Previous left side craniotomy. 3. Mild to moderate for age underlying chronic small vessel disease, including occasional chronic micro hemorrhages.     CT 01/18/22 Assessment & Plan:    spontaneous bilateral SDH d/t anticoagulation. Pt is s/p left fronto-temporal craniotomy on 11/24.  Appears to be doing very well and has made good progress.             -Continue outpatient PT OT and SLP  -Continue follow-up with neurology as directed  -She had recent cranial CT and MRI which appears stable  2. H/o A fib -Continue follow-up with clinic as directed  3. Seizure prophylaxis: Continues on Keppra 500  mg BID.              -- Denies recent seizures, discussed following up with neurology to discuss how long she will need to [place] 4. HTN.  Well-controlled continue to follow with PCP  5. Urinary retention.  -  Patient reports this has resolved.

## 2022-01-27 ENCOUNTER — Encounter: Payer: Self-pay | Admitting: Physical Therapy

## 2022-01-27 ENCOUNTER — Ambulatory Visit: Payer: Medicare HMO | Admitting: Speech Pathology

## 2022-01-27 ENCOUNTER — Ambulatory Visit: Payer: Medicare HMO | Admitting: Physical Therapy

## 2022-01-27 VITALS — BP 126/78 | HR 69

## 2022-01-27 DIAGNOSIS — R2681 Unsteadiness on feet: Secondary | ICD-10-CM

## 2022-01-27 DIAGNOSIS — R41841 Cognitive communication deficit: Secondary | ICD-10-CM | POA: Diagnosis not present

## 2022-01-27 DIAGNOSIS — R4701 Aphasia: Secondary | ICD-10-CM | POA: Diagnosis not present

## 2022-01-27 DIAGNOSIS — M6281 Muscle weakness (generalized): Secondary | ICD-10-CM

## 2022-01-27 DIAGNOSIS — R29898 Other symptoms and signs involving the musculoskeletal system: Secondary | ICD-10-CM | POA: Diagnosis not present

## 2022-01-27 DIAGNOSIS — R269 Unspecified abnormalities of gait and mobility: Secondary | ICD-10-CM

## 2022-01-27 NOTE — Therapy (Signed)
OUTPATIENT PHYSICAL THERAPY NEURO TREATMENT   Patient Name: [name] MRN: [id number] DOB:Jan 10, 1956, 67 y.o., female Today's Date: 01/27/2022   PCP: Lennie Odor, [place] REFERRING PROVIDER: Cathlyn Parsons, PA-C  END OF SESSION:  PT End of Session - 01/27/22 1102     Visit Number 3    Number of Visits 7    Date for PT Re-Evaluation 03/03/22    Authorization Type [employer]    PT Start Time 1101    PT Stop Time 0254    PT Time Calculation (min) 42 min    Equipment Utilized During Treatment Gait belt    Activity Tolerance Patient tolerated treatment well    Behavior During Therapy WFL for tasks assessed/performed             Past Medical History:  Diagnosis Date   Abnormal chest CT    Anemia    Aortic atherosclerosis (HCC)    Asthma    Atrial enlargement, left    severe   Atrial fibrillation ([place]) 01/05/2011   Chronic persistent, failed DCCV    Atrial flutter ([place])    Bronchospasm 1998   Cardiomegaly    Carotid bruit    Chronic diastolic congestive heart failure ([place])    Diabetes mellitus    Type II   Ejection fraction < 50%    35-40%,    Heart murmur    History of blood transfusion    History of radiation therapy 03/18/19-04/15/19   endometrial - vaginal brachytherapy -  Dr. Sondra Come    Hypercholesterolemia    Hypertension    Junctional rhythm 09/14/2018   Noted on EKG   Left bundle branch block (LBBB) 09/14/2018   Noted on EKG   LVH (left ventricular hypertrophy) 10/10/2016   Moderate, Noted on ECHO   Mitral regurgitation    Obesity    Obesity (BMI 30-39.9) 01/16/2012   Persistent atrial fibrillation ([place])    Polyp of rectum    Rheumatic fever 01/16/2012   Reported during childhood   S/P Maze operation for atrial fibrillation 02/15/2012   Complete biatrial lesion set using bipolar radiofrequency and cryothermy ablation with clipping of LA appendage   S/P mitral valve replacement with metallic valve 27/06/2374   63m Sorin [employer]  Optiform mechanical prosthesis   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   228mEdwards mc3 ring annuloplasty   Shortness of breath    with exertion   Sleep apnea    DOES NOT HAVE CPAP   Stroke ([facility]   Post op , left arm weakness   Tricuspid regurgitation 01/16/2012   Uterine cancer ([facility]   Past Surgical History:  Procedure Laterality Date   CARDIAC CATHETERIZATION  >5 years   CARDIOVASCULAR STRESS TEST  09/2010   CARDIOVERSION  03/25/2011   Procedure: CARDIOVERSION;  Surgeon: JaJettie BoozeMD;  [facility] Service: Cardiovascular;  Laterality: N/A;   CARDIOVERSION N/A 07/27/2012   Procedure: CARDIOVERSION;  Surgeon: JaJettie BoozeMD;  [facility] Service: Cardiovascular;  Laterality: N/A;   CESAREAN SECTION     COLONOSCOPY WITH PROPOFOL N/A 04/27/2016   Procedure: COLONOSCOPY WITH PROPOFOL;  Surgeon: ViWilford CornerMD;  [facility];  Service: Endoscopy;  Laterality: N/A;   CRANIOTOMY Left 12/03/2021   Procedure: FRONTOTEMPORAL CRANIOTOMY FOR EVACUATION SUBDURAL HEMATOMA;  Surgeon: DaKarsten RoDO;  [facility] Service: Neurosurgery;  Laterality: Left;   INTRAOPERATIVE TRANSESOPHAGEAL ECHOCARDIOGRAM  02/15/2012   Procedure: INTRAOPERATIVE TRANSESOPHAGEAL ECHOCARDIOGRAM;  Surgeon: Rexene Alberts, MD;  Location: [place];  Service: Open Heart Surgery;  Laterality: N/A;   MAZE  02/15/2012   Procedure: MAZE;  Surgeon: Rexene Alberts, MD;  Location: [place];  Service: Open Heart Surgery;  Laterality: N/A;   MITRAL VALVE REPLACEMENT  02/15/2012   Procedure: MITRAL VALVE (MV) REPLACEMENT;  Surgeon: Rexene Alberts, MD;  Location: [place];  Service: Open Heart Surgery;  Laterality: N/A;   ROBOTIC ASSISTED LAPAROSCOPIC HYSTERECTOMY AND SALPINGECTOMY Bilateral 01/02/2019   Procedure: XI ROBOTIC ASSISTED LAPAROSCOPIC TOTAL HYSTERECTOMY WITH BILATERAL SALPINGOOPHORECTOMY, SENTINEL LYMPH NODE BIOPSY;  Surgeon: [name], MD;  [facility];  Service: Gynecology;  Laterality: Bilateral;   TEE WITHOUT CARDIOVERSION  12/21/2011   Procedure: TRANSESOPHAGEAL ECHOCARDIOGRAM (TEE);  Surgeon: Jettie Booze, MD;  Location: [place];  Service: Cardiovascular;  Laterality: N/A;   TOOTH EXTRACTION N/A 11/19/2021   Procedure: DENTAL EXTRACTION TEETH NUMBER 14, 19, 30, 31;  Surgeon: Diona Browner, DMD;  Location: [place];  Service: Oral Surgery;  Laterality: N/A;   TRICUSPID VALVE REPLACEMENT  02/15/2012   Procedure: TRICUSPID VALVE REPAIR;  Surgeon: Rexene Alberts, MD;  Location: [place];  Service: Open Heart Surgery;  Laterality: N/A;   TUBAL LIGATION     at time of her c-section   Patient Active Problem List   Diagnosis Date Noted   Hypertension associated with diabetes ([place]) 12/08/2021   Anticoagulated on warfarin 12/07/2021   Acute subdural hematoma ([place]) 12/02/2021   Chronic anticoagulation - on coumadin for mechanical mitral valve, afib 12/02/2021   Subdural hematoma, nontraumatic ([place]) 12/02/2021   Pancytopenia, acquired ([place]) 06/21/2019   Peripheral neuropathy due to chemotherapy ([place]) 06/21/2019   Anemia, chronic disease 05/21/2019   Drug-induced hyperglycemia 02/25/2019   Stage 3a chronic kidney disease (CKD) ([place]) 02/04/2019   Iron deficiency anemia 01/18/2019   Endometrial cancer ([place]) 01/02/2019   Uterine cancer ([place]) 12/27/2018   Special screening for malignant neoplasms, colon 04/27/2016   LBBB (left bundle branch block) 10/07/2013   Encounter for therapeutic drug monitoring 03/19/2013   Mitral valve disorders(424.0) 10/12/2012   Heart valve replaced by other means 10/12/2012   Atrial flutter ([place])    CVA (cerebral infarction) 04/14/2012   S/P TVR (tricuspid valve repair) 03/19/2012   S/P MVR (mitral valve replacement) 03/19/2012   History of mitral valve replacement with mechanical valve 02/15/2012   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   S/P Maze operation for atrial fibrillation 02/15/2012   CHF  (congestive heart failure) ([place]) 02/06/2012   MR (mitral regurgitation) 02/06/2012   Tricuspid regurgitation 01/16/2012   Rheumatic fever 01/16/2012   Obesity (BMI 30-39.9) 01/16/2012   Mitral regurgitation    Chronic diastolic CHF (congestive heart failure) ([place]) 12/19/2011   Snoring 02/28/2011   Atrial fibrillation ([place]) 70/17/7939   Chronic systolic dysfunction of left ventricle 01/05/2011   Hypertension 10/18/2010   Type 2 diabetes mellitus ([place]) 10/18/2010   Hypercholesterolemia 10/18/2010   Mediastinal lymphadenopathy 10/18/2010   Asthma 10/18/2010    ONSET DATE: 12/15/2021 (referral date)  REFERRING DIAG: S06.5XAA (ICD-10-CM) - SDH (subdural hematoma) (HCC)  THERAPY DIAG:  Unsteadiness on feet  Muscle weakness (generalized)  Abnormality of gait and mobility  Rationale for Evaluation and Treatment: Rehabilitation  SUBJECTIVE:  SUBJECTIVE STATEMENT:  Patient had one fall since last session; she thinks it happened Monday night. She was trying to sit in a chair; she reports that light was on but she did not have her LBQC. She was able to get up with the help of her husband and son. She went to ED and was found to have no injuries. Patient reports no pain/changes to her medications.   Pt accompanied by: self  PERTINENT HISTORY:   Per Note on 12/10/2021:  "Brief HPI:   [name] is a 67 y.o. female with history of CHF, OSA, CKD, MVR-chronic Coumadin, left MCA stroke 2014, recent dental extraction who was evaluated in the ED 12/02/21 due to issues with ongoing bleeding since her procedure.  She will did report headaches which was followed by somnolence with confusion and CT of head done showing acute on chronic SDH with 16 mm shift to the left, 8 mm left to right midline shift and  effacement of left greater than right psych eye.  INR at admission was 2.4 and was reversed with Kcentra.  Dr. Reatha Armour was consulted and she underwent left frontotemporal craniotomy for evacuation of hematoma on 11/24.  She was started on Keppra for seizure prophylaxis and cleared to start IV heparin on 11/27.  Recommendations were to repeat CT once heparin was therapeutic prior to transition to oral [facility]."  Patient transferred to [facility] on 12/10/2021 per notes and saw all three disciplines. Finished skilled physical therapy in that setting on 12/15/2021.  PAIN:  Are you having pain? No  PRECAUTIONS: Fall and ICD/Pacemaker  WEIGHT BEARING RESTRICTIONS: No  FALLS: Has patient fallen in last 6 months?  Patient reports no falls in last 6 months  PATIENT GOALS: "I want my speech to be better. My family want me to work on my safety."  OBJECTIVE:   DIAGNOSTIC FINDINGS:  12/08/2021 CT Head w/o Contrast: "IMPRESSION: 1. Mixed density subdural hematoma on the left is unchanged. Mass-effect with 4 mm midline shift to the right unchanged. 2. Small right temporal subdural hematoma unchanged. 3. No new area of hemorrhage."  Vitals:   01/27/22 1107  BP: 126/78  Pulse: 69    TODAY'S TREATMENT:                                                                                                                               NMR:  - Blaze pod tapping on compliant surface and elevated 6" step without AD + CGA and lateral steps in between 3 x 30" - Blaze pod tapping on compliant surface and elevated 6" step without AD + CGA and lateral steps in between with 5lb ankle weights donned for error augmentation 3 x 30" -Alt UE to LE taps on compliant surface with CGA 2 x 10  Gait: - 1 x 115' with 5lb ankle weights donned for error augmentation for foot clearance and CGA and no AD  - 1 x  115' with SBA and no AD for carryover of foot pickup (reduced foot dragging noted)  There Act:  Ramp  navigation on compliant surface with LBQC + AD with CGA x 2 Curb navigation on compliant surface with LBQC + AD with CGA x 2 10# Med ball pickups/slams x 5 with CGA (SPO2 dropped to 94 but increased to 98 after seated rest break)  PATIENT EDUCATION: Education details: Continue HEP, fall safety meaures Person educated: Patient Education method: Theatre stage manager Education comprehension: verbalized understanding and needs further education  HOME EXERCISE PROGRAM: Access Code: [email or web address] URL: [email or web address] Date: 01/13/2022 Prepared by: Malachi Carl  Exercises - Sit to Stand Without Arm Support  - 1 x daily - 7 x weekly - 3 sets - 5 reps - Corner Balance Feet Together: Eyes Closed With Head Turns  - 1 x daily - 7 x weekly - 3 sets - 10 reps - Standing 3-Way Kick  - 1 x daily - 7 x weekly - 3 sets - 10 reps - Standing Marching  - 1 x daily - 7 x weekly - 3 sets - 10 reps  KB Home	[place] 6 Week Beginner Walking Program - pdf provided  GOALS: Goals reviewed with patient? Yes  LONG TERM GOALS: Target date: 03/03/2022 (LTG only given length of POC)  Patient will report demonstrate independence with final HEP in order to maintain current gains and continue to progress after physical therapy discharge.   Baseline: To be provided Goal status: INITIAL  2.  Patient will improve gait speed to 1.1 m/s or greater with LRAD to indicate a reduced risk for falls.   Baseline: 0.81 m/s Goal status: INITIAL  3.  Patient will improve their 5x Sit to Stand score to less than 15 seconds to demonstrate a decreased risk for falls and improved LE strength.   Baseline: 21.76" with decreased eccentric control to sit and mild LOB on final sit to stand and able to recover independently Goal status: INITIAL  4.  Patient will improve ABC Score to 67% or greater to indicate a decreased risk for falls and improved self-reported confidence in balance and sense of steadiness.    Baseline: 61.3% Goal status: INITIAL  5.  Patient will improve 2MWT with LRAD to 155.2 m or greater to achieve age reported norms and demonstrate improved cardiorespiratory endurance and mobility. Baseline: 89.64mGoal status: INITIAL   ASSESSMENT:  CLINICAL IMPRESSION: Session emphasized higher level functional gait/mobility tasks with emphasis on improved LE clearance to reduce risk of future falls. Patient demonstrated notable improved foot clearance/carry over after use of 5# ankle weights in session. Limited tolerance for med balls slams and so held further reps. Improved on second rep of curb/ramp navigation on compliant surface. Patient will benefit from skilled physical therapy services to address these impairments and improve overall function.    OBJECTIVE IMPAIRMENTS: Abnormal gait, decreased balance, decreased knowledge of use of DME, decreased mobility, difficulty walking, decreased strength, decreased safety awareness, impaired sensation, and impaired tone.   ACTIVITY LIMITATIONS: standing, squatting, and caring for others  PARTICIPATION LIMITATIONS: driving and community activity  PERSONAL FACTORS: Age, Past/current experiences, and 1-2 comorbidities: see above  are also affecting patient's functional outcome.   REHAB POTENTIAL: Good  CLINICAL DECISION MAKING: Evolving/moderate complexity  EVALUATION COMPLEXITY: Moderate  PLAN:  PT FREQUENCY: 1x/week  PT DURATION: 8 weeks  PLANNED INTERVENTIONS: Therapeutic exercises, Therapeutic activity, Neuromuscular re-education, Gait training, Self Care, and Manual therapy  PLAN FOR NEXT SESSION: Dynamic gait tasks, sit  to stands for speed, treadmill and increased gait speed, review HEP + walking program progress; fall recovery; patient enjoyed use of blaze pods  Malachi Carl, PT, DPT  01/27/2022, 12:27 PM

## 2022-01-27 NOTE — Therapy (Signed)
OUTPATIENT SPEECH LANGUAGE PATHOLOGY TREATMENT   Patient Name: [name] MRN: [id number] DOB:11/10/1955, 67 y.o., female Today's Date: 01/27/2022  PCP: Lennie Odor, [place] REFERRING PROVIDER: Cathlyn Parsons., PA-C  END OF SESSION:  End of Session - 01/27/22 1013     Visit Number 3    Number of Visits 25    Date for SLP Re-Evaluation 03/31/22    Authorization Type [employer]    Progress Note Due on Visit 10    SLP Start Time 1015    SLP Stop Time  1100    SLP Time Calculation (min) 45 min    Activity Tolerance Patient tolerated treatment well              Past Medical History:  Diagnosis Date   Abnormal chest CT    Anemia    Aortic atherosclerosis (HCC)    Asthma    Atrial enlargement, left    severe   Atrial fibrillation ([place]) 01/05/2011   Chronic persistent, failed DCCV    Atrial flutter (HCC)    Bronchospasm 1998   Cardiomegaly    Carotid bruit    Chronic diastolic congestive heart failure ([place])    Diabetes mellitus    Type II   Ejection fraction < 50%    35-40%,    Heart murmur    History of blood transfusion    History of radiation therapy 03/18/19-04/15/19   endometrial - vaginal brachytherapy -  Dr. Sondra Come    Hypercholesterolemia    Hypertension    Junctional rhythm 09/14/2018   Noted on EKG   Left bundle branch block (LBBB) 09/14/2018   Noted on EKG   LVH (left ventricular hypertrophy) 10/10/2016   Moderate, Noted on ECHO   Mitral regurgitation    Obesity    Obesity (BMI 30-39.9) 01/16/2012   Persistent atrial fibrillation ([place])    Polyp of rectum    Rheumatic fever 01/16/2012   Reported during childhood   S/P Maze operation for atrial fibrillation 02/15/2012   Complete biatrial lesion set using bipolar radiofrequency and cryothermy ablation with clipping of LA appendage   S/P mitral valve replacement with metallic valve 07/37/1062   91m Sorin [employer] Optiform mechanical prosthesis   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    267mEdwards mc3 ring annuloplasty   Shortness of breath    with exertion   Sleep apnea    DOES NOT HAVE CPAP   [place]   Post op , left arm weakness   Tricuspid regurgitation 01/16/2012   [facility]   Past Surgical History:  Procedure Laterality Date   CARDIAC CATHETERIZATION  >5 years   CARDIOVASCULAR STRESS TEST  09/2010   CARDIOVERSION  03/25/2011   Procedure: CARDIOVERSION;  Surgeon: JaJettie BoozeMD;  [place] [facility] Service: Cardiovascular;  Laterality: N/A;   CARDIOVERSION N/A 07/27/2012   Procedure: CARDIOVERSION;  Surgeon: JaJettie BoozeMD;  [facility];  Service: Cardiovascular;  Laterality: N/A;   CESAREAN SECTION     COLONOSCOPY WITH PROPOFOL N/A 04/27/2016   Procedure: COLONOSCOPY WITH PROPOFOL;  Surgeon: ViWilford CornerMD;  [facility];  Service: Endoscopy;  Laterality: N/A;   CRANIOTOMY Left 12/03/2021   Procedure: FRONTOTEMPORAL CRANIOTOMY FOR EVACUATION SUBDURAL HEMATOMA;  Surgeon: DaKarsten RoDO;  [place] Service: Neurosurgery;  Laterality: Left;   INTRAOPERATIVE TRANSESOPHAGEAL ECHOCARDIOGRAM  02/15/2012   Procedure: INTRAOPERATIVE TRANSESOPHAGEAL ECHOCARDIOGRAM;  Surgeon: ClRexene AlbertsMD;  [place]  Service: Open Heart Surgery;  Laterality: N/A;   MAZE  02/15/2012   Procedure: MAZE;  Surgeon: Rexene Alberts, MD;  Location: [place];  Service: Open Heart Surgery;  Laterality: N/A;   MITRAL VALVE REPLACEMENT  02/15/2012   Procedure: MITRAL VALVE (MV) REPLACEMENT;  Surgeon: Rexene Alberts, MD;  Location: [place];  Service: Open Heart Surgery;  Laterality: N/A;   ROBOTIC ASSISTED LAPAROSCOPIC HYSTERECTOMY AND SALPINGECTOMY Bilateral 01/02/2019   Procedure: XI ROBOTIC ASSISTED LAPAROSCOPIC TOTAL HYSTERECTOMY WITH BILATERAL SALPINGOOPHORECTOMY, SENTINEL LYMPH NODE BIOPSY;  Surgeon: [name], MD;  [facility];  Service: Gynecology;  Laterality: Bilateral;   TEE WITHOUT  CARDIOVERSION  12/21/2011   Procedure: TRANSESOPHAGEAL ECHOCARDIOGRAM (TEE);  Surgeon: Jettie Booze, MD;  Location: [place];  Service: Cardiovascular;  Laterality: N/A;   TOOTH EXTRACTION N/A 11/19/2021   Procedure: DENTAL EXTRACTION TEETH NUMBER 14, 19, 30, 31;  Surgeon: Diona Browner, DMD;  Location: [place];  Service: Oral Surgery;  Laterality: N/A;   TRICUSPID VALVE REPLACEMENT  02/15/2012   Procedure: TRICUSPID VALVE REPAIR;  Surgeon: Rexene Alberts, MD;  Location: [place];  Service: Open Heart Surgery;  Laterality: N/A;   TUBAL LIGATION     at time of her c-section   Patient Active Problem List   Diagnosis Date Noted   Hypertension associated with diabetes ([place]) 12/08/2021   Anticoagulated on warfarin 12/07/2021   Acute subdural hematoma ([place]) 12/02/2021   Chronic anticoagulation - on coumadin for mechanical mitral valve, afib 12/02/2021   Subdural hematoma, nontraumatic ([place]) 12/02/2021   Pancytopenia, acquired ([place]) 06/21/2019   Peripheral neuropathy due to chemotherapy ([place]) 06/21/2019   Anemia, chronic disease 05/21/2019   Drug-induced hyperglycemia 02/25/2019   Stage 3a chronic kidney disease (CKD) ([place]) 02/04/2019   Iron deficiency anemia 01/18/2019   Endometrial cancer ([place]) 01/02/2019   Uterine cancer ([place]) 12/27/2018   Special screening for malignant neoplasms, colon 04/27/2016   LBBB (left bundle branch block) 10/07/2013   Encounter for therapeutic drug monitoring 03/19/2013   Mitral valve disorders(424.0) 10/12/2012   Heart valve replaced by other means 10/12/2012   Atrial flutter ([place])    CVA (cerebral infarction) 04/14/2012   S/P TVR (tricuspid valve repair) 03/19/2012   S/P MVR (mitral valve replacement) 03/19/2012   History of mitral valve replacement with mechanical valve 02/15/2012   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   S/P Maze operation for atrial fibrillation 02/15/2012   CHF (congestive heart failure) ([place]) 02/06/2012   MR (mitral  regurgitation) 02/06/2012   Tricuspid regurgitation 01/16/2012   Rheumatic fever 01/16/2012   Obesity (BMI 30-39.9) 01/16/2012   Mitral regurgitation    Chronic diastolic CHF (congestive heart failure) ([place]) 12/19/2011   Snoring 02/28/2011   Atrial fibrillation ([place]) 70/62/3762   Chronic systolic dysfunction of left ventricle 01/05/2011   Hypertension 10/18/2010   Type 2 diabetes mellitus ([place]) 10/18/2010   Hypercholesterolemia 10/18/2010   Mediastinal lymphadenopathy 10/18/2010   Asthma 10/18/2010    ONSET DATE: 12-02-2021   REFERRING DIAG: G31.5XAA (ICD-10-CM) - SDH (subdural hematoma) (HCC)   THERAPY DIAG:  Aphasia  Cognitive communication deficit  Rationale for Evaluation and Treatment: Rehabilitation  SUBJECTIVE:   SUBJECTIVE STATEMENT: Pt arrives with HEP complete, reports some stimuli were more challenging than others.   PERTINENT HISTORY: L-MCA 2014, dental procedure 11-19-2021 resulting in ongoing bleeding. Presented to ED 12-02-2021, imaging revealed acute on chronic SDH. While admitted began showing speech disturbances. Underwent L frontotemporal craniotomy for evacuation of hematoma 12-03-2021. Received ST services  while admitted to inpatient rehab.   PAIN:  Are you having pain? No  FALLS: Has patient fallen in last 6 months?  No  PATIENT GOALS: "do how I did before"  OBJECTIVE:   PATIENT REPORTED OUTCOME MEASURES (PROM): Deferred d/t time, will complete initial therapy session  TODAY'S TREATMENT: 01-27-22: SLP reviewed HEP and facilitated direct instruction for challenging items. Use of additional, similar stimuli employed to reinforce challenging phoneme/grapheme correspondence. Pt evidencing sub-optimal phoneme/grapheme correspondence at sound level which is barrier for reading and spelling. SLP provided pt with comprehensive list of orthographic representations of consonant phonemes and consonant blends to assess which require additional instruction. Of  31, pt requires model and verbal cueing to produce correct phoneme for 19. SLP provides key words, articulatory cues, and descriptive representations to reinforce target sound. Pt evidencing increased difficulty with x5 blends addressed today's session. Will continue to work on strengthening this understanding next session.   01-13-22: Target strengthening of phonemic to orthographic correspondence through use of Copy and Recall treatment, to aid in reading and spelling difficulties which are ongoing since more recent stroke. Generate core list of words to train frequent representations found when reading writing. Pt names 20/20 items with rare semantic cues. Initial attempt of spelling without support of SLP results in 15/20 targets spelled correctly. Able to generate accurate spelling of additional targets given mod-A from SLP Following, with use of visual aid, is able to write all targets accurately x3 with no instances of SLP needing to cue for correction. Following SLP verbally dictated single syllable words, with pt able to spell 15/19 correctly with occasional min to mod-A from SLP. Updated HEP with spelling/reading practice with instruction to focus on saying work out loud and listening for sounds to A with spelling.   01-06-2022: Education provided on evaluation results and SLP's recommendations. Pt verbalizes agreement with POC, all questions answered to satisfaction.   PATIENT EDUCATION: Education details: see above Person educated: Patient Education method: Consulting civil engineer, Demonstration, Verbal cues, and Handouts Education comprehension: verbalized understanding, returned demonstration, and needs further education   GOALS: Goals reviewed with patient? Yes  SHORT TERM GOALS: Target date: 02-03-2022  Pt will successfully complete moderately complex language tasks in 90% of opportunities given occasional min-A over 2 sessions Baseline: Goal status: INITIAL  2.  Pt will write 18/20 trained  words accurately with occasional min-A  Baseline:  Goal status: INITIAL  3.  During structured exercise, pt will successful demonstrate anomia strategy with rare min-A in 80% of trials  Baseline:  Goal status: INITIAL  4.  Pt will ID and attempt to correct paraphasia during structured activities in 50% of opportunities  Baseline:  Goal status: INITIAL   LONG TERM GOALS: Target date: 03-31-2022  Pt will complete HEP 5/7 days over 2 week period with min-A from family  Baseline:  Goal status: INITIAL  2.  Pt will report improvement via patient reported outcome measure by d/c Baseline: to be completed initial therapy session Goal status: INITIAL  3.  Pt will ID and attempt to correct paraphasia during structured activities in 80% of opportunities  Baseline:  Goal status: INITIAL  4.  Pt will use external support to A in reading of information on phone with mod-I following direct instruction Baseline:  Goal status: INITIAL  5.  Pt will generate approximate spelling of untrained words, efficacy or which to be judged by SLP being able to determine target in 80% of trials  Baseline:  Goal status: INITIAL  ASSESSMENT:  CLINICAL IMPRESSION: Patient is a 67 y.o. F who was seen today for cognitive linguistic evaluation. Pt presents with moderate expressive aphasia c/b effortful speech with usual word finding, occasional paraphasias. Additional impairments of agraphia and alexia. Based on chart review, appears pt's aphasia may be close to baseline from 2014 stroke, however family is not present to confirm or provide nuanced information. Per CIR documentation, perseveration and semantic paraphasias are acute to current event. Pt endorses reading and writing deficits are newly presenting as well. Auditory comprehension relative strength and within gross functional limits per clinical observations and performance on QAB, with extended time and occasional repetitions. Verbal expression is  notable for frequent pausing d/t word finding deficits, agrammatism, and reduced complexity. Is able to express basic thoughts and ideas, to include asking question, with extended time. Requires some inference from SLP, with confirmation to ensure understanding, to fully appreciate pt's intended message. Simple, one to one phonemic to orthographic representation intact for reading/writing, though laborious. Pt would benefit from skilled ST to optimize communication efficacy, reduce frustration and caregiver burden.  OBJECTIVE IMPAIRMENTS: include memory, executive functioning, receptive language, aphasia, and apraxia. These impairments are limiting patient from managing medications, managing appointments, managing finances, household responsibilities, ADLs/IADLs, and effectively communicating at home and in community. Factors affecting potential to achieve goals and functional outcome are previous level of function, severity of impairments, financial resources, and family/community support. Patient will benefit from skilled SLP services to address above impairments and improve overall function.  REHAB POTENTIAL: Fair (prior level of function s/p stroke 2014)  PLAN:  SLP FREQUENCY: 1-2x/week - SLP recommends 2x a week, pt elects to initiate therapy at 1x a week   SLP DURATION: 12 weeks  PLANNED INTERVENTIONS: Language facilitation, Cueing hierachy, Cognitive reorganization, Internal/external aids, Functional tasks, Multimodal communication approach, SLP instruction and feedback, Compensatory strategies, and Patient/family education  Su Monks, CCC-SLP 01/27/2022, 10:13 AM

## 2022-01-28 ENCOUNTER — Ambulatory Visit: Payer: Medicare HMO | Attending: Interventional Cardiology | Admitting: *Deleted

## 2022-01-28 DIAGNOSIS — I480 Paroxysmal atrial fibrillation: Secondary | ICD-10-CM

## 2022-01-28 DIAGNOSIS — Z5181 Encounter for therapeutic drug level monitoring: Secondary | ICD-10-CM

## 2022-01-28 DIAGNOSIS — Z954 Presence of other heart-valve replacement: Secondary | ICD-10-CM

## 2022-01-28 LAB — POCT INR: INR: 3 (ref 2.0–3.0)

## 2022-01-28 NOTE — Patient Instructions (Addendum)
Description   Continue taking warfarin 1.5 tablets Daily, EXCEPT 2 TABLETS ON WEDNESDAYS. Stay consistent with greens each week (2-3 times per week).  Recheck INR in 2 weeks. Call coumadin clinic for any changes in medications or up coming procedures. [phone].

## 2022-02-01 ENCOUNTER — Ambulatory Visit: Payer: Medicare HMO | Admitting: Physical Therapy

## 2022-02-01 ENCOUNTER — Encounter: Payer: Self-pay | Admitting: Physical Therapy

## 2022-02-01 ENCOUNTER — Ambulatory Visit: Payer: Medicare HMO | Admitting: Speech Pathology

## 2022-02-01 ENCOUNTER — Encounter: Payer: Medicare HMO | Admitting: Speech Pathology

## 2022-02-01 VITALS — BP 125/83 | HR 74

## 2022-02-01 DIAGNOSIS — R269 Unspecified abnormalities of gait and mobility: Secondary | ICD-10-CM

## 2022-02-01 DIAGNOSIS — M6281 Muscle weakness (generalized): Secondary | ICD-10-CM | POA: Diagnosis not present

## 2022-02-01 DIAGNOSIS — R41841 Cognitive communication deficit: Secondary | ICD-10-CM

## 2022-02-01 DIAGNOSIS — R29898 Other symptoms and signs involving the musculoskeletal system: Secondary | ICD-10-CM | POA: Diagnosis not present

## 2022-02-01 DIAGNOSIS — R4701 Aphasia: Secondary | ICD-10-CM | POA: Diagnosis not present

## 2022-02-01 DIAGNOSIS — R2681 Unsteadiness on feet: Secondary | ICD-10-CM

## 2022-02-01 NOTE — Therapy (Signed)
OUTPATIENT SPEECH LANGUAGE PATHOLOGY TREATMENT   Patient Name: [name] MRN: [id number] DOB:[birth year], 67 y.o., female Today's Date: 02/01/2022  PCP: Lennie Odor, [place] REFERRING PROVIDER: Cathlyn Parsons., PA-C  END OF SESSION:  End of Session - 02/01/22 1446     Visit Number 4    Number of Visits 25    Date for SLP Re-Evaluation 03/31/22    Authorization Type [employer]    Progress Note Due on Visit 10    SLP Start Time 1447    SLP Stop Time  9528    SLP Time Calculation (min) 43 min    Activity Tolerance Patient tolerated treatment well               Past Medical History:  Diagnosis Date   Abnormal chest CT    Anemia    Aortic atherosclerosis (HCC)    Asthma    Atrial enlargement, left    severe   Atrial fibrillation ([place]) 01/05/2011   Chronic persistent, failed DCCV    Atrial flutter (HCC)    Bronchospasm 1998   Cardiomegaly    Carotid bruit    Chronic diastolic congestive heart failure ([place])    Diabetes mellitus    Type II   Ejection fraction < 50%    35-40%,    Heart murmur    History of blood transfusion    History of radiation therapy 03/18/19-04/15/19   endometrial - vaginal brachytherapy -  Dr. Sondra Come    Hypercholesterolemia    Hypertension    Junctional rhythm 09/14/2018   Noted on EKG   Left bundle branch block (LBBB) 09/14/2018   Noted on EKG   LVH (left ventricular hypertrophy) 10/10/2016   Moderate, Noted on ECHO   Mitral regurgitation    Obesity    Obesity (BMI 30-39.9) 01/16/2012   Persistent atrial fibrillation ([place])    Polyp of rectum    Rheumatic fever 01/16/2012   Reported during childhood   S/P Maze operation for atrial fibrillation 02/15/2012   Complete biatrial lesion set using bipolar radiofrequency and cryothermy ablation with clipping of LA appendage   S/P mitral valve replacement with metallic valve 41/32/4401   62m Sorin [employer] Optiform mechanical prosthesis   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    225mEdwards mc3 ring annuloplasty   Shortness of breath    with exertion   Sleep apnea    DOES NOT HAVE CPAP   [place]   Post op , left arm weakness   Tricuspid regurgitation 01/16/2012   [facility]   Past Surgical History:  Procedure Laterality Date   CARDIAC CATHETERIZATION  >5 years   CARDIOVASCULAR STRESS TEST  09/2010   CARDIOVERSION  03/25/2011   Procedure: CARDIOVERSION;  Surgeon: JaJettie BoozeMD;  [facility] Service: Cardiovascular;  Laterality: N/A;   CARDIOVERSION N/A 07/27/2012   Procedure: CARDIOVERSION;  Surgeon: JaJettie BoozeMD;  [facility] Service: Cardiovascular;  Laterality: N/A;   CESAREAN SECTION     COLONOSCOPY WITH PROPOFOL N/A 04/27/2016   Procedure: COLONOSCOPY WITH PROPOFOL;  Surgeon: ViWilford CornerMD;  [facility]  Service: Endoscopy;  Laterality: N/A;   CRANIOTOMY Left 12/03/2021   Procedure: FRONTOTEMPORAL CRANIOTOMY FOR EVACUATION SUBDURAL HEMATOMA;  Surgeon: DaKarsten RoDO;  [facility] Service: Neurosurgery;  Laterality: Left;   INTRAOPERATIVE TRANSESOPHAGEAL ECHOCARDIOGRAM  02/15/2012   Procedure: INTRAOPERATIVE TRANSESOPHAGEAL ECHOCARDIOGRAM;  Surgeon: ClRexene AlbertsMD;  [facility]  Service: Open Heart Surgery;  Laterality: N/A;   MAZE  02/15/2012   Procedure: MAZE;  Surgeon: Rexene Alberts, MD;  Location: [place];  Service: Open Heart Surgery;  Laterality: N/A;   MITRAL VALVE REPLACEMENT  02/15/2012   Procedure: MITRAL VALVE (MV) REPLACEMENT;  Surgeon: Rexene Alberts, MD;  Location: [place];  Service: Open Heart Surgery;  Laterality: N/A;   ROBOTIC ASSISTED LAPAROSCOPIC HYSTERECTOMY AND SALPINGECTOMY Bilateral 01/02/2019   Procedure: XI ROBOTIC ASSISTED LAPAROSCOPIC TOTAL HYSTERECTOMY WITH BILATERAL SALPINGOOPHORECTOMY, SENTINEL LYMPH NODE BIOPSY;  Surgeon: [name], MD;  [facility];  Service: Gynecology;  Laterality: Bilateral;   TEE WITHOUT  CARDIOVERSION  12/21/2011   Procedure: TRANSESOPHAGEAL ECHOCARDIOGRAM (TEE);  Surgeon: Jettie Booze, MD;  Location: [place];  Service: Cardiovascular;  Laterality: N/A;   TOOTH EXTRACTION N/A 11/19/2021   Procedure: DENTAL EXTRACTION TEETH NUMBER 14, 19, 30, 31;  Surgeon: Diona Browner, DMD;  Location: [place];  Service: Oral Surgery;  Laterality: N/A;   TRICUSPID VALVE REPLACEMENT  02/15/2012   Procedure: TRICUSPID VALVE REPAIR;  Surgeon: Rexene Alberts, MD;  Location: [place];  Service: Open Heart Surgery;  Laterality: N/A;   TUBAL LIGATION     at time of her c-section   Patient Active Problem List   Diagnosis Date Noted   Hypertension associated with diabetes ([place]) 12/08/2021   Anticoagulated on warfarin 12/07/2021   Acute subdural hematoma ([place]) 12/02/2021   Chronic anticoagulation - on coumadin for mechanical mitral valve, afib 12/02/2021   Subdural hematoma, nontraumatic ([place]) 12/02/2021   Pancytopenia, acquired ([place]) 06/21/2019   Peripheral neuropathy due to chemotherapy ([place]) 06/21/2019   Anemia, chronic disease 05/21/2019   Drug-induced hyperglycemia 02/25/2019   Stage 3a chronic kidney disease (CKD) ([place]) 02/04/2019   Iron deficiency anemia 01/18/2019   Endometrial cancer ([place]) 01/02/2019   Uterine cancer ([place]) 12/27/2018   Special screening for malignant neoplasms, colon 04/27/2016   LBBB (left bundle branch block) 10/07/2013   Encounter for therapeutic drug monitoring 03/19/2013   Mitral valve disorders(424.0) 10/12/2012   Heart valve replaced by other means 10/12/2012   Atrial flutter ([place])    CVA (cerebral infarction) 04/14/2012   S/P TVR (tricuspid valve repair) 03/19/2012   S/P MVR (mitral valve replacement) 03/19/2012   History of mitral valve replacement with mechanical valve 02/15/2012   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   S/P Maze operation for atrial fibrillation 02/15/2012   CHF (congestive heart failure) ([place]) 02/06/2012   MR (mitral  regurgitation) 02/06/2012   Tricuspid regurgitation 01/16/2012   Rheumatic fever 01/16/2012   Obesity (BMI 30-39.9) 01/16/2012   Mitral regurgitation    Chronic diastolic CHF (congestive heart failure) ([place]) 12/19/2011   Snoring 02/28/2011   Atrial fibrillation ([place]) 37/48/2707   Chronic systolic dysfunction of left ventricle 01/05/2011   Hypertension 10/18/2010   Type 2 diabetes mellitus ([place]) 10/18/2010   Hypercholesterolemia 10/18/2010   Mediastinal lymphadenopathy 10/18/2010   Asthma 10/18/2010    ONSET DATE: 12-02-2021   REFERRING DIAG: E67.5XAA (ICD-10-CM) - SDH (subdural hematoma) (HCC)   THERAPY DIAG:  Aphasia  Cognitive communication deficit  Rationale for Evaluation and Treatment: Rehabilitation  SUBJECTIVE:   SUBJECTIVE STATEMENT: "I was trying to say some of these words"   PERTINENT HISTORY: L-MCA 2014, dental procedure 11-19-2021 resulting in ongoing bleeding. Presented to ED 12-02-2021, imaging revealed acute on chronic SDH. While admitted began showing speech disturbances. Underwent L frontotemporal craniotomy for evacuation of hematoma 12-03-2021. Received ST services while admitted to inpatient  rehab.   PAIN:  Are you having pain? No  FALLS: Has patient fallen in last 6 months?  No  PATIENT GOALS: "do how I did before"  OBJECTIVE:   PATIENT REPORTED OUTCOME MEASURES (PROM): Deferred d/t time, will complete initial therapy session  TODAY'S TREATMENT: 02-01-22: Given phoneme, pt able to relay accurate letter and sound in 100% of trials. Reads simples words in 5/5 trials. Pt requests to work on counting, is finding self repeating numbers impacting successful counting. SLP educates on use of external aids and demonstrates use of to aid in accurate counting up to 200. Pt able to count from 1-200 with rare errors, ~95% accuracy. Self-ID 100% of trials with use of external aid. SLP demonstrates limiting visual field with pt able to carryover with mod-I PRN  during remainder of task. Occasional need for extra time to process and determine correct number. SLP provided feedback on slowing rate resulting in improved fluency during counting task.   01-27-22: SLP reviewed HEP and facilitated direct instruction for challenging items. Use of additional, similar stimuli employed to reinforce challenging phoneme/grapheme correspondence. Pt evidencing sub-optimal phoneme/grapheme correspondence at sound level which is barrier for reading and spelling. SLP provided pt with comprehensive list of orthographic representations of consonant phonemes and consonant blends to assess which require additional instruction. Of 31, pt requires model and verbal cueing to produce correct phoneme for 19. SLP provides key words, articulatory cues, and descriptive representations to reinforce target sound. Pt evidencing increased difficulty with x5 blends addressed today's session. Will continue to work on strengthening this understanding next session.   01-13-22: Target strengthening of phonemic to orthographic correspondence through use of Copy and Recall treatment, to aid in reading and spelling difficulties which are ongoing since more recent stroke. Generate core list of words to train frequent representations found when reading writing. Pt names 20/20 items with rare semantic cues. Initial attempt of spelling without support of SLP results in 15/20 targets spelled correctly. Able to generate accurate spelling of additional targets given mod-A from SLP Following, with use of visual aid, is able to write all targets accurately x3 with no instances of SLP needing to cue for correction. Following SLP verbally dictated single syllable words, with pt able to spell 15/19 correctly with occasional min to mod-A from SLP. Updated HEP with spelling/reading practice with instruction to focus on saying work out loud and listening for sounds to A with spelling.   01-06-2022: Education provided on  evaluation results and SLP's recommendations. Pt verbalizes agreement with POC, all questions answered to satisfaction.   PATIENT EDUCATION: Education details: see above Person educated: Patient Education method: Consulting civil engineer, Demonstration, Verbal cues, and Handouts Education comprehension: verbalized understanding, returned demonstration, and needs further education   GOALS: Goals reviewed with patient? Yes  SHORT TERM GOALS: Target date: 02-03-2022  Pt will successfully complete moderately complex language tasks in 90% of opportunities given occasional min-A over 2 sessions Baseline: Goal status: INITIAL  2.  Pt will write 18/20 trained words accurately with occasional min-A  Baseline:  Goal status: INITIAL  3.  During structured exercise, pt will successful demonstrate anomia strategy with rare min-A in 80% of trials  Baseline:  Goal status: INITIAL  4.  Pt will ID and attempt to correct paraphasia during structured activities in 50% of opportunities  Baseline:  Goal status: INITIAL   LONG TERM GOALS: Target date: 03-31-2022  Pt will complete HEP 5/7 days over 2 week period with min-A from family  Baseline:  Goal status: INITIAL  2.  Pt will report improvement via patient reported outcome measure by d/c Baseline: to be completed initial therapy session Goal status: INITIAL  3.  Pt will ID and attempt to correct paraphasia during structured activities in 80% of opportunities  Baseline:  Goal status: INITIAL  4.  Pt will use external support to A in reading of information on phone with mod-I following direct instruction Baseline:  Goal status: INITIAL  5.  Pt will generate approximate spelling of untrained words, efficacy or which to be judged by SLP being able to determine target in 80% of trials  Baseline:  Goal status: INITIAL   ASSESSMENT:  CLINICAL IMPRESSION: Patient is a 67 y.o. F who was seen today for cognitive linguistic evaluation. Pt presents with  moderate expressive aphasia c/b effortful speech with usual word finding, occasional paraphasias. Additional impairments of agraphia and alexia. Based on chart review, appears pt's aphasia may be close to baseline from 2014 stroke, however family is not present to confirm or provide nuanced information. Per CIR documentation, perseveration and semantic paraphasias are acute to current event. Pt endorses reading and writing deficits are newly presenting as well. Auditory comprehension relative strength and within gross functional limits per clinical observations and performance on QAB, with extended time and occasional repetitions. Verbal expression is notable for frequent pausing d/t word finding deficits, agrammatism, and reduced complexity. Is able to express basic thoughts and ideas, to include asking question, with extended time. Requires some inference from SLP, with confirmation to ensure understanding, to fully appreciate pt's intended message. Simple, one to one phonemic to orthographic representation intact for reading/writing, though laborious. Pt would benefit from skilled ST to optimize communication efficacy, reduce frustration and caregiver burden.  OBJECTIVE IMPAIRMENTS: include memory, executive functioning, receptive language, aphasia, and apraxia. These impairments are limiting patient from managing medications, managing appointments, managing finances, household responsibilities, ADLs/IADLs, and effectively communicating at home and in community. Factors affecting potential to achieve goals and functional outcome are previous level of function, severity of impairments, financial resources, and family/community support. Patient will benefit from skilled SLP services to address above impairments and improve overall function.  REHAB POTENTIAL: Fair (prior level of function s/p stroke 2014)  PLAN:  SLP FREQUENCY: 1-2x/week - SLP recommends 2x a week, pt elects to initiate therapy at 1x a week    SLP DURATION: 12 weeks  PLANNED INTERVENTIONS: Language facilitation, Cueing hierachy, Cognitive reorganization, Internal/external aids, Functional tasks, Multimodal communication approach, SLP instruction and feedback, Compensatory strategies, and Patient/family education  Su Monks, CCC-SLP 02/01/2022, 2:47 PM

## 2022-02-01 NOTE — Therapy (Signed)
OUTPATIENT PHYSICAL THERAPY NEURO TREATMENT   Patient Name: [name] MRN: [id number] DOB:1955/11/15, 67 y.o., female Today's Date: 02/01/2022   PCP: Lennie Odor, PA REFERRING PROVIDER: Cathlyn Parsons, PA-C  END OF SESSION:  PT End of Session - 02/01/22 1400     Visit Number 4    Number of Visits 7    Date for PT Re-Evaluation 03/03/22    Authorization Type [employer]    PT Start Time 1400    PT Stop Time 2536    PT Time Calculation (min) 41 min    Equipment Utilized During Treatment Gait belt    Activity Tolerance Patient tolerated treatment well    Behavior During Therapy WFL for tasks assessed/performed             Past Medical History:  Diagnosis Date   Abnormal chest CT    Anemia    Aortic atherosclerosis (HCC)    Asthma    Atrial enlargement, left    severe   Atrial fibrillation ([place]) 01/05/2011   Chronic persistent, failed DCCV    Atrial flutter ([place])    Bronchospasm 1998   Cardiomegaly    Carotid bruit    Chronic diastolic congestive heart failure ([place])    Diabetes mellitus    Type II   Ejection fraction < 50%    35-40%,    Heart murmur    History of blood transfusion    History of radiation therapy 03/18/19-04/15/19   endometrial - vaginal brachytherapy -  Dr. Sondra Come    Hypercholesterolemia    Hypertension    Junctional rhythm 09/14/2018   Noted on EKG   Left bundle branch block (LBBB) 09/14/2018   Noted on EKG   LVH (left ventricular hypertrophy) 10/10/2016   Moderate, Noted on ECHO   Mitral regurgitation    Obesity    Obesity (BMI 30-39.9) 01/16/2012   Persistent atrial fibrillation ([place])    Polyp of rectum    Rheumatic fever 01/16/2012   Reported during childhood   S/P Maze operation for atrial fibrillation 02/15/2012   Complete biatrial lesion set using bipolar radiofrequency and cryothermy ablation with clipping of LA appendage   S/P mitral valve replacement with metallic valve 64/40/3474   80m [employer]  Optiform mechanical prosthesis   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   246mEdwards mc3 ring annuloplasty   Shortness of breath    with exertion   Sleep apnea    DOES NOT HAVE CPAP   [place]   Post op , left arm weakness   Tricuspid regurgitation 01/16/2012   [facility]   Past Surgical History:  Procedure Laterality Date   CARDIAC CATHETERIZATION  >5 years   CARDIOVASCULAR STRESS TEST  09/2010   CARDIOVERSION  03/25/2011   Procedure: CARDIOVERSION;  Surgeon: JaJettie BoozeMD;  [facility] Service: Cardiovascular;  Laterality: N/A;   CARDIOVERSION N/A 07/27/2012   Procedure: CARDIOVERSION;  Surgeon: JaJettie BoozeMD;  [place] Service: Cardiovascular;  Laterality: N/A;   CESAREAN SECTION     COLONOSCOPY WITH PROPOFOL N/A 04/27/2016   Procedure: COLONOSCOPY WITH PROPOFOL;  Surgeon: ViWilford CornerMD;  [facility];  Service: Endoscopy;  Laterality: N/A;   CRANIOTOMY Left 12/03/2021   Procedure: FRONTOTEMPORAL CRANIOTOMY FOR EVACUATION SUBDURAL HEMATOMA;  Surgeon: DaKarsten RoDO;  [facility] Service: Neurosurgery;  Laterality: Left;   INTRAOPERATIVE TRANSESOPHAGEAL ECHOCARDIOGRAM  02/15/2012   Procedure: INTRAOPERATIVE TRANSESOPHAGEAL ECHOCARDIOGRAM;  Surgeon: Rexene Alberts, MD;  Location: [place];  Service: Open Heart Surgery;  Laterality: N/A;   MAZE  02/15/2012   Procedure: MAZE;  Surgeon: Rexene Alberts, MD;  Location: [place];  Service: Open Heart Surgery;  Laterality: N/A;   MITRAL VALVE REPLACEMENT  02/15/2012   Procedure: MITRAL VALVE (MV) REPLACEMENT;  Surgeon: Rexene Alberts, MD;  Location: [place];  Service: Open Heart Surgery;  Laterality: N/A;   ROBOTIC ASSISTED LAPAROSCOPIC HYSTERECTOMY AND SALPINGECTOMY Bilateral 01/02/2019   Procedure: XI ROBOTIC ASSISTED LAPAROSCOPIC TOTAL HYSTERECTOMY WITH BILATERAL SALPINGOOPHORECTOMY, SENTINEL LYMPH NODE BIOPSY;  Surgeon: [name], MD;  [facility];  Service: Gynecology;  Laterality: Bilateral;   TEE WITHOUT CARDIOVERSION  12/21/2011   Procedure: TRANSESOPHAGEAL ECHOCARDIOGRAM (TEE);  Surgeon: Jettie Booze, MD;  Location: [place];  Service: Cardiovascular;  Laterality: N/A;   TOOTH EXTRACTION N/A 11/19/2021   Procedure: DENTAL EXTRACTION TEETH NUMBER 14, 19, 30, 31;  Surgeon: Diona Browner, DMD;  Location: [place];  Service: Oral Surgery;  Laterality: N/A;   TRICUSPID VALVE REPLACEMENT  02/15/2012   Procedure: TRICUSPID VALVE REPAIR;  Surgeon: Rexene Alberts, MD;  Location: [place];  Service: Open Heart Surgery;  Laterality: N/A;   TUBAL LIGATION     at time of her c-section   Patient Active Problem List   Diagnosis Date Noted   Hypertension associated with diabetes ([place]) 12/08/2021   Anticoagulated on warfarin 12/07/2021   Acute subdural hematoma ([place]) 12/02/2021   Chronic anticoagulation - on coumadin for mechanical mitral valve, afib 12/02/2021   Subdural hematoma, nontraumatic ([place]) 12/02/2021   Pancytopenia, acquired ([place]) 06/21/2019   Peripheral neuropathy due to chemotherapy ([place]) 06/21/2019   Anemia, chronic disease 05/21/2019   Drug-induced hyperglycemia 02/25/2019   Stage 3a chronic kidney disease (CKD) ([place]) 02/04/2019   Iron deficiency anemia 01/18/2019   Endometrial cancer ([place]) 01/02/2019   Uterine cancer ([place]) 12/27/2018   Special screening for malignant neoplasms, colon 04/27/2016   LBBB (left bundle branch block) 10/07/2013   Encounter for therapeutic drug monitoring 03/19/2013   Mitral valve disorders(424.0) 10/12/2012   Heart valve replaced by other means 10/12/2012   Atrial flutter ([place])    CVA (cerebral infarction) 04/14/2012   S/P TVR (tricuspid valve repair) 03/19/2012   S/P MVR (mitral valve replacement) 03/19/2012   History of mitral valve replacement with mechanical valve 02/15/2012   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   S/P Maze operation for atrial fibrillation 02/15/2012   CHF  (congestive heart failure) ([place]) 02/06/2012   MR (mitral regurgitation) 02/06/2012   Tricuspid regurgitation 01/16/2012   Rheumatic fever 01/16/2012   Obesity (BMI 30-39.9) 01/16/2012   Mitral regurgitation    Chronic diastolic CHF (congestive heart failure) ([place]) 12/19/2011   Snoring 02/28/2011   Atrial fibrillation ([place]) 25/36/6440   Chronic systolic dysfunction of left ventricle 01/05/2011   Hypertension 10/18/2010   Type 2 diabetes mellitus ([place]) 10/18/2010   Hypercholesterolemia 10/18/2010   Mediastinal lymphadenopathy 10/18/2010   Asthma 10/18/2010    ONSET DATE: 12/15/2021 (referral date)  REFERRING DIAG: S06.5XAA (ICD-10-CM) - SDH (subdural hematoma) (HCC)  THERAPY DIAG:  Unsteadiness on feet  Muscle weakness (generalized)  Abnormality of gait and mobility  Other symptoms and signs involving the musculoskeletal system  Rationale for Evaluation and Treatment: Rehabilitation  SUBJECTIVE:  SUBJECTIVE STATEMENT:  Patient reports no new falls; she was doing okay after last session. She reports that she wants to work on walking around clinic today. Otherwise is doing well.   Pt accompanied by: self  PERTINENT HISTORY:   Per Note on 12/10/2021:  "Brief HPI:   DELMA DRONE is a 67 y.o. female with history of CHF, OSA, CKD, MVR-chronic Coumadin, left MCA stroke 2014, recent dental extraction who was evaluated in the ED 12/02/21 due to issues with ongoing bleeding since her procedure.  She will did report headaches which was followed by somnolence with confusion and CT of head done showing acute on chronic SDH with 16 mm shift to the left, 8 mm left to right midline shift and effacement of left greater than right psych eye.  INR at admission was 2.4 and was reversed with Kcentra.  Dr. Reatha Armour  was consulted and she underwent left frontotemporal craniotomy for evacuation of hematoma on 11/24.  She was started on Keppra for seizure prophylaxis and cleared to start IV heparin on 11/27.  Recommendations were to repeat CT once heparin was therapeutic prior to transition to oral [facility]."  Patient transferred to [facility] on 12/10/2021 per notes and saw all three disciplines. Finished skilled physical therapy in that setting on 12/15/2021.  PAIN:  Are you having pain? No  PRECAUTIONS: Fall and ICD/Pacemaker  WEIGHT BEARING RESTRICTIONS: No  FALLS: Has patient fallen in last 6 months?  Patient reports no falls in last 6 months  PATIENT GOALS: "I want my speech to be better. My family want me to work on my safety."  OBJECTIVE:   DIAGNOSTIC FINDINGS:  12/08/2021 CT Head w/o Contrast: "IMPRESSION: 1. Mixed density subdural hematoma on the left is unchanged. Mass-effect with 4 mm midline shift to the right unchanged. 2. Small right temporal subdural hematoma unchanged. 3. No new area of hemorrhage."  Vitals:   02/01/22 1405  BP: 125/83  Pulse: 74    TODAY'S TREATMENT:                                                                                                                               NMR: - Blaze pod tapping on compliant surface and elevated 8" step without AD + CGA and multidirectional steps in between 3 x 60" (required seated break between 2nd and 3rd set)  -Alt UE to LE taps on compliant surface with CGA 2 x 8  Gait: - Attempted 6MWT: patient tolerted 3.5 minutes with 500 feet of ambulation around multiple obstacles in gym w/o AD (SPO2: 96%) - 10 MWT x 6 for speed: 8.39" - 8.8" or 1.19 m/s - 1.14 m/s (SBA w/o AD)   There Act: FxSTS without UE use (SBA): Trial 1: 25.3" Trial 2: 20.8" Trial 3: 16.86"  PATIENT EDUCATION: Education details: Continue HEP, trial short bouts of fast walking on even ground in areas that are not busy and are familiar Person  educated: Patient Education method: Theatre stage manager Education comprehension: verbalized understanding and needs further education  HOME EXERCISE PROGRAM: Access Code: [email or web address] URL: [email or web address] Date: 01/13/2022 Prepared by: Malachi Carl  Exercises - Sit to Stand Without Arm Support  - 1 x daily - 7 x weekly - 3 sets - 5 reps - Corner Balance Feet Together: Eyes Closed With Head Turns  - 1 x daily - 7 x weekly - 3 sets - 10 reps - Standing 3-Way Kick  - 1 x daily - 7 x weekly - 3 sets - 10 reps - Standing Marching  - 1 x daily - 7 x weekly - 3 sets - 10 reps  KB Home	[place] 6 Week Beginner Walking Program - pdf provided (increase gait speed)  GOALS: Goals reviewed with patient? Yes  LONG TERM GOALS: Target date: 03/03/2022 (LTG only given length of POC)  Patient will report demonstrate independence with final HEP in order to maintain current gains and continue to progress after physical therapy discharge.   Baseline: To be provided Goal status: INITIAL  2.  Patient will improve gait speed to 1.1 m/s or greater with LRAD to indicate a reduced risk for falls.   Baseline: 0.81 m/s Goal status: INITIAL  3.  Patient will improve their 5x Sit to Stand score to less than 15 seconds to demonstrate a decreased risk for falls and improved LE strength.   Baseline: 21.76" with decreased eccentric control to sit and mild LOB on final sit to stand and able to recover independently; improved to 16.86" w/o UE and no loss of balance on 02/01/2022 Goal status: IN PROGRESS  4.  Patient will improve ABC Score to 67% or greater to indicate a decreased risk for falls and improved self-reported confidence in balance and sense of steadiness.   Baseline: 61.3% Goal status: INITIAL  5.  Patient will improve 2MWT with LRAD to 155.2 m or greater to achieve age reported norms and demonstrate improved cardiorespiratory endurance and mobility. Baseline: 89.73mGoal status:  INITIAL   ASSESSMENT:  CLINICAL IMPRESSION: Session emphasized lower extremity strengthening, gait speed, and continued progression on compliant surface. Patient unable to tolerate full 6MWT but demonstrated good navigation around obstacles in gym. Patient also demonstrated tolerance for rapid gait speed above cutoff for falls of 1.1 m/s x 6 trials in today's session. Patient also progressing well on 5xSTS, with no signs of imbalance noted and improvement to 16.86" nearing cutoff for falls score of 15". Patient will benefit from skilled physical therapy services to continue to progress mobility and decrease risk for falls.   OBJECTIVE IMPAIRMENTS: Abnormal gait, decreased balance, decreased knowledge of use of DME, decreased mobility, difficulty walking, decreased strength, decreased safety awareness, impaired sensation, and impaired tone.   ACTIVITY LIMITATIONS: standing, squatting, and caring for others  PARTICIPATION LIMITATIONS: driving and community activity  PERSONAL FACTORS: Age, Past/current experiences, and 1-2 comorbidities: see above  are also affecting patient's functional outcome.   REHAB POTENTIAL: Good  CLINICAL DECISION MAKING: Evolving/moderate complexity  EVALUATION COMPLEXITY: Moderate  PLAN:  PT FREQUENCY: 1x/week  PT DURATION: 8 weeks  PLANNED INTERVENTIONS: Therapeutic exercises, Therapeutic activity, Neuromuscular re-education, Gait training, Self Care, and Manual therapy  PLAN FOR NEXT SESSION: Dynamic gait tasks, sit to stands for speed, gait speed, review HEP + walking program progress; fall recovery; patient enjoyed use of blaze pods; curb navigation  SMalachi Carl PT, DPT  02/01/2022, 4:11 PM

## 2022-02-02 ENCOUNTER — Ambulatory Visit: Payer: Medicare HMO | Admitting: Diagnostic Neuroimaging

## 2022-02-02 ENCOUNTER — Encounter: Payer: Self-pay | Admitting: Diagnostic Neuroimaging

## 2022-02-02 VITALS — BP 109/64 | HR 59 | Ht 66.0 in | Wt 218.0 lb

## 2022-02-02 DIAGNOSIS — S065XAA Traumatic subdural hemorrhage with loss of consciousness status unknown, initial encounter: Secondary | ICD-10-CM | POA: Diagnosis not present

## 2022-02-02 DIAGNOSIS — G40109 Localization-related (focal) (partial) symptomatic epilepsy and epileptic syndromes with simple partial seizures, not intractable, without status epilepticus: Secondary | ICD-10-CM

## 2022-02-02 MED ORDER — LEVETIRACETAM 500 MG PO TABS: 500.0000 mg | ORAL_TABLET | Freq: Two times a day (BID) | ORAL | 4 refills | Status: DC

## 2022-02-02 NOTE — Patient Instructions (Signed)
LEFT SUBDURAL HEMATOMA (s/p  burr hole evacuation) - abnl spells of aphasia; possible partial seizures; abnl EEG  - recommend to continue levetiracetam '500mg'$  twice a day   - According to [place] law, you can not drive unless you are seizure / syncope free for at least 6 months and under physician's care.   - Please maintain precautions. Do not participate in activities where a loss of awareness could harm you or someone else. No swimming alone, no tub bathing, no hot tubs, no driving, no operating motorized vehicles (cars, ATVs, motocycles, etc), lawnmowers, power tools or firearms. No standing at heights, such as rooftops, ladders or stairs. Avoid hot objects such as stoves, heaters, open fires. Wear a helmet when riding a bicycle, scooter, skateboard, etc. and avoid areas of traffic. Set your water heater to 120 degrees or less.

## 2022-02-02 NOTE — Progress Notes (Unsigned)
[facility]  PATIENT: OLIVIANNA [name] DOB: [birth year]  REFERRING CLINICIAN: Bary Leriche, PA-C HISTORY FROM: patient and daughter REASON FOR VISIT: new consult   HISTORICAL  CHIEF COMPLAINT:  Chief Complaint  Patient presents with   Follow-up    Rm 6 with daughter Tomeka  Pt is well and stable, reports no residual side affects     HISTORY OF PRESENT ILLNESS:   67 year old female here for evaluation of subdural hematoma and seizure risk.  Patient presented to hospital in November 2023 after having excessive bleeding status post dental procedure and resuming anticoagulation.  She was also having some somnolence and confusion.  She went to the hospital for evaluation.  She was found to have left greater than right subdural hematomas.  These were treated surgically.  She was also having some episodes of aphasia which are concerning for seizure.  She had abnormal EEG and therefore levetiracetam was started.  She was stabilized transferred to inpatient rehabilitation.  Now she is back home.  Overall she is doing well.  No recurrent spells or seizures.  Tolerating medication.   REVIEW OF SYSTEMS: Full 14 system review of systems performed and negative with exception of: as per HPI.  ALLERGIES: Allergies  Allergen Reactions   Chlorhexidine Other (See Comments)    Unknown    HOME MEDICATIONS: Outpatient Medications Prior to Visit  Medication Sig Dispense Refill   acetaminophen (TYLENOL) 325 MG tablet Take 1-2 tablets (325-650 mg total) by mouth every 4 (four) hours as needed for mild pain.     amiodarone (PACERONE) 200 MG tablet TAKE 1 TABLET(200 MG) BY MOUTH DAILY 30 tablet 0   amLODipine (NORVASC) 10 MG tablet TAKE 1 TABLET EVERY DAY 90 tablet 3   atorvastatin (LIPITOR) 40 MG tablet TAKE 1 TABLET(40 MG) BY MOUTH DAILY (Patient taking differently: Take 40 mg by mouth daily.) 90 tablet 2   carvedilol (COREG) 12.5 MG tablet Take 1 tablet (12.5 mg total) by mouth  2 (two) times daily with a meal. 60 tablet 0   cephALEXin (KEFLEX) 500 MG capsule Take 1 capsule (500 mg total) by mouth 3 (three) times daily. 20 capsule 0   docusate sodium (COLACE) 100 MG capsule Take 1 capsule (100 mg total) by mouth 2 (two) times daily. 60 capsule 0   irbesartan (AVAPRO) 300 MG tablet TAKE 1 TABLET EVERY DAY 90 tablet 3   metFORMIN (GLUCOPHAGE) 500 MG tablet Take 0.5 tablets (250 mg total) by mouth 2 (two) times daily with a meal. 60 tablet 0   pantoprazole (PROTONIX) 40 MG tablet Take 1 tablet (40 mg total) by mouth daily. 30 tablet 0   polyethylene glycol powder (GLYCOLAX/MIRALAX) 17 GM/SCOOP powder Take 17 g by mouth daily. 238 g 0   warfarin (COUMADIN) 4 MG tablet TAKE 1 AND 1/2 TABLETS with supper starting Friday. Appointment with coumadin clinic on Monday for further instructions. 180 tablet 1   levETIRAcetam (KEPPRA) 500 MG tablet Take 1 tablet (500 mg total) by mouth 2 (two) times daily. 60 tablet 0   No facility-administered medications prior to visit.    PAST MEDICAL HISTORY: Past Medical History:  Diagnosis Date   Abnormal chest CT    Anemia    Aortic atherosclerosis (HCC)    Asthma    Atrial enlargement, left    severe   Atrial fibrillation ([place]) 01/05/2011   Chronic persistent, failed DCCV    Atrial flutter ([place])    Bronchospasm 1998   Cardiomegaly  Carotid bruit    Chronic diastolic congestive heart failure (HCC)    Diabetes mellitus    Type II   Ejection fraction < 50%    35-40%,    Heart murmur    History of blood transfusion    History of radiation therapy 03/18/19-04/15/19   endometrial - vaginal brachytherapy -  Dr. Sondra Come    Hypercholesterolemia    Hypertension    Junctional rhythm 09/14/2018   Noted on EKG   Left bundle branch block (LBBB) 09/14/2018   Noted on EKG   LVH (left ventricular hypertrophy) 10/10/2016   Moderate, Noted on ECHO   Mitral regurgitation    Obesity    Obesity (BMI 30-39.9) 01/16/2012   Persistent  atrial fibrillation ([place])    Polyp of rectum    Rheumatic fever 01/16/2012   Reported during childhood   S/P Maze operation for atrial fibrillation 02/15/2012   Complete biatrial lesion set using bipolar radiofrequency and cryothermy ablation with clipping of LA appendage   S/P mitral valve replacement with metallic valve 12/87/8676   32m Sorin [employer] Optiform mechanical prosthesis   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   241mEdwards mc3 ring annuloplasty   Shortness of breath    with exertion   Sleep apnea    DOES NOT HAVE CPAP   Stroke ([facility]   Post op , left arm weakness   Tricuspid regurgitation 01/16/2012   Uterine cancer [place]    PAST SURGICAL HISTORY: Past Surgical History:  Procedure Laterality Date   CARDIAC CATHETERIZATION  >5 years   CARDIOVASCULAR STRESS TEST  09/2010   CARDIOVERSION  03/25/2011   Procedure: CARDIOVERSION;  Surgeon: JaJettie BoozeMD;  [facility] Service: Cardiovascular;  Laterality: N/A;   CARDIOVERSION N/A 07/27/2012   Procedure: CARDIOVERSION;  Surgeon: JaJettie BoozeMD;  Location: [facility] Service: Cardiovascular;  Laterality: N/A;   CESAREAN SECTION     COLONOSCOPY WITH PROPOFOL N/A 04/27/2016   Procedure: COLONOSCOPY WITH PROPOFOL;  Surgeon: ViWilford CornerMD;  [facility];  Service: Endoscopy;  Laterality: N/A;   CRANIOTOMY Left 12/03/2021   Procedure: FRONTOTEMPORAL CRANIOTOMY FOR EVACUATION SUBDURAL HEMATOMA;  Surgeon: DaKarsten RoDO;  [facility] Service: Neurosurgery;  Laterality: Left;   INTRAOPERATIVE TRANSESOPHAGEAL ECHOCARDIOGRAM  02/15/2012   Procedure: INTRAOPERATIVE TRANSESOPHAGEAL ECHOCARDIOGRAM;  Surgeon: ClRexene AlbertsMD;  Location: [facility] Service: Open Heart Surgery;  Laterality: N/A;   MAZE  02/15/2012   Procedure: MAZE;  Surgeon: ClRexene AlbertsMD;  [facility] Service: Open Heart Surgery;  Laterality: N/A;   MITRAL VALVE REPLACEMENT  02/15/2012   Procedure:  MITRAL VALVE (MV) REPLACEMENT;  Surgeon: ClRexene AlbertsMD;  [facility] Service: Open Heart Surgery;  Laterality: N/A;   ROBOTIC ASSISTED LAPAROSCOPIC HYSTERECTOMY AND SALPINGECTOMY Bilateral 01/02/2019   Procedure: XI ROBOTIC ASSISTED LAPAROSCOPIC TOTAL HYSTERECTOMY WITH BILATERAL SALPINGOOPHORECTOMY, SENTINEL LYMPH NODE BIOPSY;  Surgeon: TuLafonda MossesMD;  Location: [facility];  Service: Gynecology;  Laterality: Bilateral;   TEE WITHOUT CARDIOVERSION  12/21/2011   Procedure: TRANSESOPHAGEAL ECHOCARDIOGRAM (TEE);  Surgeon: JaJettie BoozeMD;  Location: [facility] Service: Cardiovascular;  Laterality: N/A;   TOOTH EXTRACTION N/A 11/19/2021   Procedure: DENTAL EXTRACTION TEETH NUMBER 14, 19, 30, 31;  Surgeon: JeDiona BrownerDMD;  [facility]: Oral Surgery;  Laterality: N/A;   TRICUSPID VALVE REPLACEMENT  02/15/2012   Procedure: TRICUSPID VALVE REPAIR;  Surgeon: ClBraulio Conte  Keturah Barre, MD;  Location: [place];  Service: Open Heart Surgery;  Laterality: N/A;   TUBAL LIGATION     at time of her c-section    FAMILY HISTORY: Family History  Problem Relation Age of Onset   Asthma Mother    Diabetes Mother    Hypertension Mother    Hypertension Brother    Heart attack Neg Hx    Stroke Neg Hx    Colon cancer Neg Hx    Colon polyps Neg Hx    Liver disease Neg Hx    Uterine cancer Neg Hx    Ovarian cancer Neg Hx    Breast cancer Neg Hx     SOCIAL HISTORY: Social History   Socioeconomic History   Marital status: Married    Spouse name: Not on file   Number of children: Not on file   Years of education: Not on file   Highest education level: Not on file  Occupational History   Occupation: Conservation officer, nature    Employer: [employer]  Tobacco Use   Smoking status: Never   Smokeless tobacco: Never  Vaping Use   Vaping Use: Never used  Substance and Sexual Activity   Alcohol use: No   Drug use: No   Sexual activity: Yes    Birth control/protection:  Post-menopausal  Other Topics Concern   Not on file  Social History Narrative   Pt lives in [place] with spouse.  Works at [employer]. 2 grown children, 1 grandchild   Social Determinants of Radio broadcast assistant Strain: Not on [employer] Insecurity: Not on file  Transportation Needs: Unknown (12/02/2021)   PRAPARE - Hydrologist (Medical): Patient refused    Lack of Transportation (Non-Medical): Patient refused  Physical Activity: Not on file  Stress: Not on file  Social Connections: Not on file  Intimate Partner Violence: Unknown (12/02/2021)   Humiliation, Afraid, Rape, and Kick questionnaire    Fear of Current or Ex-Partner: Patient refused    Emotionally Abused: Patient refused    Physically Abused: Patient refused    Sexually Abused: Patient refused     PHYSICAL EXAM  GENERAL EXAM/CONSTITUTIONAL: Vitals:  Vitals:   02/02/22 0912  BP: 109/64  Pulse: (!) 59  Weight: 218 lb (98.9 kg)  Height: '5\' 6"'$  (1.676 m)   Body mass index is 35.19 kg/m. Wt Readings from Last 3 Encounters:  02/02/22 218 lb (98.9 kg)  01/25/22 219 lb (99.3 kg)  01/18/22 215 lb (97.5 kg)   Patient is in no distress; well developed, nourished and groomed; neck is supple  CARDIOVASCULAR: Examination of carotid arteries is normal; no carotid bruits Regular rate and rhythm, no murmurs Examination of peripheral vascular system by observation and palpation is normal  EYES: Ophthalmoscopic exam of optic discs and posterior segments is normal; no papilledema or hemorrhages No results found.  MUSCULOSKELETAL: Gait, strength, tone, movements noted in Neurologic exam below  NEUROLOGIC: MENTAL STATUS:      No data to display         awake, alert, oriented to person, place and time recent and remote memory intact normal attention and concentration language fluent, comprehension intact, naming intact fund of knowledge appropriate  CRANIAL  NERVE:  2nd - no papilledema on fundoscopic exam 2nd, 3rd, 4th, 6th - pupils equal and reactive to light, visual fields full to confrontation, extraocular muscles intact, no nystagmus 5th - facial sensation symmetric 7th - facial strength  symmetric 8th - hearing intact 9th - palate elevates symmetrically, uvula midline 11th - shoulder shrug symmetric 12th - tongue protrusion midline  MOTOR:  normal bulk and tone, full strength in the BUE, BLE  SENSORY:  normal and symmetric to light touch, temperature, vibration  COORDINATION:  finger-nose-finger, fine finger movements normal  REFLEXES:  deep tendon reflexes TRACE  and symmetric  GAIT/STATION:  narrow based gait; USING CANE     DIAGNOSTIC DATA (LABS, IMAGING, TESTING) - I reviewed patient records, labs, notes, testing and imaging myself where available.  Lab Results  Component Value Date   WBC 5.3 01/18/2022   HGB 11.2 (L) 01/18/2022   HCT 33.0 (L) 01/18/2022   MCV 98.1 01/18/2022   PLT 121 (L) 01/18/2022      Component Value Date/Time   NA 139 01/18/2022 0143   NA 140 08/10/2021 0838   K 4.1 01/18/2022 0143   CL 101 01/18/2022 0143   CO2 24 01/18/2022 0100   GLUCOSE 144 (H) 01/18/2022 0143   BUN 17 01/18/2022 0143   BUN 19 08/10/2021 0838   CREATININE 1.00 01/18/2022 0143   CREATININE 1.02 (H) 06/20/2019 0759   CREATININE 0.67 05/28/2015 0958   CALCIUM 8.7 (L) 01/18/2022 0100   PROT 7.4 01/18/2022 0100   PROT 8.4 08/10/2021 0838   ALBUMIN 3.6 01/18/2022 0100   ALBUMIN 4.6 08/10/2021 0838   AST 31 01/18/2022 0100   AST 20 06/20/2019 0759   ALT 22 01/18/2022 0100   ALT 13 06/20/2019 0759   ALKPHOS 56 01/18/2022 0100   BILITOT 1.0 01/18/2022 0100   BILITOT 0.5 08/10/2021 0838   BILITOT 0.5 06/20/2019 0759   GFRNONAA 54 (L) 01/18/2022 0100   GFRNONAA 58 (L) 06/20/2019 0759   GFRAA >60 06/20/2019 0759   Lab Results  Component Value Date   CHOL 166 08/10/2021   HDL 35 (L) 08/10/2021   LDLCALC 111  (H) 08/10/2021   TRIG 110 08/10/2021   CHOLHDL 4.7 (H) 08/10/2021   Lab Results  Component Value Date   HGBA1C 5.8 (H) 12/02/2021   No results found for: "VITAMINB12" Lab Results  Component Value Date   TSH 2.940 10/03/2016    12/02/21 CT head - Bilateral mixed density subdural collections, most likely acute on chronic hemorrhage, measuring up to 16 mm on the left and 6 mm on the right, with 8 mm left to right midline shift and effacement of the left-greater-than-right sulci.   12/08/21 EEG ABNORMALITY - Sharp wave, left frontal region - Continuous slow, generalized and lateralized left hemisphere    IMPRESSION: This study showed evidence of epileptogenicity arising from left frontal region. Additionally there is cortical dysfunction arising from left hemisphere likely secondary to underlying structural abnormality. Lastly there is  moderate diffuse encephalopathy, nonspecific etiology. No seizures were seen throughout the recording.   01/18/22 MRI brain 1. No acute infarct identified. 2. Small Left (3 mm) and trace Right side Subdural Hematomas. No significant intracranial mass effect. Previous left side craniotomy. 3. Mild to moderate for age underlying chronic small vessel disease, including occasional chronic micro hemorrhages.    ASSESSMENT AND PLAN  67 y.o. year old female here with:  Dx:  1. Subdural hematoma (HCC)   2. Partial symptomatic epilepsy with simple partial seizures, not intractable, without status epilepticus ([place])     PLAN:  LEFT SUBDURAL HEMATOMA (12/02/21; s/p fall on anticoagulation; s/p  burr hole evacuation --> abnl spells of aphasia; possible partial seizures; abnl EEG)  - recommend to  continue levetiracetam '500mg'$  twice a day   - According to [place] law, you can not drive unless you are seizure / syncope free for at least 6 months and under physician's care.   - Please maintain precautions. Do not participate in activities where a loss of  awareness could harm you or someone else. No swimming alone, no tub bathing, no hot tubs, no driving, no operating motorized vehicles (cars, ATVs, motocycles, etc), lawnmowers, power tools or firearms. No standing at heights, such as rooftops, ladders or stairs. Avoid hot objects such as stoves, heaters, open fires. Wear a helmet when riding a bicycle, scooter, skateboard, etc. and avoid areas of traffic. Set your water heater to 120 degrees or less.   Meds ordered this encounter  Medications   levETIRAcetam (KEPPRA) 500 MG tablet    Sig: Take 1 tablet (500 mg total) by mouth 2 (two) times daily.    Dispense:  180 tablet    Refill:  4   Return in about 1 year (around 02/03/2023).    Penni Bombard, MD 9/31/1216, 24:46 AM Certified in Neurology, Neurophysiology and Neuroimaging  [facility] [address] [place], [place] [address] [phone]

## 2022-02-10 ENCOUNTER — Encounter: Payer: Self-pay | Admitting: Occupational Therapy

## 2022-02-10 ENCOUNTER — Encounter: Payer: Self-pay | Admitting: Physical Therapy

## 2022-02-10 ENCOUNTER — Ambulatory Visit: Payer: Medicare HMO | Attending: Physician Assistant | Admitting: Physical Therapy

## 2022-02-10 ENCOUNTER — Ambulatory Visit: Payer: Medicare HMO | Admitting: Occupational Therapy

## 2022-02-10 ENCOUNTER — Ambulatory Visit: Payer: Medicare HMO | Admitting: Speech Pathology

## 2022-02-10 VITALS — BP 115/57 | HR 51

## 2022-02-10 DIAGNOSIS — R41841 Cognitive communication deficit: Secondary | ICD-10-CM | POA: Insufficient documentation

## 2022-02-10 DIAGNOSIS — R4701 Aphasia: Secondary | ICD-10-CM

## 2022-02-10 DIAGNOSIS — I6200 Nontraumatic subdural hemorrhage, unspecified: Secondary | ICD-10-CM | POA: Diagnosis not present

## 2022-02-10 DIAGNOSIS — R29898 Other symptoms and signs involving the musculoskeletal system: Secondary | ICD-10-CM | POA: Diagnosis not present

## 2022-02-10 DIAGNOSIS — M6281 Muscle weakness (generalized): Secondary | ICD-10-CM

## 2022-02-10 DIAGNOSIS — R269 Unspecified abnormalities of gait and mobility: Secondary | ICD-10-CM | POA: Insufficient documentation

## 2022-02-10 DIAGNOSIS — I4819 Other persistent atrial fibrillation: Secondary | ICD-10-CM

## 2022-02-10 DIAGNOSIS — R2681 Unsteadiness on feet: Secondary | ICD-10-CM | POA: Insufficient documentation

## 2022-02-10 NOTE — Therapy (Signed)
OUTPATIENT PHYSICAL THERAPY NEURO TREATMENT   Patient Name: [name] MRN: [id number] DOB:[birth year], 67 y.o., female Today's Date: 02/10/2022   PCP: Lennie Odor, [place] REFERRING PROVIDER: Cathlyn Parsons, PA-C  END OF SESSION:  PT End of Session - 02/10/22 0948     Visit Number 5    Number of Visits 7    Date for PT Re-Evaluation 03/03/22    Authorization Type [employer]    PT Start Time 1100    PT Stop Time 1149    PT Time Calculation (min) 49 min    Equipment Utilized During Treatment Gait belt    Activity Tolerance Patient tolerated treatment well    Behavior During Therapy WFL for tasks assessed/performed             Past Medical History:  Diagnosis Date   Abnormal chest CT    Anemia    Aortic atherosclerosis (HCC)    Asthma    Atrial enlargement, left    severe   Atrial fibrillation ([place]) 01/05/2011   Chronic persistent, failed DCCV    Atrial flutter ([place])    Bronchospasm 1998   Cardiomegaly    Carotid bruit    Chronic diastolic congestive heart failure ([place])    Diabetes mellitus    Type II   Ejection fraction < 50%    35-40%,    Heart murmur    History of blood transfusion    History of radiation therapy 03/18/19-04/15/19   endometrial - vaginal brachytherapy -  Dr. Sondra Come    Hypercholesterolemia    Hypertension    Junctional rhythm 09/14/2018   Noted on EKG   Left bundle branch block (LBBB) 09/14/2018   Noted on EKG   LVH (left ventricular hypertrophy) 10/10/2016   Moderate, Noted on ECHO   Mitral regurgitation    Obesity    Obesity (BMI 30-39.9) 01/16/2012   Persistent atrial fibrillation ([place])    Polyp of rectum    Rheumatic fever 01/16/2012   Reported during childhood   S/P Maze operation for atrial fibrillation 02/15/2012   Complete biatrial lesion set using bipolar radiofrequency and cryothermy ablation with clipping of LA appendage   S/P mitral valve replacement with metallic valve 40/98/1191   65m Sorin [employer]  Optiform mechanical prosthesis   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   230mEdwards mc3 ring annuloplasty   Shortness of breath    with exertion   Sleep apnea    DOES NOT HAVE CPAP   Stroke ([place]   Post op , left arm weakness   Tricuspid regurgitation 01/16/2012   [facility]   Past Surgical History:  Procedure Laterality Date   CARDIAC CATHETERIZATION  >5 years   CARDIOVASCULAR STRESS TEST  09/2010   CARDIOVERSION  03/25/2011   Procedure: CARDIOVERSION;  Surgeon: JaJettie BoozeMD;  [facility]: [facility] Service: Cardiovascular;  Laterality: N/A;   CARDIOVERSION N/A 07/27/2012   Procedure: CARDIOVERSION;  Surgeon: JaJettie BoozeMD;  Location: [facility] Service: Cardiovascular;  Laterality: N/A;   CESAREAN SECTION     COLONOSCOPY WITH PROPOFOL N/A 04/27/2016   Procedure: COLONOSCOPY WITH PROPOFOL;  Surgeon: ViWilford CornerMD;  [facility];  Service: Endoscopy;  Laterality: N/A;   CRANIOTOMY Left 12/03/2021   Procedure: FRONTOTEMPORAL CRANIOTOMY FOR EVACUATION SUBDURAL HEMATOMA;  Surgeon: DaKarsten RoDO;  Location: [facility] Service: Neurosurgery;  Laterality: Left;   INTRAOPERATIVE TRANSESOPHAGEAL ECHOCARDIOGRAM  02/15/2012   Procedure: INTRAOPERATIVE TRANSESOPHAGEAL ECHOCARDIOGRAM;  Surgeon: Rexene Alberts, MD;  Location: [place];  Service: Open Heart Surgery;  Laterality: N/A;   MAZE  02/15/2012   Procedure: MAZE;  Surgeon: Rexene Alberts, MD;  Location: [place];  Service: Open Heart Surgery;  Laterality: N/A;   MITRAL VALVE REPLACEMENT  02/15/2012   Procedure: MITRAL VALVE (MV) REPLACEMENT;  Surgeon: Rexene Alberts, MD;  Location: [place];  Service: Open Heart Surgery;  Laterality: N/A;   ROBOTIC ASSISTED LAPAROSCOPIC HYSTERECTOMY AND SALPINGECTOMY Bilateral 01/02/2019   Procedure: XI ROBOTIC ASSISTED LAPAROSCOPIC TOTAL HYSTERECTOMY WITH BILATERAL SALPINGOOPHORECTOMY, SENTINEL LYMPH NODE BIOPSY;  Surgeon: [name], MD;  [facility];  Service: Gynecology;  Laterality: Bilateral;   TEE WITHOUT CARDIOVERSION  12/21/2011   Procedure: TRANSESOPHAGEAL ECHOCARDIOGRAM (TEE);  Surgeon: Jettie Booze, MD;  Location: [place];  Service: Cardiovascular;  Laterality: N/A;   TOOTH EXTRACTION N/A 11/19/2021   Procedure: DENTAL EXTRACTION TEETH NUMBER 14, 19, 30, 31;  Surgeon: Diona Browner, DMD;  Location: [place];  Service: Oral Surgery;  Laterality: N/A;   TRICUSPID VALVE REPLACEMENT  02/15/2012   Procedure: TRICUSPID VALVE REPAIR;  Surgeon: Rexene Alberts, MD;  Location: [place];  Service: Open Heart Surgery;  Laterality: N/A;   TUBAL LIGATION     at time of her c-section   Patient Active Problem List   Diagnosis Date Noted   Hypertension associated with diabetes ([place]) 12/08/2021   Anticoagulated on warfarin 12/07/2021   Acute subdural hematoma ([place]) 12/02/2021   Chronic anticoagulation - on coumadin for mechanical mitral valve, afib 12/02/2021   Subdural hematoma, nontraumatic ([place]) 12/02/2021   Pancytopenia, acquired ([place]) 06/21/2019   Peripheral neuropathy due to chemotherapy ([place]) 06/21/2019   Anemia, chronic disease 05/21/2019   Drug-induced hyperglycemia 02/25/2019   Stage 3a chronic kidney disease (CKD) ([place]) 02/04/2019   Iron deficiency anemia 01/18/2019   Endometrial cancer ([place]) 01/02/2019   Uterine cancer ([place]) 12/27/2018   Special screening for malignant neoplasms, colon 04/27/2016   LBBB (left bundle branch block) 10/07/2013   Encounter for therapeutic drug monitoring 03/19/2013   Mitral valve disorders(424.0) 10/12/2012   Heart valve replaced by other means 10/12/2012   Atrial flutter ([place])    CVA (cerebral infarction) 04/14/2012   S/P TVR (tricuspid valve repair) 03/19/2012   S/P MVR (mitral valve replacement) 03/19/2012   History of mitral valve replacement with mechanical valve 02/15/2012   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   S/P Maze operation for atrial fibrillation 02/15/2012   CHF  (congestive heart failure) ([place]) 02/06/2012   MR (mitral regurgitation) 02/06/2012   Tricuspid regurgitation 01/16/2012   Rheumatic fever 01/16/2012   Obesity (BMI 30-39.9) 01/16/2012   Mitral regurgitation    Chronic diastolic CHF (congestive heart failure) ([place]) 12/19/2011   Snoring 02/28/2011   Atrial fibrillation ([place]) 93/23/5573   Chronic systolic dysfunction of left ventricle 01/05/2011   Hypertension 10/18/2010   Type 2 diabetes mellitus ([place]) 10/18/2010   Hypercholesterolemia 10/18/2010   Mediastinal lymphadenopathy 10/18/2010   Asthma 10/18/2010    ONSET DATE: 12/15/2021 (referral date)  REFERRING DIAG: S06.5XAA (ICD-10-CM) - SDH (subdural hematoma) (HCC)  THERAPY DIAG:  Unsteadiness on feet  Muscle weakness (generalized)  Abnormality of gait and mobility  Rationale for Evaluation and Treatment: Rehabilitation  SUBJECTIVE:  SUBJECTIVE STATEMENT:  Patient reports she is doing fine. No changes to medications/falls. Patient reports that she went on two long walks in her neighborhood over the weekend. Patient expresses at end of session that she wants to try the bike at start of next session for her legs as well as do more repetitions of stairs so she has an easier time getting into her granddaughter's room.  Pt accompanied by: self  PERTINENT HISTORY:   Per Note on 12/10/2021:  "Brief HPI:   DEOSHA WERDEN is a 67 y.o. female with history of CHF, OSA, CKD, MVR-chronic Coumadin, left MCA stroke 2014, recent dental extraction who was evaluated in the ED 12/02/21 due to issues with ongoing bleeding since her procedure.  She will did report headaches which was followed by somnolence with confusion and CT of head done showing acute on chronic SDH with 16 mm shift to the left, 8 mm left to  right midline shift and effacement of left greater than right psych eye.  INR at admission was 2.4 and was reversed with Kcentra.  Dr. Reatha Armour was consulted and she underwent left frontotemporal craniotomy for evacuation of hematoma on 11/24.  She was started on Keppra for seizure prophylaxis and cleared to start IV heparin on 11/27.  Recommendations were to repeat CT once heparin was therapeutic prior to transition to oral [facility]."  Patient transferred to [facility] on 12/10/2021 per notes and saw all three disciplines. Finished skilled physical therapy in that setting on 12/15/2021.  PAIN:  Are you having pain? No  PRECAUTIONS: Fall and ICD/Pacemaker  WEIGHT BEARING RESTRICTIONS: No  FALLS: Has patient fallen in last 6 months?  Patient reports no falls in last 6 months  PATIENT GOALS: "I want my speech to be better. My family want me to work on my safety."  OBJECTIVE:   DIAGNOSTIC FINDINGS:  12/08/2021 CT Head w/o Contrast: "IMPRESSION: 1. Mixed density subdural hematoma on the left is unchanged. Mass-effect with 4 mm midline shift to the right unchanged. 2. Small right temporal subdural hematoma unchanged. 3. No new area of hemorrhage."  Vitals:   02/10/22 1110  BP: (!) 115/57  Pulse: (!) 51     TODAY'S TREATMENT:                                                                                                                               NMR: Corner balance:  EC on firm 1 x 30" EC on firm with vertical head turns 1 x 30" EC on firm with horizontal head turns 1 x30" EC on foam 3 x 30" EC on foam with vertical head turns 3 x 30" EC on foam with horizontal head turns 3 x 30"  Attempted 6MWT: patient tolerted 2.5 minutes with 470 feet of ambulation around multiple obstacles in gym w/o AD (SPO2: 96%)  There Act: FxSTS without UE use (SBA): Trial 1: 12.28" Trial 2: 12.83" Trial 3: 13.37"  Curb navigation (SBA) Patient able to complete with SBA for both step up/off  curb with LBQC and no AD x 2 (no signs of unsteadiness, proper pacing, and eccentric lower)  Stair climb (SBA): Education on sequencing with LBQC to improve as patient tends to leave behind as ascends stairs Patient ascends with mix of step through/step to gait pattern with LBQC x 3 and then without AD x 3 (fair foot positioning on step on ~ 3 occasions but no signs of imbalance)  PATIENT EDUCATION: Education details: Continue HEP Person educated: Patient Education method: Explanation Education comprehension: verbalized understanding and needs further education  HOME EXERCISE PROGRAM: Access Code: [email or web address] URL: [email or web address] Date: 01/13/2022 Prepared by: Malachi Carl  Exercises - Sit to Stand Without Arm Support  - 1 x daily - 7 x weekly - 3 sets - 5 reps - Corner Balance Feet Together: Eyes Closed With Head Turns  - 1 x daily - 7 x weekly - 3 sets - 10 reps - Standing 3-Way Kick  - 1 x daily - 7 x weekly - 3 sets - 10 reps - Standing Marching  - 1 x daily - 7 x weekly - 3 sets - 10 reps  KB Home	[place] 6 Week Beginner Walking Program - pdf provided (increase gait speed)  GOALS: Goals reviewed with patient? Yes  LONG TERM GOALS: Target date: 03/03/2022 (LTG only given length of POC)  Patient will report demonstrate independence with final HEP in order to maintain current gains and continue to progress after physical therapy discharge.   Baseline: To be provided Goal status: INITIAL  2.  Patient will improve gait speed to 1.1 m/s or greater with LRAD to indicate a reduced risk for falls.   Baseline: 0.81 m/s Goal status: INITIAL  3.  Patient will improve their 5x Sit to Stand score to less than 15 seconds to demonstrate a decreased risk for falls and improved LE strength.   Baseline: 21.76" with decreased eccentric control to sit and mild LOB on final sit to stand and able to recover independently; improved to 16.86" w/o UE and no loss of balance on  02/01/2022; 12.28" on 02/10/2022 w/o UE use Goal status: MET  4.  Patient will improve ABC Score to 67% or greater to indicate a decreased risk for falls and improved self-reported confidence in balance and sense of steadiness.   Baseline: 61.3% Goal status: INITIAL  5.  Patient will improve 2MWT with LRAD to 155.2 m or greater to achieve age reported norms and demonstrate improved cardiorespiratory endurance and mobility. Baseline: 89.22mGoal status: INITIAL   ASSESSMENT:  CLINICAL IMPRESSION: Session emphasized functional therapuetic activities including curb and stair navigation with [facility] without AD sequencing. Patient demonstrated proper safety technique with min ques from therapist and will benefit from reinforcement next session to maximize carryover to home environment. Improved EC balance in today's session with only 2 posterior LOB that required opening eyes and min A from therapist to correct. Patient will benefit from skilled physical therapy services to continue to progress mobility and decrease risk for falls.   OBJECTIVE IMPAIRMENTS: Abnormal gait, decreased balance, decreased knowledge of use of DME, decreased mobility, difficulty walking, decreased strength, decreased safety awareness, impaired sensation, and impaired tone.   ACTIVITY LIMITATIONS: standing, squatting, and caring for others  PARTICIPATION LIMITATIONS: driving and community activity  PERSONAL FACTORS: Age, Past/current experiences, and 1-2 comorbidities: see above  are also affecting patient's functional outcome.   REHAB POTENTIAL: Good  CLINICAL DECISION MAKING:  Evolving/moderate complexity  EVALUATION COMPLEXITY: Moderate  PLAN:  PT FREQUENCY: 1x/week  PT DURATION: 8 weeks  PLANNED INTERVENTIONS: Therapeutic exercises, Therapeutic activity, Neuromuscular re-education, Gait training, Self Care, and Manual therapy  PLAN FOR NEXT SESSION: Dynamic gait tasks, sit to stands for speed, gait speed,  review HEP + walking program progress; fall recovery; patient enjoyed use of blaze pods; curb navigation; warmup on SCIFIT, stairs for endurance  Malachi Carl, PT, DPT  02/10/2022, 12:03 PM

## 2022-02-10 NOTE — Therapy (Signed)
OUTPATIENT SPEECH LANGUAGE PATHOLOGY TREATMENT   Patient Name: [name] MRN: [id number] DOB:[birth year], 67 y.o., female Today's Date: 02/10/2022  PCP: Lennie Odor, [place] REFERRING PROVIDER: Cathlyn Parsons., PA-C  END OF SESSION:  End of Session - 02/10/22 0930     Visit Number 5    Number of Visits 25    Date for SLP Re-Evaluation 03/31/22    Authorization Type [employer]    Progress Note Due on Visit 10    SLP Start Time 0930    SLP Stop Time  9528    SLP Time Calculation (min) 45 min    Activity Tolerance Patient tolerated treatment well               Past Medical History:  Diagnosis Date   Abnormal chest CT    Anemia    Aortic atherosclerosis (HCC)    Asthma    Atrial enlargement, left    severe   Atrial fibrillation ([place]) 01/05/2011   Chronic persistent, failed DCCV    Atrial flutter (HCC)    Bronchospasm 1998   Cardiomegaly    Carotid bruit    Chronic diastolic congestive heart failure ([place])    Diabetes mellitus    Type II   Ejection fraction < 50%    35-40%,    Heart murmur    History of blood transfusion    History of radiation therapy 03/18/19-04/15/19   endometrial - vaginal brachytherapy -  Dr. Sondra Come    Hypercholesterolemia    Hypertension    Junctional rhythm 09/14/2018   Noted on EKG   Left bundle branch block (LBBB) 09/14/2018   Noted on EKG   LVH (left ventricular hypertrophy) 10/10/2016   Moderate, Noted on ECHO   Mitral regurgitation    Obesity    Obesity (BMI 30-39.9) 01/16/2012   Persistent atrial fibrillation ([employer])    Polyp of rectum    Rheumatic fever 01/16/2012   Reported during childhood   S/P Maze operation for atrial fibrillation 02/15/2012   Complete biatrial lesion set using bipolar radiofrequency and cryothermy ablation with clipping of LA appendage   S/P mitral valve replacement with metallic valve 41/32/4401   42m Sorin [employer] Optiform mechanical prosthesis   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    266mEdwards mc3 ring annuloplasty   Shortness of breath    with exertion   Sleep apnea    DOES NOT HAVE CPAP   [id number]   Post op , left arm weakness   Tricuspid regurgitation 01/16/2012   [facility]   Past Surgical History:  Procedure Laterality Date   CARDIAC CATHETERIZATION  >5 years   CARDIOVASCULAR STRESS TEST  09/2010   CARDIOVERSION  03/25/2011   Procedure: CARDIOVERSION;  Surgeon: JaJettie BoozeMD;  [facility] Service: Cardiovascular;  Laterality: N/A;   CARDIOVERSION N/A 07/27/2012   Procedure: CARDIOVERSION;  Surgeon: JaJettie BoozeMD;  [place] Service: Cardiovascular;  Laterality: N/A;   CESAREAN SECTION     COLONOSCOPY WITH PROPOFOL N/A 04/27/2016   Procedure: COLONOSCOPY WITH PROPOFOL;  Surgeon: ViWilford CornerMD;  [facility];  Service: Endoscopy;  Laterality: N/A;   CRANIOTOMY Left 12/03/2021   Procedure: FRONTOTEMPORAL CRANIOTOMY FOR EVACUATION SUBDURAL HEMATOMA;  Surgeon: DaKarsten RoDO;  [place] [facility] Service: Neurosurgery;  Laterality: Left;   INTRAOPERATIVE TRANSESOPHAGEAL ECHOCARDIOGRAM  02/15/2012   Procedure: INTRAOPERATIVE TRANSESOPHAGEAL ECHOCARDIOGRAM;  Surgeon: ClRexene AlbertsMD;  [place] [facility]  Service: Open Heart Surgery;  Laterality: N/A;   MAZE  02/15/2012   Procedure: MAZE;  Surgeon: Rexene Alberts, MD;  Location: [place];  Service: Open Heart Surgery;  Laterality: N/A;   MITRAL VALVE REPLACEMENT  02/15/2012   Procedure: MITRAL VALVE (MV) REPLACEMENT;  Surgeon: Rexene Alberts, MD;  Location: [place];  Service: Open Heart Surgery;  Laterality: N/A;   ROBOTIC ASSISTED LAPAROSCOPIC HYSTERECTOMY AND SALPINGECTOMY Bilateral 01/02/2019   Procedure: XI ROBOTIC ASSISTED LAPAROSCOPIC TOTAL HYSTERECTOMY WITH BILATERAL SALPINGOOPHORECTOMY, SENTINEL LYMPH NODE BIOPSY;  Surgeon: [name], MD;  Location: [facility];  Service: Gynecology;  Laterality: Bilateral;   TEE WITHOUT  CARDIOVERSION  12/21/2011   Procedure: TRANSESOPHAGEAL ECHOCARDIOGRAM (TEE);  Surgeon: Jettie Booze, MD;  Location: [place];  Service: Cardiovascular;  Laterality: N/A;   TOOTH EXTRACTION N/A 11/19/2021   Procedure: DENTAL EXTRACTION TEETH NUMBER 14, 19, 30, 31;  Surgeon: Diona Browner, DMD;  Location: [place];  Service: Oral Surgery;  Laterality: N/A;   TRICUSPID VALVE REPLACEMENT  02/15/2012   Procedure: TRICUSPID VALVE REPAIR;  Surgeon: Rexene Alberts, MD;  Location: [place];  Service: Open Heart Surgery;  Laterality: N/A;   TUBAL LIGATION     at time of her c-section   Patient Active Problem List   Diagnosis Date Noted   Hypertension associated with diabetes ([place]) 12/08/2021   Anticoagulated on warfarin 12/07/2021   Acute subdural hematoma ([place]) 12/02/2021   Chronic anticoagulation - on coumadin for mechanical mitral valve, afib 12/02/2021   Subdural hematoma, nontraumatic ([place]) 12/02/2021   Pancytopenia, acquired ([place]) 06/21/2019   Peripheral neuropathy due to chemotherapy ([place]) 06/21/2019   Anemia, chronic disease 05/21/2019   Drug-induced hyperglycemia 02/25/2019   Stage 3a chronic kidney disease (CKD) ([place]) 02/04/2019   Iron deficiency anemia 01/18/2019   Endometrial cancer ([place]) 01/02/2019   Uterine cancer ([place]) 12/27/2018   Special screening for malignant neoplasms, colon 04/27/2016   LBBB (left bundle branch block) 10/07/2013   Encounter for therapeutic drug monitoring 03/19/2013   Mitral valve disorders(424.0) 10/12/2012   Heart valve replaced by other means 10/12/2012   Atrial flutter ([place])    CVA (cerebral infarction) 04/14/2012   S/P TVR (tricuspid valve repair) 03/19/2012   S/P MVR (mitral valve replacement) 03/19/2012   History of mitral valve replacement with mechanical valve 02/15/2012   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   S/P Maze operation for atrial fibrillation 02/15/2012   CHF (congestive heart failure) ([place]) 02/06/2012   MR (mitral  regurgitation) 02/06/2012   Tricuspid regurgitation 01/16/2012   Rheumatic fever 01/16/2012   Obesity (BMI 30-39.9) 01/16/2012   Mitral regurgitation    Chronic diastolic CHF (congestive heart failure) ([place]) 12/19/2011   Snoring 02/28/2011   Atrial fibrillation ([place]) 31/54/0086   Chronic systolic dysfunction of left ventricle 01/05/2011   Hypertension 10/18/2010   Type 2 diabetes mellitus ([place]) 10/18/2010   Hypercholesterolemia 10/18/2010   Mediastinal lymphadenopathy 10/18/2010   Asthma 10/18/2010    ONSET DATE: 12-02-2021   REFERRING DIAG: P61.5XAA (ICD-10-CM) - SDH (subdural hematoma) (HCC)   THERAPY DIAG:  Aphasia  Rationale for Evaluation and Treatment: Rehabilitation  SUBJECTIVE:   SUBJECTIVE STATEMENT: "It' going good" re:HEP  PERTINENT HISTORY: L-[facility] 2014, dental procedure 11-19-2021 resulting in ongoing bleeding. Presented to ED 12-02-2021, imaging revealed acute on chronic SDH. While admitted began showing speech disturbances. Underwent L frontotemporal craniotomy for evacuation of hematoma 12-03-2021. Received ST services while admitted to inpatient rehab.   PAIN:  Are you having pain? No  FALLS: Has patient fallen in last 6 months?  No  PATIENT GOALS: "do how I did before"  OBJECTIVE:   PATIENT REPORTED OUTCOME MEASURES (PROM): Deferred d/t time, will complete initial therapy session  TODAY'S TREATMENT:   02-10-22: Pt reported that she has been doing her HEP with counting. She did not use her external aid; she worked through the challenge. She stated she has not been practicing her reading and writing as much. SLP provided re-education of importance and benefit of practice at home. SLP led pt in divergent naming task where she produced 20/26 target words, benefiting from occasional semantic cues. SLP demonstrated modified semantic feature analysis utilizing gestural, phonemic, and semantic cues to aid pt in producing target word. Provided education on use of  description as anomia strategy with goal to keep conversation moving by self-cueing or getting A from communication partner. Updated HEP: Categorical naming activity   02-01-22: Given phoneme, pt able to relay accurate letter and sound in 100% of trials. Reads simples words in 5/5 trials. Pt requests to work on counting, is finding self repeating numbers impacting successful counting. SLP educates on use of external aids and demonstrates use of to aid in accurate counting up to 200. Pt able to count from 1-200 with rare errors, ~95% accuracy. Self-ID 100% of trials with use of external aid. SLP demonstrates limiting visual field with pt able to carryover with mod-I PRN during remainder of task. Occasional need for extra time to process and determine correct number. SLP provided feedback on slowing rate resulting in improved fluency during counting task.   01-27-22: SLP reviewed HEP and facilitated direct instruction for challenging items. Use of additional, similar stimuli employed to reinforce challenging phoneme/grapheme correspondence. Pt evidencing sub-optimal phoneme/grapheme correspondence at sound level which is barrier for reading and spelling. SLP provided pt with comprehensive list of orthographic representations of consonant phonemes and consonant blends to assess which require additional instruction. Of 31, pt requires model and verbal cueing to produce correct phoneme for 19. SLP provides key words, articulatory cues, and descriptive representations to reinforce target sound. Pt evidencing increased difficulty with x5 blends addressed today's session. Will continue to work on strengthening this understanding next session.   PATIENT EDUCATION: Education details: see above Person educated: Patient Education method: Consulting civil engineer, Demonstration, Verbal cues, and Handouts Education comprehension: verbalized understanding, returned demonstration, and needs further education   GOALS: Goals reviewed  with patient? Yes  SHORT TERM GOALS: Target date: 02-03-2022  Pt will successfully complete moderately complex language tasks in 90% of opportunities given occasional min-A over 2 sessions Baseline: Goal status: NOT MET  2.  Pt will write 18/20 trained words accurately with occasional min-A  Baseline:  Goal status: MET  3.  During structured exercise, pt will successful demonstrate anomia strategy with rare min-A in 80% of trials  Baseline:  Goal status: PARTIALLY MET  4.  Pt will ID and attempt to correct paraphasia during structured activities in 50% of opportunities  Baseline:  Goal status: MET   LONG TERM GOALS: Target date: 03-31-2022  Pt will complete HEP 5/7 days over 2 week period with min-A from family  Baseline:  Goal status: IN PROGRESS  2.  Pt will report improvement via patient reported outcome measure by d/c Baseline: to be completed initial therapy session Goal status: IN PROGRESS  3.  Pt will ID and attempt to correct paraphasia during structured activities in 80% of opportunities  Baseline:  Goal status: IN PROGRESS  4.  Pt  will use external support to A in reading of information on phone with mod-I following direct instruction Baseline:  Goal status: IN PROGRESS  5.  Pt will generate approximate spelling of untrained words, efficacy or which to be judged by SLP being able to determine target in 80% of trials  Baseline:  Goal status: IN PROGRESS   ASSESSMENT:  CLINICAL IMPRESSION: SLP has provided skilled interventions targeting reading, writing, anomia strategies and compensations. This session focused on taxing language system to A in word finding with direct instruction for using description strategy as anomia compensation. When anomia occurs, pt with occasional benefit from semantic and phonemic cueing. Continue per POC.   OBJECTIVE IMPAIRMENTS: include memory, executive functioning, receptive language, aphasia, and apraxia. These impairments are  limiting patient from managing medications, managing appointments, managing finances, household responsibilities, ADLs/IADLs, and effectively communicating at home and in community. Factors affecting potential to achieve goals and functional outcome are previous level of function, severity of impairments, financial resources, and family/community support. Patient will benefit from skilled SLP services to address above impairments and improve overall function.  REHAB POTENTIAL: Fair (prior level of function s/p stroke 2014)  PLAN:  SLP FREQUENCY: 1-2x/week - SLP recommends 2x a week, pt elects to initiate therapy at 1x a week   SLP DURATION: 12 weeks  PLANNED INTERVENTIONS: Language facilitation, Cueing hierachy, Cognitive reorganization, Internal/external aids, Functional tasks, Multimodal communication approach, SLP instruction and feedback, Compensatory strategies, and Patient/family education  Leroy Libman, [place] 02/10/2022, 10:11 AM

## 2022-02-10 NOTE — Therapy (Signed)
OUTPATIENT OCCUPATIONAL THERAPY NEURO TREATMENT  Patient Name: [name] MRN: [id number] DOB:10/24/1955, 67 y.o., female Today's Date: 02/10/2022  PCP: Lennie Odor, [place]  REFERRING PROVIDER: Cathlyn Parsons, PA-C  END OF SESSION:  OT End of Session - 02/10/22 1046     Visit Number 2    Number of Visits 9    Date for OT Re-Evaluation 03/04/22    Authorization Type [employer] - requires auth    Progress Note Due on Visit 9    OT Start Time 1018    OT Stop Time 1100    OT Time Calculation (min) 42 min    Activity Tolerance Patient tolerated treatment well    Behavior During Therapy WFL for tasks assessed/performed              Past Medical History:  Diagnosis Date   Abnormal chest CT    Anemia    Aortic atherosclerosis (HCC)    Asthma    Atrial enlargement, left    severe   Atrial fibrillation ([place]) 01/05/2011   Chronic persistent, failed DCCV    Atrial flutter ([place])    Bronchospasm 1998   Cardiomegaly    Carotid bruit    Chronic diastolic congestive heart failure ([place])    Diabetes mellitus    Type II   Ejection fraction < 50%    35-40%,    Heart murmur    History of blood transfusion    History of radiation therapy 03/18/19-04/15/19   endometrial - vaginal brachytherapy -  Dr. Sondra Come    Hypercholesterolemia    Hypertension    Junctional rhythm 09/14/2018   Noted on EKG   Left bundle branch block (LBBB) 09/14/2018   Noted on EKG   LVH (left ventricular hypertrophy) 10/10/2016   Moderate, Noted on ECHO   Mitral regurgitation    Obesity    Obesity (BMI 30-39.9) 01/16/2012   Persistent atrial fibrillation ([place])    Polyp of rectum    Rheumatic fever 01/16/2012   Reported during childhood   S/P Maze operation for atrial fibrillation 02/15/2012   Complete biatrial lesion set using bipolar radiofrequency and cryothermy ablation with clipping of LA appendage   S/P mitral valve replacement with metallic valve 11/06/2534   24m Sorin [employer]  Optiform mechanical prosthesis   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   266mEdwards mc3 ring annuloplasty   Shortness of breath    with exertion   Sleep apnea    DOES NOT HAVE CPAP   [id number]   Post op , left arm weakness   Tricuspid regurgitation 01/16/2012   [facility]   Past Surgical History:  Procedure Laterality Date   CARDIAC CATHETERIZATION  >5 years   CARDIOVASCULAR STRESS TEST  09/2010   CARDIOVERSION  03/25/2011   Procedure: CARDIOVERSION;  Surgeon: JaJettie BoozeMD;  [place] Service: Cardiovascular;  Laterality: N/A;   CARDIOVERSION N/A 07/27/2012   Procedure: CARDIOVERSION;  Surgeon: JaJettie BoozeMD;  [place]: [facility] Service: Cardiovascular;  Laterality: N/A;   CESAREAN SECTION     COLONOSCOPY WITH PROPOFOL N/A 04/27/2016   Procedure: COLONOSCOPY WITH PROPOFOL;  Surgeon: ViWilford CornerMD;  [facility]  Service: Endoscopy;  Laterality: N/A;   CRANIOTOMY Left 12/03/2021   Procedure: FRONTOTEMPORAL CRANIOTOMY FOR EVACUATION SUBDURAL HEMATOMA;  Surgeon: DaKarsten RoDO;  Location: [facility] Service: Neurosurgery;  Laterality: Left;   INTRAOPERATIVE TRANSESOPHAGEAL ECHOCARDIOGRAM  02/15/2012   Procedure: INTRAOPERATIVE  TRANSESOPHAGEAL ECHOCARDIOGRAM;  Surgeon: Rexene Alberts, MD;  Location: [place];  Service: Open Heart Surgery;  Laterality: N/A;   MAZE  02/15/2012   Procedure: MAZE;  Surgeon: Rexene Alberts, MD;  Location: [place];  Service: Open Heart Surgery;  Laterality: N/A;   MITRAL VALVE REPLACEMENT  02/15/2012   Procedure: MITRAL VALVE (MV) REPLACEMENT;  Surgeon: Rexene Alberts, MD;  Location: [place];  Service: Open Heart Surgery;  Laterality: N/A;   ROBOTIC ASSISTED LAPAROSCOPIC HYSTERECTOMY AND SALPINGECTOMY Bilateral 01/02/2019   Procedure: XI ROBOTIC ASSISTED LAPAROSCOPIC TOTAL HYSTERECTOMY WITH BILATERAL SALPINGOOPHORECTOMY, SENTINEL LYMPH NODE BIOPSY;  Surgeon: [name], MD;  [facility];  Service: Gynecology;  Laterality: Bilateral;   TEE WITHOUT CARDIOVERSION  12/21/2011   Procedure: TRANSESOPHAGEAL ECHOCARDIOGRAM (TEE);  Surgeon: Jettie Booze, MD;  Location: [place];  Service: Cardiovascular;  Laterality: N/A;   TOOTH EXTRACTION N/A 11/19/2021   Procedure: DENTAL EXTRACTION TEETH NUMBER 14, 19, 30, 31;  Surgeon: Diona Browner, DMD;  Location: [place];  Service: Oral Surgery;  Laterality: N/A;   TRICUSPID VALVE REPLACEMENT  02/15/2012   Procedure: TRICUSPID VALVE REPAIR;  Surgeon: Rexene Alberts, MD;  Location: [place];  Service: Open Heart Surgery;  Laterality: N/A;   TUBAL LIGATION     at time of her c-section   Patient Active Problem List   Diagnosis Date Noted   Hypertension associated with diabetes ([place]) 12/08/2021   Anticoagulated on warfarin 12/07/2021   Acute subdural hematoma ([place]) 12/02/2021   Chronic anticoagulation - on coumadin for mechanical mitral valve, afib 12/02/2021   Subdural hematoma, nontraumatic ([place]) 12/02/2021   Pancytopenia, acquired ([place]) 06/21/2019   Peripheral neuropathy due to chemotherapy ([place]) 06/21/2019   Anemia, chronic disease 05/21/2019   Drug-induced hyperglycemia 02/25/2019   Stage 3a chronic kidney disease (CKD) ([place]) 02/04/2019   Iron deficiency anemia 01/18/2019   Endometrial cancer ([place]) 01/02/2019   Uterine cancer ([place]) 12/27/2018   Special screening for malignant neoplasms, colon 04/27/2016   LBBB (left bundle branch block) 10/07/2013   Encounter for therapeutic drug monitoring 03/19/2013   Mitral valve disorders(424.0) 10/12/2012   Heart valve replaced by other means 10/12/2012   Atrial flutter ([place])    CVA (cerebral infarction) 04/14/2012   S/P TVR (tricuspid valve repair) 03/19/2012   S/P MVR (mitral valve replacement) 03/19/2012   History of mitral valve replacement with mechanical valve 02/15/2012   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   S/P Maze operation for atrial fibrillation 02/15/2012   CHF  (congestive heart failure) ([place]) 02/06/2012   MR (mitral regurgitation) 02/06/2012   Tricuspid regurgitation 01/16/2012   Rheumatic fever 01/16/2012   Obesity (BMI 30-39.9) 01/16/2012   Mitral regurgitation    Chronic diastolic CHF (congestive heart failure) ([place]) 12/19/2011   Snoring 02/28/2011   Atrial fibrillation ([place]) 09/62/8366   Chronic systolic dysfunction of left ventricle 01/05/2011   Hypertension 10/18/2010   Type 2 diabetes mellitus ([place]) 10/18/2010   Hypercholesterolemia 10/18/2010   Mediastinal lymphadenopathy 10/18/2010   Asthma 10/18/2010    ONSET DATE: Pt had teeth extracted on 01/19/2021 with continued bleeding; she presented the ED on 12/01/2021; unknown onset date of SDH; admitted to IRF 12/10/2021; discharged 12/16/2021  REFERRING DIAG: S06.5XAA (ICD-10-CM) - SDH (subdural hematoma)   THERAPY DIAG:  Muscle weakness (generalized)  Other symptoms and signs involving the musculoskeletal system  Subdural hematoma, nontraumatic (HCC)  Persistent atrial fibrillation (HCC)  Rationale for Evaluation and Treatment: Rehabilitation  SUBJECTIVE:   SUBJECTIVE STATEMENT: She  has been able to do a lot of things since coming home but still lacks strength and use of her LUE at PLOF. She has had difficulty with handwriting but attributes this to her cognition.  Pt accompanied by: self  PERTINENT HISTORY:   "YEHUDIT FULGINITI is a 67 y.o. female with history of CHF, OSA, CKD, MVR-chronic Coumadin, left MCA stroke 2014, recent dental extraction who was evaluated in the ED 12/02/21 due to issues with ongoing bleeding since her procedure.  She will did report headaches which was followed by somnolence with confusion and CT of head done showing acute on chronic SDH with 16 mm shift to the left, 8 mm left to right midline shift and effacement of left greater than right psych eye.  INR at admission was 2.4 and was reversed with Kcentra.  Dr. Reatha Armour was consulted and she underwent left  frontotemporal craniotomy for evacuation of hematoma on 11/24.  She was started on Keppra for seizure prophylaxis and cleared to start IV heparin on 11/27.  Recommendations were to repeat CT once heparin was therapeutic prior to transition to oral AC.     On 11/29 she had significant worsening of aphasia, problems with ADL and decreased ability to follow commands with more of a left bias with left gaze preference.  EEG was done showing area of epileptogenicity arising from the left frontal region with cortical dysfunction left hemisphere.  Repeat CT head showed mixed density left frontal parietal and temporal lobe which was unchanged and new small right temporal SDH which was unchanged.  She was cleared to resume Coumadin.  She had improvement in motor planning deficits with no overt left neglect or bias per recent therapy notes.  She continued to be intact bilaterally, motor planning deficits, minimal verbalization with decreasing carryover.  CIR was recommended due to functional decline."  PRECAUTIONS: Fall and Other: No driving for at least 6 months or  till cleared by neurology.   WEIGHT BEARING RESTRICTIONS: No  PAIN:  Are you having pain? No  FALLS: Has patient fallen in last 6 months? No  LIVING ENVIRONMENT: Lives with: lives with their spouse and lives with their son Lives in: House/apartment Stairs: Yes: External: 1 steps; none Has following equipment at home: Quad cane large base, shower chair, and bed side commode  PLOF: Independent; driving; picked up grandkids  PATIENT GOALS: return to PLOF  OBJECTIVE:   HAND DOMINANCE: Right  ADLs: Overall ADLs: mod I  IADLs: Requires min A with most due to mobility and cognition  MOBILITY STATUS: Independent  ACTIVITY TOLERANCE: Activity tolerance: good  FUNCTIONAL OUTCOME MEASURES: FOTO: Severity: Slight (Intake FS: 88) ; expected score at d/c: 95  UPPER EXTREMITY ROM:     AROM Right (eval) Left (eval)  Shoulder flexion  WNL WNL  Shoulder abduction WNL WNL  Elbow flexion WNL WNL  Elbow extension WNL WNL  Wrist flexion WNL WNL  Wrist extension WNL WNL  Wrist pronation WNL WNL  Wrist supination WNL WNL   Digit Composite Flexion WNL WNL  Digit Composite Extension WNL WNL  Digit Opposition WNL Dyskinesia noted  (Blank rows = not tested)  UPPER EXTREMITY MMT:     MMT Right (eval) Left (eval)  Shoulder flexion WNL WNL  Shoulder abduction WNL WNL  Elbow flexion WNL WNL  Elbow extension WNL WNL  (Blank rows = not tested)  HAND FUNCTION: Grip strength: Right: 66.3 lbs; Left: 46.7 lbs  COORDINATION: 9 Hole Peg test: Right: 31 sec; Left: 40  sec  SENSATION: WFL  EDEMA: none  MUSCLE TONE: RUE: Within functional limits and LUE: Within functional limits  COGNITION: Overall cognitive status: Impaired; see ST eval  VISION: Subjective report: no changes Baseline vision: Wears glasses for reading only PRN; chose not to drive at night Visual history:  n/a  VISION ASSESSMENT: WFL  PERCEPTION: WFL  PRAXIS: WFL  OBSERVATIONS: Pt ambulates with quadcane. No LOB noted. Pt appears well kept and is able to answer most questions appropriately with increased time. Pt able to write full name without error, but when asked to write the day of the week, "Thursday", she wrote "Thurday".    TODAY'S TREATMENT:                                                                                                                              - Therapeutic exercises completed for duration as noted below including:   OT reviewed red theraputty exercises (search, grip, pinch) as noted in patient instructions for coordination and strength. Pt required min cueing for proper completion.   OT initiated LUE coordination [place] as noted in patient instructions including pick up and placement of items, shuffling and turning cards, item stacking and unstacking, rolling a piece of tissue paper into a ball, rolling  golf balls in hand, rolling a pen in between fingers and thumb, picking up and storing items in hand, and then translating stored items to tips of thumb and index finger before placing them individually into a container. Patient able to return demonstration of all exercises. Handout provided.  Wrist flex and ext with yellow flex bar x 2 min for strength and endurance of affected extremity  Supination with yellow flex bar x 2 min for strength and endurance of affected extremity  Pronation with yellow flex bar x 2 min for strength and endurance of affected extremity  Penny pick up, store, and placement into bank for coordination with L  Medium peg pattern completion with L hand for cognition and coordination. Moderate cues for peg placement  PATIENT EDUCATION: Education details: HEP Person educated: Patient Education method: Consulting civil engineer, Media planner, Verbal cues, and Handouts Education comprehension: verbalized understanding, returned demonstration, and needs further education  HOME EXERCISE PROGRAM: 12/28 - LUE red putty hep 2/1 - L coordination HEP   GOALS:  SHORT TERM GOALS: Target date: 02/03/2022   Patient will be independent with initial LUE HEP. Baseline: Goal status: INITIAL  2.  Patient will demonstrate at least 50 lbs L grip strength as needed to open jars and other containers. Baseline: 46.7 lbs Goal status: INITIAL   LONG TERM GOALS: Target date: 03/04/2022    Patient will demonstrate updated LUE HEP with 25% verbal cues or less for proper execution. Baseline:  Goal status: INITIAL  2.  Patient will demonstrate at least 56.7 lbs L grip strength as needed to open jars and other containers. Baseline: 46.7 lbs Goal status: INITIAL  3.  Patient will complete  nine-hole peg with use of L in 32 seconds or less. Baseline: 40 seconds Goal status: INITIAL  4.  Pt will complete FOTO assessment at time of discharge scoring 95 or greater indicating functional  progression with ADL and IADL completion.. Baseline: 88 Goal status: INITIAL  5.  Pt will report completing typical IADLs mod I with good safety as evidenced in therapy clinic.  Baseline: requiring at least min A with supervision due to cognition Goal status: INITIAL  ASSESSMENT:  CLINICAL IMPRESSION: Pt would benefit from skilled OT services in the outpatient setting to work on impairments as noted below to help pt return to PLOF as able.    PERFORMANCE DEFICITS: in functional skills including ADLs, IADLs, coordination, strength, and UE functional use, cognitive skills including memory, problem solving, safety awareness, sequencing, and understand.  IMPAIRMENTS: are limiting patient from ADLs, IADLs, leisure, and social participation.   CO-MORBIDITIES: may have co-morbidities  that affects occupational performance. Patient will benefit from skilled OT to address above impairments and improve overall function.  REHAB POTENTIAL: Good  PLAN:  OT FREQUENCY: 1x/week  OT DURATION: 8 weeks  PLANNED INTERVENTIONS: self care/ADL training, therapeutic exercise, therapeutic activity, neuromuscular re-education, functional mobility training, moist heat, patient/family education, cognitive remediation/compensation, visual/perceptual remediation/compensation, energy conservation, DME and/or AE instructions, and Re-evaluation  RECOMMENDED OTHER SERVICES: none at this time  CONSULTED AND AGREED WITH PLAN OF CARE: Patient  PLAN FOR NEXT SESSION: review coordination HEP; functional/cognitive tasks; higher level coordination;    Dennis Bast, OT 02/10/2022, 10:47 AM

## 2022-02-11 ENCOUNTER — Ambulatory Visit: Payer: Medicare HMO | Attending: Cardiovascular Disease | Admitting: *Deleted

## 2022-02-11 DIAGNOSIS — I480 Paroxysmal atrial fibrillation: Secondary | ICD-10-CM | POA: Diagnosis not present

## 2022-02-11 DIAGNOSIS — Z5181 Encounter for therapeutic drug level monitoring: Secondary | ICD-10-CM

## 2022-02-11 DIAGNOSIS — Z954 Presence of other heart-valve replacement: Secondary | ICD-10-CM | POA: Diagnosis not present

## 2022-02-11 LAB — POCT INR: INR: 5.6 — AB (ref 2.0–3.0)

## 2022-02-11 NOTE — Patient Instructions (Signed)
Description   Do not take any warfarin today and no warfarin tomorrow then continue taking warfarin 1.5 tablets daily, EXCEPT 2 TABLETS ON WEDNESDAYS. Stay consistent with greens each week (2-3 times per week).  Recheck INR in 1 week. Call coumadin clinic for any changes in medications or up coming procedures. [phone].

## 2022-02-15 ENCOUNTER — Encounter: Payer: Self-pay | Admitting: Occupational Therapy

## 2022-02-15 ENCOUNTER — Encounter: Payer: Self-pay | Admitting: Physical Therapy

## 2022-02-15 ENCOUNTER — Ambulatory Visit: Payer: Medicare HMO | Admitting: Occupational Therapy

## 2022-02-15 ENCOUNTER — Ambulatory Visit: Payer: Medicare HMO | Admitting: Speech Pathology

## 2022-02-15 ENCOUNTER — Ambulatory Visit: Payer: Medicare HMO | Admitting: Physical Therapy

## 2022-02-15 VITALS — BP 125/70 | HR 79

## 2022-02-15 DIAGNOSIS — M6281 Muscle weakness (generalized): Secondary | ICD-10-CM

## 2022-02-15 DIAGNOSIS — R41841 Cognitive communication deficit: Secondary | ICD-10-CM | POA: Diagnosis not present

## 2022-02-15 DIAGNOSIS — R2681 Unsteadiness on feet: Secondary | ICD-10-CM

## 2022-02-15 DIAGNOSIS — R269 Unspecified abnormalities of gait and mobility: Secondary | ICD-10-CM | POA: Diagnosis not present

## 2022-02-15 DIAGNOSIS — R4701 Aphasia: Secondary | ICD-10-CM | POA: Diagnosis not present

## 2022-02-15 DIAGNOSIS — R29898 Other symptoms and signs involving the musculoskeletal system: Secondary | ICD-10-CM

## 2022-02-15 DIAGNOSIS — I4819 Other persistent atrial fibrillation: Secondary | ICD-10-CM | POA: Diagnosis not present

## 2022-02-15 DIAGNOSIS — I6200 Nontraumatic subdural hemorrhage, unspecified: Secondary | ICD-10-CM

## 2022-02-15 NOTE — Therapy (Signed)
OUTPATIENT SPEECH LANGUAGE PATHOLOGY TREATMENT   Patient Name: LAMEES GABLE MRN: [id number] DOB:1955-06-29, 67 y.o., female Today's Date: 02/15/2022  PCP: Lennie Odor, [place] REFERRING PROVIDER: Cathlyn Parsons., PA-C  END OF SESSION:  End of Session - 02/15/22 1014     Visit Number 6    Number of Visits 25    Date for SLP Re-Evaluation 03/31/22    Authorization Type [employer]    Progress Note Due on Visit 10    SLP Start Time 1015    SLP Stop Time  1100    SLP Time Calculation (min) 45 min    Activity Tolerance Patient tolerated treatment well                Past Medical History:  Diagnosis Date   Abnormal chest CT    Anemia    Aortic atherosclerosis (HCC)    Asthma    Atrial enlargement, left    severe   Atrial fibrillation ([place]) 01/05/2011   Chronic persistent, failed DCCV    Atrial flutter (HCC)    Bronchospasm 1998   Cardiomegaly    Carotid bruit    Chronic diastolic congestive heart failure ([place])    Diabetes mellitus    Type II   Ejection fraction < 50%    35-40%,    Heart murmur    History of blood transfusion    History of radiation therapy 03/18/19-04/15/19   endometrial - vaginal brachytherapy -  Dr. Sondra Come    Hypercholesterolemia    Hypertension    Junctional rhythm 09/14/2018   Noted on EKG   Left bundle branch block (LBBB) 09/14/2018   Noted on EKG   LVH (left ventricular hypertrophy) 10/10/2016   Moderate, Noted on ECHO   Mitral regurgitation    Obesity    Obesity (BMI 30-39.9) 01/16/2012   Persistent atrial fibrillation ([place])    Polyp of rectum    Rheumatic fever 01/16/2012   Reported during childhood   S/P Maze operation for atrial fibrillation 02/15/2012   Complete biatrial lesion set using bipolar radiofrequency and cryothermy ablation with clipping of LA appendage   S/P mitral valve replacement with metallic valve 20/25/4270   4m Sorin [employer] Optiform mechanical prosthesis   S/P tricuspid valve repair  02/15/2012   231mEdwards mc3 ring annuloplasty   Shortness of breath    with exertion   Sleep apnea    DOES NOT HAVE CPAP   [place]   Post op , left arm weakness   Tricuspid regurgitation 01/16/2012   [facility]   Past Surgical History:  Procedure Laterality Date   CARDIAC CATHETERIZATION  >5 years   CARDIOVASCULAR STRESS TEST  09/2010   CARDIOVERSION  03/25/2011   Procedure: CARDIOVERSION;  Surgeon: JaJettie BoozeMD;  [place] Service: Cardiovascular;  Laterality: N/A;   CARDIOVERSION N/A 07/27/2012   Procedure: CARDIOVERSION;  Surgeon: JaJettie BoozeMD;  [facility];  Service: Cardiovascular;  Laterality: N/A;   CESAREAN SECTION     COLONOSCOPY WITH PROPOFOL N/A 04/27/2016   Procedure: COLONOSCOPY WITH PROPOFOL;  Surgeon: ViWilford CornerMD;  [facility];  Service: Endoscopy;  Laterality: N/A;   CRANIOTOMY Left 12/03/2021   Procedure: FRONTOTEMPORAL CRANIOTOMY FOR EVACUATION SUBDURAL HEMATOMA;  Surgeon: DaKarsten RoDO;  [facility] [facility] [place] Service: Neurosurgery;  Laterality: Left;   INTRAOPERATIVE TRANSESOPHAGEAL ECHOCARDIOGRAM  02/15/2012   Procedure: INTRAOPERATIVE TRANSESOPHAGEAL ECHOCARDIOGRAM;  Surgeon: ClRexene AlbertsMD;  [facility];  Service: Open Heart Surgery;  Laterality: N/A;   MAZE  02/15/2012   Procedure: MAZE;  Surgeon: Rexene Alberts, MD;  Location: [place];  Service: Open Heart Surgery;  Laterality: N/A;   MITRAL VALVE REPLACEMENT  02/15/2012   Procedure: MITRAL VALVE (MV) REPLACEMENT;  Surgeon: Rexene Alberts, MD;  Location: [place];  Service: Open Heart Surgery;  Laterality: N/A;   ROBOTIC ASSISTED LAPAROSCOPIC HYSTERECTOMY AND SALPINGECTOMY Bilateral 01/02/2019   Procedure: XI ROBOTIC ASSISTED LAPAROSCOPIC TOTAL HYSTERECTOMY WITH BILATERAL SALPINGOOPHORECTOMY, SENTINEL LYMPH NODE BIOPSY;  Surgeon: [name], MD;  Location: [facility];  Service: Gynecology;  Laterality: Bilateral;   TEE  WITHOUT CARDIOVERSION  12/21/2011   Procedure: TRANSESOPHAGEAL ECHOCARDIOGRAM (TEE);  Surgeon: Jettie Booze, MD;  Location: [place];  Service: Cardiovascular;  Laterality: N/A;   TOOTH EXTRACTION N/A 11/19/2021   Procedure: DENTAL EXTRACTION TEETH NUMBER 14, 19, 30, 31;  Surgeon: Diona Browner, DMD;  Location: [place];  Service: Oral Surgery;  Laterality: N/A;   TRICUSPID VALVE REPLACEMENT  02/15/2012   Procedure: TRICUSPID VALVE REPAIR;  Surgeon: Rexene Alberts, MD;  Location: [place];  Service: Open Heart Surgery;  Laterality: N/A;   TUBAL LIGATION     at time of her c-section   Patient Active Problem List   Diagnosis Date Noted   Hypertension associated with diabetes ([place]) 12/08/2021   Anticoagulated on warfarin 12/07/2021   Acute subdural hematoma ([place]) 12/02/2021   Chronic anticoagulation - on coumadin for mechanical mitral valve, afib 12/02/2021   Subdural hematoma, nontraumatic ([place]) 12/02/2021   Pancytopenia, acquired ([place]) 06/21/2019   Peripheral neuropathy due to chemotherapy ([place]) 06/21/2019   Anemia, chronic disease 05/21/2019   Drug-induced hyperglycemia 02/25/2019   Stage 3a chronic kidney disease (CKD) ([place]) 02/04/2019   Iron deficiency anemia 01/18/2019   Endometrial cancer ([place]) 01/02/2019   Uterine cancer ([place]) 12/27/2018   Special screening for malignant neoplasms, colon 04/27/2016   LBBB (left bundle branch block) 10/07/2013   Encounter for therapeutic drug monitoring 03/19/2013   Mitral valve disorders(424.0) 10/12/2012   Heart valve replaced by other means 10/12/2012   Atrial flutter ([place])    CVA (cerebral infarction) 04/14/2012   S/P TVR (tricuspid valve repair) 03/19/2012   S/P MVR (mitral valve replacement) 03/19/2012   History of mitral valve replacement with mechanical valve 02/15/2012   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   S/P Maze operation for atrial fibrillation 02/15/2012   CHF (congestive heart failure) ([place]) 02/06/2012   MR (mitral  regurgitation) 02/06/2012   Tricuspid regurgitation 01/16/2012   Rheumatic fever 01/16/2012   Obesity (BMI 30-39.9) 01/16/2012   Mitral regurgitation    Chronic diastolic CHF (congestive heart failure) ([place]) 12/19/2011   Snoring 02/28/2011   Atrial fibrillation ([place]) 55/73/2202   Chronic systolic dysfunction of left ventricle 01/05/2011   Hypertension 10/18/2010   Type 2 diabetes mellitus ([place]) 10/18/2010   Hypercholesterolemia 10/18/2010   Mediastinal lymphadenopathy 10/18/2010   Asthma 10/18/2010    ONSET DATE: 12-02-2021   REFERRING DIAG: R42.5XAA (ICD-10-CM) - SDH (subdural hematoma) ([place])   THERAPY DIAG:  Aphasia  Rationale for Evaluation and Treatment: Rehabilitation  SUBJECTIVE:   SUBJECTIVE STATEMENT: "I'm good"  PERTINENT HISTORY: L-[facility] 2014, dental procedure 11-19-2021 resulting in ongoing bleeding. Presented to ED 12-02-2021, imaging revealed acute on chronic SDH. While admitted began showing speech disturbances. Underwent L frontotemporal craniotomy for evacuation of hematoma 12-03-2021. Received ST services while admitted to inpatient rehab.   PAIN:  Are you having pain? No  FALLS: Has patient fallen in last 6 months?  No  PATIENT GOALS: "do how I did before"  OBJECTIVE:   TODAY'S TREATMENT:   02-15-22: Pt reported doing her HEP (divergent naming task). Clinician provided resources to facilitate further HEP and encourage pt o engage in activities that support anomia strategies. Pt endorsed that she enjoys word searches and has recently started doing them again. Provided occasional verbal prompts, pt participated in generative naming activity in which she produced 10/10 relevant words in target category (clothes). SLP then demonstrated how pt can utilize word searches as a tool to practice generative naming as HEP. Pt agreeable to HEP.   02-10-22: Pt reported that she has been doing her HEP with counting. She did not use her external aid; she worked through the  challenge. She stated she has not been practicing her reading and writing as much. SLP provided re-education of importance and benefit of practice at home. SLP led pt in divergent naming task where she produced 20/26 target words, benefiting from occasional semantic cues. SLP demonstrated modified semantic feature analysis utilizing gestural, phonemic, and semantic cues to aid pt in producing target word. Provided education on use of description as anomia strategy with goal to keep conversation moving by self-cueing or getting A from communication partner. Updated HEP: Categorical naming activity   02-01-22: Given phoneme, pt able to relay accurate letter and sound in 100% of trials. Reads simples words in 5/5 trials. Pt requests to work on counting, is finding self repeating numbers impacting successful counting. SLP educates on use of external aids and demonstrates use of to aid in accurate counting up to 200. Pt able to count from 1-200 with rare errors, ~95% accuracy. Self-ID 100% of trials with use of external aid. SLP demonstrates limiting visual field with pt able to carryover with mod-I PRN during remainder of task. Occasional need for extra time to process and determine correct number. SLP provided feedback on slowing rate resulting in improved fluency during counting task.   01-27-22: SLP reviewed HEP and facilitated direct instruction for challenging items. Use of additional, similar stimuli employed to reinforce challenging phoneme/grapheme correspondence. Pt evidencing sub-optimal phoneme/grapheme correspondence at sound level which is barrier for reading and spelling. SLP provided pt with comprehensive list of orthographic representations of consonant phonemes and consonant blends to assess which require additional instruction. Of 31, pt requires model and verbal cueing to produce correct phoneme for 19. SLP provides key words, articulatory cues, and descriptive representations to reinforce target  sound. Pt evidencing increased difficulty with x5 blends addressed today's session. Will continue to work on strengthening this understanding next session.   PATIENT EDUCATION: Education details: see above Person educated: Patient Education method: Consulting civil engineer, Demonstration, Verbal cues, and Handouts Education comprehension: verbalized understanding, returned demonstration, and needs further education   GOALS: Goals reviewed with patient? Yes  SHORT TERM GOALS: Target date: 02-03-2022  Pt will successfully complete moderately complex language tasks in 90% of opportunities given occasional min-A over 2 sessions Baseline: Goal status: NOT MET  2.  Pt will write 18/20 trained words accurately with occasional min-A  Baseline:  Goal status: MET  3.  During structured exercise, pt will successful demonstrate anomia strategy with rare min-A in 80% of trials  Baseline:  Goal status: PARTIALLY MET  4.  Pt will ID and attempt to correct paraphasia during structured activities in 50% of opportunities  Baseline:  Goal status: MET   LONG TERM GOALS: Target date: 03-31-2022  Pt will complete  HEP 5/7 days over 2 week period with min-A from family  Baseline:  Goal status: IN PROGRESS  2.  Pt will report improvement via patient reported outcome measure by d/c Baseline: to be completed initial therapy session Goal status: IN PROGRESS  3.  Pt will ID and attempt to correct paraphasia during structured activities in 80% of opportunities  Baseline:  Goal status: IN PROGRESS  4.  Pt will use external support to A in reading of information on phone with mod-I following direct instruction Baseline:  Goal status: IN PROGRESS  5.  Pt will generate approximate spelling of untrained words, efficacy or which to be judged by SLP being able to determine target in 80% of trials  Baseline:  Goal status: IN PROGRESS   ASSESSMENT:  CLINICAL IMPRESSION: SLP has provided skilled interventions  targeting reading, writing, anomia strategies and compensations. This session focused on taxing language system to A in word finding with direct instruction for using description strategy as anomia compensation. When anomia occurs, pt with occasional benefit from semantic and phonemic cueing. Continue per POC.   OBJECTIVE IMPAIRMENTS: include memory, executive functioning, receptive language, aphasia, and apraxia. These impairments are limiting patient from managing medications, managing appointments, managing finances, household responsibilities, ADLs/IADLs, and effectively communicating at home and in community. Factors affecting potential to achieve goals and functional outcome are previous level of function, severity of impairments, financial resources, and family/community support. Patient will benefit from skilled SLP services to address above impairments and improve overall function.  REHAB POTENTIAL: Fair (prior level of function s/p stroke 2014)  PLAN:  SLP FREQUENCY: 1-2x/week - SLP recommends 2x a week, pt elects to initiate therapy at 1x a week   SLP DURATION: 12 weeks  PLANNED INTERVENTIONS: Language facilitation, Cueing hierachy, Cognitive reorganization, Internal/external aids, Functional tasks, Multimodal communication approach, SLP instruction and feedback, Compensatory strategies, and Patient/family education  Leroy Libman, [place] 02/15/2022, 10:21 AM

## 2022-02-15 NOTE — Therapy (Signed)
OUTPATIENT OCCUPATIONAL THERAPY NEURO TREATMENT  Patient Name: [name] MRN: [id number] DOB:1955-07-31, 67 y.o., female Today's Date: 02/15/2022  PCP: Lennie Odor, [place]  REFERRING PROVIDER: Cathlyn Parsons, PA-C  END OF SESSION:  OT End of Session - 02/15/22 1127     Visit Number 3    Number of Visits 9    Date for OT Re-Evaluation 03/04/22    Authorization Type [employer] - requires auth    Progress Note Due on Visit 9    OT Start Time 1102    OT Stop Time 1144    OT Time Calculation (min) 42 min    Activity Tolerance Patient tolerated treatment well    Behavior During Therapy WFL for tasks assessed/performed              Past Medical History:  Diagnosis Date   Abnormal chest CT    Anemia    Aortic atherosclerosis (HCC)    Asthma    Atrial enlargement, left    severe   Atrial fibrillation ([place]) 01/05/2011   Chronic persistent, failed DCCV    Atrial flutter ([place])    Bronchospasm 1998   Cardiomegaly    Carotid bruit    Chronic diastolic congestive heart failure ([place])    Diabetes mellitus    Type II   Ejection fraction < 50%    35-40%,    Heart murmur    History of blood transfusion    History of radiation therapy 03/18/19-04/15/19   endometrial - vaginal brachytherapy -  Dr. Sondra Come    Hypercholesterolemia    Hypertension    Junctional rhythm 09/14/2018   Noted on EKG   Left bundle branch block (LBBB) 09/14/2018   Noted on EKG   LVH (left ventricular hypertrophy) 10/10/2016   Moderate, Noted on ECHO   Mitral regurgitation    Obesity    Obesity (BMI 30-39.9) 01/16/2012   Persistent atrial fibrillation ([place])    Polyp of rectum    Rheumatic fever 01/16/2012   Reported during childhood   S/P Maze operation for atrial fibrillation 02/15/2012   Complete biatrial lesion set using bipolar radiofrequency and cryothermy ablation with clipping of LA appendage   S/P mitral valve replacement with metallic valve 01/02/8249   51m Sorin [employer]  Optiform mechanical prosthesis   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   268mEdwards mc3 ring annuloplasty   Shortness of breath    with exertion   Sleep apnea    DOES NOT HAVE CPAP   Stroke ([facility]   Post op , left arm weakness   Tricuspid regurgitation 01/16/2012   [facility]   Past Surgical History:  Procedure Laterality Date   CARDIAC CATHETERIZATION  >5 years   CARDIOVASCULAR STRESS TEST  09/2010   CARDIOVERSION  03/25/2011   Procedure: CARDIOVERSION;  Surgeon: JaJettie BoozeMD;  [facility] Service: Cardiovascular;  Laterality: N/A;   CARDIOVERSION N/A 07/27/2012   Procedure: CARDIOVERSION;  Surgeon: JaJettie BoozeMD;  [facility] Service: Cardiovascular;  Laterality: N/A;   CESAREAN SECTION     COLONOSCOPY WITH PROPOFOL N/A 04/27/2016   Procedure: COLONOSCOPY WITH PROPOFOL;  Surgeon: ViWilford CornerMD;  [facility];  Service: Endoscopy;  Laterality: N/A;   CRANIOTOMY Left 12/03/2021   Procedure: FRONTOTEMPORAL CRANIOTOMY FOR EVACUATION SUBDURAL HEMATOMA;  Surgeon: DaKarsten RoDO;  [facility] Service: Neurosurgery;  Laterality: Left;   INTRAOPERATIVE TRANSESOPHAGEAL ECHOCARDIOGRAM  02/15/2012   Procedure: INTRAOPERATIVE  TRANSESOPHAGEAL ECHOCARDIOGRAM;  Surgeon: Rexene Alberts, MD;  Location: [place];  Service: Open Heart Surgery;  Laterality: N/A;   MAZE  02/15/2012   Procedure: MAZE;  Surgeon: Rexene Alberts, MD;  Location: [place];  Service: Open Heart Surgery;  Laterality: N/A;   MITRAL VALVE REPLACEMENT  02/15/2012   Procedure: MITRAL VALVE (MV) REPLACEMENT;  Surgeon: Rexene Alberts, MD;  Location: [place];  Service: Open Heart Surgery;  Laterality: N/A;   ROBOTIC ASSISTED LAPAROSCOPIC HYSTERECTOMY AND SALPINGECTOMY Bilateral 01/02/2019   Procedure: XI ROBOTIC ASSISTED LAPAROSCOPIC TOTAL HYSTERECTOMY WITH BILATERAL SALPINGOOPHORECTOMY, SENTINEL LYMPH NODE BIOPSY;  Surgeon: [name], MD;  [facility];  Service: Gynecology;  Laterality: Bilateral;   TEE WITHOUT CARDIOVERSION  12/21/2011   Procedure: TRANSESOPHAGEAL ECHOCARDIOGRAM (TEE);  Surgeon: Jettie Booze, MD;  Location: [place];  Service: Cardiovascular;  Laterality: N/A;   TOOTH EXTRACTION N/A 11/19/2021   Procedure: DENTAL EXTRACTION TEETH NUMBER 14, 19, 30, 31;  Surgeon: Diona Browner, DMD;  Location: [place];  Service: Oral Surgery;  Laterality: N/A;   TRICUSPID VALVE REPLACEMENT  02/15/2012   Procedure: TRICUSPID VALVE REPAIR;  Surgeon: Rexene Alberts, MD;  Location: [place];  Service: Open Heart Surgery;  Laterality: N/A;   TUBAL LIGATION     at time of her c-section   Patient Active Problem List   Diagnosis Date Noted   Hypertension associated with diabetes ([place]) 12/08/2021   Anticoagulated on warfarin 12/07/2021   Acute subdural hematoma ([place]) 12/02/2021   Chronic anticoagulation - on coumadin for mechanical mitral valve, afib 12/02/2021   Subdural hematoma, nontraumatic ([place]) 12/02/2021   Pancytopenia, acquired ([place]) 06/21/2019   Peripheral neuropathy due to chemotherapy ([place]) 06/21/2019   Anemia, chronic disease 05/21/2019   Drug-induced hyperglycemia 02/25/2019   Stage 3a chronic kidney disease (CKD) ([place]) 02/04/2019   Iron deficiency anemia 01/18/2019   Endometrial cancer ([place]) 01/02/2019   Uterine cancer ([place]) 12/27/2018   Special screening for malignant neoplasms, colon 04/27/2016   LBBB (left bundle branch block) 10/07/2013   Encounter for therapeutic drug monitoring 03/19/2013   Mitral valve disorders(424.0) 10/12/2012   Heart valve replaced by other means 10/12/2012   Atrial flutter ([place])    CVA (cerebral infarction) 04/14/2012   S/P TVR (tricuspid valve repair) 03/19/2012   S/P MVR (mitral valve replacement) 03/19/2012   History of mitral valve replacement with mechanical valve 02/15/2012   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   S/P Maze operation for atrial fibrillation 02/15/2012   CHF  (congestive heart failure) ([place]) 02/06/2012   MR (mitral regurgitation) 02/06/2012   Tricuspid regurgitation 01/16/2012   Rheumatic fever 01/16/2012   Obesity (BMI 30-39.9) 01/16/2012   Mitral regurgitation    Chronic diastolic CHF (congestive heart failure) ([place]) 12/19/2011   Snoring 02/28/2011   Atrial fibrillation ([place]) 61/44/3154   Chronic systolic dysfunction of left ventricle 01/05/2011   Hypertension 10/18/2010   Type 2 diabetes mellitus ([place]) 10/18/2010   Hypercholesterolemia 10/18/2010   Mediastinal lymphadenopathy 10/18/2010   Asthma 10/18/2010    ONSET DATE: Pt had teeth extracted on 01/19/2021 with continued bleeding; she presented the ED on 12/01/2021; unknown onset date of SDH; admitted to IRF 12/10/2021; discharged 12/16/2021  REFERRING DIAG: S06.5XAA (ICD-10-CM) - SDH (subdural hematoma)   THERAPY DIAG:  Muscle weakness (generalized)  Other symptoms and signs involving the musculoskeletal system  Subdural hematoma, nontraumatic (HCC)  Rationale for Evaluation and Treatment: Rehabilitation  SUBJECTIVE:   SUBJECTIVE STATEMENT: She has been completing L coordination  HEP but does not like to play or use cards.   Pt accompanied by: self  PERTINENT HISTORY:   "ADAJA WANDER is a 67 y.o. female with history of CHF, OSA, CKD, MVR-chronic Coumadin, left MCA stroke 2014, recent dental extraction who was evaluated in the ED 12/02/21 due to issues with ongoing bleeding since her procedure.  She will did report headaches which was followed by somnolence with confusion and CT of head done showing acute on chronic SDH with 16 mm shift to the left, 8 mm left to right midline shift and effacement of left greater than right psych eye.  INR at admission was 2.4 and was reversed with Kcentra.  Dr. Reatha Armour was consulted and she underwent left frontotemporal craniotomy for evacuation of hematoma on 11/24.  She was started on Keppra for seizure prophylaxis and cleared to start IV heparin on  11/27.  Recommendations were to repeat CT once heparin was therapeutic prior to transition to oral AC.     On 11/29 she had significant worsening of aphasia, problems with ADL and decreased ability to follow commands with more of a left bias with left gaze preference.  EEG was done showing area of epileptogenicity arising from the left frontal region with cortical dysfunction left hemisphere.  Repeat CT head showed mixed density left frontal parietal and temporal lobe which was unchanged and new small right temporal SDH which was unchanged.  She was cleared to resume Coumadin.  She had improvement in motor planning deficits with no overt left neglect or bias per recent therapy notes.  She continued to be intact bilaterally, motor planning deficits, minimal verbalization with decreasing carryover.  CIR was recommended due to functional decline."  PRECAUTIONS: Fall and Other: No driving for at least 6 months or  till cleared by neurology.   WEIGHT BEARING RESTRICTIONS: No  PAIN:  Are you having pain? No  FALLS: Has patient fallen in last 6 months? No  LIVING ENVIRONMENT: Lives with: lives with their spouse and lives with their son Lives in: House/apartment Stairs: Yes: External: 1 steps; none Has following equipment at home: Quad cane large base, shower chair, and bed side commode  PLOF: Independent; driving; picked up grandkids  PATIENT GOALS: return to PLOF  OBJECTIVE: (from evaluation unless otherwise noted)  HAND DOMINANCE: Right  ADLs: Overall ADLs: mod I  IADLs: Requires min A with most due to mobility and cognition  MOBILITY STATUS: Independent  ACTIVITY TOLERANCE: Activity tolerance: good  FUNCTIONAL OUTCOME MEASURES: FOTO: Severity: Slight (Intake FS: 88) ; expected score at d/c: 95  UPPER EXTREMITY ROM:     AROM Right (eval) Left (eval)  Shoulder flexion WNL WNL  Shoulder abduction WNL WNL  Elbow flexion WNL WNL  Elbow extension WNL WNL  Wrist flexion WNL WNL   Wrist extension WNL WNL  Wrist pronation WNL WNL  Wrist supination WNL WNL   Digit Composite Flexion WNL WNL  Digit Composite Extension WNL WNL  Digit Opposition WNL Dyskinesia noted  (Blank rows = not tested)  UPPER EXTREMITY MMT:     MMT Right (eval) Left (eval)  Shoulder flexion WNL WNL  Shoulder abduction WNL WNL  Elbow flexion WNL WNL  Elbow extension WNL WNL  (Blank rows = not tested)  HAND FUNCTION: Grip strength: Right: 66.3 lbs; Left: 46.7 lbs 02/15/2022 - 49.6 lbs   COORDINATION: 9 Hole Peg test: Right: 31 sec; Left: 40 sec  SENSATION: WFL  EDEMA: none  MUSCLE TONE: RUE: Within functional limits and  LUE: Within functional limits  COGNITION: Overall cognitive status: Impaired; see ST eval  VISION: Subjective report: no changes Baseline vision: Wears glasses for reading only PRN; chose not to drive at night Visual history:  n/a  VISION ASSESSMENT: WFL  PERCEPTION: WFL  PRAXIS: WFL  OBSERVATIONS: Pt ambulates with quadcane. No LOB noted. Pt appears well kept and is able to answer most questions appropriately with increased time. Pt able to write full name without error, but when asked to write the day of the week, "Thursday", she wrote "Thurday".    TODAY'S TREATMENT:                                                                                                                              - Therapeutic activities completed for duration as noted below including:  Patient used L hand to assemble small [place] tower, and then removed 8 pieces individually until the tower fell over for fine motor coordination of affected extremity.  Wooden dot puzzle with L for cognition and coordination.   - Therapeutic exercises completed for duration as noted below including:   OT reviewed LUE coordination [place] as noted in patient instructions including pick up and placement of items, item stacking and unstacking, rolling a pen in between fingers and  thumb, picking up and storing items in hand, and then translating stored items to tips of thumb and index finger before placing them individually into a container. Patient required min cues for proper completion.   Wrist flex and ext with yellow flex bar x 2 min for strength and endurance of affected extremity  Supination with yellow flex bar x 2 min for strength and endurance of affected extremity  Pronation with yellow flex bar x 2 min for strength and endurance of affected extremity  Placement and removal of yellow, red, green, blue, and black resistive clips with use of L 3 point pinch for strengthening of affected extremity.  Conical grasp with L hand and stacking of cones 2 sets of 8 using R hand to stabilize for coordination and strength of affected extremity.  PATIENT EDUCATION: Education details: HEP Person educated: Patient Education method: Consulting civil engineer, Media planner, Verbal cues, and Handouts Education comprehension: verbalized understanding, returned demonstration, and needs further education  HOME EXERCISE PROGRAM: 12/28 - LUE red putty hep 2/1 - L coordination HEP   GOALS:  SHORT TERM GOALS: Target date: 02/03/2022   Patient will be independent with initial LUE HEP. Baseline: Goal status: MET  2.  Patient will demonstrate at least 50 lbs L grip strength as needed to open jars and other containers. Baseline: 46.7 lbs 2/6: 49.6 lbs Goal status: IN PROGRESS   LONG TERM GOALS: Target date: 03/04/2022    Patient will demonstrate updated LUE HEP with 25% verbal cues or less for proper execution. Baseline:  Goal status: INITIAL  2.  Patient will demonstrate at least 56.7 lbs L grip strength as needed to open jars and  other containers. Baseline: 46.7 lbs Goal status:  IN PROGRESS  3.  Patient will complete nine-hole peg with use of L in 32 seconds or less. Baseline: 40 seconds Goal status: INITIAL  4.  Pt will complete FOTO assessment at time of discharge  scoring 95 or greater indicating functional progression with ADL and IADL completion.. Baseline: 88 Goal status: INITIAL  5.  Pt will report completing typical IADLs mod I with good safety as evidenced in therapy clinic.  Baseline: requiring at least min A with supervision due to cognition Goal status: INITIAL  ASSESSMENT:  CLINICAL IMPRESSION: Pt would benefit from skilled OT services in the outpatient setting to work on impairments as noted below to help pt return to PLOF as able.    PERFORMANCE DEFICITS: in functional skills including ADLs, IADLs, coordination, strength, and UE functional use, cognitive skills including memory, problem solving, safety awareness, sequencing, and understand.  IMPAIRMENTS: are limiting patient from ADLs, IADLs, leisure, and social participation.   CO-MORBIDITIES: may have co-morbidities  that affects occupational performance. Patient will benefit from skilled OT to address above impairments and improve overall function.  REHAB POTENTIAL: Good  PLAN:  OT FREQUENCY: 1x/week  OT DURATION: 8 weeks  PLANNED INTERVENTIONS: self care/ADL training, therapeutic exercise, therapeutic activity, neuromuscular re-education, functional mobility training, moist heat, patient/family education, cognitive remediation/compensation, visual/perceptual remediation/compensation, energy conservation, DME and/or AE instructions, and Re-evaluation  RECOMMENDED OTHER SERVICES: none at this time  CONSULTED AND AGREED WITH PLAN OF CARE: Patient  PLAN FOR NEXT SESSION: functional/cognitive tasks; higher level coordination; L hand strengthening   Dennis Bast, OT 02/15/2022, 11:49 AM

## 2022-02-15 NOTE — Therapy (Signed)
OUTPATIENT PHYSICAL THERAPY NEURO TREATMENT   Patient Name: [name] MRN: [id number] DOB:12-28-1955, 67 y.o., female Today's Date: 02/15/2022   PCP: Lennie Odor, [place] REFERRING PROVIDER: Cathlyn Parsons, PA-C  END OF SESSION:  PT End of Session - 02/15/22 0934     Visit Number 6    Number of Visits 7    Date for PT Re-Evaluation 03/03/22    Authorization Type [employer]    PT Start Time 0930    PT Stop Time 0938    PT Time Calculation (min) 45 min    Equipment Utilized During Treatment Gait belt    Activity Tolerance Patient tolerated treatment well    Behavior During Therapy WFL for tasks assessed/performed             Past Medical History:  Diagnosis Date   Abnormal chest CT    Anemia    Aortic atherosclerosis (HCC)    Asthma    Atrial enlargement, left    severe   Atrial fibrillation ([place]) 01/05/2011   Chronic persistent, failed DCCV    Atrial flutter ([place])    Bronchospasm 1998   Cardiomegaly    Carotid bruit    Chronic diastolic congestive heart failure ([place])    Diabetes mellitus    Type II   Ejection fraction < 50%    35-40%,    Heart murmur    History of blood transfusion    History of radiation therapy 03/18/19-04/15/19   endometrial - vaginal brachytherapy -  Dr. Sondra Come    Hypercholesterolemia    Hypertension    Junctional rhythm 09/14/2018   Noted on EKG   Left bundle branch block (LBBB) 09/14/2018   Noted on EKG   LVH (left ventricular hypertrophy) 10/10/2016   Moderate, Noted on ECHO   Mitral regurgitation    Obesity    Obesity (BMI 30-39.9) 01/16/2012   Persistent atrial fibrillation ([place])    Polyp of rectum    Rheumatic fever 01/16/2012   Reported during childhood   S/P Maze operation for atrial fibrillation 02/15/2012   Complete biatrial lesion set using bipolar radiofrequency and cryothermy ablation with clipping of LA appendage   S/P mitral valve replacement with metallic valve 18/29/9371   10m Sorin [employer]  Optiform mechanical prosthesis   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   27mEdwards mc3 ring annuloplasty   Shortness of breath    with exertion   Sleep apnea    DOES NOT HAVE CPAP   [id number]   Post op , left arm weakness   Tricuspid regurgitation 01/16/2012   [facility]   Past Surgical History:  Procedure Laterality Date   CARDIAC CATHETERIZATION  >5 years   CARDIOVASCULAR STRESS TEST  09/2010   CARDIOVERSION  03/25/2011   Procedure: CARDIOVERSION;  Surgeon: JaJettie BoozeMD;  [facility] Service: Cardiovascular;  Laterality: N/A;   CARDIOVERSION N/A 07/27/2012   Procedure: CARDIOVERSION;  Surgeon: JaJettie BoozeMD;  [facility] [place] Service: Cardiovascular;  Laterality: N/A;   CESAREAN SECTION     COLONOSCOPY WITH PROPOFOL N/A 04/27/2016   Procedure: COLONOSCOPY WITH PROPOFOL;  Surgeon: ViWilford CornerMD;  [facility];  Service: Endoscopy;  Laterality: N/A;   CRANIOTOMY Left 12/03/2021   Procedure: FRONTOTEMPORAL CRANIOTOMY FOR EVACUATION SUBDURAL HEMATOMA;  Surgeon: DaKarsten RoDO;  [facility]: [facility] Service: Neurosurgery;  Laterality: Left;   INTRAOPERATIVE TRANSESOPHAGEAL ECHOCARDIOGRAM  02/15/2012   Procedure: INTRAOPERATIVE TRANSESOPHAGEAL ECHOCARDIOGRAM;  Surgeon: Rexene Alberts, MD;  Location: [place];  Service: Open Heart Surgery;  Laterality: N/A;   MAZE  02/15/2012   Procedure: MAZE;  Surgeon: Rexene Alberts, MD;  Location: [place];  Service: Open Heart Surgery;  Laterality: N/A;   MITRAL VALVE REPLACEMENT  02/15/2012   Procedure: MITRAL VALVE (MV) REPLACEMENT;  Surgeon: Rexene Alberts, MD;  Location: [place];  Service: Open Heart Surgery;  Laterality: N/A;   ROBOTIC ASSISTED LAPAROSCOPIC HYSTERECTOMY AND SALPINGECTOMY Bilateral 01/02/2019   Procedure: XI ROBOTIC ASSISTED LAPAROSCOPIC TOTAL HYSTERECTOMY WITH BILATERAL SALPINGOOPHORECTOMY, SENTINEL LYMPH NODE BIOPSY;  Surgeon: [name], MD;  [facility];  Service: Gynecology;  Laterality: Bilateral;   TEE WITHOUT CARDIOVERSION  12/21/2011   Procedure: TRANSESOPHAGEAL ECHOCARDIOGRAM (TEE);  Surgeon: Jettie Booze, MD;  Location: [place];  Service: Cardiovascular;  Laterality: N/A;   TOOTH EXTRACTION N/A 11/19/2021   Procedure: DENTAL EXTRACTION TEETH NUMBER 14, 19, 30, 31;  Surgeon: Diona Browner, DMD;  Location: [place];  Service: Oral Surgery;  Laterality: N/A;   TRICUSPID VALVE REPLACEMENT  02/15/2012   Procedure: TRICUSPID VALVE REPAIR;  Surgeon: Rexene Alberts, MD;  Location: [place];  Service: Open Heart Surgery;  Laterality: N/A;   TUBAL LIGATION     at time of her c-section   Patient Active Problem List   Diagnosis Date Noted   Hypertension associated with diabetes ([place]) 12/08/2021   Anticoagulated on warfarin 12/07/2021   Acute subdural hematoma ([place]) 12/02/2021   Chronic anticoagulation - on coumadin for mechanical mitral valve, afib 12/02/2021   Subdural hematoma, nontraumatic ([place]) 12/02/2021   Pancytopenia, acquired ([place]) 06/21/2019   Peripheral neuropathy due to chemotherapy ([place]) 06/21/2019   Anemia, chronic disease 05/21/2019   Drug-induced hyperglycemia 02/25/2019   Stage 3a chronic kidney disease (CKD) ([place]) 02/04/2019   Iron deficiency anemia 01/18/2019   Endometrial cancer ([place]) 01/02/2019   Uterine cancer ([place]) 12/27/2018   Special screening for malignant neoplasms, colon 04/27/2016   LBBB (left bundle branch block) 10/07/2013   Encounter for therapeutic drug monitoring 03/19/2013   Mitral valve disorders(424.0) 10/12/2012   Heart valve replaced by other means 10/12/2012   Atrial flutter ([place])    CVA (cerebral infarction) 04/14/2012   S/P TVR (tricuspid valve repair) 03/19/2012   S/P MVR (mitral valve replacement) 03/19/2012   History of mitral valve replacement with mechanical valve 02/15/2012   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   S/P Maze operation for atrial fibrillation 02/15/2012   CHF  (congestive heart failure) ([place]) 02/06/2012   MR (mitral regurgitation) 02/06/2012   Tricuspid regurgitation 01/16/2012   Rheumatic fever 01/16/2012   Obesity (BMI 30-39.9) 01/16/2012   Mitral regurgitation    Chronic diastolic CHF (congestive heart failure) ([place]) 12/19/2011   Snoring 02/28/2011   Atrial fibrillation ([place]) 15/40/0867   Chronic systolic dysfunction of left ventricle 01/05/2011   Hypertension 10/18/2010   Type 2 diabetes mellitus ([place]) 10/18/2010   Hypercholesterolemia 10/18/2010   Mediastinal lymphadenopathy 10/18/2010   Asthma 10/18/2010    ONSET DATE: 12/15/2021 (referral date)  REFERRING DIAG: S06.5XAA (ICD-10-CM) - SDH (subdural hematoma) (HCC)  THERAPY DIAG:  Muscle weakness (generalized)  Other symptoms and signs involving the musculoskeletal system  Unsteadiness on feet  Abnormality of gait and mobility  Rationale for Evaluation and Treatment: Rehabilitation  SUBJECTIVE:  SUBJECTIVE STATEMENT:  Patient arrives to clinic and reports that she is doing well. She was able to walk the whole grocery store since I last saw her. Denies new falls/medication changes.   Pt accompanied by: self  PERTINENT HISTORY:   Per Note on 12/10/2021:  "Brief HPI:   [name] is a 67 y.o. female with history of CHF, OSA, CKD, MVR-chronic Coumadin, left MCA stroke 2014, recent dental extraction who was evaluated in the ED 12/02/21 due to issues with ongoing bleeding since her procedure.  She will did report headaches which was followed by somnolence with confusion and CT of head done showing acute on chronic SDH with 16 mm shift to the left, 8 mm left to right midline shift and effacement of left greater than right psych eye.  INR at admission was 2.4 and was reversed with Kcentra.  Dr.  Reatha Armour was consulted and she underwent left frontotemporal craniotomy for evacuation of hematoma on 11/24.  She was started on Keppra for seizure prophylaxis and cleared to start IV heparin on 11/27.  Recommendations were to repeat CT once heparin was therapeutic prior to transition to oral [facility]."  Patient transferred to [facility] on 12/10/2021 per notes and saw all three disciplines. Finished skilled physical therapy in that setting on 12/15/2021.  PAIN:  Are you having pain? No  PRECAUTIONS: Fall and ICD/Pacemaker  WEIGHT BEARING RESTRICTIONS: No  FALLS: Has patient fallen in last 6 months?  Patient reports no falls in last 6 months  PATIENT GOALS: "I want my speech to be better. My family want me to work on my safety."  OBJECTIVE:   DIAGNOSTIC FINDINGS:  12/08/2021 CT Head w/o Contrast: "IMPRESSION: 1. Mixed density subdural hematoma on the left is unchanged. Mass-effect with 4 mm midline shift to the right unchanged. 2. Small right temporal subdural hematoma unchanged. 3. No new area of hemorrhage."  Vitals:   02/15/22 0934  BP: 125/70  Pulse: 79    TODAY'S TREATMENT:                                                                                                                               There Ex: Nustep lvl 3 for 10 minutes for reciprocal arm/leg movements, aerobic conditioning, light strengthening   HR: 84 bpm, SPO2: 95%  There Act:   FxSTS without UE use (SBA):  Trial 1: Untimed and unweighted  Trial 2: 17" with 3lb weights hold on shoulder   Trial 3: 19.46" with 3lb weights hold on shoulder  Trial 4: 19.43" with 3lb weights hold on shoulder   SPO2: 100%    Stair climb x 3 min (SBA):  Use of single hand rail (started with L and then switched to R on ascent)     Patient ascends with step through and mix of step through/step to gait pattern with descent no signs of imbalance  SPO2: 93% after 3 minutes activity  2MWT:  123.75 m without AD   HR:  89 bpm, SPO2: 96%  PATIENT EDUCATION: Education details: Continue HEP, SPO2 monitoring moving forward Person educated: Patient Education method: Explanation Education comprehension: verbalized understanding and needs further education  HOME EXERCISE PROGRAM: Access Code: [email or web address] URL: [email or web address] Date: 01/13/2022 Prepared by: Malachi Carl  Exercises - Sit to Stand Without Arm Support  - 1 x daily - 7 x weekly - 3 sets - 5 reps - Corner Balance Feet Together: Eyes Closed With Head Turns  - 1 x daily - 7 x weekly - 3 sets - 10 reps - Standing 3-Way Kick  - 1 x daily - 7 x weekly - 3 sets - 10 reps - Standing Marching  - 1 x daily - 7 x weekly - 3 sets - 10 reps  KB Home	[place] 6 Week Beginner Walking Program - pdf provided (increase gait speed)  GOALS: Goals reviewed with patient? Yes  LONG TERM GOALS: Target date: 03/03/2022 (LTG only given length of POC)  Patient will report demonstrate independence with final HEP in order to maintain current gains and continue to progress after physical therapy discharge.   Baseline: To be provided Goal status: INITIAL  2.  Patient will improve gait speed to 1.1 m/s or greater with LRAD to indicate a reduced risk for falls.   Baseline: 0.81 m/s Goal status: INITIAL  3.  Patient will improve their 5x Sit to Stand score to less than 15 seconds to demonstrate a decreased risk for falls and improved LE strength.   Baseline: 21.76" with decreased eccentric control to sit and mild LOB on final sit to stand and able to recover independently; improved to 16.86" w/o UE and no loss of balance on 02/01/2022; 12.28" on 02/10/2022 w/o UE use Goal status: MET  4.  Patient will improve ABC Score to 67% or greater to indicate a decreased risk for falls and improved self-reported confidence in balance and sense of steadiness.   Baseline: 61.3% Goal status: INITIAL  5.  Patient will improve 2MWT with LRAD to 155.2 m or greater to  achieve age reported norms and demonstrate improved cardiorespiratory endurance and mobility. Baseline: 89.35mGoal status: INITIAL   ASSESSMENT:  CLINICAL IMPRESSION: Initiated session with nustep for reciprocal arm/leg movements, aerobic conditioning, light strengthening. Patient reports moderate LLE fatigue but denies pain. Tolerated session well; mild drops in SPO2 noted during session. Recommended monitoring at home and educated on safe ranges to ensure safe readings at home with activity moving forward. Plan for D/C next session. Patient will benefit from skilled physical therapy services to continue to progress mobility and decrease risk for falls.   OBJECTIVE IMPAIRMENTS: Abnormal gait, decreased balance, decreased knowledge of use of DME, decreased mobility, difficulty walking, decreased strength, decreased safety awareness, impaired sensation, and impaired tone.   ACTIVITY LIMITATIONS: standing, squatting, and caring for others  PARTICIPATION LIMITATIONS: driving and community activity  PERSONAL FACTORS: Age, Past/current experiences, and 1-2 comorbidities: see above  are also affecting patient's functional outcome.   REHAB POTENTIAL: Good  CLINICAL DECISION MAKING: Evolving/moderate complexity  EVALUATION COMPLEXITY: Moderate  PLAN:  PT FREQUENCY: 1x/week  PT DURATION: 8 weeks  PLANNED INTERVENTIONS: Therapeutic exercises, Therapeutic activity, Neuromuscular re-education, Gait training, Self Care, and Manual therapy  PLAN FOR NEXT SESSION: Plan to D/C next session with updated HEP, review goals  SMalachi Carl PT, DPT  02/15/2022, 12:00 PM

## 2022-02-18 ENCOUNTER — Ambulatory Visit: Payer: Medicare HMO | Attending: Cardiovascular Disease

## 2022-02-18 DIAGNOSIS — Z954 Presence of other heart-valve replacement: Secondary | ICD-10-CM

## 2022-02-18 DIAGNOSIS — Z5181 Encounter for therapeutic drug level monitoring: Secondary | ICD-10-CM | POA: Diagnosis not present

## 2022-02-18 DIAGNOSIS — I480 Paroxysmal atrial fibrillation: Secondary | ICD-10-CM | POA: Diagnosis not present

## 2022-02-18 LAB — POCT INR: INR: 3.8 — AB (ref 2.0–3.0)

## 2022-02-18 NOTE — Patient Instructions (Signed)
Description   Only take 1 tablet today and then START taking warfarin 1.5 tablets daily.  Stay consistent with greens each week (2-3 times per week).  Recheck INR in 2 weeks.  Call coumadin clinic for any changes in medications or up coming procedures. [phone].

## 2022-02-22 ENCOUNTER — Encounter: Payer: Self-pay | Admitting: Occupational Therapy

## 2022-02-22 ENCOUNTER — Ambulatory Visit: Payer: Medicare HMO | Admitting: Speech Pathology

## 2022-02-22 ENCOUNTER — Ambulatory Visit: Payer: Medicare HMO | Admitting: Occupational Therapy

## 2022-02-22 ENCOUNTER — Encounter: Payer: Self-pay | Admitting: Physical Therapy

## 2022-02-22 ENCOUNTER — Ambulatory Visit: Payer: Medicare HMO | Admitting: Physical Therapy

## 2022-02-22 DIAGNOSIS — R41841 Cognitive communication deficit: Secondary | ICD-10-CM | POA: Diagnosis not present

## 2022-02-22 DIAGNOSIS — R29898 Other symptoms and signs involving the musculoskeletal system: Secondary | ICD-10-CM

## 2022-02-22 DIAGNOSIS — M6281 Muscle weakness (generalized): Secondary | ICD-10-CM

## 2022-02-22 DIAGNOSIS — R4701 Aphasia: Secondary | ICD-10-CM | POA: Diagnosis not present

## 2022-02-22 DIAGNOSIS — I6200 Nontraumatic subdural hemorrhage, unspecified: Secondary | ICD-10-CM | POA: Diagnosis not present

## 2022-02-22 DIAGNOSIS — R2681 Unsteadiness on feet: Secondary | ICD-10-CM

## 2022-02-22 DIAGNOSIS — I4819 Other persistent atrial fibrillation: Secondary | ICD-10-CM | POA: Diagnosis not present

## 2022-02-22 DIAGNOSIS — R269 Unspecified abnormalities of gait and mobility: Secondary | ICD-10-CM

## 2022-02-22 NOTE — Therapy (Signed)
OUTPATIENT OCCUPATIONAL THERAPY NEURO TREATMENT  Patient Name: [name] MRN: [id number] DOB:16-May-1955, 67 y.o., female Today's Date: 02/22/2022  PCP: Lennie Odor, PA  REFERRING PROVIDER: Cathlyn Parsons, PA-C  END OF SESSION:     Past Medical History:  Diagnosis Date   Abnormal chest CT    Anemia    Aortic atherosclerosis (HCC)    Asthma    Atrial enlargement, left    severe   Atrial fibrillation ([place]) 01/05/2011   Chronic persistent, failed DCCV    Atrial flutter ([place])    Bronchospasm 1998   Cardiomegaly    Carotid bruit    Chronic diastolic congestive heart failure ([place])    Diabetes mellitus    Type II   Ejection fraction < 50%    35-40%,    Heart murmur    History of blood transfusion    History of radiation therapy 03/18/19-04/15/19   endometrial - vaginal brachytherapy -  Dr. Sondra Come    Hypercholesterolemia    Hypertension    Junctional rhythm 09/14/2018   Noted on EKG   Left bundle branch block (LBBB) 09/14/2018   Noted on EKG   LVH (left ventricular hypertrophy) 10/10/2016   Moderate, Noted on ECHO   Mitral regurgitation    Obesity    Obesity (BMI 30-39.9) 01/16/2012   Persistent atrial fibrillation ([place])    Polyp of rectum    Rheumatic fever 01/16/2012   Reported during childhood   S/P Maze operation for atrial fibrillation 02/15/2012   Complete biatrial lesion set using bipolar radiofrequency and cryothermy ablation with clipping of LA appendage   S/P mitral valve replacement with metallic valve [id number]   17m Sorin [employer] Optiform mechanical prosthesis   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   220mEdwards mc3 ring annuloplasty   Shortness of breath    with exertion   Sleep apnea    DOES NOT HAVE CPAP   Stroke ([id number]   Post op , left arm weakness   Tricuspid regurgitation 01/16/2012   [facility]   Past Surgical History:  Procedure Laterality Date   CARDIAC CATHETERIZATION  >5 years   CARDIOVASCULAR  STRESS TEST  09/2010   CARDIOVERSION  03/25/2011   Procedure: CARDIOVERSION;  Surgeon: JaJettie BoozeMD;  [place] [facility] Service: Cardiovascular;  Laterality: N/A;   CARDIOVERSION N/A 07/27/2012   Procedure: CARDIOVERSION;  Surgeon: JaJettie BoozeMD;  Location: [facility] Service: Cardiovascular;  Laterality: N/A;   CESAREAN SECTION     COLONOSCOPY WITH PROPOFOL N/A 04/27/2016   Procedure: COLONOSCOPY WITH PROPOFOL;  Surgeon: ViWilford CornerMD;  [facility];  Service: Endoscopy;  Laterality: N/A;   CRANIOTOMY Left 12/03/2021   Procedure: FRONTOTEMPORAL CRANIOTOMY FOR EVACUATION SUBDURAL HEMATOMA;  Surgeon: DaKarsten RoDO;  [place] Service: Neurosurgery;  Laterality: Left;   INTRAOPERATIVE TRANSESOPHAGEAL ECHOCARDIOGRAM  02/15/2012   Procedure: INTRAOPERATIVE TRANSESOPHAGEAL ECHOCARDIOGRAM;  Surgeon: ClRexene AlbertsMD;  [place] [facility] Service: Open Heart Surgery;  Laterality: N/A;   MAZE  02/15/2012   Procedure: MAZE;  Surgeon: ClRexene AlbertsMD;  [facility] Service: Open Heart Surgery;  Laterality: N/A;   MITRAL VALVE REPLACEMENT  02/15/2012   Procedure: MITRAL VALVE (MV) REPLACEMENT;  Surgeon: ClRexene AlbertsMD;  [place] [facility] Service: Open Heart Surgery;  Laterality: N/A;   ROBOTIC ASSISTED LAPAROSCOPIC HYSTERECTOMY AND SALPINGECTOMY Bilateral 01/02/2019   Procedure: XI ROBOTIC ASSISTED LAPAROSCOPIC TOTAL HYSTERECTOMY WITH BILATERAL  SALPINGOOPHORECTOMY, SENTINEL LYMPH NODE BIOPSY;  Surgeon: [name], MD;  Location[facility];  Service: Gynecology;  Laterality: Bilateral;   TEE WITHOUT CARDIOVERSION  12/21/2011   Procedure: TRANSESOPHAGEAL ECHOCARDIOGRAM (TEE);  Surgeon: Jettie Booze, MD;  Location: [place];  Service: Cardiovascular;  Laterality: N/A;   TOOTH EXTRACTION N/A 11/19/2021   Procedure: DENTAL EXTRACTION TEETH NUMBER 14, 19, 30, 31;  Surgeon: Diona Browner, DMD;  Location: [place];  Service: Oral  Surgery;  Laterality: N/A;   TRICUSPID VALVE REPLACEMENT  02/15/2012   Procedure: TRICUSPID VALVE REPAIR;  Surgeon: Rexene Alberts, MD;  Location: [place];  Service: Open Heart Surgery;  Laterality: N/A;   TUBAL LIGATION     at time of her c-section   Patient Active Problem List   Diagnosis Date Noted   Hypertension associated with diabetes ([place]) 12/08/2021   Anticoagulated on warfarin 12/07/2021   Acute subdural hematoma ([place]) 12/02/2021   Chronic anticoagulation - on coumadin for mechanical mitral valve, afib 12/02/2021   Subdural hematoma, nontraumatic ([place]) 12/02/2021   Pancytopenia, acquired ([place]) 06/21/2019   Peripheral neuropathy due to chemotherapy ([place]) 06/21/2019   Anemia, chronic disease 05/21/2019   Drug-induced hyperglycemia 02/25/2019   Stage 3a chronic kidney disease (CKD) ([place]) 02/04/2019   Iron deficiency anemia 01/18/2019   Endometrial cancer ([place]) 01/02/2019   Uterine cancer ([place]) 12/27/2018   Special screening for malignant neoplasms, colon 04/27/2016   LBBB (left bundle branch block) 10/07/2013   Encounter for therapeutic drug monitoring 03/19/2013   Mitral valve disorders(424.0) 10/12/2012   Heart valve replaced by other means 10/12/2012   Atrial flutter ([place])    CVA (cerebral infarction) 04/14/2012   S/P TVR (tricuspid valve repair) 03/19/2012   S/P MVR (mitral valve replacement) 03/19/2012   History of mitral valve replacement with mechanical valve 02/15/2012   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   S/P Maze operation for atrial fibrillation 02/15/2012   CHF (congestive heart failure) ([place]) 02/06/2012   MR (mitral regurgitation) 02/06/2012   Tricuspid regurgitation 01/16/2012   Rheumatic fever 01/16/2012   Obesity (BMI 30-39.9) 01/16/2012   Mitral regurgitation    Chronic diastolic CHF (congestive heart failure) ([place]) 12/19/2011   Snoring 02/28/2011   Atrial fibrillation ([place]) [id number]   Chronic systolic dysfunction of left ventricle 01/05/2011    Hypertension 10/18/2010   Type 2 diabetes mellitus ([place]) 10/18/2010   Hypercholesterolemia 10/18/2010   Mediastinal lymphadenopathy 10/18/2010   Asthma 10/18/2010    ONSET DATE: Pt had teeth extracted on 01/19/2021 with continued bleeding; she presented the ED on 12/01/2021; unknown onset date of SDH; admitted to IRF 12/10/2021; discharged 12/16/2021  REFERRING DIAG: S06.5XAA (ICD-10-CM) - SDH (subdural hematoma)   THERAPY DIAG:  No diagnosis found.  Rationale for Evaluation and Treatment: Rehabilitation  SUBJECTIVE:   SUBJECTIVE STATEMENT: Exercises are going well.   Pt accompanied by: self  PERTINENT HISTORY:   "ERIANNY WAKEHAM is a 67 y.o. female with history of CHF, OSA, CKD, MVR-chronic Coumadin, left MCA stroke 2014, recent dental extraction who was evaluated in the ED 12/02/21 due to issues with ongoing bleeding since her procedure.  She will did report headaches which was followed by somnolence with confusion and CT of head done showing acute on chronic SDH with 16 mm shift to the left, 8 mm left to right midline shift and effacement of left greater than right psych eye.  INR at admission was 2.4 and was reversed with Kcentra.  Dr. Reatha Armour was consulted and she underwent left  frontotemporal craniotomy for evacuation of hematoma on 11/24.  She was started on Keppra for seizure prophylaxis and cleared to start IV heparin on 11/27.  Recommendations were to repeat CT once heparin was therapeutic prior to transition to oral AC.     On 11/29 she had significant worsening of aphasia, problems with ADL and decreased ability to follow commands with more of a left bias with left gaze preference.  EEG was done showing area of epileptogenicity arising from the left frontal region with cortical dysfunction left hemisphere.  Repeat CT head showed mixed density left frontal parietal and temporal lobe which was unchanged and new small right temporal SDH which was unchanged.  She was cleared to resume  Coumadin.  She had improvement in motor planning deficits with no overt left neglect or bias per recent therapy notes.  She continued to be intact bilaterally, motor planning deficits, minimal verbalization with decreasing carryover.  CIR was recommended due to functional decline."  PRECAUTIONS: Fall and Other: No driving for at least 6 months or  till cleared by neurology.   WEIGHT BEARING RESTRICTIONS: No  PAIN:  Are you having pain? No  FALLS: Has patient fallen in last 6 months? No  LIVING ENVIRONMENT: Lives with: lives with their spouse and lives with their son Lives in: House/apartment Stairs: Yes: External: 1 steps; none Has following equipment at home: Quad cane large base, shower chair, and bed side commode  PLOF: Independent; driving; picked up grandkids  PATIENT GOALS: return to PLOF  OBJECTIVE: (from evaluation unless otherwise noted)  HAND DOMINANCE: Right  ADLs: Overall ADLs: mod I  IADLs: Requires min A with most due to mobility and cognition  MOBILITY STATUS: Independent  ACTIVITY TOLERANCE: Activity tolerance: good  FUNCTIONAL OUTCOME MEASURES: FOTO: Severity: Slight (Intake FS: 88) ; expected score at d/c: 95  UPPER EXTREMITY ROM:     AROM Right (eval) Left (eval)  Shoulder flexion WNL WNL  Shoulder abduction WNL WNL  Elbow flexion WNL WNL  Elbow extension WNL WNL  Wrist flexion WNL WNL  Wrist extension WNL WNL  Wrist pronation WNL WNL  Wrist supination WNL WNL   Digit Composite Flexion WNL WNL  Digit Composite Extension WNL WNL  Digit Opposition WNL Dyskinesia noted  (Blank rows = not tested)  UPPER EXTREMITY MMT:     MMT Right (eval) Left (eval)  Shoulder flexion WNL WNL  Shoulder abduction WNL WNL  Elbow flexion WNL WNL  Elbow extension WNL WNL  (Blank rows = not tested)  HAND FUNCTION: Grip strength: Right: 66.3 lbs; Left: 46.7 lbs 02/15/2022 - 49.6 lbs   COORDINATION: 9 Hole Peg test: Right: 31 sec; Left: 40  sec  SENSATION: WFL  EDEMA: none  MUSCLE TONE: RUE: Within functional limits and LUE: Within functional limits  COGNITION: Overall cognitive status: Impaired; see ST eval  VISION: Subjective report: no changes Baseline vision: Wears glasses for reading only PRN; chose not to drive at night Visual history:  n/a  VISION ASSESSMENT: WFL  PERCEPTION: WFL  PRAXIS: WFL  OBSERVATIONS: Pt ambulates with quadcane. No LOB noted. Pt appears well kept and is able to answer most questions appropriately with increased time. Pt able to write full name without error, but when asked to write the day of the week, "Thursday", she wrote "Thurday".    TODAY'S TREATMENT:                                                                                                                              -  Therapeutic activities completed for duration as noted below including:  With use of L, pt placed and then removed colored, small pegs into corresponding hole with use of pattern sheets for ROM, coordination, and strength of affected extremity.  Wooden dot puzzle with L for cognition and coordination x 2.   Pt assembled 7 Pieces of [place] for LUE coordination and visuospatial relations. X2  PATIENT EDUCATION: Education details: HEP Person educated: Patient Education method: Education officer, environmental, Verbal cues, and Handouts Education comprehension: verbalized understanding, returned demonstration, and needs further education  HOME EXERCISE PROGRAM: 12/28 - LUE red putty hep 2/1 - L coordination HEP   GOALS:  SHORT TERM GOALS: Target date: 02/03/2022   Patient will be independent with initial LUE HEP. Baseline: Goal status: MET  2.  Patient will demonstrate at least 50 lbs L grip strength as needed to open jars and other containers. Baseline: 46.7 lbs 2/6: 49.6 lbs Goal status: IN PROGRESS   LONG TERM GOALS: Target date: 03/04/2022    Patient will demonstrate updated LUE HEP  with 25% verbal cues or less for proper execution. Baseline:  Goal status: INITIAL  2.  Patient will demonstrate at least 56.7 lbs L grip strength as needed to open jars and other containers. Baseline: 46.7 lbs Goal status:  IN PROGRESS  3.  Patient will complete nine-hole peg with use of L in 32 seconds or less. Baseline: 40 seconds Goal status: INITIAL  4.  Pt will complete FOTO assessment at time of discharge scoring 95 or greater indicating functional progression with ADL and IADL completion.. Baseline: 88 Goal status: INITIAL  5.  Pt will report completing typical IADLs mod I with good safety as evidenced in therapy clinic.  Baseline: requiring at least min A with supervision due to cognition Goal status: INITIAL  ASSESSMENT:  CLINICAL IMPRESSION: Pt would benefit from skilled OT services in the outpatient setting to work on impairments as noted below to help pt return to PLOF as able.    PERFORMANCE DEFICITS: in functional skills including ADLs, IADLs, coordination, strength, and UE functional use, cognitive skills including memory, problem solving, safety awareness, sequencing, and understand.  IMPAIRMENTS: are limiting patient from ADLs, IADLs, leisure, and social participation.   CO-MORBIDITIES: may have co-morbidities  that affects occupational performance. Patient will benefit from skilled OT to address above impairments and improve overall function.  REHAB POTENTIAL: Good  PLAN:  OT FREQUENCY: 1x/week  OT DURATION: 8 weeks  PLANNED INTERVENTIONS: self care/ADL training, therapeutic exercise, therapeutic activity, neuromuscular re-education, functional mobility training, moist heat, patient/family education, cognitive remediation/compensation, visual/perceptual remediation/compensation, energy conservation, DME and/or AE instructions, and Re-evaluation  RECOMMENDED OTHER SERVICES: none at this time  CONSULTED AND AGREED WITH PLAN OF CARE: Patient  PLAN FOR NEXT  SESSION: functional/cognitive tasks; higher level coordination; L hand strengthening START with small peg puzzle 6 different colored triangles   Dennis Bast, OT 02/22/2022, 10:15 AM

## 2022-02-22 NOTE — Therapy (Signed)
OUTPATIENT PHYSICAL THERAPY NEURO TREATMENT / DISCHARGE   Patient Name: [name] MRN: [id number] DOB:July 11, 1955, 67 y.o., female Today's Date: 02/22/2022   PCP: Lennie Odor, PA REFERRING PROVIDER: Cathlyn Parsons, PA-C  END OF SESSION:  PT End of Session - 02/22/22 1104     Visit Number 7    Number of Visits 7    Date for PT Re-Evaluation 03/03/22    Authorization Type [employer]    PT Start Time 1103    PT Stop Time 1130    PT Time Calculation (min) 27 min    Equipment Utilized During Treatment Gait belt    Activity Tolerance Patient tolerated treatment well    Behavior During Therapy WFL for tasks assessed/performed             Past Medical History:  Diagnosis Date   Abnormal chest CT    Anemia    Aortic atherosclerosis (HCC)    Asthma    Atrial enlargement, left    severe   Atrial fibrillation ([place]) 01/05/2011   Chronic persistent, failed DCCV    Atrial flutter ([place])    Bronchospasm 1998   Cardiomegaly    Carotid bruit    Chronic diastolic congestive heart failure ([place])    Diabetes mellitus    Type II   Ejection fraction < 50%    35-40%,    Heart murmur    History of blood transfusion    History of radiation therapy 03/18/19-04/15/19   endometrial - vaginal brachytherapy -  Dr. Sondra Come    Hypercholesterolemia    Hypertension    Junctional rhythm 09/14/2018   Noted on EKG   Left bundle branch block (LBBB) 09/14/2018   Noted on EKG   LVH (left ventricular hypertrophy) 10/10/2016   Moderate, Noted on ECHO   Mitral regurgitation    Obesity    Obesity (BMI 30-39.9) 01/16/2012   Persistent atrial fibrillation ([place])    Polyp of rectum    Rheumatic fever 01/16/2012   Reported during childhood   S/P Maze operation for atrial fibrillation 02/15/2012   Complete biatrial lesion set using bipolar radiofrequency and cryothermy ablation with clipping of LA appendage   S/P mitral valve replacement with metallic valve [id number]   9m Sorin  [employer]bomedics Optiform mechanical prosthesis   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   268mEdwards mc3 ring annuloplasty   Shortness of breath    with exertion   Sleep apnea    DOES NOT HAVE CPAP   [place]   Post op , left arm weakness   Tricuspid regurgitation 01/16/2012   [facility]   Past Surgical History:  Procedure Laterality Date   CARDIAC CATHETERIZATION  >5 years   CARDIOVASCULAR STRESS TEST  09/2010   CARDIOVERSION  03/25/2011   Procedure: CARDIOVERSION;  Surgeon: JaJettie BoozeMD;  [facility] Service: Cardiovascular;  Laterality: N/A;   CARDIOVERSION N/A 07/27/2012   Procedure: CARDIOVERSION;  Surgeon: JaJettie BoozeMD;  [facility] Service: Cardiovascular;  Laterality: N/A;   CESAREAN SECTION     COLONOSCOPY WITH PROPOFOL N/A 04/27/2016   Procedure: COLONOSCOPY WITH PROPOFOL;  Surgeon: ViWilford CornerMD;  [facility]  Service: Endoscopy;  Laterality: N/A;   CRANIOTOMY Left 12/03/2021   Procedure: FRONTOTEMPORAL CRANIOTOMY FOR EVACUATION SUBDURAL HEMATOMA;  Surgeon: DaKarsten RoDO;  [facility] Service: Neurosurgery;  Laterality: Left;   INTRAOPERATIVE TRANSESOPHAGEAL ECHOCARDIOGRAM  02/15/2012   Procedure: INTRAOPERATIVE TRANSESOPHAGEAL  ECHOCARDIOGRAM;  Surgeon: Rexene Alberts, MD;  Location: [place];  Service: Open Heart Surgery;  Laterality: N/A;   MAZE  02/15/2012   Procedure: MAZE;  Surgeon: Rexene Alberts, MD;  Location: [place];  Service: Open Heart Surgery;  Laterality: N/A;   MITRAL VALVE REPLACEMENT  02/15/2012   Procedure: MITRAL VALVE (MV) REPLACEMENT;  Surgeon: Rexene Alberts, MD;  Location: [place];  Service: Open Heart Surgery;  Laterality: N/A;   ROBOTIC ASSISTED LAPAROSCOPIC HYSTERECTOMY AND SALPINGECTOMY Bilateral 01/02/2019   Procedure: XI ROBOTIC ASSISTED LAPAROSCOPIC TOTAL HYSTERECTOMY WITH BILATERAL SALPINGOOPHORECTOMY, SENTINEL LYMPH NODE BIOPSY;  Surgeon: [name], MD;   [facility];  Service: Gynecology;  Laterality: Bilateral;   TEE WITHOUT CARDIOVERSION  12/21/2011   Procedure: TRANSESOPHAGEAL ECHOCARDIOGRAM (TEE);  Surgeon: Jettie Booze, MD;  Location: [place];  Service: Cardiovascular;  Laterality: N/A;   TOOTH EXTRACTION N/A 11/19/2021   Procedure: DENTAL EXTRACTION TEETH NUMBER 14, 19, 30, 31;  Surgeon: Diona Browner, DMD;  Location: [place];  Service: Oral Surgery;  Laterality: N/A;   TRICUSPID VALVE REPLACEMENT  02/15/2012   Procedure: TRICUSPID VALVE REPAIR;  Surgeon: Rexene Alberts, MD;  Location: [place];  Service: Open Heart Surgery;  Laterality: N/A;   TUBAL LIGATION     at time of her c-section   Patient Active Problem List   Diagnosis Date Noted   Hypertension associated with diabetes ([place]) 12/08/2021   Anticoagulated on warfarin 12/07/2021   Acute subdural hematoma ([place]) 12/02/2021   Chronic anticoagulation - on coumadin for mechanical mitral valve, afib 12/02/2021   Subdural hematoma, nontraumatic ([place]) 12/02/2021   Pancytopenia, acquired ([place]) 06/21/2019   Peripheral neuropathy due to chemotherapy ([place]) 06/21/2019   Anemia, chronic disease 05/21/2019   Drug-induced hyperglycemia 02/25/2019   Stage 3a chronic kidney disease (CKD) ([place]) 02/04/2019   Iron deficiency anemia 01/18/2019   Endometrial cancer ([place]) 01/02/2019   Uterine cancer ([place]) 12/27/2018   Special screening for malignant neoplasms, colon 04/27/2016   LBBB (left bundle branch block) 10/07/2013   Encounter for therapeutic drug monitoring 03/19/2013   Mitral valve disorders(424.0) 10/12/2012   Heart valve replaced by other means 10/12/2012   Atrial flutter ([place])    CVA (cerebral infarction) 04/14/2012   S/P TVR (tricuspid valve repair) 03/19/2012   S/P MVR (mitral valve replacement) 03/19/2012   History of mitral valve replacement with mechanical valve 02/15/2012   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   S/P Maze operation for atrial fibrillation  02/15/2012   CHF (congestive heart failure) ([place]) 02/06/2012   MR (mitral regurgitation) 02/06/2012   Tricuspid regurgitation 01/16/2012   Rheumatic fever 01/16/2012   Obesity (BMI 30-39.9) 01/16/2012   Mitral regurgitation    Chronic diastolic CHF (congestive heart failure) ([place]) 12/19/2011   Snoring 02/28/2011   Atrial fibrillation ([place]) [id number]   Chronic systolic dysfunction of left ventricle 01/05/2011   Hypertension 10/18/2010   Type 2 diabetes mellitus ([place]) 10/18/2010   Hypercholesterolemia 10/18/2010   Mediastinal lymphadenopathy 10/18/2010   Asthma 10/18/2010    ONSET DATE: 12/15/2021 (referral date)  REFERRING DIAG: S06.5XAA (ICD-10-CM) - SDH (subdural hematoma) (HCC)  THERAPY DIAG:  Muscle weakness (generalized)  Unsteadiness on feet  Abnormality of gait and mobility  Rationale for Evaluation and Treatment: Rehabilitation  SUBJECTIVE:  SUBJECTIVE STATEMENT:  Patient arrives to clinic and reports that is doing good. Denies falls/near falls. Feels confident in HEP.   Pt accompanied by: self  PERTINENT HISTORY:   Per Note on 12/10/2021:  "Brief HPI:   YLENIA SPACKMAN is a 67 y.o. female with history of CHF, OSA, CKD, MVR-chronic Coumadin, left MCA stroke 2014, recent dental extraction who was evaluated in the ED 12/02/21 due to issues with ongoing bleeding since her procedure.  She will did report headaches which was followed by somnolence with confusion and CT of head done showing acute on chronic SDH with 16 mm shift to the left, 8 mm left to right midline shift and effacement of left greater than right psych eye.  INR at admission was 2.4 and was reversed with Kcentra.  Dr. Reatha Armour was consulted and she underwent left frontotemporal craniotomy for evacuation of hematoma on 11/24.   She was started on Keppra for seizure prophylaxis and cleared to start IV heparin on 11/27.  Recommendations were to repeat CT once heparin was therapeutic prior to transition to oral [facility]."  Patient transferred to [place] on 12/10/2021 per notes and saw all three disciplines. Finished skilled physical therapy in that setting on 12/15/2021.  PAIN:  Are you having pain? No  PRECAUTIONS: Fall and ICD/Pacemaker  WEIGHT BEARING RESTRICTIONS: No  FALLS: Has patient fallen in last 6 months?  Patient reports no falls in last 6 months  PATIENT GOALS: "I want my speech to be better. My family want me to work on my safety."  OBJECTIVE:   DIAGNOSTIC FINDINGS:  12/08/2021 CT Head w/o Contrast: "IMPRESSION: 1. Mixed density subdural hematoma on the left is unchanged. Mass-effect with 4 mm midline shift to the right unchanged. 2. Small right temporal subdural hematoma unchanged. 3. No new area of hemorrhage."  TODAY'S TREATMENT:                                                                                                                               Assessment of Goals/Outcomes as noted below: FOTO: 74.3 ABC:  66.25% 2MWT: 463 feet or 141.1 meters; RPE: 10/10 + SBA 10MWT: 1.19 m/s without AD + SBA  PATIENT EDUCATION: Education details: Continue HEP, return to therapy if notice decline Person educated: Patient Education method: Explanation Education comprehension: verbalized understanding and needs further education  HOME EXERCISE PROGRAM: Access Code: [email or web address] URL: [email or web address] Date: 01/13/2022 Prepared by: Malachi Carl  Exercises - Sit to Stand Without Arm Support  - 1 x daily - 7 x weekly - 3 sets - 5 reps - Corner Balance Feet Together: Eyes Closed With Head Turns  - 1 x daily - 7 x weekly - 3 sets - 10 reps - Standing 3-Way Kick  - 1 x daily - 7 x weekly - 3 sets - 10 reps - Standing Marching  - 1 x daily - 7 x weekly - 3 sets - 10  reps  KB Home	[place] 6 Week Beginner Walking Program - pdf provided (increase gait speed)  GOALS: Goals reviewed with patient? Yes  LONG TERM GOALS: Target date: 03/03/2022 (LTG only given length of POC)  Patient will report demonstrate independence with final HEP in order to maintain current gains and continue to progress after physical therapy discharge.   Baseline: To be provided; Reports full confidence in HEP 02/22/2022 Goal status: MET  2.  Patient will improve gait speed to 1.1 m/s or greater with LRAD to indicate a reduced risk for falls.   Baseline: 0.81 m/s; 1.19 m/s SBA  Goal status: MET  3.  Patient will improve their 5x Sit to Stand score to less than 15 seconds to demonstrate a decreased risk for falls and improved LE strength.   Baseline: 21.76" with decreased eccentric control to sit and mild LOB on final sit to stand and able to recover independently; improved to 16.86" w/o UE and no loss of balance on 02/01/2022; 12.28" on 02/10/2022 w/o UE use Goal status: MET  4.  Patient will improve ABC Score to 67% or greater to indicate a decreased risk for falls and improved self-reported confidence in balance and sense of steadiness.   Baseline: 61.3%; improved to 66.25% Goal status: ADEQUATE PROGRESS  5.  Patient will improve 2MWT with LRAD to 155.2 m or greater to achieve age reported norms and demonstrate improved cardiorespiratory endurance and mobility. Baseline: 89.48m 141.1 meters; RPE: 10/10 Goal status: ADEQUATE PROGRESS  ASSESSMENT:  CLINICAL IMPRESSION: Patient is discharging from skilled physical therapy today given achievement of maximal rehab potential at this time. Patient achieved 3/5 goals and demonstrated adequate progress towards remaining to goals. Patient scored just below falls risk cutoff for ABC indicating still slight decreased risk for falls for higher level balance task; however, patient demonstrates appropriate safety awareness for these activities.  Patient ambulation speed indicated unlimited community ambulation is no longer at an increased risk for falls. Patient demonstrates confidence in HEP and though still ambulating slightly below age-matched norms during 2MWT, demonstrates significant improvement from initial testing. Patient is discharging from physical therapy given significant gains and demonstrating ability to maintain function at home.  OBJECTIVE IMPAIRMENTS: Abnormal gait, decreased balance, decreased knowledge of use of DME, decreased mobility, difficulty walking, decreased strength, decreased safety awareness, impaired sensation, and impaired tone.   ACTIVITY LIMITATIONS: standing, squatting, and caring for others  PARTICIPATION LIMITATIONS: driving and community activity  PERSONAL FACTORS: Age, Past/current experiences, and 1-2 comorbidities: see above  are also affecting patient's functional outcome.   REHAB POTENTIAL: Good  CLINICAL DECISION MAKING: Evolving/moderate complexity  EVALUATION COMPLEXITY: Moderate  PLAN:  PT FREQUENCY: 1x/week  PT DURATION: 8 weeks  PLANNED INTERVENTIONS: Therapeutic exercises, Therapeutic activity, Neuromuscular re-education, Gait training, Self Care, and Manual therapy  PLAN FOR NEXT SESSION: NA - Discharged with HEP  SMalachi Carl PT, DPT  02/22/2022, 11:52 AM  PHYSICAL THERAPY DISCHARGE SUMMARY  Visits from Start of Care: 7  Current functional level related to goals / functional outcomes: See above; achieved 3/5 LTG and adequate progress towards remaining   Remaining deficits: Mild cardiorespiratory endurance impairments/slight falls risk but demonstrates appropriate safety awareness/activity modification   Education / Equipment: Updated HEP, education on when to return to PT with new referral if notices decline in function   Patient agrees to discharge. Patient goals were partially met. Patient is being discharged due to maximized rehab potential. Patient reports  satisfaction with care/progress.

## 2022-02-22 NOTE — Therapy (Signed)
OUTPATIENT SPEECH LANGUAGE PATHOLOGY TREATMENT   Patient Name: [name] MRN: [id number] DOB:[birth year], 67 y.o., female 29 Date: 02/22/2022  PCP: Lennie Odor, [place] REFERRING PROVIDER: Cathlyn Parsons., PA-C  END OF SESSION:  End of Session - 02/22/22 0916     Visit Number 7    Number of Visits 25    Date for SLP Re-Evaluation 03/31/22    Authorization Type [employer]    Progress Note Due on Visit 10    SLP Start Time 0930    SLP Stop Time  [id number]    SLP Time Calculation (min) 45 min    Activity Tolerance Patient tolerated treatment well                Past Medical History:  Diagnosis Date   Abnormal chest CT    Anemia    Aortic atherosclerosis (HCC)    Asthma    Atrial enlargement, left    severe   Atrial fibrillation ([place]) 01/05/2011   Chronic persistent, failed DCCV    Atrial flutter (HCC)    Bronchospasm 1998   Cardiomegaly    Carotid bruit    Chronic diastolic congestive heart failure ([place])    Diabetes mellitus    Type II   Ejection fraction < 50%    35-40%,    Heart murmur    History of blood transfusion    History of radiation therapy 03/18/19-04/15/19   endometrial - vaginal brachytherapy -  Dr. Sondra Come    Hypercholesterolemia    Hypertension    Junctional rhythm 09/14/2018   Noted on EKG   Left bundle branch block (LBBB) 09/14/2018   Noted on EKG   LVH (left ventricular hypertrophy) 10/10/2016   Moderate, Noted on ECHO   Mitral regurgitation    Obesity    Obesity (BMI 30-39.9) 01/16/2012   Persistent atrial fibrillation ([place])    Polyp of rectum    Rheumatic fever 01/16/2012   Reported during childhood   S/P Maze operation for atrial fibrillation 02/15/2012   Complete biatrial lesion set using bipolar radiofrequency and cryothermy ablation with clipping of LA appendage   S/P mitral valve replacement with metallic valve [id number]   53m Sorin [employer] Optiform mechanical prosthesis   S/P tricuspid valve repair  02/15/2012   219mEdwards mc3 ring annuloplasty   Shortness of breath    with exertion   Sleep apnea    DOES NOT HAVE CPAP   [place]   Post op , left arm weakness   Tricuspid regurgitation 01/16/2012   [facility]ine cancer [facility]   Past Surgical History:  Procedure Laterality Date   CARDIAC CATHETERIZATION  >5 years   CARDIOVASCULAR STRESS TEST  09/2010   CARDIOVERSION  03/25/2011   Procedure: CARDIOVERSION;  Surgeon: JaJettie BoozeMD;  [facility] Service: Cardiovascular;  Laterality: N/A;   CARDIOVERSION N/A 07/27/2012   Procedure: CARDIOVERSION;  Surgeon: JaJettie BoozeMD;  [facility] Service: Cardiovascular;  Laterality: N/A;   CESAREAN SECTION     COLONOSCOPY WITH PROPOFOL N/A 04/27/2016   Procedure: COLONOSCOPY WITH PROPOFOL;  Surgeon: ViWilford CornerMD;  [facility];  Service: Endoscopy;  Laterality: N/A;   CRANIOTOMY Left 12/03/2021   Procedure: FRONTOTEMPORAL CRANIOTOMY FOR EVACUATION SUBDURAL HEMATOMA;  Surgeon: DaKarsten RoDO;  [facility] Service: Neurosurgery;  Laterality: Left;   INTRAOPERATIVE TRANSESOPHAGEAL ECHOCARDIOGRAM  02/15/2012   Procedure: INTRAOPERATIVE TRANSESOPHAGEAL ECHOCARDIOGRAM;  Surgeon: ClRexene AlbertsMD;  [facility];  Service: Open Heart Surgery;  Laterality: N/A;   MAZE  02/15/2012   Procedure: MAZE;  Surgeon: Rexene Alberts, MD;  Location: [place];  Service: Open Heart Surgery;  Laterality: N/A;   MITRAL VALVE REPLACEMENT  02/15/2012   Procedure: MITRAL VALVE (MV) REPLACEMENT;  Surgeon: Rexene Alberts, MD;  Location: [place];  Service: Open Heart Surgery;  Laterality: N/A;   ROBOTIC ASSISTED LAPAROSCOPIC HYSTERECTOMY AND SALPINGECTOMY Bilateral 01/02/2019   Procedure: XI ROBOTIC ASSISTED LAPAROSCOPIC TOTAL HYSTERECTOMY WITH BILATERAL SALPINGOOPHORECTOMY, SENTINEL LYMPH NODE BIOPSY;  Surgeon: [name], MD;  Location[facility];  Service: Gynecology;  Laterality: Bilateral;   TEE  WITHOUT CARDIOVERSION  12/21/2011   Procedure: TRANSESOPHAGEAL ECHOCARDIOGRAM (TEE);  Surgeon: Jettie Booze, MD;  Location: [place];  Service: Cardiovascular;  Laterality: N/A;   TOOTH EXTRACTION N/A 11/19/2021   Procedure: DENTAL EXTRACTION TEETH NUMBER 14, 19, 30, 31;  Surgeon: Diona Browner, DMD;  Location: [place];  Service: Oral Surgery;  Laterality: N/A;   TRICUSPID VALVE REPLACEMENT  02/15/2012   Procedure: TRICUSPID VALVE REPAIR;  Surgeon: Rexene Alberts, MD;  Location: [place];  Service: Open Heart Surgery;  Laterality: N/A;   TUBAL LIGATION     at time of her c-section   Patient Active Problem List   Diagnosis Date Noted   Hypertension associated with diabetes ([place]) 12/08/2021   Anticoagulated on warfarin 12/07/2021   Acute subdural hematoma ([place]) 12/02/2021   Chronic anticoagulation - on coumadin for mechanical mitral valve, afib 12/02/2021   Subdural hematoma, nontraumatic ([place]) 12/02/2021   Pancytopenia, acquired ([place]) 06/21/2019   Peripheral neuropathy due to chemotherapy ([place]) 06/21/2019   Anemia, chronic disease 05/21/2019   Drug-induced hyperglycemia 02/25/2019   Stage 3a chronic kidney disease (CKD) ([place]) 02/04/2019   Iron deficiency anemia 01/18/2019   Endometrial cancer ([place]) 01/02/2019   Uterine cancer ([place]) 12/27/2018   Special screening for malignant neoplasms, colon 04/27/2016   LBBB (left bundle branch block) 10/07/2013   Encounter for therapeutic drug monitoring 03/19/2013   Mitral valve disorders(424.0) 10/12/2012   Heart valve replaced by other means 10/12/2012   Atrial flutter ([place])    CVA (cerebral infarction) 04/14/2012   S/P TVR (tricuspid valve repair) 03/19/2012   S/P MVR (mitral valve replacement) 03/19/2012   History of mitral valve replacement with mechanical valve 02/15/2012   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   S/P Maze operation for atrial fibrillation 02/15/2012   CHF (congestive heart failure) ([place]) 02/06/2012   MR (mitral  regurgitation) 02/06/2012   Tricuspid regurgitation 01/16/2012   Rheumatic fever 01/16/2012   Obesity (BMI 30-39.9) 01/16/2012   Mitral regurgitation    Chronic diastolic CHF (congestive heart failure) ([place]) 12/19/2011   Snoring 02/28/2011   Atrial fibrillation ([place]) [id number]   Chronic systolic dysfunction of left ventricle 01/05/2011   Hypertension 10/18/2010   Type 2 diabetes mellitus ([place]) 10/18/2010   Hypercholesterolemia 10/18/2010   Mediastinal lymphadenopathy 10/18/2010   Asthma 10/18/2010    ONSET DATE: 12-02-2021   REFERRING DIAG: [id number] (ICD-10-CM) - SDH (subdural hematoma) (HCC)   THERAPY DIAG:  Aphasia  Cognitive communication deficit  Rationale for Evaluation and Treatment: Rehabilitation  SUBJECTIVE:   SUBJECTIVE STATEMENT: "It's not going" [id number]   PERTINENT HISTORY: L-[facility] 2014, dental procedure 11-19-2021 resulting in ongoing bleeding. Presented to ED 12-02-2021, imaging revealed acute on chronic SDH. While admitted began showing speech disturbances. Underwent L frontotemporal craniotomy for evacuation of hematoma 12-03-2021. Received ST services while admitted to inpatient rehab.  PAIN:  Are you having pain? No  FALLS: Has patient fallen in last 6 months?  No  PATIENT GOALS: "do how I did before"  OBJECTIVE:   TODAY'S TREATMENT:   02-22-22: Target naming and anomia compensation of description through [employer]. SLP provided education on use of SFA as rehabilitation tool to improve lexical representation with improved word finding, use as structure to aid in description in conversation, and as self-cueing strategy. Modeled how SFA is completed with simple object "sidewalk" as pt reported difficulty with this word since last ST visit. Given visual target, pt able to name 4/4 on initial attempt. Pt able to generate average of 3/6 salient features given frequent mod verbal/questioning cues. Pt reporting difficulty with numbers  (e.g. telling correct time), word finding in conversation (including dates: months, days of week). SLP questions whether pt deisres to continue working on reading/writing, with pt reporting primary concern at this time is "getting it out" (re: spoken expression).   02-15-22: Pt reported doing her HEP (divergent naming task). Clinician provided resources to facilitate further HEP and encourage pt o engage in activities that support anomia strategies. Pt endorsed that she enjoys word searches and has recently started doing them again. Provided occasional verbal prompts, pt participated in generative naming activity in which she produced 10/10 relevant words in target category (clothes). SLP then demonstrated how pt can utilize word searches as a tool to practice generative naming as HEP. Pt agreeable to HEP.   02-10-22: Pt reported that she has been doing her HEP with counting. She did not use her external aid; she worked through the challenge. She stated she has not been practicing her reading and writing as much. SLP provided re-education of importance and benefit of practice at home. SLP led pt in divergent naming task where she produced 20/26 target words, benefiting from occasional semantic cues. SLP demonstrated modified semantic feature analysis utilizing gestural, phonemic, and semantic cues to aid pt in producing target word. Provided education on use of description as anomia strategy with goal to keep conversation moving by self-cueing or getting A from communication partner. Updated HEP: Categorical naming activity   02-01-22: Given phoneme, pt able to relay accurate letter and sound in 100% of trials. Reads simples words in 5/5 trials. Pt requests to work on counting, is finding self repeating numbers impacting successful counting. SLP educates on use of external aids and demonstrates use of to aid in accurate counting up to 200. Pt able to count from 1-200 with rare errors, ~95% accuracy. Self-ID 100% of  trials with use of external aid. SLP demonstrates limiting visual field with pt able to carryover with mod-I PRN during remainder of task. Occasional need for extra time to process and determine correct number. SLP provided feedback on slowing rate resulting in improved fluency during counting task.   01-27-22: SLP reviewed HEP and facilitated direct instruction for challenging items. Use of additional, similar stimuli employed to reinforce challenging phoneme/grapheme correspondence. Pt evidencing sub-optimal phoneme/grapheme correspondence at sound level which is barrier for reading and spelling. SLP provided pt with comprehensive list of orthographic representations of consonant phonemes and consonant blends to assess which require additional instruction. Of 31, pt requires model and verbal cueing to produce correct phoneme for 19. SLP provides key words, articulatory cues, and descriptive representations to reinforce target sound. Pt evidencing increased difficulty with x5 blends addressed today's session. Will continue to work on strengthening this understanding next session.   PATIENT EDUCATION: Education details: see  above Person educated: Patient Education method: Explanation, Demonstration, Verbal cues, and Handouts Education comprehension: verbalized understanding, returned demonstration, and needs further education   GOALS: Goals reviewed with patient? Yes  SHORT TERM GOALS: Target date: 02-03-2022  Pt will successfully complete moderately complex language tasks in 90% of opportunities given occasional min-A over 2 sessions Baseline: Goal status: NOT MET  2.  Pt will write 18/20 trained words accurately with occasional min-A  Baseline:  Goal status: MET  3.  During structured exercise, pt will successful demonstrate anomia strategy with rare min-A in 80% of trials  Baseline:  Goal status: PARTIALLY MET  4.  Pt will ID and attempt to correct paraphasia during structured activities  in 50% of opportunities  Baseline:  Goal status: MET   LONG TERM GOALS: Target date: 03-31-2022  Pt will complete HEP 5/7 days over 2 week period with min-A from family  Baseline:  Goal status: IN PROGRESS  2.  Pt will report improvement via patient reported outcome measure by d/c Baseline: to be completed initial therapy session Goal status: IN PROGRESS  3.  Pt will ID and attempt to correct paraphasia during structured activities in 80% of opportunities  Baseline:  Goal status: IN PROGRESS  4.  Pt will use external support to A in reading of information on phone with mod-I following direct instruction Baseline:  Goal status: IN PROGRESS  5.  Pt will generate approximate spelling of untrained words, efficacy or which to be judged by SLP being able to determine target in 80% of trials  Baseline:  Goal status: IN PROGRESS   ASSESSMENT:  CLINICAL IMPRESSION: SLP has provided skilled interventions targeting reading, writing, anomia strategies and compensations. This session focused on taxing language system to A in word finding with direct instruction for using description strategy as anomia compensation. When anomia occurs, pt with occasional benefit from semantic and phonemic cueing. Continue per POC.   OBJECTIVE IMPAIRMENTS: include memory, executive functioning, receptive language, aphasia, and apraxia. These impairments are limiting patient from managing medications, managing appointments, managing finances, household responsibilities, ADLs/IADLs, and effectively communicating at home and in community. Factors affecting potential to achieve goals and functional outcome are previous level of function, severity of impairments, financial resources, and family/community support. Patient will benefit from skilled SLP services to address above impairments and improve overall function.  REHAB POTENTIAL: Fair (prior level of function s/p stroke 2014)  PLAN:  SLP FREQUENCY: 1-2x/week  - SLP recommends 2x a week, pt elects to initiate therapy at 1x a week   SLP DURATION: 12 weeks  PLANNED INTERVENTIONS: Language facilitation, Cueing hierachy, Cognitive reorganization, Internal/external aids, Functional tasks, Multimodal communication approach, SLP instruction and feedback, Compensatory strategies, and Patient/family education  Su Monks, CCC-SLP 02/22/2022, 9:17 AM

## 2022-03-01 ENCOUNTER — Ambulatory Visit: Payer: Medicare HMO | Admitting: Speech Pathology

## 2022-03-01 ENCOUNTER — Ambulatory Visit: Payer: Medicare HMO | Admitting: Occupational Therapy

## 2022-03-01 ENCOUNTER — Encounter: Payer: Self-pay | Admitting: Occupational Therapy

## 2022-03-01 ENCOUNTER — Ambulatory Visit: Payer: Medicare HMO | Admitting: Physical Therapy

## 2022-03-01 DIAGNOSIS — R29898 Other symptoms and signs involving the musculoskeletal system: Secondary | ICD-10-CM

## 2022-03-01 DIAGNOSIS — M6281 Muscle weakness (generalized): Secondary | ICD-10-CM

## 2022-03-01 DIAGNOSIS — R2681 Unsteadiness on feet: Secondary | ICD-10-CM | POA: Diagnosis not present

## 2022-03-01 DIAGNOSIS — R4701 Aphasia: Secondary | ICD-10-CM | POA: Diagnosis not present

## 2022-03-01 DIAGNOSIS — R41841 Cognitive communication deficit: Secondary | ICD-10-CM

## 2022-03-01 DIAGNOSIS — I6200 Nontraumatic subdural hemorrhage, unspecified: Secondary | ICD-10-CM | POA: Diagnosis not present

## 2022-03-01 DIAGNOSIS — R269 Unspecified abnormalities of gait and mobility: Secondary | ICD-10-CM | POA: Diagnosis not present

## 2022-03-01 DIAGNOSIS — I4819 Other persistent atrial fibrillation: Secondary | ICD-10-CM | POA: Diagnosis not present

## 2022-03-01 NOTE — Therapy (Signed)
OUTPATIENT SPEECH LANGUAGE PATHOLOGY TREATMENT   Patient Name: [name] MRN: [id number] DOB:Jul 28, 1955, 67 y.o., female Today's Date: 03/01/2022  PCP: Lennie Odor, [place] REFERRING PROVIDER: Cathlyn Parsons., PA-C  END OF SESSION:  End of Session - 03/01/22 1053     Visit Number 8    Number of Visits 25    Date for SLP Re-Evaluation 03/31/22    Authorization Type [employer]    Progress Note Due on Visit 10    SLP Start Time 1015    SLP Stop Time  1100    SLP Time Calculation (min) 45 min    Activity Tolerance Patient tolerated treatment well                 Past Medical History:  Diagnosis Date   Abnormal chest CT    Anemia    Aortic atherosclerosis (HCC)    Asthma    Atrial enlargement, left    severe   Atrial fibrillation ([place]) 01/05/2011   Chronic persistent, failed DCCV    Atrial flutter (HCC)    Bronchospasm 1998   Cardiomegaly    Carotid bruit    Chronic diastolic congestive heart failure ([place])    Diabetes mellitus    Type II   Ejection fraction < 50%    35-40%,    Heart murmur    History of blood transfusion    History of radiation therapy 03/18/19-04/15/19   endometrial - vaginal brachytherapy -  Dr. Sondra Come    Hypercholesterolemia    Hypertension    Junctional rhythm 09/14/2018   Noted on EKG   Left bundle branch block (LBBB) 09/14/2018   Noted on EKG   LVH (left ventricular hypertrophy) 10/10/2016   Moderate, Noted on ECHO   Mitral regurgitation    Obesity    Obesity (BMI 30-39.9) 01/16/2012   Persistent atrial fibrillation ([place])    Polyp of rectum    Rheumatic fever 01/16/2012   Reported during childhood   S/P Maze operation for atrial fibrillation 02/15/2012   Complete biatrial lesion set using bipolar radiofrequency and cryothermy ablation with clipping of LA appendage   S/P mitral valve replacement with metallic valve [id number]   23m Sorin [employer] Optiform mechanical prosthesis   S/P tricuspid valve repair  02/15/2012   242mEdwards mc3 ring annuloplasty   Shortness of breath    with exertion   Sleep apnea    DOES NOT HAVE CPAP   Stroke ([facility] [place]   Post op , left arm weakness   Tricuspid regurgitation 01/16/2012   [facility]   Past Surgical History:  Procedure Laterality Date   CARDIAC CATHETERIZATION  >5 years   CARDIOVASCULAR STRESS TEST  09/2010   CARDIOVERSION  03/25/2011   Procedure: CARDIOVERSION;  Surgeon: JaJettie BoozeMD;  [facility] Service: Cardiovascular;  Laterality: N/A;   CARDIOVERSION N/A 07/27/2012   Procedure: CARDIOVERSION;  Surgeon: JaJettie BoozeMD;  [facility] Service: Cardiovascular;  Laterality: N/A;   CESAREAN SECTION     COLONOSCOPY WITH PROPOFOL N/A 04/27/2016   Procedure: COLONOSCOPY WITH PROPOFOL;  Surgeon: ViWilford CornerMD;  [facility];  Service: Endoscopy;  Laterality: N/A;   CRANIOTOMY Left 12/03/2021   Procedure: FRONTOTEMPORAL CRANIOTOMY FOR EVACUATION SUBDURAL HEMATOMA;  Surgeon: DaKarsten RoDO;  [facility] Service: Neurosurgery;  Laterality: Left;   INTRAOPERATIVE TRANSESOPHAGEAL ECHOCARDIOGRAM  02/15/2012   Procedure: INTRAOPERATIVE TRANSESOPHAGEAL ECHOCARDIOGRAM;  Surgeon: ClRexene AlbertsMD;  Location:  [place] [facility];  Service: Open Heart Surgery;  Laterality: N/A;   MAZE  02/15/2012   Procedure: MAZE;  Surgeon: Rexene Alberts, MD;  Location: [place];  Service: Open Heart Surgery;  Laterality: N/A;   MITRAL VALVE REPLACEMENT  02/15/2012   Procedure: MITRAL VALVE (MV) REPLACEMENT;  Surgeon: Rexene Alberts, MD;  Location: [place];  Service: Open Heart Surgery;  Laterality: N/A;   ROBOTIC ASSISTED LAPAROSCOPIC HYSTERECTOMY AND SALPINGECTOMY Bilateral 01/02/2019   Procedure: XI ROBOTIC ASSISTED LAPAROSCOPIC TOTAL HYSTERECTOMY WITH BILATERAL SALPINGOOPHORECTOMY, SENTINEL LYMPH NODE BIOPSY;  Surgeon: [name], MD;  Location: [facility];  Service: Gynecology;  Laterality: Bilateral;   TEE  WITHOUT CARDIOVERSION  12/21/2011   Procedure: TRANSESOPHAGEAL ECHOCARDIOGRAM (TEE);  Surgeon: Jettie Booze, MD;  Location: [place];  Service: Cardiovascular;  Laterality: N/A;   TOOTH EXTRACTION N/A 11/19/2021   Procedure: DENTAL EXTRACTION TEETH NUMBER 14, 19, 30, 31;  Surgeon: Diona Browner, DMD;  Location: [place];  Service: Oral Surgery;  Laterality: N/A;   TRICUSPID VALVE REPLACEMENT  02/15/2012   Procedure: TRICUSPID VALVE REPAIR;  Surgeon: Rexene Alberts, MD;  Location: [place];  Service: Open Heart Surgery;  Laterality: N/A;   TUBAL LIGATION     at time of her c-section   Patient Active Problem List   Diagnosis Date Noted   Hypertension associated with diabetes ([place]) 12/08/2021   Anticoagulated on warfarin 12/07/2021   Acute subdural hematoma ([place]) 12/02/2021   Chronic anticoagulation - on coumadin for mechanical mitral valve, afib 12/02/2021   Subdural hematoma, nontraumatic ([place]) 12/02/2021   Pancytopenia, acquired ([place]) 06/21/2019   Peripheral neuropathy due to chemotherapy ([place]) 06/21/2019   Anemia, chronic disease 05/21/2019   Drug-induced hyperglycemia 02/25/2019   Stage 3a chronic kidney disease (CKD) ([place]) 02/04/2019   Iron deficiency anemia 01/18/2019   Endometrial cancer ([place]) 01/02/2019   Uterine cancer ([place]) 12/27/2018   Special screening for malignant neoplasms, colon 04/27/2016   LBBB (left bundle branch block) 10/07/2013   Encounter for therapeutic drug monitoring 03/19/2013   Mitral valve disorders(424.0) 10/12/2012   Heart valve replaced by other means 10/12/2012   Atrial flutter ([place])    CVA (cerebral infarction) 04/14/2012   S/P TVR (tricuspid valve repair) 03/19/2012   S/P MVR (mitral valve replacement) 03/19/2012   History of mitral valve replacement with mechanical valve 02/15/2012   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   S/P Maze operation for atrial fibrillation 02/15/2012   CHF (congestive heart failure) ([place]) 02/06/2012   MR (mitral  regurgitation) 02/06/2012   Tricuspid regurgitation 01/16/2012   Rheumatic fever 01/16/2012   Obesity (BMI 30-39.9) 01/16/2012   Mitral regurgitation    Chronic diastolic CHF (congestive heart failure) ([place]) 12/19/2011   Snoring 02/28/2011   Atrial fibrillation ([place]) [id number]   Chronic systolic dysfunction of left ventricle 01/05/2011   Hypertension 10/18/2010   Type 2 diabetes mellitus ([place]) 10/18/2010   Hypercholesterolemia 10/18/2010   Mediastinal lymphadenopathy 10/18/2010   Asthma 10/18/2010    ONSET DATE: 12-02-2021   REFERRING DIAG: [id number] (ICD-10-CM) - SDH (subdural hematoma) (HCC)   THERAPY DIAG:  Cognitive communication deficit  Aphasia  Rationale for Evaluation and Treatment: Rehabilitation  SUBJECTIVE:   SUBJECTIVE STATEMENT: "I'm doing good."  PERTINENT HISTORY: L-MCA 2014, dental procedure 11-19-2021 resulting in ongoing bleeding. Presented to ED 12-02-2021, imaging revealed acute on chronic SDH. While admitted began showing speech disturbances. Underwent L frontotemporal craniotomy for evacuation of hematoma 12-03-2021. Received ST services while admitted to inpatient rehab.   PAIN:  Are you having pain? No  FALLS: Has patient fallen in last 6 months?  No  PATIENT GOALS: "do how I did before"  OBJECTIVE:   TODAY'S TREATMENT:  03-01-22: Targeted reading time, date, and price/cost. In structured task and provided visual, pt stated time on digital clock with 100% accuracy, x1 self-correction. Pt reported greater difficulty reading analogue clocks; she stated she has analogue clocks at home. SLP discussed benefit of purchasing a digital clock to support telling time. Presented visual, pt accurately read time on analogue clock in 3/5 opportunities, but accuracy increased to 100% given min verbal cues and x1 gestural cue. Clinician re-educated pt on importance of taking her time to read, process, and then speak slowly. Given visual, pt read date accurately in  4/4 opportunities with mod-I. In a structured activity, pt read prices without requiring self-correction in 17/19 opportunities. When reading four digit-long prices, pt required x3 min verbal cues to state the price accurately.   02-22-22: Target naming and anomia compensation of description through [employer]. SLP provided education on use of SFA as rehabilitation tool to improve lexical representation with improved word finding, use as structure to aid in description in conversation, and as self-cueing strategy. Modeled how SFA is completed with simple object "sidewalk" as pt reported difficulty with this word since last ST visit. Given visual target, pt able to name 4/4 on initial attempt. Pt able to generate average of 3/6 salient features given frequent mod verbal/questioning cues. Pt reporting difficulty with numbers (e.g. telling correct time), word finding in conversation (including dates: months, days of week). SLP questions whether pt deisres to continue working on reading/writing, with pt reporting primary concern at this time is "getting it out" (re: spoken expression).   02-15-22: Pt reported doing her HEP (divergent naming task). Clinician provided resources to facilitate further HEP and encourage pt o engage in activities that support anomia strategies. Pt endorsed that she enjoys word searches and has recently started doing them again. Provided occasional verbal prompts, pt participated in generative naming activity in which she produced 10/10 relevant words in target category (clothes). SLP then demonstrated how pt can utilize word searches as a tool to practice generative naming as HEP. Pt agreeable to HEP.   02-10-22: Pt reported that she has been doing her HEP with counting. She did not use her external aid; she worked through the challenge. She stated she has not been practicing her reading and writing as much. SLP provided re-education of importance and benefit of practice at  home. SLP led pt in divergent naming task where she produced 20/26 target words, benefiting from occasional semantic cues. SLP demonstrated modified semantic feature analysis utilizing gestural, phonemic, and semantic cues to aid pt in producing target word. Provided education on use of description as anomia strategy with goal to keep conversation moving by self-cueing or getting A from communication partner. Updated HEP: Categorical naming activity   02-01-22: Given phoneme, pt able to relay accurate letter and sound in 100% of trials. Reads simples words in 5/5 trials. Pt requests to work on counting, is finding self repeating numbers impacting successful counting. SLP educates on use of external aids and demonstrates use of to aid in accurate counting up to 200. Pt able to count from 1-200 with rare errors, ~95% accuracy. Self-ID 100% of trials with use of external aid. SLP demonstrates limiting visual field with pt able to carryover with mod-I PRN during remainder of task. Occasional need for extra time to process  and determine correct number. SLP provided feedback on slowing rate resulting in improved fluency during counting task.   01-27-22: SLP reviewed HEP and facilitated direct instruction for challenging items. Use of additional, similar stimuli employed to reinforce challenging phoneme/grapheme correspondence. Pt evidencing sub-optimal phoneme/grapheme correspondence at sound level which is barrier for reading and spelling. SLP provided pt with comprehensive list of orthographic representations of consonant phonemes and consonant blends to assess which require additional instruction. Of 31, pt requires model and verbal cueing to produce correct phoneme for 19. SLP provides key words, articulatory cues, and descriptive representations to reinforce target sound. Pt evidencing increased difficulty with x5 blends addressed today's session. Will continue to work on strengthening this understanding next  session.   PATIENT EDUCATION: Education details: see above Person educated: Patient Education method: Consulting civil engineer, Demonstration, Verbal cues, and Handouts Education comprehension: verbalized understanding, returned demonstration, and needs further education   GOALS: Goals reviewed with patient? Yes  SHORT TERM GOALS: Target date: 02-03-2022  Pt will successfully complete moderately complex language tasks in 90% of opportunities given occasional min-A over 2 sessions Baseline: Goal status: NOT MET  2.  Pt will write 18/20 trained words accurately with occasional min-A  Baseline:  Goal status: MET  3.  During structured exercise, pt will successful demonstrate anomia strategy with rare min-A in 80% of trials  Baseline:  Goal status: PARTIALLY MET  4.  Pt will ID and attempt to correct paraphasia during structured activities in 50% of opportunities  Baseline:  Goal status: MET   LONG TERM GOALS: Target date: 03-31-2022  Pt will complete HEP 5/7 days over 2 week period with min-A from family  Baseline:  Goal status: MET  2.  Pt will report improvement via patient reported outcome measure by d/c Baseline: to be completed initial therapy session Goal status: IN PROGRESS  3.  Pt will ID and attempt to correct paraphasia during structured activities in 80% of opportunities  Baseline:  Goal status: IN PROGRESS  4.  Pt will use external support to A in reading of information on phone with mod-I following direct instruction Baseline:  Goal status: IN PROGRESS  5.  Pt will generate approximate spelling of untrained words, efficacy or which to be judged by SLP being able to determine target in 80% of trials  Baseline:  Goal status: IN PROGRESS   ASSESSMENT:  CLINICAL IMPRESSION: SLP has provided skilled interventions targeting reading time, dates, and prices. Assisted patient in identifying and talking through strategies she uses to self-correct when she reads these  incorrectly. Provided structured visual tasks to support pt in implementing strategies. Continue per POC.   OBJECTIVE IMPAIRMENTS: include memory, executive functioning, receptive language, aphasia, and apraxia. These impairments are limiting patient from managing medications, managing appointments, managing finances, household responsibilities, ADLs/IADLs, and effectively communicating at home and in community. Factors affecting potential to achieve goals and functional outcome are previous level of function, severity of impairments, financial resources, and family/community support. Patient will benefit from skilled SLP services to address above impairments and improve overall function.  REHAB POTENTIAL: Fair (prior level of function s/p stroke 2014)  PLAN:  SLP FREQUENCY: 1-2x/week - SLP recommends 2x a week, pt elects to initiate therapy at 1x a week   SLP DURATION: 12 weeks  PLANNED INTERVENTIONS: Language facilitation, Cueing hierachy, Cognitive reorganization, Internal/external aids, Functional tasks, Multimodal communication approach, SLP instruction and feedback, Compensatory strategies, and Patient/family education  Leroy Libman, Student-SLP 03/01/2022, 11:30 AM

## 2022-03-01 NOTE — Therapy (Signed)
OUTPATIENT OCCUPATIONAL THERAPY NEURO TREATMENT  Patient Name: [name] MRN: [id number] DOB:Sep 03, 1955, 67 y.o., female Today's Date: 03/01/2022  PCP: Lennie Odor, PA  REFERRING PROVIDER: Cathlyn Parsons, PA-C  END OF SESSION:  OT End of Session - 03/01/22 1002     Visit Number 5    Number of Visits 9    Date for OT Re-Evaluation 03/04/22    Progress Note Due on Visit 9    OT Start Time 1103    OT Stop Time 1143    OT Time Calculation (min) 40 min    Activity Tolerance Patient tolerated treatment well    Behavior During Therapy [facility] for tasks assessed/performed               Past Medical History:  Diagnosis Date   Abnormal chest CT    Anemia    Aortic atherosclerosis (HCC)    Asthma    Atrial enlargement, left    severe   Atrial fibrillation ([place]) 01/05/2011   Chronic persistent, failed DCCV    Atrial flutter ([place])    Bronchospasm 1998   Cardiomegaly    Carotid bruit    Chronic diastolic congestive heart failure ([place])    Diabetes mellitus    Type II   Ejection fraction < 50%    35-40%,    Heart murmur    History of blood transfusion    History of radiation therapy 03/18/19-04/15/19   endometrial - vaginal brachytherapy -  Dr. Sondra Come    Hypercholesterolemia    Hypertension    Junctional rhythm 09/14/2018   Noted on EKG   Left bundle branch block (LBBB) 09/14/2018   Noted on EKG   LVH (left ventricular hypertrophy) 10/10/2016   Moderate, Noted on ECHO   Mitral regurgitation    Obesity    Obesity (BMI 30-39.9) 01/16/2012   Persistent atrial fibrillation ([place])    Polyp of rectum    Rheumatic fever 01/16/2012   Reported during childhood   S/P Maze operation for atrial fibrillation 02/15/2012   Complete biatrial lesion set using bipolar radiofrequency and cryothermy ablation with clipping of LA appendage   S/P mitral valve replacement with metallic valve [id number]   45m Sorin [employer] Optiform mechanical prosthesis   S/P tricuspid valve  repair 02/15/2012   275mEdwards mc3 ring annuloplasty   Shortness of breath    with exertion   Sleep apnea    DOES NOT HAVE CPAP   [id number]   Post op , left arm weakness   Tricuspid regurgitation 01/16/2012   [facility]   Past Surgical History:  Procedure Laterality Date   CARDIAC CATHETERIZATION  >5 years   CARDIOVASCULAR STRESS TEST  09/2010   CARDIOVERSION  03/25/2011   Procedure: CARDIOVERSION;  Surgeon: JaJettie BoozeMD;  [facility] Service: Cardiovascular;  Laterality: N/A;   CARDIOVERSION N/A 07/27/2012   Procedure: CARDIOVERSION;  Surgeon: JaJettie BoozeMD;  [facility] Service: Cardiovascular;  Laterality: N/A;   CESAREAN SECTION     COLONOSCOPY WITH PROPOFOL N/A 04/27/2016   Procedure: COLONOSCOPY WITH PROPOFOL;  Surgeon: ViWilford CornerMD;  [facility];  Service: Endoscopy;  Laterality: N/A;   CRANIOTOMY Left 12/03/2021   Procedure: FRONTOTEMPORAL CRANIOTOMY FOR EVACUATION SUBDURAL HEMATOMA;  Surgeon: DaKarsten RoDO;  [facility] Service: Neurosurgery;  Laterality: Left;   INTRAOPERATIVE TRANSESOPHAGEAL ECHOCARDIOGRAM  02/15/2012   Procedure: INTRAOPERATIVE TRANSESOPHAGEAL ECHOCARDIOGRAM;  Surgeon: ClRexene AlbertsMD;  Location[facility];  Service: Open Heart Surgery;  Laterality: N/A;   MAZE  02/15/2012   Procedure: MAZE;  Surgeon: Rexene Alberts, MD;  Location: [place];  Service: Open Heart Surgery;  Laterality: N/A;   MITRAL VALVE REPLACEMENT  02/15/2012   Procedure: MITRAL VALVE (MV) REPLACEMENT;  Surgeon: Rexene Alberts, MD;  Location: [place];  Service: Open Heart Surgery;  Laterality: N/A;   ROBOTIC ASSISTED LAPAROSCOPIC HYSTERECTOMY AND SALPINGECTOMY Bilateral 01/02/2019   Procedure: XI ROBOTIC ASSISTED LAPAROSCOPIC TOTAL HYSTERECTOMY WITH BILATERAL SALPINGOOPHORECTOMY, SENTINEL LYMPH NODE BIOPSY;  Surgeon: [name], MD;  Location: [facility];  Service: Gynecology;  Laterality: Bilateral;    TEE WITHOUT CARDIOVERSION  12/21/2011   Procedure: TRANSESOPHAGEAL ECHOCARDIOGRAM (TEE);  Surgeon: Jettie Booze, MD;  Location: [place];  Service: Cardiovascular;  Laterality: N/A;   TOOTH EXTRACTION N/A 11/19/2021   Procedure: DENTAL EXTRACTION TEETH NUMBER 14, 19, 30, 31;  Surgeon: Diona Browner, DMD;  Location: [place];  Service: Oral Surgery;  Laterality: N/A;   TRICUSPID VALVE REPLACEMENT  02/15/2012   Procedure: TRICUSPID VALVE REPAIR;  Surgeon: Rexene Alberts, MD;  Location: [place];  Service: Open Heart Surgery;  Laterality: N/A;   TUBAL LIGATION     at time of her c-section   Patient Active Problem List   Diagnosis Date Noted   Hypertension associated with diabetes ([place]) 12/08/2021   Anticoagulated on warfarin 12/07/2021   Acute subdural hematoma ([place]) 12/02/2021   Chronic anticoagulation - on coumadin for mechanical mitral valve, afib 12/02/2021   Subdural hematoma, nontraumatic ([place]) 12/02/2021   Pancytopenia, acquired ([place]) 06/21/2019   Peripheral neuropathy due to chemotherapy ([place]) 06/21/2019   Anemia, chronic disease 05/21/2019   Drug-induced hyperglycemia 02/25/2019   Stage 3a chronic kidney disease (CKD) ([place]) 02/04/2019   Iron deficiency anemia 01/18/2019   Endometrial cancer ([place]) 01/02/2019   Uterine cancer ([place]) 12/27/2018   Special screening for malignant neoplasms, colon 04/27/2016   LBBB (left bundle branch block) 10/07/2013   Encounter for therapeutic drug monitoring 03/19/2013   Mitral valve disorders(424.0) 10/12/2012   Heart valve replaced by other means 10/12/2012   Atrial flutter ([place])    CVA (cerebral infarction) 04/14/2012   S/P TVR (tricuspid valve repair) 03/19/2012   S/P MVR (mitral valve replacement) 03/19/2012   History of mitral valve replacement with mechanical valve 02/15/2012   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   S/P Maze operation for atrial fibrillation 02/15/2012   CHF (congestive heart failure) ([place]) 02/06/2012   MR (mitral  regurgitation) 02/06/2012   Tricuspid regurgitation 01/16/2012   Rheumatic fever 01/16/2012   Obesity (BMI 30-39.9) 01/16/2012   Mitral regurgitation    Chronic diastolic CHF (congestive heart failure) ([place]) 12/19/2011   Snoring 02/28/2011   Atrial fibrillation ([place]) [id number]   Chronic systolic dysfunction of left ventricle 01/05/2011   Hypertension 10/18/2010   Type 2 diabetes mellitus ([place]) 10/18/2010   Hypercholesterolemia 10/18/2010   Mediastinal lymphadenopathy 10/18/2010   Asthma 10/18/2010    ONSET DATE: Pt had teeth extracted on 01/19/2021 with continued bleeding; she presented the ED on 12/01/2021; unknown onset date of SDH; admitted to IRF 12/10/2021; discharged 12/16/2021  REFERRING DIAG: S06.5XAA (ICD-10-CM) - SDH (subdural hematoma)   THERAPY DIAG:  Muscle weakness (generalized)  Other symptoms and signs involving the musculoskeletal system  Subdural hematoma, nontraumatic (HCC)  Abnormality of gait and mobility  Rationale for Evaluation and Treatment: Rehabilitation  SUBJECTIVE:   SUBJECTIVE STATEMENT: Denies pain  Pt accompanied by: self  PERTINENT  HISTORY:   "ADYSON STANISLAWSKI is a 67 y.o. female with history of CHF, OSA, CKD, MVR-chronic Coumadin, left MCA stroke 2014, recent dental extraction who was evaluated in the ED 12/02/21 due to issues with ongoing bleeding since her procedure.  She will did report headaches which was followed by somnolence with confusion and CT of head done showing acute on chronic SDH with 16 mm shift to the left, 8 mm left to right midline shift and effacement of left greater than right psych eye.  INR at admission was 2.4 and was reversed with Kcentra.  Dr. Reatha Armour was consulted and she underwent left frontotemporal craniotomy for evacuation of hematoma on 11/24.  She was started on Keppra for seizure prophylaxis and cleared to start IV heparin on 11/27.  Recommendations were to repeat CT once heparin was therapeutic prior to transition to  oral AC.     On 11/29 she had significant worsening of aphasia, problems with ADL and decreased ability to follow commands with more of a left bias with left gaze preference.  EEG was done showing area of epileptogenicity arising from the left frontal region with cortical dysfunction left hemisphere.  Repeat CT head showed mixed density left frontal parietal and temporal lobe which was unchanged and new small right temporal SDH which was unchanged.  She was cleared to resume Coumadin.  She had improvement in motor planning deficits with no overt left neglect or bias per recent therapy notes.  She continued to be intact bilaterally, motor planning deficits, minimal verbalization with decreasing carryover.  CIR was recommended due to functional decline."  PRECAUTIONS: Fall and Other: No driving for at least 6 months or  till cleared by neurology.   WEIGHT BEARING RESTRICTIONS: No  PAIN:  Are you having pain? No  FALLS: Has patient fallen in last 6 months? No  LIVING ENVIRONMENT: Lives with: lives with their spouse and lives with their son Lives in: House/apartment Stairs: Yes: External: 1 steps; none Has following equipment at home: Quad cane large base, shower chair, and bed side commode  PLOF: Independent; driving; picked up grandkids  PATIENT GOALS: return to PLOF  OBJECTIVE: (from evaluation unless otherwise noted)  HAND DOMINANCE: Right  ADLs: Overall ADLs: mod I  IADLs: Requires min A with most due to mobility and cognition  MOBILITY STATUS: Independent  ACTIVITY TOLERANCE: Activity tolerance: good  FUNCTIONAL OUTCOME MEASURES: FOTO: Severity: Slight (Intake FS: 88) ; expected score at d/c: 95  UPPER EXTREMITY ROM:     AROM Right (eval) Left (eval)  Shoulder flexion WNL WNL  Shoulder abduction WNL WNL  Elbow flexion WNL WNL  Elbow extension WNL WNL  Wrist flexion WNL WNL  Wrist extension WNL WNL  Wrist pronation WNL WNL  Wrist supination WNL WNL   Digit  Composite Flexion WNL WNL  Digit Composite Extension WNL WNL  Digit Opposition WNL Dyskinesia noted  (Blank rows = not tested)  UPPER EXTREMITY MMT:     MMT Right (eval) Left (eval)  Shoulder flexion WNL WNL  Shoulder abduction WNL WNL  Elbow flexion WNL WNL  Elbow extension WNL WNL  (Blank rows = not tested)  HAND FUNCTION: Grip strength: Right: 66.3 lbs; Left: 46.7 lbs 02/15/2022 - 49.6 lbs   COORDINATION: 9 Hole Peg test: Right: 31 sec; Left: 40 sec  SENSATION: WFL  EDEMA: none  MUSCLE TONE: RUE: Within functional limits and LUE: Within functional limits  COGNITION: Overall cognitive status: Impaired; see ST eval  VISION: Subjective report: no  changes Baseline vision: Wears glasses for reading only PRN; chose not to drive at night Visual history:  n/a  VISION ASSESSMENT: WFL  PERCEPTION: WFL  PRAXIS: WFL  OBSERVATIONS: Pt ambulates with quadcane. No LOB noted. Pt appears well kept and is able to answer most questions appropriately with increased time. Pt able to write full name without error, but when asked to write the day of the week, "Thursday", she wrote "Th[place]".    TODAY'S TREATMENT:                                                                                                                              03/01/22- BP 157/83   Pt performed small peg puzzle 6 different colored triangles, mod v.c for correct design, min difficulty for fine motor coordination increased time required using LUE  Graded clothespins placing and removing from antennae for sustained pinch 1-8# reaching with LUE, seated and standing  Arm bike x 5 mins level 3 for conditioning, v.c to maintain 30 rpm  Twisting resistive forearm exerciser, palm down and palm up 10 reps for increased UE strength   Gripper set at level 2 black splint to pick up 1 inch blocks for sustained grip, min difficulty      PATIENT EDUCATION: Education details: see above Person educated:  Patient Education method: Explanation Education comprehension: verbalized understanding   HOME EXERCISE PROGRAM: 12/28 - LUE red putty hep 2/1 - L coordination HEP   GOALS:  SHORT TERM GOALS: Target date: 02/03/2022   Patient will be independent with initial LUE HEP. Baseline: Goal status: MET  2.  Patient will demonstrate at least 50 lbs L grip strength as needed to open jars and other containers. Baseline: 46.7 lbs 2/6: 49.6 lbs Goal status: IN PROGRESS   LONG TERM GOALS: Target date: 03/04/2022    Patient will demonstrate updated LUE HEP with 25% verbal cues or less for proper execution. Baseline:  Goal status: INITIAL  2.  Patient will demonstrate at least 56.7 lbs L grip strength as needed to open jars and other containers. Baseline: 46.7 lbs Goal status:  IN PROGRESS  3.  Patient will complete nine-hole peg with use of L in 32 seconds or less. Baseline: 40 seconds Goal status: INITIAL  4.  Pt will complete FOTO assessment at time of discharge scoring 95 or greater indicating functional progression with ADL and IADL completion.. Baseline: 88 Goal status: INITIAL  5.  Pt will report completing typical IADLs mod I with good safety as evidenced in therapy clinic.  Baseline: requiring at least min A with supervision due to cognition Goal status: INITIAL  ASSESSMENT:  CLINICAL IMPRESSION: Pt is progressing towards goals with improving fine motor coordination and strength  for LUE.  PERFORMANCE DEFICITS: in functional skills including ADLs, IADLs, coordination, strength, and UE functional use, cognitive skills including memory, problem solving, safety awareness, sequencing, and understand.  IMPAIRMENTS: are limiting patient from ADLs, IADLs, leisure, and social  participation.   CO-MORBIDITIES: may have co-morbidities  that affects occupational performance. Patient will benefit from skilled OT to address above impairments and improve overall function.  REHAB  POTENTIAL: Good  PLAN:  OT FREQUENCY: 1x/week  OT DURATION: 8 weeks  PLANNED INTERVENTIONS: self care/ADL training, therapeutic exercise, therapeutic activity, neuromuscular re-education, functional mobility training, moist heat, patient/family education, cognitive remediation/compensation, visual/perceptual remediation/compensation, energy conservation, DME and/or AE instructions, and Re-evaluation  RECOMMENDED OTHER SERVICES: none at this time  CONSULTED AND AGREED WITH PLAN OF CARE: Patient  PLAN FOR NEXT SESSION:  check long term goals, pt is only scheduled for 1 additional visit, renew vs. D/c functional/cognitive tasks; higher level coordination; L hand strengthening  Cesare Sumlin, OT 03/01/2022, 10:03 AM

## 2022-03-04 ENCOUNTER — Ambulatory Visit: Payer: Medicare HMO | Attending: Interventional Cardiology | Admitting: *Deleted

## 2022-03-04 DIAGNOSIS — Z5181 Encounter for therapeutic drug level monitoring: Secondary | ICD-10-CM

## 2022-03-04 DIAGNOSIS — I480 Paroxysmal atrial fibrillation: Secondary | ICD-10-CM | POA: Diagnosis not present

## 2022-03-04 DIAGNOSIS — Z954 Presence of other heart-valve replacement: Secondary | ICD-10-CM

## 2022-03-04 LAB — POCT INR: POC INR: 4.5

## 2022-03-04 NOTE — Patient Instructions (Signed)
Description   Hold warfarin today and then START taking warfarin 1.5 tablets daily except for 1 tablet on Tuesday and Saturdays. Recheck INR in 2 weeks. [facility] [phone]

## 2022-03-08 ENCOUNTER — Ambulatory Visit: Payer: Medicare HMO | Admitting: Physical Therapy

## 2022-03-08 ENCOUNTER — Ambulatory Visit: Payer: Medicare HMO | Admitting: Occupational Therapy

## 2022-03-08 ENCOUNTER — Encounter: Payer: Self-pay | Admitting: Occupational Therapy

## 2022-03-08 ENCOUNTER — Ambulatory Visit: Payer: Medicare HMO | Admitting: Speech Pathology

## 2022-03-08 DIAGNOSIS — R29898 Other symptoms and signs involving the musculoskeletal system: Secondary | ICD-10-CM | POA: Diagnosis not present

## 2022-03-08 DIAGNOSIS — M6281 Muscle weakness (generalized): Secondary | ICD-10-CM | POA: Diagnosis not present

## 2022-03-08 DIAGNOSIS — I6200 Nontraumatic subdural hemorrhage, unspecified: Secondary | ICD-10-CM | POA: Diagnosis not present

## 2022-03-08 DIAGNOSIS — R41841 Cognitive communication deficit: Secondary | ICD-10-CM | POA: Diagnosis not present

## 2022-03-08 DIAGNOSIS — R4701 Aphasia: Secondary | ICD-10-CM | POA: Diagnosis not present

## 2022-03-08 DIAGNOSIS — R2681 Unsteadiness on feet: Secondary | ICD-10-CM | POA: Diagnosis not present

## 2022-03-08 DIAGNOSIS — R269 Unspecified abnormalities of gait and mobility: Secondary | ICD-10-CM | POA: Diagnosis not present

## 2022-03-08 DIAGNOSIS — I4819 Other persistent atrial fibrillation: Secondary | ICD-10-CM | POA: Diagnosis not present

## 2022-03-08 NOTE — Therapy (Signed)
OUTPATIENT SPEECH LANGUAGE PATHOLOGY TREATMENT   Patient Name: [name] MRN: [id number] DOB:February 04, 1955, 67 y.o., female Today's Date: 03/08/2022  PCP: Lennie Odor, [place] REFERRING PROVIDER: Cathlyn Parsons., PA-C  END OF SESSION:  End of Session - 03/08/22 0919     Visit Number 9    Number of Visits 25    Date for SLP Re-Evaluation 03/31/22    Authorization Type [employer]    Progress Note Due on Visit 10    SLP Start Time 0930    SLP Stop Time  [id number]    SLP Time Calculation (min) 45 min    Activity Tolerance Patient tolerated treatment well             Past Medical History:  Diagnosis Date   Abnormal chest CT    Anemia    Aortic atherosclerosis (HCC)    Asthma    Atrial enlargement, left    severe   Atrial fibrillation ([place]) 01/05/2011   Chronic persistent, failed DCCV    Atrial flutter (HCC)    Bronchospasm 1998   Cardiomegaly    Carotid bruit    Chronic diastolic congestive heart failure (HCC)    Diabetes mellitus    Type II   Ejection fraction < 50%    35-40%,    Heart murmur    History of blood transfusion    History of radiation therapy 03/18/19-04/15/19   endometrial - vaginal brachytherapy -  Dr. Sondra Come    Hypercholesterolemia    Hypertension    Junctional rhythm 09/14/2018   Noted on EKG   Left bundle branch block (LBBB) 09/14/2018   Noted on EKG   LVH (left ventricular hypertrophy) 10/10/2016   Moderate, Noted on ECHO   Mitral regurgitation    Obesity    Obesity (BMI 30-39.9) 01/16/2012   Persistent atrial fibrillation ([place])    Polyp of rectum    Rheumatic fever 01/16/2012   Reported during childhood   S/P Maze operation for atrial fibrillation 02/15/2012   Complete biatrial lesion set using bipolar radiofrequency and cryothermy ablation with clipping of LA appendage   S/P mitral valve replacement with metallic valve [id number]   61m Sorin [employer] Optiform mechanical prosthesis   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    232mEdwards mc3 ring annuloplasty   Shortness of breath    with exertion   Sleep apnea    DOES NOT HAVE CPAP   [place]   Post op , left arm weakness   Tricuspid regurgitation 01/16/2012   [facility]   Past Surgical History:  Procedure Laterality Date   CARDIAC CATHETERIZATION  >5 years   CARDIOVASCULAR STRESS TEST  09/2010   CARDIOVERSION  03/25/2011   Procedure: CARDIOVERSION;  Surgeon: JaJettie BoozeMD;  [facility] Service: Cardiovascular;  Laterality: N/A;   CARDIOVERSION N/A 07/27/2012   Procedure: CARDIOVERSION;  Surgeon: JaJettie BoozeMD;  [facility] Service: Cardiovascular;  Laterality: N/A;   CESAREAN SECTION     COLONOSCOPY WITH PROPOFOL N/A 04/27/2016   Procedure: COLONOSCOPY WITH PROPOFOL;  Surgeon: ViWilford CornerMD;  [facility];  Service: Endoscopy;  Laterality: N/A;   CRANIOTOMY Left 12/03/2021   Procedure: FRONTOTEMPORAL CRANIOTOMY FOR EVACUATION SUBDURAL HEMATOMA;  Surgeon: DaKarsten RoDO;  [facility]: [facility] Service: Neurosurgery;  Laterality: Left;   INTRAOPERATIVE TRANSESOPHAGEAL ECHOCARDIOGRAM  02/15/2012   Procedure: INTRAOPERATIVE TRANSESOPHAGEAL ECHOCARDIOGRAM;  Surgeon: ClRexene AlbertsMD;  [facility] Service:  Open Heart Surgery;  Laterality: N/A;   MAZE  02/15/2012   Procedure: MAZE;  Surgeon: Rexene Alberts, MD;  Location: [place];  Service: Open Heart Surgery;  Laterality: N/A;   MITRAL VALVE REPLACEMENT  02/15/2012   Procedure: MITRAL VALVE (MV) REPLACEMENT;  Surgeon: Rexene Alberts, MD;  Location: [place];  Service: Open Heart Surgery;  Laterality: N/A;   ROBOTIC ASSISTED LAPAROSCOPIC HYSTERECTOMY AND SALPINGECTOMY Bilateral 01/02/2019   Procedure: XI ROBOTIC ASSISTED LAPAROSCOPIC TOTAL HYSTERECTOMY WITH BILATERAL SALPINGOOPHORECTOMY, SENTINEL LYMPH NODE BIOPSY;  Surgeon: [name], MD;  Location[facility];  Service: Gynecology;  Laterality: Bilateral;   TEE WITHOUT  CARDIOVERSION  12/21/2011   Procedure: TRANSESOPHAGEAL ECHOCARDIOGRAM (TEE);  Surgeon: Jettie Booze, MD;  Location: [place];  Service: Cardiovascular;  Laterality: N/A;   TOOTH EXTRACTION N/A 11/19/2021   Procedure: DENTAL EXTRACTION TEETH NUMBER 14, 19, 30, 31;  Surgeon: Diona Browner, DMD;  Location: [place];  Service: Oral Surgery;  Laterality: N/A;   TRICUSPID VALVE REPLACEMENT  02/15/2012   Procedure: TRICUSPID VALVE REPAIR;  Surgeon: Rexene Alberts, MD;  Location: [place];  Service: Open Heart Surgery;  Laterality: N/A;   TUBAL LIGATION     at time of her c-section   Patient Active Problem List   Diagnosis Date Noted   Hypertension associated with diabetes ([place]) 12/08/2021   Anticoagulated on warfarin 12/07/2021   Acute subdural hematoma ([place]) 12/02/2021   Chronic anticoagulation - on coumadin for mechanical mitral valve, afib 12/02/2021   Subdural hematoma, nontraumatic ([place]) 12/02/2021   Pancytopenia, acquired ([place]) 06/21/2019   Peripheral neuropathy due to chemotherapy ([place]) 06/21/2019   Anemia, chronic disease 05/21/2019   Drug-induced hyperglycemia 02/25/2019   Stage 3a chronic kidney disease (CKD) ([place]) 02/04/2019   Iron deficiency anemia 01/18/2019   Endometrial cancer ([place]) 01/02/2019   Uterine cancer ([place]) 12/27/2018   Special screening for malignant neoplasms, colon 04/27/2016   LBBB (left bundle branch block) 10/07/2013   Encounter for therapeutic drug monitoring 03/19/2013   Mitral valve disorders(424.0) 10/12/2012   Heart valve replaced by other means 10/12/2012   Atrial flutter ([place])    CVA (cerebral infarction) 04/14/2012   S/P TVR (tricuspid valve repair) 03/19/2012   S/P MVR (mitral valve replacement) 03/19/2012   History of mitral valve replacement with mechanical valve 02/15/2012   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   S/P Maze operation for atrial fibrillation 02/15/2012   CHF (congestive heart failure) ([place]) 02/06/2012   MR (mitral  regurgitation) 02/06/2012   Tricuspid regurgitation 01/16/2012   Rheumatic fever 01/16/2012   Obesity (BMI 30-39.9) 01/16/2012   Mitral regurgitation    Chronic diastolic CHF (congestive heart failure) ([place]) 12/19/2011   Snoring 02/28/2011   Atrial fibrillation ([place]) [id number]   Chronic systolic dysfunction of left ventricle 01/05/2011   Hypertension 10/18/2010   Type 2 diabetes mellitus ([place]) 10/18/2010   Hypercholesterolemia 10/18/2010   Mediastinal lymphadenopathy 10/18/2010   Asthma 10/18/2010    ONSET DATE: 12-02-2021   REFERRING DIAG: [id number] (ICD-10-CM) - SDH (subdural hematoma) (HCC)   THERAPY DIAG:  Cognitive communication deficit  Aphasia  Rationale for Evaluation and Treatment: Rehabilitation  SUBJECTIVE:   SUBJECTIVE STATEMENT: "I'm good. I'm going to see my other doctor tomorrow for my head."   PERTINENT HISTORY: L-[facility] 2014, dental procedure 11-19-2021 resulting in ongoing bleeding. Presented to ED 12-02-2021, imaging revealed acute on chronic SDH. While admitted began showing speech disturbances. Underwent L frontotemporal craniotomy for evacuation of hematoma 12-03-2021. Received ST services while  admitted to inpatient rehab.   PAIN:  Are you having pain? No  FALLS: Has patient fallen in last 6 months?  No  PATIENT GOALS: "do how I did before"  OBJECTIVE:   TODAY'S TREATMENT:  03-08-22: Pt endorsed that word-finding is going well. Returned with completed HEP. Given structured word generation task (with visual), pt generated 30 unique words. Given mod-A to correct spelling errors x2. Pt stated that the manner she uses her phone meets her needs. SLP continued education concerning anomia strategies and compensations. Pt agreeable to ST d/c, tells SLP she will continue working at home to improve language skills through puzzles and working with grandson on his homework as he learns to read/spell. Verbalizes appreciation for SLP using person-centered therapy  approach.   03-01-22: Targeted reading time, date, and price/cost. In structured task and provided visual, pt stated time on digital clock with 100% accuracy, x1 self-correction. Pt reported greater difficulty reading analogue clocks; she stated she has analogue clocks at home. SLP discussed benefit of purchasing a digital clock to support telling time. Presented visual, pt accurately read time on analogue clock in 3/5 opportunities, but accuracy increased to 100% given min verbal cues and x1 gestural cue. Clinician re-educated pt on importance of taking her time to read, process, and then speak slowly. Given visual, pt read date accurately in 4/4 opportunities with mod-I. In a structured activity, pt read prices without requiring self-correction in 17/19 opportunities. When reading four digit-long prices, pt required x3 min verbal cues to state the price accurately.   02-22-22: Target naming and anomia compensation of description through [employer]. SLP provided education on use of SFA as rehabilitation tool to improve lexical representation with improved word finding, use as structure to aid in description in conversation, and as self-cueing strategy. Modeled how SFA is completed with simple object "sidewalk" as pt reported difficulty with this word since last ST visit. Given visual target, pt able to name 4/4 on initial attempt. Pt able to generate average of 3/6 salient features given frequent mod verbal/questioning cues. Pt reporting difficulty with numbers (e.g. telling correct time), word finding in conversation (including dates: months, days of week). SLP questions whether pt deisres to continue working on reading/writing, with pt reporting primary concern at this time is "getting it out" (re: spoken expression).   02-15-22: Pt reported doing her HEP (divergent naming task). Clinician provided resources to facilitate further HEP and encourage pt o engage in activities that support anomia  strategies. Pt endorsed that she enjoys word searches and has recently started doing them again. Provided occasional verbal prompts, pt participated in generative naming activity in which she produced 10/10 relevant words in target category (clothes). SLP then demonstrated how pt can utilize word searches as a tool to practice generative naming as HEP. Pt agreeable to HEP.   02-10-22: Pt reported that she has been doing her HEP with counting. She did not use her external aid; she worked through the challenge. She stated she has not been practicing her reading and writing as much. SLP provided re-education of importance and benefit of practice at home. SLP led pt in divergent naming task where she produced 20/26 target words, benefiting from occasional semantic cues. SLP demonstrated modified semantic feature analysis utilizing gestural, phonemic, and semantic cues to aid pt in producing target word. Provided education on use of description as anomia strategy with goal to keep conversation moving by self-cueing or getting A from communication partner. Updated HEP: Categorical naming activity  02-01-22: Given phoneme, pt able to relay accurate letter and sound in 100% of trials. Reads simples words in 5/5 trials. Pt requests to work on counting, is finding self repeating numbers impacting successful counting. SLP educates on use of external aids and demonstrates use of to aid in accurate counting up to 200. Pt able to count from 1-200 with rare errors, ~95% accuracy. Self-ID 100% of trials with use of external aid. SLP demonstrates limiting visual field with pt able to carryover with mod-I PRN during remainder of task. Occasional need for extra time to process and determine correct number. SLP provided feedback on slowing rate resulting in improved fluency during counting task.   01-27-22: SLP reviewed HEP and facilitated direct instruction for challenging items. Use of additional, similar stimuli employed to  reinforce challenging phoneme/grapheme correspondence. Pt evidencing sub-optimal phoneme/grapheme correspondence at sound level which is barrier for reading and spelling. SLP provided pt with comprehensive list of orthographic representations of consonant phonemes and consonant blends to assess which require additional instruction. Of 31, pt requires model and verbal cueing to produce correct phoneme for 19. SLP provides key words, articulatory cues, and descriptive representations to reinforce target sound. Pt evidencing increased difficulty with x5 blends addressed today's session. Will continue to work on strengthening this understanding next session.   PATIENT EDUCATION: Education details: see above Person educated: Patient Education method: Education officer, environmental, Verbal cues, and Handouts Education comprehension: verbalized understanding, returned demonstration, and needs further education  SPEECH THERAPY DISCHARGE SUMMARY  Visits from Start of Care: 9  Current functional level related to goals / functional outcomes: Improved confidence for employment of strategies and compensations to A in communication (spoken and written)   Remaining deficits: Cognitive-communication deficit, mild aphasia   Education / Equipment: Anomia strategies and compensations   Patient agrees to discharge. Patient goals were met. Patient is being discharged due to meeting the stated rehab goals.[name]    GOALS: Goals reviewed with patient? Yes  SHORT TERM GOALS: Target date: 02-03-2022  Pt will successfully complete moderately complex language tasks in 90% of opportunities given occasional min-A over 2 sessions Baseline: Goal status: NOT MET  2.  Pt will write 18/20 trained words accurately with occasional min-A  Baseline:  Goal status: MET  3.  During structured exercise, pt will successful demonstrate anomia strategy with rare min-A in 80% of trials  Baseline:  Goal status: PARTIALLY MET  4.   Pt will ID and attempt to correct paraphasia during structured activities in 50% of opportunities  Baseline:  Goal status: MET   LONG TERM GOALS: Target date: 03-31-2022  Pt will complete HEP 5/7 days over 2 week period with min-A from family  Baseline:  Goal status: MET  2.  Pt will report improvement via patient reported outcome measure by d/c Baseline: to be completed initial therapy session Goal status: REVISED  3.  Pt will ID and attempt to correct paraphasia during structured activities in 80% of opportunities  Baseline:  Goal status: MET  4.  Pt will use external support to A in reading of information on phone with mod-I following direct instruction Baseline:  Goal status: Deferred (Pt satisfied with current ability to navigate and utilize phone)  5.  Pt will generate approximate spelling of untrained words, efficacy or which to be judged by SLP being able to determine target in 80% of trials  Baseline:  Goal status: MET   ASSESSMENT:  CLINICAL IMPRESSION: Reviewed strategies and compensations for word finding, spelling, reading. Led  pt through language based task with pt generating x30 words given letter constraints with extended time and occasional A for spelling. Pt is pleased with progress and current functional level.   OBJECTIVE IMPAIRMENTS: include memory, executive functioning, receptive language, aphasia, and apraxia. These impairments are limiting patient from managing medications, managing appointments, managing finances, household responsibilities, ADLs/IADLs, and effectively communicating at home and in community. Factors affecting potential to achieve goals and functional outcome are previous level of function, severity of impairments, financial resources, and family/community support. Patient will benefit from skilled SLP services to address above impairments and improve overall function.  REHAB POTENTIAL: Fair (prior level of function s/p stroke  2014)  PLAN:  SLP FREQUENCY: 1-2x/week - SLP recommends 2x a week, pt elects to initiate therapy at 1x a week   SLP DURATION: 12 weeks  PLANNED INTERVENTIONS: Language facilitation, Cueing hierachy, Cognitive reorganization, Internal/external aids, Functional tasks, Multimodal communication approach, SLP instruction and feedback, Compensatory strategies, and Patient/family education  Leroy Libman, Student-SLP 03/08/2022, 9:19 AM

## 2022-03-08 NOTE — Therapy (Signed)
OUTPATIENT OCCUPATIONAL THERAPY NEURO DISCHARGE  Patient Name: [name] MRN: [id number] DOB:[birth year], 68 y.o., female Today's Date: 03/08/2022  PCP: Lennie Odor, PA  REFERRING PROVIDER: Cathlyn Parsons, PA-C  OCCUPATIONAL THERAPY DISCHARGE SUMMARY  Visits from Start of Care: 6  Current functional level related to goals / functional outcomes: Patient has met 2/2 short-term goals and 3/5 long-term goals to date.   Remaining deficits: No significant LUE functional limitations.    Education / Equipment: Continue LUE HEP as needed following OT d/c   Patient agrees to discharge. Patient goals were partially met. Patient is being discharged due to being pleased with the current functional level..     END OF SESSION:  OT End of Session - 03/08/22 1015     Visit Number 6    Number of Visits 9    Date for OT Re-Evaluation 03/04/22    Authorization Type [employer] - requires auth    Progress Note Due on Visit 9    OT Start Time 1018    OT Stop Time 1056    OT Time Calculation (min) 38 min    Activity Tolerance Patient tolerated treatment well    Behavior During Therapy WFL for tasks assessed/performed             Past Medical History:  Diagnosis Date   Abnormal chest CT    Anemia    Aortic atherosclerosis (HCC)    Asthma    Atrial enlargement, left    severe   Atrial fibrillation ([place]) 01/05/2011   Chronic persistent, failed DCCV    Atrial flutter (HCC)    Bronchospasm 1998   Cardiomegaly    Carotid bruit    Chronic diastolic congestive heart failure (HCC)    Diabetes mellitus    Type II   Ejection fraction < 50%    35-40%,    Heart murmur    History of blood transfusion    History of radiation therapy 03/18/19-04/15/19   endometrial - vaginal brachytherapy -  Dr. Sondra Come    Hypercholesterolemia    Hypertension    Junctional rhythm 09/14/2018   Noted on EKG   Left bundle branch block (LBBB) 09/14/2018   Noted on EKG   LVH (left  ventricular hypertrophy) 10/10/2016   Moderate, Noted on ECHO   Mitral regurgitation    Obesity    Obesity (BMI 30-39.9) 01/16/2012   Persistent atrial fibrillation ([place])    Polyp of rectum    Rheumatic fever 01/16/2012   Reported during childhood   S/P Maze operation for atrial fibrillation 02/15/2012   Complete biatrial lesion set using bipolar radiofrequency and cryothermy ablation with clipping of LA appendage   S/P mitral valve replacement with metallic valve [id number]   4m Sorin [employer] Optiform mechanical prosthesis   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   266mEdwards mc3 ring annuloplasty   Shortness of breath    with exertion   Sleep apnea    DOES NOT HAVE CPAP   St[id number]   Post op , left arm weakness   Tricuspid regurgitation 01/16/2012   [facility]   Past Surgical History:  Procedure Laterality Date   CARDIAC CATHETERIZATION  >5 years   CARDIOVASCULAR STRESS TEST  09/2010   CARDIOVERSION  03/25/2011   Procedure: CARDIOVERSION;  Surgeon: JaJettie BoozeMD;  [facility] Service: Cardiovascular;  Laterality: N/A;   CARDIOVERSION N/A 07/27/2012   Procedure: CARDIOVERSION;  Surgeon: JaJettie BoozeMD;  Location:  [place];  Service: Cardiovascular;  Laterality: N/A;   CESAREAN SECTION     COLONOSCOPY WITH PROPOFOL N/A 04/27/2016   Procedure: COLONOSCOPY WITH PROPOFOL;  Surgeon: Wilford Corner, MD;  Location[facility] ENDOSCOPY;  Service: Endoscopy;  Laterality: N/A;   CRANIOTOMY Left 12/03/2021   Procedure: FRONTOTEMPORAL CRANIOTOMY FOR EVACUATION SUBDURAL HEMATOMA;  Surgeon: Karsten Ro, DO;  Location: [place];  Service: Neurosurgery;  Laterality: Left;   INTRAOPERATIVE TRANSESOPHAGEAL ECHOCARDIOGRAM  02/15/2012   Procedure: INTRAOPERATIVE TRANSESOPHAGEAL ECHOCARDIOGRAM;  Surgeon: Rexene Alberts, MD;  Location: [place];  Service: Open Heart Surgery;  Laterality: N/A;   MAZE  02/15/2012   Procedure: MAZE;  Surgeon: Rexene Alberts, MD;  Location: [place];  Service: Open Heart Surgery;  Laterality: N/A;   MITRAL VALVE REPLACEMENT  02/15/2012   Procedure: MITRAL VALVE (MV) REPLACEMENT;  Surgeon: Rexene Alberts, MD;  Location: [place];  Service: Open Heart Surgery;  Laterality: N/A;   ROBOTIC ASSISTED LAPAROSCOPIC HYSTERECTOMY AND SALPINGECTOMY Bilateral 01/02/2019   Procedure: XI ROBOTIC ASSISTED LAPAROSCOPIC TOTAL HYSTERECTOMY WITH BILATERAL SALPINGOOPHORECTOMY, SENTINEL LYMPH NODE BIOPSY;  Surgeon: [name], MD;  Location: [facility];  Service: Gynecology;  Laterality: Bilateral;   TEE WITHOUT CARDIOVERSION  12/21/2011   Procedure: TRANSESOPHAGEAL ECHOCARDIOGRAM (TEE);  Surgeon: Jettie Booze, MD;  Location: [place];  Service: Cardiovascular;  Laterality: N/A;   TOOTH EXTRACTION N/A 11/19/2021   Procedure: DENTAL EXTRACTION TEETH NUMBER 14, 19, 30, 31;  Surgeon: Diona Browner, DMD;  Location: [place];  Service: Oral Surgery;  Laterality: N/A;   TRICUSPID VALVE REPLACEMENT  02/15/2012   Procedure: TRICUSPID VALVE REPAIR;  Surgeon: Rexene Alberts, MD;  Location: [place];  Service: Open Heart Surgery;  Laterality: N/A;   TUBAL LIGATION     at time of her c-section   Patient Active Problem List   Diagnosis Date Noted   Hypertension associated with diabetes ([place]) 12/08/2021   Anticoagulated on warfarin 12/07/2021   Acute subdural hematoma (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   Anemia, chronic disease 05/21/2019   Drug-induced hyperglycemia 02/25/2019   Stage 3a chronic kidney disease (CKD) ([place]) 02/04/2019   Iron deficiency anemia 01/18/2019   Endometrial cancer ([place]) 01/02/2019   Uterine cancer ([place]) 12/27/2018   Special screening for malignant neoplasms, colon 04/27/2016   LBBB (left  bundle branch block) 10/07/2013   Encounter for therapeutic drug monitoring 03/19/2013   Mitral valve disorders(424.0) 10/12/2012   Heart valve replaced by other means 10/12/2012   Atrial flutter ([place])    CVA (cerebral infarction) 04/14/2012   S/P TVR (tricuspid valve repair) 03/19/2012   S/P MVR (mitral valve replacement) 03/19/2012   History of mitral valve replacement with mechanical valve 02/15/2012   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   S/P Maze operation for atrial fibrillation 02/15/2012   CHF (congestive heart failure) ([place]) 02/06/2012   MR (mitral regurgitation) 02/06/2012   Tricuspid regurgitation 01/16/2012   Rheumatic fever 01/16/2012   Obesity (BMI 30-39.9) 01/16/2012   Mitral regurgitation    Chronic diastolic CHF (congestive heart failure) ([place]) 12/19/2011   Snoring 02/28/2011   Atrial fibrillation ([place]) [id number]   Chronic systolic dysfunction of left ventricle 01/05/2011   Hypertension 10/18/2010   Type 2 diabetes mellitus ([place]) 10/18/2010   Hypercholesterolemia 10/18/2010   Mediastinal lymphadenopathy 10/18/2010  Asthma 10/18/2010    ONSET DATE: Pt had teeth extracted on 01/19/2021 with continued bleeding; she presented the ED on 12/01/2021; unknown onset date of SDH; admitted to IRF 12/10/2021; discharged 12/16/2021  REFERRING DIAG: S06.5XAA (ICD-10-CM) - SDH (subdural hematoma)   THERAPY DIAG:  Muscle weakness (generalized)  Other symptoms and signs involving the musculoskeletal system  Rationale for Evaluation and Treatment: Rehabilitation  SUBJECTIVE:   SUBJECTIVE STATEMENT: She feels she is back to PLOF.   Pt accompanied by: self  PERTINENT HISTORY:   "CHALINA DENIGRIS is a 67 y.o. female with history of CHF, OSA, CKD, MVR-chronic Coumadin, left MCA stroke 2014, recent dental extraction who was evaluated in the ED 12/02/21 due to issues with ongoing bleeding since her procedure.  She will did report headaches which was followed by somnolence with  confusion and CT of head done showing acute on chronic SDH with 16 mm shift to the left, 8 mm left to right midline shift and effacement of left greater than right psych eye.  INR at admission was 2.4 and was reversed with Kcentra.  Dr. Reatha Armour was consulted and she underwent left frontotemporal craniotomy for evacuation of hematoma on 11/24.  She was started on Keppra for seizure prophylaxis and cleared to start IV heparin on 11/27.  Recommendations were to repeat CT once heparin was therapeutic prior to transition to oral AC.     On 11/29 she had significant worsening of aphasia, problems with ADL and decreased ability to follow commands with more of a left bias with left gaze preference.  EEG was done showing area of epileptogenicity arising from the left frontal region with cortical dysfunction left hemisphere.  Repeat CT head showed mixed density left frontal parietal and temporal lobe which was unchanged and new small right temporal SDH which was unchanged.  She was cleared to resume Coumadin.  She had improvement in motor planning deficits with no overt left neglect or bias per recent therapy notes.  She continued to be intact bilaterally, motor planning deficits, minimal verbalization with decreasing carryover.  CIR was recommended due to functional decline."  PRECAUTIONS: Fall and Other: No driving for at least 6 months or  till cleared by neurology.   WEIGHT BEARING RESTRICTIONS: No  PAIN:  Are you having pain? No  FALLS: Has patient fallen in last 6 months? No  LIVING ENVIRONMENT: Lives with: lives with their spouse and lives with their son Lives in: House/apartment Stairs: Yes: External: 1 steps; none Has following equipment at home: Quad cane large base, shower chair, and bed side commode  PLOF: Independent; driving; picked up grandkids  PATIENT GOALS: return to PLOF  OBJECTIVE: (from evaluation unless otherwise noted)  HAND DOMINANCE: Right  ADLs: Overall ADLs: mod  I  IADLs: Requires min A with most due to mobility and cognition  MOBILITY STATUS: Independent  ACTIVITY TOLERANCE: Activity tolerance: good  FUNCTIONAL OUTCOME MEASURES: FOTO: Severity: Slight (Intake FS: 88) ; expected score at d/c: 95 2/27: 76.5167 - unsure if pt interpreted the questions correctly at eval  UPPER EXTREMITY ROM:     AROM Right (eval) Left (eval) LEFT  2/27  Shoulder flexion WNL WNL   Shoulder abduction WNL WNL   Elbow flexion WNL WNL   Elbow extension WNL WNL   Wrist flexion WNL WNL   Wrist extension WNL WNL   Wrist pronation WNL WNL   Wrist supination WNL WNL    Digit Composite Flexion WNL WNL   Digit Composite Extension WNL WNL  Digit Opposition WNL Dyskinesia noted Mild dyskinesia but improvement noted  (Blank rows = not tested)  UPPER EXTREMITY MMT:     MMT Right (eval) Left (eval)  Shoulder flexion WNL WNL  Shoulder abduction WNL WNL  Elbow flexion WNL WNL  Elbow extension WNL WNL  (Blank rows = not tested)  HAND FUNCTION: Grip strength: Right: 66.3 lbs; Left: 46.7 lbs 02/15/2022 - Right: 63 lbs; Left: 49.6 lbs  03/08/2022 - Left: 55.3 lbs   COORDINATION: 9 Hole Peg test: Right: 31 sec; Left: 40 sec 03/08/2022: 25 seconds  SENSATION: WFL  EDEMA: none  MUSCLE TONE: RUE: Within functional limits and LUE: Within functional limits  COGNITION: Overall cognitive status: Impaired; see ST eval  VISION: Subjective report: no changes Baseline vision: Wears glasses for reading only PRN; chose not to drive at night Visual history:  n/a  VISION ASSESSMENT: WFL  PERCEPTION: WFL  PRAXIS: WFL  OBSERVATIONS: Pt ambulates with quadcane. No LOB noted. Pt appears well kept and is able to answer most questions appropriately with increased time. Pt able to write full name without error, but when asked to write the day of the week, "Thursday", she wrote "Thurday".    TODAY'S TREATMENT:                                                                                                                               - Self-care/home management completed for duration as noted below including: Therapist reviewed goals with patient and updated patient progression.  No additional functional limitations identified. Objective measures assessed as noted in Goals section to determine progression towards goals.  - Therapeutic exercises completed for duration as noted below including:  Gripper set at level 2 black splint to pick up 1 inch blocks for sustained grip, min difficulty  Reviewed HEP as noted in pt instructions.    PATIENT EDUCATION: Education details: see above Person educated: Patient Education method: Explanation Education comprehension: verbalized understanding   HOME EXERCISE PROGRAM: 12/28 - LUE red putty hep 2/1 - L coordination HEP   GOALS:  SHORT TERM GOALS: Target date: 02/03/2022   Patient will be independent with initial LUE HEP. Baseline: Goal status: MET  2.  Patient will demonstrate at least 50 lbs L grip strength as needed to open jars and other containers. Baseline: 46.7 lbs 2/6: 49.6 lbs 2/27: 55.3 lbs Goal status: MET   LONG TERM GOALS: Target date: 03/04/2022    Patient will demonstrate updated LUE HEP with 25% verbal cues or less for proper execution. Baseline:  Goal status: MET  2.  Patient will demonstrate at least 56.7 lbs L grip strength as needed to open jars and other containers. Baseline: 46.7 lbs 03/08/2022: 55.3 lbs Goal status:  Not Fully Met  3.  Patient will complete nine-hole peg with use of L in 32 seconds or less. Baseline: 40 seconds 2/27: 25 seconds Goal status: MET  4.  Pt will complete FOTO assessment  at time of discharge scoring 95 or greater indicating functional progression with ADL and IADL completion.. Baseline: 88 Goal status: NOT MET  5.  Pt will report completing typical IADLs mod I with good safety as evidenced in therapy clinic.  Baseline: requiring at  least min A with supervision due to cognition 2/27: completing mod I; reports back to PLOF Goal status: MET  ASSESSMENT:  CLINICAL IMPRESSION: Pt is progressing towards goals with improving fine motor coordination and strength  for LUE.  PERFORMANCE DEFICITS: in functional skills including ADLs, IADLs, coordination, strength, and UE functional use, cognitive skills including memory, problem solving, safety awareness, sequencing, and understand.  IMPAIRMENTS: are limiting patient from ADLs, IADLs, leisure, and social participation.   CO-MORBIDITIES: may have co-morbidities  that affects occupational performance. Patient will benefit from skilled OT to address above impairments and improve overall function.  REHAB POTENTIAL: Good  PLAN:  OT D/C completed  Dennis Bast, OT 03/08/2022, 5:24 PM

## 2022-03-09 DIAGNOSIS — S065XAA Traumatic subdural hemorrhage with loss of consciousness status unknown, initial encounter: Secondary | ICD-10-CM | POA: Diagnosis not present

## 2022-03-21 ENCOUNTER — Ambulatory Visit: Payer: Medicare HMO | Attending: Internal Medicine | Admitting: *Deleted

## 2022-03-21 DIAGNOSIS — Z954 Presence of other heart-valve replacement: Secondary | ICD-10-CM

## 2022-03-21 DIAGNOSIS — Z5181 Encounter for therapeutic drug level monitoring: Secondary | ICD-10-CM

## 2022-03-21 DIAGNOSIS — I480 Paroxysmal atrial fibrillation: Secondary | ICD-10-CM

## 2022-03-21 LAB — POCT INR: POC INR: 5.1

## 2022-03-21 NOTE — Patient Instructions (Signed)
Description   Hold warfarin today and tomorrow then START taking warfarin 1.5 tablets daily except for 1 tablet on Tuesday and Saturdays. Recheck INR in 2 weeks. [facility] [phone]

## 2022-03-25 DIAGNOSIS — H5203 Hypermetropia, bilateral: Secondary | ICD-10-CM | POA: Diagnosis not present

## 2022-03-25 DIAGNOSIS — H524 Presbyopia: Secondary | ICD-10-CM | POA: Diagnosis not present

## 2022-04-05 ENCOUNTER — Ambulatory Visit: Payer: Medicare HMO

## 2022-04-07 ENCOUNTER — Inpatient Hospital Stay: Payer: Medicare HMO | Attending: Gynecologic Oncology | Admitting: Gynecologic Oncology

## 2022-04-07 ENCOUNTER — Encounter: Payer: Self-pay | Admitting: Gynecologic Oncology

## 2022-04-07 ENCOUNTER — Ambulatory Visit: Payer: Medicare HMO | Attending: Cardiology | Admitting: *Deleted

## 2022-04-07 ENCOUNTER — Other Ambulatory Visit: Payer: Self-pay

## 2022-04-07 VITALS — BP 133/60 | HR 75 | Temp 98.2°F | Resp 16 | Ht 66.0 in | Wt 223.2 lb

## 2022-04-07 DIAGNOSIS — Z5181 Encounter for therapeutic drug level monitoring: Secondary | ICD-10-CM | POA: Diagnosis not present

## 2022-04-07 DIAGNOSIS — Z90710 Acquired absence of both cervix and uterus: Secondary | ICD-10-CM | POA: Insufficient documentation

## 2022-04-07 DIAGNOSIS — Z9079 Acquired absence of other genital organ(s): Secondary | ICD-10-CM | POA: Diagnosis not present

## 2022-04-07 DIAGNOSIS — Z954 Presence of other heart-valve replacement: Secondary | ICD-10-CM | POA: Diagnosis not present

## 2022-04-07 DIAGNOSIS — Z923 Personal history of irradiation: Secondary | ICD-10-CM | POA: Insufficient documentation

## 2022-04-07 DIAGNOSIS — Z9221 Personal history of antineoplastic chemotherapy: Secondary | ICD-10-CM | POA: Insufficient documentation

## 2022-04-07 DIAGNOSIS — Z90722 Acquired absence of ovaries, bilateral: Secondary | ICD-10-CM | POA: Insufficient documentation

## 2022-04-07 DIAGNOSIS — I480 Paroxysmal atrial fibrillation: Secondary | ICD-10-CM

## 2022-04-07 DIAGNOSIS — Z8542 Personal history of malignant neoplasm of other parts of uterus: Secondary | ICD-10-CM | POA: Insufficient documentation

## 2022-04-07 DIAGNOSIS — Z08 Encounter for follow-up examination after completed treatment for malignant neoplasm: Secondary | ICD-10-CM

## 2022-04-07 DIAGNOSIS — C541 Malignant neoplasm of endometrium: Secondary | ICD-10-CM

## 2022-04-07 LAB — POCT INR: POC INR: 2.6

## 2022-04-07 NOTE — Progress Notes (Signed)
Gynecologic Oncology Return Clinic Visit  04/07/22  Reason for Visit: Follow-up in the setting of high risk uterine cancer   Treatment History: Oncology History Overview Note  Clear cell features MSI stable   Endometrial cancer ([place])  09/24/2018 Initial Diagnosis   She presented with post menopausal bleeding   11/20/2018 Initial Biopsy   EMB 11/10: G3 carcinoma with clear cell features.  Pap 11/10: AGC    12/03/2018 Surgery   TRH/BSO, bil SLNs   12/05/2018 Imaging   CT C/A/P:  1. Low attenuation mass or fluid in the endometrial cavity (series 2, image 110), in keeping with known endometrial malignancy. 2. Numerous small bilateral ground-glass pulmonary nodules. Although nonspecific these are likely infectious or inflammatory given appearance, isolated manifestation of metastatic disease much less favored. 3. Enlarged mediastinal and hilar lymph nodes, likely reactive given pulmonary findings. 4. No evidence of metastatic disease in the abdomen or pelvis. 5. Nonobstructive right nephrolithiasis. 6. Aortic Atherosclerosis (ICD10-I70.0).   01/02/2019 Pathologic Stage   Stage II Gr3 (clear cell features), no LVSI, cervial stroma involved, <50% MI MSI-low  A. 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            Tumor limited to upper half of myometrium.            Leiomyoma.            See oncology table.       Serosa: Fibrous adhesions with endosalpingiosis. No malignancy.  - Cervix: Stroma involved by tumor.  - Bilateral ovaries: Inclusion cysts. No malignancy.  - Bilateral fallopian tubes: Unremarkable. No malignancy.   B. LYMPH NODE, RIGHT OBTURATOR, SENTINEL, BIOPSY:  - One of one lymph nodes negative for carcinoma (0/1).   C. LYMPH NODE, LEFT EXTERNAL ILIAC, SENTINEL, BIOPSY:  - One of one lymph nodes negative for carcinoma  (0/1).   D. LYMPH NODE, LEFT EXTERNAL ILIAC, BIOPSY:  - One of one lymph nodes negative for carcinoma (0/1).   ONCOLOGY TABLE:   UTERUS, CARCINOMA OR CARCINOSARCOMA   Procedure: Total hysterectomy and bilateral salpingo-oophorectomy  Histologic type: Poorly differentiated carcinoma with clear cell  features, see comment.  Histologic Grade: High-grade  Myometrial invasion:       Depth of invasion:9 mm       Myometrial thickness: 20 mm  Uterine Serosa Involvement: Not identified  Cervical stromal involvement: Present  Extent of involvement of other organs: Not identified  Lymphovascular invasion: Not identified  Regional Lymph Nodes:       Examined:     3 Sentinel                               0 non-sentinel                               3 total        Lymph nodes with metastasis: 0        Isolated tumor cells (<0.2 mm): 0        Micrometastasis:  (>0.2 mm and < 2.0 mm): 0        Macrometastasis: (>2.0 mm): 0        Extracapsular extension: Not applicable  Representative Tumor Block: A5  MMR / MSI testing: Will be ordered  Pathologic Stage Classification (pTNM, AJCC 8th edition):  pT2, pNX  Comments: The tumor consists of solid sheets and more tubulocystic areas with focal areas of cytoplasmic clearing. Immunohistochemistry is positive for racemase (weak), Napsin-A (focal), and p53 (wild type). ER and PR are negative. While extensive clear cell changes are not seen in the more solid components, the immunoprofile along with more definitive areas of clear cell changes favor the overall tumor to be a clear cell carcinoma. The tumor has a more pushing type of invasion. There is stromal invasion in areas of endocervical mucosa. Immunohistochemistry on the serosa adhesions reveals mesothelial cells (calretinin) and a small focus of endosalpingiosis (PAX-8, ER, MOC31). Pancytokeratin on the lymph nodes is negative.    01/18/2019 Cancer Staging   Staging form: Corpus Uteri - Carcinoma and  Carcinosarcoma, AJCC 8th Edition - Pathologic: Stage II (pT2, pN0, cM0) - Signed by Natalie Lark, MD on 01/18/2019   02/04/2019 - 05/21/2019 Chemotherapy   The patient had carboplatin and taxol   03/18/2019 - 04/15/2019 Radiation Therapy    Vagina: Pelvis HDR-brachy 30/30 6 5/5 Ir-192      06/20/2019 Imaging   IMPRESSION: 1. Interval hysterectomy. No abdominopelvic adenopathy or evidence of metastatic disease. 2. Relatively similar appearance of the chest since 12/05/2018. Mosaic attenuation throughout the lungs which could be related to air trapping or heterogeneous ground-glass. Given cardiomegaly and extensive postsurgical changes of mitral and tricuspid valve repair, ground-glass as can be seen with mild pulmonary edema is considered. 3. Similar thoracic adenopathy, favored to be reactive or related to a component of congestive heart failure. 4. Increased size of small left axillary nodes, possibly related to recent COVID-19 vaccine. Correlate with clinical history. 5. A subtle right upper lobe pulmonary nodule is felt to be similar in size but slightly more distinct in appearance today. Recommend attention on follow-up. 6. Right nephrolithiasis. 7. Pulmonary artery enlargement suggests pulmonary arterial hypertension. 8. Cholelithiasis.     Interval History: She developed bilateral subdural hematomas requiring burr hole evacuation. She developed aphasia and findings concerning for seizures. She was initially in inpatient rehab and has now moved home.  Patient reports overall doing well.  Still having some difficulty associating names with faces.  Has graduated out of therapy including speech therapy.  Had [place] shortly after getting home.  She denies any vaginal bleeding, pelvic or abdominal pain.  She reports baseline bowel bladder function.  Past Medical/Surgical History: Past Medical History:  Diagnosis Date   Abnormal chest CT    Anemia    Aortic atherosclerosis (HCC)    Asthma     Atrial enlargement, left    severe   Atrial fibrillation (HCC) 01/05/2011   Chronic persistent, failed DCCV    Atrial flutter (HCC)    Bronchospasm 1998   Cardiomegaly    Carotid bruit    Chronic diastolic congestive heart failure (HCC)    Diabetes mellitus    Type II   Ejection fraction < 50%    35-40%,    Heart murmur    History of blood transfusion    History of radiation therapy 03/18/19-04/15/19   endometrial - vaginal brachytherapy -  Dr. Sondra Come    Hypercholesterolemia    Hypertension    Junctional rhythm 09/14/2018   Noted on EKG   Left bundle branch block (LBBB) 09/14/2018   Noted on EKG   LVH (left ventricular hypertrophy) 10/10/2016   Moderate, Noted on ECHO   Mitral regurgitation    Obesity    Obesity (BMI  30-39.9) 01/16/2012   Persistent atrial fibrillation (HCC)    Polyp of rectum    Rheumatic fever 01/16/2012   Reported during childhood   S/P Maze operation for atrial fibrillation 02/15/2012   Complete biatrial lesion set using bipolar radiofrequency and cryothermy ablation with clipping of LA appendage   S/P mitral valve replacement with metallic valve [id number]   55mm Sorin [employer]omedics Optiform mechanical prosthesis   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   63mm Edwards mc3 ring annuloplasty   Shortness of breath    with exertion   Sleep apnea    DOES NOT HAVE CPAP   Stroke ([place]) 2014   Post op , left arm weakness   Tricuspid regurgitation 01/16/2012   Uterine cancer [facility])     Past Surgical History:  Procedure Laterality Date   CARDIAC CATHETERIZATION  >5 years   CARDIOVASCULAR STRESS TEST  09/2010   CARDIOVERSION  03/25/2011   Procedure: CARDIOVERSION;  Surgeon: Jettie Booze, MD;  Location: [place];  Service: Cardiovascular;  Laterality: N/A;   CARDIOVERSION N/A 07/27/2012   Procedure: CARDIOVERSION;  Surgeon: Jettie Booze, MD;  Location: [place];  Service: Cardiovascular;  Laterality: N/A;   CESAREAN SECTION      COLONOSCOPY WITH PROPOFOL N/A 04/27/2016   Procedure: COLONOSCOPY WITH PROPOFOL;  Surgeon: Wilford Corner, MD;  Location[facility] ENDOSCOPY;  Service: Endoscopy;  Laterality: N/A;   CRANIOTOMY Left 12/03/2021   Procedure: FRONTOTEMPORAL CRANIOTOMY FOR EVACUATION SUBDURAL HEMATOMA;  Surgeon: Karsten Ro, DO;  Location: [place];  Service: Neurosurgery;  Laterality: Left;   INTRAOPERATIVE TRANSESOPHAGEAL ECHOCARDIOGRAM  02/15/2012   Procedure: INTRAOPERATIVE TRANSESOPHAGEAL ECHOCARDIOGRAM;  Surgeon: Rexene Alberts, MD;  Location: [place];  Service: Open Heart Surgery;  Laterality: N/A;   MAZE  02/15/2012   Procedure: MAZE;  Surgeon: Rexene Alberts, MD;  Location: [place];  Service: Open Heart Surgery;  Laterality: N/A;   MITRAL VALVE REPLACEMENT  02/15/2012   Procedure: MITRAL VALVE (MV) REPLACEMENT;  Surgeon: Rexene Alberts, MD;  Location: [place];  Service: Open Heart Surgery;  Laterality: N/A;   ROBOTIC ASSISTED LAPAROSCOPIC HYSTERECTOMY AND SALPINGECTOMY Bilateral 01/02/2019   Procedure: XI ROBOTIC ASSISTED LAPAROSCOPIC TOTAL HYSTERECTOMY WITH BILATERAL SALPINGOOPHORECTOMY, SENTINEL LYMPH NODE BIOPSY;  Surgeon: [name], MD;  Location: [facility];  Service: Gynecology;  Laterality: Bilateral;   TEE WITHOUT CARDIOVERSION  12/21/2011   Procedure: TRANSESOPHAGEAL ECHOCARDIOGRAM (TEE);  Surgeon: Jettie Booze, MD;  Location: [place];  Service: Cardiovascular;  Laterality: N/A;   TOOTH EXTRACTION N/A 11/19/2021   Procedure: DENTAL EXTRACTION TEETH NUMBER 14, 19, 30, 31;  Surgeon: Diona Browner, DMD;  Location: [place];  Service: Oral Surgery;  Laterality: N/A;   TRICUSPID VALVE REPLACEMENT  02/15/2012   Procedure: TRICUSPID VALVE REPAIR;  Surgeon: Rexene Alberts, MD;  Location: [place];  Service: Open Heart Surgery;  Laterality: N/A;   TUBAL LIGATION     at time of her c-section    Family History  Problem Relation Age of Onset   Asthma Mother    Diabetes Mother    Hypertension Mother     Hypertension Brother    Heart attack Neg Hx    Stroke Neg Hx    Colon cancer Neg Hx    Colon polyps Neg Hx    Liver disease Neg Hx    Uterine cancer Neg Hx    Ovarian cancer Neg Hx    Breast cancer Neg Hx     Social History   Socioeconomic  History   Marital status: Married    Spouse name: Not on file   Number of children: Not on file   Years of education: Not on file   Highest education level: Not on file  Occupational History   Occupation: Conservation officer, nature    Employer: [employer]  Tobacco Use   Smoking status: Never   Smokeless tobacco: Never  Vaping Use   Vaping Use: Never used  Substance and Sexual Activity   Alcohol use: No   Drug use: No   Sexual activity: Yes    Birth control/protection: Post-menopausal  Other Topics Concern   Not on file  Social History Narrative   Pt lives in [place] with spouse.  Works at [employer]. 2 grown children, 1 grandchild   Social Determinants of Radio broadcast assistant Strain: Not on [employer] Insecurity: Not on file  Transportation Needs: Patient Declined (12/02/2021)   PRAPARE - Hydrologist (Medical): Patient declined    Lack of Transportation (Non-Medical): Patient declined  Physical Activity: Not on file  Stress: Not on file  Social Connections: Not on file    Current Medications:  Current Outpatient Medications:    acetaminophen (TYLENOL) 325 MG tablet, Take 1-2 tablets (325-650 mg total) by mouth every 4 (four) hours as needed for mild pain., Disp: , Rfl:    amiodarone (PACERONE) 200 MG tablet, TAKE 1 TABLET(200 MG) BY MOUTH DAILY, Disp: 30 tablet, Rfl: 0   amLODipine (NORVASC) 10 MG tablet, TAKE 1 TABLET EVERY DAY, Disp: 90 tablet, Rfl: 3   atorvastatin (LIPITOR) 40 MG tablet, TAKE 1 TABLET(40 MG) BY MOUTH DAILY (Patient taking differently: Take 40 mg by mouth daily.), Disp: 90 tablet, Rfl: 2   carvedilol (COREG) 12.5 MG tablet, Take 1 tablet (12.5 mg total)  by mouth 2 (two) times daily with a meal., Disp: 60 tablet, Rfl: 0   cephALEXin (KEFLEX) 500 MG capsule, Take 1 capsule (500 mg total) by mouth 3 (three) times daily., Disp: 20 capsule, Rfl: 0   docusate sodium (COLACE) 100 MG capsule, Take 1 capsule (100 mg total) by mouth 2 (two) times daily., Disp: 60 capsule, Rfl: 0   irbesartan (AVAPRO) 300 MG tablet, TAKE 1 TABLET EVERY DAY, Disp: 90 tablet, Rfl: 3   levETIRAcetam (KEPPRA) 500 MG tablet, Take 1 tablet (500 mg total) by mouth 2 (two) times daily., Disp: 180 tablet, Rfl: 4   metFORMIN (GLUCOPHAGE) 500 MG tablet, Take 0.5 tablets (250 mg total) by mouth 2 (two) times daily with a meal., Disp: 60 tablet, Rfl: 0   pantoprazole (PROTONIX) 40 MG tablet, Take 1 tablet (40 mg total) by mouth daily., Disp: 30 tablet, Rfl: 0   polyethylene glycol powder (GLYCOLAX/MIRALAX) 17 GM/SCOOP powder, Take 17 g by mouth daily., Disp: 238 g, Rfl: 0   warfarin (COUMADIN) 4 MG tablet, TAKE 1 AND 1/2 TABLETS with supper starting Friday. Appointment with coumadin clinic on Monday for further instructions., Disp: 180 tablet, Rfl: 1  Review of Systems: Denies appetite changes, fevers, chills, fatigue, unexplained weight changes. Denies hearing loss, neck lumps or masses, mouth sores, ringing in ears or voice changes. Denies cough or wheezing.  Denies shortness of breath. Denies chest pain or palpitations. Denies leg swelling. Denies abdominal distention, pain, blood in stools, constipation, diarrhea, nausea, vomiting, or early satiety. Denies pain with intercourse, dysuria, frequency, hematuria or incontinence. Denies hot flashes, pelvic pain, vaginal bleeding or vaginal discharge.   Denies joint pain,  back pain or muscle pain/cramps. Denies itching, rash, or wounds. Denies dizziness, headaches, numbness or seizures. Denies swollen lymph nodes or glands, denies easy bruising or bleeding. Denies anxiety, depression, confusion, or decreased concentration.  Physical  Exam: BP 133/60 (BP Location: Left Arm, Patient Position: Sitting)   Pulse 75   Temp 98.2 F (36.8 C) (Oral)   Resp 16   Ht 5\' 6"  (1.676 m)   Wt 223 lb 3.2 oz (101.2 kg)   SpO2 97%   BMI 36.03 kg/m  General: Alert, oriented, no acute distress. HEENT: Normocephalic, atraumatic, sclera anicteric. Chest: Clear to auscultation bilaterally.  No wheezes or rhonchi. Cardiovascular: Regular rate and rhythm, no murmurs. Abdomen: Obese, soft, nontender.  Normoactive bowel sounds.  No masses or hepatosplenomegaly appreciated.  well-healed incisions. Extremities: Grossly normal range of motion.  Warm, well perfused.  No edema bilaterally. Skin: No rashes or lesions noted. Lymphatics: No cervical, supraclavicular, or inguinal adenopathy. GU: Normal appearing external genitalia without erythema, excoriation, or lesions.  Speculum exam reveals moderately atrophic vaginal mucosa, with radiation changes evident.  No bleeding or discharge no masses noted.  Bimanual exam reveals no nodularity or masses.  Rectovaginal exam confirms findings.  Laboratory & Radiologic Studies: None new  Assessment & Plan: [name] is a 67 y.o. 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 Adjuvant platinum-based chemotherapy and vaginal brachytherapy completed in 05/2019.   She continues to be NED on exam today.  She is overall doing quite well without any lasting sequelae from treatment.  She is overall recovered from her subdural hematomas.   Per NCCN and SGO surveillance recommendations, in the setting of high risk endometrial cancer, we will continue with surveillance every 6 months. She is seeing Dr Sondra Come in October.  I have asked her to call back after the new year to schedule a visit to see me in March 2025.    We discussed the signs and symptoms that would be concerning for disease recurrence and should prompt a phone call prior to her next visit.    We discussed some  her lack of desire to be intimate with her husband.  It sounds like there has not been much discussion about this between the 2 of them.  I recommended talking to her husband about his willingness to see a relationship therapist/counselor.   22 minutes of total time was spent for this patient encounter, including preparation, face-to-face counseling with the patient and coordination of care, and documentation of the encounter.  Jeral Pinch, MD  Division of Gynecologic Oncology  Department of Obstetrics and Gynecology  [facility]

## 2022-04-07 NOTE — Patient Instructions (Signed)
It was good to see you today.  I do not see or feel any evidence of cancer recurrence on your exam.  I will see you for follow-up in 12 months. Please call the clinic after your visit with Dr. Sondra Come in October to schedule your visit to see me in March 2025.  As always, if you develop any new and concerning symptoms before your next visit, please call to see me sooner.

## 2022-04-07 NOTE — Patient Instructions (Addendum)
Description   Continue taking warfarin 1.5 tablets daily except for 1 tablet on Tuesday and Saturdays. Recheck INR in 4 weeks. Coumadin Clinic [phone]

## 2022-04-28 ENCOUNTER — Other Ambulatory Visit (HOSPITAL_COMMUNITY): Payer: Self-pay

## 2022-05-06 ENCOUNTER — Ambulatory Visit: Payer: Medicare HMO | Attending: Interventional Cardiology | Admitting: *Deleted

## 2022-05-06 DIAGNOSIS — I480 Paroxysmal atrial fibrillation: Secondary | ICD-10-CM | POA: Diagnosis not present

## 2022-05-06 DIAGNOSIS — Z954 Presence of other heart-valve replacement: Secondary | ICD-10-CM | POA: Diagnosis not present

## 2022-05-06 DIAGNOSIS — Z5181 Encounter for therapeutic drug level monitoring: Secondary | ICD-10-CM | POA: Diagnosis not present

## 2022-05-06 LAB — POCT INR: POC INR: 2.3

## 2022-05-06 NOTE — Patient Instructions (Signed)
Description   Take 2 tablets of warfarin today and then continue taking warfarin 1.5 tablets daily except for 1 tablet on Tuesday and Saturdays. Recheck INR in 3 weeks. Coumadin Clinic [phone]

## 2022-05-27 ENCOUNTER — Ambulatory Visit: Payer: Medicare HMO | Attending: Cardiology

## 2022-05-27 DIAGNOSIS — Z954 Presence of other heart-valve replacement: Secondary | ICD-10-CM | POA: Diagnosis not present

## 2022-05-27 DIAGNOSIS — I480 Paroxysmal atrial fibrillation: Secondary | ICD-10-CM | POA: Diagnosis not present

## 2022-05-27 DIAGNOSIS — Z5181 Encounter for therapeutic drug level monitoring: Secondary | ICD-10-CM | POA: Diagnosis not present

## 2022-05-27 LAB — POCT INR: INR: 1.9 — AB (ref 2.0–3.0)

## 2022-05-27 NOTE — Patient Instructions (Signed)
Take 2.5 tablets of warfarin today and then continue taking warfarin 1.5 tablets daily except for 1 tablet on Tuesday and Saturdays. Recheck INR in 3 weeks. Coumadin Clinic [phone];  EAT GREENS 3 TIMES PER WEEK

## 2022-06-17 ENCOUNTER — Ambulatory Visit: Payer: Medicare HMO | Attending: Internal Medicine

## 2022-06-17 DIAGNOSIS — I480 Paroxysmal atrial fibrillation: Secondary | ICD-10-CM

## 2022-06-17 DIAGNOSIS — Z954 Presence of other heart-valve replacement: Secondary | ICD-10-CM

## 2022-06-17 DIAGNOSIS — Z5181 Encounter for therapeutic drug level monitoring: Secondary | ICD-10-CM | POA: Diagnosis not present

## 2022-06-17 LAB — POCT INR: INR: 2.4 (ref 2.0–3.0)

## 2022-06-17 NOTE — Patient Instructions (Signed)
Take 2 tablets of warfarin today and then continue taking warfarin 1.5 tablets daily except for 1 tablet on Tuesday and Saturdays. Recheck INR in 4 weeks. Coumadin Clinic [phone];  EAT GREENS 3 TIMES PER WEEK

## 2022-07-06 DIAGNOSIS — D6869 Other thrombophilia: Secondary | ICD-10-CM | POA: Diagnosis not present

## 2022-07-06 DIAGNOSIS — I700 Atherosclerosis of aorta: Secondary | ICD-10-CM | POA: Diagnosis not present

## 2022-07-06 DIAGNOSIS — E785 Hyperlipidemia, unspecified: Secondary | ICD-10-CM | POA: Diagnosis not present

## 2022-07-06 DIAGNOSIS — N1831 Chronic kidney disease, stage 3a: Secondary | ICD-10-CM | POA: Diagnosis not present

## 2022-07-06 DIAGNOSIS — I130 Hypertensive heart and chronic kidney disease with heart failure and stage 1 through stage 4 chronic kidney disease, or unspecified chronic kidney disease: Secondary | ICD-10-CM | POA: Diagnosis not present

## 2022-07-06 DIAGNOSIS — E1122 Type 2 diabetes mellitus with diabetic chronic kidney disease: Secondary | ICD-10-CM | POA: Diagnosis not present

## 2022-07-06 DIAGNOSIS — I4819 Other persistent atrial fibrillation: Secondary | ICD-10-CM | POA: Diagnosis not present

## 2022-07-06 DIAGNOSIS — E1169 Type 2 diabetes mellitus with other specified complication: Secondary | ICD-10-CM | POA: Diagnosis not present

## 2022-07-06 DIAGNOSIS — E7800 Pure hypercholesterolemia, unspecified: Secondary | ICD-10-CM | POA: Diagnosis not present

## 2022-07-06 DIAGNOSIS — I5032 Chronic diastolic (congestive) heart failure: Secondary | ICD-10-CM | POA: Diagnosis not present

## 2022-07-15 ENCOUNTER — Ambulatory Visit: Payer: Medicare HMO | Attending: Interventional Cardiology

## 2022-07-15 DIAGNOSIS — Z954 Presence of other heart-valve replacement: Secondary | ICD-10-CM | POA: Diagnosis not present

## 2022-07-15 DIAGNOSIS — I480 Paroxysmal atrial fibrillation: Secondary | ICD-10-CM | POA: Diagnosis not present

## 2022-07-15 DIAGNOSIS — Z5181 Encounter for therapeutic drug level monitoring: Secondary | ICD-10-CM | POA: Diagnosis not present

## 2022-07-15 LAB — POCT INR: INR: 3.1 — AB (ref 2.0–3.0)

## 2022-07-15 NOTE — Patient Instructions (Signed)
continue taking warfarin 1.5 tablets daily except for 1 tablet on Tuesday and Saturdays. Recheck INR in 6 weeks. Coumadin Clinic [phone];  EAT GREENS 3 TIMES PER WEEK

## 2022-08-26 ENCOUNTER — Ambulatory Visit: Payer: Medicare HMO | Attending: Interventional Cardiology

## 2022-08-26 DIAGNOSIS — Z954 Presence of other heart-valve replacement: Secondary | ICD-10-CM

## 2022-08-26 DIAGNOSIS — Z5181 Encounter for therapeutic drug level monitoring: Secondary | ICD-10-CM | POA: Diagnosis not present

## 2022-08-26 DIAGNOSIS — I480 Paroxysmal atrial fibrillation: Secondary | ICD-10-CM | POA: Diagnosis not present

## 2022-08-26 LAB — POCT INR: INR: 3.5 — AB (ref 2.0–3.0)

## 2022-08-26 NOTE — Patient Instructions (Signed)
continue taking warfarin 1.5 tablets daily except for 1 tablet on Tuesday and Saturdays. Recheck INR in 6 weeks. Coumadin Clinic [phone];  EAT GREENS 3 TIMES PER WEEK

## 2022-09-28 ENCOUNTER — Other Ambulatory Visit: Payer: Self-pay | Admitting: Interventional Cardiology

## 2022-09-28 DIAGNOSIS — I10 Essential (primary) hypertension: Secondary | ICD-10-CM

## 2022-10-07 ENCOUNTER — Ambulatory Visit: Payer: Medicare HMO | Attending: Internal Medicine | Admitting: *Deleted

## 2022-10-07 DIAGNOSIS — Z954 Presence of other heart-valve replacement: Secondary | ICD-10-CM | POA: Diagnosis not present

## 2022-10-07 DIAGNOSIS — Z5181 Encounter for therapeutic drug level monitoring: Secondary | ICD-10-CM | POA: Diagnosis not present

## 2022-10-07 DIAGNOSIS — I480 Paroxysmal atrial fibrillation: Secondary | ICD-10-CM

## 2022-10-07 LAB — POCT INR: POC INR: 2.8

## 2022-10-07 NOTE — Patient Instructions (Signed)
Description    continue taking warfarin 1.5 tablets daily except for 1 tablet on Tuesday and Saturdays. Recheck INR in 6 weeks. Coumadin Clinic [phone];  EAT GREENS 3 TIMES PER WEEK

## 2022-10-14 ENCOUNTER — Ambulatory Visit: Payer: Medicare HMO | Attending: Interventional Cardiology | Admitting: Cardiology

## 2022-10-14 ENCOUNTER — Encounter: Payer: Self-pay | Admitting: Cardiology

## 2022-10-14 VITALS — BP 136/82 | HR 70 | Ht 66.0 in | Wt 217.0 lb

## 2022-10-14 DIAGNOSIS — I519 Heart disease, unspecified: Secondary | ICD-10-CM

## 2022-10-14 DIAGNOSIS — I10 Essential (primary) hypertension: Secondary | ICD-10-CM

## 2022-10-14 DIAGNOSIS — Z954 Presence of other heart-valve replacement: Secondary | ICD-10-CM | POA: Diagnosis not present

## 2022-10-14 NOTE — Patient Instructions (Signed)

## 2022-10-14 NOTE — Progress Notes (Signed)
Cardiology Office Note:  .   Date:  10/14/2022  ID:  [name], Natalie 28-Feb-1955, MRN [id number] PCP: Milus Height, PA  [facility] Providers Cardiologist:  Lance Muss, MD     History of Present Illness: [name]   [name] is a 67 y.o. female Discussed with the use of AI scribe   History of Present Illness   The patient is a 67 year old individual with a history of mitral valve replacement and tricuspid valve repair in 2014, complicated by a postoperative stroke. She also has a history of atrial flutter, for which she underwent cardioversion in 2014, and a history of uterine cancer. She is currently on Coumadin and amiodarone. The patient's most recent echocardiogram in 2023 showed an EF of 65% with a 31mm Soren mechanical mitral valve functioning normally.  The patient reports feeling well with no complaints of shortness of breath or chest pain. She has been managing her Coumadin levels and taking her amiodarone as prescribed. Her recent lab results show a hemoglobin of 11.2, creatinine of 1.0, and LDL of 182. She has been trying to manage her cholesterol levels and has seen a decrease in weight to 217 lbs. She is currently on Lipitor 40mg  daily for cholesterol management.  The patient is also under the care of another provider who has been monitoring her thyroid and liver levels due to the amiodarone. The patient reports satisfaction with her current treatment plan and does not express any concerns or issues.          ROS: No bleeding  Studies Reviewed: [name]   EKG Interpretation Date/Time:  Friday October 14 2022 16:11:01 EDT Ventricular Rate:  80 PR Interval:  208 QRS Duration:  154 QT Interval:  478 QTC Calculation: 551 R Axis:   -75  Text Interpretation: Normal sinus rhythm Left axis deviation Non-specific intra-ventricular conduction block Minimal voltage criteria for LVH, may be normal variant ( Cornell product ) Possible Lateral infarct , age undetermined When  compared with ECG of 18-Jan-2022 00:41, T wave inversion no longer evident in Lateral leads Confirmed by Donato Schultz ([id number]) on 10/14/2022 4:29:21 PM    Results LABS Hb: 11.2 Cr: 1.0 LDL: 182  DIAGNOSTIC Echocardiogram: EF 65%, 31 mm Sorin mechanical mitral valve functioning normally (08/24/2021)  Risk Assessment/Calculations:            Physical Exam:   VS:  BP 136/82   Pulse 70   Ht 5\' 6"  (1.676 m)   Wt 217 lb (98.4 kg)   SpO2 96%   BMI 35.02 kg/m    Wt Readings from Last 3 Encounters:  10/14/22 217 lb (98.4 kg)  04/07/22 223 lb 3.2 oz (101.2 kg)  02/02/22 218 lb (98.9 kg)    GEN: Well nourished, well developed in no acute distress NECK: No JVD; No carotid bruits CARDIAC: Sharp S1 click RRR, no murmurs, no rubs, no gallops RESPIRATORY:  Clear to auscultation without rales, wheezing or rhonchi  ABDOMEN: Soft, non-tender, non-distended EXTREMITIES:  No edema; No deformity   ASSESSMENT AND PLAN: .    Assessment and Plan    Mitral Valve Replacement and Tricuspid Valve Repair No symptoms of heart failure or chest pain. Echocardiogram from 08/24/2021 showed EF of 65% with a 31mm Soren mechanical mitral valve functioning normally. -Continue current management.  Atrial Flutter On Coumadin and Amiodarone. No new symptoms. -Continue current management.  Hyperlipidemia LDL 182 despite Lipitor 40mg  daily. Discussed potential switch to Crestor, but no changes made at this  time. -Continue Lipitor 40mg  daily.  General Health Maintenance -Continue monitoring thyroid function due to Amiodarone use. -Plan for routine blood work with primary care provider.           Signed, Donato Schultz, MD

## 2022-10-19 ENCOUNTER — Other Ambulatory Visit: Payer: Self-pay | Admitting: Physician Assistant

## 2022-10-19 DIAGNOSIS — Z1231 Encounter for screening mammogram for malignant neoplasm of breast: Secondary | ICD-10-CM

## 2022-10-24 NOTE — Telephone Encounter (Signed)
error 

## 2022-10-26 NOTE — Progress Notes (Signed)
Radiation Oncology         [phone] ________________________________  Name: [name] MRN: [id number]  Date: 10/27/2022  DOB: 08/09/1955  Follow-Up Visit Note  CC: [name], Altamease Oiler, PA  [name], [place], [place]  No diagnosis found.  Diagnosis:  Stage II endometrial cancer, high-grade   Interval Since Last Radiation: 2 years, 6 months, and 12 days   Radiation Treatment Dates: 03/18/2019 through 04/15/2019 Site Technique Total Dose (Gy) Dose per Fx (Gy) Completed Fx Beam Energies  Vagina: Pelvis HDR-brachy 30/30 6 5/5 Ir-192   Narrative:  The patient returns today for routine annual follow-up. She was last seen here for follow-up on 10/21/21.   Since her last visit, she presented to the ED and was admitted on 12/02/21 due to ongoing bleeding after a recent dental procedure. 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 She subsequently underwent a left frontotemporal craniotomy for evacuation of hematoma on 11/24. She was started on Keppra for seizure prophylaxis and cleared to start IV heparin on 11/27. She however developed a significant increase in aphasia, problems with ADLs, and an impaired ability to follow commands. 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 A repeat CT of the head showed no change in the mixed density left frontal parietal and temporal lobe subdural hematoma, and no change in the new small right temporal SDH She was cleared to resume Coumadin and was admitted to rehab from 12/10/21 through 12/16/21. (Also received speech therapy).   Of note: she also presented to the ED on 01/18/22 due to possible left sided facial drooping and a recent fall. MRI of the brain performed showed no  acute infarcts and the small 3 mm left and trace right sided subdural hematomas. No significant intracranial mass effect was appreciated. She had no persistent neurologic symptoms in the ED, however she tested positive for COVID and there was some concern for UTI. She was discharged with paxlovid and Keflex.     She also followed up with Dr. Pricilla Holm on 04/07/22. During which time, she denied any symptoms concerning for disease recurrence and she was noted as NED on examination.          Other imaging performed in the interval since her last visit includes a bilateral screening mammogram on 11/15/21 which showed no evidence of malignancy in either breast.                 ***  Allergies:  is allergic to chlorhexidine.  Meds: Current Outpatient Medications  Medication Sig Dispense Refill   acetaminophen (TYLENOL) 325 MG tablet Take 1-2 tablets (325-650 mg total) by mouth every 4 (four) hours as needed for mild pain.     amiodarone (PACERONE) 200 MG tablet TAKE 1 TABLET(200 MG) BY MOUTH DAILY 30 tablet 0   amLODipine (NORVASC) 10 MG tablet TAKE 1 TABLET EVERY DAY 90 tablet 0   atorvastatin (LIPITOR) 40 MG tablet TAKE 1 TABLET(40 MG) BY MOUTH DAILY (Patient taking differently: Take 40 mg by mouth daily.) 90 tablet 2   carvedilol (COREG) 12.5 MG tablet TAKE 1 TABLET TWICE DAILY WITH MEALS 180 tablet 0   cephALEXin (KEFLEX) 500 MG capsule Take 1 capsule (500 mg total) by mouth 3 (  three) times daily. 20 capsule 0   docusate sodium (COLACE) 100 MG capsule Take 1 capsule (100 mg total) by mouth 2 (two) times daily. 60 capsule 0   irbesartan (AVAPRO) 300 MG tablet TAKE 1 TABLET EVERY DAY 90 tablet 0   levETIRAcetam (KEPPRA) 500 MG tablet Take 1 tablet (500 mg total) by mouth 2 (two) times daily. 180 tablet 4   metFORMIN (GLUCOPHAGE) 500 MG tablet Take 0.5 tablets (250 mg total) by mouth 2 (two) times daily with a meal. 60 tablet 0   pantoprazole (PROTONIX) 40 MG tablet Take 1 tablet (40 mg total) by mouth  daily. 30 tablet 0   polyethylene glycol powder (GLYCOLAX/MIRALAX) 17 GM/SCOOP powder Take 17 g by mouth daily. 238 g 0   warfarin (COUMADIN) 4 MG tablet TAKE 1 AND 1/2 TABLETS with supper starting Friday. Appointment with coumadin clinic on Monday for further instructions. 180 tablet 1   No current facility-administered medications for this encounter.    Physical Findings: The patient is in no acute distress. Patient is alert and oriented.  vitals were not taken for this visit. .  No significant changes. Lungs are clear to auscultation bilaterally. Heart has regular rate and rhythm. No palpable cervical, supraclavicular, or axillary adenopathy. Abdomen soft, non-tender, normal bowel sounds.  On pelvic examination the external genitalia were unremarkable. A speculum exam was performed. There are no mucosal lesions noted in the vaginal vault. A Pap smear was obtained of the proximal vagina. On bimanual and rectovaginal examination there were no pelvic masses appreciated. ***   Lab Findings: Lab Results  Component Value Date   WBC 5.3 01/18/2022   HGB 11.2 (L) 01/18/2022   HCT 33.0 (L) 01/18/2022   MCV 98.1 01/18/2022   PLT 121 (L) 01/18/2022    Radiographic Findings: No results found.  Impression:  Stage II endometrial cancer, high-grade   The patient is recovering from the effects of radiation.  ***  Plan:  ***   *** minutes of total time was spent for this patient encounter, including preparation, face-to-face counseling with the patient and coordination of care, physical exam, and documentation of the encounter. ____________________________________  Billie Lade, PhD, MD  This document serves as a record of services personally performed by Antony Blackbird, MD. It was created on his behalf by Neena Rhymes, a trained medical scribe. The creation of this record is based on the scribe's personal observations and the provider's statements to them. This document has been checked  and approved by the attending provider.

## 2022-10-27 ENCOUNTER — Other Ambulatory Visit: Payer: Self-pay

## 2022-10-27 ENCOUNTER — Ambulatory Visit
Admission: RE | Admit: 2022-10-27 | Discharge: 2022-10-27 | Disposition: A | Discharge status: Home / Self Care | Payer: Medicare HMO | Source: Ambulatory Visit | Attending: Radiation Oncology | Admitting: Radiation Oncology

## 2022-10-27 ENCOUNTER — Encounter: Payer: Self-pay | Admitting: Radiation Oncology

## 2022-10-27 VITALS — BP 131/71 | HR 69 | Temp 97.3°F | Resp 18 | Ht 66.0 in | Wt 216.0 lb

## 2022-10-27 DIAGNOSIS — Z8542 Personal history of malignant neoplasm of other parts of uterus: Secondary | ICD-10-CM | POA: Diagnosis not present

## 2022-10-27 DIAGNOSIS — Z7984 Long term (current) use of oral hypoglycemic drugs: Secondary | ICD-10-CM | POA: Insufficient documentation

## 2022-10-27 DIAGNOSIS — Z923 Personal history of irradiation: Secondary | ICD-10-CM | POA: Diagnosis not present

## 2022-10-27 DIAGNOSIS — Z79899 Other long term (current) drug therapy: Secondary | ICD-10-CM | POA: Insufficient documentation

## 2022-10-27 DIAGNOSIS — C541 Malignant neoplasm of endometrium: Secondary | ICD-10-CM | POA: Diagnosis not present

## 2022-10-27 NOTE — Progress Notes (Signed)
EVVA DIN is here today for follow up post radiation to the pelvic.  They completed their radiation on: 04/15/19   Does the patient complain of any of the following:  Pain:No Abdominal bloating: No Diarrhea/Constipation: No Nausea/Vomiting: No Vaginal Discharge: No Blood in Urine or Stool: No Urinary Issues (dysuria/incomplete emptying/ incontinence/ increased frequency/urgency): No Does patient report using vaginal dilator 2-3 times a week and/or sexually active 2-3 weeks: Yes Post radiation skin changes: No   Additional comments if applicable:  BP 131/71 (BP Location: Left Arm, Patient Position: Sitting)   Pulse 69   Temp (!) 97.3 F (36.3 C) (Temporal)   Resp 18   Ht 5\' 6"  (1.676 m)   Wt 216 lb (98 kg)   SpO2 94%   BMI 34.86 kg/m

## 2022-11-01 ENCOUNTER — Ambulatory Visit: Payer: Medicare HMO | Admitting: Interventional Cardiology

## 2022-11-17 ENCOUNTER — Ambulatory Visit
Admission: RE | Admit: 2022-11-17 | Discharge: 2022-11-17 | Disposition: A | Discharge status: Home / Self Care | Payer: Medicare HMO | Source: Ambulatory Visit | Attending: Physician Assistant | Admitting: Physician Assistant

## 2022-11-17 DIAGNOSIS — Z1231 Encounter for screening mammogram for malignant neoplasm of breast: Secondary | ICD-10-CM

## 2022-11-18 ENCOUNTER — Ambulatory Visit: Payer: Medicare HMO | Attending: Cardiovascular Disease | Admitting: *Deleted

## 2022-11-18 DIAGNOSIS — Z5181 Encounter for therapeutic drug level monitoring: Secondary | ICD-10-CM | POA: Diagnosis not present

## 2022-11-18 DIAGNOSIS — I480 Paroxysmal atrial fibrillation: Secondary | ICD-10-CM

## 2022-11-18 DIAGNOSIS — Z954 Presence of other heart-valve replacement: Secondary | ICD-10-CM

## 2022-11-18 DIAGNOSIS — I059 Rheumatic mitral valve disease, unspecified: Secondary | ICD-10-CM | POA: Diagnosis not present

## 2022-11-18 LAB — POCT INR: INR: 2.6 (ref 2.0–3.0)

## 2022-11-18 NOTE — Patient Instructions (Signed)
Description   Continue taking warfarin 1.5 tablets daily except for 1 tablet on Tuesdays and Saturdays. Recheck INR in 6 weeks. [facility] [phone];  EAT GREENS 3 TIMES PER WEEK

## 2022-11-29 ENCOUNTER — Ambulatory Visit: Payer: Medicare HMO | Admitting: Nurse Practitioner

## 2022-12-10 ENCOUNTER — Other Ambulatory Visit: Payer: Self-pay | Admitting: Interventional Cardiology

## 2022-12-10 DIAGNOSIS — I10 Essential (primary) hypertension: Secondary | ICD-10-CM

## 2022-12-30 ENCOUNTER — Other Ambulatory Visit: Payer: Self-pay | Admitting: Physician Assistant

## 2022-12-30 ENCOUNTER — Ambulatory Visit: Payer: Medicare HMO | Attending: Cardiovascular Disease

## 2022-12-30 DIAGNOSIS — E1169 Type 2 diabetes mellitus with other specified complication: Secondary | ICD-10-CM | POA: Diagnosis not present

## 2022-12-30 DIAGNOSIS — Z79899 Other long term (current) drug therapy: Secondary | ICD-10-CM | POA: Diagnosis not present

## 2022-12-30 DIAGNOSIS — D6869 Other thrombophilia: Secondary | ICD-10-CM | POA: Diagnosis not present

## 2022-12-30 DIAGNOSIS — E7800 Pure hypercholesterolemia, unspecified: Secondary | ICD-10-CM | POA: Diagnosis not present

## 2022-12-30 DIAGNOSIS — E119 Type 2 diabetes mellitus without complications: Secondary | ICD-10-CM | POA: Diagnosis not present

## 2022-12-30 DIAGNOSIS — Z5181 Encounter for therapeutic drug level monitoring: Secondary | ICD-10-CM | POA: Diagnosis not present

## 2022-12-30 DIAGNOSIS — I110 Hypertensive heart disease with heart failure: Secondary | ICD-10-CM | POA: Diagnosis not present

## 2022-12-30 DIAGNOSIS — Z954 Presence of other heart-valve replacement: Secondary | ICD-10-CM | POA: Diagnosis not present

## 2022-12-30 DIAGNOSIS — I059 Rheumatic mitral valve disease, unspecified: Secondary | ICD-10-CM | POA: Diagnosis not present

## 2022-12-30 DIAGNOSIS — I480 Paroxysmal atrial fibrillation: Secondary | ICD-10-CM

## 2022-12-30 DIAGNOSIS — Z0000 Encounter for general adult medical examination without abnormal findings: Secondary | ICD-10-CM | POA: Diagnosis not present

## 2022-12-30 DIAGNOSIS — Z23 Encounter for immunization: Secondary | ICD-10-CM | POA: Diagnosis not present

## 2022-12-30 DIAGNOSIS — I5032 Chronic diastolic (congestive) heart failure: Secondary | ICD-10-CM | POA: Diagnosis not present

## 2022-12-30 DIAGNOSIS — Z7901 Long term (current) use of anticoagulants: Secondary | ICD-10-CM | POA: Diagnosis not present

## 2022-12-30 DIAGNOSIS — Z1231 Encounter for screening mammogram for malignant neoplasm of breast: Secondary | ICD-10-CM

## 2022-12-30 DIAGNOSIS — I4819 Other persistent atrial fibrillation: Secondary | ICD-10-CM | POA: Diagnosis not present

## 2022-12-30 LAB — POCT INR: INR: 2.6 (ref 2.0–3.0)

## 2022-12-30 NOTE — Patient Instructions (Signed)
Continue taking warfarin 1.5 tablets daily except for 1 tablet on Tuesdays and Saturdays. Recheck INR in 6 weeks. [facility] [phone];  EAT GREENS 3 TIMES PER WEEK

## 2023-02-07 ENCOUNTER — Telehealth: Payer: Medicare HMO | Admitting: Family Medicine

## 2023-02-10 ENCOUNTER — Ambulatory Visit: Payer: Medicare HMO | Attending: Cardiology

## 2023-02-10 DIAGNOSIS — Z5181 Encounter for therapeutic drug level monitoring: Secondary | ICD-10-CM | POA: Diagnosis not present

## 2023-02-10 DIAGNOSIS — I059 Rheumatic mitral valve disease, unspecified: Secondary | ICD-10-CM

## 2023-02-10 DIAGNOSIS — I480 Paroxysmal atrial fibrillation: Secondary | ICD-10-CM

## 2023-02-10 DIAGNOSIS — Z954 Presence of other heart-valve replacement: Secondary | ICD-10-CM

## 2023-02-10 LAB — POCT INR: INR: 2.6 (ref 2.0–3.0)

## 2023-02-10 NOTE — Patient Instructions (Signed)
 Continue taking warfarin 1.5 tablets daily except for 1 tablet on Tuesdays and Saturdays. Recheck INR in 6 weeks. [facility]in Clinic [phone];  EAT GREENS 3 TIMES PER WEEK

## 2023-02-23 ENCOUNTER — Telehealth: Payer: Self-pay

## 2023-02-23 ENCOUNTER — Telehealth: Payer: Self-pay | Admitting: *Deleted

## 2023-02-23 NOTE — Telephone Encounter (Signed)
CALLED PATIENT TO INFORM OF FU APPT. WITH DR. Pricilla Holm ON 05-12-23- ARRIVAL TIME- 2:15 PM, SPOKE WITH PATIENT'S DAUGHTER TOMEKA AND SHE IS AWARE OF THIS APPT.

## 2023-02-23 NOTE — Telephone Encounter (Signed)
Talbert Forest from radiation called and scheduled the patient for a follow up appt with Dr Pricilla Holm on 05/12/2023. Appt scheduled and Talbert Forest will contact the patient with the appt date/time.

## 2023-03-17 ENCOUNTER — Other Ambulatory Visit: Payer: Self-pay | Admitting: Diagnostic Neuroimaging

## 2023-03-24 ENCOUNTER — Ambulatory Visit: Payer: Medicare HMO | Attending: Cardiovascular Disease

## 2023-03-24 DIAGNOSIS — I059 Rheumatic mitral valve disease, unspecified: Secondary | ICD-10-CM | POA: Diagnosis not present

## 2023-03-24 DIAGNOSIS — Z954 Presence of other heart-valve replacement: Secondary | ICD-10-CM

## 2023-03-24 DIAGNOSIS — I480 Paroxysmal atrial fibrillation: Secondary | ICD-10-CM

## 2023-03-24 DIAGNOSIS — Z5181 Encounter for therapeutic drug level monitoring: Secondary | ICD-10-CM | POA: Diagnosis not present

## 2023-03-24 LAB — POCT INR: INR: 2.4 (ref 2.0–3.0)

## 2023-03-24 NOTE — Patient Instructions (Signed)
 Take 2 tablets tonight only then Continue taking warfarin 1.5 tablets daily except for 1 tablet on Tuesdays and Saturdays. Recheck INR in 6 weeks. [facility] [phone];  EAT GREENS 3 TIMES PER WEEK

## 2023-04-21 ENCOUNTER — Encounter: Payer: Self-pay | Admitting: *Deleted

## 2023-04-21 ENCOUNTER — Telehealth: Payer: Self-pay | Admitting: *Deleted

## 2023-04-21 NOTE — Telephone Encounter (Signed)
 Several attempted made the past two weeks to reach the patient to move appt on 05/12/2023. Voicemail for the home number is full and multiple messages left on cell phone. My chart message also sent. Letter mailed today

## 2023-05-02 ENCOUNTER — Telehealth: Payer: Self-pay | Admitting: *Deleted

## 2023-05-02 NOTE — Telephone Encounter (Signed)
 Patient called the office back and scheduled from 5/2 to 5/6. Patient aware with new date/time

## 2023-05-04 ENCOUNTER — Ambulatory Visit: Attending: Interventional Cardiology | Admitting: *Deleted

## 2023-05-04 DIAGNOSIS — I480 Paroxysmal atrial fibrillation: Secondary | ICD-10-CM | POA: Diagnosis not present

## 2023-05-04 DIAGNOSIS — Z5181 Encounter for therapeutic drug level monitoring: Secondary | ICD-10-CM

## 2023-05-04 DIAGNOSIS — I059 Rheumatic mitral valve disease, unspecified: Secondary | ICD-10-CM | POA: Diagnosis not present

## 2023-05-04 DIAGNOSIS — Z954 Presence of other heart-valve replacement: Secondary | ICD-10-CM | POA: Diagnosis not present

## 2023-05-04 LAB — POCT INR: INR: 1.9 — AB (ref 2.0–3.0)

## 2023-05-04 NOTE — Patient Instructions (Signed)
 Description   Take 2 tablets tonight then START taking warfarin 1.5 tablets daily except for 1 tablet on Saturdays. Recheck INR in 4 weeks. Cou[facility]in  Cl[facility] [phone];  EAT GREENS 3 TIMES PER WEEK

## 2023-05-12 ENCOUNTER — Ambulatory Visit: Payer: Medicare HMO | Admitting: Gynecologic Oncology

## 2023-05-15 NOTE — Progress Notes (Unsigned)
 Gynecologic Oncology Return Clinic Visit  05/16/2023  Reason for Visit: Follow-up in the setting of high risk uterine cancer   Treatment History: Oncology History Overview Note  Clear cell features MSI stable   Endometrial cancer (HCC)  09/24/2018 Initial Diagnosis   [name] presented with post menopausal bleeding   11/20/2018 Initial Biopsy   EMB 11/10: G3 carcinoma with clear cell features.  Pap 11/10: AGC    12/03/2018 Surgery   TRH/BSO, bil SLNs   12/05/2018 Imaging   CT C/A/P:  1. Low attenuation mass or fluid in the endometrial cavity (series 2, image 110), in keeping with known endometrial malignancy. 2. Numerous small bilateral ground-glass pulmonary nodules. Although nonspecific these are likely infectious or inflammatory given appearance, isolated manifestation of metastatic disease much less favored. 3. Enlarged mediastinal and hilar lymph nodes, likely reactive given pulmonary findings. 4. No evidence of metastatic disease in the abdomen or pelvis. 5. Nonobstructive right nephrolithiasis. 6. Aortic Atherosclerosis (ICD10-I70.0).   01/02/2019 Pathologic Stage   Stage II Gr3 (clear cell features), no LVSI, cervial stroma involved, <50% MI MSI-low  A. 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            Tumor limited to upper half of myometrium.            Leiomyoma.            See oncology table.       Serosa: Fibrous adhesions with endosalpingiosis. No malignancy.  - Cervix: Stroma involved by tumor.  - Bilateral ovaries: Inclusion cysts. No malignancy.  - Bilateral fallopian tubes: Unremarkable. No malignancy.   B. LYMPH NODE, RIGHT OBTURATOR, SENTINEL, BIOPSY:  - One of one lymph nodes negative for carcinoma (0/1).   C. LYMPH NODE, LEFT EXTERNAL ILIAC, SENTINEL, BIOPSY:  - One of one lymph nodes negative for carcinoma  (0/1).   D. LYMPH NODE, LEFT EXTERNAL ILIAC, BIOPSY:  - One of one lymph nodes negative for carcinoma (0/1).   ONCOLOGY TABLE:   UTERUS, CARCINOMA OR CARCINOSARCOMA   Procedure: Total hysterectomy and bilateral salpingo-oophorectomy  Histologic type: Poorly differentiated carcinoma with clear cell  features, see comment.  Histologic Grade: High-grade  Myometrial invasion:       Depth of invasion:9 mm       Myometrial thickness: 20 mm  Uterine Serosa Involvement: Not identified  Cervical stromal involvement: Present  Extent of involvement of other organs: Not identified  Lymphovascular invasion: Not identified  Regional Lymph Nodes:       Examined:     3 Sentinel                               0 non-sentinel                               3 total        Lymph nodes with metastasis: 0        Isolated tumor cells (<0.2 mm): 0        Micrometastasis:  (>0.2 mm and < 2.0 mm): 0        Macrometastasis: (>2.0 mm): 0        Extracapsular extension: Not applicable  Representative Tumor Block: A5  MMR / MSI testing: Will be ordered  Pathologic Stage Classification (pTNM, AJCC 8th edition):  pT2, pNX  Comments: The tumor consists of solid sheets and more tubulocystic areas with focal areas of cytoplasmic clearing. Immunohistochemistry is positive for racemase (weak), Napsin-A (focal), and p53 (wild type). ER and PR are negative. While extensive clear cell changes are not seen in the more solid components, the immunoprofile along with more definitive areas of clear cell changes favor the overall tumor to be a clear cell carcinoma. The tumor has a more pushing type of invasion. There is stromal invasion in areas of endocervical mucosa. Immunohistochemistry on the serosa adhesions reveals mesothelial cells (calretinin) and a small focus of endosalpingiosis (PAX-8, ER, MOC31). Pancytokeratin on the lymph nodes is negative.    01/18/2019 Cancer Staging   Staging form: Corpus Uteri - Carcinoma and  Carcinosarcoma, AJCC 8th Edition - Pathologic: Stage II (pT2, pN0, cM0) - Signed by Almeda Jacobs, MD on 01/18/2019   02/04/2019 - 05/21/2019 Chemotherapy   The patient had carboplatin  and taxol    03/18/2019 - 04/15/2019 Radiation Therapy    Vagina: Pelvis HDR-brachy 30/30 6 5/5 Ir-192      06/20/2019 Imaging   IMPRESSION: 1. Interval hysterectomy. No abdominopelvic adenopathy or evidence of metastatic disease. 2. Relatively similar appearance of the chest since 12/05/2018. Mosaic attenuation throughout the lungs which could be related to air trapping or heterogeneous ground-glass. Given cardiomegaly and extensive postsurgical changes of mitral and tricuspid valve repair, ground-glass as can be seen with mild pulmonary edema is considered. 3. Similar thoracic adenopathy, favored to be reactive or related to a component of congestive heart failure. 4. Increased size of small left axillary nodes, possibly related to recent COVID-19 vaccine. Correlate with clinical history. 5. A subtle right upper lobe pulmonary nodule is felt to be similar in size but slightly more distinct in appearance today. Recommend attention on follow-up. 6. Right nephrolithiasis. 7. Pulmonary artery enlargement suggests pulmonary arterial hypertension. 8. Cholelithiasis.    [name] was last seen by Dr. Eloise Hake in 10/2022. Last mammogram was in November 2024.  Interval History: [name] presents today for continued follow up. [name] overall reports doing well. [name] continues to have a close relationship with her family. Her grandchildren helped her tremendously when [name] was ill. Tolerating diet with no nausea, emesis, or appetite changes. Bladder and bowels functioning without difficulty. No abdominal or pelvic pain reported. No vaginal bleeding or discharge reported. No lower extremity pain or swelling reported. No concerning symptoms for recurrence.   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 [name]  developed aphasia and findings concerning for seizures. [name] was initially in inpatient rehab. This was a challenging time for her and her family helped her through this.   Past Medical/Surgical History: Past Medical History:  Diagnosis Date   Abnormal chest CT    Anemia    Aortic atherosclerosis (HCC)    Asthma    Atrial enlargement, left    severe   Atrial fibrillation (HCC) 01/05/2011   Chronic persistent, failed DCCV    Atrial flutter (HCC)    Bronchospasm 1998   Cardiomegaly    Carotid bruit    Chronic diastolic congestive heart failure (HCC)    Diabetes mellitus    Type II   Ejection fraction < 50%    35-40%,    Heart murmur    History of blood transfusion    History of radiation therapy 03/18/19-04/15/19   endometrial - vaginal brachytherapy -  Dr. Eloise Hake    Hypercholesterolemia    Hypertension  Junctional rhythm 09/14/2018   Noted on EKG   Left bundle branch block (LBBB) 09/14/2018   Noted on EKG   LVH (left ventricular hypertrophy) 10/10/2016   Moderate, Noted on ECHO   Mitral regurgitation    Obesity    Obesity (BMI 30-39.9) 01/16/2012   Persistent atrial fibrillation (HCC)    Polyp of rectum    Rheumatic fever 01/16/2012   Reported during childhood   S/P Maze operation for atrial fibrillation 02/15/2012   Complete biatrial lesion set using bipolar radiofrequency and cryothermy ablation with clipping of LA appendage   S/P mitral valve replacement with metallic valve 02/15/2012   31mm Sorin Carbomedics Optiform mechanical prosthesis   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   28mm Edwards mc3 ring annuloplasty   Shortness of breath    with exertion   Sleep apnea    DOES NOT HAVE CPAP   Stroke (HCC) 2014   Post op , left arm weakness   Tricuspid regurgitation 01/16/2012   Uterine cancer [facility])     Past Surgical History:  Procedure Laterality Date   CARDIAC CATHETERIZATION  >5 years   CARDIOVASCULAR STRESS TEST  09/2010   CARDIOVERSION  03/25/2011    Procedure: CARDIOVERSION;  Surgeon: Lucendia Rusk, MD;  Location: [facility] OR;  Service: Cardiovascular;  Laterality: N/A;   CARDIOVERSION N/A 07/27/2012   Procedure: CARDIOVERSION;  Surgeon: Lucendia Rusk, MD;  Location: [facility] ENDOSCOPY;  Service: Cardiovascular;  Laterality: N/A;   CESAREAN SECTION     COLONOSCOPY WITH PROPOFOL  N/A 04/27/2016   Procedure: COLONOSCOPY WITH PROPOFOL ;  Surgeon: Baldo Bonds, MD;  Location: [facility] ENDOSCOPY;  Service: Endoscopy;  Laterality: N/A;   CRANIOTOMY Left 12/03/2021   Procedure: FRONTOTEMPORAL CRANIOTOMY FOR EVACUATION SUBDURAL HEMATOMA;  Surgeon: Pincus Bridgeman, DO;  Location: [facility] OR;  Service: Neurosurgery;  Laterality: Left;   INTRAOPERATIVE TRANSESOPHAGEAL ECHOCARDIOGRAM  02/15/2012   Procedure: INTRAOPERATIVE TRANSESOPHAGEAL ECHOCARDIOGRAM;  Surgeon: Gardenia Jump, MD;  Location: [facility] OR;  Service: Open Heart Surgery;  Laterality: N/A;   MAZE  02/15/2012   Procedure: MAZE;  Surgeon: Gardenia Jump, MD;  Location: [facility] OR;  Service: Open Heart Surgery;  Laterality: N/A;   MITRAL VALVE REPLACEMENT  02/15/2012   Procedure: MITRAL VALVE (MV) REPLACEMENT;  Surgeon: Gardenia Jump, MD;  Location: [facility] OR;  Service: Open Heart Surgery;  Laterality: N/A;   ROBOTIC ASSISTED LAPAROSCOPIC HYSTERECTOMY AND SALPINGECTOMY Bilateral 01/02/2019   Procedure: XI ROBOTIC ASSISTED LAPAROSCOPIC TOTAL HYSTERECTOMY WITH BILATERAL SALPINGOOPHORECTOMY, SENTINEL LYMPH NODE BIOPSY;  Surgeon: Suzi Essex, MD;  Location: [facility] ORS;  Service: Gynecology;  Laterality: Bilateral;   TEE WITHOUT CARDIOVERSION  12/21/2011   Procedure: TRANSESOPHAGEAL ECHOCARDIOGRAM (TEE);  Surgeon: Lucendia Rusk, MD;  Location: [facility] ENDOSCOPY;  Service: Cardiovascular;  Laterality: N/A;   TOOTH EXTRACTION N/A 11/19/2021   Procedure: DENTAL EXTRACTION TEETH NUMBER 14, 19, 30, 31;  Surgeon: Ascencion Lava, DMD;  Location: [facility] OR;  Service: Oral Surgery;  Laterality: N/A;   TRICUSPID VALVE REPLACEMENT   02/15/2012   Procedure: TRICUSPID VALVE REPAIR;  Surgeon: Gardenia Jump, MD;  Location: [facility] OR;  Service: Open Heart Surgery;  Laterality: N/A;   TUBAL LIGATION     at time of her c-section    Family History  Problem Relation Age of Onset   Asthma Mother    Diabetes Mother    Hypertension Mother    Hypertension Brother    Heart attack Neg Hx    Stroke Neg Hx    Colon  cancer Neg Hx    Colon polyps Neg Hx    Liver disease Neg Hx    Uterine cancer Neg Hx    Ovarian cancer Neg Hx    Breast cancer Neg Hx     Social History   Socioeconomic History   Marital status: Married    Spouse name: Not on file   Number of children: Not on file   Years of education: Not on file   Highest education level: Not on file  Occupational History   Occupation: Insurance account manager    Employer: [employer]  Tobacco Use   Smoking status: Never   Smokeless tobacco: Never  Vaping Use   Vaping status: Never Used  Substance and Sexual Activity   Alcohol use: No   Drug use: No   Sexual activity: Yes    Birth control/protection: Post-menopausal  Other Topics Concern   Not on file  Social History Narrative   Pt lives in [place] with spouse.  Works at [employer]. 2 grown children, 1 grandchild   Social Drivers of Corporate investment banker Strain: Not on [employer] Insecurity: Not on file  Transportation Needs: Patient Declined (12/02/2021)   PRAPARE - Administrator, Civil Service (Medical): Patient declined    Lack of Transportation (Non-Medical): Patient declined  Physical Activity: Not on file  Stress: Not on file  Social Connections: Not on file    Current Medications:  Current Outpatient Medications:    acetaminophen  (TYLENOL ) 325 MG tablet, Take 1-2 tablets (325-650 mg total) by mouth every 4 (four) hours as needed for mild pain., Disp: , Rfl:    amiodarone  (PACERONE ) 200 MG tablet, TAKE 1 TABLET(200 MG) BY MOUTH DAILY, Disp: 30 tablet, Rfl: 0    amLODipine  (NORVASC ) 10 MG tablet, TAKE 1 TABLET EVERY DAY (MUST KEEP UPCOMING APPOINTMENT WITH CARDIOLOGIST IN NOVEMBER 2024 BEFORE MORE REFILLS), Disp: 90 tablet, Rfl: 3   atorvastatin  (LIPITOR ) 40 MG tablet, TAKE 1 TABLET(40 MG) BY MOUTH DAILY (Patient taking differently: Take 40 mg by mouth daily.), Disp: 90 tablet, Rfl: 2   carvedilol  (COREG ) 12.5 MG tablet, TAKE 1 TABLET TWICE DAILY WITH MEALS (MUST KEEP UPCOMING APPOINTMENT WITH CARDIOLOGIST IN NOVEMBER 2024 BEFORE MORE REFILLS), Disp: 180 tablet, Rfl: 3   docusate sodium  (COLACE) 100 MG capsule, Take 1 capsule (100 mg total) by mouth 2 (two) times daily., Disp: 60 capsule, Rfl: 0   irbesartan  (AVAPRO ) 300 MG tablet, TAKE 1 TABLET EVERY DAY (MUST KEEP UPCOMING APPOINTMENT WITH CARDIOLOGIST IN NOVEMBER 2024 BEFORE MORE REFILLS), Disp: 90 tablet, Rfl: 3   levETIRAcetam  (KEPPRA ) 500 MG tablet, TAKE 1 TABLET(500 MG) BY MOUTH TWICE DAILY, Disp: 180 tablet, Rfl: 0   metFORMIN  (GLUCOPHAGE ) 500 MG tablet, Take 0.5 tablets (250 mg total) by mouth 2 (two) times daily with a meal., Disp: 60 tablet, Rfl: 0   polyethylene glycol powder (GLYCOLAX /MIRALAX ) 17 GM/SCOOP powder, Take 17 g by mouth daily., Disp: 238 g, Rfl: 0   warfarin (COUMADIN ) 4 MG tablet, TAKE 1 AND 1/2 TABLETS with supper starting Friday. Appointment with coumadin  clinic on Monday for further instructions., Disp: 180 tablet, Rfl: 1  Review of Systems: On ROS intake form, + for shortness of breath (with longer exertion per pt), back pain (chronic issue) Denies appetite changes, fevers, chills, fatigue, unexplained weight changes. Denies hearing loss, neck lumps or masses, mouth sores, ringing in ears or voice changes. Denies cough or wheezing.   Denies chest pain or palpitations.  Denies leg swelling. Denies abdominal distention, pain, blood in stools, constipation, diarrhea, nausea, vomiting, or early satiety. Denies pain with intercourse, dysuria, frequency, hematuria or  incontinence. Denies hot flashes, pelvic pain, vaginal bleeding or vaginal discharge.   Denies joint pain or muscle pain/cramps. Denies itching, rash, or wounds. Denies dizziness, headaches, numbness or seizures. Denies swollen lymph nodes or glands, denies easy bruising or bleeding. Denies anxiety, depression, confusion, or decreased concentration.  Physical Exam: BP 127/66 (BP Location: Right Arm, Patient Position: Sitting)   Pulse 85   Temp 98.8 F (37.1 C) (Oral)   Resp 16   Ht 5\' 6"  (1.676 m)   Wt 213 lb (96.6 kg)   SpO2 94%   BMI 34.38 kg/m  General: Alert, oriented, no acute distress. HEENT: Normocephalic, atraumatic, sclera anicteric. Chest: Clear to auscultation bilaterally.  No wheezes or rhonchi. Cardiovascular: Occasional irregular beats, irregularly irreg (normal per pt, hx a fib), no murmurs. Abdomen: Obese, soft, nontender.  Normoactive bowel sounds.  No masses or hepatosplenomegaly appreciated.  Well-healed incisions without nodularity. Extremities: Grossly normal range of motion.  Warm, well perfused.  No edema bilaterally. Skin: No rashes or lesions noted. Lymphatics: No cervical, supraclavicular, or inguinal adenopathy. GU: Normal appearing external genitalia without erythema, excoriation, or lesions.  Speculum exam reveals moderately atrophic vaginal mucosa, with radiation changes evident.  No bleeding or discharge no masses noted.  Bimanual exam reveals no nodularity or masses. Rectovaginal exam confirms findings.  Laboratory & Radiologic Studies: None new  Assessment & Plan: [name] is a 68 y.o. 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 Adjuvant platinum-based chemotherapy and vaginal brachytherapy completed in 05/2019.   [name] continues to be NED on exam today.  [name] is overall doing quite well without any lasting sequelae from treatment. [name] has also overall recovered from her subdural hematomas.   Per NCCN and  SGO surveillance recommendations, in the setting of high risk endometrial cancer, we will continue with surveillance every 6 months. [name] is seeing Dr Eloise Hake in October.  I have asked her to call back after the new year to schedule a visit to see Dr. Orvil Bland in May 2026.    We discussed the signs and symptoms that would be concerning for disease recurrence and should prompt a phone call prior to her next visit.    20 minutes of total time was spent for this patient encounter, including preparation, face-to-face counseling with the patient and coordination of care, and documentation of the encounter.  Vira Grieves NP [facility] GYN Oncology

## 2023-05-15 NOTE — Patient Instructions (Signed)
 It was good to see you today.  I do not see or feel any evidence of cancer recurrence on your exam.   I will see you for follow-up in 12 months. Please call the clinic at [phone] after your visit with Dr. Eloise Hake in six months or have his office call to schedule your visit to see Dr. Orvil Bland in May 2026.   As always, if you develop any new and concerning symptoms before your next visit, please call to see me sooner.  Symptoms to report to your health care team include vaginal bleeding, rectal bleeding, bloating, weight loss without effort, new and persistent pain, new and  persistent fatigue, new leg swelling, new masses (i.e., bumps in your neck or groin), new and persistent cough, new and persistent nausea and vomiting, change in bowel or bladder habits, and any other concerns.

## 2023-05-16 ENCOUNTER — Inpatient Hospital Stay: Attending: Gynecologic Oncology | Admitting: Gynecologic Oncology

## 2023-05-16 VITALS — BP 127/66 | HR 85 | Temp 98.8°F | Resp 16 | Ht 66.0 in | Wt 213.0 lb

## 2023-05-16 DIAGNOSIS — Z90722 Acquired absence of ovaries, bilateral: Secondary | ICD-10-CM | POA: Diagnosis not present

## 2023-05-16 DIAGNOSIS — Z9221 Personal history of antineoplastic chemotherapy: Secondary | ICD-10-CM | POA: Diagnosis not present

## 2023-05-16 DIAGNOSIS — Z923 Personal history of irradiation: Secondary | ICD-10-CM | POA: Insufficient documentation

## 2023-05-16 DIAGNOSIS — Z08 Encounter for follow-up examination after completed treatment for malignant neoplasm: Secondary | ICD-10-CM | POA: Insufficient documentation

## 2023-05-16 DIAGNOSIS — Z90710 Acquired absence of both cervix and uterus: Secondary | ICD-10-CM | POA: Diagnosis not present

## 2023-05-16 DIAGNOSIS — Z8542 Personal history of malignant neoplasm of other parts of uterus: Secondary | ICD-10-CM | POA: Diagnosis present

## 2023-05-16 DIAGNOSIS — Z9079 Acquired absence of other genital organ(s): Secondary | ICD-10-CM | POA: Diagnosis not present

## 2023-05-16 DIAGNOSIS — C541 Malignant neoplasm of endometrium: Secondary | ICD-10-CM

## 2023-05-16 NOTE — Addendum Note (Signed)
 Addended by: Vira Grieves D on: 05/16/2023 02:32 PM   Modules accepted: Level of Service

## 2023-06-02 ENCOUNTER — Ambulatory Visit: Attending: Interventional Cardiology | Admitting: *Deleted

## 2023-06-02 DIAGNOSIS — Z954 Presence of other heart-valve replacement: Secondary | ICD-10-CM | POA: Diagnosis not present

## 2023-06-02 DIAGNOSIS — Z5181 Encounter for therapeutic drug level monitoring: Secondary | ICD-10-CM

## 2023-06-02 DIAGNOSIS — I480 Paroxysmal atrial fibrillation: Secondary | ICD-10-CM | POA: Diagnosis not present

## 2023-06-02 DIAGNOSIS — I059 Rheumatic mitral valve disease, unspecified: Secondary | ICD-10-CM

## 2023-06-02 LAB — POCT INR: INR: 2.4 (ref 2.0–3.0)

## 2023-06-02 NOTE — Patient Instructions (Addendum)
 Description   Take 2 tablets tonight then START taking warfarin 1.5 tablets daily. Recheck INR in 3 weeks. Coumadin  Clinic [phone];  EAT GREENS 3 TIMES PER WEEK

## 2023-06-12 ENCOUNTER — Other Ambulatory Visit: Payer: Self-pay | Admitting: Diagnostic Neuroimaging

## 2023-06-23 ENCOUNTER — Ambulatory Visit: Attending: Interventional Cardiology

## 2023-06-23 DIAGNOSIS — Z5181 Encounter for therapeutic drug level monitoring: Secondary | ICD-10-CM

## 2023-06-23 DIAGNOSIS — I059 Rheumatic mitral valve disease, unspecified: Secondary | ICD-10-CM | POA: Diagnosis not present

## 2023-06-23 DIAGNOSIS — Z954 Presence of other heart-valve replacement: Secondary | ICD-10-CM

## 2023-06-23 DIAGNOSIS — I480 Paroxysmal atrial fibrillation: Secondary | ICD-10-CM

## 2023-06-23 LAB — POCT INR: INR: 2.2 (ref 2.0–3.0)

## 2023-06-23 NOTE — Patient Instructions (Signed)
 Take 2 tablets tonight then continue taking warfarin 1.5 tablets daily. Recheck INR in 4 weeks. Coumadin  Clinic [phone];  EAT GREENS 3 TIMES PER WEEK

## 2023-07-21 ENCOUNTER — Ambulatory Visit: Attending: Interventional Cardiology

## 2023-07-21 DIAGNOSIS — Z5181 Encounter for therapeutic drug level monitoring: Secondary | ICD-10-CM

## 2023-07-21 DIAGNOSIS — Z954 Presence of other heart-valve replacement: Secondary | ICD-10-CM

## 2023-07-21 DIAGNOSIS — I059 Rheumatic mitral valve disease, unspecified: Secondary | ICD-10-CM | POA: Diagnosis not present

## 2023-07-21 DIAGNOSIS — I480 Paroxysmal atrial fibrillation: Secondary | ICD-10-CM | POA: Diagnosis not present

## 2023-07-21 LAB — POCT INR: INR: 4.7 — AB (ref 2.0–3.0)

## 2023-07-21 NOTE — Progress Notes (Signed)
Please see anticoagulation encounter.

## 2023-07-21 NOTE — Patient Instructions (Signed)
 Description   Skip today's dosage of Warfarin then continue taking warfarin 1.5 tablets daily. Recheck INR in 2 weeks. Coumadin  Clinic [phone];  EAT GREENS 3 TIMES PER WEEK

## 2023-08-04 ENCOUNTER — Ambulatory Visit: Attending: Interventional Cardiology

## 2023-08-04 ENCOUNTER — Telehealth: Payer: Self-pay | Admitting: Diagnostic Neuroimaging

## 2023-08-04 DIAGNOSIS — Z954 Presence of other heart-valve replacement: Secondary | ICD-10-CM | POA: Diagnosis not present

## 2023-08-04 DIAGNOSIS — Z5181 Encounter for therapeutic drug level monitoring: Secondary | ICD-10-CM | POA: Diagnosis not present

## 2023-08-04 DIAGNOSIS — I480 Paroxysmal atrial fibrillation: Secondary | ICD-10-CM

## 2023-08-04 LAB — POCT INR: INR: 5.5 — AB (ref 2.0–3.0)

## 2023-08-04 NOTE — Patient Instructions (Signed)
 Description   HOLD today's dose and tomorrow's dose and then START taking warfarin 1.5 tablets daily EXCEPT 1 tablet on Sundays and Thursdays.  EAT GREENS 3 TIMES PER WEEK Recheck INR in 1 week.  Coumadin  Clinic [phone]

## 2023-08-04 NOTE — Progress Notes (Signed)
 INR 5.5; Please see anticoagulation encounter

## 2023-08-04 NOTE — Telephone Encounter (Signed)
 Pt called for her refill on her levETIRAcetam  (KEPPRA ) 500 MG tablet and is needing it to be sent to the [facility] on [address] has scheduled her yearly f/u

## 2023-08-07 MED ORDER — LEVETIRACETAM 500 MG PO TABS: 500.0000 mg | ORAL_TABLET | Freq: Two times a day (BID) | ORAL | 1 refills | Status: DC

## 2023-08-07 NOTE — Telephone Encounter (Signed)
 Refill for levetiracetam  sent with note of no further refills if appt on 09/28/23 is not kept.

## 2023-08-11 ENCOUNTER — Ambulatory Visit: Attending: Interventional Cardiology

## 2023-08-11 DIAGNOSIS — Z5181 Encounter for therapeutic drug level monitoring: Secondary | ICD-10-CM | POA: Diagnosis not present

## 2023-08-11 DIAGNOSIS — I059 Rheumatic mitral valve disease, unspecified: Secondary | ICD-10-CM

## 2023-08-11 DIAGNOSIS — Z954 Presence of other heart-valve replacement: Secondary | ICD-10-CM

## 2023-08-11 DIAGNOSIS — I480 Paroxysmal atrial fibrillation: Secondary | ICD-10-CM | POA: Diagnosis not present

## 2023-08-11 LAB — POCT INR: INR: 2.4 (ref 2.0–3.0)

## 2023-08-11 NOTE — Patient Instructions (Signed)
 Take 2 tablets today only then continue taking warfarin 1.5 tablets daily EXCEPT 1 tablet on Sundays and Thursdays.  EAT GREENS 2 TIMES PER WEEK Recheck INR in 3 weeks.  Coumadin  Clinic [phone]

## 2023-08-11 NOTE — Progress Notes (Signed)
 INR-2.4 Please see anticoagulation encounter.

## 2023-09-01 ENCOUNTER — Ambulatory Visit: Attending: Interventional Cardiology

## 2023-09-01 DIAGNOSIS — Z954 Presence of other heart-valve replacement: Secondary | ICD-10-CM | POA: Diagnosis not present

## 2023-09-01 DIAGNOSIS — Z5181 Encounter for therapeutic drug level monitoring: Secondary | ICD-10-CM

## 2023-09-01 DIAGNOSIS — I059 Rheumatic mitral valve disease, unspecified: Secondary | ICD-10-CM | POA: Diagnosis not present

## 2023-09-01 DIAGNOSIS — I480 Paroxysmal atrial fibrillation: Secondary | ICD-10-CM | POA: Diagnosis not present

## 2023-09-01 LAB — POCT INR: INR: 5 — AB (ref 2.0–3.0)

## 2023-09-01 NOTE — Progress Notes (Signed)
 INR 5.0 Please see anticoagulation encounter  Hold today only and eat greens then continue taking warfarin 1.5 tablets daily EXCEPT 1 tablet on Sundays and Thursdays.  EAT GREENS 2 TIMES PER WEEK Recheck INR in 2 weeks.  Coumadin  Clinic [phone]

## 2023-09-01 NOTE — Patient Instructions (Signed)
 Hold today only and eat greens then continue taking warfarin 1.5 tablets daily EXCEPT 1 tablet on Sundays and Thursdays.  EAT GREENS 2 TIMES PER WEEK Recheck INR in 2 weeks.  Coumadin  Clinic [phone]

## 2023-09-15 ENCOUNTER — Ambulatory Visit: Attending: Interventional Cardiology | Admitting: Pharmacist

## 2023-09-15 DIAGNOSIS — I349 Nonrheumatic mitral valve disorder, unspecified: Secondary | ICD-10-CM | POA: Diagnosis not present

## 2023-09-15 DIAGNOSIS — I059 Rheumatic mitral valve disease, unspecified: Secondary | ICD-10-CM

## 2023-09-15 DIAGNOSIS — I480 Paroxysmal atrial fibrillation: Secondary | ICD-10-CM | POA: Diagnosis not present

## 2023-09-15 DIAGNOSIS — Z954 Presence of other heart-valve replacement: Secondary | ICD-10-CM | POA: Diagnosis not present

## 2023-09-15 DIAGNOSIS — Z5181 Encounter for therapeutic drug level monitoring: Secondary | ICD-10-CM | POA: Diagnosis not present

## 2023-09-15 LAB — POCT INR: INR: 2.8 (ref 2.0–3.0)

## 2023-09-15 NOTE — Patient Instructions (Addendum)
 Description   Continue taking warfarin 1.5 tablets daily EXCEPT 1 tablet on Sundays and Thursdays.  EAT GREENS 2 TIMES PER WEEK Recheck INR in 3 weeks.  Coumadin  Clinic [phone]

## 2023-09-15 NOTE — Progress Notes (Signed)
 INR 2.8  Please see anticoagulation encounter   Description   Continue taking warfarin 1.5 tablets daily EXCEPT 1 tablet on Sundays and Thursdays.  EAT GREENS 2 TIMES PER WEEK Recheck INR in 3 weeks.  Coumadin  Clinic [phone]

## 2023-09-27 NOTE — Progress Notes (Unsigned)
 [facility]  PATIENT: [name] DOB: Jul 08, 1955  REFERRING CLINICIAN: [name], [name], PA HISTORY FROM: patient REASON FOR VISIT: Symptomatic seizures    HISTORICAL  CHIEF COMPLAINT:  Chief Complaint  Patient presents with   Seizures    Rm 3 alone Pt is well and stable. Reports no sz or new concerns since last visit.     HISTORY OF PRESENT ILLNESS:    Update 09/28/2023 JM: Patient returns for overdue follow-up visit.  Reports she has been doing well since prior visit without any reoccurring seizure activity and remains compliant on Keppra  500 mg twice daily without side effects.  She is requesting a refill for Keppra .  She continues to drive and maintains ADLs and IADLs independently.  Reports routine follow-up with PCP as well as cardiology.  No questions or concerns at this time.    Consult visit 02/02/2022 Dr. Margaret: 68 year old female here for evaluation of subdural hematoma and seizure risk.  Patient presented to hospital in November 2023 after having excessive bleeding status post dental procedure and resuming anticoagulation.  She was also having some somnolence and confusion.  She went to the hospital for evaluation.  She was found to have left greater than right subdural hematomas.  These were treated surgically.  She was also having some episodes of aphasia which are concerning for seizure.  She had abnormal EEG and therefore levetiracetam  was started.  She was stabilized transferred to inpatient rehabilitation.  Now she is back home.  Overall she is doing well.  No recurrent spells or seizures.  Tolerating medication.   REVIEW OF SYSTEMS: Full 14 system review of systems performed and negative with exception of: as per HPI.  ALLERGIES: Allergies  Allergen Reactions   Chlorhexidine  Other (See Comments)    Unknown    HOME MEDICATIONS: Outpatient Medications Prior to Visit  Medication Sig Dispense Refill   amiodarone  (PACERONE ) 200 MG  tablet TAKE 1 TABLET(200 MG) BY MOUTH DAILY 30 tablet 0   amLODipine  (NORVASC ) 10 MG tablet TAKE 1 TABLET EVERY DAY (MUST KEEP UPCOMING APPOINTMENT WITH CARDIOLOGIST IN NOVEMBER 2024 BEFORE MORE REFILLS) 90 tablet 3   atorvastatin  (LIPITOR ) 40 MG tablet TAKE 1 TABLET(40 MG) BY MOUTH DAILY (Patient taking differently: Take 40 mg by mouth daily.) 90 tablet 2   carvedilol  (COREG ) 12.5 MG tablet TAKE 1 TABLET TWICE DAILY WITH MEALS (MUST KEEP UPCOMING APPOINTMENT WITH CARDIOLOGIST IN NOVEMBER 2024 BEFORE MORE REFILLS) 180 tablet 3   docusate sodium  (COLACE) 100 MG capsule Take 1 capsule (100 mg total) by mouth 2 (two) times daily. 60 capsule 0   irbesartan  (AVAPRO ) 300 MG tablet TAKE 1 TABLET EVERY DAY (MUST KEEP UPCOMING APPOINTMENT WITH CARDIOLOGIST IN NOVEMBER 2024 BEFORE MORE REFILLS) 90 tablet 3   metFORMIN  (GLUCOPHAGE ) 500 MG tablet Take 0.5 tablets (250 mg total) by mouth 2 (two) times daily with a meal. 60 tablet 0   polyethylene glycol powder (GLYCOLAX /MIRALAX ) 17 GM/SCOOP powder Take 17 g by mouth daily. 238 g 0   warfarin (COUMADIN ) 4 MG tablet TAKE 1 AND 1/2 TABLETS with supper starting Friday. Appointment with coumadin  clinic on Monday for further instructions. 180 tablet 1   levETIRAcetam  (KEPPRA ) 500 MG tablet Take 1 tablet (500 mg total) by mouth 2 (two) times daily. No further refills if appt on 09/28/23 is not kept. 60 tablet 1   acetaminophen  (TYLENOL ) 325 MG tablet Take 1-2 tablets (325-650 mg total) by mouth every 4 (four) hours as needed for mild pain. (Patient  not taking: Reported on 09/28/2023)     No facility-administered medications prior to visit.    PAST MEDICAL HISTORY: Past Medical History:  Diagnosis Date   Abnormal chest CT    Anemia    Aortic atherosclerosis (HCC)    Asthma    Atrial enlargement, left    severe   Atrial fibrillation (HCC) 01/05/2011   Chronic persistent, failed DCCV    Atrial flutter (HCC)    Bronchospasm 1998   Cardiomegaly    Carotid bruit     Chronic diastolic congestive heart failure (HCC)    Diabetes mellitus    Type II   Ejection fraction < 50%    35-40%,    Heart murmur    History of blood transfusion    History of radiation therapy 03/18/19-04/15/19   endometrial - vaginal brachytherapy -  Dr. Shannon    Hypercholesterolemia    Hypertension    Junctional rhythm 09/14/2018   Noted on EKG   Left bundle branch block (LBBB) 09/14/2018   Noted on EKG   LVH (left ventricular hypertrophy) 10/10/2016   Moderate, Noted on ECHO   Mitral regurgitation    Obesity    Obesity (BMI 30-39.9) 01/16/2012   Persistent atrial fibrillation (HCC)    Polyp of rectum    Rheumatic fever 01/16/2012   Reported during childhood   S/P Maze operation for atrial fibrillation 02/15/2012   Complete biatrial lesion set using bipolar radiofrequency and cryothermy ablation with clipping of LA appendage   S/P mitral valve replacement with metallic valve 02/15/2012   31mm Sorin [employer]omed[employer] Optiform mechanical prosthesis   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   28mm Edwards mc3 ring annuloplasty   Shortness of breath    with exertion   Sleep apnea    DOES NOT HAVE CPAP   Stroke (HCC) 2014   Post op , left arm weakness   Tricuspid regurgitation 01/16/2012   Uterine cancer (HCC)     PAST SURGICAL HISTORY: Past Surgical History:  Procedure Laterality Date   CARDIAC CATHETERIZATION  >5 years   CARDIOVASCULAR STRESS TEST  09/2010   CARDIOVERSION  03/25/2011   Procedure: CARDIOVERSION;  Surgeon: Candyce CANDIE Reek, MD;  Location: [facility];  Service: Cardiovascular;  Laterality: N/A;   CARDIOVERSION N/A 07/27/2012   Procedure: CARDIOVERSION;  Surgeon: Candyce CANDIE Reek, MD;  Location: [facility] ENDOSCOPY;  Service: Cardiovascular;  Laterality: N/A;   CESAREAN SECTION     COLONOSCOPY WITH PROPOFOL  N/A 04/27/2016   Procedure: COLONOSCOPY WITH PROPOFOL ;  Surgeon: Jerrell Sol, MD;  Location: [facility] ENDOSCOPY;  Service: Endoscopy;  Laterality: N/A;    CRANIOTOMY Left 12/03/2021   Procedure: FRONTOTEMPORAL CRANIOTOMY FOR EVACUATION SUBDURAL HEMATOMA;  Surgeon: Carollee Lani BROCKS, DO;  Location: [facility] OR;  Service: Neurosurgery;  Laterality: Left;   INTRAOPERATIVE TRANSESOPHAGEAL ECHOCARDIOGRAM  02/15/2012   Procedure: INTRAOPERATIVE TRANSESOPHAGEAL ECHOCARDIOGRAM;  Surgeon: Sudie VEAR Laine, MD;  Location[facility] OR;  Service: Open Heart Surgery;  Laterality: N/A;   MAZE  02/15/2012   Procedure: MAZE;  Surgeon: Sudie VEAR Laine, MD;  Location: [facility] OR;  Service: Open Heart Surgery;  Laterality: N/A;   MITRAL VALVE REPLACEMENT  02/15/2012   Procedure: MITRAL VALVE (MV) REPLACEMENT;  Surgeon: Sudie VEAR Laine, MD;  Location: [facility] OR;  Service: Open Heart Surgery;  Laterality: N/A;   ROBOTIC ASSISTED LAPAROSCOPIC HYSTERECTOMY AND SALPINGECTOMY Bilateral 01/02/2019   Procedure: XI ROBOTIC ASSISTED LAPAROSCOPIC TOTAL HYSTERECTOMY WITH BILATERAL SALPINGOOPHORECTOMY, SENTINEL LYMPH NODE BIOPSY;  Surgeon: Viktoria Comer SAUNDERS, MD;  Location: [facility];  Service: Gynecology;  Laterality: Bilateral;   TEE WITHOUT CARDIOVERSION  12/21/2011   Procedure: TRANSESOPHAGEAL ECHOCARDIOGRAM (TEE);  Surgeon: Candyce CANDIE Reek, MD;  Location: [facility] ENDOSCOPY;  Service: Cardiovascular;  Laterality: N/A;   TOOTH EXTRACTION N/A 11/19/2021   Procedure: DENTAL EXTRACTION TEETH NUMBER 14, 19, 30, 31;  Surgeon: Sheryle Hamilton, DMD;  Location: [facility] OR;  Service: Oral Surgery;  Laterality: N/A;   TRICUSPID VALVE REPLACEMENT  02/15/2012   Procedure: TRICUSPID VALVE REPAIR;  Surgeon: Sudie VEAR Laine, MD;  Location: [facility] OR;  Service: Open Heart Surgery;  Laterality: N/A;   TUBAL LIGATION     at time of her c-section    FAMILY HISTORY: Family History  Problem Relation Age of Onset   Asthma Mother    Diabetes Mother    Hypertension Mother    Hypertension Brother    Heart attack Neg Hx    Stroke Neg Hx    Colon cancer Neg Hx    Colon polyps Neg Hx    Liver disease Neg Hx    Uterine cancer Neg Hx     Ovarian cancer Neg Hx    Breast cancer Neg Hx     SOCIAL HISTORY: Social History   Socioeconomic History   Marital status: Married    Spouse name: Not on file   Number of children: Not on file   Years of education: Not on file   Highest education level: Not on file  Occupational History   Occupation: Insurance account manager    Employer: [employer]  Tobacco Use   Smoking status: Never   Smokeless tobacco: Never  Vaping Use   Vaping status: Never Used  Substance and Sexual Activity   Alcohol use: No   Drug use: No   Sexual activity: Yes    Birth control/protection: Post-menopausal  Other Topics Concern   Not on file  Social History Narrative   Pt lives in [place] with spouse.  Works at [employer]. 2 grown children, 1 grandchild   Social Drivers of Corporate investment banker Strain: Not on [employer] Insecurity: Not on file  Transportation Needs: Patient Declined (12/02/2021)   PRAPARE - Administrator, Civil Service (Medical): Patient declined    Lack of Transportation (Non-Medical): Patient declined  Physical Activity: Not on file  Stress: Not on file  Social Connections: Not on file  Intimate Partner Violence: Patient Declined (12/02/2021)   Humiliation, Afraid, Rape, and Kick questionnaire    Fear of Current or Ex-Partner: Patient declined    Emotionally Abused: Patient declined    Physically Abused: Patient declined    Sexually Abused: Patient declined     PHYSICAL EXAM  GENERAL EXAM/CONSTITUTIONAL: Vitals:  Vitals:   09/28/23 1102  BP: (!) 149/83  Pulse: 87  Weight: 207 lb (93.9 kg)  Height: 5' 7 (1.702 m)    Patient is in no distress; well developed, nourished and groomed; neck is supple  CARDIOVASCULAR: Examination of carotid arteries is normal; no carotid bruits Regular rate and rhythm, no murmurs Examination of peripheral vascular system by observation and palpation is normal  MUSCULOSKELETAL: Gait,  strength, tone, movements noted in Neurologic exam below  NEUROLOGIC: MENTAL STATUS:  awake, alert, oriented to person, place and time recent and remote memory intact normal attention and concentration language fluent, comprehension intact, naming intact fund of knowledge appropriate  CRANIAL NERVE:  2nd - no papilledema on fundoscopic exam 2nd, 3rd, 4th, 6th - pupils equal and reactive  to light, visual fields full to confrontation, extraocular muscles intact, no nystagmus 5th - facial sensation symmetric 7th - facial strength symmetric 8th - hearing intact 9th - palate elevates symmetrically, uvula midline 11th - shoulder shrug symmetric 12th - tongue protrusion midline  MOTOR:  normal bulk and tone, full strength in the BUE, BLE  SENSORY:  normal and symmetric to light touch, temperature, vibration  COORDINATION:  finger-nose-finger, fine finger movements normal  REFLEXES:  deep tendon reflexes TRACE  and symmetric  GAIT/STATION:  narrow based gait; no use of AD     DIAGNOSTIC DATA (LABS, IMAGING, TESTING) - I reviewed patient records, labs, notes, testing and imaging myself where available.  Lab Results  Component Value Date   WBC 5.3 01/18/2022   HGB 11.2 (L) 01/18/2022   HCT 33.0 (L) 01/18/2022   MCV 98.1 01/18/2022   PLT 121 (L) 01/18/2022      Component Value Date/Time   NA 139 01/18/2022 0143   NA 140 08/10/2021 0838   K 4.1 01/18/2022 0143   CL 101 01/18/2022 0143   CO2 24 01/18/2022 0100   GLUCOSE 144 (H) 01/18/2022 0143   BUN 17 01/18/2022 0143   BUN 19 08/10/2021 0838   CREATININE 1.00 01/18/2022 0143   CREATININE 1.02 (H) 06/20/2019 0759   CREATININE 0.67 05/28/2015 0958   CALCIUM  8.7 (L) 01/18/2022 0100   PROT 7.4 01/18/2022 0100   PROT 8.4 08/10/2021 0838   ALBUMIN  3.6 01/18/2022 0100   ALBUMIN  4.6 08/10/2021 0838   AST 31 01/18/2022 0100   AST 20 06/20/2019 0759   ALT 22 01/18/2022 0100   ALT 13 06/20/2019 0759   ALKPHOS 56  01/18/2022 0100   BILITOT 1.0 01/18/2022 0100   BILITOT 0.5 08/10/2021 0838   BILITOT 0.5 06/20/2019 0759   GFRNONAA 54 (L) 01/18/2022 0100   GFRNONAA 58 (L) 06/20/2019 0759   GFRAA >60 06/20/2019 0759   Lab Results  Component Value Date   CHOL 166 08/10/2021   HDL 35 (L) 08/10/2021   LDLCALC 111 (H) 08/10/2021   TRIG 110 08/10/2021   CHOLHDL 4.7 (H) 08/10/2021   Lab Results  Component Value Date   HGBA1C 5.8 (H) 12/02/2021   No results found for: VITAMINB12 Lab Results  Component Value Date   TSH 2.940 10/03/2016    12/02/21 CT head - Bilateral mixed density subdural collections, most likely acute on chronic hemorrhage, measuring up to 16 mm on the left and 6 mm on the right, with 8 mm left to right midline shift and effacement of the left-greater-than-right sulci.   12/08/21 EEG ABNORMALITY - Sharp wave, left frontal region - Continuous slow, generalized and lateralized left hemisphere    IMPRESSION: This study showed evidence of epileptogenicity arising from left frontal region. Additionally there is cortical dysfunction arising from left hemisphere likely secondary to underlying structural abnormality. Lastly there is  moderate diffuse encephalopathy, nonspecific etiology. No seizures were seen throughout the recording.   01/18/22 MRI brain 1. No acute infarct identified. 2. Small Left (3 mm) and trace Right side Subdural Hematomas. No significant intracranial mass effect. Previous left side craniotomy. 3. Mild to moderate for age underlying chronic small vessel disease, including occasional chronic micro hemorrhages.    ASSESSMENT AND PLAN  68 y.o. year old female here with:  Dx:  1. Partial symptomatic epilepsy with simple partial seizures, not intractable, without status epilepticus (HCC)   2. Subdural hematoma (HCC)      PLAN:  LEFT SUBDURAL HEMATOMA (  12/02/21; s/p fall on anticoagulation; s/p  burr hole evacuation --> abnl spells of aphasia;  possible partial seizures; abnl EEG)  - recommend to continue levetiracetam  500mg  twice a day  - refill provided   - Please maintain precautions. Do not participate in activities where a loss of awareness could harm you or someone else. No swimming alone, no tub bathing, no hot tubs, no driving, no operating motorized vehicles (cars, ATVs, motocycles, etc), lawnmowers, power tools or firearms. No standing at heights, such as rooftops, ladders or stairs. Avoid hot objects such as stoves, heaters, open fires. Wear a helmet when riding a bicycle, scooter, skateboard, etc. and avoid areas of traffic. Set your water  heater to 120 degrees or less.    Meds ordered this encounter  Medications   levETIRAcetam  (KEPPRA ) 500 MG tablet    Sig: Take 1 tablet (500 mg total) by mouth 2 (two) times daily.    Dispense:  180 tablet    Refill:  3   Return in about 1 year (around 09/27/2024).    I personally spent a total of 15 minutes in the care of the patient today including preparing to see the patient, performing a medically appropriate exam/evaluation, placing orders, and documenting clinical information in the EHR.  Harlene Bogaert, AGNP-BC  [facility] [address] [place], [place] [address]  Phone [phone] Fax [phone] Note: This document was prepared with digital dictation and possible smart phrase technology. Any transcriptional errors that result from this process are unintentional.

## 2023-09-28 ENCOUNTER — Encounter: Payer: Self-pay | Admitting: Adult Health

## 2023-09-28 ENCOUNTER — Ambulatory Visit: Admitting: Adult Health

## 2023-09-28 VITALS — BP 149/83 | HR 87 | Ht 67.0 in | Wt 207.0 lb

## 2023-09-28 DIAGNOSIS — G40109 Localization-related (focal) (partial) symptomatic epilepsy and epileptic syndromes with simple partial seizures, not intractable, without status epilepticus: Secondary | ICD-10-CM | POA: Diagnosis not present

## 2023-09-28 DIAGNOSIS — S065XAA Traumatic subdural hemorrhage with loss of consciousness status unknown, initial encounter: Secondary | ICD-10-CM

## 2023-09-28 MED ORDER — LEVETIRACETAM 500 MG PO TABS: 500.0000 mg | ORAL_TABLET | Freq: Two times a day (BID) | ORAL | 3 refills | Status: AC

## 2023-09-28 NOTE — Patient Instructions (Addendum)
 Your Plan:  Continue Keppra  500 mg twice daily for seizure prevention - 1 year refill sent to your pharmacy  Please call with any additional seizure activity      Follow-up in 1 year or call earlier if needed      Thank you for coming to see us  at [facility]. I hope we have been able to provide you high quality care today.  You may receive a patient satisfaction survey over the next few weeks. We would appreciate your feedback and comments so that we may continue to improve ourselves and the health of our patients.

## 2023-10-04 ENCOUNTER — Other Ambulatory Visit: Payer: Self-pay | Admitting: Cardiology

## 2023-10-04 DIAGNOSIS — I10 Essential (primary) hypertension: Secondary | ICD-10-CM

## 2023-10-05 ENCOUNTER — Ambulatory Visit: Attending: Interventional Cardiology

## 2023-10-05 DIAGNOSIS — Z5181 Encounter for therapeutic drug level monitoring: Secondary | ICD-10-CM

## 2023-10-05 DIAGNOSIS — Z954 Presence of other heart-valve replacement: Secondary | ICD-10-CM

## 2023-10-05 DIAGNOSIS — I480 Paroxysmal atrial fibrillation: Secondary | ICD-10-CM

## 2023-10-05 DIAGNOSIS — I059 Rheumatic mitral valve disease, unspecified: Secondary | ICD-10-CM

## 2023-10-05 LAB — POCT INR: INR: 2.6 (ref 2.0–3.0)

## 2023-10-05 NOTE — Patient Instructions (Signed)
 Continue taking warfarin 1.5 tablets daily EXCEPT 1 tablet on Sundays and Thursdays.  EAT GREENS 2 TIMES PER WEEK Recheck INR in 6 weeks.  Coumadin  Clinic [phone]

## 2023-10-05 NOTE — Progress Notes (Signed)
 INR 2.6 Please see anticoagulation encounter Continue taking warfarin 1.5 tablets daily EXCEPT 1 tablet on Sundays and Thursdays.  EAT GREENS 2 TIMES PER WEEK Recheck INR in 6 weeks.  Coumadin  Clinic [phone]

## 2023-10-12 ENCOUNTER — Telehealth: Payer: Self-pay | Admitting: *Deleted

## 2023-10-12 NOTE — Telephone Encounter (Signed)
 CALLED PATIENT TO INFORM OF FU APPT. BEING MOVED FROM 11-02-23 TO 11-09-23 @ 9 AM, DUE TO DR. KINARD BEING IN THE OR, SPOKE WITH PATIENT AND SHE IS AGREEABLE TO DO THIS

## 2023-11-02 ENCOUNTER — Ambulatory Visit: Payer: Self-pay | Admitting: Radiation Oncology

## 2023-11-03 ENCOUNTER — Ambulatory Visit: Admitting: Podiatry

## 2023-11-03 VITALS — Ht 67.0 in | Wt 207.0 lb

## 2023-11-03 DIAGNOSIS — M79672 Pain in left foot: Secondary | ICD-10-CM

## 2023-11-03 DIAGNOSIS — M7751 Other enthesopathy of right foot: Secondary | ICD-10-CM | POA: Diagnosis not present

## 2023-11-03 DIAGNOSIS — M79671 Pain in right foot: Secondary | ICD-10-CM | POA: Diagnosis not present

## 2023-11-03 DIAGNOSIS — M7752 Other enthesopathy of left foot: Secondary | ICD-10-CM

## 2023-11-03 DIAGNOSIS — B351 Tinea unguium: Secondary | ICD-10-CM | POA: Diagnosis not present

## 2023-11-03 NOTE — Progress Notes (Signed)
 Patient presents for evaluation and treatment of tenderness and some redness around nails feet.  Tenderness around toes with walking and wearing shoes.  Some tenderness of the hallux on the plantar medial aspect bilaterally.  Physical exam:  General appearance: Alert, pleasant, and in no acute distress.  Vascular: Pedal pulses: DP 1/4 B/L, PT 0/4 B/L.  Moderate edema lower legs bilaterally.  Capillary refill time immediate bilaterally  Neurologic: Light touch intact bilaterally.  Vibratory sensation diminished to feet bilaterally.  Normal Achilles tendon reflex.  Dermatologic:  Nails thickened, disfigured, discolored 1-5 BL with subungual debris.  Redness and hypertrophic nail folds along nail folds bilaterally but no signs of drainage or infection.  Skin is thin and atrophic with no hair growth on lower extremity areas of hyperpigmentation.  Musculoskeletal:  Some soreness at the plantar medial aspect of the IPJ of the hallux bilaterally.  Hallux valgus bilaterally hammertoes 2 through 5 bilaterally.  Normal muscle strength lower extremity bilaterally   Diagnosis: 1. Painful onychomycotic nails 1 through 5 bilaterally. 2. Pain toes 1 through 5 bilaterally. 3.  Capsulitis medial plantar IPJ  hallux bilaterally  Plan: -New patient office visit for evaluation and management level 3.  Modifier 25. - Recommend wearing good supportive well cushioned shoes with a wide toebox to accommodate the great toe and the bunion deformities.  She can use OTC diclofenac  gel on the great toe for relief.  Recommend periodic debridement of the nails.  -Debrided onychomycotic nails 1 through 5 bilaterally.  Sharply debrided nails with nail clipper and reduced with a power bur.  Return 3 months [facility]

## 2023-11-07 NOTE — Progress Notes (Shared)
 Radiation Oncology         [phone] ________________________________  Name: [name] MRN: [id number]  Date: 11/09/2023  DOB: 01/02/1956  Follow-Up Visit Note  CC: [name], [name], PA  [name], [place], [place]  No diagnosis found.  Diagnosis:  Stage II endometrial cancer, high-grade    Interval Since Last Radiation: 3 years, 6 months, and 25 days    Radiation Treatment Dates: 03/18/2019 through 04/15/2019 Site Technique Total Dose (Gy) Dose per Fx (Gy) Completed Fx Beam Energies  Vagina: Pelvis HDR-brachy 30/30 6 5/5 Ir-192    Narrative:  The patient returns today for routine annual follow-up. She was last seen here for follow-up on 10/27/22. Patient continued to follow up with their specialists to manage their chronic conditions.   In the interval since she was last seen, she presented for a follow up visit with NP Cross on 05/16/23 during which she was noted NED on exam with no symptoms or concerns indication disease recurrence.                     She underwent bilateral screening mammogram on 11/21/22 showing no evidence of malignancy.   No other significant oncologic interval history since the patient was last seen.    Allergies:  is allergic to chlorhexidine .  Meds: Current Outpatient Medications  Medication Sig Dispense Refill   amiodarone  (PACERONE ) 200 MG tablet TAKE 1 TABLET(200 MG) BY MOUTH DAILY 30 tablet 0   amLODipine  (NORVASC ) 10 MG tablet Take 1 tablet (10 mg total) by mouth daily. 90 tablet 0   atorvastatin  (LIPITOR ) 40 MG tablet TAKE 1 TABLET(40 MG) BY MOUTH DAILY 90 tablet 2   carvedilol  (COREG ) 12.5 MG tablet TAKE 1 TABLET TWICE DAILY WITH MEALS 180 tablet 0   docusate sodium  (COLACE) 100 MG capsule Take 1 capsule (100 mg total) by mouth 2 (two) times daily. 60 capsule 0   irbesartan  (AVAPRO ) 300 MG tablet Take 1 tablet (300 mg total) by mouth daily. 90 tablet 0   levETIRAcetam  (KEPPRA ) 500 MG tablet Take 1 tablet (500 mg total) by mouth 2 (two) times daily.  180 tablet 3   metFORMIN  (GLUCOPHAGE ) 500 MG tablet Take 0.5 tablets (250 mg total) by mouth 2 (two) times daily with a meal. 60 tablet 0   polyethylene glycol powder (GLYCOLAX /MIRALAX ) 17 GM/SCOOP powder Take 17 g by mouth daily. 238 g 0   warfarin (COUMADIN ) 4 MG tablet TAKE 1 AND 1/2 TABLETS with supper starting Friday. Appointment with coumadin  clinic on Monday for further instructions. 180 tablet 1   No current facility-administered medications for this visit.    Physical Findings: The patient is in no acute distress. Patient is alert and oriented.  vitals were not taken for this visit. .  No significant changes. Lungs are clear to auscultation bilaterally. Heart has regular rate and rhythm. No palpable cervical, supraclavicular, or axillary adenopathy. Abdomen soft, non-tender, normal bowel sounds.   Lab Findings: Lab Results  Component Value Date   WBC 5.3 01/18/2022   HGB 11.2 (L) 01/18/2022   HCT 33.0 (L) 01/18/2022   MCV 98.1 01/18/2022   PLT 121 (L) 01/18/2022    Radiographic Findings: No results found.  Impression: Stage II endometrial cancer, high-grade   The patient is recovering from the effects of radiation.  ***  Plan:  ***   *** minutes of total time was spent for this patient encounter, including preparation, face-to-face counseling with the patient and coordination of care, physical exam, and  documentation of the encounter. ____________________________________  Lynwood CHARM Nasuti, PhD, MD  This document serves as a record of services personally performed by Lynwood Nasuti, MD. It was created on his behalf by Reymundo Cartwright, a trained medical scribe. The creation of this record is based on the scribe's personal observations and the provider's statements to them. This document has been checked and approved by the attending provider.

## 2023-11-09 ENCOUNTER — Ambulatory Visit: Admitting: Radiation Oncology

## 2023-11-09 ENCOUNTER — Telehealth: Payer: Self-pay | Admitting: Radiation Oncology

## 2023-11-09 NOTE — Telephone Encounter (Signed)
 10/30 @ 10:07 am Left voicemail for patient to call our office to be r/s for her missed appt on today.

## 2023-11-13 ENCOUNTER — Telehealth: Payer: Self-pay | Admitting: *Deleted

## 2023-11-13 ENCOUNTER — Ambulatory Visit
Admission: RE | Admit: 2023-11-13 | Discharge: 2023-11-13 | Disposition: A | Discharge status: Home / Self Care | Source: Ambulatory Visit | Attending: Radiation Oncology | Admitting: Radiation Oncology

## 2023-11-13 ENCOUNTER — Encounter: Payer: Self-pay | Admitting: Radiation Oncology

## 2023-11-13 VITALS — BP 148/74 | HR 92 | Temp 97.6°F | Resp 18 | Ht 67.0 in | Wt 209.6 lb

## 2023-11-13 DIAGNOSIS — C541 Malignant neoplasm of endometrium: Secondary | ICD-10-CM

## 2023-11-13 DIAGNOSIS — Z79899 Other long term (current) drug therapy: Secondary | ICD-10-CM | POA: Diagnosis not present

## 2023-11-13 DIAGNOSIS — Z8542 Personal history of malignant neoplasm of other parts of uterus: Secondary | ICD-10-CM | POA: Insufficient documentation

## 2023-11-13 DIAGNOSIS — Z7984 Long term (current) use of oral hypoglycemic drugs: Secondary | ICD-10-CM | POA: Insufficient documentation

## 2023-11-13 DIAGNOSIS — Z923 Personal history of irradiation: Secondary | ICD-10-CM | POA: Insufficient documentation

## 2023-11-13 NOTE — Progress Notes (Signed)
 Radiation Oncology         [phone] ________________________________  Name: [name] MRN: [id number]  Date: 11/13/2023  DOB: May 18, 1955  Follow-Up Visit Note  CC: [name], [name], PA  [name], [place], [place]    ICD-10-CM   1. Endometrial cancer (HCC) [C54.1]  C54.1       Diagnosis: Stage II endometrial cancer, high-grade   Interval Since Last Radiation:  almost 4 years and 7 months   Radiation Treatment Dates: 03/18/2019 through 04/15/2019 Site Technique Total Dose (Gy) Dose per Fx (Gy) Completed Fx Beam Energies  Vagina: Pelvis HDR-brachy 30/30 6 5/5 Ir-192    Narrative:  The patient returns today for a routine annual follow-up. She was last seen here for follow-up on 10/27/22.     In the interval since her last visit, she was seen by NP Cross (Gyn-Onc) for a routine follow-up visit. She was noted to be doing well and NED on examination at that time.            Relevant imaging performed in the interval includes a bilateral screening mammogram on 11/17/23 which showed no evidence of malignancy in either breast.   No other significant oncologic interval history since the patient was last seen for follow-up.   Patient is doing well overall today. She denies any abdominal pain, abnormal bloating, vaginal bleeding, or changes in her bowel or bladder habits. She is using her dilators approximately once per week.                    Allergies:  is allergic to chlorhexidine .  Meds: Current Outpatient Medications  Medication Sig Dispense Refill   amiodarone  (PACERONE ) 200 MG tablet TAKE 1 TABLET(200 MG) BY MOUTH DAILY 30 tablet 0   amLODipine  (NORVASC ) 10 MG tablet Take 1 tablet (10 mg total) by mouth daily. 90 tablet 0   atorvastatin  (LIPITOR ) 40 MG tablet TAKE 1 TABLET(40 MG) BY MOUTH DAILY 90 tablet 2   carvedilol  (COREG ) 12.5 MG tablet TAKE 1 TABLET TWICE DAILY WITH MEALS 180 tablet 0   docusate sodium  (COLACE) 100 MG capsule Take 1 capsule (100 mg total) by mouth 2 (two)  times daily. 60 capsule 0   irbesartan  (AVAPRO ) 300 MG tablet Take 1 tablet (300 mg total) by mouth daily. 90 tablet 0   levETIRAcetam  (KEPPRA ) 500 MG tablet Take 1 tablet (500 mg total) by mouth 2 (two) times daily. 180 tablet 3   metFORMIN  (GLUCOPHAGE ) 500 MG tablet Take 0.5 tablets (250 mg total) by mouth 2 (two) times daily with a meal. 60 tablet 0   polyethylene glycol powder (GLYCOLAX /MIRALAX ) 17 GM/SCOOP powder Take 17 g by mouth daily. 238 g 0   warfarin (COUMADIN ) 4 MG tablet TAKE 1 AND 1/2 TABLETS with supper starting Friday. Appointment with coumadin  clinic on Monday for further instructions. 180 tablet 1   No current facility-administered medications for this encounter.    Physical Findings: The patient is in no acute distress. Patient is alert and oriented.  height is 5' 7 (1.702 m) and weight is 209 lb 9.6 oz (95.1 kg). Her temperature is 97.6 F (36.4 C). Her blood pressure is 148/74 (abnormal) and her pulse is 92. Her respiration is 18 and oxygen saturation is 95%. .  No significant changes. Lungs are clear to auscultation bilaterally. Heart has regular rate and rhythm. No palpable cervical, supraclavicular, or axillary adenopathy. Abdomen soft, non-tender, normal bowel sounds.  On pelvic examination the external genitalia were unremarkable.  Speculum  exam reveals atrophic vaginal mucosa, with mild radiation changes present at the cuff. No mucosal lesions. Bimanual exam reveals no nodularity or masses. Rectovaginal exam confirms these findings.   Lab Findings: Lab Results  Component Value Date   WBC 5.3 01/18/2022   HGB 11.2 (L) 01/18/2022   HCT 33.0 (L) 01/18/2022   MCV 98.1 01/18/2022   PLT 121 (L) 01/18/2022    Radiographic Findings: No results found.  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 No evidence  of disease recurrence on clinical exam today.   Plan:  Per NCCN guidelines, patient will follow up with Dr. Viktoria in 6 months. At that time, she will be >5 years out from her treatment. If she remains disease free she can proceed with annual follow-ups with her regular gynecologist. Radiation follow-up PRN. Patient was educated on signs/symptoms that may indicate disease recurrence and understands to notify us  if she experiences any of them.   We appreciate the opportunity to take part in this patient's care.     30 minutes of total time was spent for this patient encounter, including preparation, face-to-face counseling with the patient and coordination of care, physical exam, and documentation of the encounter. ____________________________________   Leeroy Due, PA-C   Lynwood CHARM Nasuti, PhD, MD   [facility]  Radiation Oncology Direct Dial: [phone]  Fax: [phone] [email or web address]    This document serves as a record of services personally performed by Lynwood Nasuti, MD. It was created on his behalf by Dorthy Fuse, a trained medical scribe. The creation of this record is based on the scribe's personal observations and the provider's statements to them. This document has been checked and approved by the attending provider.

## 2023-11-13 NOTE — Progress Notes (Signed)
 [name] is here today for follow up post radiation to the pelvic.  They completed their radiation on:   04/15/19 Does the patient complain of any of the following:  Pain: NO Abdominal bloating: No Diarrhea/Constipation: No Nausea/Vomiting: No Vaginal Discharge: No Blood in Urine or Stool: No Urinary Issues (dysuria/incomplete emptying/ incontinence/ increased frequency/urgency): No Does patient report using vaginal dilator 2-3 times a week and/or sexually active 2-3 weeks: Yes  Post radiation skin changes: No   Additional comments if applicable:   BP (!) 148/74 (BP Location: Right Arm, Patient Position: Sitting, Cuff Size: Large)   Pulse 92   Temp 97.6 F (36.4 C)   Resp 18   Ht 5' 7 (1.702 m)   Wt 209 lb 9.6 oz (95.1 kg)   SpO2 95%   BMI 32.83 kg/m

## 2023-11-13 NOTE — Telephone Encounter (Signed)
 Per radiation patient scheduled for a follow up appt with Dr Viktoria on 5/8 at 1:15 pm. Radiation to call the patient

## 2023-11-16 ENCOUNTER — Ambulatory Visit: Attending: Cardiovascular Disease

## 2023-11-16 DIAGNOSIS — I349 Nonrheumatic mitral valve disorder, unspecified: Secondary | ICD-10-CM | POA: Diagnosis not present

## 2023-11-16 DIAGNOSIS — Z5181 Encounter for therapeutic drug level monitoring: Secondary | ICD-10-CM

## 2023-11-16 DIAGNOSIS — Z954 Presence of other heart-valve replacement: Secondary | ICD-10-CM | POA: Diagnosis not present

## 2023-11-16 DIAGNOSIS — I059 Rheumatic mitral valve disease, unspecified: Secondary | ICD-10-CM

## 2023-11-16 DIAGNOSIS — I480 Paroxysmal atrial fibrillation: Secondary | ICD-10-CM | POA: Diagnosis not present

## 2023-11-16 LAB — POCT INR: INR: 1.8 — AB (ref 2.0–3.0)

## 2023-11-16 NOTE — Progress Notes (Signed)
 INR 1.8 Please see anticoagulation encounter Take 2 tablets tonight only then Continue taking warfarin 1.5 tablets daily EXCEPT 1 tablet on Sundays and Thursdays.  EAT GREENS 2 TIMES PER WEEK Recheck INR in 4 weeks.  Coumadin  Clinic [phone]

## 2023-11-16 NOTE — Patient Instructions (Addendum)
 Take 2 tablets tonight only then Continue taking warfarin 1.5 tablets daily EXCEPT 1 tablet on Sundays and Thursdays.  EAT GREENS 2 TIMES PER WEEK Recheck INR in 5 weeks.  Coumadin  Clinic [phone]

## 2023-11-20 ENCOUNTER — Ambulatory Visit
Admission: RE | Admit: 2023-11-20 | Discharge: 2023-11-20 | Disposition: A | Discharge status: Home / Self Care | Source: Ambulatory Visit | Attending: Physician Assistant | Admitting: Physician Assistant

## 2023-11-20 DIAGNOSIS — Z1231 Encounter for screening mammogram for malignant neoplasm of breast: Secondary | ICD-10-CM

## 2023-12-17 ENCOUNTER — Other Ambulatory Visit: Payer: Self-pay | Admitting: Cardiology

## 2023-12-17 DIAGNOSIS — I10 Essential (primary) hypertension: Secondary | ICD-10-CM

## 2023-12-21 ENCOUNTER — Ambulatory Visit: Attending: Interventional Cardiology

## 2023-12-21 DIAGNOSIS — I480 Paroxysmal atrial fibrillation: Secondary | ICD-10-CM | POA: Diagnosis not present

## 2023-12-21 DIAGNOSIS — Z954 Presence of other heart-valve replacement: Secondary | ICD-10-CM

## 2023-12-21 DIAGNOSIS — I349 Nonrheumatic mitral valve disorder, unspecified: Secondary | ICD-10-CM | POA: Diagnosis not present

## 2023-12-21 DIAGNOSIS — Z5181 Encounter for therapeutic drug level monitoring: Secondary | ICD-10-CM

## 2023-12-21 DIAGNOSIS — I059 Rheumatic mitral valve disease, unspecified: Secondary | ICD-10-CM

## 2023-12-21 LAB — POCT INR: INR: 1.7 — AB (ref 2.0–3.0)

## 2023-12-21 NOTE — Progress Notes (Signed)
 INR 1.7 Please see anticoagulation encounter Take 2 tablets tonight and Friday only then Continue taking warfarin 1.5 tablets daily EXCEPT 1 tablet on Sundays and Thursdays.  EAT GREENS 2 TIMES PER WEEK Recheck INR in 5 weeks.  Coumadin  Clinic [phone]

## 2023-12-21 NOTE — Patient Instructions (Signed)
 Take 2 tablets tonight and Friday only then Continue taking warfarin 1.5 tablets daily EXCEPT 1 tablet on Sundays and Thursdays.  EAT GREENS 2 TIMES PER WEEK Recheck INR in 5 weeks.  Coumadin  Clinic [phone]

## 2024-01-25 ENCOUNTER — Ambulatory Visit: Attending: Interventional Cardiology | Admitting: *Deleted

## 2024-01-25 DIAGNOSIS — Z5181 Encounter for therapeutic drug level monitoring: Secondary | ICD-10-CM

## 2024-01-25 DIAGNOSIS — Z954 Presence of other heart-valve replacement: Secondary | ICD-10-CM

## 2024-01-25 DIAGNOSIS — I480 Paroxysmal atrial fibrillation: Secondary | ICD-10-CM | POA: Diagnosis not present

## 2024-01-25 LAB — POCT INR: POC INR: 2.6

## 2024-01-25 NOTE — Patient Instructions (Signed)
 Description   Continue taking warfarin 1.5 tablets daily EXCEPT 1 tablet on Sundays and Thursdays.  EAT GREENS 2 TIMES PER WEEK Recheck INR in 6 weeks.  Coumadin  Clinic [phone]

## 2024-01-25 NOTE — Progress Notes (Signed)
 Lab Results  Component Value Date   INR 2.6 01/25/2024   INR 1.7 (A) 12/21/2023   INR 1.8 (A) 11/16/2023    Description   Continue taking warfarin 1.5 tablets daily EXCEPT 1 tablet on Sundays and Thursdays.  EAT GREENS 2 TIMES PER WEEK Recheck INR in 6 weeks.  Coumadin  Clinic [phone]

## 2024-01-26 NOTE — Progress Notes (Signed)
 " Cardiology Office Note   Date:  01/29/2024  ID:  Harris, Kistler 06-Oct-1955, MRN [id number] PCP: Alvera Reagin, PA  [facility] Providers Cardiologist:  Oneil Parchment, MD   History of Present Illness [name] is a 69 y.o. 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  Has a history of atrial flutter for which she underwent cardioversion in 2014 and a history of uterine cancer.  Currently on Coumadin  and amiodarone .  Patient's most recent echo in 2023 showed LVEF 65% with a 31 mm Sorin mechanical mitral valve functioning normally.  Patient was last seen October 2024 and at that time had no complaints of shortness of breath or chest pain.  Was managing her Coumadin  levels and taking her amiodarone  as prescribed.  Labs showed hemoglobin of 11.2, creatinine of 1 and LDL of 182.  Tried to manage her cholesterol levels and seen a decrease in weight down to 217 pounds.  Currently on Lipitor  40 mg daily for cholesterol management.  Patient is also under the care of another provider who has been monitoring her thyroid  and liver levels due to amiodarone .  Today, she presents with a hx of permanent atrial fibrillation and a history of mitral valve replacement for cardiovascular evaluation.  She has atrial fibrillation treated with amiodarone  200 mg daily, carvedilol , and Coumadin  6 mg daily. Her INR was 1.7 in December and is now above 2 (labs done last week but I cannot access them).   She has a stable left bundle branch block. She takes Avapro  for blood pressure and Lipitor  for cholesterol. Recent lipids were LDL 57, triglycerides 127, and total cholesterol 117.  She has low hemoglobin with a recent value of 11.2. She is not taking iron or multivitamins.  She has prior tricuspid valve repair and mitral valve replacement. She has swelling in her feet and ankles and shortness of breath that she  attributes to fluid retention.  She uses a cane due to limited mobility, and her symptoms limit her ability to care for her grandchildren.  Reports no chest pain, pressure, or tightness. No orthopnea, PND. Reports no palpitations.   Discussed the use of AI scribe software for clinical note transcription with the patient, who gave verbal consent to proceed.  ROS: pertinent ROS in HPI   Studies Reviewed EKG Interpretation Date/Time:  Monday January 29 2024 08:24:14 EST Ventricular Rate:  79 PR Interval:    QRS Duration:  154 QT Interval:  468 QTC Calculation: 536 R Axis:   -27  Text Interpretation: Atrial fibrillation Left bundle branch block When compared with ECG of 14-Oct-2022 16:11, Atrial fibrillation has replaced Sinus rhythm T wave inversion now evident in Lateral leads Confirmed by Lucien Blanc [phone]) on 01/29/2024 8:34:13 AM     Risk Assessment/Calculations  CHA2DS2-VASc Score = 8   This indicates a 10.8% annual risk of stroke. The patient's score is based upon: CHF History: 1 HTN History: 1 Diabetes History: 1 Stroke History: 2 Vascular Disease History: 1 Age Score: 1 Gender Score: 1       Physical Exam VS:  BP 124/82   Pulse 79   Ht 5' 7 (1.702 m)   Wt 209 lb 12.8 oz (95.2 kg)   SpO2 94%   BMI 32.86 kg/m        Wt Readings from Last 3 Encounters:  01/29/24 209 lb 12.8 oz (95.2 kg)  11/13/23 209 lb 9.6  oz (95.1 kg)  11/03/23 207 lb (93.9 kg)    GEN: Well nourished, well developed in no acute distress NECK: No JVD; No carotid bruits CARDIAC: IRIR, + systolic murmur, rubs, gallops RESPIRATORY:  Clear to auscultation without rales, wheezing or rhonchi  ABDOMEN: Soft, non-tender, non-distended EXTREMITIES:  No edema; No deformity   ASSESSMENT AND PLAN  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 Mild murmur present, fluid retention noted. - Ordered  echocardiogram to assess heart valve function. -hold off on fish oil with coumadin  until I consult with Dr. Jeffrie  Atrial fibrillation Chronic atrial fibrillation managed with amiodarone  and carvedilol . INR adjusted to over 2 with Coumadin . - Continue amiodarone  200 mg daily. Normal TSH and live function - Continue carvedilol . - Continue Coumadin  with INR monitoring.  Heart failure with peripheral edema Heart failure with peripheral edema, fluid retention and ankle swelling noted. Shortness of breath present. - Prescribed Lasix  20 mg as needed for fluid retention. - Monitor weight daily and take Lasix  if weight increases by 1-2 pounds overnight or 5 pounds in a week. -update an echo  Primary hypertension Hypertension stable with Avapro  and carvedilol . - Continue Avapro . - Continue carvedilol .  Left bundle branch block Chronic left bundle branch block, stable with no new concerns.     Dispo: She can return in a year for follow-up with me or Dr. Jeffrie  Signed, Orren LOISE Fabry, PA-C   "

## 2024-01-29 ENCOUNTER — Ambulatory Visit: Attending: Physician Assistant | Admitting: Physician Assistant

## 2024-01-29 ENCOUNTER — Encounter: Payer: Self-pay | Admitting: Physician Assistant

## 2024-01-29 VITALS — BP 124/82 | HR 79 | Ht 67.0 in | Wt 209.8 lb

## 2024-01-29 DIAGNOSIS — I519 Heart disease, unspecified: Secondary | ICD-10-CM

## 2024-01-29 DIAGNOSIS — Z954 Presence of other heart-valve replacement: Secondary | ICD-10-CM

## 2024-01-29 DIAGNOSIS — I059 Rheumatic mitral valve disease, unspecified: Secondary | ICD-10-CM

## 2024-01-29 DIAGNOSIS — I10 Essential (primary) hypertension: Secondary | ICD-10-CM | POA: Diagnosis not present

## 2024-01-29 MED ORDER — FUROSEMIDE 20 MG PO TABS: ORAL_TABLET | ORAL | 3 refills | Status: AC

## 2024-01-29 NOTE — Patient Instructions (Signed)
 Medication Instructions:  Start Lasix  20 mg if you gain more than 2 -3 pounds in a day or 5 pounds in a 1 week. *If you need a refill on your cardiac medications before your next appointment, please call your pharmacy*  Lab Work: None  If you have labs (blood work) drawn today and your tests are completely normal, you will receive your results only by: MyChart Message (if you have MyChart) OR A paper copy in the mail If you have any lab test that is abnormal or we need to change your treatment, we will call you to review the results.  Testing/Procedures: Echocardiogram Your physician has requested that you have an echocardiogram. Echocardiography is a painless test that uses sound waves to create images of your heart. It provides your doctor with information about the size and shape of your heart and how well your hearts chambers and valves are working. This procedure takes approximately one hour. There are no restrictions for this procedure. Please do NOT wear cologne, perfume, aftershave, or lotions (deodorant is allowed). Please arrive 15 minutes prior to your appointment time.  Please note: We ask at that you not bring children with you during ultrasound (echo/ vascular) testing. Due to room size and safety concerns, children are not allowed in the ultrasound rooms during exams. Our front office staff cannot provide observation of children in our lobby area while testing is being conducted. An adult accompanying a patient to their appointment will only be allowed in the ultrasound room at the discretion of the ultrasound technician under special circumstances. We apologize for any inconvenience.   Follow-Up: At [facility], you and your health needs are our priority.  As part of our continuing mission to provide you with exceptional heart care, our providers are all part of one team.  This team includes your primary Cardiologist (physician) and Advanced Practice Providers or APPs  (Physician Assistants and Nurse Practitioners) who all work together to provide you with the care you need, when you need it.  Your next appointment:   1 year(s)  Provider:   Oneil Parchment, MD    We recommend signing up for the patient portal called MyChart.  Sign up information is provided on this After Visit Summary.  MyChart is used to connect with patients for Virtual Visits (Telemedicine).  Patients are able to view lab/test results, encounter notes, upcoming appointments, etc.  Non-urgent messages can be sent to your provider as well.   To learn more about what you can do with MyChart, go to [email or web address].   Other Instructions None

## 2024-01-30 ENCOUNTER — Encounter: Payer: Self-pay | Admitting: Physician Assistant

## 2024-02-01 ENCOUNTER — Telehealth: Payer: Self-pay

## 2024-02-01 NOTE — Telephone Encounter (Signed)
 Hi Ms. Dyane.    Dr. Jeffrie got back to me about your fish oil. It is okay to take a low dose with your coumadin  (no more than 1g twice a day). You can get this OTC and it will help with your triglycerides.   Thanks! Orren LOISE Fabry, PA-C

## 2024-02-01 NOTE — Telephone Encounter (Signed)
 Contacted the patient bit no answer and unable to leave a message.   Left message on daughter voicemail as well with mychart recommendations. Advised a call back with any questions or concerns. Also advised to review MyChart with recommendations as well.

## 2024-02-05 ENCOUNTER — Ambulatory Visit: Admitting: Podiatry

## 2024-02-09 ENCOUNTER — Ambulatory Visit: Admitting: Podiatry

## 2024-02-09 DIAGNOSIS — M79674 Pain in right toe(s): Secondary | ICD-10-CM | POA: Diagnosis not present

## 2024-02-09 DIAGNOSIS — B351 Tinea unguium: Secondary | ICD-10-CM

## 2024-02-09 DIAGNOSIS — M79675 Pain in left toe(s): Secondary | ICD-10-CM | POA: Diagnosis not present

## 2024-02-09 NOTE — Progress Notes (Signed)
 Patient presents for evaluation and treatment of tenderness and some redness around nails feet.  Tenderness around toes with walking and wearing shoes.  Physical exam:  General appearance: Alert, pleasant, and in no acute distress.  Vascular: Pedal pulses: DP 1/4 B/L, PT 1/4 B/L.  Moderate edema lower legs bilaterally.  Capillary refill time immediate bilaterally  Neurologic:  Dermatologic:  Nails thickened, disfigured, discolored 1-5 BL with subungual debris.  Redness and hypertrophic nail folds along nail folds bilaterally but no signs of drainage or infection.  Musculoskeletal:     Diagnosis: 1. Painful onychomycotic nails 1 through 5 bilaterally. 2. Pain toes 1 through 5 bilaterally.  Plan: -Debrided onychomycotic nails 1 through 5 bilaterally.  Sharply debrided nails with nail clipper and reduced with a power bur.  Return 3 months [facility]

## 2024-03-07 ENCOUNTER — Ambulatory Visit (HOSPITAL_COMMUNITY)

## 2024-03-07 ENCOUNTER — Ambulatory Visit

## 2024-05-10 ENCOUNTER — Ambulatory Visit: Admitting: Podiatry

## 2024-05-17 ENCOUNTER — Inpatient Hospital Stay: Admitting: Gynecologic Oncology

## 2024-09-30 ENCOUNTER — Ambulatory Visit: Admitting: Adult Health
